# Patient Record
Sex: Female | Born: 1935 | Race: White | Hispanic: No | Smoking: Former smoker
Health system: Southern US, Community
[De-identification: ages and names within clinical notes are randomized; demographics above are authoritative.]

## PROBLEM LIST (undated history)

## (undated) DIAGNOSIS — F32A Depression, unspecified: Secondary | ICD-10-CM

## (undated) DIAGNOSIS — F329 Major depressive disorder, single episode, unspecified: Secondary | ICD-10-CM

## (undated) DIAGNOSIS — I471 Supraventricular tachycardia, unspecified: Secondary | ICD-10-CM

## (undated) DIAGNOSIS — I4719 Other supraventricular tachycardia: Secondary | ICD-10-CM

## (undated) DIAGNOSIS — F1021 Alcohol dependence, in remission: Secondary | ICD-10-CM

## (undated) DIAGNOSIS — I440 Atrioventricular block, first degree: Secondary | ICD-10-CM

## (undated) DIAGNOSIS — I219 Acute myocardial infarction, unspecified: Secondary | ICD-10-CM

## (undated) DIAGNOSIS — Q676 Pectus excavatum: Secondary | ICD-10-CM

## (undated) DIAGNOSIS — H353230 Exudative age-related macular degeneration, bilateral, stage unspecified: Secondary | ICD-10-CM

## (undated) DIAGNOSIS — Z9289 Personal history of other medical treatment: Secondary | ICD-10-CM

## (undated) DIAGNOSIS — M5136 Other intervertebral disc degeneration, lumbar region: Secondary | ICD-10-CM

## (undated) DIAGNOSIS — J449 Chronic obstructive pulmonary disease, unspecified: Secondary | ICD-10-CM

## (undated) DIAGNOSIS — E785 Hyperlipidemia, unspecified: Secondary | ICD-10-CM

## (undated) DIAGNOSIS — J309 Allergic rhinitis, unspecified: Secondary | ICD-10-CM

## (undated) DIAGNOSIS — D509 Iron deficiency anemia, unspecified: Secondary | ICD-10-CM

## (undated) DIAGNOSIS — I10 Essential (primary) hypertension: Secondary | ICD-10-CM

## (undated) DIAGNOSIS — R350 Frequency of micturition: Secondary | ICD-10-CM

## (undated) DIAGNOSIS — K449 Diaphragmatic hernia without obstruction or gangrene: Secondary | ICD-10-CM

## (undated) DIAGNOSIS — J189 Pneumonia, unspecified organism: Secondary | ICD-10-CM

## (undated) DIAGNOSIS — M1990 Unspecified osteoarthritis, unspecified site: Secondary | ICD-10-CM

## (undated) DIAGNOSIS — T7840XA Allergy, unspecified, initial encounter: Secondary | ICD-10-CM

## (undated) DIAGNOSIS — K219 Gastro-esophageal reflux disease without esophagitis: Secondary | ICD-10-CM

## (undated) DIAGNOSIS — Z86010 Personal history of colonic polyps: Secondary | ICD-10-CM

## (undated) DIAGNOSIS — H3530 Unspecified macular degeneration: Secondary | ICD-10-CM

## (undated) HISTORY — DX: Supraventricular tachycardia: I47.1

## (undated) HISTORY — DX: Personal history of colonic polyps: Z86.010

## (undated) HISTORY — DX: Chronic obstructive pulmonary disease, unspecified: J44.9

## (undated) HISTORY — DX: Other supraventricular tachycardia: I47.19

## (undated) HISTORY — DX: Gastro-esophageal reflux disease without esophagitis: K21.9

## (undated) HISTORY — PX: TONSILLECTOMY AND ADENOIDECTOMY: SUR1326

## (undated) HISTORY — PX: KNEE ARTHROSCOPY: SHX127

## (undated) HISTORY — DX: Pectus excavatum: Q67.6

## (undated) HISTORY — PX: CATARACT EXTRACTION W/ INTRAOCULAR LENS  IMPLANT, BILATERAL: SHX1307

## (undated) HISTORY — DX: Other intervertebral disc degeneration, lumbar region: M51.36

## (undated) HISTORY — DX: Acute myocardial infarction, unspecified: I21.9

## (undated) HISTORY — DX: Diaphragmatic hernia without obstruction or gangrene: K44.9

## (undated) HISTORY — PX: CARDIAC CATHETERIZATION: SHX172

## (undated) HISTORY — DX: Essential (primary) hypertension: I10

## (undated) HISTORY — DX: Iron deficiency anemia, unspecified: D50.9

## (undated) HISTORY — DX: Unspecified osteoarthritis, unspecified site: M19.90

## (undated) HISTORY — DX: Hyperlipidemia, unspecified: E78.5

## (undated) HISTORY — DX: Supraventricular tachycardia, unspecified: I47.10

## (undated) HISTORY — DX: Allergy, unspecified, initial encounter: T78.40XA

## (undated) HISTORY — DX: Allergic rhinitis, unspecified: J30.9

## (undated) HISTORY — DX: Atrioventricular block, first degree: I44.0

## (undated) HISTORY — DX: Alcohol dependence, in remission: F10.21

## (undated) HISTORY — DX: Unspecified macular degeneration: H35.30

---
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
## 1986-06-10 HISTORY — PX: ABDOMINAL HYSTERECTOMY: SHX81

## 1997-12-08 ENCOUNTER — Other Ambulatory Visit: Admission: RE | Admit: 1997-12-08 | Discharge: 1997-12-08 | Payer: Self-pay | Admitting: *Deleted

## 1997-12-14 ENCOUNTER — Other Ambulatory Visit: Admission: RE | Admit: 1997-12-14 | Discharge: 1997-12-14 | Payer: Self-pay | Admitting: Cardiology

## 1998-12-14 ENCOUNTER — Other Ambulatory Visit: Admission: RE | Admit: 1998-12-14 | Discharge: 1998-12-14 | Payer: Self-pay | Admitting: *Deleted

## 2000-01-07 ENCOUNTER — Other Ambulatory Visit: Admission: RE | Admit: 2000-01-07 | Discharge: 2000-01-07 | Payer: Self-pay | Admitting: *Deleted

## 2000-12-29 ENCOUNTER — Other Ambulatory Visit: Admission: RE | Admit: 2000-12-29 | Discharge: 2000-12-29 | Payer: Self-pay | Admitting: *Deleted

## 2003-01-17 HISTORY — PX: US ECHOCARDIOGRAPHY: HXRAD669

## 2005-03-06 ENCOUNTER — Ambulatory Visit: Payer: Self-pay | Admitting: Internal Medicine

## 2005-04-25 ENCOUNTER — Ambulatory Visit: Payer: Self-pay | Admitting: Internal Medicine

## 2005-05-09 ENCOUNTER — Ambulatory Visit: Payer: Self-pay | Admitting: Internal Medicine

## 2005-05-09 ENCOUNTER — Encounter (INDEPENDENT_AMBULATORY_CARE_PROVIDER_SITE_OTHER): Payer: Self-pay | Admitting: Specialist

## 2007-07-13 ENCOUNTER — Inpatient Hospital Stay (HOSPITAL_COMMUNITY): Admission: EM | Admit: 2007-07-13 | Discharge: 2007-07-15 | Payer: Self-pay | Admitting: Emergency Medicine

## 2009-01-26 ENCOUNTER — Encounter: Admission: RE | Admit: 2009-01-26 | Discharge: 2009-01-26 | Payer: Self-pay | Admitting: Cardiology

## 2009-03-08 HISTORY — PX: CARDIOVASCULAR STRESS TEST: SHX262

## 2010-03-12 ENCOUNTER — Ambulatory Visit: Payer: Self-pay | Admitting: Cardiology

## 2010-09-18 ENCOUNTER — Telehealth: Payer: Self-pay | Admitting: Cardiology

## 2010-09-18 MED ORDER — BUTABARBITAL SODIUM 30 MG PO TABS: 30.0000 mg | ORAL_TABLET | Freq: Three times a day (TID) | ORAL | Status: DC | PRN

## 2010-09-18 NOTE — Telephone Encounter (Signed)
Wants the number of pills in her prescription increased to 30 days. She said that the pharm.has only been giving her 14 pills. The med is butisol

## 2010-10-23 NOTE — H&P (Signed)
Alexandra Reyes, Alexandra Reyes              ACCOUNT NO.:  [id number]   MEDICAL RECORD NO.:  [id number]          PATIENT TYPE:  INP   LOCATION:  1413                         FACILITY:  [facility]   PHYSICIAN:  Cassell Clement, M.D. DATE OF BIRTH:  [birth year]   DATE OF ADMISSION:  07/13/2007  DATE OF DISCHARGE:                              HISTORY & PHYSICAL   CHIEF COMPLAINT:  Weakness, nausea and vomiting and diarrhea.   HISTORY:  This is a 77 year old Caucasian female who has been in  previously good health.  For the past week, she has had what she felt  was an upper respiratory infection with cough.  Then about 4 days ago,  she began experiencing diarrhea and over the weekend also had pernicious  nausea and vomiting.  She has had no fever.  She is not having any  sputum production.  She does have a past history of paroxysmal atrial  tachycardia.   HOME MEDS:  Are as follows:  1. Atenolol 25 mg b.i.d.  2. Lanoxin 0.125 mg daily.  3. Lasix 20 mg daily.  4. Fosamax 70 mg a week.  5. Prilosec 20 mg daily.  6. Simvastatin 20 mg at bedtime.   SOCIAL HISTORY:  Reveals that she is a widow.  She does not use alcohol  or tobacco.   FAMILY HISTORY:  Reveals that her father died at 52 of kidney failure.  Mother died of heart failure at 56.   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   PREVIOUS SURGERY:  Includes hysterectomy in 1988, and a lump of her  right breast in 1948, which was benign.   REVIEW OF SYSTEMS:  Otherwise unremarkable.  She does have a past  history of paroxysmal atrial tachycardia, which is quite infrequent.  Remainder of review of systems is negative in detail.  She has not  noticed any hematochezia or melena in association with the nausea,  vomiting and diarrhea.   PHYSICAL EXAMINATION:  VITAL SIGNS:  Her blood pressure is 154/76, pulse  of 62 regular, respirations are 22, O2 sat is 98%, temperature 96.5.  GENERAL APPEARANCE:  Reveals a weak-appearing woman in no acute  distress.  HEENT:  The pupils were equal and reactive.  Sclerae are clear.  The  jugular venous pressure is normal.  Carotids normal.  Thyroid normal.  CHEST:  Clear to percussion and auscultation.  HEART:  Reveals no murmur, gallop, rub or click.  ABDOMEN:  Soft.  Bowel sounds are present.  There is no  hepatosplenomegaly.  EXTREMITIES:  Showed no edema or phlebitis.  NEUROLOGIC:  Exam reveals that she is slightly slow in her thinking,  when compared usual but otherwise unchanged.   LAB WORK:  White count 11,900, hemoglobin 13.8, platelet count 475,000.  Serum sodium is 113, potassium 3.3, chloride 80, CO2 19, blood sugar  180, BUN 4, creatinine 0.65, calcium 8.4.  Digoxin level is less than  0.2.   Chest x-ray shows normal heart size include lung fields.  KUB is  unremarkable.   IMPRESSION:  1. Severe hyponatremia, probably secondary to pernicious nausea and      vomiting of uncertain etiology, probably secondary to viral      illness, rule out other causes of hyponatremia, such as      inappropriate secretion of a diuretic hormone.   DISPOSITION:  We are admitting her.  We are giving her IV saline.  We  are also going to replete her potassium, will get daily electrolytes.  We will have her on telemetry.  We will continue Lanoxin and will give  empiric Protonix.  Will hold her simvastatin for now.           ______________________________  Cassell Clement, M.D.     TB/MEDQ  D:  07/13/2007  T:  07/13/2007  Job:  [id number]

## 2010-10-23 NOTE — Discharge Summary (Signed)
Alexandra Reyes, Alexandra Reyes              ACCOUNT NO.:  [id number]   MEDICAL RECORD NO.:  [id number]          PATIENT TYPE:  INP   LOCATION:  1413                         FACILITY:  [facility]   PHYSICIAN:  Cassell Clement, M.D. DATE OF BIRTH:  05/02/1926   DATE OF ADMISSION:  07/13/2007  DATE OF DISCHARGE:  07/15/2007                               DISCHARGE SUMMARY   FINAL DIAGNOSES:  1. Severe hyponatremia.  2. Viral gastroenteritis with nausea and vomiting, resolved.  3. History of essential hypertension.  4. Hypercholesterolemia.  5. History of paroxysmal supraventricular tachycardia.   OPERATIONS PERFORMED:  None.   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  She  presented to the emergency room on the morning of July 13, 2007, and  was found to have a serum sodium of 113, potassium 3.3, chloride 80, CO2  19, blood sugar 180, BUN 4, creatinine 0.65, calcium 8.4.  Her digoxin  level was less than 0.2.  She had a history of having had a cough for  about a week and then began having diarrhea and nausea and vomiting over  the weekend, leaving her extremely weak and near-syncopal.   Physical exam on admission showed a blood pressure of 154/76, pulse of  62, regular, respirations 22, O2 saturation is 98%, temperature 96.5.  This is a weak-appearing woman in no acute distress.  HEENT:  Negative.  No scleral icterus.  LUNGS:  Clear.  HEART:  No murmur, gallop, rub or click.  ABDOMEN:  Soft bowel sounds.  No hepatosplenomegaly.  EXTREMITIES:  No edema or phlebitis.  NEUROLOGIC:  Her thinking is somewhat slow but otherwise no focal  findings.   Lab work was as noted above.  Her white count initially was 11,900,  hemoglobin 13.8.   HOSPITAL COURSE:  She was admitted to telemetry.  She was treated with  normal saline IV.  Her  potassium was also repleted.  She had no further  nausea and vomiting after admission.  By the day after admission her  serum sodium had increased from 113 to 130.  Potassium remained low and  she required further repletion.  Her activity was increased and she was  strong enough to walk in the hall and by July 15, 2007, sodium was up  to 131, potassium still low at 3.3, BUN was 4.  Blood pressure was  higher having been off Lasix and we are going to leave her off her  diuretics now because of her severe sodium depletion and we are going to  use Norvasc 5 mg daily to help with of blood pressure control.   The patient is being discharged on the following:  She will be on low-  sodium, heart-healthy diet.   She will be on:  1. Atenolol 25 mg twice a day.  2. Norvasc 5 mg daily for blood pressure.  3. K-Dur 20 mEq one twice a day.  4. Simvastatin 20 mg daily.  5. Aspirin 81 mg daily.  6. Lanoxin 0.125 mg daily.  7. Mucinex 600 mg twice a day p.r.n. for chest congestion.  8. Prilosec 20 mg daily.  9. Fosamax 70 mg weekly.  10.Iron 325 mg daily.  11.She is to hold off on daily Lasix but use it just p.r.n. for edema      or dyspnea.   She will be rechecked in 2 weeks and we will call her with an  appointment.  She will be seen for office visit and BMET.   CONDITION ON DISCHARGE:  Improved.           ______________________________  Cassell Clement, M.D.     TB/MEDQ  D:  07/15/2007  T:  07/15/2007  Job:  [id number]

## 2010-11-06 ENCOUNTER — Other Ambulatory Visit: Payer: Self-pay | Admitting: Cardiology

## 2010-11-07 NOTE — Telephone Encounter (Signed)
Med refill

## 2010-12-11 ENCOUNTER — Other Ambulatory Visit: Payer: Self-pay | Admitting: *Deleted

## 2010-12-11 DIAGNOSIS — F419 Anxiety disorder, unspecified: Secondary | ICD-10-CM

## 2010-12-11 NOTE — Telephone Encounter (Signed)
Refilled meds per fax request. Faxed signed rx back 

## 2010-12-16 MED ORDER — BUTABARBITAL SODIUM 30 MG PO TABS: 30.0000 mg | ORAL_TABLET | Freq: Every day | ORAL | Status: DC | PRN

## 2011-01-16 ENCOUNTER — Telehealth: Payer: Self-pay | Admitting: Cardiology

## 2011-01-16 NOTE — Telephone Encounter (Signed)
Wants to speak with RN before she schedules her 6 m f/u Appt. She thinks that she should have a CPE at the same time and wanted to ask you about that

## 2011-01-16 NOTE — Telephone Encounter (Signed)
Agree with plan 

## 2011-01-16 NOTE — Telephone Encounter (Signed)
Called patient and she has had increased tachycardia and a little more shortness of breath, has been drinking more tea.  Wanted to schedule her physical earlier than October.  Explained insurance would not cover before October, but that  Dr. Patty Sermons could see her before then for office visit.  She felt it was ok to wait until end of the month to be seen and scheduled appointment.  Advised to call back if worse.  Patient stated several times she was ok to wait and that she would call back if she needed to be seen before then.

## 2011-01-24 ENCOUNTER — Encounter: Payer: Self-pay | Admitting: Cardiology

## 2011-02-07 ENCOUNTER — Ambulatory Visit (INDEPENDENT_AMBULATORY_CARE_PROVIDER_SITE_OTHER): Payer: Medicare Other | Admitting: Cardiology

## 2011-02-07 ENCOUNTER — Ambulatory Visit
Admission: RE | Admit: 2011-02-07 | Discharge: 2011-02-07 | Disposition: A | Discharge status: Home / Self Care | Payer: Medicare Other | Source: Ambulatory Visit | Attending: Cardiology | Admitting: Cardiology

## 2011-02-07 ENCOUNTER — Encounter: Payer: Self-pay | Admitting: Cardiology

## 2011-02-07 ENCOUNTER — Other Ambulatory Visit: Payer: Self-pay | Admitting: *Deleted

## 2011-02-07 VITALS — BP 140/70 | HR 55 | Ht 63.5 in | Wt 150.6 lb

## 2011-02-07 DIAGNOSIS — E7800 Pure hypercholesterolemia, unspecified: Secondary | ICD-10-CM

## 2011-02-07 DIAGNOSIS — R0600 Dyspnea, unspecified: Secondary | ICD-10-CM

## 2011-02-07 DIAGNOSIS — R079 Chest pain, unspecified: Secondary | ICD-10-CM | POA: Insufficient documentation

## 2011-02-07 DIAGNOSIS — R0989 Other specified symptoms and signs involving the circulatory and respiratory systems: Secondary | ICD-10-CM

## 2011-02-07 DIAGNOSIS — I471 Supraventricular tachycardia, unspecified: Secondary | ICD-10-CM

## 2011-02-07 DIAGNOSIS — M1990 Unspecified osteoarthritis, unspecified site: Secondary | ICD-10-CM

## 2011-02-07 DIAGNOSIS — L299 Pruritus, unspecified: Secondary | ICD-10-CM

## 2011-02-07 LAB — BASIC METABOLIC PANEL
BUN: 11 mg/dL (ref 6–23)
CO2: 27 mEq/L (ref 19–32)
Chloride: 98 mEq/L (ref 96–112)
Creatinine, Ser: 0.5 mg/dL (ref 0.4–1.2)
Potassium: 5.5 mEq/L — ABNORMAL HIGH (ref 3.5–5.1)

## 2011-02-07 MED ORDER — SIMVASTATIN 20 MG PO TABS: 20.0000 mg | ORAL_TABLET | Freq: Every day | ORAL | Status: DC

## 2011-02-07 MED ORDER — HYDROCORTISONE VALERATE 0.2 % EX CREA: TOPICAL_CREAM | Freq: Two times a day (BID) | CUTANEOUS | Status: AC

## 2011-02-07 MED ORDER — FUROSEMIDE 20 MG PO TABS: 20.0000 mg | ORAL_TABLET | ORAL | Status: DC | PRN

## 2011-02-07 NOTE — Progress Notes (Signed)
Alexandra Reyes Date of Birth:  02/24/1926 [facility] [address]   [address] [place], [place]  [address] [phone]           Fax   [phone]  History of Present Illness: This pleasant 77 year old woman is seen for a work in office visit.  She has been concerned about herself.  She had an episode of prolonged supraventricular tachycardia several weeks ago.  She estimated her heart rate at about 180 and it lasted for several hours during the night.  It was associated with some aching of her left arm and some substernal chest tightness.  She does not have any history of known ischemic heart disease.  She had similar symptoms in 2010 and had a normal treadmill Cardiolite stress test in September 2010.  Her ejection fraction was 84%.  She's had a history of rapid weight gain and weight loss.  She has been using furosemide 20 mg on a p.r.n. Basis for fluid retention.  She has felt short of breath with exertion.  Her last echocardiogram was in 2004.  Current Outpatient Prescriptions  Medication Sig Dispense Refill  . amLODipine (NORVASC) 2.5 MG tablet Take 2.5 mg by mouth daily.        . Ascorbic Acid (VITAMIN C PO) Take by mouth.        Marland Kitchen aspirin 81 MG tablet Take 81 mg by mouth daily.        Marland Kitchen atenolol (TENORMIN) 25 MG tablet TAKE ONE TABLET BY MOUTH TWICE DAILY  100 tablet  PRN  . B Complex Vitamins (VITAMIN B COMPLEX PO) Take by mouth daily.        . butabarbital (BUTISOL) 30 MG tablet Take 30 mg by mouth 3 (three) times daily as needed.        . calcium carbonate (CALCIUM 600) 600 MG TABS Take 600 mg by mouth daily.        . digoxin (LANOXIN) 0.125 MG tablet Take 125 mcg by mouth daily.        . ferrous sulfate 325 (65 FE) MG tablet Take 325 mg by mouth daily with breakfast.        . ibuprofen (ADVIL,MOTRIN) 200 MG tablet Take 200 mg by mouth every 6 (six) hours as needed.        . simvastatin (ZOCOR) 20 MG tablet Take 1 tablet (20 mg total) by mouth at  bedtime.  90 tablet  3  . furosemide (LASIX) 20 MG tablet Take 1 tablet (20 mg total) by mouth as needed.  90 tablet  3  . hydrocortisone (WESTCORT) 0.2 % cream Apply topically 2 (two) times daily.  45 g  1  . Multiple Vitamins-Minerals (ICAPS PO) Take by mouth daily.          Allergies  Allergen Reactions  . Lisinopril     There is no problem list on file for this patient.   History  Smoking status  . Former Smoker  . Quit date: 01/24/2003  Smokeless tobacco  . Not on file    History  Alcohol Use No    Family History  Problem Relation Age of Onset  . Heart failure Mother   . Arthritis Mother   . Kidney failure Father   . Heart disease Father   . Hypertension Father     Review of Systems: Constitutional: no fever chills diaphoresis or fatigue or change in weight.  Head and neck: no hearing loss, no epistaxis, no  photophobia or visual disturbance. Respiratory: No cough, shortness of breath or wheezing. Cardiovascular: No chest pain peripheral edema, palpitations. Gastrointestinal: No abdominal distention, no abdominal pain, no change in bowel habits hematochezia or melena. Genitourinary: No dysuria, no frequency, no urgency, no nocturia. Musculoskeletal:No arthralgias, no back pain, no gait disturbance or myalgias. Neurological: No dizziness, no headaches, no numbness, no seizures, no syncope, no weakness, no tremors. Hematologic: No lymphadenopathy, no easy bruising. Psychiatric: No confusion, no hallucinations, no sleep disturbance.    Physical Exam: Filed Vitals:   02/07/11 1027  BP: 140/70  Pulse: 55  The general appearance reveals a well-developed well-nourished woman in no distress.Pupils equal and reactive.   Extraocular Movements are full.  There is no scleral icterus.  The mouth and pharynx are normal.  The neck is supple.  The carotids reveal no bruits.  The jugular venous pressure is normal.  The thyroid is not enlarged.  There is no  lymphadenopathy.  The chest is clear to percussion and auscultation. There are no rales or rhonchi. Expansion of the chest is symmetrical.  The precordium is quiet.  The first heart sound is normal.  The second heart sound is physiologically split.  There is no murmur gallop rub or click.  There is no abnormal lift or heave.  The abdomen is soft and nontender. Bowel sounds are normal. The liver and spleen are not enlarged. There Are no abdominal masses. There are no bruits.  The pedal pulses are good.  There is no phlebitis or edema.  There is no cyanosis or clubbing.  Strength is normal and symmetrical in all extremities.  There is no lateralizing weakness.  There are no sensory deficits.  The skin is warm and dry.  There is no rash.     Assessment / Plan: We are going to have her start taking her furosemide 20 mg every day instead of just p.r.n. To see if we can limit her wide shifts and fluid retention and decrease her dyspnea.  Her chest x-ray today is unremarkable.  We're sending her to the lab for blood work including a B. Natruretic peptide and a basal metabolic panel to look at potassium and a digoxin level to see if we could possibly increase her maintenance digoxin dose and help try to decrease the frequency of her episodes of tachycardia.  If her potassium is borderline low we will want to add potassium tablets to her regimen particularly if we go up on her digoxin dose.   She Will be rechecked in 3 months for followup office visit and lab work

## 2011-02-07 NOTE — Assessment & Plan Note (Signed)
The patient has had some chest tightness in association with her tachycardia and sometimes when she's not having tachycardia.  She does not have known ischemic heart disease and she had a normal treadmill Cardiolite stress test less than 2 years ago.

## 2011-02-07 NOTE — Assessment & Plan Note (Signed)
The patient has been more aware of shortness of breath and retention of fluid requiring Lasix.  She does not have any history of Ingested heart failure.  We're checking a basal natruretic peptide today as well as a chest x-ray.  The chest x-ray report has already returned and shows normal heart size and no CHF.

## 2011-02-08 ENCOUNTER — Telehealth: Payer: Self-pay | Admitting: *Deleted

## 2011-02-08 LAB — DIGOXIN LEVEL: Digoxin Level: 0.8 ng/mL (ref 0.8–2.0)

## 2011-02-08 NOTE — Telephone Encounter (Signed)
Message copied by Eugenia Pancoast on Fri Feb 08, 2011  5:23 PM ------      Message from: Cassell Clement      Created: Thu Feb 07, 2011  5:43 PM       The chest x-ray was normal

## 2011-02-08 NOTE — Telephone Encounter (Signed)
Advised of CXR resiults

## 2011-02-13 ENCOUNTER — Other Ambulatory Visit: Payer: Self-pay | Admitting: *Deleted

## 2011-02-13 DIAGNOSIS — I119 Hypertensive heart disease without heart failure: Secondary | ICD-10-CM

## 2011-02-13 DIAGNOSIS — R0600 Dyspnea, unspecified: Secondary | ICD-10-CM

## 2011-02-13 MED ORDER — FUROSEMIDE 20 MG PO TABS: 20.0000 mg | ORAL_TABLET | Freq: Every day | ORAL | Status: DC

## 2011-02-19 ENCOUNTER — Telehealth: Payer: Self-pay | Admitting: Cardiology

## 2011-02-19 ENCOUNTER — Telehealth: Payer: Self-pay | Admitting: *Deleted

## 2011-02-19 DIAGNOSIS — Z79899 Other long term (current) drug therapy: Secondary | ICD-10-CM

## 2011-02-19 NOTE — Telephone Encounter (Signed)
Advised of increase, will recheck at next ov

## 2011-02-19 NOTE — Progress Notes (Signed)
Advised of increase

## 2011-02-19 NOTE — Telephone Encounter (Signed)
Advised of labs 

## 2011-02-19 NOTE — Progress Notes (Signed)
Left message

## 2011-02-19 NOTE — Telephone Encounter (Signed)
Returning call back to nurse.  

## 2011-02-19 NOTE — Telephone Encounter (Signed)
Message copied by Burnell Blanks on Tue Feb 19, 2011  2:40 PM ------      Message from: Cassell Clement      Created: Sun Feb 17, 2011  8:10 AM       Please report to the patient.  The digoxin level is borderline low and I want the patient to increase her digoxin to 2 tablets on Monday, Wednesday, and Friday, and one tablet on the other days.  Hopefully, this . will decrease her episodes of supraventricular tachycardia.

## 2011-02-25 ENCOUNTER — Other Ambulatory Visit (INDEPENDENT_AMBULATORY_CARE_PROVIDER_SITE_OTHER): Payer: Medicare Other | Admitting: *Deleted

## 2011-02-25 DIAGNOSIS — E7800 Pure hypercholesterolemia, unspecified: Secondary | ICD-10-CM

## 2011-02-25 DIAGNOSIS — I119 Hypertensive heart disease without heart failure: Secondary | ICD-10-CM

## 2011-02-25 LAB — LIPID PANEL
HDL: 57.2 mg/dL (ref >39.00)
LDL Cholesterol: 106 mg/dL — ABNORMAL HIGH (ref 0–99)
Total CHOL/HDL Ratio: 3
Triglycerides: 135 mg/dL (ref 0.0–149.0)

## 2011-02-25 LAB — BASIC METABOLIC PANEL
CO2: 25 mEq/L (ref 19–32)
Calcium: 9 mg/dL (ref 8.4–10.5)
Chloride: 97 mEq/L (ref 96–112)
Glucose, Bld: 100 mg/dL — ABNORMAL HIGH (ref 70–99)
Sodium: 130 mEq/L — ABNORMAL LOW (ref 135–145)

## 2011-02-25 LAB — HEPATIC FUNCTION PANEL
Albumin: 4 g/dL (ref 3.5–5.2)
Total Bilirubin: 0.5 mg/dL (ref 0.3–1.2)

## 2011-02-27 ENCOUNTER — Telehealth: Payer: Self-pay | Admitting: *Deleted

## 2011-02-27 NOTE — Telephone Encounter (Signed)
Message copied by Burnell Blanks on Wed Feb 27, 2011  3:57 PM ------      Message from: Cassell Clement      Created: Tue Feb 26, 2011  3:00 PM       Please report.  The electrolytes are stable except a sodium is slightly low.  The potassium is fine.  The liver function studies are normal.  The LDL was borderline elevated at 106 And the triglycerides are normal.  Stay on present medication.

## 2011-02-27 NOTE — Telephone Encounter (Signed)
Message copied by Burnell Blanks on Wed Feb 27, 2011  3:59 PM ------      Message from: Cassell Clement      Created: Tue Feb 26, 2011  3:00 PM       Please report.  The electrolytes are stable except a sodium is slightly low.  The potassium is fine.  The liver function studies are normal.  The LDL was borderline elevated at 106 And the triglycerides are normal.  Stay on present medication.

## 2011-02-27 NOTE — Telephone Encounter (Signed)
Advised of labs 

## 2011-02-27 NOTE — Progress Notes (Signed)
Advised of labs 

## 2011-03-01 LAB — DIFFERENTIAL
Basophils Relative: 0
Eosinophils Absolute: 0
Eosinophils Relative: 0
Lymphs Abs: 1.6
Monocytes Absolute: 0.6
Monocytes Relative: 5
Neutrophils Relative %: 82 — ABNORMAL HIGH

## 2011-03-01 LAB — BASIC METABOLIC PANEL
BUN: 3 — ABNORMAL LOW
BUN: 4 — ABNORMAL LOW
CO2: 19
Calcium: 8.1 — ABNORMAL LOW
Chloride: 101
Chloride: 80 — ABNORMAL LOW
Creatinine, Ser: 0.5
Creatinine, Ser: 0.6
GFR calc Af Amer: >60
GFR calc non Af Amer: >60
GFR calc non Af Amer: >60
Glucose, Bld: 98
Potassium: 3.3 — ABNORMAL LOW
Potassium: 3.3 — ABNORMAL LOW
Sodium: 113 — CL

## 2011-03-01 LAB — SODIUM, URINE, RANDOM: Sodium, Ur: 45

## 2011-03-01 LAB — COMPREHENSIVE METABOLIC PANEL
ALT: 21
Albumin: 3.7
Alkaline Phosphatase: 70
GFR calc Af Amer: >60
Potassium: 3.6
Sodium: 114 — CL
Total Protein: 6.7

## 2011-03-01 LAB — URINALYSIS, ROUTINE W REFLEX MICROSCOPIC
Ketones, ur: >80 — AB
Nitrite: NEGATIVE
Protein, ur: 100 — AB
pH: 7.5

## 2011-03-01 LAB — CBC
HCT: 32.8 — ABNORMAL LOW
HCT: 39.5
Hemoglobin: 13.8
MCHC: 35
MCV: 84.2
MCV: 84.9
Platelets: 418 — ABNORMAL HIGH
Platelets: 437 — ABNORMAL HIGH
RBC: 4.69
RDW: 13.2
RDW: 13.4
WBC: 11.9 — ABNORMAL HIGH
WBC: 7.9
WBC: 8.2

## 2011-03-01 LAB — NA AND K (SODIUM & POTASSIUM), RAND UR: Potassium Urine: 34

## 2011-03-01 LAB — B-NATRIURETIC PEPTIDE (CONVERTED LAB): Pro B Natriuretic peptide (BNP): 88.4

## 2011-03-01 LAB — TSH: TSH: 2.32

## 2011-03-01 LAB — AMYLASE: Amylase: 27

## 2011-03-01 LAB — PROTIME-INR
INR: 1
Prothrombin Time: 13.1

## 2011-05-15 ENCOUNTER — Encounter: Payer: Self-pay | Admitting: Cardiology

## 2011-05-15 ENCOUNTER — Ambulatory Visit (INDEPENDENT_AMBULATORY_CARE_PROVIDER_SITE_OTHER): Payer: Medicare Other | Admitting: Cardiology

## 2011-05-15 ENCOUNTER — Other Ambulatory Visit: Payer: Medicare Other | Admitting: *Deleted

## 2011-05-15 VITALS — BP 140/78 | HR 60 | Ht 64.0 in | Wt 150.0 lb

## 2011-05-15 DIAGNOSIS — Z79899 Other long term (current) drug therapy: Secondary | ICD-10-CM

## 2011-05-15 DIAGNOSIS — R079 Chest pain, unspecified: Secondary | ICD-10-CM

## 2011-05-15 DIAGNOSIS — R0600 Dyspnea, unspecified: Secondary | ICD-10-CM

## 2011-05-15 DIAGNOSIS — I471 Supraventricular tachycardia: Secondary | ICD-10-CM

## 2011-05-15 DIAGNOSIS — R0609 Other forms of dyspnea: Secondary | ICD-10-CM

## 2011-05-15 DIAGNOSIS — E7800 Pure hypercholesterolemia, unspecified: Secondary | ICD-10-CM

## 2011-05-15 LAB — LIPID PANEL
Cholesterol: 212 mg/dL — ABNORMAL HIGH (ref 0–200)
HDL: 57.5 mg/dL (ref >39.00)
Triglycerides: 139 mg/dL (ref 0.0–149.0)
VLDL: 27.8 mg/dL (ref 0.0–40.0)

## 2011-05-15 LAB — BASIC METABOLIC PANEL
Chloride: 100 mEq/L (ref 96–112)
GFR: 84.9 mL/min (ref >60.00)
Potassium: 4.2 mEq/L (ref 3.5–5.1)
Sodium: 135 mEq/L (ref 135–145)

## 2011-05-15 LAB — HEPATIC FUNCTION PANEL
ALT: 20 U/L (ref 0–35)
Albumin: 3.8 g/dL (ref 3.5–5.2)
Total Protein: 6.4 g/dL (ref 6.0–8.3)

## 2011-05-15 NOTE — Assessment & Plan Note (Signed)
Patient has not had any chest pain or angina.

## 2011-05-15 NOTE — Patient Instructions (Signed)
Your physician recommends that you continue on your current medications as directed. Please refer to the Current Medication list given to you today.  Your physician wants you to follow-up in: 6 months. You will receive a reminder letter in the mail two months in advance. If you don't receive a letter, please call our office to schedule the follow-up appointment.  

## 2011-05-15 NOTE — Assessment & Plan Note (Signed)
The patient has a past history of exertional dyspnea secondary to diastolic dysfunction.  This has improved on daily Lasix.

## 2011-05-15 NOTE — Progress Notes (Signed)
Alexandra Reyes Date of Birth:  January 17, 1926 [facility] [address]   [address] [place], [place]  [address] [phone]           Fax   [phone]  History of Present Illness: This pleasant 77 year old woman is seen for a scheduled followup office visit.  She has a past history of paroxysmal supraventricular tachycardia.  I have any history of ischemic heart disease.  She had a normal treadmill Cardiolite stress test in September 2010.  She has not had cardiac catheterization.  Her ejection fraction at the time of her stress test was 84%.  Her last echocardiogram was in 2004 at the time of her echocardiogram in 2004 she had diastolic dysfunction and she had trace mitral regurgitation without definite findings of mitral valve prolapse.  Since last visit she's been doing well.  She has brief episodes of SVT approximately twice a week.  These usually occur at night awakening her from sleep.  If she sits up and burps or coughs hard she can breakthrough tachycardia.  The tachycardia will sometimes be brought on by stress or anxiety and so she takes a Butisol before she goes to church or before she would be in a crowd.  Current Outpatient Prescriptions  Medication Sig Dispense Refill  . amLODipine (NORVASC) 2.5 MG tablet Take 2.5 mg by mouth daily.        . Ascorbic Acid (VITAMIN C PO) Take by mouth.        Marland Kitchen aspirin 81 MG tablet Take 81 mg by mouth daily.        Marland Kitchen atenolol (TENORMIN) 25 MG tablet TAKE ONE TABLET BY MOUTH TWICE DAILY  100 tablet  PRN  . B Complex Vitamins (VITAMIN B COMPLEX PO) Take by mouth daily.        . butabarbital (BUTISOL) 30 MG tablet Take 30 mg by mouth 3 (three) times daily as needed.        . calcium carbonate (CALCIUM 600) 600 MG TABS Take 600 mg by mouth daily.        . digoxin (LANOXIN) 0.125 MG tablet Take 125 mcg by mouth daily. Except 2 on Mon, Wed, & Fri      . ferrous sulfate 325 (65 FE) MG tablet Take 325 mg by mouth daily with  breakfast.        . furosemide (LASIX) 20 MG tablet Take 1 tablet (20 mg total) by mouth daily.  90 tablet  0  . hydrocortisone (WESTCORT) 0.2 % cream Apply topically 2 (two) times daily.  45 g  1  . ibuprofen (ADVIL,MOTRIN) 200 MG tablet Take 200 mg by mouth every 6 (six) hours as needed.        . simvastatin (ZOCOR) 20 MG tablet Take 1 tablet (20 mg total) by mouth at bedtime.  90 tablet  3    Allergies  Allergen Reactions  . Lisinopril     Patient Active Problem List  Diagnoses  . Paroxysmal SVT (supraventricular tachycardia)  . Dyspnea  . Chest pain    History  Smoking status  . Former Smoker  . Quit date: 01/24/2003  Smokeless tobacco  . Not on file    History  Alcohol Use No    Family History  Problem Relation Age of Onset  . Heart failure Mother   . Arthritis Mother   . Kidney failure Father   . Heart disease Father   . Hypertension Father     Review  of Systems: Constitutional: no fever chills diaphoresis or fatigue or change in weight.  Head and neck: no hearing loss, no epistaxis, no photophobia or visual disturbance. Respiratory: No cough, shortness of breath or wheezing. Cardiovascular: No chest pain peripheral edema, palpitations. Gastrointestinal: No abdominal distention, no abdominal pain, no change in bowel habits hematochezia or melena. Genitourinary: No dysuria, no frequency, no urgency, no nocturia. Musculoskeletal:No arthralgias, no back pain, no gait disturbance or myalgias. Neurological: No dizziness, no headaches, no numbness, no seizures, no syncope, no weakness, no tremors. Hematologic: No lymphadenopathy, no easy bruising. Psychiatric: No confusion, no hallucinations, no sleep disturbance.    Physical Exam: Filed Vitals:   05/15/11 0929  BP: 140/78  Pulse: 60   general appearance reveals a well-developed well-nourished woman in no distress.Pupils equal and reactive.   Extraocular Movements are full.  There is no scleral icterus.  The  mouth and pharynx are normal.  The neck is supple.  The carotids reveal no bruits.  The jugular venous pressure is normal.  The thyroid is not enlarged.  There is no lymphadenopathy.  The head and neck exam reveals pupils equal and reactive.  Extraocular movements are full.  There is no scleral icterus.  The mouth and pharynx are normal.  The neck is supple.  The carotids reveal no bruits.  The jugular venous pressure is normal.  The  thyroid is not enlarged.  There is no lymphadenopathy.  The chest is clear to percussion and auscultation.  There are no rales or rhonchi.  Expansion of the chest is symmetrical.  The precordium is quiet.  The first heart sound is normal.  The second heart sound is physiologically split.  There is no murmur gallop rub or click.  There is no abnormal lift or heave.  The abdomen is soft and nontender.  The bowel sounds are normal.  The liver and spleen are not enlarged.  There are no abdominal masses.  There are no abdominal bruits.  Extremities reveal good pedal pulses.  There is no phlebitis or edema.  There is no cyanosis or clubbing.  Strength is normal and symmetrical in all extremities.  There is no lateralizing weakness.  There are no sensory deficits.  The skin is warm and dry.  There is no rash.     Assessment / Plan: Continue same medication and be rechecked in 6 months for followup office visit EKG fasting lipid panel hepatic function panel and basal metabolic panel.  Will also be establishing primary care relationship with Dr. Oliver Barre.

## 2011-05-15 NOTE — Assessment & Plan Note (Signed)
The patient is experiencing brief episodes about twice a week.  They're not associated with any chest pain or discomfort.

## 2011-05-16 ENCOUNTER — Telehealth: Payer: Self-pay | Admitting: *Deleted

## 2011-05-16 ENCOUNTER — Telehealth: Payer: Self-pay | Admitting: Cardiology

## 2011-05-16 LAB — DIGOXIN LEVEL: Digoxin Level: 2.1 ng/mL — ABNORMAL HIGH (ref 0.8–2.0)

## 2011-05-16 NOTE — Telephone Encounter (Signed)
New msg Pt was calling about test results she received letter in the mail

## 2011-05-16 NOTE — Telephone Encounter (Signed)
Advised of lab results 

## 2011-05-16 NOTE — Telephone Encounter (Signed)
Advised of labs 

## 2011-05-16 NOTE — Telephone Encounter (Signed)
Message copied by Burnell Blanks on Thu May 16, 2011  2:16 PM ------      Message from: Cassell Clement      Created: Thu May 16, 2011  1:26 PM       Please report.  The digoxin level is too high so decrease digoxin to one every day except 2 on Mondays only

## 2011-05-16 NOTE — Telephone Encounter (Signed)
Advised and mailed to patient 

## 2011-05-16 NOTE — Telephone Encounter (Signed)
Message copied by Burnell Blanks on Thu May 16, 2011  9:28 AM ------      Message from: Cassell Clement      Created: Wed May 15, 2011  8:44 PM       Please report.  The labs are stable.  Continue same meds.  Continue careful diet. Cholesterol still slightly high.

## 2011-05-18 ENCOUNTER — Encounter: Payer: Self-pay | Admitting: Internal Medicine

## 2011-05-18 DIAGNOSIS — M1990 Unspecified osteoarthritis, unspecified site: Secondary | ICD-10-CM | POA: Insufficient documentation

## 2011-05-18 DIAGNOSIS — K219 Gastro-esophageal reflux disease without esophagitis: Secondary | ICD-10-CM | POA: Insufficient documentation

## 2011-05-18 DIAGNOSIS — J449 Chronic obstructive pulmonary disease, unspecified: Secondary | ICD-10-CM | POA: Insufficient documentation

## 2011-05-18 DIAGNOSIS — M5136 Other intervertebral disc degeneration, lumbar region: Secondary | ICD-10-CM

## 2011-05-18 DIAGNOSIS — Z0000 Encounter for general adult medical examination without abnormal findings: Secondary | ICD-10-CM | POA: Insufficient documentation

## 2011-05-18 DIAGNOSIS — M51369 Other intervertebral disc degeneration, lumbar region without mention of lumbar back pain or lower extremity pain: Secondary | ICD-10-CM

## 2011-05-18 DIAGNOSIS — H3530 Unspecified macular degeneration: Secondary | ICD-10-CM | POA: Insufficient documentation

## 2011-05-18 DIAGNOSIS — E785 Hyperlipidemia, unspecified: Secondary | ICD-10-CM | POA: Insufficient documentation

## 2011-05-18 DIAGNOSIS — M810 Age-related osteoporosis without current pathological fracture: Secondary | ICD-10-CM | POA: Insufficient documentation

## 2011-05-18 DIAGNOSIS — D509 Iron deficiency anemia, unspecified: Secondary | ICD-10-CM

## 2011-05-18 DIAGNOSIS — K8020 Calculus of gallbladder without cholecystitis without obstruction: Secondary | ICD-10-CM | POA: Insufficient documentation

## 2011-05-18 DIAGNOSIS — Q676 Pectus excavatum: Secondary | ICD-10-CM | POA: Insufficient documentation

## 2011-05-18 DIAGNOSIS — I10 Essential (primary) hypertension: Secondary | ICD-10-CM | POA: Insufficient documentation

## 2011-05-18 HISTORY — DX: Pectus excavatum: Q67.6

## 2011-05-18 HISTORY — DX: Essential (primary) hypertension: I10

## 2011-05-18 HISTORY — DX: Hyperlipidemia, unspecified: E78.5

## 2011-05-18 HISTORY — DX: Other intervertebral disc degeneration, lumbar region: M51.36

## 2011-05-18 HISTORY — DX: Other intervertebral disc degeneration, lumbar region without mention of lumbar back pain or lower extremity pain: M51.369

## 2011-05-18 HISTORY — DX: Iron deficiency anemia, unspecified: D50.9

## 2011-05-22 ENCOUNTER — Encounter: Payer: Self-pay | Admitting: Internal Medicine

## 2011-05-22 ENCOUNTER — Other Ambulatory Visit (INDEPENDENT_AMBULATORY_CARE_PROVIDER_SITE_OTHER): Payer: Medicare Other

## 2011-05-22 ENCOUNTER — Ambulatory Visit (INDEPENDENT_AMBULATORY_CARE_PROVIDER_SITE_OTHER): Payer: Medicare Other | Admitting: Internal Medicine

## 2011-05-22 VITALS — BP 152/84 | HR 67 | Temp 97.4°F | Ht 64.0 in | Wt 152.5 lb

## 2011-05-22 DIAGNOSIS — D509 Iron deficiency anemia, unspecified: Secondary | ICD-10-CM

## 2011-05-22 DIAGNOSIS — R5381 Other malaise: Secondary | ICD-10-CM

## 2011-05-22 DIAGNOSIS — F329 Major depressive disorder, single episode, unspecified: Secondary | ICD-10-CM

## 2011-05-22 DIAGNOSIS — I10 Essential (primary) hypertension: Secondary | ICD-10-CM

## 2011-05-22 DIAGNOSIS — J309 Allergic rhinitis, unspecified: Secondary | ICD-10-CM | POA: Insufficient documentation

## 2011-05-22 DIAGNOSIS — R5383 Other fatigue: Secondary | ICD-10-CM

## 2011-05-22 DIAGNOSIS — F1021 Alcohol dependence, in remission: Secondary | ICD-10-CM | POA: Insufficient documentation

## 2011-05-22 HISTORY — DX: Allergic rhinitis, unspecified: J30.9

## 2011-05-22 HISTORY — DX: Alcohol dependence, in remission: F10.21

## 2011-05-22 LAB — CBC WITH DIFFERENTIAL/PLATELET
Basophils Absolute: 0.1 10*3/uL (ref 0.0–0.1)
HCT: 40.5 % (ref 36.0–46.0)
Hemoglobin: 13.9 g/dL (ref 12.0–15.0)
Lymphs Abs: 2 10*3/uL (ref 0.7–4.0)
MCHC: 34.3 g/dL (ref 30.0–36.0)
Monocytes Relative: 10.3 % (ref 3.0–12.0)
Neutro Abs: 5.5 10*3/uL (ref 1.4–7.7)
RDW: 14 % (ref 11.5–14.6)

## 2011-05-22 NOTE — Progress Notes (Signed)
Subjective:    Patient ID: Alexandra Reyes, female    DOB: 11/19/1925, 77 y.o.   MRN: [id number]  HPI  Here to establish as new pt;  Overall doing ok;  Pt denies CP, worsening SOB, DOE, wheezing, orthopnea, PND, worsening LE edema, palpitations, dizziness or syncope.  Pt denies neurological change such as new Headache, facial or extremity weakness.  Pt denies polydipsia, polyuria, or low sugar symptoms. Pt states overall good compliance with treatment and medications, good tolerability, and trying to follow lower cholesterol diet.  Pt denies worsening depressive symptoms, suicidal ideation or panic. No fever, wt loss, night sweats, loss of appetite, or other constitutional symptoms.  Pt states good ability with ADL's, low fall risk, home safety reviewed and adequate, no significant changes in hearing or vision, and occasionally active with exercise. BP usually 140 or less at home.  Denies worsening depressive symptoms, suicidal ideation, or panic. Does have sense of ongoing fatigue, but denies signficant hypersomnolence.  Does have several wks ongoing nasal allergy symptoms with clear congestion, itch and sneeze, without fever, pain, ST, cough or wheezing.  No overt bleeding or bruising. Past Medical History  Diagnosis Date  . PAT (paroxysmal atrial tachycardia)   . DOE (dyspnea on exertion)   . Chest pressure   . Macular degeneration   . OA (osteoarthritis)   . Hiatal hernia   . GERD (gastroesophageal reflux disease)   . Cholelithiasis   . Dyspnea   . COPD (chronic obstructive pulmonary disease)   . HTN (hypertension) 05/18/2011  . Hyperlipidemia 05/18/2011  . Osteoporosis 05/18/2011  . Anemia, iron deficiency 05/18/2011  . DDD (degenerative disc disease), lumbar 05/18/2011  . Pectus excavatum 05/18/2011  . Allergy   . Allergic rhinitis, cause unspecified 05/22/2011  . Alcohol dependence in remission 05/22/2011   Past Surgical History  Procedure Date  . Tonsillectomy and adenoidectomy   .  Breast lumpectomy   . US echocardiography 01/17/2003    EF 55-60%  . Cardiovascular stress test 03/08/2009    EF 84%  . Abdominal hysterectomy 1988  . Breast biopsy 1948    reports that she quit smoking about 8 years ago. She does not have any smokeless tobacco history on file. She reports that she does not drink alcohol or use illicit drugs. family history includes Arthritis in her mother; Heart disease in her father; Heart failure in her mother; Hypertension in her father; and Kidney failure in her father. Allergies  Allergen Reactions  . Lisinopril    Current Outpatient Prescriptions on File Prior to Visit  Medication Sig Dispense Refill  . amLODipine (NORVASC) 2.5 MG tablet Take 2.5 mg by mouth daily.        . Ascorbic Acid (VITAMIN C PO) Take by mouth.        Marland Kitchen aspirin 81 MG tablet Take 81 mg by mouth daily.        Marland Kitchen atenolol (TENORMIN) 25 MG tablet TAKE ONE TABLET BY MOUTH TWICE DAILY  100 tablet  PRN  . B Complex Vitamins (VITAMIN B COMPLEX PO) Take by mouth daily.        . butabarbital (BUTISOL) 30 MG tablet Take 30 mg by mouth 3 (three) times daily as needed.        . calcium carbonate (CALCIUM 600) 600 MG TABS Take 600 mg by mouth daily.        . digoxin (LANOXIN) 0.125 MG tablet Take 125 mcg by mouth daily. Except 2 on Mon, Wed, & Fri      .  ferrous sulfate 325 (65 FE) MG tablet Take 325 mg by mouth daily with breakfast.        . furosemide (LASIX) 20 MG tablet Take 1 tablet (20 mg total) by mouth daily.  90 tablet  0  . hydrocortisone (WESTCORT) 0.2 % cream Apply topically 2 (two) times daily.  45 g  1  . ibuprofen (ADVIL,MOTRIN) 200 MG tablet Take 200 mg by mouth every 6 (six) hours as needed.        . simvastatin (ZOCOR) 20 MG tablet Take 1 tablet (20 mg total) by mouth at bedtime.  90 tablet  3   Review of Systems Review of Systems  Constitutional: Negative for diaphoresis and unexpected weight change.  HENT: Negative for drooling and tinnitus.   Eyes: Negative for  photophobia and visual disturbance.  Respiratory: Negative for choking and stridor.   Gastrointestinal: Negative for vomiting and blood in stool.  Genitourinary: Negative for hematuria and decreased urine volume.  Musculoskeletal: Negative for gait problem.  Skin: Negative for color change and wound.  Neurological: Negative for tremors and numbness.  Psychiatric/Behavioral: Negative for decreased concentration. The patient is not hyperactive.       Objective:   Physical Exam BP 152/84  Pulse 67  Temp(Src) 97.4 F (36.3 C) (Oral)  Ht 5\' 4"  (1.626 m)  Wt 152 lb 8 oz (69.174 kg)  BMI 26.18 kg/m2  SpO2 94% Physical Exam  VS noted Constitutional: Pt appears well-developed and well-nourished.  HENT: Head: Normocephalic.  Right Ear: External ear normal.  Left Ear: External ear normal.  Eyes: Conjunctivae and EOM are normal. Pupils are equal, round, and reactive to light.  Neck: Normal range of motion. Neck supple.  Cardiovascular: Normal rate and regular rhythm.   Pulmonary/Chest: Effort normal and breath sounds normal.  Abd:  Soft, NT, non-distended, + BS Neurological: Pt is alert. No cranial nerve deficit.  Skin: Skin is warm. No erythema.  Psychiatric: Pt behavior is normal. Thought content normal.     Assessment & Plan:

## 2011-05-22 NOTE — Patient Instructions (Signed)
Continue all other medications as before Please go to LAB in the Basement for the blood and/or urine tests to be done today Please call the phone number [phone] (the [employer]Tree System) for results of testing in 2-3 days;  When calling, simply dial the number, and when prompted enter the MRN number above (the Medical Record Number) and the # key, then the message should start. Please return in 1 year for your yearly visit, or sooner if needed, with Lab testing done 3-5 days before  

## 2011-05-23 LAB — TSH: TSH: 1.88 u[IU]/mL (ref 0.35–5.50)

## 2011-05-26 ENCOUNTER — Encounter: Payer: Self-pay | Admitting: Internal Medicine

## 2011-05-26 NOTE — Assessment & Plan Note (Signed)
No overt bleeding or bruising,  For f/u labs today,  to f/u any worsening symptoms or concerns

## 2011-05-26 NOTE — Assessment & Plan Note (Signed)
stable overall by hx and exam, most recent data reviewed with pt, and pt to continue medical treatment as before  Lab Results  Component Value Date   WBC 8.7 05/22/2011   HGB 13.9 05/22/2011   HCT 40.5 05/22/2011   PLT 350.0 05/22/2011   GLUCOSE 101* 05/15/2011   CHOL 212* 05/15/2011   TRIG 139.0 05/15/2011   HDL 57.50 05/15/2011   LDLDIRECT 129.2 05/15/2011   LDLCALC 106* 02/25/2011   ALT 20 05/15/2011   AST 22 05/15/2011   NA 135 05/15/2011   K 4.2 05/15/2011   CL 100 05/15/2011   CREATININE 0.7 05/15/2011   BUN 12 05/15/2011   CO2 28 05/15/2011   TSH 1.88 05/22/2011   INR 1.0 07/13/2007   HGBA1C  Value: 5.8 (NOTE)   The ADA recommends the following therapeutic goals for glycemic   control related to Hgb A1C measurement:   Goal of Therapy:   < 7.0% Hgb A1C   Action Suggested:  > 8.0% Hgb A1C   Ref:  Diabetes Care, 22, Suppl. 1, 1999 07/13/2007

## 2011-05-26 NOTE — Assessment & Plan Note (Signed)
Ok for [employer] or allegra prn,  to f/u any worsening symptoms or concerns

## 2011-05-26 NOTE — Assessment & Plan Note (Signed)
Etiology unclear, Exam otherwise benign, to check labs as documented, follow with expectant management  

## 2011-05-26 NOTE — Assessment & Plan Note (Signed)
>>  ASSESSMENT AND PLAN FOR HTN (HYPERTENSION) WRITTEN ON 05/26/2011 11:43 AM BY [name], [name], MD  Pt states BP at home is normally < 140/90 and today would be unsusual, to Continue all other medications as before, f/u BP at home and next visit

## 2011-05-26 NOTE — Assessment & Plan Note (Signed)
Pt states BP at home is normally < 140/90 and today would be unsusual, to Continue all other medications as before, f/u BP at home and next visit

## 2011-06-19 DIAGNOSIS — F4323 Adjustment disorder with mixed anxiety and depressed mood: Secondary | ICD-10-CM | POA: Diagnosis not present

## 2011-06-21 DIAGNOSIS — H35059 Retinal neovascularization, unspecified, unspecified eye: Secondary | ICD-10-CM | POA: Diagnosis not present

## 2011-06-21 DIAGNOSIS — H353290 Exudative age-related macular degeneration, unspecified eye, stage unspecified: Secondary | ICD-10-CM | POA: Diagnosis not present

## 2011-07-10 DIAGNOSIS — F4323 Adjustment disorder with mixed anxiety and depressed mood: Secondary | ICD-10-CM | POA: Diagnosis not present

## 2011-07-31 DIAGNOSIS — F4323 Adjustment disorder with mixed anxiety and depressed mood: Secondary | ICD-10-CM | POA: Diagnosis not present

## 2011-08-05 DIAGNOSIS — Z1231 Encounter for screening mammogram for malignant neoplasm of breast: Secondary | ICD-10-CM | POA: Diagnosis not present

## 2011-08-26 ENCOUNTER — Other Ambulatory Visit: Payer: Self-pay | Admitting: *Deleted

## 2011-08-26 DIAGNOSIS — I471 Supraventricular tachycardia: Secondary | ICD-10-CM

## 2011-08-26 NOTE — Telephone Encounter (Signed)
Refills on atenolol and butisol

## 2011-08-27 MED ORDER — BUTABARBITAL SODIUM 30 MG PO TABS: 30.0000 mg | ORAL_TABLET | Freq: Three times a day (TID) | ORAL | Status: DC | PRN

## 2011-08-27 MED ORDER — ATENOLOL 25 MG PO TABS: 25.0000 mg | ORAL_TABLET | Freq: Two times a day (BID) | ORAL | Status: DC

## 2011-08-28 DIAGNOSIS — F4323 Adjustment disorder with mixed anxiety and depressed mood: Secondary | ICD-10-CM | POA: Diagnosis not present

## 2011-09-18 DIAGNOSIS — F4323 Adjustment disorder with mixed anxiety and depressed mood: Secondary | ICD-10-CM | POA: Diagnosis not present

## 2011-09-20 DIAGNOSIS — H35059 Retinal neovascularization, unspecified, unspecified eye: Secondary | ICD-10-CM | POA: Diagnosis not present

## 2011-09-20 DIAGNOSIS — H353290 Exudative age-related macular degeneration, unspecified eye, stage unspecified: Secondary | ICD-10-CM | POA: Diagnosis not present

## 2011-09-20 DIAGNOSIS — H43819 Vitreous degeneration, unspecified eye: Secondary | ICD-10-CM | POA: Diagnosis not present

## 2011-09-20 DIAGNOSIS — H43399 Other vitreous opacities, unspecified eye: Secondary | ICD-10-CM | POA: Diagnosis not present

## 2011-09-23 ENCOUNTER — Encounter: Payer: Self-pay | Admitting: Cardiology

## 2011-09-23 DIAGNOSIS — M949 Disorder of cartilage, unspecified: Secondary | ICD-10-CM | POA: Diagnosis not present

## 2011-09-23 DIAGNOSIS — M899 Disorder of bone, unspecified: Secondary | ICD-10-CM | POA: Diagnosis not present

## 2011-10-02 ENCOUNTER — Encounter: Payer: Self-pay | Admitting: Internal Medicine

## 2011-10-08 ENCOUNTER — Other Ambulatory Visit: Payer: Self-pay | Admitting: Cardiology

## 2011-10-08 MED ORDER — DIGOXIN 125 MCG PO TABS: 125.0000 ug | ORAL_TABLET | Freq: Every day | ORAL | Status: DC

## 2011-10-14 ENCOUNTER — Other Ambulatory Visit: Payer: Self-pay | Admitting: *Deleted

## 2011-10-14 MED ORDER — AMLODIPINE BESYLATE 2.5 MG PO TABS: 2.5000 mg | ORAL_TABLET | Freq: Every day | ORAL | Status: DC

## 2011-10-14 NOTE — Telephone Encounter (Signed)
Refilled amlodipine 

## 2011-10-16 DIAGNOSIS — F4323 Adjustment disorder with mixed anxiety and depressed mood: Secondary | ICD-10-CM | POA: Diagnosis not present

## 2011-11-06 DIAGNOSIS — F4329 Adjustment disorder with other symptoms: Secondary | ICD-10-CM | POA: Diagnosis not present

## 2011-11-11 ENCOUNTER — Ambulatory Visit (INDEPENDENT_AMBULATORY_CARE_PROVIDER_SITE_OTHER): Payer: Medicare Other | Admitting: Cardiology

## 2011-11-11 ENCOUNTER — Encounter: Payer: Self-pay | Admitting: Cardiology

## 2011-11-11 VITALS — BP 112/64 | HR 54 | Ht 64.0 in | Wt 150.0 lb

## 2011-11-11 DIAGNOSIS — I471 Supraventricular tachycardia: Secondary | ICD-10-CM | POA: Diagnosis not present

## 2011-11-11 DIAGNOSIS — R0989 Other specified symptoms and signs involving the circulatory and respiratory systems: Secondary | ICD-10-CM | POA: Diagnosis not present

## 2011-11-11 DIAGNOSIS — E785 Hyperlipidemia, unspecified: Secondary | ICD-10-CM | POA: Diagnosis not present

## 2011-11-11 DIAGNOSIS — R0600 Dyspnea, unspecified: Secondary | ICD-10-CM

## 2011-11-11 DIAGNOSIS — R0609 Other forms of dyspnea: Secondary | ICD-10-CM | POA: Diagnosis not present

## 2011-11-11 DIAGNOSIS — R5383 Other fatigue: Secondary | ICD-10-CM

## 2011-11-11 DIAGNOSIS — I119 Hypertensive heart disease without heart failure: Secondary | ICD-10-CM | POA: Diagnosis not present

## 2011-11-11 MED ORDER — FUROSEMIDE 40 MG PO TABS: 40.0000 mg | ORAL_TABLET | Freq: Every day | ORAL | Status: DC

## 2011-11-11 NOTE — Progress Notes (Signed)
Alexandra Reyes Date of Birth:  [birth year] [facility] [address] [place], [place]  [address] [phone]         Fax   [phone]  History of Present Illness: This pleasant 78 year old widowed woman is seen for a scheduled 6 month followup office visit.  He has a past history of diastolic dysfunction with dyspnea comment and a history of paroxysmal supraventricular tachycardia.  She does not have any history of ischemic heart disease.  She had a normal treadmill Cardiolite stress test in 2010.  She has not had cardiac catheterization.  Echocardiogram in 2004 showed diastolic dysfunction and trace mitral regurgitation.  Current Outpatient Prescriptions  Medication Sig Dispense Refill  . amLODipine (NORVASC) 2.5 MG tablet Take 1 tablet (2.5 mg total) by mouth daily.  90 tablet  3  . Ascorbic Acid (VITAMIN C PO) Take by mouth.        Marland Kitchen aspirin 81 MG tablet Take 81 mg by mouth daily.        Marland Kitchen atenolol (TENORMIN) 25 MG tablet Take 1 tablet (25 mg total) by mouth 2 (two) times daily.  100 tablet  PRN  . B Complex Vitamins (VITAMIN B COMPLEX PO) Take by mouth daily.        . butabarbital (BUTISOL) 30 MG tablet Take 30 mg by mouth daily as needed.      . calcium carbonate (CALCIUM 600) 600 MG TABS Take 600 mg by mouth daily.        . digoxin (LANOXIN) 0.125 MG tablet Take 125 mcg by mouth daily. Except 2 on Mon.      . ferrous sulfate 325 (65 FE) MG tablet Take 325 mg by mouth daily with breakfast.        . furosemide (LASIX) 40 MG tablet Take 1 tablet (40 mg total) by mouth daily.  90 tablet  1  . hydrocortisone (WESTCORT) 0.2 % cream Apply topically 2 (two) times daily.  45 g  1  . ibuprofen (ADVIL,MOTRIN) 200 MG tablet Take 200 mg by mouth every 6 (six) hours as needed.        . simvastatin (ZOCOR) 20 MG tablet Take 1 tablet (20 mg total) by mouth at bedtime.  90 tablet  3  . vitamin E 200 UNIT capsule Take 200 Units by mouth daily.      Marland Kitchen DISCONTD: digoxin  (LANOXIN) 0.125 MG tablet Take 1 tablet (125 mcg total) by mouth daily. Except 2 on Mon, Wed, & Fri  42 tablet  5  . DISCONTD: furosemide (LASIX) 20 MG tablet Take 1 tablet (20 mg total) by mouth daily.  90 tablet  0  . DISCONTD: butabarbital (BUTISOL SODIUM) 30 MG tablet Take 1 tablet (30 mg total) by mouth 3 (three) times daily as needed.  30 tablet  1  . DISCONTD: butabarbital (BUTISOL SODIUM) 30 MG tablet Take 1 tablet (30 mg total) by mouth daily as needed.  30 tablet  5  . DISCONTD: butabarbital (BUTISOL) 30 MG tablet Take 1 tablet (30 mg total) by mouth 3 (three) times daily as needed.  100 tablet  3    Allergies  Allergen Reactions  . Lisinopril     Patient Active Problem List  Diagnoses  . Paroxysmal SVT (supraventricular tachycardia)  . Dyspnea  . Chest pain  . Preventative health care  . HTN (hypertension)  . Hyperlipidemia  . Osteoporosis  . Anemia, iron deficiency  . GERD (gastroesophageal reflux disease)  . DDD (degenerative  disc disease), lumbar  . Pectus excavatum  . Macular degeneration  . OA (osteoarthritis)  . Cholelithiasis  . COPD (chronic obstructive pulmonary disease)  . Allergic rhinitis, cause unspecified  . Alcohol dependence in remission  . Fatigue  . Depression    History  Smoking status  . Former Smoker  . Quit date: 01/24/2003  Smokeless tobacco  . Not on file    History  Alcohol Use No    Family History  Problem Relation Age of Onset  . Heart failure Mother   . Arthritis Mother   . Kidney failure Father   . Heart disease Father   . Hypertension Father     Review of Systems: Constitutional: no fever chills diaphoresis or fatigue or change in weight.  Head and neck: no hearing loss, no epistaxis, no photophobia or visual disturbance. Respiratory: No cough, shortness of breath or wheezing. Cardiovascular: No chest pain peripheral edema, palpitations. Gastrointestinal: No abdominal distention, no abdominal pain, no change in  bowel habits hematochezia or melena. Genitourinary: No dysuria, no frequency, no urgency, no nocturia. Musculoskeletal:No arthralgias, no back pain, no gait disturbance or myalgias. Neurological: No dizziness, no headaches, no numbness, no seizures, no syncope, no weakness, no tremors. Hematologic: No lymphadenopathy, no easy bruising. Psychiatric: No confusion, no hallucinations, no sleep disturbance.    Physical Exam: Filed Vitals:   11/11/11 1058  BP: 112/64  Pulse: 54   the general appearance reveals a well-developed well-nourished woman in no distress.The head and neck exam reveals pupils equal and reactive.  Extraocular movements are full.  There is no scleral icterus.  The mouth and pharynx are normal.  The neck is supple.  The carotids reveal no bruits.  The jugular venous pressure is normal.  The  thyroid is not enlarged.  There is no lymphadenopathy.  The chest is clear to percussion and auscultation.  There are no rales or rhonchi.  Expansion of the chest is symmetrical.  The precordium is quiet.  The first heart sound is normal.  The second heart sound is physiologically split.  There is no murmur gallop rub or click.  There is no abnormal lift or heave.  The abdomen is soft and nontender.  The bowel sounds are normal.  The liver and spleen are not enlarged.  There are no abdominal masses.  There are no abdominal bruits.  Extremities reveal good pedal pulses.  There is no phlebitis or edema.  There is no cyanosis or clubbing.  Strength is normal and symmetrical in all extremities.  There is no lateralizing weakness.  There are no sensory deficits.  The skin is warm and dry.  There is no rash.  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  These changes are not significantly changed from prior tracing in 2011   Assessment / Plan: Continue same  medication except we are increasing the furosemide to 40 mg daily.  We will see if this helps her dyspnea and decreases her frequency of nocturnal SVT.  Recheck in 2 months for a basal metabolic panel in 6 months for fasting lipid panel CBC hepatic function panel and nasal metabolic panel.

## 2011-11-11 NOTE — Assessment & Plan Note (Signed)
The patient continues to have episodes of self-limited runs of supraventricular tachycardia.  In January she had an episode after she drank some sweet tea which contained caffeine.  In early May she had 3 episodes in 5 days.  Since then she's had no further episodes.  Her severe episodes occur at night and are associated with dyspnea.  The patient's last chest x-ray was 02/07/11 and showed normal heart size and clear lungs at that time.

## 2011-11-11 NOTE — Assessment & Plan Note (Signed)
Patient has a history of hyperlipidemia and is on simvastatin 20 mg daily.  She is not having any myalgias.  We will plan to recheck lipids in about 6 months

## 2011-11-11 NOTE — Assessment & Plan Note (Signed)
The patient has exertional dyspnea.  She also has dyspnea at night and has two-pillow orthopnea.  He has been taking furosemide 20 mg daily and her renal function has been normal.  We will increase her furosemide to 40 mg daily.  We will check a BMET in 2 months.

## 2011-11-11 NOTE — Assessment & Plan Note (Signed)
The patient does complain of lack of energy and easy fatigue.  Thyroid function studies have been normal in the past.  She is not having any change in weight or evidence of serious systemic illness.  We will plan to recheck a CBC at her next visit

## 2011-11-11 NOTE — Patient Instructions (Addendum)
Your physician wants you to follow-up in: 6 months  You will receive a reminder letter in the mail two months in advance. If you don't receive a letter, please call our office to schedule the follow-up appointment.   Your physician has recommended you make the following change in your medication:   Increase lasix from 20 mg to 40 mg daily   Your physician recommends that you return for a FASTING lipid profile: 6 months lp, bmet, cbc, hfp  PT CALLED AFTER LEAVING, MSG LEFT ON VM THAT SHE NEEDS 2 MONTH BMET FOR INCREASE IN LASIX, NUMBER PROVIDED FOR RETURN QUESTIONS. APP DATE GIVEN FOR NON FASTING LAB.

## 2011-11-19 NOTE — Progress Notes (Signed)
Addended by: Reine Just on: 11/19/2011 05:08 PM   Modules accepted: Orders

## 2011-11-27 DIAGNOSIS — F4329 Adjustment disorder with other symptoms: Secondary | ICD-10-CM | POA: Diagnosis not present

## 2011-12-20 DIAGNOSIS — H43819 Vitreous degeneration, unspecified eye: Secondary | ICD-10-CM | POA: Diagnosis not present

## 2011-12-20 DIAGNOSIS — H35059 Retinal neovascularization, unspecified, unspecified eye: Secondary | ICD-10-CM | POA: Diagnosis not present

## 2011-12-20 DIAGNOSIS — H353290 Exudative age-related macular degeneration, unspecified eye, stage unspecified: Secondary | ICD-10-CM | POA: Diagnosis not present

## 2011-12-20 DIAGNOSIS — H43399 Other vitreous opacities, unspecified eye: Secondary | ICD-10-CM | POA: Diagnosis not present

## 2011-12-25 DIAGNOSIS — F4329 Adjustment disorder with other symptoms: Secondary | ICD-10-CM | POA: Diagnosis not present

## 2012-01-14 ENCOUNTER — Other Ambulatory Visit (INDEPENDENT_AMBULATORY_CARE_PROVIDER_SITE_OTHER): Payer: Medicare Other

## 2012-01-14 DIAGNOSIS — R0989 Other specified symptoms and signs involving the circulatory and respiratory systems: Secondary | ICD-10-CM

## 2012-01-14 DIAGNOSIS — I471 Supraventricular tachycardia, unspecified: Secondary | ICD-10-CM

## 2012-01-14 DIAGNOSIS — Z79899 Other long term (current) drug therapy: Secondary | ICD-10-CM | POA: Diagnosis not present

## 2012-01-14 DIAGNOSIS — R079 Chest pain, unspecified: Secondary | ICD-10-CM | POA: Diagnosis not present

## 2012-01-14 DIAGNOSIS — R0609 Other forms of dyspnea: Secondary | ICD-10-CM

## 2012-01-14 DIAGNOSIS — E7800 Pure hypercholesterolemia, unspecified: Secondary | ICD-10-CM | POA: Diagnosis not present

## 2012-01-14 DIAGNOSIS — R0600 Dyspnea, unspecified: Secondary | ICD-10-CM

## 2012-01-14 LAB — HEPATIC FUNCTION PANEL
ALT: 19 U/L (ref 0–35)
Alkaline Phosphatase: 49 U/L (ref 39–117)
Bilirubin, Direct: 0 mg/dL (ref 0.0–0.3)
Total Bilirubin: 0.6 mg/dL (ref 0.3–1.2)
Total Protein: 6.6 g/dL (ref 6.0–8.3)

## 2012-01-14 LAB — BASIC METABOLIC PANEL
BUN: 13 mg/dL (ref 6–23)
Creatinine, Ser: 0.7 mg/dL (ref 0.4–1.2)
GFR: 87.64 mL/min (ref >60.00)
Glucose, Bld: 93 mg/dL (ref 70–99)

## 2012-01-14 LAB — LIPID PANEL
Total CHOL/HDL Ratio: 3
VLDL: 22.6 mg/dL (ref 0.0–40.0)

## 2012-01-14 NOTE — Progress Notes (Signed)
Quick Note:  Please report to patient. The recent labs are stable. Continue same medication and careful diet. ______ 

## 2012-01-15 ENCOUNTER — Telehealth: Payer: Self-pay | Admitting: *Deleted

## 2012-01-15 NOTE — Telephone Encounter (Signed)
Message copied by Burnell Blanks on Wed Jan 15, 2012  8:30 AM ------      Message from: Cassell Clement      Created: Tue Jan 14, 2012  9:29 PM       Please report to patient.  The recent labs are stable. Continue same medication and careful diet.

## 2012-01-15 NOTE — Telephone Encounter (Signed)
Advised patient of lab results   Patient is taking glucosamine for her arthritis.  Does have join pain in hands and feet.  Information only

## 2012-01-15 NOTE — Telephone Encounter (Signed)
Okay to take the glucosamine for arthritis

## 2012-01-22 DIAGNOSIS — F4329 Adjustment disorder with other symptoms: Secondary | ICD-10-CM | POA: Diagnosis not present

## 2012-01-29 ENCOUNTER — Telehealth: Payer: Self-pay | Admitting: Cardiology

## 2012-01-29 NOTE — Telephone Encounter (Signed)
Walk In pt Form " Pt dropped off RX form" sent to [facility] 01/29/12/KM

## 2012-02-03 ENCOUNTER — Other Ambulatory Visit: Payer: Self-pay | Admitting: *Deleted

## 2012-02-03 DIAGNOSIS — E7800 Pure hypercholesterolemia, unspecified: Secondary | ICD-10-CM

## 2012-02-03 MED ORDER — SIMVASTATIN 20 MG PO TABS: 20.0000 mg | ORAL_TABLET | Freq: Every day | ORAL | Status: DC

## 2012-02-03 NOTE — Telephone Encounter (Signed)
Refilled as requested  

## 2012-02-12 DIAGNOSIS — F4329 Adjustment disorder with other symptoms: Secondary | ICD-10-CM | POA: Diagnosis not present

## 2012-03-11 DIAGNOSIS — F4329 Adjustment disorder with other symptoms: Secondary | ICD-10-CM | POA: Diagnosis not present

## 2012-03-16 DIAGNOSIS — Z23 Encounter for immunization: Secondary | ICD-10-CM | POA: Diagnosis not present

## 2012-03-23 ENCOUNTER — Other Ambulatory Visit: Payer: Self-pay | Admitting: *Deleted

## 2012-03-23 DIAGNOSIS — H35379 Puckering of macula, unspecified eye: Secondary | ICD-10-CM | POA: Diagnosis not present

## 2012-03-23 DIAGNOSIS — G4700 Insomnia, unspecified: Secondary | ICD-10-CM

## 2012-03-23 DIAGNOSIS — H353290 Exudative age-related macular degeneration, unspecified eye, stage unspecified: Secondary | ICD-10-CM | POA: Diagnosis not present

## 2012-03-23 DIAGNOSIS — H43819 Vitreous degeneration, unspecified eye: Secondary | ICD-10-CM | POA: Diagnosis not present

## 2012-03-23 DIAGNOSIS — H35059 Retinal neovascularization, unspecified, unspecified eye: Secondary | ICD-10-CM | POA: Diagnosis not present

## 2012-03-23 MED ORDER — BUTABARBITAL SODIUM 30 MG PO TABS: 30.0000 mg | ORAL_TABLET | Freq: Every day | ORAL | Status: DC | PRN

## 2012-04-01 DIAGNOSIS — F4329 Adjustment disorder with other symptoms: Secondary | ICD-10-CM | POA: Diagnosis not present

## 2012-04-22 DIAGNOSIS — F4329 Adjustment disorder with other symptoms: Secondary | ICD-10-CM | POA: Diagnosis not present

## 2012-04-29 ENCOUNTER — Encounter: Payer: Self-pay | Admitting: Internal Medicine

## 2012-04-29 ENCOUNTER — Other Ambulatory Visit (INDEPENDENT_AMBULATORY_CARE_PROVIDER_SITE_OTHER): Payer: Medicare Other

## 2012-04-29 ENCOUNTER — Ambulatory Visit (INDEPENDENT_AMBULATORY_CARE_PROVIDER_SITE_OTHER): Payer: Medicare Other | Admitting: Internal Medicine

## 2012-04-29 VITALS — BP 132/80 | HR 54 | Temp 97.4°F | Ht 64.0 in | Wt 151.5 lb

## 2012-04-29 DIAGNOSIS — D509 Iron deficiency anemia, unspecified: Secondary | ICD-10-CM

## 2012-04-29 DIAGNOSIS — M949 Disorder of cartilage, unspecified: Secondary | ICD-10-CM | POA: Diagnosis not present

## 2012-04-29 DIAGNOSIS — M8580 Other specified disorders of bone density and structure, unspecified site: Secondary | ICD-10-CM | POA: Insufficient documentation

## 2012-04-29 DIAGNOSIS — R892 Abnormal level of other drugs, medicaments and biological substances in specimens from other organs, systems and tissues: Secondary | ICD-10-CM | POA: Diagnosis not present

## 2012-04-29 DIAGNOSIS — T460X1A Poisoning by cardiac-stimulant glycosides and drugs of similar action, accidental (unintentional), initial encounter: Secondary | ICD-10-CM

## 2012-04-29 DIAGNOSIS — M899 Disorder of bone, unspecified: Secondary | ICD-10-CM

## 2012-04-29 DIAGNOSIS — R7889 Finding of other specified substances, not normally found in blood: Secondary | ICD-10-CM

## 2012-04-29 DIAGNOSIS — I10 Essential (primary) hypertension: Secondary | ICD-10-CM

## 2012-04-29 DIAGNOSIS — E785 Hyperlipidemia, unspecified: Secondary | ICD-10-CM

## 2012-04-29 LAB — CBC WITH DIFFERENTIAL/PLATELET
Basophils Absolute: 0 10*3/uL (ref 0.0–0.1)
Eosinophils Absolute: 0.2 10*3/uL (ref 0.0–0.7)
Hemoglobin: 13.4 g/dL (ref 12.0–15.0)
Lymphocytes Relative: 19.4 % (ref 12.0–46.0)
MCHC: 33.6 g/dL (ref 30.0–36.0)
Monocytes Relative: 11.3 % (ref 3.0–12.0)
Neutro Abs: 4.5 10*3/uL (ref 1.4–7.7)
Neutrophils Relative %: 65.5 % (ref 43.0–77.0)
Platelets: 333 10*3/uL (ref 150.0–400.0)
RDW: 13.8 % (ref 11.5–14.6)

## 2012-04-29 LAB — HEPATIC FUNCTION PANEL
ALT: 21 U/L (ref 0–35)
AST: 20 U/L (ref 0–37)
Albumin: 3.9 g/dL (ref 3.5–5.2)
Alkaline Phosphatase: 56 U/L (ref 39–117)
Bilirubin, Direct: 0.1 mg/dL (ref 0.0–0.3)
Total Protein: 7 g/dL (ref 6.0–8.3)

## 2012-04-29 LAB — URINALYSIS, ROUTINE W REFLEX MICROSCOPIC
Urine Glucose: NEGATIVE
Urobilinogen, UA: 0.2 (ref 0.0–1.0)

## 2012-04-29 LAB — BASIC METABOLIC PANEL
CO2: 26 mEq/L (ref 19–32)
Chloride: 102 mEq/L (ref 96–112)
Glucose, Bld: 100 mg/dL — ABNORMAL HIGH (ref 70–99)
Potassium: 4.6 mEq/L (ref 3.5–5.1)
Sodium: 135 mEq/L (ref 135–145)

## 2012-04-29 LAB — TSH: TSH: 2.45 u[IU]/mL (ref 0.35–5.50)

## 2012-04-29 LAB — IBC PANEL: Saturation Ratios: 35 % (ref 20.0–50.0)

## 2012-04-29 NOTE — Assessment & Plan Note (Signed)
To check f/u level -

## 2012-04-29 NOTE — Assessment & Plan Note (Signed)
April 2013, lowest t-score -2.1, has finished > 5 yrs fosamax, no new tx for now, f/u dxa at 2-3 yrs

## 2012-04-29 NOTE — Assessment & Plan Note (Signed)
>>  ASSESSMENT AND PLAN FOR HTN (HYPERTENSION) WRITTEN ON 04/29/2012  9:58 PM BY [name], [name], MD  stable overall by hx and exam, most recent data reviewed with pt, and pt to continue medical treatment as before BP Readings from Last 3 Encounters:  04/29/12 132/80  11/11/11 112/64  05/22/11 152/84

## 2012-04-29 NOTE — Progress Notes (Signed)
Subjective:    Patient ID: Alexandra Reyes, female    DOB: 1925/08/26, 78 y.o.   MRN: [id number]  HPI  Here to f/u; overall doing ok,  Pt denies chest pain, increased sob or doe, wheezing, orthopnea, PND, increased LE swelling, palpitations, dizziness or syncope.  Pt denies new neurological symptoms such as new headache, or facial or extremity weakness or numbness   Pt denies polydipsia, polyuria, or low sugar symptoms such as weakness or confusion improved with po intake.  Pt states overall good compliance with meds, trying to follow lower cholesterol diet, wt overall stable but little exercise however.  Last dig level slightly elev with dig adjusted.  No overt bleeding or bruising.  Denies worsening depressive symptoms, suicidal ideation, or panic, though has ongoing anxiety, not increased recently.   Overall good compliance with treatment, and good medicine tolerability. Past Medical History  Diagnosis Date  . PAT (paroxysmal atrial tachycardia)   . DOE (dyspnea on exertion)   . Chest pressure   . Macular degeneration   . OA (osteoarthritis)   . Hiatal hernia   . GERD (gastroesophageal reflux disease)   . Cholelithiasis   . Dyspnea   . COPD (chronic obstructive pulmonary disease)   . HTN (hypertension) 05/18/2011  . Hyperlipidemia 05/18/2011  . Osteoporosis 05/18/2011  . Anemia, iron deficiency 05/18/2011  . DDD (degenerative disc disease), lumbar 05/18/2011  . Pectus excavatum 05/18/2011  . Allergy   . Allergic rhinitis, cause unspecified 05/22/2011  . Alcohol dependence in remission 05/22/2011   Past Surgical History  Procedure Date  . Tonsillectomy and adenoidectomy   . Breast lumpectomy   . US echocardiography 01/17/2003    EF 55-60%  . Cardiovascular stress test 03/08/2009    EF 84%  . Abdominal hysterectomy 1988  . Breast biopsy 1948    reports that she quit smoking about 9 years ago. She does not have any smokeless tobacco history on file. She reports that she does not drink  alcohol or use illicit drugs. family history includes Arthritis in her mother; Heart disease in her father; Heart failure in her mother; Hypertension in her father; and Kidney failure in her father. Allergies  Allergen Reactions  . Lisinopril    Current Outpatient Prescriptions on File Prior to Visit  Medication Sig Dispense Refill  . amLODipine (NORVASC) 2.5 MG tablet Take 1 tablet (2.5 mg total) by mouth daily.  90 tablet  3  . Ascorbic Acid (VITAMIN C PO) Take by mouth.        Marland Kitchen aspirin 81 MG tablet Take 81 mg by mouth daily.        Marland Kitchen atenolol (TENORMIN) 25 MG tablet Take 1 tablet (25 mg total) by mouth 2 (two) times daily.  100 tablet  PRN  . B Complex Vitamins (VITAMIN B COMPLEX PO) Take by mouth daily.        . butabarbital (BUTISOL) 30 MG tablet Take 1 tablet (30 mg total) by mouth daily as needed.  30 tablet  2  . calcium carbonate (CALCIUM 600) 600 MG TABS Take 600 mg by mouth daily.        . digoxin (LANOXIN) 0.125 MG tablet Take 125 mcg by mouth daily. Except 2 on Mon.      . ferrous sulfate 325 (65 FE) MG tablet Take 325 mg by mouth daily with breakfast.        . furosemide (LASIX) 40 MG tablet Take 1 tablet (40 mg total) by mouth daily.  90  tablet  1  . ibuprofen (ADVIL,MOTRIN) 200 MG tablet Take 200 mg by mouth every 6 (six) hours as needed.        . simvastatin (ZOCOR) 20 MG tablet Take 1 tablet (20 mg total) by mouth at bedtime.  90 tablet  3  . vitamin E 200 UNIT capsule Take 200 Units by mouth daily.       Review of Systems  Constitutional: Negative for diaphoresis and unexpected weight change.  HENT: Negative for tinnitus.   Eyes: Negative for photophobia and visual disturbance.  Respiratory: Negative for choking and stridor.   Gastrointestinal: Negative for vomiting and blood in stool.  Genitourinary: Negative for hematuria and decreased urine volume.  Musculoskeletal: Negative for gait problem.  Skin: Negative for color change and wound.  Neurological: Negative for  tremors and numbness.  Psychiatric/Behavioral: Negative for decreased concentration. The patient is not hyperactive.       Objective:   Physical Exam BP 132/80  Pulse 54  Temp 97.4 F (36.3 C) (Oral)  Ht 5\' 4"  (1.626 m)  Wt 151 lb 8 oz (68.72 kg)  BMI 26.00 kg/m2  SpO2 98% Physical Exam  VS noted Constitutional: Pt appears well-developed and well-nourished.  HENT: Head: Normocephalic.  Right Ear: External ear normal.  Left Ear: External ear normal.  Eyes: Conjunctivae and EOM are normal. Pupils are equal, round, and reactive to light.  Neck: Normal range of motion. Neck supple.  Cardiovascular: Normal rate and regular rhythm.   Pulmonary/Chest: Effort normal and breath sounds normal.  Abd:  Soft, NT, non-distended, + BS Neurological: Pt is alert. Not confused  Skin: Skin is warm. No erythema.  Psychiatric: Pt behavior is normal. Thought content normal.     Assessment & Plan:

## 2012-04-29 NOTE — Assessment & Plan Note (Signed)
To check f/u lab - , asympt, to f/u any worsening symptoms or concerns

## 2012-04-29 NOTE — Assessment & Plan Note (Signed)
stable overall by hx and exam, most recent data reviewed with pt, and pt to continue medical treatment as before BP Readings from Last 3 Encounters:  04/29/12 132/80  11/11/11 112/64  05/22/11 152/84

## 2012-04-29 NOTE — Patient Instructions (Addendum)
Continue all other medications as before Please have the pharmacy call with any other refills you may need. Please go to LAB in the Basement for the blood and/or urine tests to be done today You will be contacted by phone if any changes need to be made immediately.  Otherwise, you will receive a letter about your results with an explanation, but please check with MyChart first. Thank you for enrolling in MyChart. Please follow the instructions below to securely access your online medical record. MyChart allows you to send messages to your doctor, view your test results, renew your prescriptions, schedule appointments, and more. To Log into MyChart, please go to [email or web address], and your Username is:  [email or web address] Please keep your appointments with your specialists as you have planned Your next bone density would be due about April 2016 or 2017 Please return in 6 months, or sooner if needed

## 2012-04-29 NOTE — Assessment & Plan Note (Signed)
stable overall by hx and exam, most recent data reviewed with pt, and pt to continue medical treatment as before Lab Results  Component Value Date   LDLCALC 101* 01/14/2012

## 2012-04-30 LAB — VITAMIN D 25 HYDROXY (VIT D DEFICIENCY, FRACTURES): Vit D, 25-Hydroxy: 48 ng/mL (ref 30–89)

## 2012-04-30 LAB — DIGOXIN LEVEL: Digoxin Level: 1.1 ng/mL (ref 0.8–2.0)

## 2012-05-20 DIAGNOSIS — F4329 Adjustment disorder with other symptoms: Secondary | ICD-10-CM | POA: Diagnosis not present

## 2012-05-21 ENCOUNTER — Telehealth: Payer: Self-pay | Admitting: Cardiology

## 2012-05-21 MED ORDER — DIGOXIN 125 MCG PO TABS: 125.0000 ug | ORAL_TABLET | Freq: Every day | ORAL | Status: DC

## 2012-05-21 NOTE — Telephone Encounter (Signed)
New problem:  Digoxin  .125 mg.    [facility] at [place].

## 2012-05-26 ENCOUNTER — Other Ambulatory Visit: Payer: Self-pay | Admitting: Cardiology

## 2012-05-26 MED ORDER — DIGOXIN 125 MCG PO TABS: 125.0000 ug | ORAL_TABLET | Freq: Every day | ORAL | Status: DC

## 2012-05-28 ENCOUNTER — Encounter: Payer: Self-pay | Admitting: Cardiology

## 2012-05-28 ENCOUNTER — Ambulatory Visit (INDEPENDENT_AMBULATORY_CARE_PROVIDER_SITE_OTHER): Payer: Medicare Other | Admitting: Cardiology

## 2012-05-28 VITALS — BP 124/62 | HR 64 | Ht 64.0 in | Wt 153.0 lb

## 2012-05-28 DIAGNOSIS — E7800 Pure hypercholesterolemia, unspecified: Secondary | ICD-10-CM

## 2012-05-28 DIAGNOSIS — I119 Hypertensive heart disease without heart failure: Secondary | ICD-10-CM | POA: Diagnosis not present

## 2012-05-28 DIAGNOSIS — I471 Supraventricular tachycardia, unspecified: Secondary | ICD-10-CM

## 2012-05-28 DIAGNOSIS — E785 Hyperlipidemia, unspecified: Secondary | ICD-10-CM

## 2012-05-28 DIAGNOSIS — M1990 Unspecified osteoarthritis, unspecified site: Secondary | ICD-10-CM

## 2012-05-28 DIAGNOSIS — I4719 Other supraventricular tachycardia: Secondary | ICD-10-CM

## 2012-05-28 NOTE — Assessment & Plan Note (Signed)
The patient has generalized osteoarthritis and morning stiffness.  She is on ibuprofen and she is on a daily baby aspirin

## 2012-05-28 NOTE — Assessment & Plan Note (Signed)
The patient has a history of hyperlipidemia and is on simvastatin 20 mg daily.  She's tolerating it well without side effects

## 2012-05-28 NOTE — Assessment & Plan Note (Signed)
The patient has a history of PAT or paroxysmal supraventricular tachycardia.  She has had these episodes intermittently for many years.  The episodes occur about once or twice a month.  Recently her episodes appear to have been triggered by exposure to breathing cold air when she takes the garbage cans out on Monday morning.  The episodes will resolve if she takes an extra digoxin, atenolol, and butisol.  The patient notes slight tightness in the chest during the episodes which resolves when her heart rate reverts to normal.  As noted she did have a normal nuclear stress test in 2010

## 2012-05-28 NOTE — Progress Notes (Signed)
Alexandra Reyes Date of Birth:  [birth year] [facility] [address] [place], [place]  [address] [phone]         Fax   [phone]  History of Present Illness: This pleasant 78 year old widowed woman is seen for a scheduled 6 month followup office visit. He has a past history of diastolic dysfunction with dyspnea comment and a history of paroxysmal supraventricular tachycardia. She does not have any history of ischemic heart disease. She had a normal treadmill Cardiolite stress test in 2010. She has not had cardiac catheterization. Echocardiogram in 2004 showed diastolic dysfunction and trace mitral regurgitation.   Current Outpatient Prescriptions  Medication Sig Dispense Refill  . amLODipine (NORVASC) 2.5 MG tablet Take 1 tablet (2.5 mg total) by mouth daily.  90 tablet  3  . Ascorbic Acid (VITAMIN C PO) Take by mouth.        Marland Kitchen aspirin 81 MG tablet Take 81 mg by mouth daily.        Marland Kitchen atenolol (TENORMIN) 25 MG tablet Take 1 tablet (25 mg total) by mouth 2 (two) times daily.  100 tablet  PRN  . B Complex Vitamins (VITAMIN B COMPLEX PO) Take by mouth daily.        . butabarbital (BUTISOL) 30 MG tablet Take 1 tablet (30 mg total) by mouth daily as needed.  30 tablet  2  . calcium carbonate (CALCIUM 600) 600 MG TABS Take 600 mg by mouth daily.        . digoxin (LANOXIN) 0.125 MG tablet Take 1 tablet (125 mcg total) by mouth daily. Except 2 on Mon.  100 tablet  2  . ferrous sulfate 325 (65 FE) MG tablet Take 325 mg by mouth at bedtime.       . furosemide (LASIX) 40 MG tablet Take 1 tablet (40 mg total) by mouth daily.  90 tablet  1  . Glucosamine HCl-MSM (GLUCOSAMINE-MSM PO) Take 3 tablets by mouth daily.      Marland Kitchen ibuprofen (ADVIL,MOTRIN) 200 MG tablet Take 200 mg by mouth every 6 (six) hours as needed.        . simvastatin (ZOCOR) 20 MG tablet Take 1 tablet (20 mg total) by mouth at bedtime.  90 tablet  3  . vitamin E 200 UNIT capsule Take 200 Units by mouth daily.         Allergies  Allergen Reactions  . Lisinopril     Patient Active Problem List  Diagnosis  . Paroxysmal SVT (supraventricular tachycardia)  . Preventative health care  . HTN (hypertension)  . Hyperlipidemia  . Anemia, iron deficiency  . GERD (gastroesophageal reflux disease)  . DDD (degenerative disc disease), lumbar  . Pectus excavatum  . Macular degeneration  . OA (osteoarthritis)  . Cholelithiasis  . COPD (chronic obstructive pulmonary disease)  . Allergic rhinitis, cause unspecified  . Alcohol dependence in remission  . Depression  . Osteopenia  . Elevated digoxin level    History  Smoking status  . Former Smoker  . Quit date: 01/24/2003  Smokeless tobacco  . Not on file    History  Alcohol Use No    Family History  Problem Relation Age of Onset  . Heart failure Mother   . Arthritis Mother   . Kidney failure Father   . Heart disease Father   . Hypertension Father     Review of Systems: Constitutional: no fever chills diaphoresis or fatigue or change in weight.  Head and neck: no  hearing loss, no epistaxis, no photophobia or visual disturbance. Respiratory: No cough, shortness of breath or wheezing. Cardiovascular: No chest pain peripheral edema, palpitations. Gastrointestinal: No abdominal distention, no abdominal pain, no change in bowel habits hematochezia or melena. Genitourinary: No dysuria, no frequency, no urgency, no nocturia. Musculoskeletal:No arthralgias, no back pain, no gait disturbance or myalgias. Neurological: No dizziness, no headaches, no numbness, no seizures, no syncope, no weakness, no tremors. Hematologic: No lymphadenopathy, no easy bruising. Psychiatric: No confusion, no hallucinations, no sleep disturbance.    Physical Exam: Filed Vitals:   05/28/12 1504  BP: 124/62  Pulse: 64   the general appearance reveals a well-developed well-nourished woman in no distress.The head and neck exam reveals pupils equal and reactive.   Extraocular movements are full.  There is no scleral icterus.  The mouth and pharynx are normal.  The neck is supple.  The carotids reveal no bruits.  The jugular venous pressure is normal.  The  thyroid is not enlarged.  There is no lymphadenopathy.  The chest is clear to percussion and auscultation.  There are no rales or rhonchi.  Expansion of the chest is symmetrical.  The precordium is quiet.  The first heart sound is normal.  The second heart sound is physiologically split.  There is no murmur gallop rub or click.  There is no abnormal lift or heave.  The abdomen is soft and nontender.  The bowel sounds are normal.  The liver and spleen are not enlarged.  There are no abdominal masses.  There are no abdominal bruits.  Extremities reveal good pedal pulses.  There is no phlebitis or edema.  There is no cyanosis or clubbing.  Strength is normal and symmetrical in all extremities.  There is no lateralizing weakness.  There are no sensory deficits.  The skin is warm and dry.  There is no rash.     Assessment / Plan: The patient is to continue same medication.  Her most recent serum digoxin level was 1.1 in November 2013.  We will use strategies to avoid having to breathe the cold air first thing in the morning taking out the garbage cans.  She'll be rechecked in 6 months for followup office visit and EKG.

## 2012-05-28 NOTE — Patient Instructions (Addendum)
Your physician recommends that you continue on your current medications as directed. Please refer to the Current Medication list given to you today.  Your physician wants you to follow-up in: 6 month. You will receive a reminder letter in the mail two months in advance. If you don't receive a letter, please call our office to schedule the follow-up appointment.  

## 2012-06-10 ENCOUNTER — Encounter: Payer: Self-pay | Admitting: Cardiology

## 2012-06-11 ENCOUNTER — Other Ambulatory Visit: Payer: Self-pay | Admitting: *Deleted

## 2012-06-11 DIAGNOSIS — I119 Hypertensive heart disease without heart failure: Secondary | ICD-10-CM

## 2012-06-11 DIAGNOSIS — R0600 Dyspnea, unspecified: Secondary | ICD-10-CM

## 2012-06-11 MED ORDER — FUROSEMIDE 40 MG PO TABS: 40.0000 mg | ORAL_TABLET | Freq: Every day | ORAL | Status: DC

## 2012-06-11 NOTE — Telephone Encounter (Signed)
Fax Received. Refill Completed. Alexandra Reyes (R.M.A)   

## 2012-06-15 ENCOUNTER — Telehealth: Payer: Self-pay | Admitting: *Deleted

## 2012-06-15 NOTE — Telephone Encounter (Signed)
Message sent by my chart on patients Lasix not being refilled.  Called pharmacy and verified refill ok that was done on 06/12/11

## 2012-06-17 DIAGNOSIS — F4329 Adjustment disorder with other symptoms: Secondary | ICD-10-CM | POA: Diagnosis not present

## 2012-06-19 DIAGNOSIS — H35059 Retinal neovascularization, unspecified, unspecified eye: Secondary | ICD-10-CM | POA: Diagnosis not present

## 2012-06-19 DIAGNOSIS — H35379 Puckering of macula, unspecified eye: Secondary | ICD-10-CM | POA: Diagnosis not present

## 2012-06-19 DIAGNOSIS — H353290 Exudative age-related macular degeneration, unspecified eye, stage unspecified: Secondary | ICD-10-CM | POA: Diagnosis not present

## 2012-07-15 DIAGNOSIS — F4329 Adjustment disorder with other symptoms: Secondary | ICD-10-CM | POA: Diagnosis not present

## 2012-07-27 DIAGNOSIS — H35379 Puckering of macula, unspecified eye: Secondary | ICD-10-CM | POA: Diagnosis not present

## 2012-07-27 DIAGNOSIS — H35059 Retinal neovascularization, unspecified, unspecified eye: Secondary | ICD-10-CM | POA: Diagnosis not present

## 2012-07-27 DIAGNOSIS — H353290 Exudative age-related macular degeneration, unspecified eye, stage unspecified: Secondary | ICD-10-CM | POA: Diagnosis not present

## 2012-07-27 DIAGNOSIS — H43819 Vitreous degeneration, unspecified eye: Secondary | ICD-10-CM | POA: Diagnosis not present

## 2012-07-31 DIAGNOSIS — H353290 Exudative age-related macular degeneration, unspecified eye, stage unspecified: Secondary | ICD-10-CM | POA: Diagnosis not present

## 2012-09-04 DIAGNOSIS — H35059 Retinal neovascularization, unspecified, unspecified eye: Secondary | ICD-10-CM | POA: Diagnosis not present

## 2012-09-04 DIAGNOSIS — H353290 Exudative age-related macular degeneration, unspecified eye, stage unspecified: Secondary | ICD-10-CM | POA: Diagnosis not present

## 2012-09-16 DIAGNOSIS — F4329 Adjustment disorder with other symptoms: Secondary | ICD-10-CM | POA: Diagnosis not present

## 2012-09-28 ENCOUNTER — Encounter: Payer: Self-pay | Admitting: Cardiology

## 2012-09-28 ENCOUNTER — Other Ambulatory Visit: Payer: Self-pay | Admitting: Cardiology

## 2012-09-28 DIAGNOSIS — I471 Supraventricular tachycardia: Secondary | ICD-10-CM

## 2012-09-28 MED ORDER — ATENOLOL 25 MG PO TABS: 25.0000 mg | ORAL_TABLET | Freq: Two times a day (BID) | ORAL | Status: DC

## 2012-09-29 ENCOUNTER — Other Ambulatory Visit: Payer: Self-pay

## 2012-09-29 ENCOUNTER — Telehealth: Payer: Self-pay

## 2012-09-29 DIAGNOSIS — I471 Supraventricular tachycardia: Secondary | ICD-10-CM

## 2012-09-29 MED ORDER — ATENOLOL 25 MG PO TABS: 25.0000 mg | ORAL_TABLET | Freq: Two times a day (BID) | ORAL | Status: DC

## 2012-09-29 NOTE — Telephone Encounter (Signed)
pt called to rqst refill for atenolol. refill was done yesterday in phone in status.called pt pharmacy it was not received. escribed 100 R-3 verified receipt with pt pharmacy will fill today. I notified the pt

## 2012-10-02 DIAGNOSIS — H35059 Retinal neovascularization, unspecified, unspecified eye: Secondary | ICD-10-CM | POA: Diagnosis not present

## 2012-10-02 DIAGNOSIS — H353290 Exudative age-related macular degeneration, unspecified eye, stage unspecified: Secondary | ICD-10-CM | POA: Diagnosis not present

## 2012-10-06 ENCOUNTER — Encounter: Payer: Self-pay | Admitting: Cardiology

## 2012-10-12 ENCOUNTER — Encounter: Payer: Self-pay | Admitting: Cardiology

## 2012-10-12 ENCOUNTER — Other Ambulatory Visit: Payer: Self-pay

## 2012-10-12 MED ORDER — AMLODIPINE BESYLATE 2.5 MG PO TABS: 2.5000 mg | ORAL_TABLET | Freq: Every day | ORAL | Status: DC

## 2012-10-14 DIAGNOSIS — F4329 Adjustment disorder with other symptoms: Secondary | ICD-10-CM | POA: Diagnosis not present

## 2012-10-28 ENCOUNTER — Encounter: Payer: Self-pay | Admitting: Internal Medicine

## 2012-10-28 ENCOUNTER — Ambulatory Visit (INDEPENDENT_AMBULATORY_CARE_PROVIDER_SITE_OTHER): Payer: Medicare Other | Admitting: Internal Medicine

## 2012-10-28 VITALS — BP 122/62 | HR 56 | Temp 97.0°F | Ht 64.0 in | Wt 151.1 lb

## 2012-10-28 DIAGNOSIS — I471 Supraventricular tachycardia: Secondary | ICD-10-CM | POA: Diagnosis not present

## 2012-10-28 DIAGNOSIS — E785 Hyperlipidemia, unspecified: Secondary | ICD-10-CM | POA: Diagnosis not present

## 2012-10-28 DIAGNOSIS — J449 Chronic obstructive pulmonary disease, unspecified: Secondary | ICD-10-CM

## 2012-10-28 DIAGNOSIS — Z860100 Personal history of colon polyps, unspecified: Secondary | ICD-10-CM

## 2012-10-28 DIAGNOSIS — F329 Major depressive disorder, single episode, unspecified: Secondary | ICD-10-CM | POA: Diagnosis not present

## 2012-10-28 DIAGNOSIS — Z23 Encounter for immunization: Secondary | ICD-10-CM | POA: Diagnosis not present

## 2012-10-28 DIAGNOSIS — I10 Essential (primary) hypertension: Secondary | ICD-10-CM

## 2012-10-28 HISTORY — DX: Personal history of colon polyps, unspecified: Z86.0100

## 2012-10-28 HISTORY — DX: Personal history of colonic polyps: Z86.010

## 2012-10-28 NOTE — Assessment & Plan Note (Signed)
stable overall by history and exam, recent data reviewed with pt, and pt to continue medical treatment as before,  to f/u any worsening symptoms or concerns Lab Results  Component Value Date   WBC 6.9 04/29/2012   HGB 13.4 04/29/2012   HCT 39.9 04/29/2012   PLT 333.0 04/29/2012   GLUCOSE 100* 04/29/2012   CHOL 183 01/14/2012   TRIG 113.0 01/14/2012   HDL 59.30 01/14/2012   LDLDIRECT 129.2 05/15/2011   LDLCALC 101* 01/14/2012   ALT 21 04/29/2012   AST 20 04/29/2012   NA 135 04/29/2012   K 4.6 04/29/2012   CL 102 04/29/2012   CREATININE 0.7 04/29/2012   BUN 12 04/29/2012   CO2 26 04/29/2012   TSH 2.45 04/29/2012   INR 1.0 07/13/2007   HGBA1C  Value: 5.8 (NOTE)   The ADA recommends the following therapeutic goals for glycemic   control related to Hgb A1C measurement:   Goal of Therapy:   < 7.0% Hgb A1C   Action Suggested:  > 8.0% Hgb A1C   Ref:  Diabetes Care, 22, Suppl. 1, 1999 07/13/2007

## 2012-10-28 NOTE — Progress Notes (Signed)
Subjective:    Patient ID: Alexandra Reyes, female    DOB: 03-Aug-1925, 79 y.o.   MRN: [id number]  HPI  Here to f/u; overall doing ok,  Pt denies chest pain, increased sob or doe, wheezing, orthopnea, PND, increased LE swelling, palpitations, dizziness or syncope, except for one episode palps lasting about 5 hrs about 2 wks ago.  Pt denies polydipsia, polyuria, or low sugar symptoms such as weakness or confusion improved with po intake.  Pt denies new neurological symptoms such as new headache, or facial or extremity weakness or numbness.   Pt states overall good compliance with meds, has been trying to follow lower cholesterol diet, with wt overall stable,  but little exercise however.  Now on 40 mg lasix per Dr Patty Sermons.  Denies worsening depressive symptoms, suicidal ideation, or panic Past Medical History  Diagnosis Date  . PAT (paroxysmal atrial tachycardia)   . DOE (dyspnea on exertion)   . Chest pressure   . Macular degeneration   . OA (osteoarthritis)   . Hiatal hernia   . GERD (gastroesophageal reflux disease)   . Cholelithiasis   . Dyspnea   . COPD (chronic obstructive pulmonary disease)   . HTN (hypertension) 05/18/2011  . Hyperlipidemia 05/18/2011  . Osteoporosis 05/18/2011  . Anemia, iron deficiency 05/18/2011  . DDD (degenerative disc disease), lumbar 05/18/2011  . Pectus excavatum 05/18/2011  . Allergy   . Allergic rhinitis, cause unspecified 05/22/2011  . Alcohol dependence in remission 05/22/2011  . Personal history of colonic polyps 10/28/2012   Past Surgical History  Procedure Laterality Date  . Tonsillectomy and adenoidectomy    . Breast lumpectomy    . US echocardiography  01/17/2003    EF 55-60%  . Cardiovascular stress test  03/08/2009    EF 84%  . Abdominal hysterectomy  1988  . Breast biopsy  1948    reports that she quit smoking about 9 years ago. She does not have any smokeless tobacco history on file. She reports that she does not drink alcohol or use illicit  drugs. family history includes Arthritis in her mother; Heart disease in her father; Heart failure in her mother; Hypertension in her father; and Kidney failure in her father. Allergies  Allergen Reactions  . Lisinopril    Current Outpatient Prescriptions on File Prior to Visit  Medication Sig Dispense Refill  . amLODipine (NORVASC) 2.5 MG tablet Take 1 tablet (2.5 mg total) by mouth daily.  90 tablet  3  . Ascorbic Acid (VITAMIN C PO) Take by mouth.        Marland Kitchen aspirin 81 MG tablet Take 81 mg by mouth daily.        Marland Kitchen atenolol (TENORMIN) 25 MG tablet Take 1 tablet (25 mg total) by mouth 2 (two) times daily.  100 tablet  PRN  . B Complex Vitamins (VITAMIN B COMPLEX PO) Take by mouth daily.        . butabarbital (BUTISOL) 30 MG tablet Take 1 tablet (30 mg total) by mouth daily as needed.  30 tablet  2  . calcium carbonate (CALCIUM 600) 600 MG TABS Take 600 mg by mouth daily.        . digoxin (LANOXIN) 0.125 MG tablet Take 1 tablet (125 mcg total) by mouth daily. Except 2 on Mon.  100 tablet  2  . ferrous sulfate 325 (65 FE) MG tablet Take 325 mg by mouth at bedtime.       . furosemide (LASIX) 40 MG tablet Take  1 tablet (40 mg total) by mouth daily.  90 tablet  3  . Glucosamine HCl-MSM (GLUCOSAMINE-MSM PO) Take 3 tablets by mouth daily.      Marland Kitchen ibuprofen (ADVIL,MOTRIN) 200 MG tablet Take 200 mg by mouth every 6 (six) hours as needed.        . simvastatin (ZOCOR) 20 MG tablet Take 1 tablet (20 mg total) by mouth at bedtime.  90 tablet  3  . vitamin E 200 UNIT capsule Take 200 Units by mouth daily.       No current facility-administered medications on file prior to visit.   Review of Systems  Constitutional: Negative for unexpected weight change, or unusual diaphoresis  HENT: Negative for tinnitus.   Eyes: Negative for photophobia and visual disturbance.  Respiratory: Negative for choking and stridor.   Gastrointestinal: Negative for vomiting and blood in stool.  Genitourinary: Negative for  hematuria and decreased urine volume.  Musculoskeletal: Negative for acute joint swelling Skin: Negative for color change and wound.  Neurological: Negative for tremors and numbness other than noted  Psychiatric/Behavioral: Negative for decreased concentration or  hyperactivity.       Objective:   Physical Exam BP 122/62  Pulse 56  Temp(Src) 97 F (36.1 C) (Oral)  Ht 5\' 4"  (1.626 m)  Wt 151 lb 2 oz (68.55 kg)  BMI 25.93 kg/m2  SpO2 97% VS noted,  Constitutional: Pt appears well-developed and well-nourished.  HENT: Head: NCAT.  Right Ear: External ear normal.  Left Ear: External ear normal.  Eyes: Conjunctivae and EOM are normal. Pupils are equal, round, and reactive to light.  Neck: Normal range of motion. Neck supple.  Cardiovascular: Normal rate and regular rhythm.   Pulmonary/Chest: Effort normal and breath sounds normal.  Abd:  Soft, NT, non-distended, + BS Neurological: Pt is alert. Not confused  Skin: Skin is warm. No erythema. No LE edema Psychiatric: Pt behavior is normal. Thought content normal. not depressed affect    Assessment & Plan:

## 2012-10-28 NOTE — Assessment & Plan Note (Signed)
stable overall by history and exam, and pt to continue medical treatment as before,  to f/u any worsening symptoms or concerns 

## 2012-10-28 NOTE — Patient Instructions (Addendum)
You had the tetanus shot today (Td) Please continue all other medications as before, and refills have been done if requested. Please keep your appointments with your specialists as you have planned  - Dr Patty Sermons No further testing needed today Please have the pharmacy call with any other refills you may need. Please return in 6 months, or sooner if needed

## 2012-10-28 NOTE — Assessment & Plan Note (Signed)
stable overall by history and exam, recent data reviewed with pt, and pt to continue medical treatment as before,  to f/u any worsening symptoms or concerns BP Readings from Last 3 Encounters:  10/28/12 122/62  05/28/12 124/62  04/29/12 132/80

## 2012-10-28 NOTE — Assessment & Plan Note (Signed)
stable overall by history and exam, recent data reviewed with pt, and pt to continue medical treatment as before,  to f/u any worsening symptoms or concerns / Lab Results  Component Value Date   LDLCALC 101* 01/14/2012

## 2012-10-28 NOTE — Assessment & Plan Note (Signed)
>>  ASSESSMENT AND PLAN FOR HTN (HYPERTENSION) WRITTEN ON 10/28/2012 12:02 PM BY [name], [name], MD  stable overall by history and exam, recent data reviewed with pt, and pt to continue medical treatment as before,  to f/u any worsening symptoms or concerns BP Readings from Last 3 Encounters:  10/28/12 122/62  05/28/12 124/62  04/29/12 132/80

## 2012-10-28 NOTE — Assessment & Plan Note (Signed)
stable overall by history and exam, recent data reviewed with pt, and pt to continue medical treatment as before,  to f/u any worsening symptoms or concerns SpO2 Readings from Last 3 Encounters:  10/28/12 97%  04/29/12 98%  05/22/11 94%

## 2012-11-04 DIAGNOSIS — F4329 Adjustment disorder with other symptoms: Secondary | ICD-10-CM | POA: Diagnosis not present

## 2012-11-09 DIAGNOSIS — Z961 Presence of intraocular lens: Secondary | ICD-10-CM | POA: Diagnosis not present

## 2012-11-09 DIAGNOSIS — H353190 Nonexudative age-related macular degeneration, unspecified eye, stage unspecified: Secondary | ICD-10-CM | POA: Diagnosis not present

## 2012-11-13 DIAGNOSIS — H35059 Retinal neovascularization, unspecified, unspecified eye: Secondary | ICD-10-CM | POA: Diagnosis not present

## 2012-11-13 DIAGNOSIS — H43819 Vitreous degeneration, unspecified eye: Secondary | ICD-10-CM | POA: Diagnosis not present

## 2012-11-13 DIAGNOSIS — H43399 Other vitreous opacities, unspecified eye: Secondary | ICD-10-CM | POA: Diagnosis not present

## 2012-11-13 DIAGNOSIS — H353290 Exudative age-related macular degeneration, unspecified eye, stage unspecified: Secondary | ICD-10-CM | POA: Diagnosis not present

## 2012-11-25 ENCOUNTER — Encounter: Payer: Self-pay | Admitting: Cardiology

## 2012-11-25 ENCOUNTER — Ambulatory Visit (INDEPENDENT_AMBULATORY_CARE_PROVIDER_SITE_OTHER): Payer: Medicare Other | Admitting: Cardiology

## 2012-11-25 VITALS — BP 140/68 | HR 60 | Ht 64.0 in | Wt 150.0 lb

## 2012-11-25 DIAGNOSIS — E785 Hyperlipidemia, unspecified: Secondary | ICD-10-CM | POA: Diagnosis not present

## 2012-11-25 DIAGNOSIS — I10 Essential (primary) hypertension: Secondary | ICD-10-CM | POA: Diagnosis not present

## 2012-11-25 DIAGNOSIS — I471 Supraventricular tachycardia: Secondary | ICD-10-CM | POA: Diagnosis not present

## 2012-11-25 DIAGNOSIS — F4329 Adjustment disorder with other symptoms: Secondary | ICD-10-CM | POA: Diagnosis not present

## 2012-11-25 DIAGNOSIS — F329 Major depressive disorder, single episode, unspecified: Secondary | ICD-10-CM

## 2012-11-25 MED ORDER — DIGOXIN 250 MCG PO TABS: 0.1250 mg | ORAL_TABLET | Freq: Every day | ORAL | Status: DC

## 2012-11-25 MED ORDER — ALPRAZOLAM 0.25 MG PO TABS: 0.2500 mg | ORAL_TABLET | Freq: Every evening | ORAL | Status: DC | PRN

## 2012-11-25 NOTE — Patient Instructions (Addendum)
Your physician wants you to follow-up in: 6 months You will receive a reminder letter in the mail two months in advance. If you don't receive a letter, please call our office to schedule the follow-up appointment.   Your physician recommends that you return for a FASTING lipid profile: 6 months   Your physician has recommended you make the following change in your medication:   Xanax 0.25 mg once  daily as needed prn stress Generic digoxin 0.25 mg take 1/2 tablet daily

## 2012-11-25 NOTE — Assessment & Plan Note (Signed)
The patient has a history of depression and she has occasional episodes of anxiety particularly if she has a episode of tachycardia.  Previously she had been using Butisol on a when necessary basis but it has become very expensive even as a generic.  Accordingly we will try switching her to alprazolam 0.25 mg 1 daily as needed for stress.  She does not anticipate having to take them very often.  She wants to avoid any temperature range August because of her past history of being a recovered alcoholic.

## 2012-11-25 NOTE — Assessment & Plan Note (Signed)
>>  ASSESSMENT AND PLAN FOR HTN (HYPERTENSION) WRITTEN ON 11/25/2012  5:14 PM BY Cassell ClementBRACKBILL, THOMAS, MD  Blood pressure has been stable at home when checked.  She has not been experiencing any dizziness or syncope.  No symptoms of CHF.

## 2012-11-25 NOTE — Assessment & Plan Note (Signed)
The patient has a history of occasional paroxysmal supraventricular tachycardia.  Her last episode occurred on 09/22/12.  She has been under a lot of stress that day with 2 friends dying at about that time of the month.  Her episode lasted about 2 hours.  She took extra medication and did not go to the hospital.

## 2012-11-25 NOTE — Progress Notes (Signed)
Alexandra Reyes Date of Birth:  07/27/1925 [facility] [address] [place], [place]  [address] [phone]         Fax   [phone]  History of Present Illness: This pleasant 79 year old widowed woman is seen for a scheduled 6 month followup office visit. He has a past history of diastolic dysfunction with dyspnea  and a history of paroxysmal supraventricular tachycardia. She does not have any history of ischemic heart disease. She had a normal treadmill Cardiolite stress test in 2010. She has not had cardiac catheterization. Echocardiogram in 2004 showed diastolic dysfunction and trace mitral regurgitation.  She has a history of significant macular degeneration and is followed by Dr. Allyne Gee.  Current Outpatient Prescriptions  Medication Sig Dispense Refill  . amLODipine (NORVASC) 2.5 MG tablet Take 1 tablet (2.5 mg total) by mouth daily.  90 tablet  3  . Ascorbic Acid (VITAMIN C PO) Take by mouth.        Marland Kitchen aspirin 81 MG tablet Take 81 mg by mouth daily.        Marland Kitchen atenolol (TENORMIN) 25 MG tablet Take 1 tablet (25 mg total) by mouth 2 (two) times daily.  100 tablet  PRN  . B Complex Vitamins (VITAMIN B COMPLEX PO) Take by mouth daily.        . butabarbital (BUTISOL) 30 MG tablet Take 1 tablet (30 mg total) by mouth daily as needed.  30 tablet  2  . calcium carbonate (CALCIUM 600) 600 MG TABS Take 600 mg by mouth daily.        . digoxin (LANOXIN) 0.25 MG tablet Take 0.5 tablets (0.125 mg total) by mouth daily.  45 tablet  6  . ferrous sulfate 325 (65 FE) MG tablet Take 325 mg by mouth daily.       . furosemide (LASIX) 40 MG tablet Take 1 tablet (40 mg total) by mouth daily.  90 tablet  3  . Glucosamine HCl-MSM (GLUCOSAMINE-MSM PO) Take 3 tablets by mouth daily.      Marland Kitchen ibuprofen (ADVIL,MOTRIN) 200 MG tablet Take 200 mg by mouth every 6 (six) hours as needed.        . simvastatin (ZOCOR) 20 MG tablet Take 1 tablet (20 mg total) by mouth at bedtime.  90 tablet  3  .  vitamin E 200 UNIT capsule Take 200 Units by mouth daily.      Marland Kitchen ALPRAZolam (XANAX) 0.25 MG tablet Take 1 tablet (0.25 mg total) by mouth at bedtime as needed for sleep.  30 tablet  1   No current facility-administered medications for this visit.    Allergies  Allergen Reactions  . Lisinopril     Patient Active Problem List   Diagnosis Date Noted  . Personal history of colonic polyps 10/28/2012  . Osteopenia 04/29/2012  . Elevated digoxin level 04/29/2012  . Allergic rhinitis, cause unspecified 05/22/2011  . Alcohol dependence in remission 05/22/2011  . Depression 05/22/2011  . Preventative health care 05/18/2011  . HTN (hypertension) 05/18/2011  . Hyperlipidemia 05/18/2011  . Anemia, iron deficiency 05/18/2011  . GERD (gastroesophageal reflux disease) 05/18/2011  . DDD (degenerative disc disease), lumbar 05/18/2011  . Pectus excavatum 05/18/2011  . Macular degeneration   . OA (osteoarthritis)   . Cholelithiasis   . COPD (chronic obstructive pulmonary disease)   . Paroxysmal SVT (supraventricular tachycardia) 02/07/2011    History  Smoking status  . Former Smoker  . Quit date: 01/24/2003  Smokeless tobacco  .  Not on file    History  Alcohol Use No    Family History  Problem Relation Age of Onset  . Heart failure Mother   . Arthritis Mother   . Kidney failure Father   . Heart disease Father   . Hypertension Father     Review of Systems: Constitutional: no fever chills diaphoresis or fatigue or change in weight.  Head and neck: no hearing loss, no epistaxis, no photophobia or visual disturbance. Respiratory: No cough, shortness of breath or wheezing. Cardiovascular: No chest pain peripheral edema, palpitations. Gastrointestinal: No abdominal distention, no abdominal pain, no change in bowel habits hematochezia or melena. Genitourinary: No dysuria, no frequency, no urgency, no nocturia. Musculoskeletal:No arthralgias, no back pain, no gait disturbance or  myalgias. Neurological: No dizziness, no headaches, no numbness, no seizures, no syncope, no weakness, no tremors. Hematologic: No lymphadenopathy, no easy bruising. Psychiatric: No confusion, no hallucinations, no sleep disturbance.    Physical Exam: Filed Vitals:   11/25/12 1412  BP: 140/68  Pulse: 60   the general appearance reveals a well-developed well-nourished woman in no distress.The head and neck exam reveals pupils equal and reactive.  Extraocular movements are full.  There is no scleral icterus.  The mouth and pharynx are normal.  The neck is supple.  The carotids reveal no bruits.  The jugular venous pressure is normal.  The  thyroid is not enlarged.  There is no lymphadenopathy.  The chest is clear to percussion and auscultation.  There are no rales or rhonchi.  Expansion of the chest is symmetrical.  The precordium is quiet.  The first heart sound is normal.  The second heart sound is physiologically split.  There is no murmur gallop rub or click.  There is no abnormal lift or heave.  The abdomen is soft and nontender.  The bowel sounds are normal.  The liver and spleen are not enlarged.  There are no abdominal masses.  There are no abdominal bruits.  Extremities reveal good pedal pulses.  There is no phlebitis or edema.  There is no cyanosis or clubbing.  Strength is normal and symmetrical in all extremities.  There is no lateralizing weakness.  There are no sensory deficits.  The skin is warm and dry.  There is no rash.  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  Assessment / Plan:  Continue on same medication except try switching to alprazolam 0.25 mg one daily as needed. Also she is asking that we switch her to digoxin 0.25 mg and she will take a half daily instead of taking a whole smaller 0.125 tablet.  This is because of his  significant price differential. Recheck in 6 months for followup office visit and fasting lab work including lipid panel hepatic function panel and basal metabolic panel.

## 2012-11-25 NOTE — Assessment & Plan Note (Signed)
Blood pressure has been stable at home when checked.  She has not been experiencing any dizziness or syncope.  No symptoms of CHF.

## 2012-12-16 DIAGNOSIS — F4329 Adjustment disorder with other symptoms: Secondary | ICD-10-CM | POA: Diagnosis not present

## 2012-12-24 DIAGNOSIS — H35059 Retinal neovascularization, unspecified, unspecified eye: Secondary | ICD-10-CM | POA: Diagnosis not present

## 2012-12-24 DIAGNOSIS — H43819 Vitreous degeneration, unspecified eye: Secondary | ICD-10-CM | POA: Diagnosis not present

## 2012-12-24 DIAGNOSIS — H353290 Exudative age-related macular degeneration, unspecified eye, stage unspecified: Secondary | ICD-10-CM | POA: Diagnosis not present

## 2012-12-24 DIAGNOSIS — H43399 Other vitreous opacities, unspecified eye: Secondary | ICD-10-CM | POA: Diagnosis not present

## 2012-12-29 DIAGNOSIS — H353290 Exudative age-related macular degeneration, unspecified eye, stage unspecified: Secondary | ICD-10-CM | POA: Diagnosis not present

## 2013-01-08 DIAGNOSIS — H353290 Exudative age-related macular degeneration, unspecified eye, stage unspecified: Secondary | ICD-10-CM | POA: Diagnosis not present

## 2013-01-08 DIAGNOSIS — H43819 Vitreous degeneration, unspecified eye: Secondary | ICD-10-CM | POA: Diagnosis not present

## 2013-01-08 DIAGNOSIS — H35059 Retinal neovascularization, unspecified, unspecified eye: Secondary | ICD-10-CM | POA: Diagnosis not present

## 2013-01-20 DIAGNOSIS — F4329 Adjustment disorder with other symptoms: Secondary | ICD-10-CM | POA: Diagnosis not present

## 2013-02-05 DIAGNOSIS — H43399 Other vitreous opacities, unspecified eye: Secondary | ICD-10-CM | POA: Diagnosis not present

## 2013-02-05 DIAGNOSIS — H35059 Retinal neovascularization, unspecified, unspecified eye: Secondary | ICD-10-CM | POA: Diagnosis not present

## 2013-02-05 DIAGNOSIS — H353290 Exudative age-related macular degeneration, unspecified eye, stage unspecified: Secondary | ICD-10-CM | POA: Diagnosis not present

## 2013-02-05 DIAGNOSIS — H43819 Vitreous degeneration, unspecified eye: Secondary | ICD-10-CM | POA: Diagnosis not present

## 2013-02-10 DIAGNOSIS — F4329 Adjustment disorder with other symptoms: Secondary | ICD-10-CM | POA: Diagnosis not present

## 2013-02-17 ENCOUNTER — Other Ambulatory Visit: Payer: Self-pay | Admitting: *Deleted

## 2013-02-17 ENCOUNTER — Encounter: Payer: Self-pay | Admitting: Cardiology

## 2013-02-17 DIAGNOSIS — E7800 Pure hypercholesterolemia, unspecified: Secondary | ICD-10-CM

## 2013-02-17 MED ORDER — SIMVASTATIN 20 MG PO TABS: 20.0000 mg | ORAL_TABLET | Freq: Every day | ORAL | Status: DC

## 2013-03-05 DIAGNOSIS — H43819 Vitreous degeneration, unspecified eye: Secondary | ICD-10-CM | POA: Diagnosis not present

## 2013-03-05 DIAGNOSIS — H353290 Exudative age-related macular degeneration, unspecified eye, stage unspecified: Secondary | ICD-10-CM | POA: Diagnosis not present

## 2013-03-05 DIAGNOSIS — H35059 Retinal neovascularization, unspecified, unspecified eye: Secondary | ICD-10-CM | POA: Diagnosis not present

## 2013-03-05 DIAGNOSIS — H43399 Other vitreous opacities, unspecified eye: Secondary | ICD-10-CM | POA: Diagnosis not present

## 2013-03-17 DIAGNOSIS — Z23 Encounter for immunization: Secondary | ICD-10-CM | POA: Diagnosis not present

## 2013-03-17 DIAGNOSIS — F4329 Adjustment disorder with other symptoms: Secondary | ICD-10-CM | POA: Diagnosis not present

## 2013-04-09 DIAGNOSIS — H353290 Exudative age-related macular degeneration, unspecified eye, stage unspecified: Secondary | ICD-10-CM | POA: Diagnosis not present

## 2013-04-09 DIAGNOSIS — H35059 Retinal neovascularization, unspecified, unspecified eye: Secondary | ICD-10-CM | POA: Diagnosis not present

## 2013-04-14 DIAGNOSIS — F4329 Adjustment disorder with other symptoms: Secondary | ICD-10-CM | POA: Diagnosis not present

## 2013-04-15 ENCOUNTER — Other Ambulatory Visit: Payer: Self-pay

## 2013-04-28 ENCOUNTER — Ambulatory Visit (INDEPENDENT_AMBULATORY_CARE_PROVIDER_SITE_OTHER)
Admission: RE | Admit: 2013-04-28 | Discharge: 2013-04-28 | Disposition: A | Discharge status: Home / Self Care | Payer: Medicare Other | Source: Ambulatory Visit | Attending: Internal Medicine | Admitting: Internal Medicine

## 2013-04-28 ENCOUNTER — Encounter: Payer: Self-pay | Admitting: Internal Medicine

## 2013-04-28 ENCOUNTER — Ambulatory Visit (INDEPENDENT_AMBULATORY_CARE_PROVIDER_SITE_OTHER): Payer: Medicare Other | Admitting: Internal Medicine

## 2013-04-28 VITALS — BP 122/72 | HR 51 | Temp 97.5°F | Ht 64.0 in | Wt 151.4 lb

## 2013-04-28 DIAGNOSIS — R0989 Other specified symptoms and signs involving the circulatory and respiratory systems: Secondary | ICD-10-CM

## 2013-04-28 DIAGNOSIS — J449 Chronic obstructive pulmonary disease, unspecified: Secondary | ICD-10-CM

## 2013-04-28 DIAGNOSIS — R0600 Dyspnea, unspecified: Secondary | ICD-10-CM | POA: Insufficient documentation

## 2013-04-28 DIAGNOSIS — R0602 Shortness of breath: Secondary | ICD-10-CM | POA: Diagnosis not present

## 2013-04-28 DIAGNOSIS — R0609 Other forms of dyspnea: Secondary | ICD-10-CM | POA: Insufficient documentation

## 2013-04-28 DIAGNOSIS — I10 Essential (primary) hypertension: Secondary | ICD-10-CM

## 2013-04-28 DIAGNOSIS — J4489 Other specified chronic obstructive pulmonary disease: Secondary | ICD-10-CM

## 2013-04-28 DIAGNOSIS — R05 Cough: Secondary | ICD-10-CM | POA: Diagnosis not present

## 2013-04-28 MED ORDER — ALBUTEROL SULFATE HFA 108 (90 BASE) MCG/ACT IN AERS: 2.0000 | INHALATION_SPRAY | Freq: Four times a day (QID) | RESPIRATORY_TRACT | Status: DC | PRN

## 2013-04-28 MED ORDER — TIOTROPIUM BROMIDE MONOHYDRATE 18 MCG IN CAPS: 18.0000 ug | ORAL_CAPSULE | Freq: Every day | RESPIRATORY_TRACT | Status: DC

## 2013-04-28 NOTE — Progress Notes (Signed)
Pre-visit discussion using our clinic review tool. No additional management support is needed unless otherwise documented below in the visit note.  

## 2013-04-28 NOTE — Progress Notes (Signed)
Subjective:    Patient ID: Alexandra Reyes, female    DOB: [birth year], 79 y.o.   MRN: [id number]  HPI  Here to f/u, c/o ongoing sob, very mild in past but now worse and frightened her x 3 mo, worse with walking and even talking, former smoker, no wheezing Pt denies chest pain, orthopnea, PND, increased LE swelling, palpitations, dizziness or syncope, wondering if might be more heart or lungs.  Walks room to room, but walking more than 50 ft suchas in from the parking lot, or into the grocery, but walking more slowly with the cart in grocery is not so bad.   Pt denies fever, wt loss, night sweats, loss of appetite, or other constitutional symptoms.  Denies worsening depressive symptoms, suicidal ideation, or panic; has ongoing anxiety, not increased recently, and only with DOE, no sob at rest. Last seen per cardiology may 2014.  Last labs nov 2013 with normal hgb. No overt bleeding or bruising.   Past Medical History  Diagnosis Date  . PAT (paroxysmal atrial tachycardia)   . DOE (dyspnea on exertion)   . Chest pressure   . Macular degeneration   . OA (osteoarthritis)   . Hiatal hernia   . GERD (gastroesophageal reflux disease)   . Cholelithiasis   . Dyspnea   . COPD (chronic obstructive pulmonary disease)   . HTN (hypertension) 05/18/2011  . Hyperlipidemia 05/18/2011  . Osteoporosis 05/18/2011  . Anemia, iron deficiency 05/18/2011  . DDD (degenerative disc disease), lumbar 05/18/2011  . Pectus excavatum 05/18/2011  . Allergy   . Allergic rhinitis, cause unspecified 05/22/2011  . Alcohol dependence in remission 05/22/2011  . Personal history of colonic polyps 10/28/2012   Past Surgical History  Procedure Laterality Date  . Tonsillectomy and adenoidectomy    . Breast lumpectomy    . US echocardiography  01/17/2003    EF 55-60%  . Cardiovascular stress test  03/08/2009    EF 84%  . Abdominal hysterectomy  1988  . Breast biopsy  1948    reports that she quit smoking about 10 years ago. She  does not have any smokeless tobacco history on file. She reports that she does not drink alcohol or use illicit drugs. family history includes Arthritis in her mother; Heart disease in her father; Heart failure in her mother; Hypertension in her father; Kidney failure in her father. Allergies  Allergen Reactions  . Lisinopril   . Pneumovax 23 [Pneumococcal Vac Polyvalent]     Flu like symtpoms for 1 day   Review of Systems  Constitutional: Negative for unexpected weight change, or unusual diaphoresis  HENT: Negative for tinnitus.   Eyes: Negative for photophobia and visual disturbance.  Respiratory: Negative for choking and stridor.   Gastrointestinal: Negative for vomiting and blood in stool.  Genitourinary: Negative for hematuria and decreased urine volume.  Musculoskeletal: Negative for acute joint swelling Skin: Negative for color change and wound.  Neurological: Negative for tremors and numbness other than noted  Psychiatric/Behavioral: Negative for decreased concentration or  hyperactivity.       Objective:   Physical Exam BP 122/72  Pulse 51  Temp(Src) 97.5 F (36.4 C) (Oral)  Ht 5\' 4"  (1.626 m)  Wt 151 lb 6 oz (68.663 kg)  BMI 25.97 kg/m2  SpO2 98% VS noted,  Constitutional: Pt appears well-developed and well-nourished.  HENT: Head: NCAT.  Right Ear: External ear normal.  Left Ear: External ear normal.  Eyes: Conjunctivae and EOM are normal. Pupils are equal, round,  and reactive to light.  Neck: Normal range of motion. Neck supple.  Cardiovascular: Normal rate and regular rhythm.   Pulmonary/Chest: Effort normal and breath sounds decreased but no rales or wheezing.  Abd:  Soft, NT, non-distended, + BS Neurological: Pt is alert. Not confused  Skin: Skin is warm. No erythema.  Psychiatric: Pt behavior is normal. Thought content normal.     Assessment & Plan:

## 2013-04-28 NOTE — Assessment & Plan Note (Signed)
stable overall by history and exam, recent data reviewed with pt, and pt to continue medical treatment as before,  to f/u any worsening symptoms or concerns BP Readings from Last 3 Encounters:  04/28/13 122/72  11/25/12 140/68  10/28/12 122/62

## 2013-04-28 NOTE — Assessment & Plan Note (Addendum)
Suspect more symptomatic now with significnat doe, former smoker 40 pk yrs, for PFT;s, CXR, trial spiriva and alb MDI prn

## 2013-04-28 NOTE — Assessment & Plan Note (Addendum)
Also for lab eval today such as to r/o sympt anemia, but I suspect most likely copd related, also has f/u with Card in dec 2014  Note:  Total time for pt hx, exam, review of record with pt in the room, determination of diagnoses and plan for further eval and tx is > 40 min, with over 50% spent in coordination and counseling of patient

## 2013-04-28 NOTE — Patient Instructions (Addendum)
Please take all new medication as prescribed  - the spiriva is once per day, and the albuterol inhaler is 2 puffs every 6 hrs AS needed (both [facility] to [facility], so let us know if you want to have this sent to the [facility] pharmacy) Please continue all other medications as before Please go to the XRAY Department in the Basement (go straight as you get off the elevator) for the x-ray testing Please go to the LAB in the Basement (turn left off the elevator) for the tests to be done today You will be contacted by phone if any changes need to be made immediately.  Otherwise, you will receive a letter about your results with an explanation, but please check with MyChart first.  Please keep your appointments with your specialists as you have planned - cardiology  You will be contacted regarding the referral for: Pulmonary Function testing  If the CXR and Lung testing is ok, the medications dont work, and cardiology thinks your symptoms are not likely cardiac, we should consider a CTscan of the chest, or referral to Pulmonary

## 2013-04-28 NOTE — Assessment & Plan Note (Signed)
>>  ASSESSMENT AND PLAN FOR HTN (HYPERTENSION) WRITTEN ON 04/28/2013 10:42 PM BY [name], [name], MD  stable overall by history and exam, recent data reviewed with pt, and pt to continue medical treatment as before,  to f/u any worsening symptoms or concerns BP Readings from Last 3 Encounters:  04/28/13 122/72  11/25/12 140/68  10/28/12 122/62

## 2013-04-29 ENCOUNTER — Encounter: Payer: Self-pay | Admitting: Internal Medicine

## 2013-04-29 ENCOUNTER — Telehealth: Payer: Self-pay

## 2013-04-29 NOTE — Telephone Encounter (Signed)
Patient informed of MD instructions and agreed .

## 2013-04-29 NOTE — Telephone Encounter (Signed)
No, she should proceed with PFT's and cont all medications for now as is, until further information is known

## 2013-04-29 NOTE — Telephone Encounter (Signed)
The patient has been on my chart and did see results of her chest xray and that it was ok.  She understood by PCP that if ok she did not need to schedule the PFT? Advise if not needed she will for now do inhalers?

## 2013-05-03 ENCOUNTER — Ambulatory Visit (INDEPENDENT_AMBULATORY_CARE_PROVIDER_SITE_OTHER): Payer: Medicare Other | Admitting: Internal Medicine

## 2013-05-03 ENCOUNTER — Other Ambulatory Visit: Payer: Self-pay | Admitting: Cardiology

## 2013-05-03 DIAGNOSIS — R0609 Other forms of dyspnea: Secondary | ICD-10-CM | POA: Diagnosis not present

## 2013-05-03 DIAGNOSIS — J449 Chronic obstructive pulmonary disease, unspecified: Secondary | ICD-10-CM

## 2013-05-03 LAB — PULMONARY FUNCTION TEST

## 2013-05-03 NOTE — Progress Notes (Signed)
PFT done today. 

## 2013-05-12 DIAGNOSIS — F4329 Adjustment disorder with other symptoms: Secondary | ICD-10-CM | POA: Diagnosis not present

## 2013-05-14 ENCOUNTER — Other Ambulatory Visit (INDEPENDENT_AMBULATORY_CARE_PROVIDER_SITE_OTHER): Payer: Medicare Other

## 2013-05-14 ENCOUNTER — Ambulatory Visit (INDEPENDENT_AMBULATORY_CARE_PROVIDER_SITE_OTHER): Payer: Medicare Other | Admitting: Cardiology

## 2013-05-14 ENCOUNTER — Encounter: Payer: Self-pay | Admitting: Cardiology

## 2013-05-14 ENCOUNTER — Encounter: Payer: Self-pay | Admitting: Internal Medicine

## 2013-05-14 VITALS — BP 110/72 | HR 53 | Ht 64.0 in | Wt 149.8 lb

## 2013-05-14 DIAGNOSIS — I471 Supraventricular tachycardia: Secondary | ICD-10-CM | POA: Diagnosis not present

## 2013-05-14 DIAGNOSIS — R0609 Other forms of dyspnea: Secondary | ICD-10-CM

## 2013-05-14 DIAGNOSIS — J449 Chronic obstructive pulmonary disease, unspecified: Secondary | ICD-10-CM | POA: Diagnosis not present

## 2013-05-14 DIAGNOSIS — I10 Essential (primary) hypertension: Secondary | ICD-10-CM | POA: Diagnosis not present

## 2013-05-14 DIAGNOSIS — E785 Hyperlipidemia, unspecified: Secondary | ICD-10-CM

## 2013-05-14 DIAGNOSIS — R0600 Dyspnea, unspecified: Secondary | ICD-10-CM

## 2013-05-14 DIAGNOSIS — H35059 Retinal neovascularization, unspecified, unspecified eye: Secondary | ICD-10-CM | POA: Diagnosis not present

## 2013-05-14 DIAGNOSIS — H353290 Exudative age-related macular degeneration, unspecified eye, stage unspecified: Secondary | ICD-10-CM | POA: Diagnosis not present

## 2013-05-14 LAB — PULMONARY FUNCTION TEST
DL/VA % pred: 67 %
DL/VA: 3.15 ml/min/mmHg/L
DLCO unc % pred: 55 %
DLCO unc: 12.65 ml/min/mmHg
FEF 25-75 Post: 0.96 L/sec
FEF 25-75 Pre: 0.83 L/sec
FEF2575-%Change-Post: 16 %
FEF2575-%Pred-Post: 89 %
FEF2575-%Pred-Pre: 76 %
FEV1-%Pred-Pre: 97 %
FEV1-Pre: 1.63 L
FEV1FVC-%Pred-Pre: 95 %
FEV6-%Change-Post: 0 %
FEV6-%Pred-Post: 108 %
FVC-%Change-Post: 0 %
FVC-%Pred-Post: 103 %
FVC-%Pred-Pre: 103 %
FVC-Post: 2.33 L
Post FEV1/FVC ratio: 70 %
Pre FEV1/FVC ratio: 69 %
RV: 1.86 L
TLC % pred: 92 %
TLC: 4.51 L

## 2013-05-14 LAB — HEPATIC FUNCTION PANEL
ALT: 22 U/L (ref 0–35)
Albumin: 3.8 g/dL (ref 3.5–5.2)
Bilirubin, Direct: 0.1 mg/dL (ref 0.0–0.3)
Total Bilirubin: 0.7 mg/dL (ref 0.3–1.2)

## 2013-05-14 LAB — BASIC METABOLIC PANEL
Calcium: 9.1 mg/dL (ref 8.4–10.5)
Chloride: 100 mEq/L (ref 96–112)
Creatinine, Ser: 0.7 mg/dL (ref 0.4–1.2)
GFR: 83.12 mL/min (ref >60.00)
Glucose, Bld: 92 mg/dL (ref 70–99)
Potassium: 4 mEq/L (ref 3.5–5.1)

## 2013-05-14 LAB — CBC WITH DIFFERENTIAL/PLATELET
Basophils Relative: 0.7 % (ref 0.0–3.0)
Eosinophils Absolute: 0.2 10*3/uL (ref 0.0–0.7)
HCT: 39.5 % (ref 36.0–46.0)
Hemoglobin: 13.1 g/dL (ref 12.0–15.0)
Lymphs Abs: 1.5 10*3/uL (ref 0.7–4.0)
MCHC: 33.2 g/dL (ref 30.0–36.0)
MCV: 87.7 fl (ref 78.0–100.0)
Monocytes Absolute: 0.7 10*3/uL (ref 0.1–1.0)
Neutro Abs: 4.1 10*3/uL (ref 1.4–7.7)
Neutrophils Relative %: 61.8 % (ref 43.0–77.0)
RBC: 4.5 Mil/uL (ref 3.87–5.11)

## 2013-05-14 LAB — LIPID PANEL
Total CHOL/HDL Ratio: 4
Triglycerides: 132 mg/dL (ref 0.0–149.0)

## 2013-05-14 NOTE — Assessment & Plan Note (Signed)
The patient has been experiencing dyspnea on exertion.  This may be from her COPD.  She is a former smoker.  We will update her echocardiogram.

## 2013-05-14 NOTE — Assessment & Plan Note (Signed)
The patient has a history of dyslipidemia.  She is on simvastatin.  She is not having any myalgias or other side effects from the simvastatin.  We are checking fasting lab work today.

## 2013-05-14 NOTE — Progress Notes (Signed)
Alexandra Reyes Date of Birth:  03/30/1926 [address] [facility] [address] [place], [place]  [address] [phone]         Fax   [phone]  History of Present Illness: This pleasant 79 year old widowed woman is seen for a scheduled 6 month followup office visit.  She has a past history of diastolic dysfunction with dyspnea  and a history of paroxysmal supraventricular tachycardia. She does not have any history of ischemic heart disease. She had a normal treadmill Cardiolite stress test in 2010. She has not had cardiac catheterization. Echocardiogram in 2004 showed diastolic dysfunction and trace mitral regurgitation.  She has a history of significant macular degeneration and is followed by Dr. Allyne Gee.  She has been having more shortness of breath recently.  She had a chest x-ray on 04/28/13 which showed hyperinflation.  She has had pulmonary function studies through Dr. Raphael Gibney office.  Her last echocardiogram was in 2004.  Current Outpatient Prescriptions  Medication Sig Dispense Refill  . albuterol (PROVENTIL HFA;VENTOLIN HFA) 108 (90 BASE) MCG/ACT inhaler Inhale 2 puffs into the lungs every 6 (six) hours as needed for wheezing or shortness of breath.  1 Inhaler  11  . ALPRAZolam (XANAX) 0.25 MG tablet TAKE ONE TABLET BY MOUTH DAILY AS NEEDED   30 tablet  1  . amLODipine (NORVASC) 2.5 MG tablet Take 1 tablet (2.5 mg total) by mouth daily.  90 tablet  3  . Ascorbic Acid (VITAMIN C PO) Take by mouth.        Marland Kitchen aspirin 81 MG tablet Take 81 mg by mouth daily.        Marland Kitchen atenolol (TENORMIN) 25 MG tablet Take 1 tablet (25 mg total) by mouth 2 (two) times daily.  100 tablet  PRN  . B Complex Vitamins (VITAMIN B COMPLEX PO) Take by mouth daily.        . calcium carbonate (CALCIUM 600) 600 MG TABS Take 600 mg by mouth daily.        . digoxin (LANOXIN) 0.25 MG tablet Take 0.5 tablets (0.125 mg total) by mouth daily.  45 tablet  6  . ferrous sulfate 325 (65 FE) MG tablet Take 325 mg by mouth daily.         . furosemide (LASIX) 40 MG tablet Take 1 tablet (40 mg total) by mouth daily.  90 tablet  3  . Glucosamine HCl-MSM (GLUCOSAMINE-MSM PO) Take 3 tablets by mouth daily.      Marland Kitchen ibuprofen (ADVIL,MOTRIN) 200 MG tablet Take 200 mg by mouth every 6 (six) hours as needed.        . simvastatin (ZOCOR) 20 MG tablet Take 1 tablet (20 mg total) by mouth at bedtime.  90 tablet  3  . tiotropium (SPIRIVA HANDIHALER) 18 MCG inhalation capsule Place 1 capsule (18 mcg total) into inhaler and inhale daily.  30 capsule  12  . vitamin E 200 UNIT capsule Take 200 Units by mouth daily.       No current facility-administered medications for this visit.    Allergies  Allergen Reactions  . Lisinopril   . Pneumovax 23 [Pneumococcal Vac Polyvalent]     Flu like symtpoms for 1 day    Patient Active Problem List   Diagnosis Date Noted  . DOE (dyspnea on exertion) 04/28/2013  . Personal history of colonic polyps 10/28/2012  . Osteopenia 04/29/2012  . Elevated digoxin level 04/29/2012  . Allergic rhinitis, cause unspecified 05/22/2011  . Alcohol dependence in remission 05/22/2011  .  Depression 05/22/2011  . Preventative health care 05/18/2011  . HTN (hypertension) 05/18/2011  . Hyperlipidemia 05/18/2011  . Anemia, iron deficiency 05/18/2011  . GERD (gastroesophageal reflux disease) 05/18/2011  . DDD (degenerative disc disease), lumbar 05/18/2011  . Pectus excavatum 05/18/2011  . Macular degeneration   . OA (osteoarthritis)   . Cholelithiasis   . COPD (chronic obstructive pulmonary disease)   . Paroxysmal SVT (supraventricular tachycardia) 02/07/2011    History  Smoking status  . Former Smoker  . Quit date: 01/24/2003  Smokeless tobacco  . Not on file    History  Alcohol Use No    Family History  Problem Relation Age of Onset  . Heart failure Mother   . Arthritis Mother   . Kidney failure Father   . Heart disease Father   . Hypertension Father     Review of Systems: Constitutional: no  fever chills diaphoresis or fatigue or change in weight.  Head and neck: no hearing loss, no epistaxis, no photophobia or visual disturbance. Respiratory: No cough, shortness of breath or wheezing. Cardiovascular: No chest pain peripheral edema, palpitations. Gastrointestinal: No abdominal distention, no abdominal pain, no change in bowel habits hematochezia or melena. Genitourinary: No dysuria, no frequency, no urgency, no nocturia. Musculoskeletal:No arthralgias, no back pain, no gait disturbance or myalgias. Neurological: No dizziness, no headaches, no numbness, no seizures, no syncope, no weakness, no tremors. Hematologic: No lymphadenopathy, no easy bruising. Psychiatric: No confusion, no hallucinations, no sleep disturbance.    Physical Exam: Filed Vitals:   05/14/13 0906  BP: 110/72  Pulse:    the general appearance reveals a well-developed well-nourished woman in no distress.The head and neck exam reveals pupils equal and reactive.  Extraocular movements are full.  There is no scleral icterus.  The mouth and pharynx are normal.  The neck is supple.  The carotids reveal no bruits.  The jugular venous pressure is normal.  The  thyroid is not enlarged.  There is no lymphadenopathy.  The chest is clear to percussion and auscultation.  There are no rales or rhonchi.  Expansion of the chest is symmetrical.  The precordium is quiet.  The first heart sound is normal.  The second heart sound is physiologically split.  There is no murmur gallop rub or click.  There is no abnormal lift or heave.  The abdomen is soft and nontender.  The bowel sounds are normal.  The liver and spleen are not enlarged.  There are no abdominal masses.  There are no abdominal bruits.  Extremities reveal good pedal pulses.  There is no phlebitis or edema.  There is no cyanosis or clubbing.  Strength is normal and symmetrical in all extremities.  There is no lateralizing weakness.  There are no sensory deficits.  The skin is  warm and dry.  There is no rash.    Assessment / Plan: The patient will continue same medication.  We will update her two-dimensional echocardiogram.  Will be rechecked in 6 months for followup office visit lipid panel hepatic function panel and basal metabolic panel.

## 2013-05-14 NOTE — Patient Instructions (Signed)
Your physician has requested that you have an echocardiogram. Echocardiography is a painless test that uses sound waves to create images of your heart. It provides your doctor with information about the size and shape of your heart and how well your heart's chambers and valves are working. This procedure takes approximately one hour. There are no restrictions for this procedure.  Your physician recommends that you continue on your current medications as directed. Please refer to the Current Medication list given to you today.  Your physician wants you to follow-up in: 6 months with fasting labs (lp/bmet/hfp)  You will receive a reminder letter in the mail two months in advance. If you don't receive a letter, please call our office to schedule the follow-up appointment.

## 2013-05-14 NOTE — Assessment & Plan Note (Signed)
Since we last saw the patient she had one episode of SVT lasting one hour in July.  She also had another episode at the end of August.

## 2013-05-16 ENCOUNTER — Encounter: Payer: Self-pay | Admitting: Internal Medicine

## 2013-05-16 NOTE — Progress Notes (Signed)
Quick Note:  Please report to patient. The recent labs are stable. Continue same medication and careful diet. ______ 

## 2013-05-17 ENCOUNTER — Telehealth: Payer: Self-pay | Admitting: *Deleted

## 2013-05-17 NOTE — Telephone Encounter (Signed)
Pt had requested to receive blood work results in her "pt questionnaire".   Pt aware of blood work results.

## 2013-06-10 ENCOUNTER — Encounter: Payer: Self-pay | Admitting: Cardiology

## 2013-06-10 DIAGNOSIS — R0600 Dyspnea, unspecified: Secondary | ICD-10-CM

## 2013-06-10 DIAGNOSIS — I119 Hypertensive heart disease without heart failure: Secondary | ICD-10-CM

## 2013-06-11 MED ORDER — FUROSEMIDE 40 MG PO TABS
40.0000 mg | ORAL_TABLET | Freq: Every day | ORAL | Status: DC
Start: 2013-06-11 — End: 2014-05-23

## 2013-06-16 ENCOUNTER — Ambulatory Visit (HOSPITAL_COMMUNITY): Payer: Medicare Other | Attending: Cardiology | Admitting: Radiology

## 2013-06-16 ENCOUNTER — Encounter: Payer: Self-pay | Admitting: Cardiovascular Disease

## 2013-06-16 DIAGNOSIS — I471 Supraventricular tachycardia: Secondary | ICD-10-CM

## 2013-06-16 DIAGNOSIS — Q676 Pectus excavatum: Secondary | ICD-10-CM | POA: Insufficient documentation

## 2013-06-16 DIAGNOSIS — I10 Essential (primary) hypertension: Secondary | ICD-10-CM | POA: Insufficient documentation

## 2013-06-16 DIAGNOSIS — Z87891 Personal history of nicotine dependence: Secondary | ICD-10-CM | POA: Insufficient documentation

## 2013-06-16 DIAGNOSIS — I498 Other specified cardiac arrhythmias: Secondary | ICD-10-CM | POA: Insufficient documentation

## 2013-06-16 DIAGNOSIS — F4329 Adjustment disorder with other symptoms: Secondary | ICD-10-CM | POA: Diagnosis not present

## 2013-06-16 DIAGNOSIS — IMO0002 Reserved for concepts with insufficient information to code with codable children: Secondary | ICD-10-CM | POA: Insufficient documentation

## 2013-06-16 DIAGNOSIS — J4489 Other specified chronic obstructive pulmonary disease: Secondary | ICD-10-CM | POA: Insufficient documentation

## 2013-06-16 DIAGNOSIS — R0602 Shortness of breath: Secondary | ICD-10-CM | POA: Insufficient documentation

## 2013-06-16 DIAGNOSIS — E785 Hyperlipidemia, unspecified: Secondary | ICD-10-CM | POA: Insufficient documentation

## 2013-06-16 DIAGNOSIS — R0600 Dyspnea, unspecified: Secondary | ICD-10-CM

## 2013-06-16 DIAGNOSIS — J449 Chronic obstructive pulmonary disease, unspecified: Secondary | ICD-10-CM | POA: Diagnosis not present

## 2013-06-16 DIAGNOSIS — I079 Rheumatic tricuspid valve disease, unspecified: Secondary | ICD-10-CM | POA: Insufficient documentation

## 2013-06-16 NOTE — Progress Notes (Signed)
Echocardiogram performed.  

## 2013-06-18 ENCOUNTER — Telehealth: Payer: Self-pay | Admitting: *Deleted

## 2013-06-18 NOTE — Telephone Encounter (Signed)
Advised patient

## 2013-06-18 NOTE — Telephone Encounter (Signed)
Message copied by Burnell BlanksPRATT, Thomasenia Dowse B on Fri Jun 18, 2013  6:20 PM ------      Message from: Cassell ClementBRACKBILL, THOMAS      Created: Wed Jun 16, 2013  8:23 PM       Echo shows normal systolic function. Valve function is normal. There is mild diastolic dysfunction. ------

## 2013-06-30 ENCOUNTER — Encounter: Payer: Self-pay | Admitting: Internal Medicine

## 2013-06-30 ENCOUNTER — Ambulatory Visit (INDEPENDENT_AMBULATORY_CARE_PROVIDER_SITE_OTHER): Payer: Medicare Other | Admitting: Internal Medicine

## 2013-06-30 VITALS — BP 128/70 | HR 61 | Temp 97.0°F | Ht 64.0 in | Wt 153.1 lb

## 2013-06-30 DIAGNOSIS — R0609 Other forms of dyspnea: Secondary | ICD-10-CM

## 2013-06-30 DIAGNOSIS — R0989 Other specified symptoms and signs involving the circulatory and respiratory systems: Secondary | ICD-10-CM

## 2013-06-30 DIAGNOSIS — F32A Depression, unspecified: Secondary | ICD-10-CM

## 2013-06-30 DIAGNOSIS — F3289 Other specified depressive episodes: Secondary | ICD-10-CM

## 2013-06-30 DIAGNOSIS — I10 Essential (primary) hypertension: Secondary | ICD-10-CM | POA: Diagnosis not present

## 2013-06-30 DIAGNOSIS — Z1231 Encounter for screening mammogram for malignant neoplasm of breast: Secondary | ICD-10-CM

## 2013-06-30 DIAGNOSIS — Z23 Encounter for immunization: Secondary | ICD-10-CM

## 2013-06-30 DIAGNOSIS — F329 Major depressive disorder, single episode, unspecified: Secondary | ICD-10-CM

## 2013-06-30 MED ORDER — TIOTROPIUM BROMIDE MONOHYDRATE 18 MCG IN CAPS: 18.0000 ug | ORAL_CAPSULE | Freq: Every day | RESPIRATORY_TRACT | Status: DC | PRN

## 2013-06-30 NOTE — Progress Notes (Signed)
Subjective:    Patient ID: Alexandra Reyes, female    DOB: 10-24-1925, 80 y.o.   MRN: [id number]  HPI  Here to f/u; overall doing ok,  Pt denies chest pain,  wheezing, orthopnea, PND, increased LE swelling, palpitations, dizziness or syncope, but has intermittent mild DOE, no signficant sob at rest.   Pt denies polydipsia, polyuria,  Pt denies new neurological symptoms such as new headache, or facial or extremity weakness or numbness.   Pt states overall good compliance with meds, has been trying to follow lower cholesterol, diabetic diet, with wt overall stable,  but little exercise however. Denies worsening depressive symptoms, suicidal ideation, or panic.  Overall good compliance with treatment, and good medicine tolerability.   Pt denies fever, wt loss, night sweats, loss of appetite, or other constitutional symptoms Past Medical History  Diagnosis Date  . PAT (paroxysmal atrial tachycardia)   . DOE (dyspnea on exertion)   . Chest pressure   . Macular degeneration   . OA (osteoarthritis)   . Hiatal hernia   . GERD (gastroesophageal reflux disease)   . Cholelithiasis   . Dyspnea   . COPD (chronic obstructive pulmonary disease)   . HTN (hypertension) 05/18/2011  . Hyperlipidemia 05/18/2011  . Osteoporosis 05/18/2011  . Anemia, iron deficiency 05/18/2011  . DDD (degenerative disc disease), lumbar 05/18/2011  . Pectus excavatum 05/18/2011  . Allergy   . Allergic rhinitis, cause unspecified 05/22/2011  . Alcohol dependence in remission 05/22/2011  . Personal history of colonic polyps 10/28/2012   Past Surgical History  Procedure Laterality Date  . Tonsillectomy and adenoidectomy    . Breast lumpectomy    . US echocardiography  01/17/2003    EF 55-60%  . Cardiovascular stress test  03/08/2009    EF 84%  . Abdominal hysterectomy  1988  . Breast biopsy  1948    reports that she quit smoking about 10 years ago. She does not have any smokeless tobacco history on file. She reports that she  does not drink alcohol or use illicit drugs. family history includes Arthritis in her mother; Heart disease in her father; Heart failure in her mother; Hypertension in her father; Kidney failure in her father. Allergies  Allergen Reactions  . Lisinopril   . Pneumovax 23 [Pneumococcal Vac Polyvalent]     Flu like symtpoms for 1 day   Current Outpatient Prescriptions on File Prior to Visit  Medication Sig Dispense Refill  . albuterol (PROVENTIL HFA;VENTOLIN HFA) 108 (90 BASE) MCG/ACT inhaler Inhale 2 puffs into the lungs every 6 (six) hours as needed for wheezing or shortness of breath.  1 Inhaler  11  . ALPRAZolam (XANAX) 0.25 MG tablet TAKE ONE TABLET BY MOUTH DAILY AS NEEDED   30 tablet  1  . amLODipine (NORVASC) 2.5 MG tablet Take 1 tablet (2.5 mg total) by mouth daily.  90 tablet  3  . Ascorbic Acid (VITAMIN C PO) Take by mouth.        Marland Kitchen aspirin 81 MG tablet Take 81 mg by mouth daily.        Marland Kitchen atenolol (TENORMIN) 25 MG tablet Take 1 tablet (25 mg total) by mouth 2 (two) times daily.  100 tablet  PRN  . B Complex Vitamins (VITAMIN B COMPLEX PO) Take by mouth daily.        . calcium carbonate (CALCIUM 600) 600 MG TABS Take 600 mg by mouth daily.        . digoxin (LANOXIN) 0.25 MG  tablet Take 0.5 tablets (0.125 mg total) by mouth daily.  45 tablet  6  . ferrous sulfate 325 (65 FE) MG tablet Take 325 mg by mouth daily.       . furosemide (LASIX) 40 MG tablet Take 1 tablet (40 mg total) by mouth daily.  90 tablet  3  . Glucosamine HCl-MSM (GLUCOSAMINE-MSM PO) Take 3 tablets by mouth daily.      Marland Kitchen. ibuprofen (ADVIL,MOTRIN) 200 MG tablet Take 200 mg by mouth every 6 (six) hours as needed.        . simvastatin (ZOCOR) 20 MG tablet Take 1 tablet (20 mg total) by mouth at bedtime.  90 tablet  3  . vitamin E 200 UNIT capsule Take 200 Units by mouth daily.       No current facility-administered medications on file prior to visit.    Review of Systems  Constitutional: Negative for unexpected  weight change, or unusual diaphoresis  HENT: Negative for tinnitus.   Eyes: Negative for photophobia and visual disturbance.  Respiratory: Negative for choking and stridor.   Gastrointestinal: Negative for vomiting and blood in stool.  Genitourinary: Negative for hematuria and decreased urine volume.  Musculoskeletal: Negative for acute joint swelling Skin: Negative for color change and wound.  Neurological: Negative for tremors and numbness other than noted  Psychiatric/Behavioral: Negative for decreased concentration or  hyperactivity.       Objective:   Physical Exam BP 128/70  Pulse 61  Temp(Src) 97 F (36.1 C) (Oral)  Ht 5\' 4"  (1.626 m)  Wt 153 lb 2 oz (69.457 kg)  BMI 26.27 kg/m2  SpO2 95% VS noted, not ill appearing Constitutional: Pt appears well-developed and well-nourished.  HENT: Head: NCAT.  Right Ear: External ear normal.  Left Ear: External ear normal.  Eyes: Conjunctivae and EOM are normal. Pupils are equal, round, and reactive to light.  Neck: Normal range of motion. Neck supple.  Cardiovascular: Normal rate and regular rhythm.   Pulmonary/Chest: Effort normal and breath sounds decreased, no rales or wheezing Neurological: Pt is alert. Not confused  Skin: Skin is warm. No erythema.  Psychiatric: Pt behavior is normal. Thought content normal. 1+ nervous, not depressed affect    Assessment & Plan:

## 2013-06-30 NOTE — Patient Instructions (Addendum)
You had the new Prevnar pneumonia shot today Please continue all other medications as before, including the spiriva as needed if you feel it helps Please have the pharmacy call with any other refills you may need. Please continue your efforts at being more active, low cholesterol diet, and weight control. You are otherwise up to date with prevention measures today.  You will be contacted regarding the referral for: mammogram for march or after  Please return in 6 months, or sooner if needed

## 2013-06-30 NOTE — Progress Notes (Signed)
Pre-visit discussion using our clinic review tool. No additional management support is needed unless otherwise documented below in the visit note.  

## 2013-07-02 DIAGNOSIS — H35059 Retinal neovascularization, unspecified, unspecified eye: Secondary | ICD-10-CM | POA: Diagnosis not present

## 2013-07-02 DIAGNOSIS — H43819 Vitreous degeneration, unspecified eye: Secondary | ICD-10-CM | POA: Diagnosis not present

## 2013-07-02 DIAGNOSIS — H353290 Exudative age-related macular degeneration, unspecified eye, stage unspecified: Secondary | ICD-10-CM | POA: Diagnosis not present

## 2013-07-06 NOTE — Assessment & Plan Note (Signed)
stable overall by history and exam, recent data reviewed with pt, and pt to continue medical treatment as before,  to f/u any worsening symptoms or concerns Lab Results  Component Value Date   WBC 6.6 05/14/2013   HGB 13.1 05/14/2013   HCT 39.5 05/14/2013   PLT 328.0 05/14/2013   GLUCOSE 92 05/14/2013   CHOL 180 05/14/2013   TRIG 132.0 05/14/2013   HDL 50.00 05/14/2013   LDLDIRECT 129.2 05/15/2011   LDLCALC 104* 05/14/2013   ALT 22 05/14/2013   AST 19 05/14/2013   NA 133* 05/14/2013   K 4.0 05/14/2013   CL 100 05/14/2013   CREATININE 0.7 05/14/2013   BUN 12 05/14/2013   CO2 26 05/14/2013   TSH 2.45 04/29/2012   INR 1.0 07/13/2007   HGBA1C  Value: 5.8 (NOTE)   The ADA recommends the following therapeutic goals for glycemic   control related to Hgb A1C measurement:   Goal of Therapy:   < 7.0% Hgb A1C   Action Suggested:  > 8.0% Hgb A1C   Ref:  Diabetes Care, 22, Suppl. 1, 1999 07/13/2007

## 2013-07-06 NOTE — Assessment & Plan Note (Signed)
Mild, for trial spiriva, gave sample today, delicnes pft's for now,  to f/u any worsening symptoms or concerns

## 2013-07-06 NOTE — Assessment & Plan Note (Signed)
>>  ASSESSMENT AND PLAN FOR HTN (HYPERTENSION) WRITTEN ON 07/06/2013 10:52 PM BY [name], [name], MD  stable overall by history and exam, recent data reviewed with pt, and pt to continue medical treatment as before,  to f/u any worsening symptoms or concerns Lab Results  Component Value Date   WBC 6.6 05/14/2013   HGB 13.1 05/14/2013   HCT 39.5 05/14/2013   PLT 328.0 05/14/2013   GLUCOSE 92 05/14/2013   CHOL 180 05/14/2013   TRIG 132.0 05/14/2013   HDL 50.00 05/14/2013   LDLDIRECT 129.2 05/15/2011   LDLCALC 104* 05/14/2013   ALT 22 05/14/2013   AST 19 05/14/2013   NA 133* 05/14/2013   K 4.0 05/14/2013   CL 100 05/14/2013   CREATININE 0.7 05/14/2013   BUN 12 05/14/2013   CO2 26 05/14/2013   TSH 2.45 04/29/2012   INR 1.0 07/13/2007   HGBA1C  Value: 5.8 (NOTE)   The ADA recommends the following therapeutic goals for glycemic   control related to Hgb A1C measurement:   Goal of Therapy:   < 7.0% Hgb A1C   Action Suggested:  > 8.0% Hgb A1C   Ref:  Diabetes Care, 22, Suppl. 1, 1999 07/13/2007

## 2013-07-06 NOTE — Assessment & Plan Note (Signed)
stable overall by history and exam, and pt to continue medical treatment as before,  to f/u any worsening symptoms or concerns 

## 2013-07-07 DIAGNOSIS — F4329 Adjustment disorder with other symptoms: Secondary | ICD-10-CM | POA: Diagnosis not present

## 2013-08-11 DIAGNOSIS — F4329 Adjustment disorder with other symptoms: Secondary | ICD-10-CM | POA: Diagnosis not present

## 2013-08-25 DIAGNOSIS — H353290 Exudative age-related macular degeneration, unspecified eye, stage unspecified: Secondary | ICD-10-CM | POA: Diagnosis not present

## 2013-08-25 DIAGNOSIS — H35059 Retinal neovascularization, unspecified, unspecified eye: Secondary | ICD-10-CM | POA: Diagnosis not present

## 2013-09-01 DIAGNOSIS — F4329 Adjustment disorder with other symptoms: Secondary | ICD-10-CM | POA: Diagnosis not present

## 2013-09-27 ENCOUNTER — Other Ambulatory Visit: Payer: Self-pay | Admitting: Cardiology

## 2013-09-27 DIAGNOSIS — Z1231 Encounter for screening mammogram for malignant neoplasm of breast: Secondary | ICD-10-CM | POA: Diagnosis not present

## 2013-09-27 DIAGNOSIS — M949 Disorder of cartilage, unspecified: Secondary | ICD-10-CM | POA: Diagnosis not present

## 2013-09-27 DIAGNOSIS — M899 Disorder of bone, unspecified: Secondary | ICD-10-CM | POA: Diagnosis not present

## 2013-10-06 DIAGNOSIS — M545 Low back pain, unspecified: Secondary | ICD-10-CM | POA: Diagnosis not present

## 2013-10-06 DIAGNOSIS — F4329 Adjustment disorder with other symptoms: Secondary | ICD-10-CM | POA: Diagnosis not present

## 2013-10-06 DIAGNOSIS — M5137 Other intervertebral disc degeneration, lumbosacral region: Secondary | ICD-10-CM | POA: Diagnosis not present

## 2013-10-13 ENCOUNTER — Encounter: Payer: Self-pay | Admitting: Cardiology

## 2013-10-13 ENCOUNTER — Encounter: Payer: Self-pay | Admitting: Internal Medicine

## 2013-10-14 ENCOUNTER — Telehealth: Payer: Self-pay | Admitting: *Deleted

## 2013-10-14 NOTE — Telephone Encounter (Signed)
Message copied by Burnell BlanksPRATT, Marybeth Dandy B on Thu Oct 14, 2013  4:39 PM ------      Message from: Cassell ClementBRACKBILL, THOMAS      Created: Thu Oct 14, 2013  8:16 AM       Please report.  The bone density test is stable. ------

## 2013-10-14 NOTE — Telephone Encounter (Signed)
Advised patient

## 2013-10-22 DIAGNOSIS — H35059 Retinal neovascularization, unspecified, unspecified eye: Secondary | ICD-10-CM | POA: Diagnosis not present

## 2013-10-22 DIAGNOSIS — H353290 Exudative age-related macular degeneration, unspecified eye, stage unspecified: Secondary | ICD-10-CM | POA: Diagnosis not present

## 2013-10-27 DIAGNOSIS — M5137 Other intervertebral disc degeneration, lumbosacral region: Secondary | ICD-10-CM | POA: Diagnosis not present

## 2013-10-27 DIAGNOSIS — F4329 Adjustment disorder with other symptoms: Secondary | ICD-10-CM | POA: Diagnosis not present

## 2013-10-27 DIAGNOSIS — M545 Low back pain, unspecified: Secondary | ICD-10-CM | POA: Diagnosis not present

## 2013-11-15 ENCOUNTER — Other Ambulatory Visit: Payer: Self-pay | Admitting: Cardiology

## 2013-11-24 DIAGNOSIS — Z5189 Encounter for other specified aftercare: Secondary | ICD-10-CM | POA: Diagnosis not present

## 2013-11-24 DIAGNOSIS — M5137 Other intervertebral disc degeneration, lumbosacral region: Secondary | ICD-10-CM | POA: Diagnosis not present

## 2013-12-01 DIAGNOSIS — F4329 Adjustment disorder with other symptoms: Secondary | ICD-10-CM | POA: Diagnosis not present

## 2013-12-02 DIAGNOSIS — Z961 Presence of intraocular lens: Secondary | ICD-10-CM | POA: Diagnosis not present

## 2013-12-02 DIAGNOSIS — H353190 Nonexudative age-related macular degeneration, unspecified eye, stage unspecified: Secondary | ICD-10-CM | POA: Diagnosis not present

## 2013-12-03 ENCOUNTER — Encounter: Payer: Self-pay | Admitting: Cardiology

## 2013-12-03 DIAGNOSIS — I471 Supraventricular tachycardia: Secondary | ICD-10-CM

## 2013-12-03 MED ORDER — DIGOXIN 125 MCG PO TABS: 0.1250 mg | ORAL_TABLET | Freq: Every day | ORAL | Status: DC

## 2013-12-06 ENCOUNTER — Other Ambulatory Visit: Payer: Self-pay

## 2013-12-06 DIAGNOSIS — I471 Supraventricular tachycardia: Secondary | ICD-10-CM

## 2013-12-06 MED ORDER — DIGOXIN 125 MCG PO TABS: 0.1250 mg | ORAL_TABLET | Freq: Every day | ORAL | Status: DC

## 2013-12-08 ENCOUNTER — Encounter: Payer: Self-pay | Admitting: Cardiology

## 2013-12-08 ENCOUNTER — Other Ambulatory Visit: Payer: Self-pay

## 2013-12-08 ENCOUNTER — Ambulatory Visit (INDEPENDENT_AMBULATORY_CARE_PROVIDER_SITE_OTHER): Payer: Medicare Other | Admitting: Cardiology

## 2013-12-08 VITALS — BP 142/61 | HR 51 | Ht 64.0 in | Wt 151.0 lb

## 2013-12-08 DIAGNOSIS — I119 Hypertensive heart disease without heart failure: Secondary | ICD-10-CM

## 2013-12-08 DIAGNOSIS — I471 Supraventricular tachycardia, unspecified: Secondary | ICD-10-CM

## 2013-12-08 DIAGNOSIS — I10 Essential (primary) hypertension: Secondary | ICD-10-CM | POA: Diagnosis not present

## 2013-12-08 DIAGNOSIS — R0609 Other forms of dyspnea: Secondary | ICD-10-CM

## 2013-12-08 DIAGNOSIS — Q676 Pectus excavatum: Secondary | ICD-10-CM

## 2013-12-08 DIAGNOSIS — R0989 Other specified symptoms and signs involving the circulatory and respiratory systems: Secondary | ICD-10-CM

## 2013-12-08 DIAGNOSIS — R0600 Dyspnea, unspecified: Secondary | ICD-10-CM

## 2013-12-08 MED ORDER — DIGOXIN 125 MCG PO TABS: 0.1250 mg | ORAL_TABLET | Freq: Every day | ORAL | Status: DC

## 2013-12-08 NOTE — Assessment & Plan Note (Signed)
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  She has not had prior myocardial infarctions.  The EKG pattern is reflective of her pectus excavatum

## 2013-12-08 NOTE — Assessment & Plan Note (Signed)
She has not been having any symptoms of congestive heart failure.  No dizziness or syncope.  No palpitations.

## 2013-12-08 NOTE — Assessment & Plan Note (Signed)
>>  ASSESSMENT AND PLAN FOR HTN (HYPERTENSION) WRITTEN ON 12/08/2013  2:21 PM BY Cassell ClementBRACKBILL, THOMAS, MD  She has not been having any symptoms of congestive heart failure.  No dizziness or syncope.  No palpitations.

## 2013-12-08 NOTE — Assessment & Plan Note (Signed)
She has not had any recent episodes of SVT.  She remains on digoxin 0.125 mg daily.

## 2013-12-08 NOTE — Progress Notes (Signed)
Alexandra SpikeFlorence C Reyes Date of Birth:  June 17, 1925 [facility] [address] [place], [place]  [address] [phone]        Fax   [phone]   History of Present Illness: This pleasant 80 year old widowed woman is seen for a scheduled 6 month followup office visit. She has a past history of diastolic dysfunction with dyspnea and a history of paroxysmal supraventricular tachycardia. She does not have any history of ischemic heart disease. She had a normal treadmill Cardiolite stress test in 2010. She has not had cardiac catheterization. Echocardiogram in 2004 showed diastolic dysfunction and trace mitral regurgitation. She has a history of significant macular degeneration and is followed by [name]. She has been having more shortness of breath recently. She had a chest x-ray on 04/28/13 which showed hyperinflation. She has had pulmonary function studies through Dr. Raphael GibneyJohn's office.  Initially she was felt to have possible COPD.  Subsequently she was told that she did not have COPD after she had her pulmonary function studies.  She uses albuterol infrequently.  She is taking Spiriva every 48 hours instead of every day. Her last echocardiogram on 06/16/13 showed normal left ventricular systolic function with an ejection fraction of 60-65% and there was mild diastolic dysfunction and there were no valve abnormalities.  Current Outpatient Prescriptions  Medication Sig Dispense Refill  . albuterol (PROVENTIL HFA;VENTOLIN HFA) 108 (90 BASE) MCG/ACT inhaler Inhale 2 puffs into the lungs every 6 (six) hours as needed for wheezing or shortness of breath.  1 Inhaler  11  . ALPRAZolam (XANAX) 0.25 MG tablet TAKE ONE TABLET BY MOUTH DAILY AS NEEDED   30 tablet  1  . amLODipine (NORVASC) 2.5 MG tablet Take one tablet by mouth one time daily  90 tablet  0  . Ascorbic Acid (VITAMIN C PO) Take by mouth.        Marland Kitchen. aspirin 81 MG tablet Take 81 mg by mouth daily.        Marland Kitchen. atenolol (TENORMIN) 25 MG  tablet Take one tablet by mouth twice daily  100 tablet  PRN  . B Complex Vitamins (VITAMIN B COMPLEX PO) Take by mouth daily.        . calcium carbonate (CALCIUM 600) 600 MG TABS Take 600 mg by mouth daily.        . digoxin (LANOXIN) 0.125 MG tablet Take 1 tablet (0.125 mg total) by mouth daily.  90 tablet  3  . ferrous sulfate 325 (65 FE) MG tablet Take 325 mg by mouth daily.       . furosemide (LASIX) 40 MG tablet Take 1 tablet (40 mg total) by mouth daily.  90 tablet  3  . Glucosamine HCl-MSM (GLUCOSAMINE-MSM PO) Take 3 tablets by mouth daily.      Marland Kitchen. ibuprofen (ADVIL,MOTRIN) 200 MG tablet Take 200 mg by mouth every 6 (six) hours as needed.        . simvastatin (ZOCOR) 20 MG tablet Take 1 tablet (20 mg total) by mouth at bedtime.  90 tablet  3  . tiotropium (SPIRIVA HANDIHALER) 18 MCG inhalation capsule Place 1 capsule (18 mcg total) into inhaler and inhale daily as needed.  30 capsule  12  . vitamin E 200 UNIT capsule Take 200 Units by mouth daily.       No current facility-administered medications for this visit.    Allergies  Allergen Reactions  . Lisinopril   . Pneumovax 23 [Pneumococcal Vac Polyvalent]  Flu like symtpoms for 1 day    Patient Active Problem List   Diagnosis Date Noted  . Dyslipidemia 05/14/2013  . Dyspnea on exertion 05/14/2013  . DOE (dyspnea on exertion) 04/28/2013  . Personal history of colonic polyps 10/28/2012  . Osteopenia 04/29/2012  . Elevated digoxin level 04/29/2012  . Allergic rhinitis, cause unspecified 05/22/2011  . Alcohol dependence in remission 05/22/2011  . Depression 05/22/2011  . Preventative health care 05/18/2011  . HTN (hypertension) 05/18/2011  . Hyperlipidemia 05/18/2011  . Anemia, iron deficiency 05/18/2011  . GERD (gastroesophageal reflux disease) 05/18/2011  . DDD (degenerative disc disease), lumbar 05/18/2011  . Pectus excavatum 05/18/2011  . Macular degeneration   . OA (osteoarthritis)   . Cholelithiasis   . COPD  (chronic obstructive pulmonary disease)   . Paroxysmal SVT (supraventricular tachycardia) 02/07/2011    History  Smoking status  . Former Smoker  . Quit date: 01/24/2003  Smokeless tobacco  . Not on file    History  Alcohol Use No    Family History  Problem Relation Age of Onset  . Heart failure Mother   . Arthritis Mother   . Kidney failure Father   . Heart disease Father   . Hypertension Father     Review of Systems: Constitutional: no fever chills diaphoresis or fatigue or change in weight.  Head and neck: no hearing loss, no epistaxis, no photophobia or visual disturbance. Respiratory: No cough, shortness of breath or wheezing. Cardiovascular: No chest pain peripheral edema, palpitations. Gastrointestinal: No abdominal distention, no abdominal pain, no change in bowel habits hematochezia or melena. Genitourinary: No dysuria, no frequency, no urgency, no nocturia. Musculoskeletal:No arthralgias, no back pain, no gait disturbance or myalgias. Neurological: No dizziness, no headaches, no numbness, no seizures, no syncope, no weakness, no tremors. Hematologic: No lymphadenopathy, no easy bruising. Psychiatric: No confusion, no hallucinations, no sleep disturbance.    Physical Exam: Filed Vitals:   12/08/13 1034  BP: 142/61  Pulse: 51   General appearance reveals a well-developed well-nourished woman in no distress.The head and neck exam reveals pupils equal and reactive.  Extraocular movements are full.  There is no scleral icterus.  The mouth and pharynx are normal.  The neck is supple.  The carotids reveal no bruits.  The jugular venous pressure is normal.  The  thyroid is not enlarged.  There is no lymphadenopathy.  The chest is clear to percussion and auscultation.  There are no rales or rhonchi.  Expansion of the chest is symmetrical.  The precordium is quiet.  The first heart sound is normal.  The second heart sound is physiologically split.  There is soft grade 1/6  systolic ejection murmur at the base. There is no abnormal lift or heave.  The abdomen is soft and nontender.  The bowel sounds are normal.  The liver and spleen are not enlarged.  There are no abdominal masses.  There are no abdominal bruits.  Extremities reveal good pedal pulses.  There is no phlebitis or edema.  There is no cyanosis or clubbing.  Strength is normal and symmetrical in all extremities.  There is no lateralizing weakness.  There are no sensory deficits.  The skin is warm and dry.  There is no rash.  EKG shows normal sinus rhythm and inferior wall Q waves and anteroseptal Q waves unchanged from prior tracings.  These reflect her pectus excavatum.  Assessment / Plan: 1. essential hypertension 2. history of paroxysmal supraventricular tachycardia, infrequent 3. pectus excavatum 4.  Hyperlipidemia 5. dyspnea on exertion.  Echocardiogram shows normal systolic function and grade 1 diastolic dysfunction.  Plan: Continue on same medication.  Recheck in 6 months for office visit lipid panel hepatic function panel and basal metabolic panel

## 2013-12-08 NOTE — Patient Instructions (Signed)
Your physician recommends that you continue on your current medications as directed. Please refer to the Current Medication list given to you today.  Your physician wants you to follow-up in: 6 months with fasting labs (lp/bmet/hfp)  You will receive a reminder letter in the mail two months in advance. If you don't receive a letter, please call our office to schedule the follow-up appointment.  

## 2013-12-08 NOTE — Assessment & Plan Note (Signed)
The patient has a history of osteoarthritis she has been seeing [facility].  She will be having an MRI of her back soon.

## 2013-12-13 ENCOUNTER — Encounter: Payer: Self-pay | Admitting: Cardiology

## 2013-12-13 DIAGNOSIS — M545 Low back pain, unspecified: Secondary | ICD-10-CM | POA: Diagnosis not present

## 2013-12-13 DIAGNOSIS — Z5189 Encounter for other specified aftercare: Secondary | ICD-10-CM | POA: Diagnosis not present

## 2013-12-15 ENCOUNTER — Other Ambulatory Visit: Payer: Self-pay

## 2013-12-15 MED ORDER — DIGOXIN 125 MCG PO TABS: 0.1250 mg | ORAL_TABLET | Freq: Every day | ORAL | Status: DC

## 2013-12-20 ENCOUNTER — Other Ambulatory Visit: Payer: Self-pay | Admitting: Cardiology

## 2013-12-20 DIAGNOSIS — F419 Anxiety disorder, unspecified: Secondary | ICD-10-CM

## 2013-12-22 DIAGNOSIS — H35059 Retinal neovascularization, unspecified, unspecified eye: Secondary | ICD-10-CM | POA: Diagnosis not present

## 2013-12-22 DIAGNOSIS — H353290 Exudative age-related macular degeneration, unspecified eye, stage unspecified: Secondary | ICD-10-CM | POA: Diagnosis not present

## 2013-12-24 DIAGNOSIS — M5137 Other intervertebral disc degeneration, lumbosacral region: Secondary | ICD-10-CM | POA: Diagnosis not present

## 2013-12-24 DIAGNOSIS — Z5189 Encounter for other specified aftercare: Secondary | ICD-10-CM | POA: Diagnosis not present

## 2013-12-27 ENCOUNTER — Other Ambulatory Visit: Payer: Self-pay | Admitting: Cardiology

## 2013-12-29 ENCOUNTER — Encounter: Payer: Self-pay | Admitting: Internal Medicine

## 2013-12-29 ENCOUNTER — Ambulatory Visit (INDEPENDENT_AMBULATORY_CARE_PROVIDER_SITE_OTHER): Payer: Medicare Other | Admitting: Internal Medicine

## 2013-12-29 VITALS — BP 138/70 | HR 63 | Temp 97.4°F | Ht 64.0 in | Wt 151.0 lb

## 2013-12-29 DIAGNOSIS — F329 Major depressive disorder, single episode, unspecified: Secondary | ICD-10-CM

## 2013-12-29 DIAGNOSIS — F3289 Other specified depressive episodes: Secondary | ICD-10-CM

## 2013-12-29 DIAGNOSIS — F32A Depression, unspecified: Secondary | ICD-10-CM

## 2013-12-29 DIAGNOSIS — I10 Essential (primary) hypertension: Secondary | ICD-10-CM

## 2013-12-29 DIAGNOSIS — E785 Hyperlipidemia, unspecified: Secondary | ICD-10-CM | POA: Diagnosis not present

## 2013-12-29 MED ORDER — TRIAMCINOLONE ACETONIDE 0.1 % EX CREA: 1.0000 "application " | TOPICAL_CREAM | Freq: Two times a day (BID) | CUTANEOUS | Status: DC

## 2013-12-29 NOTE — Assessment & Plan Note (Signed)
stable overall by history and exam, recent data reviewed with pt, and pt to continue medical treatment as before,  to f/u any worsening symptoms or concerns Lab Results  Component Value Date   LDLCALC 104* 05/14/2013

## 2013-12-29 NOTE — Patient Instructions (Addendum)
Please take all new medication as prescribed - the cream  Please continue all other medications as before, and refills have been done if requested.  Please have the pharmacy call with any other refills you may need.  Please continue your efforts at being more active, low cholesterol diet, and weight control.  You are otherwise up to date with prevention measures today.  Please keep your appointments with your specialists as you may have planned  Please return in 6 months, or sooner if needed

## 2013-12-29 NOTE — Progress Notes (Signed)
Pre visit review using our clinic review tool, if applicable. No additional management support is needed unless otherwise documented below in the visit note. 

## 2013-12-29 NOTE — Assessment & Plan Note (Signed)
stable overall by history and exam, recent data reviewed with pt, and pt to continue medical treatment as before,  to f/u any worsening symptoms or concerns BP Readings from Last 3 Encounters:  12/29/13 138/70  12/08/13 142/61  06/30/13 128/70

## 2013-12-29 NOTE — Progress Notes (Signed)
Subjective:    Patient ID: Alexandra Reyes, female    DOB: Dec 19, 1925, 80 y.o.   MRN: [id number]  HPI  Here to f/u; overall doing ok,  Pt denies chest pain, increased sob or doe, wheezing, orthopnea, PND, increased LE swelling, palpitations, dizziness or syncope.  Pt denies polydipsia, polyuria, or low sugar symptoms such as weakness or confusion improved with po intake.  Pt denies new neurological symptoms such as new headache, or facial or extremity weakness or numbness.   Pt states overall good compliance with meds, has been trying to follow lower cholesterol diet, with wt overall stable,  but little exercise however. Pt continues to have recurring LBP without change in severity, bowel or bladder change, fever, wt loss,  worsening LE pain/numbness/weakness, gait change or falls. Seeing Dr Penni Bombard Gaylord Shih, but pain still hurts more to sit, also has spinal stenosis and lumbar deg dz.  Recent MRI with left hamstring prox rupture, has PT to do, possible injections, no surgury planned, gets tramadol prn per ortho. Has some occas right thigh radicular pain. Volunteers for hospice, Denies worsening depressive symptoms, suicidal ideation, or panic.  Has dry skin and mult bug bites, asks for triam cr prn.  Plans to start glucosamine soon. Past Medical History  Diagnosis Date  . PAT (paroxysmal atrial tachycardia)   . DOE (dyspnea on exertion)   . Chest pressure   . Macular degeneration   . OA (osteoarthritis)   . Hiatal hernia   . GERD (gastroesophageal reflux disease)   . Cholelithiasis   . Dyspnea   . COPD (chronic obstructive pulmonary disease)   . HTN (hypertension) 05/18/2011  . Hyperlipidemia 05/18/2011  . Osteoporosis 05/18/2011  . Anemia, iron deficiency 05/18/2011  . DDD (degenerative disc disease), lumbar 05/18/2011  . Pectus excavatum 05/18/2011  . Allergy   . Allergic rhinitis, cause unspecified 05/22/2011  . Alcohol dependence in remission 05/22/2011  . Personal history of colonic polyps  10/28/2012   Past Surgical History  Procedure Laterality Date  . Tonsillectomy and adenoidectomy    . Breast lumpectomy    . US echocardiography  01/17/2003    EF 55-60%  . Cardiovascular stress test  03/08/2009    EF 84%  . Abdominal hysterectomy  1988  . Breast biopsy  1948    reports that she quit smoking about 10 years ago. She does not have any smokeless tobacco history on file. She reports that she does not drink alcohol or use illicit drugs. family history includes Arthritis in her mother; Heart disease in her father; Heart failure in her mother; Hypertension in her father; Kidney failure in her father. Allergies  Allergen Reactions  . Lisinopril   . Pneumovax 23 [Pneumococcal Vac Polyvalent]     Flu like symtpoms for 1 day   Current Outpatient Prescriptions on File Prior to Visit  Medication Sig Dispense Refill  . ALPRAZolam (XANAX) 0.25 MG tablet TAKE ONE TABLET BY MOUTH DAILY AS NEEDED   30 tablet  5  . amLODipine (NORVASC) 2.5 MG tablet TAKE ONE TABLET BY MOUTH ONE TIME DAILY   90 tablet  1  . Ascorbic Acid (VITAMIN C PO) Take by mouth.        Marland Kitchen aspirin 81 MG tablet Take 81 mg by mouth daily.        Marland Kitchen atenolol (TENORMIN) 25 MG tablet Take one tablet by mouth twice daily  100 tablet  PRN  . B Complex Vitamins (VITAMIN B COMPLEX PO) Take by mouth  daily.        . calcium carbonate (CALCIUM 600) 600 MG TABS Take 600 mg by mouth daily.        . digoxin (LANOXIN) 0.125 MG tablet Take 1 tablet (0.125 mg total) by mouth daily.  14 tablet  0  . ferrous sulfate 325 (65 FE) MG tablet Take 325 mg by mouth daily.       . furosemide (LASIX) 40 MG tablet Take 1 tablet (40 mg total) by mouth daily.  90 tablet  3  . Glucosamine HCl-MSM (GLUCOSAMINE-MSM PO) Take 3 tablets by mouth daily.      Marland Kitchen. ibuprofen (ADVIL,MOTRIN) 200 MG tablet Take 200 mg by mouth every 6 (six) hours as needed.        . simvastatin (ZOCOR) 20 MG tablet Take 1 tablet (20 mg total) by mouth at bedtime.  90 tablet  3  .  tiotropium (SPIRIVA HANDIHALER) 18 MCG inhalation capsule Place 1 capsule (18 mcg total) into inhaler and inhale daily as needed.  30 capsule  12  . vitamin E 200 UNIT capsule Take 200 Units by mouth daily.       No current facility-administered medications on file prior to visit.    Review of Systems  Constitutional: Negative for unusual diaphoresis or other sweats  HENT: Negative for ringing in ear Eyes: Negative for double vision or worsening visual disturbance.  Respiratory: Negative for choking and stridor.   Gastrointestinal: Negative for vomiting or other signifcant bowel change Genitourinary: Negative for hematuria or decreased urine volume.  Musculoskeletal: Negative for other MSK pain or swelling Skin: Negative for color change and worsening wound.  Neurological: Negative for tremors and numbness other than noted  Psychiatric/Behavioral: Negative for decreased concentration or agitation other than above       Objective:   Physical Exam BP 138/70  Pulse 63  Temp(Src) 97.4 F (36.3 C) (Oral)  Ht 5\' 4"  (1.626 m)  Wt 151 lb (68.493 kg)  BMI 25.91 kg/m2  SpO2 94% VS noted,  Constitutional: Pt appears well-developed, well-nourished.  HENT: Head: NCAT.  Right Ear: External ear normal.  Left Ear: External ear normal.  Eyes: . Pupils are equal, round, and reactive to light. Conjunctivae and EOM are normal Neck: Normal range of motion. Neck supple.  Cardiovascular: Normal rate and regular rhythm.   Pulmonary/Chest: Effort normal and breath sounds normal.  Abd:  Soft, NT, ND, + BS Neurological: Pt is alert. Not confused , motor grossly intact Skin: Skin is warm. No rash Psychiatric: Pt behavior is normal. No agitation. not depressed affect    Assessment & Plan:

## 2013-12-29 NOTE — Assessment & Plan Note (Signed)
stable overall by history and exam, recent data reviewed with pt, and pt to continue medical treatment as before,  to f/u any worsening symptoms or concerns Lab Results  Component Value Date   WBC 6.6 05/14/2013   HGB 13.1 05/14/2013   HCT 39.5 05/14/2013   PLT 328.0 05/14/2013   GLUCOSE 92 05/14/2013   CHOL 180 05/14/2013   TRIG 132.0 05/14/2013   HDL 50.00 05/14/2013   LDLDIRECT 129.2 05/15/2011   LDLCALC 104* 05/14/2013   ALT 22 05/14/2013   AST 19 05/14/2013   NA 133* 05/14/2013   K 4.0 05/14/2013   CL 100 05/14/2013   CREATININE 0.7 05/14/2013   BUN 12 05/14/2013   CO2 26 05/14/2013   TSH 2.45 04/29/2012   INR 1.0 07/13/2007   HGBA1C  Value: 5.8 (NOTE)   The ADA recommends the following therapeutic goals for glycemic   control related to Hgb A1C measurement:   Goal of Therapy:   < 7.0% Hgb A1C   Action Suggested:  > 8.0% Hgb A1C   Ref:  Diabetes Care, 22, Suppl. 1, 1999 07/13/2007   Declines need for counseling

## 2013-12-29 NOTE — Assessment & Plan Note (Signed)
>>  ASSESSMENT AND PLAN FOR HTN (HYPERTENSION) WRITTEN ON 12/29/2013 11:42 AM BY [name], [name], MD  stable overall by history and exam, recent data reviewed with pt, and pt to continue medical treatment as before,  to f/u any worsening symptoms or concerns BP Readings from Last 3 Encounters:  12/29/13 138/70  12/08/13 142/61  06/30/13 128/70

## 2013-12-30 ENCOUNTER — Telehealth: Payer: Self-pay | Admitting: Internal Medicine

## 2013-12-30 NOTE — Telephone Encounter (Signed)
Relevant patient education assigned to patient using Emmi. ° °

## 2014-01-05 DIAGNOSIS — M5137 Other intervertebral disc degeneration, lumbosacral region: Secondary | ICD-10-CM | POA: Diagnosis not present

## 2014-01-10 DIAGNOSIS — M5137 Other intervertebral disc degeneration, lumbosacral region: Secondary | ICD-10-CM | POA: Diagnosis not present

## 2014-01-12 DIAGNOSIS — F4329 Adjustment disorder with other symptoms: Secondary | ICD-10-CM | POA: Diagnosis not present

## 2014-01-13 DIAGNOSIS — M5137 Other intervertebral disc degeneration, lumbosacral region: Secondary | ICD-10-CM | POA: Diagnosis not present

## 2014-01-17 DIAGNOSIS — M5137 Other intervertebral disc degeneration, lumbosacral region: Secondary | ICD-10-CM | POA: Diagnosis not present

## 2014-01-20 DIAGNOSIS — M5137 Other intervertebral disc degeneration, lumbosacral region: Secondary | ICD-10-CM | POA: Diagnosis not present

## 2014-01-21 DIAGNOSIS — M48061 Spinal stenosis, lumbar region without neurogenic claudication: Secondary | ICD-10-CM | POA: Diagnosis not present

## 2014-01-21 DIAGNOSIS — Z5189 Encounter for other specified aftercare: Secondary | ICD-10-CM | POA: Diagnosis not present

## 2014-01-21 DIAGNOSIS — M5137 Other intervertebral disc degeneration, lumbosacral region: Secondary | ICD-10-CM | POA: Diagnosis not present

## 2014-01-24 DIAGNOSIS — M5137 Other intervertebral disc degeneration, lumbosacral region: Secondary | ICD-10-CM | POA: Diagnosis not present

## 2014-01-27 DIAGNOSIS — M5137 Other intervertebral disc degeneration, lumbosacral region: Secondary | ICD-10-CM | POA: Diagnosis not present

## 2014-01-31 DIAGNOSIS — M5137 Other intervertebral disc degeneration, lumbosacral region: Secondary | ICD-10-CM | POA: Diagnosis not present

## 2014-02-02 ENCOUNTER — Encounter: Payer: Self-pay | Admitting: Cardiology

## 2014-02-02 ENCOUNTER — Other Ambulatory Visit: Payer: Self-pay

## 2014-02-02 DIAGNOSIS — E7800 Pure hypercholesterolemia, unspecified: Secondary | ICD-10-CM

## 2014-02-02 DIAGNOSIS — F4329 Adjustment disorder with other symptoms: Secondary | ICD-10-CM | POA: Diagnosis not present

## 2014-02-02 MED ORDER — SIMVASTATIN 20 MG PO TABS: 20.0000 mg | ORAL_TABLET | Freq: Every day | ORAL | Status: DC

## 2014-02-03 DIAGNOSIS — M5137 Other intervertebral disc degeneration, lumbosacral region: Secondary | ICD-10-CM | POA: Diagnosis not present

## 2014-03-04 DIAGNOSIS — H353290 Exudative age-related macular degeneration, unspecified eye, stage unspecified: Secondary | ICD-10-CM | POA: Diagnosis not present

## 2014-03-04 DIAGNOSIS — H35059 Retinal neovascularization, unspecified, unspecified eye: Secondary | ICD-10-CM | POA: Diagnosis not present

## 2014-03-09 DIAGNOSIS — F4329 Adjustment disorder with other symptoms: Secondary | ICD-10-CM | POA: Diagnosis not present

## 2014-03-21 DIAGNOSIS — Z23 Encounter for immunization: Secondary | ICD-10-CM | POA: Diagnosis not present

## 2014-04-13 DIAGNOSIS — F4329 Adjustment disorder with other symptoms: Secondary | ICD-10-CM | POA: Diagnosis not present

## 2014-05-18 ENCOUNTER — Other Ambulatory Visit (INDEPENDENT_AMBULATORY_CARE_PROVIDER_SITE_OTHER): Payer: Medicare Other | Admitting: *Deleted

## 2014-05-18 ENCOUNTER — Ambulatory Visit (INDEPENDENT_AMBULATORY_CARE_PROVIDER_SITE_OTHER): Payer: Medicare Other | Admitting: Cardiology

## 2014-05-18 VITALS — BP 158/72 | HR 56 | Ht 63.0 in | Wt 149.0 lb

## 2014-05-18 DIAGNOSIS — R9431 Abnormal electrocardiogram [ECG] [EKG]: Secondary | ICD-10-CM | POA: Diagnosis not present

## 2014-05-18 DIAGNOSIS — E785 Hyperlipidemia, unspecified: Secondary | ICD-10-CM

## 2014-05-18 DIAGNOSIS — I471 Supraventricular tachycardia: Secondary | ICD-10-CM | POA: Diagnosis not present

## 2014-05-18 DIAGNOSIS — R55 Syncope and collapse: Secondary | ICD-10-CM | POA: Diagnosis not present

## 2014-05-18 DIAGNOSIS — I119 Hypertensive heart disease without heart failure: Secondary | ICD-10-CM

## 2014-05-18 LAB — BASIC METABOLIC PANEL
BUN: 16 mg/dL (ref 6–23)
CO2: 27 mEq/L (ref 19–32)
CREATININE: 0.8 mg/dL (ref 0.4–1.2)
Calcium: 8.9 mg/dL (ref 8.4–10.5)
Chloride: 99 mEq/L (ref 96–112)
GFR: 76.65 mL/min (ref >60.00)
GLUCOSE: 105 mg/dL — AB (ref 70–99)
Potassium: 4.2 mEq/L (ref 3.5–5.1)
Sodium: 132 mEq/L — ABNORMAL LOW (ref 135–145)

## 2014-05-18 LAB — LIPID PANEL
Cholesterol: 177 mg/dL (ref 0–200)
HDL: 54.6 mg/dL (ref >39.00)
LDL CALC: 96 mg/dL (ref 0–99)
NonHDL: 122.4
TRIGLYCERIDES: 133 mg/dL (ref 0.0–149.0)
Total CHOL/HDL Ratio: 3
VLDL: 26.6 mg/dL (ref 0.0–40.0)

## 2014-05-18 LAB — HEPATIC FUNCTION PANEL
ALK PHOS: 53 U/L (ref 39–117)
ALT: 19 U/L (ref 0–35)
AST: 20 U/L (ref 0–37)
Albumin: 4 g/dL (ref 3.5–5.2)
BILIRUBIN DIRECT: 0 mg/dL (ref 0.0–0.3)
TOTAL PROTEIN: 6.7 g/dL (ref 6.0–8.3)
Total Bilirubin: 0.8 mg/dL (ref 0.2–1.2)

## 2014-05-18 NOTE — Assessment & Plan Note (Signed)
2 of her episodes of tachycardia for associated with subsequent syncope.  We will consider event recorder if episodes persist.  Continue current medication.

## 2014-05-18 NOTE — Progress Notes (Signed)
Quick Note:  Please report to patient. The recent labs are stable. Continue same medication and careful diet. ______ 

## 2014-05-18 NOTE — Patient Instructions (Signed)
Will obtain labs today and call you with the results (lp/bmet/hfp)  Your physician recommends that you continue on your current medications as directed. Please refer to the Current Medication list given to you today.  Your physician has requested that you have a lexiscan myoview. For further information please visit [email or web address]. Please follow instruction sheet, as given.

## 2014-05-18 NOTE — Assessment & Plan Note (Signed)
Patient has a history of hyperlipidemia.  She is not having any problems from her simvastatin terms of myalgias etc.

## 2014-05-18 NOTE — Assessment & Plan Note (Signed)
She is having the episodes of tachycardia more frequently.  2 of them were associated with exertion.  We will evaluate her again for myocardial ischemia with a Lexiscan Myoview

## 2014-05-18 NOTE — Progress Notes (Signed)
Alexandra Reyes Date of Birth:  [birth year] [facility] [address] [place], [place]  [address] [phone]        Fax   [phone]   History of Present Illness: This pleasant 80 year old widowed woman is seen for a scheduled 6 month followup office visit. She has a past history of diastolic dysfunction with dyspnea and a history of paroxysmal supraventricular tachycardia. She does not have any history of ischemic heart disease. She had a normal treadmill Cardiolite stress test in 2010. She has not had cardiac catheterization. Echocardiogram in 2004 showed diastolic dysfunction and trace mitral regurgitation. She has a history of significant macular degeneration and is followed by Dr. Allyne Reyes. She has been having more shortness of breath recently. She had a chest x-ray on 04/28/13 which showed hyperinflation. She has had pulmonary function studies through Dr. Raphael Reyes's office.  Initially she was felt to have possible COPD.  Subsequently she was told that she did not have COPD after she had her pulmonary function studies.  She uses albuterol infrequently.  She is taking Spiriva every 48 hours instead of every day. Her last echocardiogram on 06/16/13 showed normal left ventricular systolic function with an ejection fraction of 60-65% and there was mild diastolic dysfunction and there were no valve abnormalities.  Since we last saw her 6 months ago, she had episodes of SVT on August 30, November 6, and several weeks ago.  Current Outpatient Prescriptions  Medication Sig Dispense Refill  . ALPRAZolam (XANAX) 0.25 MG tablet TAKE ONE TABLET BY MOUTH DAILY AS NEEDED  30 tablet 5  . amLODipine (NORVASC) 2.5 MG tablet TAKE ONE TABLET BY MOUTH ONE TIME DAILY  90 tablet 1  . Ascorbic Acid (VITAMIN C PO) Take by mouth.      Marland Kitchen. aspirin 81 MG tablet Take 81 mg by mouth daily.      Marland Kitchen. atenolol (TENORMIN) 25 MG tablet Take one tablet by mouth twice daily 100 tablet PRN  . B Complex Vitamins  (VITAMIN B COMPLEX PO) Take by mouth daily.      . calcium carbonate (CALCIUM 600) 600 MG TABS Take 600 mg by mouth daily.      . digoxin (LANOXIN) 0.125 MG tablet Take 1 tablet (0.125 mg total) by mouth daily. 14 tablet 0  . ferrous sulfate 325 (65 FE) MG tablet Take 325 mg by mouth daily.     . furosemide (LASIX) 40 MG tablet Take 1 tablet (40 mg total) by mouth daily. 90 tablet 3  . Glucosamine HCl-MSM (GLUCOSAMINE-MSM PO) Take 3 tablets by mouth daily.    Marland Kitchen. ibuprofen (ADVIL,MOTRIN) 200 MG tablet Take 200 mg by mouth every 6 (six) hours as needed.      . simvastatin (ZOCOR) 20 MG tablet Take 1 tablet (20 mg total) by mouth at bedtime. 90 tablet 3  . tiotropium (SPIRIVA HANDIHALER) 18 MCG inhalation capsule Place 1 capsule (18 mcg total) into inhaler and inhale daily as needed. 30 capsule 12  . triamcinolone cream (KENALOG) 0.1 % Apply 1 application topically 2 (two) times daily. 30 g 0  . vitamin E 200 UNIT capsule Take 200 Units by mouth daily.     No current facility-administered medications for this visit.    Allergies  Allergen Reactions  . Lisinopril   . Pneumovax 23 [Pneumococcal Vac Polyvalent]     Flu like symtpoms for 1 day    Patient Active Problem List   Diagnosis Date Noted  .  Abnormal EKG 05/18/2014  . Syncope 05/18/2014  . Dyslipidemia 05/14/2013  . Dyspnea on exertion 05/14/2013  . DOE (dyspnea on exertion) 04/28/2013  . Personal history of colonic polyps 10/28/2012  . Osteopenia 04/29/2012  . Elevated digoxin level 04/29/2012  . Allergic rhinitis, cause unspecified 05/22/2011  . Alcohol dependence in remission 05/22/2011  . Depression 05/22/2011  . Preventative health care 05/18/2011  . HTN (hypertension) 05/18/2011  . Hyperlipidemia 05/18/2011  . Anemia, iron deficiency 05/18/2011  . GERD (gastroesophageal reflux disease) 05/18/2011  . DDD (degenerative disc disease), lumbar 05/18/2011  . Pectus excavatum 05/18/2011  . Macular degeneration   . OA  (osteoarthritis)   . Cholelithiasis   . COPD (chronic obstructive pulmonary disease)   . Paroxysmal SVT (supraventricular tachycardia) 02/07/2011    History  Smoking status  . Former Smoker  . Quit date: 01/24/2003  Smokeless tobacco  . Not on file    History  Alcohol Use No    Family History  Problem Relation Age of Onset  . Heart failure Mother   . Arthritis Mother   . Kidney failure Father   . Heart disease Father   . Hypertension Father     Review of Systems: Constitutional: no fever chills diaphoresis or fatigue or change in weight.  Head and neck: no hearing loss, no epistaxis, no photophobia or visual disturbance. Respiratory: No cough, shortness of breath or wheezing. Cardiovascular: No chest pain peripheral edema, palpitations. Gastrointestinal: No abdominal distention, no abdominal pain, no change in bowel habits hematochezia or melena. Genitourinary: No dysuria, no frequency, no urgency, no nocturia. Musculoskeletal:No arthralgias, no back pain, no gait disturbance or myalgias. Neurological: No dizziness, no headaches, no numbness, no seizures, no syncope, no weakness, no tremors. Hematologic: No lymphadenopathy, no easy bruising. Psychiatric: No confusion, no hallucinations, no sleep disturbance.    Physical Exam: Filed Vitals:   05/18/14 0912  BP: 158/72  Pulse: 56   General appearance reveals a well-developed well-nourished woman in no distress.The head and neck exam reveals pupils equal and reactive.  Extraocular movements are full.  There is no scleral icterus.  The mouth and pharynx are normal.  The neck is supple.  The carotids reveal no bruits.  The jugular venous pressure is normal.  The  thyroid is not enlarged.  There is no lymphadenopathy.  The chest is clear to percussion and auscultation.  There are no rales or rhonchi.  Expansion of the chest is symmetrical.  The precordium is quiet.  The first heart sound is normal.  The second heart sound is  physiologically split.  There is soft grade 1/6 systolic ejection murmur at the base. There is no abnormal lift or heave.  The abdomen is soft and nontender.  The bowel sounds are normal.  The liver and spleen are not enlarged.  There are no abdominal masses.  There are no abdominal bruits.  Extremities reveal good pedal pulses.  There is no phlebitis or edema.  There is no cyanosis or clubbing.  Strength is normal and symmetrical in all extremities.  There is no lateralizing weakness.  There are no sensory deficits.  The skin is warm and dry.  There is no rash.  EKG shows normal sinus rhythm and inferior wall Q waves and anteroseptal Q waves unchanged from prior tracings.  These reflect her pectus excavatum.  EKG is unchanged since 12/08/13  Assessment / Plan: 1. essential hypertension 2. history of paroxysmal supraventricular tachycardia, recently more frequent and more prolonged. 3. pectus excavatum 4.  Hyperlipidemia 5. dyspnea on exertion.  Echocardiogram shows normal systolic function and grade 1 diastolic dysfunction. 6.  Aortic valve sclerosis  Plan: Continue on same medication.  Recheck in 6 months for office visit lipid panel, hepatic function panel, and basal metabolic panel. We will have her return for a Lexiscan Myoview to evaluate her recent episodes of chest and neck tightness associated with episodes of rapid heart action.  Consider event monitor if episodes of tachycardia persists.  She may be having post tachycardia sinus pauses to account for further episodes of syncope.

## 2014-05-23 ENCOUNTER — Other Ambulatory Visit: Payer: Self-pay | Admitting: Cardiology

## 2014-05-25 DIAGNOSIS — F4329 Adjustment disorder with other symptoms: Secondary | ICD-10-CM | POA: Diagnosis not present

## 2014-05-27 DIAGNOSIS — H43813 Vitreous degeneration, bilateral: Secondary | ICD-10-CM | POA: Diagnosis not present

## 2014-05-27 DIAGNOSIS — H3532 Exudative age-related macular degeneration: Secondary | ICD-10-CM | POA: Diagnosis not present

## 2014-06-01 ENCOUNTER — Ambulatory Visit (HOSPITAL_COMMUNITY): Payer: Medicare Other | Attending: Cardiovascular Disease | Admitting: Radiology

## 2014-06-01 DIAGNOSIS — R0609 Other forms of dyspnea: Secondary | ICD-10-CM | POA: Diagnosis not present

## 2014-06-01 DIAGNOSIS — R0602 Shortness of breath: Secondary | ICD-10-CM | POA: Insufficient documentation

## 2014-06-01 DIAGNOSIS — I10 Essential (primary) hypertension: Secondary | ICD-10-CM | POA: Insufficient documentation

## 2014-06-01 DIAGNOSIS — I471 Supraventricular tachycardia: Secondary | ICD-10-CM | POA: Diagnosis not present

## 2014-06-01 DIAGNOSIS — R55 Syncope and collapse: Secondary | ICD-10-CM | POA: Diagnosis not present

## 2014-06-01 DIAGNOSIS — R9431 Abnormal electrocardiogram [ECG] [EKG]: Secondary | ICD-10-CM | POA: Diagnosis not present

## 2014-06-01 MED ORDER — REGADENOSON 0.4 MG/5ML IV SOLN
0.4000 mg | Freq: Once | INTRAVENOUS | Status: AC
  Administered 2014-06-01: 0.4 mg via INTRAVENOUS

## 2014-06-01 MED ORDER — TECHNETIUM TC 99M SESTAMIBI GENERIC - CARDIOLITE
11.0000 | Freq: Once | INTRAVENOUS | Status: AC | PRN
  Administered 2014-06-01: 11 via INTRAVENOUS

## 2014-06-01 MED ORDER — TECHNETIUM TC 99M SESTAMIBI GENERIC - CARDIOLITE
33.0000 | Freq: Once | INTRAVENOUS | Status: AC | PRN
  Administered 2014-06-01: 33 via INTRAVENOUS

## 2014-06-01 NOTE — Progress Notes (Signed)
[facility] SITE 3 NUCLEAR MED [address] [place], [place] [address] [phone]    Cardiology Nuclear Med Study  [name] is a 80 y.o. female     MRN : [id number]     DOB: [birth year]  Procedure Date: 06/01/2014  Nuclear Med Background Indication for Stress Test:  Evaluation for Ischemia and Abnormal EKG History:  CAD; 2010 MPI-NL, EF 84% Cardiac Risk Factors: Hypertension  Symptoms:  DOE and SOB   Nuclear Pre-Procedure Caffeine/Decaff Intake:  None NPO After: 9:00pm   Lungs:  clear O2 Sat: 97% on room air. IV 0.9% NS with Angio Cath:  22g  IV Site: R Forearm  IV Started by:  Cathlyn Parsonsynthia Hasspacher, RN  Chest Size (in):  38 Cup Size: B  Height: 5\' 3"  (1.6 m)  Weight:  149 lb (67.586 kg)  BMI:  Body mass index is 26.4 kg/(m^2). Tech Comments:  Held atenolol 12 hrs    Nuclear Med Study 1 or 2 day study: 1 day  Stress Test Type:  Lexiscan  Reading MD: Olga MillersBrian Crenshaw, MD  Order Authorizing Provider:  Wylene Simmer. Brackbill, MD  Resting Radionuclide: Technetium 1220m Sestamibi  Resting Radionuclide Dose: 11.0 mCi   Stress Radionuclide:  Technetium 3020m Sestamibi  Stress Radionuclide Dose: 33.0 mCi           Stress Protocol Rest HR: 54 Stress HR: 80  Rest BP: 149/58 Stress BP: 148/65  Exercise Time (min): n/a METS: n/a   Predicted Max HR: 134 bpm % Max HR: 59.7 bpm Rate Pressure Product: [id number]   Dose of Adenosine (mg):  n/a Dose of Lexiscan: 0.4 mg  Dose of Atropine (mg): n/a Dose of Dobutamine: n/a mcg/kg/min (at max HR)  Stress Test Technologist: Milana NaSabrina Williams, EMT-P  Nuclear Technologist:  Kerby NoraElzbieta Kubak, CNMT     Rest Procedure:  Myocardial perfusion imaging was performed at rest 45 minutes following the intravenous administration of Technetium 3520m Sestamibi. Rest ECG: Sinus bradycardia, PVC, first degree AV block, anterior and inferior MI.  Stress Procedure:  The patient received IV Lexiscan 0.4 mg over 15-seconds.  Technetium 5620m Sestamibi injected at  30-seconds. This patient was sob, lt. Headed, and dizzy with the Lexiscan injection. Quantitative spect images were obtained after a 45 minute delay. Stress ECG: No significant ST segment change suggestive of ischemia.  QPS Raw Data Images:  Acquisition technically good; normal left ventricular size. Stress Images:  Normal homogeneous uptake in all areas of the myocardium. Rest Images:  Normal homogeneous uptake in all areas of the myocardium. Subtraction (SDS):  No evidence of ischemia. Transient Ischemic Dilatation (Normal <1.22):  0.92 Lung/Heart Ratio (Normal <0.45):  0.37  Quantitative Gated Spect Images QGS EDV:  71 ml QGS ESV:  16 ml  Impression Exercise Capacity:  Lexiscan with no exercise. BP Response:  Normal blood pressure response. Clinical Symptoms:  There is dyspnea. ECG Impression:  No significant ST segment change suggestive of ischemia. Comparison with Prior Nuclear Study: Compared to 03/08/09, no significant change.   Overall Impression:  Normal stress nuclear study.  LV Ejection Fraction: 78%.  LV Wall Motion:  NL LV Function; NL Wall Motion  Olga MillersBrian Crenshaw

## 2014-06-06 ENCOUNTER — Telehealth: Payer: Self-pay | Admitting: *Deleted

## 2014-06-06 MED ORDER — FUROSEMIDE 40 MG PO TABS
40.0000 mg | ORAL_TABLET | Freq: Two times a day (BID) | ORAL | Status: DC
Start: 2014-06-06 — End: 2014-06-13

## 2014-06-06 NOTE — Telephone Encounter (Signed)
-----   Message from Cassell Clementhomas Brackbill, MD sent at 06/05/2014  3:28 PM EST ----- Please report.  Nuclear stress test was normal.  No ischemia. Normal EF.

## 2014-06-06 NOTE — Telephone Encounter (Signed)
Advised patient, verbalized understanding  

## 2014-06-06 NOTE — Telephone Encounter (Signed)
Try increasing furosemide to 40 mg twice a day.  Some of her dyspnea may be secondary to her mild diastolic dysfunction seen on echo.  If her breathing does not improve with higher dose diuretic, we will consider referral to pulmonary.

## 2014-06-06 NOTE — Telephone Encounter (Signed)
Advised patient of results Patient continues to have shortness of breath with little or no exertion and is concerned  Will forward to  Dr. [name] for review

## 2014-06-13 ENCOUNTER — Telehealth: Payer: Self-pay | Admitting: Cardiology

## 2014-06-13 ENCOUNTER — Other Ambulatory Visit: Payer: Self-pay

## 2014-06-13 ENCOUNTER — Other Ambulatory Visit: Payer: Self-pay | Admitting: Internal Medicine

## 2014-06-13 MED ORDER — TIOTROPIUM BROMIDE MONOHYDRATE 18 MCG IN CAPS: ORAL_CAPSULE | RESPIRATORY_TRACT | Status: DC

## 2014-06-13 MED ORDER — FUROSEMIDE 40 MG PO TABS: ORAL_TABLET | ORAL | Status: DC

## 2014-06-13 NOTE — Telephone Encounter (Signed)
New message      Pt c/o medication issue: 1. Name of Medication: lasix 2. How are you currently taking this medication (dosage and times per day)?  bid.   Took it for 4 days straight 3. Are you having a reaction (difficulty breathing--STAT)?  Not now 4. What is your medication issue? After 4 days of  bid-----pt became lightheaded, week, sob and did not feel good.  bp was 110/64.  Yesterday pt took  once daily and felt fine.  Can she take the 2 pill every other day at 4pm?

## 2014-06-13 NOTE — Telephone Encounter (Signed)
Called patient back about her Lasix. Patient started taking 40 mg twice daily on Monday for four days and stop taking it due to feeling lightheaded. Patient kept taking her morning dose, and then started taking the second dose every other day. Patient states " I feel much better when I take it every other day."  Consulted Nada Boozer NP (Flex), she advised to stop second dose today and tomorrow, then start every other day. Advised patient of this plan and to call if symptoms come back. Patient was agreeable to this plan and verbalized understanding.

## 2014-06-14 DIAGNOSIS — H3532 Exudative age-related macular degeneration: Secondary | ICD-10-CM | POA: Diagnosis not present

## 2014-06-22 DIAGNOSIS — F4329 Adjustment disorder with other symptoms: Secondary | ICD-10-CM | POA: Diagnosis not present

## 2014-06-27 ENCOUNTER — Other Ambulatory Visit: Payer: Self-pay | Admitting: Cardiology

## 2014-07-13 ENCOUNTER — Ambulatory Visit (INDEPENDENT_AMBULATORY_CARE_PROVIDER_SITE_OTHER): Payer: Medicare Other | Admitting: Internal Medicine

## 2014-07-13 ENCOUNTER — Encounter: Payer: Self-pay | Admitting: Internal Medicine

## 2014-07-13 VITALS — BP 120/76 | HR 63 | Temp 98.4°F | Ht 63.0 in | Wt 154.0 lb

## 2014-07-13 DIAGNOSIS — F329 Major depressive disorder, single episode, unspecified: Secondary | ICD-10-CM

## 2014-07-13 DIAGNOSIS — E785 Hyperlipidemia, unspecified: Secondary | ICD-10-CM | POA: Diagnosis not present

## 2014-07-13 DIAGNOSIS — J449 Chronic obstructive pulmonary disease, unspecified: Secondary | ICD-10-CM | POA: Diagnosis not present

## 2014-07-13 DIAGNOSIS — I10 Essential (primary) hypertension: Secondary | ICD-10-CM | POA: Diagnosis not present

## 2014-07-13 DIAGNOSIS — F32A Depression, unspecified: Secondary | ICD-10-CM

## 2014-07-13 NOTE — Assessment & Plan Note (Signed)
stable overall by history and exam, recent data reviewed with pt, and pt to continue medical treatment as before,  to f/u any worsening symptoms or concerns Lab Results  Component Value Date   LDLCALC 96 05/18/2014

## 2014-07-13 NOTE — Assessment & Plan Note (Signed)
stable overall by history and exam, recent data reviewed with pt, and pt to continue medical treatment as before,  to f/u any worsening symptoms or concerns / BP Readings from Last 3 Encounters:  07/13/14 120/76  06/01/14 149/58  05/18/14 158/72

## 2014-07-13 NOTE — Progress Notes (Signed)
Subjective:    Patient ID: Alexandra Reyes, female    DOB: September 06, 1925, 81 y.o.   MRN: [id number]  HPI  Here to f/u; overall doing ok,  Pt denies chest pain, increased sob or doe, wheezing, orthopnea, PND, increased LE swelling, palpitations, dizziness or syncope.  Pt denies polydipsia, polyuria, or low sugar symptoms such as weakness or confusion improved with po intake.  Pt denies new neurological symptoms such as new headache, or facial or extremity weakness or numbness.   Pt states overall good compliance with meds, has been trying to follow lower cholesterol  diet, with wt overall stable.  Now taking lasix 40 qam, with 20 at 4 pm qod. Also To see ortho tomorrow for left sided recurrent pain she thinks related to her previously torn prox left hamstring.  Beter previously with PT, did not keep up the excercises, until recent re-start but not helping so far. Pt continues to have recurring LBP /ischial tuberosity pain without change in severity, bowel or bladder change, fever, wt loss,  worsening LE pain/numbness/weakness, gait change or falls. Denies worsening depressive symptoms, suicidal ideation, or panic Past Medical History  Diagnosis Date  . PAT (paroxysmal atrial tachycardia)   . DOE (dyspnea on exertion)   . Chest pressure   . Macular degeneration   . OA (osteoarthritis)   . Hiatal hernia   . GERD (gastroesophageal reflux disease)   . Cholelithiasis   . Dyspnea   . COPD (chronic obstructive pulmonary disease)   . HTN (hypertension) 05/18/2011  . Hyperlipidemia 05/18/2011  . Osteoporosis 05/18/2011  . Anemia, iron deficiency 05/18/2011  . DDD (degenerative disc disease), lumbar 05/18/2011  . Pectus excavatum 05/18/2011  . Allergy   . Allergic rhinitis, cause unspecified 05/22/2011  . Alcohol dependence in remission 05/22/2011  . Personal history of colonic polyps 10/28/2012   Past Surgical History  Procedure Laterality Date  . Tonsillectomy and adenoidectomy    . Breast lumpectomy     . US echocardiography  01/17/2003    EF 55-60%  . Cardiovascular stress test  03/08/2009    EF 84%  . Abdominal hysterectomy  1988  . Breast biopsy  1948    reports that she quit smoking about 11 years ago. She does not have any smokeless tobacco history on file. She reports that she does not drink alcohol or use illicit drugs. family history includes Arthritis in her mother; Heart disease in her father; Heart failure in her mother; Hypertension in her father; Kidney failure in her father. Allergies  Allergen Reactions  . Lisinopril   . Pneumovax 23 [Pneumococcal Vac Polyvalent]     Flu like symtpoms for 1 day   Current Outpatient Prescriptions on File Prior to Visit  Medication Sig Dispense Refill  . ALPRAZolam (XANAX) 0.25 MG tablet TAKE ONE TABLET BY MOUTH DAILY AS NEEDED  30 tablet 5  . amLODipine (NORVASC) 2.5 MG tablet TAKE ONE TABLET BY MOUTH ONE TIME DAILY  90 tablet 1  . Ascorbic Acid (VITAMIN C PO) Take by mouth.      Marland Kitchen aspirin 81 MG tablet Take 81 mg by mouth daily.      Marland Kitchen atenolol (TENORMIN) 25 MG tablet Take one tablet by mouth twice daily 100 tablet PRN  . B Complex Vitamins (VITAMIN B COMPLEX PO) Take by mouth daily.      . calcium carbonate (CALCIUM 600) 600 MG TABS Take 600 mg by mouth daily.      . digoxin (LANOXIN) 0.125 MG  tablet Take 1 tablet (0.125 mg total) by mouth daily. (Patient taking differently: Take 0.125 mg by mouth 2 (two) times daily. ) 14 tablet 0  . ferrous sulfate 325 (65 FE) MG tablet Take 325 mg by mouth daily.     . furosemide (LASIX) 40 MG tablet Take 40 mg daily, then every other day take twice daily 180 tablet 3  . Glucosamine HCl-MSM (GLUCOSAMINE-MSM PO) Take 3 tablets by mouth daily.    Marland Kitchen. ibuprofen (ADVIL,MOTRIN) 200 MG tablet Take 200 mg by mouth every 6 (six) hours as needed.      . simvastatin (ZOCOR) 20 MG tablet Take 1 tablet (20 mg total) by mouth at bedtime. 90 tablet 3  . tiotropium (SPIRIVA HANDIHALER) 18 MCG inhalation capsule Place  1 capsule (18 mcg total) into inhaler and inhale daily as needed. 30 capsule 12  . triamcinolone cream (KENALOG) 0.1 % Apply 1 application topically 2 (two) times daily. 30 g 0  . vitamin E 200 UNIT capsule Take 200 Units by mouth daily.     No current facility-administered medications on file prior to visit.    Review of Systems  Constitutional: Negative for unusual diaphoresis or other sweats  HENT: Negative for ringing in ear Eyes: Negative for double vision or worsening visual disturbance.  Respiratory: Negative for choking and stridor.   Gastrointestinal: Negative for vomiting or other signifcant bowel change Genitourinary: Negative for hematuria or decreased urine volume.  Musculoskeletal: Negative for other MSK pain or swelling Skin: Negative for color change and worsening wound.  Neurological: Negative for tremors and numbness other than noted  Psychiatric/Behavioral: Negative for decreased concentration or agitation other than above       Objective:   Physical Exam BP 120/76 mmHg  Pulse 63  Temp(Src) 98.4 F (36.9 C) (Oral)  Ht 5\' 3"  (1.6 m)  Wt 154 lb (69.854 kg)  BMI 27.29 kg/m2  SpO2 95% VS noted,  Constitutional: Pt appears well-developed, well-nourished.  HENT: Head: NCAT.  Right Ear: External ear normal.  Left Ear: External ear normal.  Eyes: . Pupils are equal, round, and reactive to light. Conjunctivae and EOM are normal Neck: Normal range of motion. Neck supple.  Cardiovascular: Normal rate and regular rhythm.   Pulmonary/Chest: Effort normal and breath sounds without rales or wheezing.  Neurological: Pt is alert. Not confused , motor grossly intact Skin: Skin is warm. No rash, has trace bilat ankle edema Psychiatric: Pt behavior is normal. No agitation.  not depressed affect    Assessment & Plan:

## 2014-07-13 NOTE — Patient Instructions (Signed)
Please continue all other medications as before, and refills have been done if requested.  Please have the pharmacy call with any other refills you may need.  Please continue your efforts at being more active, low cholesterol diet, and weight control.  Please keep your appointments with your specialists as you may have planned  Please return in 6 months, or sooner if needed 

## 2014-07-13 NOTE — Assessment & Plan Note (Signed)
stable overall by history and exam, recent data reviewed with pt, and pt to continue medical treatment as before,  to f/u any worsening symptoms or concerns SpO2 Readings from Last 3 Encounters:  07/13/14 95%  12/29/13 94%  06/30/13 95%

## 2014-07-13 NOTE — Assessment & Plan Note (Signed)
stable overall by history and exam, recent data reviewed with pt, and pt to continue medical treatment as before,  to f/u any worsening symptoms or concerns Lab Results  Component Value Date   WBC 6.6 05/14/2013   HGB 13.1 05/14/2013   HCT 39.5 05/14/2013   PLT 328.0 05/14/2013   GLUCOSE 105* 05/18/2014   CHOL 177 05/18/2014   TRIG 133.0 05/18/2014   HDL 54.60 05/18/2014   LDLDIRECT 129.2 05/15/2011   LDLCALC 96 05/18/2014   ALT 19 05/18/2014   AST 20 05/18/2014   NA 132* 05/18/2014   K 4.2 05/18/2014   CL 99 05/18/2014   CREATININE 0.8 05/18/2014   BUN 16 05/18/2014   CO2 27 05/18/2014   TSH 2.45 04/29/2012   INR 1.0 07/13/2007   HGBA1C  07/13/2007    5.8 (NOTE)   The ADA recommends the following therapeutic goals for glycemic   control related to Hgb A1C measurement:   Goal of Therapy:   < 7.0% Hgb A1C   Action Suggested:  > 8.0% Hgb A1C   Ref:  Diabetes Care, 22, Suppl. 1, 1999

## 2014-07-13 NOTE — Assessment & Plan Note (Signed)
>>  ASSESSMENT AND PLAN FOR HTN (HYPERTENSION) WRITTEN ON 07/13/2014  1:12 PM BY [name], [name], MD  stable overall by history and exam, recent data reviewed with pt, and pt to continue medical treatment as before,  to f/u any worsening symptoms or concerns / BP Readings from Last 3 Encounters:  07/13/14 120/76  06/01/14 149/58  05/18/14 158/72

## 2014-07-13 NOTE — Progress Notes (Signed)
Pre visit review using our clinic review tool, if applicable. No additional management support is needed unless otherwise documented below in the visit note. 

## 2014-07-14 DIAGNOSIS — S76312D Strain of muscle, fascia and tendon of the posterior muscle group at thigh level, left thigh, subsequent encounter: Secondary | ICD-10-CM | POA: Diagnosis not present

## 2014-07-27 DIAGNOSIS — F4329 Adjustment disorder with other symptoms: Secondary | ICD-10-CM | POA: Diagnosis not present

## 2014-07-28 DIAGNOSIS — S76312D Strain of muscle, fascia and tendon of the posterior muscle group at thigh level, left thigh, subsequent encounter: Secondary | ICD-10-CM | POA: Diagnosis not present

## 2014-08-19 ENCOUNTER — Telehealth: Payer: Self-pay

## 2014-08-22 NOTE — Telephone Encounter (Signed)
call placed for status of flu; Patient states she did have flu vaccine at [facility] fall of 2015.  States she has not rec'd Digoxin .125 via mail order. Current supply through the weekend. will fup monday she doesn't receive in the mail.

## 2014-08-31 DIAGNOSIS — F4329 Adjustment disorder with other symptoms: Secondary | ICD-10-CM | POA: Diagnosis not present

## 2014-09-07 ENCOUNTER — Other Ambulatory Visit: Payer: Self-pay | Admitting: Cardiology

## 2014-09-07 DIAGNOSIS — F419 Anxiety disorder, unspecified: Secondary | ICD-10-CM

## 2014-09-16 DIAGNOSIS — H3532 Exudative age-related macular degeneration: Secondary | ICD-10-CM | POA: Diagnosis not present

## 2014-09-16 DIAGNOSIS — H43813 Vitreous degeneration, bilateral: Secondary | ICD-10-CM | POA: Diagnosis not present

## 2014-09-28 ENCOUNTER — Other Ambulatory Visit: Payer: Self-pay | Admitting: Cardiology

## 2014-09-29 MED ORDER — DIGOXIN 125 MCG PO TABS: 0.1250 mg | ORAL_TABLET | Freq: Every day | ORAL | Status: DC

## 2014-09-29 NOTE — Addendum Note (Signed)
Addended by: Regis BillPRATT, Kamani Magnussen B on: 09/29/2014 07:02 PM   Modules accepted: Orders

## 2014-10-05 DIAGNOSIS — F4329 Adjustment disorder with other symptoms: Secondary | ICD-10-CM | POA: Diagnosis not present

## 2014-10-12 DIAGNOSIS — H3532 Exudative age-related macular degeneration: Secondary | ICD-10-CM | POA: Diagnosis not present

## 2014-10-12 DIAGNOSIS — H43813 Vitreous degeneration, bilateral: Secondary | ICD-10-CM | POA: Diagnosis not present

## 2014-10-14 DIAGNOSIS — H3532 Exudative age-related macular degeneration: Secondary | ICD-10-CM | POA: Diagnosis not present

## 2014-10-18 ENCOUNTER — Other Ambulatory Visit: Payer: Self-pay | Admitting: *Deleted

## 2014-10-18 MED ORDER — DIGOXIN 125 MCG PO TABS: 0.1250 mg | ORAL_TABLET | Freq: Every day | ORAL | Status: DC

## 2014-10-19 ENCOUNTER — Other Ambulatory Visit: Payer: Self-pay | Admitting: *Deleted

## 2014-10-19 DIAGNOSIS — M4806 Spinal stenosis, lumbar region: Secondary | ICD-10-CM | POA: Diagnosis not present

## 2014-10-19 MED ORDER — DIGOXIN 125 MCG PO TABS: 0.1250 mg | ORAL_TABLET | Freq: Every day | ORAL | Status: DC

## 2014-10-19 NOTE — Telephone Encounter (Signed)
Rx phoned into pharmacy.

## 2014-11-03 DIAGNOSIS — M5136 Other intervertebral disc degeneration, lumbar region: Secondary | ICD-10-CM | POA: Diagnosis not present

## 2014-11-03 DIAGNOSIS — M4806 Spinal stenosis, lumbar region: Secondary | ICD-10-CM | POA: Diagnosis not present

## 2014-11-09 DIAGNOSIS — F4329 Adjustment disorder with other symptoms: Secondary | ICD-10-CM | POA: Diagnosis not present

## 2014-11-16 DIAGNOSIS — Z1231 Encounter for screening mammogram for malignant neoplasm of breast: Secondary | ICD-10-CM | POA: Diagnosis not present

## 2014-11-16 LAB — HM MAMMOGRAPHY: HM MAMMO: NEGATIVE

## 2014-12-05 ENCOUNTER — Other Ambulatory Visit (INDEPENDENT_AMBULATORY_CARE_PROVIDER_SITE_OTHER): Payer: Medicare Other | Admitting: *Deleted

## 2014-12-05 DIAGNOSIS — E785 Hyperlipidemia, unspecified: Secondary | ICD-10-CM | POA: Diagnosis not present

## 2014-12-05 LAB — BASIC METABOLIC PANEL
BUN: 11 mg/dL (ref 6–23)
CALCIUM: 9.3 mg/dL (ref 8.4–10.5)
CHLORIDE: 103 meq/L (ref 96–112)
CO2: 29 meq/L (ref 19–32)
Creatinine, Ser: 0.8 mg/dL (ref 0.40–1.20)
GFR: 72.15 mL/min (ref >60.00)
Glucose, Bld: 99 mg/dL (ref 70–99)
Potassium: 4.3 mEq/L (ref 3.5–5.1)
SODIUM: 136 meq/L (ref 135–145)

## 2014-12-05 LAB — LIPID PANEL
CHOLESTEROL: 158 mg/dL (ref 0–200)
HDL: 47.1 mg/dL (ref >39.00)
LDL Cholesterol: 86 mg/dL (ref 0–99)
NonHDL: 110.9
TRIGLYCERIDES: 123 mg/dL (ref 0.0–149.0)
Total CHOL/HDL Ratio: 3
VLDL: 24.6 mg/dL (ref 0.0–40.0)

## 2014-12-05 LAB — HEPATIC FUNCTION PANEL
ALT: 17 U/L (ref 0–35)
AST: 18 U/L (ref 0–37)
Albumin: 3.9 g/dL (ref 3.5–5.2)
Alkaline Phosphatase: 51 U/L (ref 39–117)
Bilirubin, Direct: 0 mg/dL (ref 0.0–0.3)
Total Bilirubin: 0.6 mg/dL (ref 0.2–1.2)
Total Protein: 6.5 g/dL (ref 6.0–8.3)

## 2014-12-05 NOTE — Progress Notes (Signed)
Quick Note:  Please make copy of labs for patient visit. ______ 

## 2014-12-07 ENCOUNTER — Ambulatory Visit: Payer: Medicare Other | Admitting: Cardiology

## 2014-12-07 ENCOUNTER — Ambulatory Visit (INDEPENDENT_AMBULATORY_CARE_PROVIDER_SITE_OTHER): Payer: Medicare Other | Admitting: Cardiology

## 2014-12-07 ENCOUNTER — Encounter: Payer: Self-pay | Admitting: Cardiology

## 2014-12-07 VITALS — BP 120/56 | HR 61 | Ht 63.5 in | Wt 154.2 lb

## 2014-12-07 DIAGNOSIS — I471 Supraventricular tachycardia: Secondary | ICD-10-CM

## 2014-12-07 DIAGNOSIS — E785 Hyperlipidemia, unspecified: Secondary | ICD-10-CM

## 2014-12-07 DIAGNOSIS — I119 Hypertensive heart disease without heart failure: Secondary | ICD-10-CM

## 2014-12-07 NOTE — Progress Notes (Signed)
Cardiology Office Note   Date:  12/07/2014   ID:  Alexandra Reyes, DOB 1926/04/06, MRN [id number]  PCP:  Oliver BarreJames [name], MD  Cardiologist: Cassell Clementhomas Dade Rodin MD  No chief complaint on file.     History of Present Illness: Alexandra Reyes is a 81 y.o. female who presents for a six-month visit.  This pleasant 81 year old widowed woman is seen for a scheduled 6 month followup office visit. She has a past history of diastolic dysfunction with dyspnea and a history of paroxysmal supraventricular tachycardia. She does not have any history of ischemic heart disease. She had a normal treadmill Cardiolite stress test in 2010.  She had another Myoview stress test on 06/02/14 which showed no evidence of ischemia and showed normal ejection fraction. She has not had cardiac catheterization. Echocardiogram in 2004 showed diastolic dysfunction and trace mitral regurgitation. She has a history of significant macular degeneration and is followed by [name]. She has been having more shortness of breath recently. She had a chest x-ray on 04/28/13 which showed hyperinflation. She has had pulmonary function studies through Dr. Raphael GibneyJohn's office. Initially she was felt to have possible COPD. Subsequently she was told that she did not have COPD after she had her pulmonary function studies. She uses albuterol infrequently. She is taking Spiriva every 48 hours instead of every day. Her last echocardiogram on 06/16/13 showed normal left ventricular systolic function with an ejection fraction of 60-65% and there was mild diastolic dysfunction and there were no valve abnormalities. She has occasional episodes of SVT.  Last week she had what an episode which lasted about 4 hours.  She took Xanax and then digoxin and atenolol and tried Valsalva maneuver and it finally broke.  Past Medical History  Diagnosis Date  . PAT (paroxysmal atrial tachycardia)   . DOE (dyspnea on exertion)   . Chest pressure   . Macular  degeneration   . OA (osteoarthritis)   . Hiatal hernia   . GERD (gastroesophageal reflux disease)   . Cholelithiasis   . Dyspnea   . COPD (chronic obstructive pulmonary disease)   . HTN (hypertension) 05/18/2011  . Hyperlipidemia 05/18/2011  . Osteoporosis 05/18/2011  . Anemia, iron deficiency 05/18/2011  . DDD (degenerative disc disease), lumbar 05/18/2011  . Pectus excavatum 05/18/2011  . Allergy   . Allergic rhinitis, cause unspecified 05/22/2011  . Alcohol dependence in remission 05/22/2011  . Personal history of colonic polyps 10/28/2012    Past Surgical History  Procedure Laterality Date  . Tonsillectomy and adenoidectomy    . Breast lumpectomy    . [place] echocardiography  01/17/2003    EF 55-60%  . Cardiovascular stress test  03/08/2009    EF 84%  . Abdominal hysterectomy  1988  . Breast biopsy  1948     Current Outpatient Prescriptions  Medication Sig Dispense Refill  . ALPRAZolam (XANAX) 0.25 MG tablet Take 0.25 mg by mouth daily as needed for anxiety (tachycardia).    Marland Kitchen. amLODipine (NORVASC) 2.5 MG tablet TAKE ONE TABLET BY MOUTH ONE TIME DAILY  90 tablet 1  . Ascorbic Acid (VITAMIN C PO) Take 1 tablet by mouth daily.     Marland Kitchen. aspirin 81 MG tablet Take 81 mg by mouth daily.      Marland Kitchen. atenolol (TENORMIN) 25 MG tablet Take one tablet by mouth twice daily 100 tablet PRN  . B Complex Vitamins (VITAMIN B COMPLEX PO) Take 1 tablet by mouth daily.     . calcium carbonate (  CALCIUM 600) 600 MG TABS Take 600 mg by mouth daily.      . digoxin (LANOXIN) 0.125 MG tablet Take 1 tablet (0.125 mg total) by mouth daily. 90 tablet 1  . ferrous sulfate 325 (65 FE) MG tablet Take 325 mg by mouth daily.     . furosemide (LASIX) 40 MG tablet Take 40 mg by mouth. Take 40 mg by mouth in the morning and 20mg  by mouth in the evening    . Glucosamine HCl-MSM (GLUCOSAMINE-MSM PO) Take 3 tablets by mouth daily.    Marland Kitchen ibuprofen (ADVIL,MOTRIN) 200 MG tablet Take 200 mg by mouth every 6 (six) hours as needed  (pain).     . simvastatin (ZOCOR) 20 MG tablet Take 1 tablet (20 mg total) by mouth at bedtime. 90 tablet 3  . tiotropium (SPIRIVA) 18 MCG inhalation capsule Place 18 mcg into inhaler and inhale every other day as needed (shortness of breath).    . triamcinolone cream (KENALOG) 0.1 % Apply 1 application topically 2 (two) times daily. 30 g 0  . vitamin E 200 UNIT capsule Take 200 Units by mouth daily.     No current facility-administered medications for this visit.    Allergies:   Lisinopril and Pneumovax 23    Social History:  The patient  reports that she quit smoking about 11 years ago. She does not have any smokeless tobacco history on file. She reports that she does not drink alcohol or use illicit drugs.   Family History:  The patient's family history includes Arthritis in her mother; Heart disease in her father; Heart failure in her mother; Hypertension in her father; Kidney failure in her father.    ROS:  Please see the history of present illness.   Otherwise, review of systems are positive for none.   All other systems are reviewed and negative.    PHYSICAL EXAM: VS:  BP 120/56 mmHg  Pulse 61  Ht 5' 3.5" (1.613 m)  Wt 154 lb 3.2 oz (69.945 kg)  BMI 26.88 kg/m2 , BMI Body mass index is 26.88 kg/(m^2). GEN: Well nourished, well developed, in no acute distress HEENT: normal Neck: no JVD, carotid bruits, or masses Cardiac: RRR; there is a grade 1/6 systolic ejection murmur at the base.  There is mild ankle edema. Respiratory:  clear to auscultation bilaterally, normal work of breathing GI: soft, nontender, nondistended, + BS MS: no deformity or atrophy Skin: warm and dry, no rash Neuro:  Strength and sensation are intact Psych: euthymic mood, full affect   EKG:  EKG is not ordered today.    Recent Labs: 12/05/2014: ALT 17; BUN 11; Creatinine, Ser 0.80; Potassium 4.3; Sodium 136    Lipid Panel    Component Value Date/Time   CHOL 158 12/05/2014 0925   TRIG 123.0  12/05/2014 0925   HDL 47.10 12/05/2014 0925   CHOLHDL 3 12/05/2014 0925   VLDL 24.6 12/05/2014 0925   LDLCALC 86 12/05/2014 0925   LDLDIRECT 129.2 05/15/2011 0925      Wt Readings from Last 3 Encounters:  12/07/14 154 lb 3.2 oz (69.945 kg)  07/13/14 154 lb (69.854 kg)  06/01/14 149 lb (67.586 kg)        ASSESSMENT AND PLAN:  1. essential hypertension 2. history of paroxysmal supraventricular tachycardia, recently more frequent and more prolonged. 3. pectus excavatum 4. Hyperlipidemia 5. dyspnea on exertion. Echocardiogram shows normal systolic function and grade 1 diastolic dysfunction. 6. Aortic valve sclerosis   Current medicines are reviewed  at length with the patient today.  The patient does not have concerns regarding medicines.  The following changes have been made:  no change  Labs/ tests ordered today include:  No orders of the defined types were placed in this encounter.     Continue current medication.  Recheck in 6 months for office visit EKG lipid panel hepatic function panel and basal metabolic panel  Signed, Cassell Clement MD 12/07/2014 6:52 PM    [facility] [address] [place], [place], [place]  [address] Phone: [phone]; Fax: [phone]

## 2014-12-07 NOTE — Patient Instructions (Signed)
Medication Instructions:  Your physician recommends that you continue on your current medications as directed. Please refer to the Current Medication list given to you today.  Labwork: none  Testing/Procedures: none  Follow-Up: Your physician wants you to follow-up in: 6 months with fasting labs (lp/bmet/hfp) and ekg  You will receive a reminder letter in the mail two months in advance. If you don't receive a letter, please call our office to schedule the follow-up appointment.   

## 2014-12-13 ENCOUNTER — Other Ambulatory Visit: Payer: Self-pay | Admitting: Cardiology

## 2014-12-14 DIAGNOSIS — F4329 Adjustment disorder with other symptoms: Secondary | ICD-10-CM | POA: Diagnosis not present

## 2014-12-20 ENCOUNTER — Other Ambulatory Visit: Payer: Self-pay

## 2014-12-20 MED ORDER — AMLODIPINE BESYLATE 2.5 MG PO TABS: 2.5000 mg | ORAL_TABLET | Freq: Every day | ORAL | Status: DC

## 2014-12-23 DIAGNOSIS — H43813 Vitreous degeneration, bilateral: Secondary | ICD-10-CM | POA: Diagnosis not present

## 2014-12-23 DIAGNOSIS — H3532 Exudative age-related macular degeneration: Secondary | ICD-10-CM | POA: Diagnosis not present

## 2014-12-26 DIAGNOSIS — Z961 Presence of intraocular lens: Secondary | ICD-10-CM | POA: Diagnosis not present

## 2014-12-26 DIAGNOSIS — H3531 Nonexudative age-related macular degeneration: Secondary | ICD-10-CM | POA: Diagnosis not present

## 2015-01-09 ENCOUNTER — Telehealth: Payer: Self-pay | Admitting: Cardiology

## 2015-01-09 ENCOUNTER — Other Ambulatory Visit: Payer: Self-pay | Admitting: Cardiology

## 2015-01-09 DIAGNOSIS — I471 Supraventricular tachycardia: Secondary | ICD-10-CM

## 2015-01-09 NOTE — Telephone Encounter (Signed)
New message      Pt has had tachycardia 3 times in the past week-----lasting 1-3 hrs per episode. Please advise.  Pt want [place] to know that she takes 2 digoxin tablets on mondays and 1 tablet all other days

## 2015-01-09 NOTE — Telephone Encounter (Signed)
Agree with EP referral. Also we should probably have her come in for an event monitor to try to document her tachyarrhythmia.

## 2015-01-09 NOTE — Telephone Encounter (Signed)
Pt states she has had 3 episodes of tachycardia in the last week, the longest episode lasting a little more than 2 hours last night.  Pt states heart rate during these episodes in the 150-180 range.   Pt states  these are similar to past episodes of tachycardia, she is concerned because they have become more frequent in the last week.  Pt states the tachycardia episodes are usually resolved with xanax, hot water, extra atenolol, extra digoxin.

## 2015-01-09 NOTE — Telephone Encounter (Signed)
I reviewed with Norma Fredrickson, NP--she recommended referral to EP. I discussed referral to EP with patient, she prefers to wait to for Dr Patty Sermons to review before scheduling EP appt.  Pt is aware Dr Patty Sermons is out of the office this week, she is Ok with waiting for him to review.  Pt advised I will forward to Dr Patty Sermons for review.

## 2015-01-10 NOTE — Telephone Encounter (Signed)
Spoke with pt and informed her that Dr. Patty Sermons agreed with the EP referral and wanted pt to have an event monitor. Informed pt that I would place order and have someone from check out call her to schedule these appts. Pt verbalized understanding and was in agreement with this plan.

## 2015-01-16 ENCOUNTER — Ambulatory Visit (INDEPENDENT_AMBULATORY_CARE_PROVIDER_SITE_OTHER): Payer: Medicare Other

## 2015-01-16 DIAGNOSIS — I471 Supraventricular tachycardia: Secondary | ICD-10-CM

## 2015-01-18 DIAGNOSIS — F4329 Adjustment disorder with other symptoms: Secondary | ICD-10-CM | POA: Diagnosis not present

## 2015-01-30 DIAGNOSIS — H3531 Nonexudative age-related macular degeneration: Secondary | ICD-10-CM | POA: Diagnosis not present

## 2015-01-30 DIAGNOSIS — Z961 Presence of intraocular lens: Secondary | ICD-10-CM | POA: Diagnosis not present

## 2015-02-01 ENCOUNTER — Encounter: Payer: Self-pay | Admitting: Internal Medicine

## 2015-02-01 ENCOUNTER — Ambulatory Visit (INDEPENDENT_AMBULATORY_CARE_PROVIDER_SITE_OTHER): Payer: Medicare Other | Admitting: Internal Medicine

## 2015-02-01 VITALS — BP 116/66 | HR 60 | Temp 97.5°F | Ht 64.0 in | Wt 153.0 lb

## 2015-02-01 DIAGNOSIS — M79604 Pain in right leg: Secondary | ICD-10-CM

## 2015-02-01 DIAGNOSIS — M79605 Pain in left leg: Secondary | ICD-10-CM

## 2015-02-01 DIAGNOSIS — I10 Essential (primary) hypertension: Secondary | ICD-10-CM

## 2015-02-01 DIAGNOSIS — E785 Hyperlipidemia, unspecified: Secondary | ICD-10-CM

## 2015-02-01 DIAGNOSIS — R609 Edema, unspecified: Secondary | ICD-10-CM

## 2015-02-01 DIAGNOSIS — I5189 Other ill-defined heart diseases: Secondary | ICD-10-CM | POA: Insufficient documentation

## 2015-02-01 DIAGNOSIS — M79671 Pain in right foot: Secondary | ICD-10-CM | POA: Insufficient documentation

## 2015-02-01 NOTE — Progress Notes (Signed)
Subjective:    Patient ID: Alexandra Reyes, female    DOB: 1925/09/04, 81 y.o.   MRN: [id number]  HPI  Here to f/u; overall doing ok,  Pt denies chest pain, increasing sob or doe, wheezing, orthopnea, PND, increased LE swelling, palpitations, dizziness or syncope.  Pt denies new neurological symptoms such as new headache, or facial or extremity weakness or numbness.  Pt denies polydipsia, polyuria, or low sugar episode.   Pt denies new neurological symptoms such as new headache, or facial or extremity weakness or numbness.   Pt states overall good compliance with meds, mostly trying to follow appropriate diet, with wt overall stable,  but little exercise however. Did have recent SVT flare, now for cardiac monitor, seeing EP.  Has notice increased edema to ankle in past few wks, takes lasix regularly, does not want trial increase at this time.  Also with 2-3 mo intermittent bilat LE pain , worse to walk, better to sit and keep legs elevated, no hx of PVD. Past Medical History  Diagnosis Date  . PAT (paroxysmal atrial tachycardia)   . DOE (dyspnea on exertion)   . Chest pressure   . Macular degeneration   . OA (osteoarthritis)   . Hiatal hernia   . GERD (gastroesophageal reflux disease)   . Cholelithiasis   . Dyspnea   . COPD (chronic obstructive pulmonary disease)   . HTN (hypertension) 05/18/2011  . Hyperlipidemia 05/18/2011  . Osteoporosis 05/18/2011  . Anemia, iron deficiency 05/18/2011  . DDD (degenerative disc disease), lumbar 05/18/2011  . Pectus excavatum 05/18/2011  . Allergy   . Allergic rhinitis, cause unspecified 05/22/2011  . Alcohol dependence in remission 05/22/2011  . Personal history of colonic polyps 10/28/2012   Past Surgical History  Procedure Laterality Date  . Tonsillectomy and adenoidectomy    . Breast lumpectomy    . US echocardiography  01/17/2003    EF 55-60%  . Cardiovascular stress test  03/08/2009    EF 84%  . Abdominal hysterectomy  1988  . Breast biopsy   1948    reports that she quit smoking about 12 years ago. She does not have any smokeless tobacco history on file. She reports that she does not drink alcohol or use illicit drugs. family history includes Arthritis in her mother; Heart disease in her father; Heart failure in her mother; Hypertension in her father; Kidney failure in her father. Allergies  Allergen Reactions  . Lisinopril     Pt states she's never taken Lisinopril  . Pneumovax 23 [Pneumococcal Vac Polyvalent] Other (See Comments)    Flu like symtpoms for 1 day   Current Outpatient Prescriptions on File Prior to Visit  Medication Sig Dispense Refill  . ALPRAZolam (XANAX) 0.25 MG tablet Take 0.25 mg by mouth daily as needed for anxiety (tachycardia).    Marland Kitchen amLODipine (NORVASC) 2.5 MG tablet Take 1 tablet (2.5 mg total) by mouth daily. 90 tablet 1  . Ascorbic Acid (VITAMIN C PO) Take 1 tablet by mouth daily.     Marland Kitchen aspirin 81 MG tablet Take 81 mg by mouth daily.      Marland Kitchen atenolol (TENORMIN) 25 MG tablet TAKE ONE TABLET BY MOUTH TWICE DAILY 100 tablet PRN  . B Complex Vitamins (VITAMIN B COMPLEX PO) Take 1 tablet by mouth daily.     . calcium carbonate (CALCIUM 600) 600 MG TABS Take 600 mg by mouth daily.      . digoxin (LANOXIN) 0.125 MG tablet Take 1 tablet (  0.125 mg total) by mouth daily. 90 tablet 1  . ferrous sulfate 325 (65 FE) MG tablet Take 325 mg by mouth daily.     . furosemide (LASIX) 40 MG tablet Take 40 mg by mouth. Take 40 mg by mouth in the morning and 20mg  by mouth in the evening    . Glucosamine HCl-MSM (GLUCOSAMINE-MSM PO) Take 3 tablets by mouth daily.    Marland Kitchen ibuprofen (ADVIL,MOTRIN) 200 MG tablet Take 200 mg by mouth every 6 (six) hours as needed (pain).     . simvastatin (ZOCOR) 20 MG tablet TAKE 1 TABLET BY MOUTH DAILY AT BEDTIME (GENERIC FOR ZOCOR) 90 tablet 0  . tiotropium (SPIRIVA) 18 MCG inhalation capsule Place 18 mcg into inhaler and inhale every other day as needed (shortness of breath).    . triamcinolone  cream (KENALOG) 0.1 % Apply 1 application topically 2 (two) times daily. 30 g 0  . vitamin E 200 UNIT capsule Take 200 Units by mouth daily.     No current facility-administered medications on file prior to visit.    Review of Systems  Constitutional: Negative for unusual diaphoresis or night sweats HENT: Negative for ringing in ear or discharge Eyes: Negative for double vision or worsening visual disturbance.  Respiratory: Negative for choking and stridor.   Gastrointestinal: Negative for vomiting or other signifcant bowel change Genitourinary: Negative for hematuria or change in urine volume.  Musculoskeletal: Negative for other MSK pain or swelling Skin: Negative for color change and worsening wound.  Neurological: Negative for tremors and numbness other than noted  Psychiatric/Behavioral: Negative for decreased concentration or agitation other than above       Objective:   Physical Exam BP 116/66 mmHg  Pulse 60  Temp(Src) 97.5 F (36.4 C) (Oral)  Ht 5\' 4"  (1.626 m)  Wt 153 lb (69.4 kg)  BMI 26.25 kg/m2  SpO2 97% VS noted, not ill appearing Constitutional: Pt appears in no significant distress HENT: Head: NCAT.  Right Ear: External ear normal.  Left Ear: External ear normal.  Eyes: . Pupils are equal, round, and reactive to light. Conjunctivae and EOM are normal Neck: Normal range of motion. Neck supple.  Cardiovascular: Normal rate and regular rhythm.   Pulmonary/Chest: Effort normal and breath sounds without rales or wheezing.  Abd:  Soft, NT, ND, + BS Neurological: Pt is alert. Not confused , motor grossly intact Skin: Skin is warm. No rash, no LE edema, trace -1+ dorsalis pedis bilat, trace ankle edema bilat Psychiatric: Pt behavior is normal. No agitation.     Assessment & Plan:

## 2015-02-01 NOTE — Progress Notes (Signed)
Pre visit review using our clinic review tool, if applicable. No additional management support is needed unless otherwise documented below in the visit note. 

## 2015-02-01 NOTE — Assessment & Plan Note (Addendum)
Trace bilat ankle edema, ? Mild diast dysfxn vs dependent edema, declines increase lasix,  to f/u any worsening symptoms or concerns

## 2015-02-01 NOTE — Patient Instructions (Addendum)
Please continue all other medications as before, and refills have been done if requested.  Please have the pharmacy call with any other refills you may need.  Please continue your efforts at being more active, low cholesterol diet, and weight control.  Please keep your appointments with your specialists as you may have planned  You will be contacted regarding the referral for: Leg circulation testing  Please return in 6 months, or sooner if needed

## 2015-02-03 NOTE — Assessment & Plan Note (Signed)
>>  ASSESSMENT AND PLAN FOR HTN (HYPERTENSION) WRITTEN ON 02/03/2015 12:56 PM BY [name], [name], MD  stable overall by history and exam, recent data reviewed with pt, and pt to continue medical treatment as before,  to f/u any worsening symptoms or concerns BP Readings from Last 3 Encounters:  02/01/15 116/66  12/07/14 120/56  07/13/14 120/76

## 2015-02-03 NOTE — Assessment & Plan Note (Signed)
Cannot r/o PVD - for LE ABI and arterial dopplers

## 2015-02-03 NOTE — Assessment & Plan Note (Signed)
stable overall by history and exam, recent data reviewed with pt, and pt to continue medical treatment as before,  to f/u any worsening symptoms or concerns Lab Results  Component Value Date   LDLCALC 86 12/05/2014   For f/u lipids

## 2015-02-03 NOTE — Assessment & Plan Note (Signed)
stable overall by history and exam, recent data reviewed with pt, and pt to continue medical treatment as before,  to f/u any worsening symptoms or concerns BP Readings from Last 3 Encounters:  02/01/15 116/66  12/07/14 120/56  07/13/14 120/76

## 2015-02-07 ENCOUNTER — Other Ambulatory Visit: Payer: Self-pay | Admitting: Internal Medicine

## 2015-02-07 DIAGNOSIS — M79604 Pain in right leg: Secondary | ICD-10-CM

## 2015-02-07 DIAGNOSIS — M79605 Pain in left leg: Principal | ICD-10-CM

## 2015-02-07 DIAGNOSIS — R0989 Other specified symptoms and signs involving the circulatory and respiratory systems: Secondary | ICD-10-CM

## 2015-02-10 ENCOUNTER — Inpatient Hospital Stay (HOSPITAL_COMMUNITY): Admission: RE | Admit: 2015-02-10 | Payer: Medicare Other | Source: Ambulatory Visit

## 2015-02-15 ENCOUNTER — Other Ambulatory Visit (HOSPITAL_COMMUNITY): Payer: Self-pay | Admitting: Internal Medicine

## 2015-02-15 ENCOUNTER — Other Ambulatory Visit: Payer: Self-pay | Admitting: Cardiology

## 2015-02-15 ENCOUNTER — Other Ambulatory Visit: Payer: Self-pay | Admitting: Internal Medicine

## 2015-02-15 ENCOUNTER — Ambulatory Visit (HOSPITAL_COMMUNITY)
Admission: RE | Admit: 2015-02-15 | Discharge: 2015-02-15 | Disposition: A | Discharge status: Home / Self Care | Payer: Medicare Other | Source: Ambulatory Visit | Attending: Internal Medicine | Admitting: Internal Medicine

## 2015-02-15 DIAGNOSIS — I70203 Unspecified atherosclerosis of native arteries of extremities, bilateral legs: Secondary | ICD-10-CM | POA: Diagnosis not present

## 2015-02-15 DIAGNOSIS — E785 Hyperlipidemia, unspecified: Secondary | ICD-10-CM | POA: Diagnosis not present

## 2015-02-15 DIAGNOSIS — I10 Essential (primary) hypertension: Secondary | ICD-10-CM | POA: Insufficient documentation

## 2015-02-15 DIAGNOSIS — M79605 Pain in left leg: Principal | ICD-10-CM

## 2015-02-15 DIAGNOSIS — R0989 Other specified symptoms and signs involving the circulatory and respiratory systems: Secondary | ICD-10-CM | POA: Diagnosis not present

## 2015-02-15 DIAGNOSIS — F4329 Adjustment disorder with other symptoms: Secondary | ICD-10-CM | POA: Diagnosis not present

## 2015-02-15 DIAGNOSIS — M79604 Pain in right leg: Secondary | ICD-10-CM | POA: Diagnosis not present

## 2015-02-21 ENCOUNTER — Other Ambulatory Visit: Payer: Self-pay | Admitting: Internal Medicine

## 2015-02-21 DIAGNOSIS — I739 Peripheral vascular disease, unspecified: Secondary | ICD-10-CM

## 2015-02-21 DIAGNOSIS — M792 Neuralgia and neuritis, unspecified: Secondary | ICD-10-CM | POA: Diagnosis not present

## 2015-02-21 DIAGNOSIS — M542 Cervicalgia: Secondary | ICD-10-CM | POA: Diagnosis not present

## 2015-02-22 ENCOUNTER — Other Ambulatory Visit: Payer: Self-pay | Admitting: *Deleted

## 2015-02-22 DIAGNOSIS — E785 Hyperlipidemia, unspecified: Secondary | ICD-10-CM

## 2015-02-24 DIAGNOSIS — H43813 Vitreous degeneration, bilateral: Secondary | ICD-10-CM | POA: Diagnosis not present

## 2015-02-24 DIAGNOSIS — H3532 Exudative age-related macular degeneration: Secondary | ICD-10-CM | POA: Diagnosis not present

## 2015-03-01 ENCOUNTER — Other Ambulatory Visit: Payer: Self-pay

## 2015-03-01 ENCOUNTER — Encounter: Payer: Self-pay | Admitting: Internal Medicine

## 2015-03-01 ENCOUNTER — Ambulatory Visit (INDEPENDENT_AMBULATORY_CARE_PROVIDER_SITE_OTHER): Payer: Medicare Other | Admitting: Internal Medicine

## 2015-03-01 VITALS — BP 132/58 | HR 60 | Ht 64.0 in | Wt 152.0 lb

## 2015-03-01 DIAGNOSIS — I10 Essential (primary) hypertension: Secondary | ICD-10-CM | POA: Diagnosis not present

## 2015-03-01 DIAGNOSIS — I471 Supraventricular tachycardia: Secondary | ICD-10-CM | POA: Diagnosis not present

## 2015-03-01 MED ORDER — DILTIAZEM HCL 30 MG PO TABS: ORAL_TABLET | ORAL | Status: DC

## 2015-03-01 MED ORDER — ATENOLOL 25 MG PO TABS: 25.0000 mg | ORAL_TABLET | Freq: Every day | ORAL | Status: DC

## 2015-03-01 NOTE — Patient Instructions (Addendum)
Medication Instructions:  Your physician has recommended you make the following change in your medication:  1) Stop Digoxin 2) Decrease Atenolol to  daily 3) Take Cardizem 30 mg 1-2 tablets by mouth every 6 hours as needed for fast heart rate   Labwork: None ordered   Testing/Procedures: None ordered  Follow-Up: Your physician recommends that you schedule a follow-up appointment in: 2 weeks with Dr Johney Frame    Any Other Special Instructions Will Be Listed Below (If Applicable).

## 2015-03-03 ENCOUNTER — Encounter: Payer: Self-pay | Admitting: Internal Medicine

## 2015-03-03 NOTE — Progress Notes (Signed)
Electrophysiology Office Note   Date:  03/03/2015   ID:  Alexandra Reyes, Alexandra Reyes August 17, 1925, MRN [id number]  PCP:  Oliver Barre, MD  Cardiologist:  Dr Patty Sermons Primary Electrophysiologist: Hillis Range, MD    Chief Complaint  Patient presents with  . PSVT     History of Present Illness: Alexandra Reyes is a 81 y.o. female who presents today for electrophysiology evaluation.   She is a very interesting individual.  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  She is now referred by Dr Patty Sermons for SVT. She reports having episodic tachycardia "for years".  She has been treated previously with digoxin and subsequently atenolol for "PAT".  She has been able to terminate her tachycardia at times with vagal maneuvers and also finds that be resting and taking additional digoxin, episodes will typically resolve within several hours.  Episodes have been a little more frequent recently.  They typical occur in the evenings without known trigger.  She reports associated palpitations of abrupt onset/ offset.  She feels "washed out" afterwards.  She has worn an event monitor recently which reveals long RP tachycardia.   Past Medical History  Diagnosis Date  . PAT (paroxysmal atrial tachycardia)   . Macular degeneration   . OA (osteoarthritis)   . Hiatal hernia   . GERD (gastroesophageal reflux disease)   . Cholelithiasis   . COPD (chronic obstructive pulmonary disease)   . HTN (hypertension) 05/18/2011  . Hyperlipidemia 05/18/2011  . Osteoporosis 05/18/2011  . Anemia, iron deficiency 05/18/2011  . DDD (degenerative disc disease), lumbar 05/18/2011  . Pectus excavatum 05/18/2011  . Allergy   . Allergic rhinitis, cause unspecified 05/22/2011  . Alcohol dependence in remission 05/22/2011  . Personal history of colonic polyps 10/28/2012  . First degree AV block   . SVT (supraventricular tachycardia)     Past Surgical History  Procedure Laterality Date  . Tonsillectomy and adenoidectomy    . Breast lumpectomy    . US echocardiography  01/17/2003    EF 55-60%  . Cardiovascular stress test  03/08/2009    EF 84%  . Abdominal hysterectomy  1988  . Breast biopsy  1948     Current Outpatient Prescriptions  Medication Sig Dispense Refill  . ALPRAZolam (XANAX) 0.25 MG tablet Take 0.25 mg by mouth daily as needed for anxiety (tachycardia).    Marland Kitchen amLODipine (NORVASC) 2.5 MG tablet Take 1 tablet (2.5 mg total) by mouth daily. 90 tablet 1  . Ascorbic Acid (VITAMIN C PO) Take 1 tablet by mouth daily.     Marland Kitchen aspirin 81 MG tablet Take 81 mg by mouth daily.      Marland Kitchen atenolol (TENORMIN) 25 MG tablet Take 1 tablet (25 mg total) by mouth daily. 100 tablet PRN  . B Complex Vitamins (VITAMIN B COMPLEX PO) Take 1 tablet by mouth daily.     . calcium carbonate (CALCIUM 600) 600 MG TABS Take 600 mg by mouth daily.      Marland Kitchen diltiazem (CARDIZEM) 30 MG tablet Take 1-2 tablets by mouth every 6 hours as needed for fast heart rates 30 tablet 1  . ferrous sulfate 325 (65 FE) MG tablet Take 325 mg by mouth daily.     . furosemide (LASIX) 40 MG tablet Take by mouth as directed. Take 40 mg by mouth in the morning and  by mouth in the evening    .  Glucosamine HCl-MSM (GLUCOSAMINE-MSM PO) Take 2 tablets by mouth daily.     Marland Kitchen ibuprofen (ADVIL,MOTRIN) 200 MG tablet Take 200 mg by mouth every 6 (six) hours as needed (pain).     . simvastatin (ZOCOR) 20 MG tablet TAKE 1 TABLET BY MOUTH DAILY AT BEDTIME (GENERIC FOR ZOCOR) 90 tablet 0  . tiotropium (SPIRIVA) 18 MCG inhalation capsule Place 18 mcg into inhaler and inhale every other day.     . triamcinolone cream (KENALOG) 0.1 % Apply 1 application topically 2 (two) times daily. (Patient taking differently: Apply 1 application topically 2 (two) times daily as needed (for skin). ) 30 g 0  . vitamin E 200 UNIT capsule Take 200 Units by mouth daily.     No current  facility-administered medications for this visit.    Allergies:   Lisinopril and Pneumovax 23   Social History:  The patient  reports that she quit smoking about 12 years ago. She does not have any smokeless tobacco history on file. She reports that she does not drink alcohol or use illicit drugs.   Family History:  The patient's family history includes Arthritis in her mother; Heart disease in her father; Heart failure in her mother; Hypertension in her father; Kidney failure in her father.    ROS:  Please see the history of present illness.   All other systems are reviewed and negative.    PHYSICAL EXAM: VS:  BP 132/58 mmHg  Pulse 60  Ht  (1.626 m)  Wt 152 lb (68.947 kg)  BMI 26.08 kg/m2 , BMI Body mass index is 26.08 kg/(m^2). GEN: elderly, in no acute distress HEENT: normal Neck: no JVD, carotid bruits, or masses Cardiac: RRR; no murmurs, rubs, or gallops,no edema  Respiratory:  clear to auscultation bilaterally, normal work of breathing GI: soft, nontender, nondistended, + BS MS: no deformity or atrophy Skin: warm and dry  Neuro:  Strength and sensation are intact Psych: euthymic mood, full affect  EKG:  EKG reveals sinus rhythm 60 bpm with first degree AV block, PR 272 msec, anteroseptal and inferior infarct pattern   Recent Labs: 12/05/2014: ALT 17; BUN 11; Creatinine, Ser 0.80; Potassium 4.3; Sodium 136    Lipid Panel     Component Value Date/Time   CHOL 158 12/05/2014 0925   TRIG 123.0 12/05/2014 0925   HDL 47.10 12/05/2014 0925   CHOLHDL 3 12/05/2014 0925   VLDL 24.6 12/05/2014 0925   LDLCALC 86 12/05/2014 0925   LDLDIRECT 129.2 05/15/2011 0925     Wt Readings from Last 3 Encounters:  03/01/15 152 lb (68.947 kg)  02/01/15 153 lb (69.4 kg)  12/07/14 154 lb 3.2 oz (69.945 kg)      Other studies Reviewed: Additional studies/ records that were reviewed today include: DR Brackbills notes, echo/myoview 2015,  75 pages of event monitor is also  reviewed Review of the above records today demonstrates: long RP tachycardia at 150 bpm   ASSESSMENT AND PLAN:  1.  SVT Long RP,  Likely atach with first degree AV block though I cannot exclude a reentrant arrhythmia.  She has advanced age and conduction system disease by ekg.  I therefore suspect that her risks of AV block with ablation are quite high.  I would advise that we therefore try medical changes first. I will stop digoxin and reduce atenolol to  daily Add prn cardizem Return in 2 weeks with reassessment of AV conduction and decision at that time as to whether we try diltiazem  as a long term option, add an AAD (amio), or consider ablation accepting higher associated risks.   2. HTN Stable No change required today   Return to see me in 2 weeks   Signed, Hillis Range, MD  03/03/2015 9:35 AM     [facility] [address] [place] [address] [phone] (office) [phone] (fax)

## 2015-03-07 NOTE — Addendum Note (Signed)
Addended by: Reesa Chew on: 03/07/2015 03:18 PM   Modules accepted: Orders

## 2015-03-13 ENCOUNTER — Other Ambulatory Visit: Payer: Self-pay | Admitting: Cardiology

## 2015-03-14 NOTE — Telephone Encounter (Signed)
Dr Johney Frame stopped her digoxin on 03/03/15.

## 2015-03-14 NOTE — Telephone Encounter (Signed)
Pharmacy is requesting Lanoxin 125 mg.   I cannot find this medication in her chart. Please advise. Thank you.

## 2015-03-15 ENCOUNTER — Ambulatory Visit (INDEPENDENT_AMBULATORY_CARE_PROVIDER_SITE_OTHER): Payer: Medicare Other | Admitting: Internal Medicine

## 2015-03-15 ENCOUNTER — Encounter: Payer: Self-pay | Admitting: Internal Medicine

## 2015-03-15 VITALS — BP 120/72 | HR 61 | Ht 64.0 in | Wt 155.4 lb

## 2015-03-15 DIAGNOSIS — I471 Supraventricular tachycardia: Secondary | ICD-10-CM | POA: Diagnosis not present

## 2015-03-15 DIAGNOSIS — I10 Essential (primary) hypertension: Secondary | ICD-10-CM | POA: Diagnosis not present

## 2015-03-15 MED ORDER — DILTIAZEM HCL ER COATED BEADS 120 MG PO CP24: 120.0000 mg | ORAL_CAPSULE | Freq: Every day | ORAL | Status: DC

## 2015-03-15 NOTE — Patient Instructions (Signed)
Medication Instructions:  1) Start Diltiazem 120 mg by mouth once daily  Labwork: N/A   Testing/Procedures: N/A  Follow-Up: Your physician wants you to follow-up in: 3 weeks with Dr. Johney Frame   Any Other Special Instructions Will Be Listed Below (If Applicable).

## 2015-03-17 NOTE — Progress Notes (Signed)
Electrophysiology Office Note   Date:  03/17/2015   ID:  Alexandra Reyes 1925-07-17, [name]  PCP:  Oliver Barre, MD  Cardiologist:  Dr Patty Sermons Primary Electrophysiologist: Hillis Range, MD    Chief Complaint  Patient presents with  . PSVT     History of Present Illness: Alexandra Reyes is a 81 y.o. female who presents today for electrophysiology evaluation.   She is a very interesting individual.  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  She is now referred by Dr Patty Sermons for SVT. She reports having episodic tachycardia "for years".  She has been treated previously with digoxin and subsequently atenolol for "PAT".  She has been able to terminate her tachycardia at times with vagal maneuvers and also finds that be resting and taking additional digoxin, episodes will typically resolve within several hours.  They typical occur in the evenings without known trigger.  She reports associated palpitations of abrupt onset/ offset.  She feels "washed out" afterwards.  She has worn an event monitor recently which reveals long RP tachycardia.  I have stopped digoxin and reduce atenolol.  She has done ok with occasional episodes.   Past Medical History  Diagnosis Date  . PAT (paroxysmal atrial tachycardia) (HCC)   . Macular degeneration   . OA (osteoarthritis)   . Hiatal hernia   . GERD (gastroesophageal reflux disease)   . Cholelithiasis   . COPD (chronic obstructive pulmonary disease) (HCC)   . HTN (hypertension) 05/18/2011  . Hyperlipidemia 05/18/2011  . Osteoporosis 05/18/2011  . Anemia, iron deficiency 05/18/2011  . DDD (degenerative disc disease), lumbar 05/18/2011  . Pectus excavatum 05/18/2011  . Allergy   . Allergic rhinitis, cause unspecified 05/22/2011  . Alcohol dependence in remission (HCC) 05/22/2011  . Personal history of colonic polyps 10/28/2012  . First degree AV  block   . SVT (supraventricular tachycardia) [facility])    Past Surgical History  Procedure Laterality Date  . Tonsillectomy and adenoidectomy    . Breast lumpectomy    . US echocardiography  01/17/2003    EF 55-60%  . Cardiovascular stress test  03/08/2009    EF 84%  . Abdominal hysterectomy  1988  . Breast biopsy  1948     Current Outpatient Prescriptions  Medication Sig Dispense Refill  . ALPRAZolam (XANAX) 0.25 MG tablet Take 0.25 mg by mouth daily as needed for anxiety (tachycardia).    Marland Kitchen amLODipine (NORVASC) 2.5 MG tablet Take 1 tablet (2.5 mg total) by mouth daily. 90 tablet 1  . Ascorbic Acid (VITAMIN C PO) Take 1 tablet by mouth daily.     Marland Kitchen aspirin 81 MG tablet Take 81 mg by mouth daily.      Marland Kitchen atenolol (TENORMIN) 25 MG tablet Take 1 tablet (25 mg total) by mouth daily. 100 tablet PRN  . B Complex Vitamins (VITAMIN B COMPLEX PO) Take 1 tablet by mouth daily.     . calcium carbonate (CALCIUM 600) 600 MG TABS Take 600 mg by mouth daily.      Marland Kitchen diltiazem (CARDIZEM) 30 MG tablet Take 1-2 tablets by mouth every 6 hours as needed for fast heart rates 30 tablet 1  . ferrous sulfate 325 (65 FE) MG tablet Take 325 mg by mouth daily.     . furosemide (LASIX) 40 MG tablet Take by mouth as directed. Take 40 mg by mouth in  the morning and  by mouth in the evening    . Glucosamine HCl-MSM (GLUCOSAMINE-MSM PO) Take 2 tablets by mouth daily.     Marland Kitchen ibuprofen (ADVIL,MOTRIN) 200 MG tablet Take 200 mg by mouth every 6 (six) hours as needed (pain).     . simvastatin (ZOCOR) 20 MG tablet TAKE 1 TABLET BY MOUTH DAILY AT BEDTIME (GENERIC FOR ZOCOR) 90 tablet 0  . tiotropium (SPIRIVA) 18 MCG inhalation capsule Place 18 mcg into inhaler and inhale every other day.     . triamcinolone cream (KENALOG) 0.1 % Apply 1 application topically 2 (two) times daily. (Patient taking differently: Apply 1 application topically 2 (two) times daily as needed (for skin). ) 30 g 0  . vitamin E 200 UNIT capsule Take 200  Units by mouth daily.    Marland Kitchen diltiazem (CARDIZEM CD) 120 MG 24 hr capsule Take 1 capsule (120 mg total) by mouth daily. 90 capsule 3   No current facility-administered medications for this visit.    Allergies:   Lisinopril and Pneumovax 23   Social History:  The patient  reports that she quit smoking about 12 years ago. She does not have any smokeless tobacco history on file. She reports that she does not drink alcohol or use illicit drugs.   Family History:  The patient's family history includes Arthritis in her mother; Heart disease in her father; Heart failure in her mother; Hypertension in her father; Kidney failure in her father.    ROS:  Please see the history of present illness.   All other systems are reviewed and negative.    PHYSICAL EXAM: VS:  BP 120/72 mmHg  Pulse 61  Ht  (1.626 m)  Wt 155 lb 6.4 oz (70.489 kg)  BMI 26.66 kg/m2 , BMI Body mass index is 26.66 kg/(m^2). GEN: elderly, in no acute distress HEENT: normal Neck: no JVD, carotid bruits, or masses Cardiac: RRR; no murmurs, rubs, or gallops,no edema  Respiratory:  clear to auscultation bilaterally, normal work of breathing GI: soft, nontender, nondistended, + BS MS: no deformity or atrophy Skin: warm and dry  Neuro:  Strength and sensation are intact Psych: euthymic mood, full affect  EKG:  EKG reveals sinus rhythm with first degree AV block,  anteroseptal and inferior infarct pattern   Recent Labs: 12/05/2014: ALT 17; BUN 11; Creatinine, Ser 0.80; Potassium 4.3; Sodium 136    Lipid Panel     Component Value Date/Time   CHOL 158 12/05/2014 0925   TRIG 123.0 12/05/2014 0925   HDL 47.10 12/05/2014 0925   CHOLHDL 3 12/05/2014 0925   VLDL 24.6 12/05/2014 0925   LDLCALC 86 12/05/2014 0925   LDLDIRECT 129.2 05/15/2011 0925     Wt Readings from Last 3 Encounters:  03/15/15 155 lb 6.4 oz (70.489 kg)  03/01/15 152 lb (68.947 kg)  02/01/15 153 lb (69.4 kg)     ASSESSMENT AND PLAN:  1.  SVT Long  RP,  Likely atach with first degree AV block though I cannot exclude a reentrant arrhythmia.  She has advanced age and conduction system disease by ekg.  I therefore suspect that her risks of AV block with ablation are quite high.  I would advise that we therefore try medical changes first.  Add daily cardizem CD  daily and continue prn cardizem  Return in 3 weeks with reassessment of AV conduction and decision at that time as to whether we try increasing diltiazem as a long term option, add an AAD (  multaq vs amio), or consider ablation accepting higher associated risks.  2. HTN Stable No change required today   Return to see me in 3 weeks   Signed, Hillis Range, MD  03/17/2015 10:40 AM     [facility] [address] [place] [address] [phone] (office) [phone] (fax)

## 2015-03-22 DIAGNOSIS — F4329 Adjustment disorder with other symptoms: Secondary | ICD-10-CM | POA: Diagnosis not present

## 2015-03-29 ENCOUNTER — Telehealth: Payer: Self-pay | Admitting: Internal Medicine

## 2015-03-29 ENCOUNTER — Encounter: Payer: Self-pay | Admitting: *Deleted

## 2015-03-29 MED ORDER — DILTIAZEM HCL ER COATED BEADS 120 MG PO CP24: 240.0000 mg | ORAL_CAPSULE | Freq: Every day | ORAL | Status: DC

## 2015-03-29 NOTE — Telephone Encounter (Signed)
Follow up     Calling to see if the nurse had an answer for her.  She is going to run some errands and hope to talk to you at the end of the day

## 2015-03-29 NOTE — Telephone Encounter (Addendum)
Patient tells me that she is having chest tightness/SOB associated w/ SVT episodes. Episodes have been occurring daily for past 3 days. BPs avg 130-140s/60-70s. Diltiazem 120 mg QD was started earlier this month at OV w/ Dr. [name]. Patient aware we will review this with him for recommendations. She requested we call her back before 3:30 as she leaves for bible study then.

## 2015-03-29 NOTE — Telephone Encounter (Signed)
New Message  Pt c/o medication issue: 1. Name of Medication: diltiazem (CARDIZEM) 30 MG tablet 2. How are you currently taking this medication (dosage and times per day)? 1 time per day  3. Are you having a reaction (difficulty breathing--STAT)?  SOB and chest tightness.  4. What is your medication issue? Pt states that she is still having tachycardia   Pt c/o Shortness Of Breath: STAT if SOB developed within the last 24 hours or pt is noticeably SOB on the phone  1. Are you currently SOB (can you hear that pt is SOB on the phone)? No  2. How long have you been experiencing SOB? Everyday she has some sort of SOB  3. Are you SOB when sitting or when up moving around? Moving around 4.  Are you currently experiencing any other symptoms? Chest tightness  Pt c/o of Chest Pain: 1. Are you having CP right now? No Converted over to normal rhythm, 2. Are you experiencing any other symptoms (ex. SOB, nausea, vomiting, sweating)? No  3. How long have you been experiencing CP? Today for about 25 min. When she is in tachycardia  4. Is your CP continuous or coming and going?  Coming and going  5. Have you taken Nitroglycerin? No nitros needed   Pt states that she has been experienceing this for 3 days

## 2015-03-29 NOTE — Telephone Encounter (Signed)
Reviewed with Dr. [name]. Order to increase Diltiazem to 240 mg daily. Patient is going to double up on her 120 mg tablets and see how she responds to 240 mg dosing.  She will call office when she is ready for new rx to be sent and filled. Advised to monitor and call us if symptoms persist or do not improve. Patient verbalized understanding and agreeable to plan.

## 2015-03-31 ENCOUNTER — Encounter: Payer: Self-pay | Admitting: Surgery

## 2015-04-03 ENCOUNTER — Ambulatory Visit (INDEPENDENT_AMBULATORY_CARE_PROVIDER_SITE_OTHER): Payer: Medicare Other | Admitting: Surgery

## 2015-04-03 ENCOUNTER — Encounter: Payer: Self-pay | Admitting: Surgery

## 2015-04-03 VITALS — BP 129/59 | HR 64 | Ht 64.0 in | Wt 157.0 lb

## 2015-04-03 DIAGNOSIS — I70213 Atherosclerosis of native arteries of extremities with intermittent claudication, bilateral legs: Secondary | ICD-10-CM | POA: Diagnosis not present

## 2015-04-03 NOTE — Progress Notes (Signed)
Patient name: Alexandra Reyes MRN: [id number] DOB: [birth year] Sex: female   Referred by: Dr. Jonny Ruiz  Reason for referral:  Chief Complaint  Patient presents with  . New Evaluation    eval PVD - c/o swelling and aching after walking     HISTORY OF PRESENT ILLNESS: This is a very pleasant 81 year old female who is referred today for evaluation of peripheral vascular disease.  The patient states that when she walks a very short distance that she gets aching pain in her legs.  Her walking is also limited by her shortness of breath.  She states that she has a chronic history of tachycardia.  She has recently undergone some medication changes.  She currently has significant lower extremity edema.  She does not have any wounds or rest pain.  She denies any cramping of her calfs with activity.  The patient suffers from hypercholesterolemia.  This is managed with a statin.  She is on multiple medications for hypertension.  She is a former smoker.  Past Medical History  Diagnosis Date  . PAT (paroxysmal atrial tachycardia) (HCC)   . Macular degeneration   . OA (osteoarthritis)   . Hiatal hernia   . GERD (gastroesophageal reflux disease)   . Cholelithiasis   . COPD (chronic obstructive pulmonary disease) (HCC)   . HTN (hypertension) 05/18/2011  . Hyperlipidemia 05/18/2011  . Osteoporosis 05/18/2011  . Anemia, iron deficiency 05/18/2011  . DDD (degenerative disc disease), lumbar 05/18/2011  . Pectus excavatum 05/18/2011  . Allergy   . Allergic rhinitis, cause unspecified 05/22/2011  . Alcohol dependence in remission (HCC) 05/22/2011  . Personal history of colonic polyps 10/28/2012  . First degree AV block   . SVT (supraventricular tachycardia) (HCC)   . Myocardial infarction [facility])     Past Surgical History  Procedure Laterality Date  . Tonsillectomy and adenoidectomy    . Breast lumpectomy    . US echocardiography  01/17/2003    EF 55-60%  . Cardiovascular stress test  03/08/2009    EF  84%  . Abdominal hysterectomy  1988  . Breast biopsy  1948    Social History   Social History  . Marital Status: Widowed    Spouse Name: N/A  . Number of Children: N/A  . Years of Education: 11   Occupational History  . Retired Designer, jewellery    Social History Main Topics  . Smoking status: Former Smoker    Quit date: 01/24/2003  . Smokeless tobacco: Not on file  . Alcohol Use: No  . Drug Use: No  . Sexual Activity: Not on file   Other Topics Concern  . Not on file   Social History Narrative    Family History  Problem Relation Age of Onset  . Heart failure Mother   . Arthritis Mother   . Kidney failure Father   . Heart disease Father   . Hypertension Father     Allergies as of 04/03/2015 - Review Complete 04/03/2015  Allergen Reaction Noted  . Lisinopril  01/24/2011  . Pneumovax 23 [pneumococcal vac polyvalent] Other (See Comments) 04/28/2013    Current Outpatient Prescriptions on File Prior to Visit  Medication Sig Dispense Refill  . ALPRAZolam (XANAX) 0.25 MG tablet Take 0.25 mg by mouth daily as needed for anxiety (tachycardia).    Marland Kitchen amLODipine (NORVASC) 2.5 MG tablet Take 1 tablet (2.5 mg total) by mouth daily. 90 tablet 1  . Ascorbic Acid (VITAMIN C PO) Take 1 tablet by  mouth daily.     Marland Kitchen aspirin 81 MG tablet Take 81 mg by mouth daily.      Marland Kitchen atenolol (TENORMIN) 25 MG tablet Take 1 tablet (25 mg total) by mouth daily. 100 tablet PRN  . B Complex Vitamins (VITAMIN B COMPLEX PO) Take 1 tablet by mouth daily.     . calcium carbonate (CALCIUM 600) 600 MG TABS Take 600 mg by mouth daily.      Marland Kitchen diltiazem (CARDIZEM CD) 120 MG 24 hr capsule Take 2 capsules (240 mg total) by mouth daily. 180 capsule 2  . diltiazem (CARDIZEM) 30 MG tablet Take 1-2 tablets by mouth every 6 hours as needed for fast heart rates 30 tablet 1  . ferrous sulfate 325 (65 FE) MG tablet Take 325 mg by mouth daily.     . furosemide (LASIX) 40 MG tablet Take by mouth as directed. Take 40 mg  by mouth in the morning and  by mouth in the evening    . Glucosamine HCl-MSM (GLUCOSAMINE-MSM PO) Take 2 tablets by mouth daily.     Marland Kitchen ibuprofen (ADVIL,MOTRIN) 200 MG tablet Take 200 mg by mouth every 6 (six) hours as needed (pain).     . simvastatin (ZOCOR) 20 MG tablet TAKE 1 TABLET BY MOUTH DAILY AT BEDTIME (GENERIC FOR ZOCOR) 90 tablet 0  . tiotropium (SPIRIVA) 18 MCG inhalation capsule Place 18 mcg into inhaler and inhale every other day.     . triamcinolone cream (KENALOG) 0.1 % Apply 1 application topically 2 (two) times daily. (Patient taking differently: Apply 1 application topically 2 (two) times daily as needed (for skin). ) 30 g 0  . vitamin E 200 UNIT capsule Take 200 Units by mouth daily.     No current facility-administered medications on file prior to visit.     REVIEW OF SYSTEMS: Cardiovascular: No chest pain.  Positive for chest pressure, palpitations, shortness of breath with exertion and walking.  Positive for leg swelling. Pulmonary: No productive cough, asthma or wheezing. Neurologic: No weakness, paresthesias, aphasia, or amaurosis. No dizziness. Hematologic: No bleeding problems or clotting disorders. Musculoskeletal: No joint pain or joint swelling. Gastrointestinal: No blood in stool or hematemesis Genitourinary: No dysuria or hematuria. Psychiatric:: No history of major depression. Integumentary: No rashes or ulcers. Constitutional: No fever or chills.  PHYSICAL EXAMINATION:  Filed Vitals:   04/03/15 1103  BP: 129/59  Pulse: 64  Height:  (1.626 m)  Weight: 157 lb (71.215 kg)  SpO2: 98%   Body mass index is 26.94 kg/(m^2). General: The patient appears their stated age.   HEENT:  No gross abnormalities Pulmonary: Respirations are non-labored Musculoskeletal: There are no major deformities.   Neurologic: No focal weakness or paresthesias are detected, Skin: There are no ulcer or rashes noted. Psychiatric: The patient has normal  affect. Cardiovascular: There is a regular rate and rhythm without significant murmur appreciated.  2+ pitting edema bilaterally.  I can't feel her pulses because of the edema.  No carotid bruits.  Diagnostic Studies: I have reviewed her outside ultrasound.  Her ABI on the right is 0.89.  On the left measures 0.98.  She does have 50-74% stenosis and bilateral superficial femoral arteries.  The velocity profile is 289 on the left and 262 on the right   Assessment:  Atherosclerosis with claudication Plan: After reviewing the patient's ultrasound and listening to her symptoms, I would not recommend intervention at this time.  By ultrasound the stenosis would not meet criteria for  intervention.  She does have significant swelling in both lower extremities likely secondary to her cardiac issues.  I think she would benefit from compression stockings.  I'm giving her a fitting for 20-30 mm thigh-high compression.  This should help with her swelling which should also help with some of her symptoms.  I have her coming back in one year for surveillance follow-up.  If she experiences worsening symptoms she will call me in follow up sooner.     Jorge NyV. Wells Brabham IV, M.D. [facility] Office: [phone] Pager:  [phone]

## 2015-04-04 NOTE — Addendum Note (Signed)
Addended by: Adria DillELDRIDGE-LEWIS, Marda Breidenbach L on: 04/04/2015 03:23 PM   Modules accepted: Orders

## 2015-04-06 ENCOUNTER — Encounter (INDEPENDENT_AMBULATORY_CARE_PROVIDER_SITE_OTHER): Payer: Medicare Other

## 2015-04-06 DIAGNOSIS — I70213 Atherosclerosis of native arteries of extremities with intermittent claudication, bilateral legs: Secondary | ICD-10-CM

## 2015-04-12 ENCOUNTER — Telehealth: Payer: Self-pay | Admitting: Internal Medicine

## 2015-04-12 MED ORDER — DRONEDARONE HCL 400 MG PO TABS: 400.0000 mg | ORAL_TABLET | Freq: Two times a day (BID) | ORAL | Status: DC

## 2015-04-12 MED ORDER — DILTIAZEM HCL ER COATED BEADS 120 MG PO CP24: 240.0000 mg | ORAL_CAPSULE | Freq: Every day | ORAL | Status: DC

## 2015-04-12 NOTE — Telephone Encounter (Signed)
I have discussed with Dr [name] and we will decrease her Cardizem back 120mg  daily, start Multaq 400mg  twice daily, with the ability to take the Cardizem 30 mg as needed for breakthrough fast heart rates.  Patient is aware and will keep her follow up appointment as scheduled

## 2015-04-12 NOTE — Telephone Encounter (Signed)
New message      Pt states on Sunday, she had a tachycardia episode for 3.5 hrs. Yesterday, she was weak and so sob that she could not talk a lot.  This am, at 7am pt was in tachycardia again.  It was off and on for about 45 minutes.  She has an appt this Monday.  What should she do until her appt on Monday?

## 2015-04-15 ENCOUNTER — Telehealth: Payer: Self-pay | Admitting: Physician Assistant

## 2015-04-15 NOTE — Telephone Encounter (Signed)
Patient called Saturday answering service. Sees Dr. [name] for SVT - per phone notes he recently cut her Cardizem back to 120mg  daily and started Multaq 400mg  BID. She has not had any breakthrough tachycardia since beginning the Multaq.  Today she noticed her HR and BP were running lower. Took vitals - BP 106/57, HR 45-52. She felt mildly "light in the head" at the time, but otherwise felt fine. No CP, SOB, palpitations, near-syncope or syncope. She rechecked her vitals shortly after without intervention - BP now 140 systolic, HR 60bpm.  She is afraid the Multaq has caused these vitals. She wants to decrease this to once daily. We had a long discussion about all of her medications. I told her I think the Multaq could cause a slower HR, but it does not typically cause a lower BP. I think it's been beneficial that she has maintained NSR while on Multaq. I cannot be sure that she would continue to do so on a subtherapeutic dose of Multaq. My preference instead would be for her to cut her atenolol instead to 1/2 tablet daily but she insists she would prefer instead to try cutting the Multaq to once a day. She graciously says she does not mean to tell me what she's already planning to do, but she has decided that is what she wants to try. She has PRN short-acting diltiazem on hand to use if the SVT returns and knows not to use it if BP is too low. I asked her to keep a log of her HR and BP to bring in to her appointment on Monday with Dr. [name].  I also notice she's on amlodipine and diltiazem together but she told me Dr. [name] specifically wanted to continue amlodipine for her BP when dilt was added. Will defer to him when he sees in f/u.   Dayna Dunn PA-C

## 2015-04-17 ENCOUNTER — Encounter: Payer: Self-pay | Admitting: Internal Medicine

## 2015-04-17 ENCOUNTER — Ambulatory Visit (INDEPENDENT_AMBULATORY_CARE_PROVIDER_SITE_OTHER): Payer: Medicare Other | Admitting: Internal Medicine

## 2015-04-17 VITALS — BP 130/62 | HR 67 | Ht 64.0 in | Wt 154.0 lb

## 2015-04-17 DIAGNOSIS — I70213 Atherosclerosis of native arteries of extremities with intermittent claudication, bilateral legs: Secondary | ICD-10-CM | POA: Diagnosis not present

## 2015-04-17 DIAGNOSIS — I471 Supraventricular tachycardia: Secondary | ICD-10-CM

## 2015-04-17 DIAGNOSIS — I10 Essential (primary) hypertension: Secondary | ICD-10-CM

## 2015-04-17 LAB — BASIC METABOLIC PANEL
BUN: 14 mg/dL (ref 7–25)
CALCIUM: 9.4 mg/dL (ref 8.6–10.4)
CO2: 24 mmol/L (ref 20–31)
Chloride: 95 mmol/L — ABNORMAL LOW (ref 98–110)
Creat: 0.85 mg/dL (ref 0.60–0.88)
GLUCOSE: 91 mg/dL (ref 65–99)
Potassium: 4.2 mmol/L (ref 3.5–5.3)
Sodium: 132 mmol/L — ABNORMAL LOW (ref 135–146)

## 2015-04-17 MED ORDER — FUROSEMIDE 40 MG PO TABS
40.0000 mg | ORAL_TABLET | Freq: Every day | ORAL | Status: DC
Start: 2015-04-17 — End: 2015-06-29

## 2015-04-17 NOTE — Patient Instructions (Addendum)
Medication Instructions:  Your physician has recommended you make the following change in your medication:  1) Stop Atenolol 2) Stop Cardizem 3) Decrease Furosemide to 40 mg daily   Labwork: Your physician recommends that you return for lab work today:BMP     Testing/Procedures: None ordered   Follow-Up: Your physician recommends that you schedule a follow-up appointment in: 2 weeks with Dr [name]   Any Other Special Instructions Will Be Listed Below (If Applicable).     If you need a refill on your cardiac medications before your next appointment, please call your pharmacy.

## 2015-04-17 NOTE — Progress Notes (Signed)
Electrophysiology Office Note   Date:  04/17/2015   ID:  Alexandra CarawayFlorence C Reyes, DOB 1926-03-18, MRN [id number]  PCP:  Oliver BarreJames [name], MD  Cardiologist:  Dr [name] Primary Electrophysiologist: Alexandra RangeJames Cephas Revard, MD    Chief Complaint  Patient presents with  . SVT     History of Present Illness: Alexandra Reyes is a 81 y.o. female who presents today for electrophysiology evaluation.  She is doing better with multaq.  No afib.  She is wearing support hose which is helping with her edema.  Denies CP, SOB, dizzyness, presyncope or syncope.  Has had some HRs in the 50s which made her 'nervous".   Past Medical History  Diagnosis Date  . PAT (paroxysmal atrial tachycardia) (HCC)   . Macular degeneration   . OA (osteoarthritis)   . Hiatal hernia   . GERD (gastroesophageal reflux disease)   . Cholelithiasis   . COPD (chronic obstructive pulmonary disease) (HCC)   . HTN (hypertension) 05/18/2011  . Hyperlipidemia 05/18/2011  . Osteoporosis 05/18/2011  . Anemia, iron deficiency 05/18/2011  . DDD (degenerative disc disease), lumbar 05/18/2011  . Pectus excavatum 05/18/2011  . Allergy   . Allergic rhinitis, cause unspecified 05/22/2011  . Alcohol dependence in remission (HCC) 05/22/2011  . Personal history of colonic polyps 10/28/2012  . First degree AV block   . SVT (supraventricular tachycardia) (HCC)   . Myocardial infarction [facility](HCC)    Past Surgical History  Procedure Laterality Date  . Tonsillectomy and adenoidectomy    . Breast lumpectomy    . [place] echocardiography  01/17/2003    EF 55-60%  . Cardiovascular stress test  03/08/2009    EF 84%  . Abdominal hysterectomy  1988  . Breast biopsy  1948     Current Outpatient Prescriptions  Medication Sig Dispense Refill  . ALPRAZolam (XANAX) 0.25 MG tablet Take 0.25 mg by mouth daily as needed for anxiety (tachycardia).    . Ascorbic Acid (VITAMIN C PO) Take 1 tablet by mouth daily.    Marland Kitchen. aspirin 81 MG tablet Take 81 mg by mouth daily.      . B  Complex Vitamins (VITAMIN B COMPLEX PO) Take 1 tablet by mouth daily.     . calcium carbonate (OS-CAL) 600 MG tablet Take 600 mg by mouth daily.    Marland Kitchen. diltiazem (CARDIZEM CD) 120 MG 24 hr capsule Take 120 mg by mouth daily.    Marland Kitchen. diltiazem (CARDIZEM) 30 MG tablet Take 1-2 tablets by mouth every 6 hours as needed for fast heart rates 30 tablet 1  . dronedarone (MULTAQ) 400 MG tablet Take 1 tablet (400 mg total) by mouth 2 (two) times daily with a meal. 180 tablet 3  . ferrous sulfate 325 (65 FE) MG tablet Take 325 mg by mouth daily.     . furosemide (LASIX) 40 MG tablet Take by mouth as directed. Take 40 mg by mouth in the morning and 20mg  by mouth in the evening    . Glucosamine HCl-MSM (GLUCOSAMINE-MSM PO) Take 2 tablets by mouth daily.     Marland Kitchen. ibuprofen (ADVIL,MOTRIN) 200 MG tablet Take 200 mg by mouth every 6 (six) hours as needed (pain).     . simvastatin (ZOCOR) 20 MG tablet TAKE 1 TABLET BY MOUTH DAILY AT BEDTIME (GENERIC FOR ZOCOR) 90 tablet 0  . tiotropium (SPIRIVA) 18 MCG inhalation capsule Place 18 mcg into inhaler and inhale every other day.     . triamcinolone cream (KENALOG) 0.1 % Apply 1 application  topically 2 (two) times daily. (Patient taking differently: Apply 1 application topically 2 (two) times daily as needed (for skin). ) 30 g 0  . vitamin E 200 UNIT capsule Take 200 Units by mouth daily.    Marland Kitchen amLODipine (NORVASC) 2.5 MG tablet Take 1 tablet (2.5 mg total) by mouth daily. (Patient not taking: Reported on 04/17/2015) 90 tablet 1  . atenolol (TENORMIN) 25 MG tablet Take 1 tablet (25 mg total) by mouth daily. (Patient not taking: Reported on 04/17/2015) 100 tablet PRN   No current facility-administered medications for this visit.    Allergies:   Lisinopril and Pneumovax 23   Social History:  The patient  reports that she quit smoking about 12 years ago. She does not have any smokeless tobacco history on file. She reports that she does not drink alcohol or use illicit drugs.    Family History:  The patient's family history includes Arthritis in her mother; Heart disease in her father; Heart failure in her mother; Hypertension in her father; Kidney failure in her father.    ROS:  Please see the history of present illness.   All other systems are reviewed and negative.    PHYSICAL EXAM: VS:  BP 130/62 mmHg  Pulse 67  Ht  (1.626 m)  Wt 154 lb (69.854 kg)  BMI 26.42 kg/m2 , BMI Body mass index is 26.42 kg/(m^2). GEN: elderly, in no acute distress HEENT: normal Neck: no JVD, carotid bruits, or masses Cardiac: RRR; no murmurs, rubs, or gallops,+1 edema , support hose in place Respiratory:  clear to auscultation bilaterally, normal work of breathing GI: soft, nontender, nondistended, + BS MS: no deformity or atrophy Skin: warm and dry  Neuro:  Strength and sensation are intact Psych: euthymic mood, full affect  EKG:  EKG reveals sinus rhythm with first degree AV block,  anteroseptal and inferior infarct pattern   Recent Labs: 12/05/2014: ALT 17; BUN 11; Creatinine, Ser 0.80; Potassium 4.3; Sodium 136    Lipid Panel     Component Value Date/Time   CHOL 158 12/05/2014 0925   TRIG 123.0 12/05/2014 0925   HDL 47.10 12/05/2014 0925   CHOLHDL 3 12/05/2014 0925   VLDL 24.6 12/05/2014 0925   LDLCALC 86 12/05/2014 0925   LDLDIRECT 129.2 05/15/2011 0925     Wt Readings from Last 3 Encounters:  04/17/15 154 lb (69.854 kg)  04/03/15 157 lb (71.215 kg)  03/15/15 155 lb 6.4 oz (70.489 kg)     ASSESSMENT AND PLAN:  1.  SVT Long RP,  Likely atach with first degree AV block though I cannot exclude a reentrant arrhythmia. Doing much better with multaq Continue multaq and take cardizem prn Stop cardizem CD and atenolol Amiodarone would be our next option if she fails multaq.  We may also consider ablation though given her advanced age, I would like to avoid if possible.   2. HTN Stable No change required today  3. Venous insufficiency Support  hose Reduce lasix to  daily (appears dry on exam) bmet today   Return to see me in 2 weeks   Signed, Alexandra Range, MD  04/17/2015 12:30 PM     [facility] [address] [place] [address] [phone] (office) [phone] (fax)

## 2015-04-19 DIAGNOSIS — F4329 Adjustment disorder with other symptoms: Secondary | ICD-10-CM | POA: Diagnosis not present

## 2015-04-21 ENCOUNTER — Telehealth: Payer: Self-pay | Admitting: Internal Medicine

## 2015-04-21 NOTE — Telephone Encounter (Signed)
Called Ms. Crill in follow up from questions from this morning.  States at 1:30 went back into tachycardia with rate of 160. Since she took the Cardizem at 9 AM and again at 10:45 she can't take another one for 6 hrs.  At 2:15 pulse now 76 with BP 137/72. States she feels better now.  Her question is that she was on Cardizem 120 mg until Dr. [name] stopped that on Monday 11/7 and wants to know if can just take 60 mg of Cardizem in the AM with her other medications. Advised that Dr. [name] is in hospital doing procedures and may not get back with us this afternoon.  Will forward this additional information to him. Advised if still has tachycardia in AM and the Cardizem has not resolved after the 2 doses 30 min apart can call the DOD on call.  She verbalizes understanding that may be Monday before she receives answer.

## 2015-04-21 NOTE — Telephone Encounter (Signed)
Spoke with pt. She reports she saw Dr. [name] on Monday and medications were changed.  Monday afternoon she had 30 minutes of tachycardia. Tues--3:30 PM had 45 minutes of tachycardia. Wednesday--AM at [employer] study had 60 minutes of tachycardia.  Went home and had another episode.  Total for the day was 2 and a half hours.                         Took total of 60 mg cardizem on Wed Thursday-11:30 AM-30 minutes of tachycardia.   Friday--At 8 AM heart rate was 80-90.  Took Cardizem 30 mg at 9 AM.  Ate breakfast at 10 AM and took Multaq at that time.  At 10:45 AM she had 20 minutes of tachycardia and took 30 mg Cardizem.  Pt concerned about frequent episodes of tachycardia and is asking if medications should be changed. Will forward to Dr. [name] for recommendations.

## 2015-04-21 NOTE — Telephone Encounter (Signed)
New message      Talk to the nurse----pt states she is in-and-out of tachycardia

## 2015-04-24 NOTE — Telephone Encounter (Addendum)
Discussed with Dr [name] and he says she can try the Cardizem 60mg .  She has tried and is taking the Multaq bid and then added the Cardizem 120mg  but it last for about an hour both Sat and Sun and then her heart was racing. Dr [name] wants to give the medication a chance to get back into her system before changing again.  I will call her on Wed and see how things are going.  Dr [name] said she could try Amiodarone 200mg  bid but would need to stop the Multaq and the daily Cardizem if decides to do theis route.

## 2015-04-24 NOTE — Telephone Encounter (Signed)
Follow up     [facility]

## 2015-04-25 ENCOUNTER — Other Ambulatory Visit: Payer: Self-pay | Admitting: Cardiology

## 2015-04-25 DIAGNOSIS — F419 Anxiety disorder, unspecified: Secondary | ICD-10-CM

## 2015-04-27 ENCOUNTER — Other Ambulatory Visit: Payer: Self-pay | Admitting: Internal Medicine

## 2015-04-27 NOTE — Telephone Encounter (Signed)
New message     *STAT* If patient is at the pharmacy, call can be transferred to refill team.   1. Which medications need to be refilled? (please list name of each medication and dose if known) xanax 0.25 mg   2. Which pharmacy/location (including street and city if local pharmacy) is medication to be sent to? [facility] [address] [facility] [address]  [phone]   3. Do they need a 30 day or 90 day supply? 30 days

## 2015-04-27 NOTE — Telephone Encounter (Signed)
Spoke with patient and she is doing better little by little.  She says yesterday afternoon she had an episode that lasted 10-15 min.  She is wondering if she is having afib some.  She is going to discuss her concerns on Mon with Dr [name].  Was appreciative of my call.

## 2015-05-01 ENCOUNTER — Other Ambulatory Visit: Payer: Self-pay

## 2015-05-01 ENCOUNTER — Other Ambulatory Visit: Payer: Self-pay | Admitting: Cardiology

## 2015-05-01 ENCOUNTER — Encounter: Payer: Self-pay | Admitting: Internal Medicine

## 2015-05-01 ENCOUNTER — Ambulatory Visit (INDEPENDENT_AMBULATORY_CARE_PROVIDER_SITE_OTHER): Payer: Medicare Other | Admitting: Internal Medicine

## 2015-05-01 VITALS — BP 134/70 | HR 80 | Ht 64.0 in | Wt 156.2 lb

## 2015-05-01 DIAGNOSIS — I70213 Atherosclerosis of native arteries of extremities with intermittent claudication, bilateral legs: Secondary | ICD-10-CM

## 2015-05-01 DIAGNOSIS — I471 Supraventricular tachycardia: Secondary | ICD-10-CM | POA: Diagnosis not present

## 2015-05-01 DIAGNOSIS — I10 Essential (primary) hypertension: Secondary | ICD-10-CM

## 2015-05-01 MED ORDER — AMIODARONE HCL 200 MG PO TABS
ORAL_TABLET | ORAL | Status: DC
Start: 2015-05-01 — End: 2015-05-26

## 2015-05-01 NOTE — Progress Notes (Signed)
Electrophysiology Office Note   Date:  05/01/2015   ID:  Alexandra CarawayFlorence C Reyes, DOB 16-May-1926, MRN [id number]  PCP:  Oliver BarreJames [name], MD  Cardiologist:  Dr [name] Primary Electrophysiologist: Hillis RangeJames Jirah Rider, MD    Chief Complaint  Patient presents with  . PSVT     History of Present Illness: Alexandra Reyes is a 81 y.o. female who presents today for electrophysiology evaluation.  She has had several additional episodes of atrial tachycardia.  Denies CP, SOB, dizzyness, presyncope or syncope.  Has had some HRs in the 50s which made her 'nervous".   Past Medical History  Diagnosis Date  . PAT (paroxysmal atrial tachycardia) (HCC)   . Macular degeneration   . OA (osteoarthritis)   . Hiatal hernia   . GERD (gastroesophageal reflux disease)   . Cholelithiasis   . COPD (chronic obstructive pulmonary disease) (HCC)   . HTN (hypertension) 05/18/2011  . Hyperlipidemia 05/18/2011  . Osteoporosis 05/18/2011  . Anemia, iron deficiency 05/18/2011  . DDD (degenerative disc disease), lumbar 05/18/2011  . Pectus excavatum 05/18/2011  . Allergy   . Allergic rhinitis, cause unspecified 05/22/2011  . Alcohol dependence in remission (HCC) 05/22/2011  . Personal history of colonic polyps 10/28/2012  . First degree AV block   . SVT (supraventricular tachycardia) (HCC)   . Myocardial infarction [facility](HCC)    Past Surgical History  Procedure Laterality Date  . Tonsillectomy and adenoidectomy    . Breast lumpectomy    . [place] echocardiography  01/17/2003    EF 55-60%  . Cardiovascular stress test  03/08/2009    EF 84%  . Abdominal hysterectomy  1988  . Breast biopsy  1948     Current Outpatient Prescriptions  Medication Sig Dispense Refill  . ALPRAZolam (XANAX) 0.25 MG tablet Take 0.25 mg by mouth at bedtime as needed for anxiety.    Marland Kitchen. amLODipine (NORVASC) 2.5 MG tablet Take 1 tablet (2.5 mg total) by mouth daily. 90 tablet 1  . Ascorbic Acid (VITAMIN C PO) Take 1 tablet by mouth daily.    Marland Kitchen. aspirin 81  MG tablet Take 81 mg by mouth daily.      . B Complex Vitamins (VITAMIN B COMPLEX PO) Take 1 tablet by mouth daily.     . calcium carbonate (OS-CAL) 600 MG tablet Take 600 mg by mouth daily.    Marland Kitchen. diltiazem (CARDIZEM CD) 120 MG 24 hr capsule Take 1 capsule by mouth daily.    Marland Kitchen. diltiazem (CARDIZEM) 30 MG tablet Take 1-2 tablets by mouth every 6 hours as needed for fast heart rates 30 tablet 1  . ferrous sulfate 325 (65 FE) MG tablet Take 325 mg by mouth daily.     . furosemide (LASIX) 40 MG tablet Take 1 tablet (40 mg total) by mouth daily. 90 tablet 3  . Glucosamine HCl-MSM (GLUCOSAMINE-MSM PO) Take 2 tablets by mouth daily.     Marland Kitchen. ibuprofen (ADVIL,MOTRIN) 200 MG tablet Take 200 mg by mouth every 6 (six) hours as needed (pain).     . simvastatin (ZOCOR) 20 MG tablet TAKE 1 TABLET BY MOUTH DAILY AT BEDTIME (GENERIC FOR ZOCOR) 90 tablet 0  . tiotropium (SPIRIVA) 18 MCG inhalation capsule Place 18 mcg into inhaler and inhale every other day.     . triamcinolone cream (KENALOG) 0.1 % APPLY TOPICALLY TO AFFECTED AREA(S) TWICE DAILY. 30 g 1  . vitamin E 200 UNIT capsule Take 200 Units by mouth daily.    Marland Kitchen. amiodarone (PACERONE) 200 MG  tablet Take one tablet by mouth twice daily for 7 days then decrease to one tablet by mouth daily 90 tablet 3   No current facility-administered medications for this visit.    Allergies:   Lisinopril and Pneumovax 23   Social History:  The patient  reports that she quit smoking about 12 years ago. She does not have any smokeless tobacco history on file. She reports that she does not drink alcohol or use illicit drugs.   Family History:  The patient's family history includes Arthritis in her mother; Heart disease in her father; Heart failure in her mother; Hypertension in her father; Kidney failure in her father.    ROS:  Please see the history of present illness.   All other systems are reviewed and negative.    PHYSICAL EXAM: VS:  BP 134/70 mmHg  Pulse 80  Ht 5'  4" (1.626 m)  Wt 156 lb 3.2 oz (70.852 kg)  BMI 26.80 kg/m2 , BMI Body mass index is 26.8 kg/(m^2). GEN: elderly, in no acute distress HEENT: normal Neck: no JVD, carotid bruits, or masses Cardiac: RRR; no murmurs, rubs, or gallops,+1 edema , support hose in place Respiratory:  clear to auscultation bilaterally, normal work of breathing GI: soft, nontender, nondistended, + BS MS: no deformity or atrophy Skin: warm and dry  Neuro:  Strength and sensation are intact Psych: euthymic mood, full affect  EKG:  EKG reveals sinus rhythm with first degree AV block and PACs,  anteroseptal and inferior infarct pattern   Recent Labs: 12/05/2014: ALT 17 04/17/2015: BUN 14; Creat 0.85; Potassium 4.2; Sodium 132*    Lipid Panel     Component Value Date/Time   CHOL 158 12/05/2014 0925   TRIG 123.0 12/05/2014 0925   HDL 47.10 12/05/2014 0925   CHOLHDL 3 12/05/2014 0925   VLDL 24.6 12/05/2014 0925   LDLCALC 86 12/05/2014 0925   LDLDIRECT 129.2 05/15/2011 0925     Wt Readings from Last 3 Encounters:  05/01/15 156 lb 3.2 oz (70.852 kg)  04/17/15 154 lb (69.854 kg)  04/03/15 157 lb (71.215 kg)     ASSESSMENT AND PLAN:  1.  SVT Long RP,  Likely atach with first degree AV block though I cannot exclude a reentrant arrhythmia.  Continues to have episodes on multaq.  Will therefore stop multaq and start amiodarone  BID x7 days then  daily thereafter.  Will try to stop cardizem once arrhythmia is controlled given advanced conduction system disease.  Given her advanced age, I would like to avoid ablation if possible.   2. HTN Stable No change required today  3. Venous insufficiency Support hose   Return to see me in 2 weeks Follow-up with Dr Patty Sermons as scheduled  Signed, Hillis Range, MD  05/01/2015 2:31 PM     [facility] [address] [place] [address] [phone] (office) [phone] (fax)

## 2015-05-01 NOTE — Patient Instructions (Signed)
Medication Instructions:  Your physician has recommended you make the following change in your medication:  1) Stop Multaq 20 Start Amiodarone 200mg --- take one tablet by mouth twice daily for 7 days then decrease to 200mg  daily    Labwork: None ordered   Testing/Procedures: None ordered   Follow-Up: Your physician recommends that you schedule a follow-up appointment in: 2 weeks with Dr [name]   Any Other Special Instructions Will Be Listed Below (If Applicable).     If you need a refill on your cardiac medications before your next appointment, please call your pharmacy.

## 2015-05-10 DIAGNOSIS — H35371 Puckering of macula, right eye: Secondary | ICD-10-CM | POA: Diagnosis not present

## 2015-05-10 DIAGNOSIS — H353223 Exudative age-related macular degeneration, left eye, with inactive scar: Secondary | ICD-10-CM | POA: Diagnosis not present

## 2015-05-10 DIAGNOSIS — H43813 Vitreous degeneration, bilateral: Secondary | ICD-10-CM | POA: Diagnosis not present

## 2015-05-10 DIAGNOSIS — H353211 Exudative age-related macular degeneration, right eye, with active choroidal neovascularization: Secondary | ICD-10-CM | POA: Diagnosis not present

## 2015-05-15 ENCOUNTER — Encounter: Payer: Self-pay | Admitting: Cardiovascular Disease

## 2015-05-15 ENCOUNTER — Ambulatory Visit (INDEPENDENT_AMBULATORY_CARE_PROVIDER_SITE_OTHER): Payer: Medicare Other | Admitting: Internal Medicine

## 2015-05-15 ENCOUNTER — Encounter: Payer: Self-pay | Admitting: Internal Medicine

## 2015-05-15 VITALS — BP 138/80 | HR 73 | Ht 64.0 in | Wt 154.6 lb

## 2015-05-15 DIAGNOSIS — I70213 Atherosclerosis of native arteries of extremities with intermittent claudication, bilateral legs: Secondary | ICD-10-CM

## 2015-05-15 DIAGNOSIS — Z79899 Other long term (current) drug therapy: Secondary | ICD-10-CM | POA: Diagnosis not present

## 2015-05-15 DIAGNOSIS — I471 Supraventricular tachycardia: Secondary | ICD-10-CM

## 2015-05-15 NOTE — Patient Instructions (Signed)
Medication Instructions:  Increase Amiodarone to 200mg  twice daily for the next 3 days, then back down to 200 mg daily   Labwork: None ordered   Testing/Procedures: None ordered   Follow-Up: Your physician recommends that you schedule a follow-up appointment in:  4 weeks with Dr [name]                    Any Other Special Instructions Will Be Listed Below (If Applicable).     If you need a refill on your cardiac medications before your next appointment, please call your pharmacy.

## 2015-05-15 NOTE — Progress Notes (Signed)
Electrophysiology Office Note   Date:  05/15/2015   ID:  Alexandra Reyes 02/06/1926, MRN [id number]  PCP:  Oliver Barre, MD  Cardiologist:  Dr Patty Sermons Primary Electrophysiologist: Hillis Range, MD    Chief Complaint  Patient presents with  . SVT  . PSVT     History of Present Illness: Alexandra Reyes is a 81 y.o. female who presents today for electrophysiology evaluation.   Her atach is much better.  She has had only 3 episodes since I saw her last.  She is tolerating this without difficulty.  Denies CP, SOB, dizzyness, presyncope or syncope.     Past Medical History  Diagnosis Date  . PAT (paroxysmal atrial tachycardia) (HCC)   . Macular degeneration   . OA (osteoarthritis)   . Hiatal hernia   . GERD (gastroesophageal reflux disease)   . Cholelithiasis   . COPD (chronic obstructive pulmonary disease) (HCC)   . HTN (hypertension) 05/18/2011  . Hyperlipidemia 05/18/2011  . Osteoporosis 05/18/2011  . Anemia, iron deficiency 05/18/2011  . DDD (degenerative disc disease), lumbar 05/18/2011  . Pectus excavatum 05/18/2011  . Allergy   . Allergic rhinitis, cause unspecified 05/22/2011  . Alcohol dependence in remission (HCC) 05/22/2011  . Personal history of colonic polyps 10/28/2012  . First degree AV block   . SVT (supraventricular tachycardia) (HCC)   . Myocardial infarction [facility])    Past Surgical History  Procedure Laterality Date  . Tonsillectomy and adenoidectomy    . Breast lumpectomy    . US echocardiography  01/17/2003    EF 55-60%  . Cardiovascular stress test  03/08/2009    EF 84%  . Abdominal hysterectomy  1988  . Breast biopsy  1948     Current Outpatient Prescriptions  Medication Sig Dispense Refill  . ALPRAZolam (XANAX) 0.25 MG tablet Take 0.25 mg by mouth at bedtime as needed for anxiety.    Marland Kitchen amiodarone (PACERONE) 200 MG tablet Take one tablet by mouth twice daily for 7 days then decrease to one tablet by mouth daily 90 tablet 3  . amLODipine  (NORVASC) 2.5 MG tablet Take 1 tablet (2.5 mg total) by mouth daily. 90 tablet 1  . Ascorbic Acid (VITAMIN C PO) Take 1 tablet by mouth daily.    Marland Kitchen aspirin 81 MG tablet Take 81 mg by mouth daily.      . B Complex Vitamins (VITAMIN B COMPLEX PO) Take 1 tablet by mouth daily.     . calcium carbonate (OS-CAL) 600 MG tablet Take 600 mg by mouth daily.    Marland Kitchen diltiazem (CARDIZEM CD) 120 MG 24 hr capsule Take 1 capsule by mouth daily.    Marland Kitchen diltiazem (CARDIZEM) 30 MG tablet Take 1-2 tablets by mouth every 6 hours as needed for fast heart rates 30 tablet 1  . ferrous sulfate 325 (65 FE) MG tablet Take 325 mg by mouth daily.     . furosemide (LASIX) 40 MG tablet Take 1 tablet (40 mg total) by mouth daily. 90 tablet 3  . Glucosamine HCl-MSM (GLUCOSAMINE-MSM PO) Take 2 tablets by mouth daily.     Marland Kitchen ibuprofen (ADVIL,MOTRIN) 200 MG tablet Take 200 mg by mouth every 6 (six) hours as needed (pain).     . simvastatin (ZOCOR) 20 MG tablet TAKE 1 TABLET BY MOUTH DAILY AT BEDTIME (GENERIC FOR ZOCOR) 90 tablet 1  . tiotropium (SPIRIVA) 18 MCG inhalation capsule Place 18 mcg into inhaler and inhale every other day.     Marland Kitchen  triamcinolone cream (KENALOG) 0.1 % APPLY TOPICALLY TO AFFECTED AREA(S) TWICE DAILY. 30 g 1  . vitamin E 200 UNIT capsule Take 200 Units by mouth daily.     No current facility-administered medications for this visit.    Allergies:   Lisinopril and Pneumovax 23   Social History:  The patient  reports that she quit smoking about 12 years ago. She does not have any smokeless tobacco history on file. She reports that she does not drink alcohol or use illicit drugs.   Family History:  The patient's family history includes Arthritis in her mother; Heart disease in her father; Heart failure in her mother; Hypertension in her father; Kidney failure in her father.    ROS:  Please see the history of present illness.   All other systems are reviewed and negative.    PHYSICAL EXAM: VS:  BP 138/80 mmHg   Pulse 73  Ht 5\' 4"  (1.626 m)  Wt 154 lb 9.6 oz (70.126 kg)  BMI 26.52 kg/m2 , BMI Body mass index is 26.52 kg/(m^2). GEN: elderly, in no acute distress HEENT: normal Neck: no JVD, carotid bruits, or masses Cardiac: RRR; no murmurs, rubs, or gallops,+1 edema , support hose in place Respiratory:  clear to auscultation bilaterally, normal work of breathing GI: soft, nontender, nondistended, + BS MS: no deformity or atrophy Skin: warm and dry  Neuro:  Strength and sensation are intact Psych: euthymic mood, full affect  EKG:  EKG reveals sinus rhythm with first degree AV block,  anteroseptal and inferior infarct pattern   Recent Labs: 12/05/2014: ALT 17 04/17/2015: BUN 14; Creat 0.85; Potassium 4.2; Sodium 132*    Lipid Panel     Component Value Date/Time   CHOL 158 12/05/2014 0925   TRIG 123.0 12/05/2014 0925   HDL 47.10 12/05/2014 0925   CHOLHDL 3 12/05/2014 0925   VLDL 24.6 12/05/2014 0925   LDLCALC 86 12/05/2014 0925   LDLDIRECT 129.2 05/15/2011 0925     Wt Readings from Last 3 Encounters:  05/15/15 154 lb 9.6 oz (70.126 kg)  05/01/15 156 lb 3.2 oz (70.852 kg)  04/17/15 154 lb (69.854 kg)     ASSESSMENT AND PLAN:  1.  SVT Long RP,  Likely atach with first degree AV block though I cannot exclude a reentrant arrhythmia.  Much improved with amiodarone.  Will increase amiodarone to 200mg  BID x 3 days then return to 200mg  daily.  I am optimistic that we will be able to avoid ablation which carries risk given her age.  Will try to stop cardizem once arrhythmia is controlled given advanced conduction system disease  2. HTN Stable No change required today  3. Venous insufficiency Support hose   Return to see me in 4 weeks Follow-up with Dr [name] as scheduled  Signed, Hillis RangeJames Arien Benincasa, MD  05/15/2015 8:44 PM     [facility] [address] [place] [address] [phone] (office) [phone] (fax)

## 2015-05-17 ENCOUNTER — Other Ambulatory Visit: Payer: Self-pay | Admitting: *Deleted

## 2015-05-17 ENCOUNTER — Telehealth: Payer: Self-pay | Admitting: Cardiology

## 2015-05-17 DIAGNOSIS — F4329 Adjustment disorder with other symptoms: Secondary | ICD-10-CM | POA: Diagnosis not present

## 2015-05-17 MED ORDER — SIMVASTATIN 20 MG PO TABS: 20.0000 mg | ORAL_TABLET | Freq: Every day | ORAL | Status: DC

## 2015-05-17 NOTE — Telephone Encounter (Signed)
New message     Calling with a possible drug interaction between diltiazem and simavastatin (new drug)

## 2015-05-17 NOTE — Telephone Encounter (Signed)
New message       *STAT* If patient is at the pharmacy, call can be transferred to refill team.   1. Which medications need to be refilled? (please list name of each medication and dose if known) simvastatin 20mg  2. Which pharmacy/location (including street and city if local pharmacy) is medication to be sent to? [facility]  3. Do they need a 30 day or 90 day supply? 90 day

## 2015-05-17 NOTE — Telephone Encounter (Signed)
Will forward to  Dr. Brackbill for review 

## 2015-05-18 MED ORDER — SIMVASTATIN 10 MG PO TABS: 10.0000 mg | ORAL_TABLET | Freq: Every day | ORAL | Status: DC

## 2015-05-18 NOTE — Telephone Encounter (Signed)
Reduce simvastatin to 10 mg daily

## 2015-05-18 NOTE — Telephone Encounter (Signed)
rx sent to pharmacy

## 2015-05-19 ENCOUNTER — Encounter: Payer: Self-pay | Admitting: Cardiology

## 2015-05-19 DIAGNOSIS — Z23 Encounter for immunization: Secondary | ICD-10-CM | POA: Diagnosis not present

## 2015-05-19 NOTE — Telephone Encounter (Signed)
Patient aware of change

## 2015-05-19 NOTE — Telephone Encounter (Signed)
This encounter was created in error - please disregard.

## 2015-05-19 NOTE — Telephone Encounter (Signed)
Left message to call back  

## 2015-05-19 NOTE — Telephone Encounter (Signed)
New message ° ° ° ° ° °Returning a call to Alexandra Reyes °

## 2015-05-24 ENCOUNTER — Other Ambulatory Visit (INDEPENDENT_AMBULATORY_CARE_PROVIDER_SITE_OTHER): Payer: Medicare Other | Admitting: *Deleted

## 2015-05-24 DIAGNOSIS — E785 Hyperlipidemia, unspecified: Secondary | ICD-10-CM | POA: Diagnosis not present

## 2015-05-24 DIAGNOSIS — Z79899 Other long term (current) drug therapy: Secondary | ICD-10-CM | POA: Diagnosis not present

## 2015-05-24 LAB — LIPID PANEL
CHOL/HDL RATIO: 2 ratio (ref <=5.0)
Cholesterol: 167 mg/dL (ref 125–200)
HDL: 84 mg/dL (ref >=46)
LDL Cholesterol: 69 mg/dL (ref <130)
Triglycerides: 71 mg/dL (ref <150)
VLDL: 14 mg/dL (ref <30)

## 2015-05-24 LAB — BASIC METABOLIC PANEL
BUN: 15 mg/dL (ref 7–25)
CALCIUM: 8.9 mg/dL (ref 8.6–10.4)
CHLORIDE: 102 mmol/L (ref 98–110)
CO2: 24 mmol/L (ref 20–31)
Creat: 0.88 mg/dL (ref 0.60–0.88)
Glucose, Bld: 81 mg/dL (ref 65–99)
POTASSIUM: 3.9 mmol/L (ref 3.5–5.3)
SODIUM: 135 mmol/L (ref 135–146)

## 2015-05-24 LAB — HEPATIC FUNCTION PANEL
ALBUMIN: 3.7 g/dL (ref 3.6–5.1)
ALT: 17 U/L (ref 6–29)
AST: 19 U/L (ref 10–35)
Alkaline Phosphatase: 62 U/L (ref 33–130)
BILIRUBIN TOTAL: 0.5 mg/dL (ref 0.2–1.2)
Bilirubin, Direct: 0.1 mg/dL (ref <=0.2)
Indirect Bilirubin: 0.4 mg/dL (ref 0.2–1.2)
Total Protein: 6.1 g/dL (ref 6.1–8.1)

## 2015-05-24 LAB — T4, FREE: Free T4: 1.26 ng/dL (ref 0.80–1.80)

## 2015-05-24 LAB — TSH: TSH: 3.969 u[IU]/mL (ref 0.350–4.500)

## 2015-05-24 NOTE — Addendum Note (Signed)
Addended by: Tonita PhoenixBOWDEN, Calypso Hagarty K on: 05/24/2015 08:30 AM   Modules accepted: Orders

## 2015-05-24 NOTE — Addendum Note (Signed)
Addended by: BOWDEN, ROBIN K on: 05/24/2015 08:30 AM   Modules accepted: Orders  

## 2015-05-24 NOTE — Addendum Note (Signed)
Addended by: Tonita PhoenixBOWDEN, ROBIN K on: 05/24/2015 08:31 AM   Modules accepted: Orders

## 2015-05-25 ENCOUNTER — Ambulatory Visit: Payer: Medicare Other | Admitting: Cardiology

## 2015-05-25 NOTE — Progress Notes (Signed)
Quick Note:  Please make copy of labs for patient visit. ______ 

## 2015-05-26 ENCOUNTER — Encounter: Payer: Self-pay | Admitting: Cardiology

## 2015-05-26 ENCOUNTER — Ambulatory Visit (INDEPENDENT_AMBULATORY_CARE_PROVIDER_SITE_OTHER): Payer: Medicare Other | Admitting: Cardiology

## 2015-05-26 VITALS — BP 130/74 | HR 64 | Ht 63.0 in | Wt 155.0 lb

## 2015-05-26 DIAGNOSIS — I471 Supraventricular tachycardia: Secondary | ICD-10-CM

## 2015-05-26 DIAGNOSIS — I70213 Atherosclerosis of native arteries of extremities with intermittent claudication, bilateral legs: Secondary | ICD-10-CM

## 2015-05-26 NOTE — Patient Instructions (Signed)
Medication Instructions:  Your physician recommends that you continue on your current medications as directed. Please refer to the Current Medication list given to you today.  Labwork: NONE  Testing/Procedures: NONE  Follow-Up: Your physician wants you to follow-up in: 6 MONTH OV WITH DR SKAINS You will receive a reminder letter in the mail two months in advance. If you don't receive a letter, please call our office to schedule the follow-up appointment.  If you need a refill on your cardiac medications before your next appointment, please call your pharmacy.  

## 2015-05-26 NOTE — Progress Notes (Signed)
Cardiology Office Note   Date:  05/26/2015   ID:  [name], [name] [birth year], MRN [id number]  PCP:  Oliver Barre, MD  Cardiologist: Alexandra Clement MD  No chief complaint on file.     History of Present Illness: Alexandra Reyes is a 81 y.o. female who presents for a six-month follow-up office visit.  This pleasant 81 year old widowed woman is seen for a scheduled 6 month followup office visit.  She has been a widow for 21 years.  She has a retired Engineer, civil (consulting).  She has a past history of diastolic dysfunction with dyspnea and a history of paroxysmal supraventricular tachycardia. She does not have any history of ischemic heart disease. She had a normal treadmill Cardiolite stress test in 2010. She had another Myoview stress test on 06/02/14 which showed no evidence of ischemia and showed normal ejection fraction. She has not had cardiac catheterization. Echocardiogram in 2004 showed diastolic dysfunction and trace mitral regurgitation. She has a history of significant macular degeneration and is followed by Dr. Allyne Gee. She has been having more shortness of breath recently. She had a chest x-ray on 04/28/13 which showed hyperinflation. She has had pulmonary function studies through Dr. Raphael Gibney office. Initially she was felt to have possible COPD. Subsequently she was told that she did not have COPD after she had her pulmonary function studies. She uses albuterol infrequently. She is taking Spiriva every 48 hours instead of every day. Her last echocardiogram on 06/16/13 showed normal left ventricular systolic function with an ejection fraction of 60-65% and there was mild diastolic dysfunction and there were no valve abnormalities. He has a long history of paroxysmal supraventricular tachycardia. Since we last saw her 6 months ago she has had increased episodes of paroxysmal tachycardia.  She saw Dr. Johney Frame who diagnosed probable atrial tachycardia and placed her on amiodarone.  She has had a  good response to amiodarone.  Dr. Johney Frame felt that at her advanced age she would do better if we could avoid an ablation procedure if at all possible.  Dr. Johney Frame will be checking her again in several weeks.  We did lab work last week which shows normal thyroid function and normal liver function.   Past Medical History  Diagnosis Date  . PAT (paroxysmal atrial tachycardia) (HCC)   . Macular degeneration   . OA (osteoarthritis)   . Hiatal hernia   . GERD (gastroesophageal reflux disease)   . Cholelithiasis   . COPD (chronic obstructive pulmonary disease) (HCC)   . HTN (hypertension) 05/18/2011  . Hyperlipidemia 05/18/2011  . Osteoporosis 05/18/2011  . Anemia, iron deficiency 05/18/2011  . DDD (degenerative disc disease), lumbar 05/18/2011  . Pectus excavatum 05/18/2011  . Allergy   . Allergic rhinitis, cause unspecified 05/22/2011  . Alcohol dependence in remission (HCC) 05/22/2011  . Personal history of colonic polyps 10/28/2012  . First degree AV block   . SVT (supraventricular tachycardia) (HCC)   . Myocardial infarction [facility])     Past Surgical History  Procedure Laterality Date  . Tonsillectomy and adenoidectomy    . Breast lumpectomy    . US echocardiography  01/17/2003    EF 55-60%  . Cardiovascular stress test  03/08/2009    EF 84%  . Abdominal hysterectomy  1988  . Breast biopsy  1948     Current Outpatient Prescriptions  Medication Sig Dispense Refill  . ALPRAZolam (XANAX) 0.25 MG tablet Take 0.25 mg by mouth at bedtime as needed for anxiety.    Marland Kitchen  amiodarone (PACERONE) 200 MG tablet Take 200 mg by mouth daily.    Marland Kitchen amLODipine (NORVASC) 2.5 MG tablet Take 1 tablet (2.5 mg total) by mouth daily. 90 tablet 1  . Ascorbic Acid (VITAMIN C PO) Take 1 tablet by mouth daily.    Marland Kitchen aspirin 81 MG tablet Take 81 mg by mouth daily.      . B Complex Vitamins (VITAMIN B COMPLEX PO) Take 1 tablet by mouth daily.     . calcium carbonate (OS-CAL) 600 MG tablet Take 600 mg by mouth daily.     Marland Kitchen diltiazem (CARDIZEM CD) 120 MG 24 hr capsule Take 1 capsule by mouth daily.    Marland Kitchen diltiazem (CARDIZEM) 30 MG tablet Take 1-2 tablets by mouth every 6 hours as needed for fast heart rates 30 tablet 1  . ferrous sulfate 325 (65 FE) MG tablet Take 325 mg by mouth daily.     . furosemide (LASIX) 40 MG tablet Take 1 tablet (40 mg total) by mouth daily. 90 tablet 3  . Glucosamine HCl-MSM (GLUCOSAMINE-MSM PO) Take 2 tablets by mouth daily.     Marland Kitchen ibuprofen (ADVIL,MOTRIN) 200 MG tablet Take 200 mg by mouth every 6 (six) hours as needed (pain).     . simvastatin (ZOCOR) 10 MG tablet Take 1 tablet (10 mg total) by mouth daily at 6 PM. 90 tablet 3  . tiotropium (SPIRIVA) 18 MCG inhalation capsule Place 18 mcg into inhaler and inhale every other day.     . triamcinolone cream (KENALOG) 0.1 % APPLY TOPICALLY TO AFFECTED AREA(S) TWICE DAILY. 30 g 1  . vitamin E 200 UNIT capsule Take 200 Units by mouth daily.     No current facility-administered medications for this visit.    Allergies:   Lisinopril and Pneumovax 23    Social History:  The patient  reports that she quit smoking about 12 years ago. She does not have any smokeless tobacco history on file. She reports that she does not drink alcohol or use illicit drugs.   Family History:  The patient's family history includes Arthritis in her mother; Heart disease in her father; Heart failure in her mother; Hypertension in her father; Kidney failure in her father.    ROS:  Please see the history of present illness.   Otherwise, review of systems are positive for none.   All other systems are reviewed and negative.    PHYSICAL EXAM: VS:  BP 130/74 mmHg  Pulse 64  Ht  (1.6 m)  Wt 155 lb (70.308 kg)  BMI 27.46 kg/m2 , BMI Body mass index is 27.46 kg/(m^2). GEN: Well nourished, well developed, in no acute distress HEENT: normal Neck: no JVD, carotid bruits, or masses Cardiac: RRR; no murmurs, rubs, or gallops, there is mild chronic lower  extremity edema and she wears support hose Respiratory:  clear to auscultation bilaterally, normal work of breathing GI: soft, nontender, nondistended, + BS MS: no deformity or atrophy Skin: warm and dry, no rash Neuro:  Strength and sensation are intact Psych: euthymic mood, full affect   EKG:  EKG is ordered today. The ekg ordered today demonstrates normal sinus rhythm at 67 bpm.  First-degree AV block with PR interval 232 ms.   Recent Labs: 05/24/2015: ALT 17; BUN 15; Creat 0.88; Potassium 3.9; Sodium 135; TSH 3.969    Lipid Panel    Component Value Date/Time   CHOL 167 05/24/2015 0831   TRIG 71 05/24/2015 0831   HDL 84 05/24/2015 0831  CHOLHDL 2.0 05/24/2015 0831   VLDL 14 05/24/2015 0831   LDLCALC 69 05/24/2015 0831   LDLDIRECT 129.2 05/15/2011 0925      Wt Readings from Last 3 Encounters:  05/26/15 155 lb (70.308 kg)  05/15/15 154 lb 9.6 oz (70.126 kg)  05/01/15 156 lb 3.2 oz (70.852 kg)         ASSESSMENT AND PLAN:  1. SVT Long RP, Likely atach with first degree AV block though I cannot exclude a reentrant arrhythmia. Much improved with amiodarone.  Will try to stop cardizem once arrhythmia is controlled given advanced conduction system disease  2. HTN Stable No change required today  3. Venous insufficiency Support hose   Current medicines are reviewed at length with the patient today.  The patient does not have concerns regarding medicines.  The following changes have been made:  no change  Labs/ tests ordered today include:   Orders Placed This Encounter  Procedures  . EKG 12-Lead    Disposition: At her request we gave her a handicapped parking pass today.  She has difficulty with exertional dyspnea.  She will continue her same medication.  She will continue to be followed by Dr. Johney Reyes and she will also return after my retirement to see Dr. Anne FuSkains in 6 months for office visit  Signed, Alexandra Clementhomas Lenola Lockner MD 05/26/2015 12:55 PM    [facility] [address], [place]  [address] Phone: [phone]; Fax: [phone]

## 2015-05-30 ENCOUNTER — Telehealth: Payer: Self-pay | Admitting: Internal Medicine

## 2015-05-30 NOTE — Telephone Encounter (Signed)
New message     Pt is in tachycardia.  She was in tachycardia over the weekend sat and sun.  Monday no tachycardia. Please advise

## 2015-05-30 NOTE — Telephone Encounter (Signed)
She had this over the weekend and yesterday and today okay until this afternoon.  HR 150.  It stopped in 35-40 min.  She took a Cardizem 30 mg and it subsided.  She is currently taking Amiodarone to 200 mg daily.  I will discuss with Dr [name] and call patient back

## 2015-06-02 NOTE — Telephone Encounter (Signed)
Spoke with patient and she has not had any episodes since Tues but that one was very small.  She has been taking her Xanax and this has helped.  She at times thinks its her that makes her HR elevate.  I let her know I would discuss with Dr [name] next week and be in touch.  She was very appreciative of my call

## 2015-06-05 ENCOUNTER — Other Ambulatory Visit: Payer: Self-pay | Admitting: Internal Medicine

## 2015-06-05 ENCOUNTER — Other Ambulatory Visit: Payer: Self-pay | Admitting: Cardiology

## 2015-06-08 NOTE — Telephone Encounter (Signed)
Discussed with Dr [name] and he says she can increase her Amiodarone for 7 days to 200mg  twice a day.  I spoke with patient and she is aware of plan and I will speak with her next week

## 2015-06-14 DIAGNOSIS — F4329 Adjustment disorder with other symptoms: Secondary | ICD-10-CM | POA: Diagnosis not present

## 2015-06-19 ENCOUNTER — Ambulatory Visit: Payer: Medicare Other | Admitting: Internal Medicine

## 2015-06-28 ENCOUNTER — Other Ambulatory Visit: Payer: Self-pay

## 2015-06-28 NOTE — Telephone Encounter (Signed)
Medication Detail      Disp Refills Start End     furosemide (LASIX) 40 MG tablet 90 tablet 3 04/17/2015     Sig - Route: Take 1 tablet (40 mg total) by mouth daily. - Oral    Class: No Print     Pharmacy    [facility] [phone] [facility] - [place], [place] - [address]  No change of pharmacy requested by patient.

## 2015-06-29 ENCOUNTER — Other Ambulatory Visit: Payer: Self-pay | Admitting: *Deleted

## 2015-06-29 MED ORDER — FUROSEMIDE 40 MG PO TABS: 40.0000 mg | ORAL_TABLET | Freq: Every day | ORAL | Status: DC

## 2015-07-04 ENCOUNTER — Telehealth: Payer: Self-pay | Admitting: Internal Medicine

## 2015-07-04 NOTE — Telephone Encounter (Signed)
New message     Pt c/o of Chest Pain: STAT if CP now or developed within 24 hours  1. Are you having CP right now? Chest pressure with exertion    2. Are you experiencing any other symptoms (ex. SOB, nausea, vomiting, sweating)? With sob   3. How long have you been experiencing CP? For 2 weeks   4. Is your CP continuous or coming and going? Comes and going   5. Have you taken Nitroglycerin? No.  ?

## 2015-07-04 NOTE — Progress Notes (Signed)
Cardiology Office Note Date:  07/04/2015  Patient ID:  Alexandra, Reyes May 31, 1926, MRN [id number] PCP:  Oliver Barre, MD  Cardiologist:  Dr. Patty Sermons Electrophysiologist: Dr. Johney Frame  **refresh   Chief Complaint: CP/SOB, recent call with c/o tachycardia, was advised 07/08/14 to increase her amiodarone to  BID for 7 days  History of Present Illness: Alexandra Reyes is a 82 y.o. female with history of PAT, COPD is mentioned but last note with cards reports this r/o at her last PFT's, HTN, pectus excavatum, HLD, macular degeneration, and OA.  She comes in today with c/o **  She was last seen by Dr. Patty Sermons 05/26/15 who noted her c/o SOB, she had a normal treadmill Cardiolite stress test in 2010. She had another Myoview stress test on 06/02/14 which showed no evidence of ischemia and showed normal ejection fraction. She has not had cardiac catheterization. Echocardiogram in 2004 showed diastolic dysfunction and trace mitral regurgitation. Her last echocardiogram on 06/16/13 showed normal left ventricular systolic function with an ejection fraction of 60-65% and there was mild diastolic dysfunction and there were no valve abnormalities.  Her last EP visit with Dr. Johney Frame was 05/15/15, noted "SVT, Long RP, Likely atach with first degree AV block though I cannot exclude a reentrant arrhythmia.  Much improved with amiodarone. Will increase amiodarone to  BID x 3 days then return to  daily. I am optimistic that we will be able to avoid ablation which carries risk given her age. Will try to stop cardizem once arrhythmia is controlled given advanced conduction system disease"   She describes her symptoms as **  ** does she want an ischemic evaluation?? Given age ** try nitrate   Past Medical History  Diagnosis Date  . PAT (paroxysmal atrial tachycardia) (HCC)   . Macular degeneration   . OA (osteoarthritis)   . Hiatal hernia   . GERD (gastroesophageal reflux disease)   .  Cholelithiasis   . COPD (chronic obstructive pulmonary disease) (HCC)   . HTN (hypertension) 05/18/2011  . Hyperlipidemia 05/18/2011  . Osteoporosis 05/18/2011  . Anemia, iron deficiency 05/18/2011  . DDD (degenerative disc disease), lumbar 05/18/2011  . Pectus excavatum 05/18/2011  . Allergy   . Allergic rhinitis, cause unspecified 05/22/2011  . Alcohol dependence in remission (HCC) 05/22/2011  . Personal history of colonic polyps 10/28/2012  . First degree AV block   . SVT (supraventricular tachycardia) (HCC)   . Myocardial infarction [facility])     Past Surgical History  Procedure Laterality Date  . Tonsillectomy and adenoidectomy    . Breast lumpectomy    . US echocardiography  01/17/2003    EF 55-60%  . Cardiovascular stress test  03/08/2009    EF 84%  . Abdominal hysterectomy  1988  . Breast biopsy  1948    Current Outpatient Prescriptions  Medication Sig Dispense Refill  . ALPRAZolam (XANAX) 0.25 MG tablet Take 0.25 mg by mouth at bedtime as needed for anxiety.    Marland Kitchen amiodarone (PACERONE) 200 MG tablet Take 200 mg by mouth daily.    Marland Kitchen amLODipine (NORVASC) 2.5 MG tablet TAKE 1 TABLET BY MOUTH EVERY DAY 90 tablet 3  . Ascorbic Acid (VITAMIN C PO) Take 1 tablet by mouth daily.    Marland Kitchen aspirin 81 MG tablet Take 81 mg by mouth daily.      . B Complex Vitamins (VITAMIN B COMPLEX PO) Take 1 tablet by mouth daily.     . calcium carbonate (OS-CAL) 600 MG tablet  Take 600 mg by mouth daily.    Marland Kitchen diltiazem (CARDIZEM CD) 120 MG 24 hr capsule Take 1 capsule by mouth daily.    Marland Kitchen diltiazem (CARDIZEM) 30 MG tablet Take 1-2 tablets by mouth every 6 hours as needed for fast heart rates 30 tablet 1  . ferrous sulfate 325 (65 FE) MG tablet Take 325 mg by mouth daily.     . furosemide (LASIX) 40 MG tablet Take 1 tablet (40 mg total) by mouth daily. 90 tablet 3  . Glucosamine HCl-MSM (GLUCOSAMINE-MSM PO) Take 2 tablets by mouth daily.     Marland Kitchen ibuprofen (ADVIL,MOTRIN) 200 MG tablet Take 200 mg by mouth every 6  (six) hours as needed (pain).     . simvastatin (ZOCOR) 10 MG tablet Take 1 tablet (10 mg total) by mouth daily at 6 PM. 90 tablet 3  . SPIRIVA HANDIHALER 18 MCG inhalation capsule INHALE ONE CAPSULE BY MOUTH ONE TIME DAILY 30 capsule 5  . triamcinolone cream (KENALOG) 0.1 % APPLY TOPICALLY TO AFFECTED AREA(S) TWICE DAILY. 30 g 1  . vitamin E 200 UNIT capsule Take 200 Units by mouth daily.     No current facility-administered medications for this visit.    Allergies:   Lisinopril and Pneumovax 23   Social History:  The patient  reports that she quit smoking about 12 years ago. She does not have any smokeless tobacco history on file. She reports that she does not drink alcohol or use illicit drugs.   Family History:  The patient's family history includes Arthritis in her mother; Heart disease in her father; Heart failure in her mother; Hypertension in her father; Kidney failure in her father.**  ROS:  Please see the history of present illness. Otherwise, review of systems is positive for **.   All other systems are reviewed and otherwise negative.   PHYSICAL EXAM: ** VS:  There were no vitals taken for this visit. BMI: There is no weight on file to calculate BMI. Well nourished, well developed, in no acute distress HEENT: normocephalic, atraumatic Neck: no JVD, carotid bruits or masses Cardiac:  normal S1, S2; RRR; no significant murmurs, no rubs, or gallops Lungs:  clear to auscultation bilaterally, no wheezing, rhonchi or rales Abd: soft, nontender,  + BS MS: no deformity or atrophy Ext: no edema Skin: warm and dry, no rash Neuro:  No gross deficits appreciated Psych: euthymic mood, full affect  ** PPM/ICD site is stable, no tethering or discomfort   EKG:  Done today shows **  ** ?no changes from previous  Recent Labs: 05/24/2015: ALT 17; BUN 15; Creat 0.88; Potassium 3.9; Sodium 135; TSH 3.969  05/24/2015: Cholesterol 167; HDL 84; LDL Cholesterol 69; Total CHOL/HDL Ratio 2.0;  Triglycerides 71; VLDL 14   CrCl cannot be calculated (Unknown ideal weight.).   Wt Readings from Last 3 Encounters:  05/26/15 155 lb (70.308 kg)  05/15/15 154 lb 9.6 oz (70.126 kg)  05/01/15 156 lb 3.2 oz (70.852 kg)     Other studies reviewed: Additional studies/records reviewed today include: summarized above  ASSESSMENT AND PLAN:  ** AAD, labs/EKG, testing needed?  1. CP/SOB     ** anginal, given advanced age ??? Ischemic eval     ** try nitrate tx     no known hx of ischemic heart disease  2. SVT     On amiodarone     TSH and thyroid profile, LFTs OK Dec 2016  3. HTN  Disposition:  ** Will try  a trial of nitrate, send her for  CXR, F/u with **  Current medicines are reviewed at length with the patient today.  The patient did not have any concerns regarding medicines.**  SignedSherrilee Gilles, PA-C 07/04/2015 6:50 PM     [facility] [address] [place] [address] [phone] (office)  [phone] (fax)    This encounter was created in error - please disregard.

## 2015-07-04 NOTE — Telephone Encounter (Signed)
Pt states she has been having chest pain and shortness of breath for a couple of weeks, she discussed similar symptoms with Dr Patty Sermons at her office visit with him 05/26/15.  Pt states she had been given a Spiriva Inhaler in the past  for the shortness of breath but read that it could cause chest pain and has discontinued using it.  Pt states chest pain and shortness of breath is with exertion and is relieved with stopping activity.  Pt advised if she is having chest pain at this time she should report to ED for further evaluation. Pt states she is feeling better, does not want to go to ED and is requesting appt tomorrow for EKG.  Pt has been scheduled to see Janean Sark tomorrow, pt advised not to do any physical exertion, take it easy, call 911 if symptoms return or get worse.  Pt verbalized understanding, agreed.

## 2015-07-05 ENCOUNTER — Encounter: Payer: Medicare Other | Admitting: Physician Assistant

## 2015-07-05 ENCOUNTER — Ambulatory Visit (INDEPENDENT_AMBULATORY_CARE_PROVIDER_SITE_OTHER): Payer: Medicare Other | Admitting: Cardiology

## 2015-07-05 ENCOUNTER — Encounter: Payer: Self-pay | Admitting: Cardiology

## 2015-07-05 VITALS — BP 120/66 | HR 80 | Ht 63.0 in | Wt 155.8 lb

## 2015-07-05 DIAGNOSIS — R0789 Other chest pain: Secondary | ICD-10-CM

## 2015-07-05 DIAGNOSIS — R0600 Dyspnea, unspecified: Secondary | ICD-10-CM | POA: Diagnosis not present

## 2015-07-05 MED ORDER — NITROGLYCERIN 0.4 MG SL SUBL: 0.4000 mg | SUBLINGUAL_TABLET | SUBLINGUAL | Status: DC | PRN

## 2015-07-05 NOTE — Patient Instructions (Signed)
Medication Instructions:  NTG  AS  DIRECTED  Labwork: TODAY CBC   Testing/Procedures: Your physician has requested that you have an echocardiogram. Echocardiography is a painless test that uses sound waves to create images of your heart. It provides your doctor with information about the size and shape of your heart and how well your heart's chambers and valves are working. This procedure takes approximately one hour. There are no restrictions for this procedure. Your physician has requested that you have a lexiscan myoview. For further information please visit [email or web address] Please follow instruction sheet, as given.  Follow-Up: AS  SCHEDULED  Any Other Special Instructions Will Be Listed Below (If Applicable). Nitroglycerin sublingual tablets What is this medicine? NITROGLYCERIN (nye troe GLI ser in) is a type of vasodilator. It relaxes blood vessels, increasing the blood and oxygen supply to your heart. This medicine is used to relieve chest pain caused by angina. It is also used to prevent chest pain before activities like climbing stairs, going outdoors in cold weather, or sexual activity. This medicine may be used for other purposes; ask your health care provider or pharmacist if you have questions. What should I tell my health care provider before I take this medicine? They need to know if you have any of these conditions: -anemia -head injury, recent stroke, or bleeding in the brain -liver disease -previous heart attack -an unusual or allergic reaction to nitroglycerin, other medicines, foods, dyes, or preservatives -pregnant or trying to get pregnant -breast-feeding How should I use this medicine? Take this medicine by mouth as needed. At the first sign of an angina attack (chest pain or tightness) place one tablet under your tongue. You can also take this medicine 5 to 10 minutes before an event likely to produce chest pain. Follow the directions on the prescription  label. Let the tablet dissolve under the tongue. Do not swallow whole. Replace the dose if you accidentally swallow it. It will help if your mouth is not dry. Saliva around the tablet will help it to dissolve more quickly. Do not eat or drink, smoke or chew tobacco while a tablet is dissolving. If you are not better within 5 minutes after taking ONE dose of nitroglycerin, call 9-1-1 immediately to seek emergency medical care. Do not take more than 3 nitroglycerin tablets over 15 minutes. If you take this medicine often to relieve symptoms of angina, your doctor or health care professional may provide you with different instructions to manage your symptoms. If symptoms do not go away after following these instructions, it is important to call 9-1-1 immediately. Do not take more than 3 nitroglycerin tablets over 15 minutes. Talk to your pediatrician regarding the use of this medicine in children. Special care may be needed. Overdosage: If you think you have taken too much of this medicine contact a poison control center or emergency room at once. NOTE: This medicine is only for you. Do not share this medicine with others. What if I miss a dose? This does not apply. This medicine is only used as needed. What may interact with this medicine? Do not take this medicine with any of the following medications: -certain migraine medicines like ergotamine and dihydroergotamine (DHE) -medicines used to treat erectile dysfunction like sildenafil, tadalafil, and vardenafil -riociguat This medicine may also interact with the following medications: -alteplase -aspirin -heparin -medicines for high blood pressure -medicines for mental depression -other medicines used to treat angina -phenothiazines like chlorpromazine, mesoridazine, prochlorperazine, thioridazine This list may not describe all possible  interactions. Give your health care provider a list of all the medicines, herbs, non-prescription drugs, or  dietary supplements you use. Also tell them if you smoke, drink alcohol, or use illegal drugs. Some items may interact with your medicine. What should I watch for while using this medicine? Tell your doctor or health care professional if you feel your medicine is no longer working. Keep this medicine with you at all times. Sit or lie down when you take your medicine to prevent falling if you feel dizzy or faint after using it. Try to remain calm. This will help you to feel better faster. If you feel dizzy, take several deep breaths and lie down with your feet propped up, or bend forward with your head resting between your knees. You may get drowsy or dizzy. Do not drive, use machinery, or do anything that needs mental alertness until you know how this drug affects you. Do not stand or sit up quickly, especially if you are an older patient. This reduces the risk of dizzy or fainting spells. Alcohol can make you more drowsy and dizzy. Avoid alcoholic drinks. Do not treat yourself for coughs, colds, or pain while you are taking this medicine without asking your doctor or health care professional for advice. Some ingredients may increase your blood pressure. What side effects may I notice from receiving this medicine? Side effects that you should report to your doctor or health care professional as soon as possible: -blurred vision -dry mouth -skin rash -sweating -the feeling of extreme pressure in the head -unusually weak or tired Side effects that usually do not require medical attention (report to your doctor or health care professional if they continue or are bothersome): -flushing of the face or neck -headache -irregular heartbeat, palpitations -nausea, vomiting This list may not describe all possible side effects. Call your doctor for medical advice about side effects. You may report side effects to FDA at 1-800-FDA-1088. Where should I keep my medicine? Keep out of the reach of  children. Store at room temperature between 20 and 25 degrees C (68 and 77 degrees F). Store in Retail buyer. Protect from light and moisture. Keep tightly closed. Throw away any unused medicine after the expiration date. NOTE: This sheet is a summary. It may not cover all possible information. If you have questions about this medicine, talk to your doctor, pharmacist, or health care provider.    2016, Elsevier/Gold Standard. (2013-03-25 17:57:36)      If you need a refill on your cardiac medications before your next appointment, please call your pharmacy.

## 2015-07-05 NOTE — Progress Notes (Signed)
07/05/2015 Alexandra Reyes   1925-08-29  [id number]  Primary Physician Oliver Barre, MD Primary Cardiologist: Dr. Patty Sermons Electrophysiologist: Dr. Johney Frame  Reason for Visit/CC: Exertional CP and Dyspnea.   HPI:  The patient is a 82 y/o female, followed by Dr. Patty Sermons and Dr. Johney Frame.  She is a retired [employer] fundraiser. She presents to clinic today for evaluation of dyspnea. She has a past history of diastolic dysfunction with dyspnea and a history of paroxysmal supraventricular tachycardia. She does not have any history of ischemic heart disease. She had a normal treadmill Cardiolite stress test in 2010. She had another Myoview stress test on 06/02/14 which showed no evidence of ischemia and showed normal ejection fraction. She has not had cardiac catheterization. Echocardiogram in 2004 showed diastolic dysfunction and trace mitral regurgitation. Given increased dyspnea, she had a chest x-ray on 04/28/13 which showed hyperinflation. She has had pulmonary function studies through Dr. Raphael Gibney office. Initially she was felt to have possible COPD.Subsequently she was told that she did not have COPD after she had her pulmonary function studies.Her last echocardiogram on 06/16/13 showed normal left ventricular systolic function with an ejection fraction of 60-65% and there was mild diastolic dysfunction and there were no valve abnormalities.  She  has a long history of paroxysmal supraventricular tachycardia. She saw Dr. Johney Frame who diagnosed probable atrial tachycardia and placed her on amiodarone. She has had a good response to amiodarone. Dr. Johney Frame felt that at her advanced age she would do better if we could avoid an ablation procedure if at all possible.She was last seen by Dr. Patty Sermons 05/26/15 and was felt to be stable from a cardiac standpoint.   She now presents to [facility] with a complaint of exertional CP and dyspnea. She gives out walking short distances. She notes chest tightness with exertion  along with her dyspnea. No associated wheezing. She was using Spiriva but had no improvement and actually made her feel worse. She denies any symptoms at rest.  No significant palpitations. She feels amiodarone and Cardizem has helped her SVT. She denies any issues with LEE but wears compression stockings daily. She reports full medication compliance. She note past h/o anemia but denies any recent melena/ hematochezia.   EKG shows sinus rhythm with 1st degree AV block. HR is 80 bpm.    Current Outpatient Prescriptions  Medication Sig Dispense Refill  . acetaminophen (TYLENOL) 325 MG tablet Take 650 mg by mouth every 6 (six) hours as needed (joint pain).    Marland Kitchen ALPRAZolam (XANAX) 0.25 MG tablet Take 0.25 mg by mouth at bedtime as needed for anxiety.    Marland Kitchen amiodarone (PACERONE) 200 MG tablet Take 200 mg by mouth daily.    Marland Kitchen amLODipine (NORVASC) 2.5 MG tablet TAKE 1 TABLET BY MOUTH EVERY DAY 90 tablet 3  . Ascorbic Acid (VITAMIN C PO) Take 1 tablet by mouth daily.    Marland Kitchen aspirin 81 MG tablet Take 81 mg by mouth daily.      . B Complex Vitamins (VITAMIN B COMPLEX PO) Take 1 tablet by mouth daily.     . calcium carbonate (OS-CAL) 600 MG tablet Take 600 mg by mouth daily.    Marland Kitchen diltiazem (CARDIZEM CD) 120 MG 24 hr capsule Take 1 capsule by mouth daily.    Marland Kitchen diltiazem (CARDIZEM) 30 MG tablet Take 1-2 tablets by mouth every 6 hours as needed for fast heart rates 30 tablet 1  . ferrous sulfate 325 (65 FE) MG tablet Take 325 mg by  mouth daily.     . furosemide (LASIX) 40 MG tablet Take 1 tablet (40 mg total) by mouth daily. 90 tablet 3  . Glucosamine HCl-MSM (GLUCOSAMINE-MSM PO) Take 2 tablets by mouth daily.     . simvastatin (ZOCOR) 10 MG tablet Take 1 tablet (10 mg total) by mouth daily at 6 PM. 90 tablet 3  . triamcinolone cream (KENALOG) 0.1 % APPLY TOPICALLY TO AFFECTED AREA(S) TWICE DAILY. 30 g 1  . vitamin E 200 UNIT capsule Take 200 Units by mouth daily.     No current facility-administered  medications for this visit.    Allergies  Allergen Reactions  . Lisinopril     Pt states she's never taken Lisinopril  . Pneumovax 23 [Pneumococcal Vac Polyvalent] Other (See Comments)    Flu like symtpoms for 1 day    Social History   Social History  . Marital Status: Widowed    Spouse Name: N/A  . Number of Children: N/A  . Years of Education: 11   Occupational History  . Retired Designer, jewellery    Social History Main Topics  . Smoking status: Former Smoker    Quit date: 01/24/2003  . Smokeless tobacco: Not on file  . Alcohol Use: No  . Drug Use: No  . Sexual Activity: Not on file   Other Topics Concern  . Not on file   Social History Narrative     Review of Systems: General: negative for chills, fever, night sweats or weight changes.  Cardiovascular: negative for chest pain, dyspnea on exertion, edema, orthopnea, palpitations, paroxysmal nocturnal dyspnea or shortness of breath Dermatological: negative for rash Respiratory: negative for cough or wheezing Urologic: negative for hematuria Abdominal: negative for nausea, vomiting, diarrhea, bright red blood per rectum, melena, or hematemesis Neurologic: negative for visual changes, syncope, or dizziness All other systems reviewed and are otherwise negative except as noted above.    Blood pressure 120/66, pulse 80, height  (1.6 m), weight 155 lb 12.8 oz (70.67 kg).  General appearance: alert, cooperative and no distress Neck: no carotid bruit and no JVD Lungs: clear to auscultation bilaterally Heart: regular rate and rhythm, 2/6 SM best heard along LUSB Extremities: no LEE, has on compression stockings Pulses: 2+ and symmetric Skin: warm and dry Neurologic: Grossly normal   EKG Sinus rhythm, 80 bpm   ASSESSMENT AND PLAN:   1. Exertional CP + Dyspnea: lung exam is normal, so low suspicion for amiodarone toxicity. She has a murmur on physical exam. She does not recall being told of this in the past  and not outlined in previous office note. We will check a 2D echo to assess valve function as well as r/o systolic dysfunction and wall motion abnormalties. We will also check a BNP to r/o potential acute CHF exacerbation, although this is less likely given no signs of volume overload. Given her prior h/o anemia, we will check a CBC to ensure H/H is stable.    PLAN  Plan to keep f/u with Dr. Johney Frame in EP clinic next week. We will notify patient of lab/ study results and will instruct to come back to clinic for f/u if any additional work-up or management is needed.   Robbie Lis PA-C 07/05/2015 2:43 PM

## 2015-07-07 ENCOUNTER — Ambulatory Visit (HOSPITAL_COMMUNITY)
Admission: RE | Admit: 2015-07-07 | Discharge: 2015-07-07 | Disposition: A | Discharge status: Home / Self Care | Payer: Medicare Other | Source: Ambulatory Visit | Attending: Cardiology | Admitting: Cardiology

## 2015-07-07 DIAGNOSIS — R0600 Dyspnea, unspecified: Secondary | ICD-10-CM

## 2015-07-07 DIAGNOSIS — I252 Old myocardial infarction: Secondary | ICD-10-CM | POA: Insufficient documentation

## 2015-07-07 DIAGNOSIS — I10 Essential (primary) hypertension: Secondary | ICD-10-CM | POA: Diagnosis not present

## 2015-07-07 DIAGNOSIS — I071 Rheumatic tricuspid insufficiency: Secondary | ICD-10-CM | POA: Diagnosis not present

## 2015-07-07 DIAGNOSIS — J449 Chronic obstructive pulmonary disease, unspecified: Secondary | ICD-10-CM | POA: Diagnosis not present

## 2015-07-07 DIAGNOSIS — R0789 Other chest pain: Secondary | ICD-10-CM | POA: Insufficient documentation

## 2015-07-07 DIAGNOSIS — Z87891 Personal history of nicotine dependence: Secondary | ICD-10-CM | POA: Insufficient documentation

## 2015-07-07 DIAGNOSIS — E785 Hyperlipidemia, unspecified: Secondary | ICD-10-CM | POA: Insufficient documentation

## 2015-07-07 MED ORDER — PERFLUTREN LIPID MICROSPHERE
1.0000 mL | INTRAVENOUS | Status: AC | PRN
Start: 2015-07-07 — End: 2015-07-07
  Administered 2015-07-07: 2 mL via INTRAVENOUS
  Filled 2015-07-07: qty 10

## 2015-07-07 NOTE — Progress Notes (Signed)
  Echocardiogram 2D Echocardiogram with Definity has been performed.  Alexandra Reyes 07/07/2015, 2:07 PM

## 2015-07-10 ENCOUNTER — Telehealth (HOSPITAL_COMMUNITY): Payer: Self-pay | Admitting: *Deleted

## 2015-07-10 ENCOUNTER — Encounter: Payer: Self-pay | Admitting: Internal Medicine

## 2015-07-10 ENCOUNTER — Ambulatory Visit (INDEPENDENT_AMBULATORY_CARE_PROVIDER_SITE_OTHER): Payer: Medicare Other | Admitting: Internal Medicine

## 2015-07-10 VITALS — BP 138/70 | HR 81 | Ht 63.0 in | Wt 155.0 lb

## 2015-07-10 DIAGNOSIS — R0602 Shortness of breath: Secondary | ICD-10-CM

## 2015-07-10 DIAGNOSIS — I10 Essential (primary) hypertension: Secondary | ICD-10-CM

## 2015-07-10 DIAGNOSIS — I471 Supraventricular tachycardia, unspecified: Secondary | ICD-10-CM

## 2015-07-10 DIAGNOSIS — R0609 Other forms of dyspnea: Secondary | ICD-10-CM | POA: Diagnosis not present

## 2015-07-10 NOTE — Patient Instructions (Addendum)
Medication Instructions:  Your physician recommends that you continue on your current medications as directed. Please refer to the Current Medication list given to you today.   Labwork: None ordered   Testing/Procedures:  Move up stress test per Dr Johney Frame  Your physician has recommended that you have a pulmonary function test. Pulmonary Function Tests are a group of tests that measure how well air moves in and out of your lungs.      Follow-Up:  Your physician recommends that you schedule a follow-up appointment in: 4 weeks with Dr Johney Frame     Any Other Special Instructions Will Be Listed Below (If Applicable).     If you need a refill on your cardiac medications before your next appointment, please call your pharmacy.

## 2015-07-10 NOTE — Telephone Encounter (Signed)
Patient given detailed instructions per Myocardial Perfusion Study Information Sheet for the test on 07/10/15 at 0945. Patient notified to arrive 15 minutes early and that it is imperative to arrive on time for appointment to keep from having the test rescheduled.  If you need to cancel or reschedule your appointment, please call the office within 24 hours of your appointment. Failure to do so may result in a cancellation of your appointment, and a $50 no show fee. Patient verbalized understanding.Adithya Difrancesco, Adelene Idler

## 2015-07-10 NOTE — Progress Notes (Signed)
Electrophysiology Office Note   Date:  07/10/2015   ID:  Merrisa, Skorupski Aug 31, 1925, MRN [id number]  PCP:  Oliver Barre, MD  Cardiologist:  Dr Anne Fu, (previously Patty Sermons) Primary Electrophysiologist: Hillis Range, MD    Chief Complaint  Patient presents with  . PSVT     History of Present Illness: Alexandra Reyes is a 82 y.o. female who presents today for electrophysiology evaluation.  She seems to be having more troubles with SOB with exertion.  She also reports exertional chest tightness.  She was seen by [place] and had an echo which I have reviewed today as well as a myoview ordered (pending).   She denies symptoms at rest.  Feels occasionally that her heart rates are 130s but overall seems that her atch is better.    Past Medical History  Diagnosis Date  . PAT (paroxysmal atrial tachycardia) (HCC)   . Macular degeneration   . OA (osteoarthritis)   . Hiatal hernia   . GERD (gastroesophageal reflux disease)   . Cholelithiasis   . COPD (chronic obstructive pulmonary disease) (HCC)   . HTN (hypertension) 05/18/2011  . Hyperlipidemia 05/18/2011  . Osteoporosis 05/18/2011  . Anemia, iron deficiency 05/18/2011  . DDD (degenerative disc disease), lumbar 05/18/2011  . Pectus excavatum 05/18/2011  . Allergy   . Allergic rhinitis, cause unspecified 05/22/2011  . Alcohol dependence in remission (HCC) 05/22/2011  . Personal history of colonic polyps 10/28/2012  . First degree AV block   . SVT (supraventricular tachycardia) (HCC)   . Myocardial infarction [facility])    Past Surgical History  Procedure Laterality Date  . Tonsillectomy and adenoidectomy    . Breast lumpectomy    . US echocardiography  01/17/2003    EF 55-60%  . Cardiovascular stress test  03/08/2009    EF 84%  . Abdominal hysterectomy  1988  . Breast biopsy  1948     Current Outpatient Prescriptions  Medication Sig Dispense Refill  . acetaminophen (TYLENOL) 325 MG tablet Take 650 mg by mouth every  6 (six) hours as needed (joint pain).    Marland Kitchen ALPRAZolam (XANAX) 0.25 MG tablet Take 0.25 mg by mouth at bedtime as needed for anxiety.    Marland Kitchen amiodarone (PACERONE) 200 MG tablet Take 200 mg by mouth daily.    Marland Kitchen amLODipine (NORVASC) 2.5 MG tablet TAKE 1 TABLET BY MOUTH EVERY DAY 90 tablet 3  . Ascorbic Acid (VITAMIN C PO) Take 1 tablet by mouth daily.    Marland Kitchen aspirin 81 MG tablet Take 81 mg by mouth daily.      . B Complex Vitamins (VITAMIN B COMPLEX PO) Take 1 tablet by mouth daily.     . calcium carbonate (OS-CAL) 600 MG tablet Take 600 mg by mouth daily.    Marland Kitchen diltiazem (CARDIZEM CD) 120 MG 24 hr capsule Take 1 capsule by mouth daily.    Marland Kitchen diltiazem (CARDIZEM) 30 MG tablet Take 1-2 tablets by mouth every 6 hours as needed for fast heart rates 30 tablet 1  . ferrous sulfate 325 (65 FE) MG tablet Take 325 mg by mouth daily.     . furosemide (LASIX) 40 MG tablet Take 1 tablet (40 mg total) by mouth daily. 90 tablet 3  . Glucosamine HCl-MSM (GLUCOSAMINE-MSM PO) Take 2 tablets by mouth daily.     . nitroGLYCERIN (NITROSTAT) 0.4 MG SL tablet Place 1 tablet (0.4 mg total) under the tongue every 5 (five) minutes as needed for chest pain. 25 tablet  3  . simvastatin (ZOCOR) 10 MG tablet Take 1 tablet (10 mg total) by mouth daily at 6 PM. 90 tablet 3  . triamcinolone cream (KENALOG) 0.1 % Apply 1 application topically 2 (two) times daily. Apply to affected area    . vitamin E 200 UNIT capsule Take 200 Units by mouth daily.     No current facility-administered medications for this visit.    Allergies:   Lisinopril and Pneumovax 23   Social History:  The patient  reports that she quit smoking about 12 years ago. She does not have any smokeless tobacco history on file. She reports that she does not drink alcohol or use illicit drugs.   Family History:  The patient's family history includes Arthritis in her mother; Heart disease in her father; Heart failure in her mother; Hypertension in her father; Kidney  failure in her father.    ROS:  Please see the history of present illness.   All other systems are reviewed and negative.    PHYSICAL EXAM: VS:  BP 138/70 mmHg  Pulse 81  Ht  (1.6 m)  Wt 155 lb (70.308 kg)  BMI 27.46 kg/m2 , BMI Body mass index is 27.46 kg/(m^2). GEN: elderly, in no acute distress HEENT: normal Neck: no JVD, carotid bruits, or masses Cardiac: RRR; no murmurs, rubs, or gallops,+1 edema , support hose in place Respiratory:  clear to auscultation bilaterally, normal work of breathing GI: soft, nontender, nondistended, + BS MS: no deformity or atrophy Skin: warm and dry  Neuro:  Strength and sensation are intact Psych: euthymic mood, full affect  EKG:  EKG 07/05/15 reviewed reveals sinus rhythm with first degree AV block,  anteroseptal and inferior infarct pattern   Recent Labs: 05/24/2015: ALT 17; BUN 15; Creat 0.88; Potassium 3.9; Sodium 135; TSH 3.969    Lipid Panel     Component Value Date/Time   CHOL 167 05/24/2015 0831   TRIG 71 05/24/2015 0831   HDL 84 05/24/2015 0831   CHOLHDL 2.0 05/24/2015 0831   VLDL 14 05/24/2015 0831   LDLCALC 69 05/24/2015 0831   LDLDIRECT 129.2 05/15/2011 0925     Wt Readings from Last 3 Encounters:  07/10/15 155 lb (70.308 kg)  07/05/15 155 lb 12.8 oz (70.67 kg)  05/26/15 155 lb (70.308 kg)     ASSESSMENT AND PLAN:  1.  SVT Long RP,  Likely atach with first degree AV block though I cannot exclude a reentrant arrhythmia.  Continue amiodarone  daily for now and wean as able pfts ordered today  2. HTN Stable No change required today  3. Venous insufficiency Support hose  4. SOB/ chest discomfort myoview pending PFTs also ordered today   Return to see me in 4 weeks Follow-up with Dr Anne Fu as scheduled  Signed, Hillis Range, MD  07/10/2015 9:00 AM     [facility] [address] [place] [address] [phone] (office) [phone] (fax)

## 2015-07-12 ENCOUNTER — Ambulatory Visit (INDEPENDENT_AMBULATORY_CARE_PROVIDER_SITE_OTHER): Payer: Medicare Other | Admitting: Internal Medicine

## 2015-07-12 ENCOUNTER — Ambulatory Visit (HOSPITAL_COMMUNITY): Payer: Medicare Other | Attending: Cardiovascular Disease

## 2015-07-12 DIAGNOSIS — R9439 Abnormal result of other cardiovascular function study: Secondary | ICD-10-CM | POA: Diagnosis not present

## 2015-07-12 DIAGNOSIS — R0789 Other chest pain: Secondary | ICD-10-CM | POA: Diagnosis not present

## 2015-07-12 DIAGNOSIS — R0602 Shortness of breath: Secondary | ICD-10-CM

## 2015-07-12 DIAGNOSIS — I10 Essential (primary) hypertension: Secondary | ICD-10-CM | POA: Diagnosis not present

## 2015-07-12 DIAGNOSIS — I4891 Unspecified atrial fibrillation: Secondary | ICD-10-CM | POA: Diagnosis not present

## 2015-07-12 LAB — MYOCARDIAL PERFUSION IMAGING
CHL CUP NUCLEAR SRS: 7
CHL CUP RESTING HR STRESS: 71 {beats}/min
CSEPPHR: 84 {beats}/min
LV dias vol: 77 mL
LVSYSVOL: 15 mL
NUC STRESS TID: 1.05
RATE: 0.38
SDS: 6
SSS: 10

## 2015-07-12 LAB — PULMONARY FUNCTION TEST
DL/VA % pred: 70 %
DL/VA: 3.31 ml/min/mmHg/L
DLCO UNC % PRED: 52 %
DLCO unc: 12.09 ml/min/mmHg
FEF 25-75 PRE: 0.78 L/s
FEF 25-75 Post: 1.43 L/sec
FEF2575-%Change-Post: 83 %
FEF2575-%PRED-POST: 148 %
FEF2575-%PRED-PRE: 81 %
FEV1-%Change-Post: 12 %
FEV1-%Pred-Post: 111 %
FEV1-%Pred-Pre: 99 %
FEV1-POST: 1.76 L
FEV1-PRE: 1.57 L
FEV1FVC-%Change-Post: 7 %
FEV1FVC-%PRED-PRE: 95 %
FEV6-%CHANGE-POST: 5 %
FEV6-%PRED-POST: 117 %
FEV6-%Pred-Pre: 111 %
FEV6-PRE: 2.25 L
FEV6-Post: 2.37 L
FEV6FVC-%CHANGE-POST: 1 %
FEV6FVC-%PRED-PRE: 105 %
FEV6FVC-%Pred-Post: 106 %
FVC-%CHANGE-POST: 4 %
FVC-%PRED-POST: 110 %
FVC-%Pred-Pre: 106 %
FVC-Post: 2.39 L
FVC-Pre: 2.29 L
POST FEV1/FVC RATIO: 74 %
PRE FEV1/FVC RATIO: 68 %
Post FEV6/FVC ratio: 99 %
Pre FEV6/FVC Ratio: 98 %

## 2015-07-12 MED ORDER — REGADENOSON 0.4 MG/5ML IV SOLN
0.4000 mg | Freq: Once | INTRAVENOUS | Status: AC
  Administered 2015-07-12: 0.4 mg via INTRAVENOUS

## 2015-07-12 MED ORDER — TECHNETIUM TC 99M SESTAMIBI GENERIC - CARDIOLITE
10.6000 | Freq: Once | INTRAVENOUS | Status: AC | PRN
  Administered 2015-07-12: 11 via INTRAVENOUS

## 2015-07-12 MED ORDER — TECHNETIUM TC 99M SESTAMIBI GENERIC - CARDIOLITE
32.6000 | Freq: Once | INTRAVENOUS | Status: AC | PRN
  Administered 2015-07-12: 33 via INTRAVENOUS

## 2015-07-12 NOTE — Progress Notes (Signed)
PFT done today. 

## 2015-07-18 ENCOUNTER — Telehealth: Payer: Self-pay | Admitting: Internal Medicine

## 2015-07-18 NOTE — Telephone Encounter (Signed)
Pt has had chest pain and SOB for several weeks, still having both -wants to know what Dr. Johney Frame wants her to do, wants results of test she had done last week, and should she go back on her Spiriva inhaler?? pls call  [phone]

## 2015-07-18 NOTE — Telephone Encounter (Signed)
Pt states she continues to have shortness of breath that is unchanged from time of office visit with Dr Johney Frame 07/10/15. Pt has had myoview and echo that were okay. Pt states she has been off Spiriva for about a month because she felt it was contributing to tachycardia. Pt states good news is that she has not had tachycardia in 1 and 1/2 weeks.

## 2015-07-18 NOTE — Telephone Encounter (Signed)
Pt states she did think Spiriva helped her shortness of breath when she was using it. Pt advised I will forward to Dr Johney Frame for review and recommendations for treatment of shortness of breath.

## 2015-07-19 ENCOUNTER — Encounter (HOSPITAL_COMMUNITY): Payer: Medicare Other

## 2015-07-19 DIAGNOSIS — F4329 Adjustment disorder with other symptoms: Secondary | ICD-10-CM | POA: Diagnosis not present

## 2015-07-19 NOTE — Telephone Encounter (Signed)
Resume spiriva and then reassess.

## 2015-07-19 NOTE — Telephone Encounter (Signed)
Follow up      Calling to let us know that she will not be available the rest of the afternoon.  If we call with instructions from Dr Weyman Pedro call tomorrow.

## 2015-07-20 NOTE — Telephone Encounter (Signed)
Spoke with patient and let her know Dr Johney Frame suggested to restart the Spiriva and then we will reassess

## 2015-08-02 ENCOUNTER — Other Ambulatory Visit (INDEPENDENT_AMBULATORY_CARE_PROVIDER_SITE_OTHER): Payer: Medicare Other

## 2015-08-02 ENCOUNTER — Ambulatory Visit (INDEPENDENT_AMBULATORY_CARE_PROVIDER_SITE_OTHER): Payer: Medicare Other | Admitting: Internal Medicine

## 2015-08-02 ENCOUNTER — Encounter: Payer: Self-pay | Admitting: Internal Medicine

## 2015-08-02 VITALS — BP 118/60 | HR 63 | Temp 98.6°F | Resp 20 | Wt 156.0 lb

## 2015-08-02 DIAGNOSIS — R0600 Dyspnea, unspecified: Secondary | ICD-10-CM

## 2015-08-02 DIAGNOSIS — E785 Hyperlipidemia, unspecified: Secondary | ICD-10-CM

## 2015-08-02 DIAGNOSIS — I10 Essential (primary) hypertension: Secondary | ICD-10-CM

## 2015-08-02 LAB — CBC WITH DIFFERENTIAL/PLATELET
BASOS ABS: 0 10*3/uL (ref 0.0–0.1)
BASOS PCT: 0.2 % (ref 0.0–3.0)
Eosinophils Absolute: 0.1 10*3/uL (ref 0.0–0.7)
Eosinophils Relative: 1.5 % (ref 0.0–5.0)
HEMATOCRIT: 42.7 % (ref 36.0–46.0)
HEMOGLOBIN: 14.4 g/dL (ref 12.0–15.0)
LYMPHS PCT: 11.9 % — AB (ref 12.0–46.0)
Lymphs Abs: 1.1 10*3/uL (ref 0.7–4.0)
MCHC: 33.7 g/dL (ref 30.0–36.0)
MCV: 83.7 fl (ref 78.0–100.0)
MONOS PCT: 9.8 % (ref 3.0–12.0)
Monocytes Absolute: 0.9 10*3/uL (ref 0.1–1.0)
NEUTROS ABS: 7 10*3/uL (ref 1.4–7.7)
Neutrophils Relative %: 76.6 % (ref 43.0–77.0)
PLATELETS: 390 10*3/uL (ref 150.0–400.0)
RBC: 5.1 Mil/uL (ref 3.87–5.11)
RDW: 16.1 % — ABNORMAL HIGH (ref 11.5–15.5)
WBC: 9.2 10*3/uL (ref 4.0–10.5)

## 2015-08-02 LAB — HEPATIC FUNCTION PANEL
ALT: 16 U/L (ref 0–35)
AST: 18 U/L (ref 0–37)
Albumin: 4.2 g/dL (ref 3.5–5.2)
Alkaline Phosphatase: 62 U/L (ref 39–117)
BILIRUBIN DIRECT: 0.1 mg/dL (ref 0.0–0.3)
BILIRUBIN TOTAL: 0.5 mg/dL (ref 0.2–1.2)
TOTAL PROTEIN: 7 g/dL (ref 6.0–8.3)

## 2015-08-02 LAB — BASIC METABOLIC PANEL
BUN: 17 mg/dL (ref 6–23)
CALCIUM: 9.5 mg/dL (ref 8.4–10.5)
CO2: 28 mEq/L (ref 19–32)
CREATININE: 1.09 mg/dL (ref 0.40–1.20)
Chloride: 98 mEq/L (ref 96–112)
GFR: 50.42 mL/min — ABNORMAL LOW (ref >60.00)
Glucose, Bld: 94 mg/dL (ref 70–99)
Potassium: 4.3 mEq/L (ref 3.5–5.1)
Sodium: 133 mEq/L — ABNORMAL LOW (ref 135–145)

## 2015-08-02 NOTE — Assessment & Plan Note (Addendum)
Has minimal copd by pft's and little response to the spiriva and she is convinced it made her tachycardias worse though also quite anxious;  Ok to hold off on the spiriva for now, will check CTA chest r/o PE and pulm fibrosis, sats ok for now, also for pulm referral; also for repeat labs in light of CT as has been > 1 mo  Note:  Total time for pt hx, exam, review of record with pt in the room, determination of diagnoses and plan for further eval and tx is > 40 min, with over 50% spent in coordination and counseling of patient

## 2015-08-02 NOTE — Patient Instructions (Addendum)
Please continue all other medications as before, and refills have been done if requested.  Please have the pharmacy call with any other refills you may need.  Please continue your efforts at being more active, low cholesterol diet, and weight control.  You are otherwise up to date with prevention measures today.  Please keep your appointments with your specialists as you may have planned  You will be contacted regarding the referral for: CT angio of Chest (to see PCC's now_  Please go to the LAB in the Basement (turn left off the elevator) for the tests to be done today  You will be contacted by phone if any changes need to be made immediately.  Otherwise, you will receive a letter about your results with an explanation, but please check with MyChart first.  Please remember to sign up for MyChart if you have not done so, as this will be important to you in the future with finding out test results, communicating by private email, and scheduling acute appointments online when needed.  Please return in 4 weeks, or sooner if needed

## 2015-08-02 NOTE — Progress Notes (Signed)
Pre visit review using our clinic review tool, if applicable. No additional management support is needed unless otherwise documented below in the visit note. 

## 2015-08-02 NOTE — Progress Notes (Signed)
Subjective:    Patient ID: Alexandra Reyes, female    DOB: [birth year], 82 y.o.   MRN: [id number]  HPI  Here to f/u -  C/o 3 mo ongoing Chest pain and dyspnea unclear etiology. "Ive just gotten old just in the past year."  Has had significant cardiac eval and tx, with Dr Patty Sermons and Dr Johney Frame, now on amiodarone.  Had recent echo (see below), and stress test low risk.  PFT's with minimal obstruction, minimal improvement with b dilators, mod diffusion defecit.   Last cxr nov 2014.  Tylenol no help, has tramadol at home for persistent chronic chest pain.  Had cardizem for intermittent tachycardias.  Overall more anxiuos recently, only takes half of the xanax provided as she is trying to do wtihout if possible.  Pt denies new neurological symptoms such as new headache, or facial or extremity weakness or numbness . Pt denies polydipsia, polyuria, or low sugar symptoms such as weakness or confusion improved with po intake.  Pt states overall good compliance with meds, trying to follow lower cholesterol, diabetic diet, wt overall stable but little exercise however.    Recent labs oK Lab Results  Component Value Date   WBC 6.6 05/14/2013   HGB 13.1 05/14/2013   HCT 39.5 05/14/2013   PLT 328.0 05/14/2013   GLUCOSE 81 05/24/2015   CHOL 167 05/24/2015   TRIG 71 05/24/2015   HDL 84 05/24/2015   LDLDIRECT 129.2 05/15/2011   LDLCALC 69 05/24/2015   ALT 17 05/24/2015   AST 19 05/24/2015   NA 135 05/24/2015   K 3.9 05/24/2015   CL 102 05/24/2015   CREATININE 0.88 05/24/2015   BUN 15 05/24/2015   CO2 24 05/24/2015   TSH 3.969 05/24/2015   INR 1.0 07/13/2007   HGBA1C  07/13/2007    5.8 (NOTE)   The ADA recommends the following therapeutic goals for glycemic   control related to Hgb A1C measurement:   Goal of Therapy:   < 7.0% Hgb A1C   Action Suggested:  > 8.0% Hgb A1C   Ref:  Diabetes Care, 22, Suppl. 1, 1999    Past Medical History  Diagnosis Date  . PAT (paroxysmal atrial tachycardia) (HCC)     . Macular degeneration   . OA (osteoarthritis)   . Hiatal hernia   . GERD (gastroesophageal reflux disease)   . Cholelithiasis   . COPD (chronic obstructive pulmonary disease) (HCC)   . HTN (hypertension) 05/18/2011  . Hyperlipidemia 05/18/2011  . Osteoporosis 05/18/2011  . Anemia, iron deficiency 05/18/2011  . DDD (degenerative disc disease), lumbar 05/18/2011  . Pectus excavatum 05/18/2011  . Allergy   . Allergic rhinitis, cause unspecified 05/22/2011  . Alcohol dependence in remission (HCC) 05/22/2011  . Personal history of colonic polyps 10/28/2012  . First degree AV block   . SVT (supraventricular tachycardia) (HCC)   . Myocardial infarction [facility])    Past Surgical History  Procedure Laterality Date  . Tonsillectomy and adenoidectomy    . Breast lumpectomy    . US echocardiography  01/17/2003    EF 55-60%  . Cardiovascular stress test  03/08/2009    EF 84%  . Abdominal hysterectomy  1988  . Breast biopsy  1948    reports that she quit smoking about 12 years ago. She does not have any smokeless tobacco history on file. She reports that she does not drink alcohol or use illicit drugs. family history includes Arthritis in her mother; Heart disease in her father;  Heart failure in her mother; Hypertension in her father; Kidney failure in her father. Allergies  Allergen Reactions  . Lisinopril     Unknown Pt states she's never taken Lisinopril  . Pneumovax 23 [Pneumococcal Vac Polyvalent] Other (See Comments)    Flu like symtpoms for 1 day   Current Outpatient Prescriptions on File Prior to Visit  Medication Sig Dispense Refill  . acetaminophen (TYLENOL) 325 MG tablet Take 650 mg by mouth every 6 (six) hours as needed (joint pain).    Marland Kitchen ALPRAZolam (XANAX) 0.25 MG tablet Take 0.25 mg by mouth at bedtime as needed for anxiety.    Marland Kitchen amiodarone (PACERONE) 200 MG tablet Take 200 mg by mouth daily.    Marland Kitchen amLODipine (NORVASC) 2.5 MG tablet TAKE 1 TABLET BY MOUTH EVERY DAY 90 tablet 3  .  Ascorbic Acid (VITAMIN C PO) Take 1 tablet by mouth daily.    Marland Kitchen aspirin 81 MG tablet Take 81 mg by mouth daily.      . B Complex Vitamins (VITAMIN B COMPLEX PO) Take 1 tablet by mouth daily.     . calcium carbonate (OS-CAL) 600 MG tablet Take 600 mg by mouth daily.    Marland Kitchen diltiazem (CARDIZEM CD) 120 MG 24 hr capsule Take 1 capsule by mouth daily.    Marland Kitchen diltiazem (CARDIZEM) 30 MG tablet Take 1-2 tablets by mouth every 6 hours as needed for fast heart rates 30 tablet 1  . ferrous sulfate 325 (65 FE) MG tablet Take 325 mg by mouth daily.     . furosemide (LASIX) 40 MG tablet Take 1 tablet (40 mg total) by mouth daily. 90 tablet 3  . Glucosamine HCl-MSM (GLUCOSAMINE-MSM PO) Take 2 tablets by mouth daily.     . nitroGLYCERIN (NITROSTAT) 0.4 MG SL tablet Place 1 tablet (0.4 mg total) under the tongue every 5 (five) minutes as needed for chest pain. 25 tablet 3  . simvastatin (ZOCOR) 10 MG tablet Take 1 tablet (10 mg total) by mouth daily at 6 PM. 90 tablet 3  . triamcinolone cream (KENALOG) 0.1 % Apply 1 application topically 2 (two) times daily. Apply to affected area    . vitamin E 200 UNIT capsule Take 200 Units by mouth daily.     No current facility-administered medications on file prior to visit.    Review of Systems  Constitutional: Negative for unusual diaphoresis or night sweats HENT: Negative for ringing in ear or discharge Eyes: Negative for double vision or worsening visual disturbance.  Respiratory: Negative for choking and stridor.   Gastrointestinal: Negative for vomiting or other signifcant bowel change Genitourinary: Negative for hematuria or change in urine volume.  Musculoskeletal: Negative for other MSK pain or swelling Skin: Negative for color change and worsening wound.  Neurological: Negative for tremors and numbness other than noted  Psychiatric/Behavioral: Negative for decreased concentration or agitation other than above       Objective:   Physical Exam BP 118/60 mmHg   Pulse 63  Temp(Src) 98.6 F (37 C) (Oral)  Resp 20  Wt 156 lb (70.761 kg)  SpO2 93% BP 118/60 mmHg  Pulse 63  Temp(Src) 98.6 F (37 C) (Oral)  Resp 20  Wt 156 lb (70.761 kg)  SpO2 93% VS noted,  Constitutional: Pt appears in no significant distress HENT: Head: NCAT.  Right Ear: External ear normal.  Left Ear: External ear normal.  Eyes: . Pupils are equal, round, and reactive to light. Conjunctivae and EOM are normal Neck: Normal  range of motion. Neck supple.  Cardiovascular: Normal rate and regular rhythm.   Pulmonary/Chest: Effort normal and breath sounds mild decreased without rales or wheezing.  Abd:  Soft, NT, ND, + BS Neurological: Pt is alert. Not confused , motor grossly intact Skin: Skin is warm. No rash, no LE edema Psychiatric: Pt behavior is normal. No agitation.   Most recent echo:              [facility]*         *[facility]*            [address]            [place], [place] [address]              [phone]  ------------------------------------------------------------------- Transthoracic Echocardiography  Patient:  Hazelle, Woollard MR #:    [id number] Study Date: 07/07/2015 Gender:   F Age:    78 Height:   160 cm Weight:   70.3 kg BSA:    1.79 m^2 Pt. Status: Room:  ATTENDING  Robbie Lis Levonne Hubert, Brittainy M REFERRING  Robbie Lis M PERFORMING  [facility], Outpatient SONOGRAPHER Thurman Coyer  cc:  ------------------------------------------------------------------- LV EF: 55% -  60%  ------------------------------------------------------------------- Indications:   Chest pain 786.51.  ------------------------------------------------------------------- History:  PMH:  Chronic obstructive pulmonary disease. PMH: Myocardial infarction. Risk factors: Former tobacco use. Hypertension.  Dyslipidemia.  ------------------------------------------------------------------- Study Conclusions  - Left ventricle: The cavity size was normal. Wall thickness was increased in a pattern of mild LVH. Systolic function was normal. The estimated ejection fraction was in the range of 55% to 60%. Doppler parameters are consistent with abnormal left ventricular relaxation (grade 1 diastolic dysfunction).    Assessment & Plan:

## 2015-08-02 NOTE — Assessment & Plan Note (Signed)
stable overall by history and exam, recent data reviewed with pt, and pt to continue medical treatment as before,  to f/u any worsening symptoms or concerns Lab Results  Component Value Date   LDLCALC 69 05/24/2015

## 2015-08-02 NOTE — Assessment & Plan Note (Signed)
stable overall by history and exam, recent data reviewed with pt, and pt to continue medical treatment as before,  to f/u any worsening symptoms or concerns BP Readings from Last 3 Encounters:  08/02/15 118/60  07/10/15 138/70  07/05/15 120/66

## 2015-08-02 NOTE — Assessment & Plan Note (Signed)
>>  ASSESSMENT AND PLAN FOR HTN (HYPERTENSION) WRITTEN ON 08/02/2015 11:55 AM BY Corwin Levins, MD  stable overall by history and exam, recent data reviewed with pt, and pt to continue medical treatment as before,  to f/u any worsening symptoms or concerns BP Readings from Last 3 Encounters:  08/02/15 118/60  07/10/15 138/70  07/05/15 120/66

## 2015-08-03 ENCOUNTER — Ambulatory Visit (INDEPENDENT_AMBULATORY_CARE_PROVIDER_SITE_OTHER)
Admission: RE | Admit: 2015-08-03 | Discharge: 2015-08-03 | Disposition: A | Discharge status: Home / Self Care | Payer: Medicare Other | Source: Ambulatory Visit | Attending: Internal Medicine | Admitting: Internal Medicine

## 2015-08-03 DIAGNOSIS — R0600 Dyspnea, unspecified: Secondary | ICD-10-CM

## 2015-08-03 MED ORDER — IOHEXOL 350 MG/ML SOLN: 80.0000 mL | Freq: Once | INTRAVENOUS | Status: DC | PRN

## 2015-08-04 DIAGNOSIS — H353223 Exudative age-related macular degeneration, left eye, with inactive scar: Secondary | ICD-10-CM | POA: Diagnosis not present

## 2015-08-04 DIAGNOSIS — H353211 Exudative age-related macular degeneration, right eye, with active choroidal neovascularization: Secondary | ICD-10-CM | POA: Diagnosis not present

## 2015-08-04 DIAGNOSIS — H43813 Vitreous degeneration, bilateral: Secondary | ICD-10-CM | POA: Diagnosis not present

## 2015-08-04 DIAGNOSIS — H43392 Other vitreous opacities, left eye: Secondary | ICD-10-CM | POA: Diagnosis not present

## 2015-08-08 ENCOUNTER — Telehealth: Payer: Self-pay | Admitting: Pulmonary Disease

## 2015-08-08 ENCOUNTER — Ambulatory Visit (INDEPENDENT_AMBULATORY_CARE_PROVIDER_SITE_OTHER): Payer: Medicare Other | Admitting: Pulmonary Disease

## 2015-08-08 ENCOUNTER — Encounter: Payer: Self-pay | Admitting: Pulmonary Disease

## 2015-08-08 VITALS — BP 120/64 | HR 69 | Ht 63.0 in | Wt 157.8 lb

## 2015-08-08 DIAGNOSIS — R0600 Dyspnea, unspecified: Secondary | ICD-10-CM | POA: Diagnosis not present

## 2015-08-08 DIAGNOSIS — R05 Cough: Secondary | ICD-10-CM

## 2015-08-08 DIAGNOSIS — R0789 Other chest pain: Secondary | ICD-10-CM

## 2015-08-08 DIAGNOSIS — R059 Cough, unspecified: Secondary | ICD-10-CM

## 2015-08-08 MED ORDER — RANITIDINE HCL 75 MG PO TABS: 75.0000 mg | ORAL_TABLET | Freq: Every day | ORAL | Status: DC

## 2015-08-08 NOTE — Patient Instructions (Signed)
   You can stop using Spiriva.  Try using moist heat to help with your chest wall pain. You can also use aspirin, ibuprofen, or other NSAIDs but do so cautiously and only after checking with Dr. Jonny Ruiz as these can affect your kidneys and have other potentially negative side effects.  We will call you with the result of your swallowing test.  Avoid eating within 2 hours of bedtime in case reflux is causing your cough.   I will see you back in 1-2 months but call me if you have any new problems or feel like things are getting worse.  TESTS ORDERED: 1. Barium Swallow

## 2015-08-08 NOTE — Telephone Encounter (Signed)
PFT 07/12/15: FVC 2.29 L (106%) FEV1 1.57 L (99%) FEV1/FVC 0.68 FEF 25-75 0.78 L (81%) no bronchodilator response TLC 3.59 L (73%) RV 52% ERV 342% DLCO uncorrected 52% 05/14/13: FVC 2.34 L (103%) FEV1 1.63 L (97%) FEV1/FVC 0.69 FEF 25-75 0.83 L (76%) no bronchodilator response TLC 4.51 L (92%) ERV 284% DLCO uncorrected 55%  IMAGING CTA CHEST 08/03/15 (personally reviewed by me): Mild lower lobe predominant atelectasis. No other nodule or opacity appreciated. No ulnar emboli. No pleural effusion or thickening. No pericardial effusion. No pathologic mediastinal adenopathy.  CARDIAC TTE (07/07/15): Narrow  LVOT with normal cavity size. Mild LVH. EF 55-60%. Grade 1 diastolic dysfunction. LA & RA normal in size. RV normal in size and function. No aortic regurgitation. No mitral stenosis or regurgitation. No pulmonic regurgitation. Mild tricuspid regurgitation. No pericardial effusion.  LABS 08/02/15 CBC: 9.2/14.4/42.7/390 BMP: 133/4.3/98/28/17/1.09/94/9.5 LFT: 4.2/7.0/0.5/62/18/16

## 2015-08-08 NOTE — Progress Notes (Signed)
Subjective:    Patient ID: Alexandra Reyes, female    DOB: 01-20-1926, 82 y.o.   MRN: [id number]  HPI Patient previously treated with Spiriva for mild COPD. She reports that over the last year she has had more problems with her breathing. She reports she has had SVT for years and long periods of tachycardia through the night. She has been medically managed and ultimately was started on Amiodarone. She reports that her dyspnea seems to have developed over the last couple of years where she is now having trouble walking up hills and mowing her lawn. She doesn't believe that Spiriva helped her dyspnea. She reports a pain under her left breast that radiates into her mid-sternum. This pain is worse with activity. The pain is described it as sharp at times. She reports the pain is relieved with rest. She feels that her bra does make the make the pain worse. She reports her cough seemed to start at the same time as her Amiodarone and seems to be worsening. She reports her cough is mostly in the morning and is productive of a "stringy" mucus. She also describes it as "white & frothy". She denies any wheezing. She reports pneumonia as a child. She has had allergies all her life. Denies any worsening of symptoms with exposure to smoke or perfumes. Denies any nausea, emesis, reflux, or morning brash water taste. No fever, chills, or sweats.   Review of Systems No adenopathy in her neck, groin, or axilla. No dysuria or hematuria. A pertinent 14 point review of systems is negative except as per the history of presenting illness.  Allergies  Allergen Reactions  . Lisinopril     Unknown Pt states she's never taken Lisinopril  . Pneumovax 23 [Pneumococcal Vac Polyvalent] Other (See Comments)    Flu like symtpoms for 1 day    Current Outpatient Prescriptions on File Prior to Visit  Medication Sig Dispense Refill  . acetaminophen (TYLENOL) 325 MG tablet Take 650 mg by mouth every 6 (six) hours as needed (joint  pain).    Marland Kitchen ALPRAZolam (XANAX) 0.25 MG tablet Take 0.25 mg by mouth at bedtime as needed for anxiety.    Marland Kitchen amiodarone (PACERONE) 200 MG tablet Take 200 mg by mouth daily.    Marland Kitchen amLODipine (NORVASC) 2.5 MG tablet TAKE 1 TABLET BY MOUTH EVERY DAY 90 tablet 3  . Ascorbic Acid (VITAMIN C PO) Take 1 tablet by mouth daily. Reported on 08/08/2015    . aspirin 81 MG tablet Take 81 mg by mouth daily.      . B Complex Vitamins (VITAMIN B COMPLEX PO) Take 1 tablet by mouth daily.     . calcium carbonate (OS-CAL) 600 MG tablet Take 600 mg by mouth daily.    Marland Kitchen diltiazem (CARDIZEM CD) 120 MG 24 hr capsule Take 1 capsule by mouth daily.    Marland Kitchen diltiazem (CARDIZEM) 30 MG tablet Take 1-2 tablets by mouth every 6 hours as needed for fast heart rates 30 tablet 1  . ferrous sulfate 325 (65 FE) MG tablet Take 325 mg by mouth daily.     . furosemide (LASIX) 40 MG tablet Take 1 tablet (40 mg total) by mouth daily. 90 tablet 3  . Glucosamine HCl-MSM (GLUCOSAMINE-MSM PO) Take 2 tablets by mouth daily.     . nitroGLYCERIN (NITROSTAT) 0.4 MG SL tablet Place 1 tablet (0.4 mg total) under the tongue every 5 (five) minutes as needed for chest pain. 25 tablet 3  .  simvastatin (ZOCOR) 10 MG tablet Take 1 tablet (10 mg total) by mouth daily at 6 PM. 90 tablet 3  . triamcinolone cream (KENALOG) 0.1 % Apply 1 application topically 2 (two) times daily. Apply to affected area    . vitamin E 200 UNIT capsule Take 200 Units by mouth daily.     No current facility-administered medications on file prior to visit.    Past Medical History  Diagnosis Date  . PAT (paroxysmal atrial tachycardia) (HCC)   . Macular degeneration   . OA (osteoarthritis)   . Hiatal hernia   . GERD (gastroesophageal reflux disease)   . Cholelithiasis   . COPD (chronic obstructive pulmonary disease) (HCC)   . HTN (hypertension) 05/18/2011  . Hyperlipidemia 05/18/2011  . Osteoporosis 05/18/2011  . Anemia, iron deficiency 05/18/2011  . DDD (degenerative disc  disease), lumbar 05/18/2011  . Pectus excavatum 05/18/2011  . Allergy   . Allergic rhinitis, cause unspecified 05/22/2011  . Alcohol dependence in remission (HCC) 05/22/2011  . Personal history of colonic polyps 10/28/2012  . First degree AV block   . SVT (supraventricular tachycardia) (HCC)   . Myocardial infarction [facility])     Past Surgical History  Procedure Laterality Date  . Tonsillectomy and adenoidectomy    . Breast lumpectomy    . US echocardiography  01/17/2003    EF 55-60%  . Cardiovascular stress test  03/08/2009    EF 84%  . Abdominal hysterectomy  1988  . Breast biopsy  1948    Family History  Problem Relation Age of Onset  . Heart failure Mother   . Arthritis Mother   . Kidney failure Father   . Heart disease Father   . Hypertension Father     Social History   Social History  . Marital Status: Widowed    Spouse Name: N/A  . Number of Children: Y  . Years of Education: 11   Occupational History  . Retired Designer, jewellery    Social History Main Topics  . Smoking status: Former Smoker -- 1.50 packs/day for 36 years    Types: Cigarettes    Start date: 03/01/1959    Quit date: 06/10/1994  . Smokeless tobacco: None     Comment: smoked 1- 1.5 ppd.   . Alcohol Use: No  . Drug Use: No  . Sexual Activity: Not Asked   Other Topics Concern  . None   Social History Narrative   Originally from [place], [place]. She moved to [place] in 1950 during the polio epidemic. 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 Remote bird exposure. No mold or hot tub exposure.       Objective:   Physical Exam BP 120/64 mmHg  Pulse 69  Ht 5\' 3"  (1.6 m)  Wt 157 lb 12.8 oz (71.578 kg)  BMI 27.96 kg/m2  SpO2 96% General:  Awake. Alert. No acute distress. Elderly female.  Integument:  Warm & dry. No rash on exposed skin. No bruising. Lymphatics:  No appreciated cervical or supraclavicular lymphadenoapthy. HEENT:  Moist mucus membranes. No oral ulcers. No scleral injection or  icterus. Moderate bilateral nasal turbinate swelling. Cardiovascular:  Regular rate. Trace edema. No appreciable JVD.  Pulmonary:  Good aeration & clear to auscultation bilaterally. Symmetric chest wall expansion. No accessory muscle use on room air. Abdomen: Soft. Normal bowel sounds. Protuberant. Grossly nontender. Musculoskeletal:  Normal bulk and tone. Hand grip strength 5/5 bilaterally. No joint deformity or effusion appreciated. No chest wall deformity. Pain with palpation  of the anterior sternum as well as left lower rib cage which is reproducible. Neurological:  CN 2-12 grossly in tact. No meningismus. Moving all 4 extremities equally. Symmetric brachioradialis deep tendon reflexes. Psychiatric:  Mood and affect congruent. Speech normal rhythm, rate & tone.   PFT 07/12/15: FVC 2.29 L (106%) FEV1 1.57 L (99%) FEV1/FVC 0.68 FEF 25-75 0.78 L (81%) no bronchodilator response TLC 3.59 L (73%) RV 52% ERV 342% DLCO uncorrected 52% 05/14/13: FVC 2.34 L (103%) FEV1 1.63 L (97%) FEV1/FVC 0.69 FEF 25-75 0.83 L (76%) no bronchodilator response TLC 4.51 L (92%) ERV 284% DLCO uncorrected 55%  IMAGING CTA CHEST 08/03/15 (personally reviewed by me): Mild lower lobe predominant atelectasis. No other nodule or opacity appreciated. No ulnar emboli. No pleural effusion or thickening. No pericardial effusion. No pathologic mediastinal adenopathy.  CARDIAC TTE (07/07/15): Narrow LVOT with normal cavity size. Mild LVH. EF 55-60%. Grade 1 diastolic dysfunction. LA & RA normal in size. RV normal in size and function. No aortic regurgitation. No mitral stenosis or regurgitation. No pulmonic regurgitation. Mild tricuspid regurgitation. No pericardial effusion.  LABS 08/02/15 CBC: 9.2/14.4/42.7/390 BMP: 133/4.3/98/28/17/1.09/94/9.5 LFT: 4.2/7.0/0.5/62/18/16    Assessment & Plan:  82 year old female with seemingly progressive dyspnea as well as morning cough. I reviewed the patient's prior pulmonary function  testing which does show a natural and physiologic rate of decline. There is mild airways obstruction on spirometry but this could simply be physiologic for her AIDS. She does have mild restriction as well on her limited lung volumes from this month but I suspect this is somewhat confounded by her chest wall pain and physical limitation with deep inspiration. The chest wall pain is likely musculoskeletal in origin and due to repetitive motions that she has herself previously reported. I do question whether or not she may be having some element of silent laryngo-esophageal reflux contributing to her morning cough and perhaps some of her symptoms of dyspnea. I instructed the patient to contact my office for any new breathing problems or clinical worsening before her next appointment.  1. Dyspnea: Likely multifactorial. Discontinuing Spiriva. Holding off on further testing at this time. 2. Cough: Empiric treatment for silent laryngo-esophageal reflux with Zantac 75 mg by mouth daily at bedtime. Checking barium swallow. 3. Atypical chest pain: Musculoskeletal in origin. Recommended moist heat as well as cautious use of NSAIDs if okay with primary care physician. 4. Follow-up: Patient to return to clinic in 1-2 months or sooner if needed.  Donna Christen Jamison Neighbor, M.D. [facility] Pager:  [phone] After 3pm or if no response, call [phone] 5:06 PM 08/08/2015

## 2015-08-10 ENCOUNTER — Telehealth: Payer: Self-pay | Admitting: Pulmonary Disease

## 2015-08-10 ENCOUNTER — Telehealth (HOSPITAL_COMMUNITY): Payer: Self-pay | Admitting: Pulmonary Disease

## 2015-08-10 NOTE — Telephone Encounter (Signed)
Error.Stanley A Dalton ° °

## 2015-08-11 ENCOUNTER — Ambulatory Visit (HOSPITAL_COMMUNITY)
Admission: RE | Admit: 2015-08-11 | Discharge: 2015-08-11 | Disposition: A | Discharge status: Home / Self Care | Payer: Medicare Other | Source: Ambulatory Visit | Attending: Pulmonary Disease | Admitting: Pulmonary Disease

## 2015-08-11 ENCOUNTER — Ambulatory Visit (HOSPITAL_COMMUNITY): Admission: RE | Admit: 2015-08-11 | Payer: Medicare Other | Source: Ambulatory Visit

## 2015-08-11 DIAGNOSIS — K224 Dyskinesia of esophagus: Secondary | ICD-10-CM | POA: Diagnosis not present

## 2015-08-11 DIAGNOSIS — K219 Gastro-esophageal reflux disease without esophagitis: Secondary | ICD-10-CM | POA: Insufficient documentation

## 2015-08-11 DIAGNOSIS — R05 Cough: Secondary | ICD-10-CM | POA: Diagnosis not present

## 2015-08-11 DIAGNOSIS — R059 Cough, unspecified: Secondary | ICD-10-CM

## 2015-08-16 DIAGNOSIS — F4329 Adjustment disorder with other symptoms: Secondary | ICD-10-CM | POA: Diagnosis not present

## 2015-08-28 ENCOUNTER — Ambulatory Visit (INDEPENDENT_AMBULATORY_CARE_PROVIDER_SITE_OTHER): Payer: Medicare Other | Admitting: Internal Medicine

## 2015-08-28 ENCOUNTER — Encounter: Payer: Self-pay | Admitting: Internal Medicine

## 2015-08-28 VITALS — BP 132/68 | HR 76 | Ht 63.0 in | Wt 156.0 lb

## 2015-08-28 DIAGNOSIS — I10 Essential (primary) hypertension: Secondary | ICD-10-CM

## 2015-08-28 DIAGNOSIS — I471 Supraventricular tachycardia: Secondary | ICD-10-CM

## 2015-08-28 NOTE — Progress Notes (Signed)
Electrophysiology Office Note   Date:  08/28/2015   ID:  Alexandra Reyes, DOB 1925-10-23, MRN [id number]  PCP:  Oliver BarreJames [name], MD  Cardiologist:  Dr Anne FuSkains, (previously [name]) Primary Electrophysiologist: Alexandra RangeJames Ladashia Demarinis, MD    Chief Complaint  Patient presents with  . Follow-up     History of Present Illness: Alexandra Reyes is a 82 y.o. female who presents today for electrophysiology evaluation.  Atach is much improved.  She continues to have occasional exertional SOB for which she is now following with pulmonary.  Chest wall pain is also stable.   Past Medical History  Diagnosis Date  . PAT (paroxysmal atrial tachycardia) (HCC)   . Macular degeneration   . OA (osteoarthritis)   . Hiatal hernia   . GERD (gastroesophageal reflux disease)   . Cholelithiasis   . COPD (chronic obstructive pulmonary disease) (HCC)   . HTN (hypertension) 05/18/2011  . Hyperlipidemia 05/18/2011  . Osteoporosis 05/18/2011  . Anemia, iron deficiency 05/18/2011  . DDD (degenerative disc disease), lumbar 05/18/2011  . Pectus excavatum 05/18/2011  . Allergy   . Allergic rhinitis, cause unspecified 05/22/2011  . Alcohol dependence in remission (HCC) 05/22/2011  . Personal history of colonic polyps 10/28/2012  . First degree AV block   . SVT (supraventricular tachycardia) (HCC)   . Myocardial infarction [facility](HCC)    Past Surgical History  Procedure Laterality Date  . Tonsillectomy and adenoidectomy    . Breast lumpectomy    . [place] echocardiography  01/17/2003    EF 55-60%  . Cardiovascular stress test  03/08/2009    EF 84%  . Abdominal hysterectomy  1988  . Breast biopsy  1948     Current Outpatient Prescriptions  Medication Sig Dispense Refill  . acetaminophen (TYLENOL) 325 MG tablet Take 650 mg by mouth every 6 (six) hours as needed (joint pain).    Marland Kitchen. ALPRAZolam (XANAX) 0.25 MG tablet Take 0.25 mg by mouth at bedtime as needed for anxiety.    Marland Kitchen. amiodarone (PACERONE) 200 MG tablet Take 200 mg by  mouth daily.    Marland Kitchen. amLODipine (NORVASC) 2.5 MG tablet TAKE 1 TABLET BY MOUTH EVERY DAY 90 tablet 3  . Ascorbic Acid (VITAMIN C PO) Take 1 tablet by mouth daily. Reported on 08/08/2015    . aspirin 81 MG tablet Take 81 mg by mouth daily.      . B Complex Vitamins (VITAMIN B COMPLEX PO) Take 1 tablet by mouth daily.     . calcium carbonate (OS-CAL) 600 MG tablet Take 600 mg by mouth daily.    Marland Kitchen. diltiazem (CARDIZEM CD) 120 MG 24 hr capsule Take 1 capsule by mouth daily.    Marland Kitchen. diltiazem (CARDIZEM) 30 MG tablet Take 1-2 tablets by mouth every 6 hours as needed for fast heart rates 30 tablet 1  . ferrous sulfate 325 (65 FE) MG tablet Take 325 mg by mouth daily.     . furosemide (LASIX) 40 MG tablet Take 1 tablet (40 mg total) by mouth daily. 90 tablet 3  . Glucosamine HCl-MSM (GLUCOSAMINE-MSM PO) Take 2 tablets by mouth daily.     . nitroGLYCERIN (NITROSTAT) 0.4 MG SL tablet Place 1 tablet (0.4 mg total) under the tongue every 5 (five) minutes as needed for chest pain. 25 tablet 3  . ranitidine (ZANTAC 75) 75 MG tablet Take 1 tablet (75 mg total) by mouth at bedtime. 30 tablet 3  . simvastatin (ZOCOR) 10 MG tablet Take 1 tablet (10 mg total) by  mouth daily at 6 PM. 90 tablet 3  . triamcinolone cream (KENALOG) 0.1 % Apply 1 application topically 2 (two) times daily. Apply to affected area    . vitamin E 200 UNIT capsule Take 200 Units by mouth daily.     No current facility-administered medications for this visit.    Allergies:   Lisinopril and Pneumovax 23   Social History:  The patient  reports that she quit smoking about 21 years ago. Her smoking use included Cigarettes. She started smoking about 56 years ago. She has a 54 pack-year smoking history. She does not have any smokeless tobacco history on file. She reports that she does not drink alcohol or use illicit drugs.   Family History:  The patient's family history includes Arthritis in her mother; Heart disease in her father; Heart failure in her  mother; Hypertension in her father; Kidney failure in her father.    ROS:  Please see the history of present illness.   All other systems are reviewed and negative.    PHYSICAL EXAM: VS:  BP 132/68 mmHg  Pulse 76  Ht  (1.6 m)  Wt 156 lb (70.761 kg)  BMI 27.64 kg/m2 , BMI Body mass index is 27.64 kg/(m^2). GEN: elderly, in no acute distress HEENT: normal Neck: no JVD, carotid bruits, or masses Cardiac: RRR; no murmurs, rubs, or gallops,+1 edema , support hose in place Respiratory:  clear to auscultation bilaterally, normal work of breathing GI: soft, nontender, nondistended, + BS MS: no deformity or atrophy Skin: warm and dry  Neuro:  Strength and sensation are intact Psych: euthymic mood, full affect  EKG:  Ordered today reveals sinus rhythm 76 bpm, PR 324 msec, anteroseptal infarct, inferior infarct   Recent Labs: 05/24/2015: TSH 3.969 08/02/2015: ALT 16; BUN 17; Creatinine, Ser 1.09; Hemoglobin 14.4; Platelets 390.0; Potassium 4.3; Sodium 133*    Lipid Panel     Component Value Date/Time   CHOL 167 05/24/2015 0831   TRIG 71 05/24/2015 0831   HDL 84 05/24/2015 0831   CHOLHDL 2.0 05/24/2015 0831   VLDL 14 05/24/2015 0831   LDLCALC 69 05/24/2015 0831   LDLDIRECT 129.2 05/15/2011 0925     Wt Readings from Last 3 Encounters:  08/28/15 156 lb (70.761 kg)  08/08/15 157 lb 12.8 oz (71.578 kg)  08/02/15 156 lb (70.761 kg)     ASSESSMENT AND PLAN:  1.  SVT Long RP,  Likely atach with first degree AV block though I cannot exclude a reentrant arrhythmia.  Much improved with amiodarone  2. HTN Stable No change required today  3. Venous insufficiency Support hose  4. SOB/ chest discomfort myoview and PFTs again reviewed with the patient today Continues to follow with pulmonary   Return to see me in 6 weeks Follow-up with Dr Anne Fu as scheduled  Signed, Alexandra Range, MD  08/28/2015 10:41 PM     [facility] [address] [place] [address] [phone] (office) [phone] (fax)

## 2015-08-28 NOTE — Patient Instructions (Signed)
Medication Instructions:  Your physician recommends that you continue on your current medications as directed. Please refer to the Current Medication list given to you today.   Labwork: None ordered   Testing/Procedures: None ordered   Follow-Up: Your physician recommends that you schedule a follow-up appointment in: 6 weeks with Dr Allred   Any Other Special Instructions Will Be Listed Below (If Applicable).     If you need a refill on your cardiac medications before your next appointment, please call your pharmacy.   

## 2015-08-30 ENCOUNTER — Telehealth: Payer: Self-pay | Admitting: Internal Medicine

## 2015-08-30 ENCOUNTER — Encounter: Payer: Self-pay | Admitting: Internal Medicine

## 2015-08-30 ENCOUNTER — Ambulatory Visit (INDEPENDENT_AMBULATORY_CARE_PROVIDER_SITE_OTHER): Payer: Medicare Other | Admitting: Internal Medicine

## 2015-08-30 VITALS — BP 116/60 | HR 105 | Temp 98.2°F | Resp 20 | Wt 159.0 lb

## 2015-08-30 DIAGNOSIS — I10 Essential (primary) hypertension: Secondary | ICD-10-CM | POA: Diagnosis not present

## 2015-08-30 DIAGNOSIS — G588 Other specified mononeuropathies: Secondary | ICD-10-CM | POA: Insufficient documentation

## 2015-08-30 DIAGNOSIS — R079 Chest pain, unspecified: Secondary | ICD-10-CM

## 2015-08-30 DIAGNOSIS — R0609 Other forms of dyspnea: Secondary | ICD-10-CM | POA: Diagnosis not present

## 2015-08-30 NOTE — Assessment & Plan Note (Signed)
>>  ASSESSMENT AND PLAN FOR HTN (HYPERTENSION) WRITTEN ON 08/30/2015 10:54 AM BY Corwin Levins, MD  stable overall by history and exam, recent data reviewed with pt, and pt to continue medical treatment as before,  to f/u any worsening symptoms or concerns BP Readings from Last 3 Encounters:  08/30/15 116/60  08/28/15 132/68  08/08/15 120/64

## 2015-08-30 NOTE — Telephone Encounter (Signed)
Error the call was [facility] the [facility]uler [facility]

## 2015-08-30 NOTE — Assessment & Plan Note (Signed)
No clinical evidence of volume or infectious or metabolic etiologies, has better cough on zantac, to cont fu with pulm as planned

## 2015-08-30 NOTE — Progress Notes (Signed)
Pre visit review using our clinic review tool, if applicable. No additional management support is needed unless otherwise documented below in the visit note. 

## 2015-08-30 NOTE — Patient Instructions (Signed)
Please continue all other medications as before, and refills have been done if requested.  Please have the pharmacy call with any other refills you may need.  Please continue your efforts at being more active, low cholesterol diet, and weight control.  You are otherwise up to date with prevention measures today.  Please keep your appointments with your specialists as you may have planned  Please return in 6 months, or sooner if needed 

## 2015-08-30 NOTE — Progress Notes (Signed)
Subjective:    Patient ID: Alexandra Reyes, female    DOB: 1925/06/24, 82 y.o.   MRN: [id number]  HPI  Here to f/u, has seen pulm with dx of multifactorial dyspnea, has been able to stop the spiriva, still with dyspnea and atypical cp's, did have mod esoph dysmotility and reflux, overall some better nearly resolved with the zantac.  Recent stress test neg.  Has seen Dr Allred/card as well, on amio and to cont this. Pt denies chest pain, increased sob or doe, wheezing, orthopnea, PND, increased LE swelling, palpitations, dizziness or syncope except for the above.  "finally got some sympathy from somebody" after d/w dr allred. Plans to f/u with pulm next month as well.  Moist heat has helped the chest pains, but still recurs, and still has the exertional dyspnea has to sit down with going to the parking lot.  "why wont someone just come out and tell me Im getting old? And "what is this about chest wall thickening I was told by Dr [name]?" Past Medical History  Diagnosis Date  . PAT (paroxysmal atrial tachycardia) (HCC)   . Macular degeneration   . OA (osteoarthritis)   . Hiatal hernia   . GERD (gastroesophageal reflux disease)   . Cholelithiasis   . COPD (chronic obstructive pulmonary disease) (HCC)   . HTN (hypertension) 05/18/2011  . Hyperlipidemia 05/18/2011  . Osteoporosis 05/18/2011  . Anemia, iron deficiency 05/18/2011  . DDD (degenerative disc disease), lumbar 05/18/2011  . Pectus excavatum 05/18/2011  . Allergy   . Allergic rhinitis, cause unspecified 05/22/2011  . Alcohol dependence in remission (HCC) 05/22/2011  . Personal history of colonic polyps 10/28/2012  . First degree AV block   . SVT (supraventricular tachycardia) (HCC)   . Myocardial infarction [facility](HCC)    Past Surgical History  Procedure Laterality Date  . Tonsillectomy and adenoidectomy    . Breast lumpectomy    . [place] echocardiography  01/17/2003    EF 55-60%  . Cardiovascular stress test  03/08/2009    EF 84%  . Abdominal  hysterectomy  1988  . Breast biopsy  1948    reports that she quit smoking about 21 years ago. Her smoking use included Cigarettes. She started smoking about 56 years ago. She has a 54 pack-year smoking history. She does not have any smokeless tobacco history on file. She reports that she does not drink alcohol or use illicit drugs. family history includes Arthritis in her mother; Heart disease in her father; Heart failure in her mother; Hypertension in her father; Kidney failure in her father. Allergies  Allergen Reactions  . Lisinopril     Unknown Pt states she's never taken Lisinopril  . Pneumovax 23 [Pneumococcal Vac Polyvalent] Other (See Comments)    Flu like symtpoms for 1 day   Current Outpatient Prescriptions on File Prior to Visit  Medication Sig Dispense Refill  . acetaminophen (TYLENOL) 325 MG tablet Take 650 mg by mouth every 6 (six) hours as needed (joint pain).    Marland Kitchen. ALPRAZolam (XANAX) 0.25 MG tablet Take 0.25 mg by mouth at bedtime as needed for anxiety.    Marland Kitchen. amiodarone (PACERONE) 200 MG tablet Take 200 mg by mouth daily.    Marland Kitchen. amLODipine (NORVASC) 2.5 MG tablet TAKE 1 TABLET BY MOUTH EVERY DAY 90 tablet 3  . Ascorbic Acid (VITAMIN C PO) Take 1 tablet by mouth daily. Reported on 08/08/2015    . aspirin 81 MG tablet Take 81 mg by mouth daily.      .Marland Kitchen  B Complex Vitamins (VITAMIN B COMPLEX PO) Take 1 tablet by mouth daily.     . calcium carbonate (OS-CAL) 600 MG tablet Take 600 mg by mouth daily.    Marland Kitchen diltiazem (CARDIZEM CD) 120 MG 24 hr capsule Take 1 capsule by mouth daily.    Marland Kitchen diltiazem (CARDIZEM) 30 MG tablet Take 1-2 tablets by mouth every 6 hours as needed for fast heart rates 30 tablet 1  . ferrous sulfate 325 (65 FE) MG tablet Take 325 mg by mouth daily.     . furosemide (LASIX) 40 MG tablet Take 1 tablet (40 mg total) by mouth daily. 90 tablet 3  . Glucosamine HCl-MSM (GLUCOSAMINE-MSM PO) Take 2 tablets by mouth daily.     . nitroGLYCERIN (NITROSTAT) 0.4 MG SL tablet  Place 1 tablet (0.4 mg total) under the tongue every 5 (five) minutes as needed for chest pain. 25 tablet 3  . ranitidine (ZANTAC 75) 75 MG tablet Take 1 tablet (75 mg total) by mouth at bedtime. 30 tablet 3  . simvastatin (ZOCOR) 10 MG tablet Take 1 tablet (10 mg total) by mouth daily at 6 PM. 90 tablet 3  . triamcinolone cream (KENALOG) 0.1 % Apply 1 application topically 2 (two) times daily. Apply to affected area    . vitamin E 200 UNIT capsule Take 200 Units by mouth daily.     No current facility-administered medications on file prior to visit.     Review of Systems  Constitutional: Negative for unusual diaphoresis or night sweats HENT: Negative for ringing in ear or discharge Eyes: Negative for double vision or worsening visual disturbance.  Respiratory: Negative for choking and stridor.   Gastrointestinal: Negative for vomiting or other signifcant bowel change Genitourinary: Negative for hematuria or change in urine volume.  Musculoskeletal: Negative for other MSK pain or swelling Skin: Negative for color change and worsening wound.  Neurological: Negative for tremors and numbness other than noted  Psychiatric/Behavioral: Negative for decreased concentration or agitation other than above       Objective:   Physical Exam BP 116/60 mmHg  Pulse 105  Temp(Src) 98.2 F (36.8 C) (Oral)  Resp 20  Wt 159 lb (72.122 kg)  SpO2 90% - VS taken just after walking in  VS noted,  Constitutional: Pt appears in no significant distress HENT: Head: NCAT.  Right Ear: External ear normal.  Left Ear: External ear normal.  Eyes: . Pupils are equal, round, and reactive to light. Conjunctivae and EOM are normal Neck: Normal range of motion. Neck supple.  Cardiovascular: Normal rate and regular rhythm.   Pulmonary/Chest: Effort normal and breath sounds without rales or wheezing.  Abd:  Soft, NT, ND, + BS Neurological: Pt is alert. Not confused , motor grossly intact Skin: Skin is warm. No  rash, no LE edema Psychiatric: Pt behavior is normal. No agitation.     Assessment & Plan:

## 2015-08-30 NOTE — Assessment & Plan Note (Signed)
Atypical, ? Msk, ok to follow, recent stress test and CTA chest neg for acute,  to f/u any worsening symptoms or concerns

## 2015-08-30 NOTE — Assessment & Plan Note (Signed)
stable overall by history and exam, recent data reviewed with pt, and pt to continue medical treatment as before,  to f/u any worsening symptoms or concerns BP Readings from Last 3 Encounters:  08/30/15 116/60  08/28/15 132/68  08/08/15 120/64

## 2015-09-01 NOTE — Addendum Note (Signed)
Addended by: Reesa ChewJONES, Cheney Ewart G on: 09/01/2015 09:03 AM   Modules accepted: Orders

## 2015-09-13 DIAGNOSIS — F4329 Adjustment disorder with other symptoms: Secondary | ICD-10-CM | POA: Diagnosis not present

## 2015-09-20 DIAGNOSIS — F4329 Adjustment disorder with other symptoms: Secondary | ICD-10-CM | POA: Diagnosis not present

## 2015-09-26 ENCOUNTER — Ambulatory Visit: Payer: Medicare Other | Admitting: Emergency Medicine

## 2015-10-04 ENCOUNTER — Ambulatory Visit (INDEPENDENT_AMBULATORY_CARE_PROVIDER_SITE_OTHER): Payer: Medicare Other | Admitting: Pulmonary Disease

## 2015-10-04 ENCOUNTER — Encounter: Payer: Self-pay | Admitting: Pulmonary Disease

## 2015-10-04 VITALS — BP 132/60 | HR 58 | Ht 63.0 in | Wt 155.0 lb

## 2015-10-04 DIAGNOSIS — R059 Cough, unspecified: Secondary | ICD-10-CM

## 2015-10-04 DIAGNOSIS — K219 Gastro-esophageal reflux disease without esophagitis: Secondary | ICD-10-CM | POA: Diagnosis not present

## 2015-10-04 DIAGNOSIS — R05 Cough: Secondary | ICD-10-CM | POA: Diagnosis not present

## 2015-10-04 DIAGNOSIS — R0600 Dyspnea, unspecified: Secondary | ICD-10-CM | POA: Diagnosis not present

## 2015-10-04 DIAGNOSIS — R0789 Other chest pain: Secondary | ICD-10-CM

## 2015-10-04 NOTE — Progress Notes (Signed)
Subjective:    Patient ID: Alexandra Reyes, female    DOB: 22-Aug-1925, 82 y.o.   MRN: [id number]  C.C.:  Follow-up for Dyspnea, Cough, GERD, & Chest Wall Pain.  HPI Dyspnea: Likely multifactorial. Patient previously treated with Spiriva which was stopped at last appointment. This has been present for 2 years and is no worse off Spiriva. She reports dyspnea with her shower.   Cough: Suspect secondary to underlying reflux. Started on empiric Zantac at last appointment. She reports that her cough has improved on her current dose of Zantac but not totally resolved.   GERD: Found on barium swallow in the lower third of the esophagus. Also found to have moderate esophageal dysmotility. Started on Zantac at last appointment.  Chest Wall Pain:  She reports she feels her pain is better when laying recumbent. She has been using heat to help her pain. She has been using Tramadol for symptomatic pain. She denies any pleurisy. She does feel the pain worsens as the day progresses. Denies any trauma.   Review of Systems She denies any palpitations. No fever, chills, or sweats. No rashes or bruising. Denies any new joint pain or swelling.   Allergies  Allergen Reactions  . Lisinopril     Unknown Pt states she's never taken Lisinopril  . Pneumovax 23 [Pneumococcal Vac Polyvalent] Other (See Comments)    Flu like symtpoms for 1 day    Current Outpatient Prescriptions on File Prior to Visit  Medication Sig Dispense Refill  . acetaminophen (TYLENOL) 325 MG tablet Take 650 mg by mouth every 6 (six) hours as needed (joint pain).    Marland Kitchen ALPRAZolam (XANAX) 0.25 MG tablet Take 0.25 mg by mouth at bedtime as needed for anxiety.    Marland Kitchen amiodarone (PACERONE) 200 MG tablet Take 200 mg by mouth daily.    Marland Kitchen amLODipine (NORVASC) 2.5 MG tablet TAKE 1 TABLET BY MOUTH EVERY DAY 90 tablet 3  . Ascorbic Acid (VITAMIN C PO) Take 1 tablet by mouth daily. Reported on 08/08/2015    . aspirin 81 MG tablet Take 81 mg by mouth  daily.      . B Complex Vitamins (VITAMIN B COMPLEX PO) Take 1 tablet by mouth daily.     . calcium carbonate (OS-CAL) 600 MG tablet Take 600 mg by mouth daily.    Marland Kitchen diltiazem (CARDIZEM CD) 120 MG 24 hr capsule Take 1 capsule by mouth daily.    Marland Kitchen diltiazem (CARDIZEM) 30 MG tablet Take 1-2 tablets by mouth every 6 hours as needed for fast heart rates 30 tablet 1  . ferrous sulfate 325 (65 FE) MG tablet Take 325 mg by mouth daily.     . furosemide (LASIX) 40 MG tablet Take 1 tablet (40 mg total) by mouth daily. 90 tablet 3  . Glucosamine HCl-MSM (GLUCOSAMINE-MSM PO) Take 2 tablets by mouth daily.     . nitroGLYCERIN (NITROSTAT) 0.4 MG SL tablet Place 1 tablet (0.4 mg total) under the tongue every 5 (five) minutes as needed for chest pain. 25 tablet 3  . ranitidine (ZANTAC 75) 75 MG tablet Take 1 tablet (75 mg total) by mouth at bedtime. 30 tablet 3  . simvastatin (ZOCOR) 10 MG tablet Take 1 tablet (10 mg total) by mouth daily at 6 PM. 90 tablet 3  . triamcinolone cream (KENALOG) 0.1 % Apply 1 application topically 2 (two) times daily. Apply to affected area    . vitamin E 200 UNIT capsule Take 200 Units by mouth  daily.     No current facility-administered medications on file prior to visit.    Past Medical History  Diagnosis Date  . PAT (paroxysmal atrial tachycardia) (HCC)   . Macular degeneration   . OA (osteoarthritis)   . Hiatal hernia   . GERD (gastroesophageal reflux disease)   . Cholelithiasis   . COPD (chronic obstructive pulmonary disease) (HCC)   . HTN (hypertension) 05/18/2011  . Hyperlipidemia 05/18/2011  . Osteoporosis 05/18/2011  . Anemia, iron deficiency 05/18/2011  . DDD (degenerative disc disease), lumbar 05/18/2011  . Pectus excavatum 05/18/2011  . Allergy   . Allergic rhinitis, cause unspecified 05/22/2011  . Alcohol dependence in remission (HCC) 05/22/2011  . Personal history of colonic polyps 10/28/2012  . First degree AV block   . SVT (supraventricular tachycardia)  (HCC)   . Myocardial infarction [facility])     Past Surgical History  Procedure Laterality Date  . Tonsillectomy and adenoidectomy    . Breast lumpectomy    . US echocardiography  01/17/2003    EF 55-60%  . Cardiovascular stress test  03/08/2009    EF 84%  . Abdominal hysterectomy  1988  . Breast biopsy  1948    Family History  Problem Relation Age of Onset  . Heart failure Mother   . Arthritis Mother   . Kidney failure Father   . Heart disease Father   . Hypertension Father     Social History   Social History  . Marital Status: Widowed    Spouse Name: N/A  . Number of Children: Y  . Years of Education: 11   Occupational History  . Retired Designer, jewellery    Social History Main Topics  . Smoking status: Former Smoker -- 1.50 packs/day for 36 years    Types: Cigarettes    Start date: 03/01/1959    Quit date: 06/10/1994  . Smokeless tobacco: Never Used     Comment: smoked 1- 1.5 ppd.   . Alcohol Use: No  . Drug Use: No  . Sexual Activity: Not Asked   Other Topics Concern  . None   Social History Narrative   Originally from [place], [place]. She moved to [place] in 1950 during the polio epidemic. 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 Remote bird exposure. No mold or hot tub exposure.       Objective:   Physical Exam BP 132/60 mmHg  Pulse 58  Ht  (1.6 m)  Wt 155 lb (70.308 kg)  BMI 27.46 kg/m2  SpO2 97% General:  Awake. Alert. Elderly female.  Integument:  Warm & dry. No rash on exposed skin.  Lymphatics:  No appreciated cervical or supraclavicular lymphadenoapthy. HEENT:  Moist mucus membranes. No oral ulcers. Moderate bilateral nasal turbinate swellingPersists. Cardiovascular:  Regular rate. No edema. No appreciable JVD.  Pulmonary:  Clear bilaterally to auscultation. Speaking in clear sentences. Normal work of breathing on room air. Musculoskeletal: No chest wall deformity. No pain with palpation of thoracic or lumbar spine. No pain with palpation  of lateral or posterior rib cage. Mild discomfort with palpation of lower sternum.  PFT 07/12/15: FVC 2.29 L (106%) FEV1 1.57 L (99%) FEV1/FVC 0.68 FEF 25-75 0.78 L (81%) no bronchodilator response TLC 3.59 L (73%) RV 52% ERV 342% DLCO uncorrected 52% 05/14/13: FVC 2.34 L (103%) FEV1 1.63 L (97%) FEV1/FVC 0.69 FEF 25-75 0.83 L (76%) no bronchodilator response TLC 4.51 L (92%) ERV 284% DLCO uncorrected 55%  IMAGING BARIUM SWALLOW (08/11/15): No evidence  for stricture or mass lesion. Moderate esophageal dysmotility without hiatal hernia. Small amount spontaneous reflux into the lower third of the esophagus.  CTA CHEST 08/03/15 (previously reviewed by me): Mild lower lobe predominant atelectasis. No other nodule or opacity appreciated. No ulnar emboli. No pleural effusion or thickening. No pericardial effusion. No pathologic mediastinal adenopathy.  CARDIAC TTE (07/07/15): Narrow LVOT with normal cavity size. Mild LVH. EF 55-60%. Grade 1 diastolic dysfunction. LA & RA normal in size. RV normal in size and function. No aortic regurgitation. No mitral stenosis or regurgitation. No pulmonic regurgitation. Mild tricuspid regurgitation. No pericardial effusion.  LABS 08/02/15 CBC: 9.2/14.4/42.7/390 BMP: 133/4.3/98/28/17/1.09/94/9.5 LFT: 4.2/7.0/0.5/62/18/16    Assessment & Plan:  82 year old female with ongoing chest wall pain that is of unclear etiology. I believe this is contributing to some of her dyspnea. Cough is improving with reflux treatment and I reviewed her CTA of the chest with her today as well as her barium swallow results. I am increasing her Zantac to help improve her cough suppression and reflux. I believe that further chest imaging is warranted given ongoing pain. I instructed the patient contact my office for any new breathing problems before her next appointment or questions.  1. Chest pain: Seems to be musculoskeletal in etiology. Checking CT chest without contrast.  2. Cough: Likely  secondary to GERD.  3. GERD: Increasing Zantac to 150 mg by mouth daily at bedtime.  4. Dyspnea: Multifactorial. Checking CT scan of chest without contrast. 5. Follow-up: Patient to return to clinic in 4-6 weeks or sooner if needed.  Donna ChristenJennings E. [name], M.D. [facility] Pager:  [phone] After 3pm or if no response, call [phone] 10:29 AM 10/04/2015

## 2015-10-04 NOTE — Patient Instructions (Signed)
   Increase Zantac to 150mg  at night before bed.  We will do a CT scan of your chest to better evaluate where this pain may be coming from.  I will see you back in 4-6 weeks but will contact you with the results of your CT scan before then. Call me if you have any new breathing problems or questions before your next appointment.  TESTS ORDERED: 1. CT chest without contrast

## 2015-10-09 ENCOUNTER — Ambulatory Visit (INDEPENDENT_AMBULATORY_CARE_PROVIDER_SITE_OTHER): Payer: Medicare Other | Admitting: Internal Medicine

## 2015-10-09 ENCOUNTER — Inpatient Hospital Stay: Admission: RE | Admit: 2015-10-09 | Payer: Medicare Other | Source: Ambulatory Visit

## 2015-10-09 ENCOUNTER — Ambulatory Visit (INDEPENDENT_AMBULATORY_CARE_PROVIDER_SITE_OTHER)
Admission: RE | Admit: 2015-10-09 | Discharge: 2015-10-09 | Disposition: A | Discharge status: Home / Self Care | Payer: Medicare Other | Source: Ambulatory Visit | Attending: Pulmonary Disease | Admitting: Pulmonary Disease

## 2015-10-09 ENCOUNTER — Encounter: Payer: Self-pay | Admitting: Internal Medicine

## 2015-10-09 VITALS — BP 134/66 | HR 67 | Ht 63.0 in | Wt 153.8 lb

## 2015-10-09 DIAGNOSIS — R0600 Dyspnea, unspecified: Secondary | ICD-10-CM | POA: Diagnosis not present

## 2015-10-09 DIAGNOSIS — R079 Chest pain, unspecified: Secondary | ICD-10-CM | POA: Diagnosis not present

## 2015-10-09 DIAGNOSIS — R0609 Other forms of dyspnea: Secondary | ICD-10-CM | POA: Diagnosis not present

## 2015-10-09 DIAGNOSIS — R0789 Other chest pain: Secondary | ICD-10-CM

## 2015-10-09 DIAGNOSIS — I471 Supraventricular tachycardia: Secondary | ICD-10-CM

## 2015-10-09 NOTE — Progress Notes (Signed)
Electrophysiology Office Note   Date:  10/09/2015   ID:  Alexandra Reyes, DOB May 10, 1926, MRN [id number]  PCP:  Alexandra BarreJames [name], MD  Cardiologist:  Alexandra Reyes, (previously [name]) Primary Electrophysiologist: Alexandra RangeJames Penda Venturi, MD    Chief Complaint  Patient presents with  . Shortness of Breath     History of Present Illness: [name] is a 82 y.o. female who presents today for electrophysiology evaluation.  No further atach since I saw her last.  She continues to have occasional exertional SOB for which she is now following with pulmonary.  Chest wall pain is also stable.   Past Medical History  Diagnosis Date  . PAT (paroxysmal atrial tachycardia) (HCC)   . Macular degeneration   . OA (osteoarthritis)   . Hiatal hernia   . GERD (gastroesophageal reflux disease)   . Cholelithiasis   . COPD (chronic obstructive pulmonary disease) (HCC)   . HTN (hypertension) 05/18/2011  . Hyperlipidemia 05/18/2011  . Osteoporosis 05/18/2011  . Anemia, iron deficiency 05/18/2011  . DDD (degenerative disc disease), lumbar 05/18/2011  . Pectus excavatum 05/18/2011  . Allergy   . Allergic rhinitis, cause unspecified 05/22/2011  . Alcohol dependence in remission (HCC) 05/22/2011  . Personal history of colonic polyps 10/28/2012  . First degree AV block   . SVT (supraventricular tachycardia) (HCC)   . Myocardial infarction [facility](HCC)    Past Surgical History  Procedure Laterality Date  . Tonsillectomy and adenoidectomy    . Breast lumpectomy    . [place] echocardiography  01/17/2003    EF 55-60%  . Cardiovascular stress test  03/08/2009    EF 84%  . Abdominal hysterectomy  1988  . Breast biopsy  1948     Current Outpatient Prescriptions  Medication Sig Dispense Refill  . acetaminophen (TYLENOL) 325 MG tablet Take 650 mg by mouth every 6 (six) hours as needed (joint pain).    Marland Kitchen. ALPRAZolam (XANAX) 0.25 MG tablet Take 0.25 mg by mouth at bedtime as needed for anxiety.    Marland Kitchen. amiodarone (PACERONE) 200 MG  tablet Take 200 mg by mouth daily.    Marland Kitchen. amLODipine (NORVASC) 2.5 MG tablet TAKE 1 TABLET BY MOUTH EVERY DAY 90 tablet 3  . Ascorbic Acid (VITAMIN C PO) Take 1 tablet by mouth daily. Reported on 08/08/2015    . aspirin 81 MG tablet Take 81 mg by mouth daily.      . B Complex Vitamins (VITAMIN B COMPLEX PO) Take 1 tablet by mouth daily.     . calcium carbonate (OS-CAL) 600 MG tablet Take 600 mg by mouth daily.    Marland Kitchen. diltiazem (CARDIZEM CD) 120 MG 24 hr capsule Take 1 capsule by mouth daily.    Marland Kitchen. diltiazem (CARDIZEM) 30 MG tablet Take 1-2 tablets by mouth every 6 hours as needed for fast heart rates 30 tablet 1  . ferrous sulfate 325 (65 FE) MG tablet Take 325 mg by mouth daily.     . furosemide (LASIX) 40 MG tablet Take 1 tablet (40 mg total) by mouth daily. 90 tablet 3  . Glucosamine HCl-MSM (GLUCOSAMINE-MSM PO) Take 2 tablets by mouth daily.     . nitroGLYCERIN (NITROSTAT) 0.4 MG SL tablet Place 1 tablet (0.4 mg total) under the tongue every 5 (five) minutes as needed for chest pain. 25 tablet 3  . ranitidine (ZANTAC) 150 MG capsule Take 150 mg by mouth at bedtime.    . simvastatin (ZOCOR) 10 MG tablet Take 1 tablet (10 mg total)  by mouth daily at 6 PM. 90 tablet 3  . triamcinolone cream (KENALOG) 0.1 % Apply 1 application topically 2 (two) times daily. Apply to affected area    . vitamin E 200 UNIT capsule Take 200 Units by mouth daily.     No current facility-administered medications for this visit.    Allergies:   Lisinopril and Pneumovax 23   Social History:  The patient  reports that she quit smoking about 21 years ago. Her smoking use included Cigarettes. She started smoking about 56 years ago. She has a 54 pack-year smoking history. She has never used smokeless tobacco. She reports that she does not drink alcohol or use illicit drugs.   Family History:  The patient's family history includes Arthritis in her mother; Heart disease in her father; Heart failure in her mother; Hypertension in  her father; Kidney failure in her father.    ROS:  Please see the history of present illness.   All other systems are reviewed and negative.    PHYSICAL EXAM: VS:  BP 134/66 mmHg  Pulse 67  Ht  (1.6 m)  Wt 153 lb 12.8 oz (69.763 kg)  BMI 27.25 kg/m2 , BMI Body mass index is 27.25 kg/(m^2). GEN: elderly, in no acute distress HEENT: normal Neck: no JVD, carotid bruits, or masses Cardiac: RRR; no murmurs, rubs, or gallops,+1 edema , support hose in place Respiratory:  clear to auscultation bilaterally, normal work of breathing GI: soft, nontender, nondistended, + BS MS: no deformity or atrophy Skin: warm and dry  Neuro:  Strength and sensation are intact Psych: euthymic mood, full affect  EKG:  Ordered today reveals sinus rhythm 67 bpm, PR 266 msec, anterolateral infarct, inferior infarct   Recent Labs: 05/24/2015: TSH 3.969 08/02/2015: ALT 16; BUN 17; Creatinine, Ser 1.09; Hemoglobin 14.4; Platelets 390.0; Potassium 4.3; Sodium 133*    Lipid Panel     Component Value Date/Time   CHOL 167 05/24/2015 0831   TRIG 71 05/24/2015 0831   HDL 84 05/24/2015 0831   CHOLHDL 2.0 05/24/2015 0831   VLDL 14 05/24/2015 0831   LDLCALC 69 05/24/2015 0831   LDLDIRECT 129.2 05/15/2011 0925     Wt Readings from Last 3 Encounters:  10/09/15 153 lb 12.8 oz (69.763 kg)  10/04/15 155 lb (70.308 kg)  08/30/15 159 lb (72.122 kg)     ASSESSMENT AND PLAN:  1.  SVT Long RP,  Likely atach with first degree AV block.  Presently well controlled with amiodarone  2. HTN Stable No change required today  3. Venous insufficiency Support hose  4. SOB/ chest discomfort myoview and PFTs again reviewed with the patient today.  These were low risk Continues to follow with pulmonary and repeat CT has been ordered We talked about the possibility of a falsely negative myoview.  I have offered cath.  At this point, she would like to avoid this.  Given her advanced age, I would favor a  conservative approach also.  She wishes to discuss her symptoms further with Alexandra Anne Fu when she sees him in June.  She will contact my office if problems arise in the interim.  Return to see me in 4 months Follow-up with Alexandra Anne Fu as scheduled  Signed, Alexandra Range, MD  10/09/2015 11:47 AM     [facility] [address] [place] [address] [phone] (office) [phone] (fax)

## 2015-10-09 NOTE — Patient Instructions (Signed)
Medication Instructions:  Your physician recommends that you continue on your current medications as directed. Please refer to the Current Medication list given to you today.   Labwork: None ordered   Testing/Procedures: None ordered   Follow-Up: Your physician recommends that you schedule a follow-up appointment in: 4 months with Dr. Allred   Any Other Special Instructions Will Be Listed Below (If Applicable).     If you need a refill on your cardiac medications before your next appointment, please call your pharmacy.   

## 2015-10-10 ENCOUNTER — Telehealth: Payer: Self-pay | Admitting: Pulmonary Disease

## 2015-10-10 NOTE — Telephone Encounter (Signed)
Spoke with pt and gave CT results. Nothing further needed.  

## 2015-10-11 NOTE — Telephone Encounter (Signed)
Opened in error-prm  ° °

## 2015-10-18 DIAGNOSIS — F4329 Adjustment disorder with other symptoms: Secondary | ICD-10-CM | POA: Diagnosis not present

## 2015-11-15 ENCOUNTER — Ambulatory Visit (INDEPENDENT_AMBULATORY_CARE_PROVIDER_SITE_OTHER): Payer: Medicare Other | Admitting: Pulmonary Disease

## 2015-11-15 ENCOUNTER — Encounter: Payer: Self-pay | Admitting: Pulmonary Disease

## 2015-11-15 ENCOUNTER — Encounter: Payer: Self-pay | Admitting: Internal Medicine

## 2015-11-15 VITALS — BP 144/88 | HR 64 | Ht 64.0 in | Wt 151.0 lb

## 2015-11-15 DIAGNOSIS — R0609 Other forms of dyspnea: Secondary | ICD-10-CM | POA: Diagnosis not present

## 2015-11-15 DIAGNOSIS — L608 Other nail disorders: Secondary | ICD-10-CM | POA: Diagnosis not present

## 2015-11-15 DIAGNOSIS — R05 Cough: Secondary | ICD-10-CM | POA: Diagnosis not present

## 2015-11-15 DIAGNOSIS — R059 Cough, unspecified: Secondary | ICD-10-CM

## 2015-11-15 DIAGNOSIS — R1013 Epigastric pain: Secondary | ICD-10-CM | POA: Diagnosis not present

## 2015-11-15 MED ORDER — BUDESONIDE-FORMOTEROL FUMARATE 80-4.5 MCG/ACT IN AERO: 2.0000 | INHALATION_SPRAY | Freq: Two times a day (BID) | RESPIRATORY_TRACT | Status: DC

## 2015-11-15 NOTE — Progress Notes (Signed)
Subjective:    Patient ID: Alexandra Reyes, female    DOB: [birth year], 82 y.o.   MRN: [id number]  C.C.:  Follow-up for Dyspnea w/ Cough, GERD, & Chest Wall Pain.  HPI Dyspnea w/ Cough:  She reports her cough continues. She is still coughing up some small plugs.   GERD: On Zantac. She reports she has had more constipation. Denies any reflux but has had some dyspepsia occasionally.   Chest Wall Pain:  Previously treated with Ultram. No structural cause on CT of the chest. She reports her chest pain has resolved.  Review of Systems  No fever, chills, or sweats. No nausea or emesis.   Allergies  Allergen Reactions  . Lisinopril     Unknown Pt states she's never taken Lisinopril  . Pneumovax 23 [Pneumococcal Vac Polyvalent] Other (See Comments)    Flu like symtpoms for 1 day    Current Outpatient Prescriptions on File Prior to Visit  Medication Sig Dispense Refill  . acetaminophen (TYLENOL) 325 MG tablet Take 650 mg by mouth every 6 (six) hours as needed (joint pain).    Marland Kitchen ALPRAZolam (XANAX) 0.25 MG tablet Take 0.25 mg by mouth at bedtime as needed for anxiety.    Marland Kitchen amiodarone (PACERONE) 200 MG tablet Take 200 mg by mouth daily.    Marland Kitchen amLODipine (NORVASC) 2.5 MG tablet TAKE 1 TABLET BY MOUTH EVERY DAY 90 tablet 3  . Ascorbic Acid (VITAMIN C PO) Take 1 tablet by mouth daily. Reported on 08/08/2015    . aspirin 81 MG tablet Take 81 mg by mouth daily.      . B Complex Vitamins (VITAMIN B COMPLEX PO) Take 1 tablet by mouth daily.     . calcium carbonate (OS-CAL) 600 MG tablet Take 600 mg by mouth daily.    Marland Kitchen diltiazem (CARDIZEM CD) 120 MG 24 hr capsule Take 1 capsule by mouth daily.    Marland Kitchen diltiazem (CARDIZEM) 30 MG tablet Take 1-2 tablets by mouth every 6 hours as needed for fast heart rates 30 tablet 1  . ferrous sulfate 325 (65 FE) MG tablet Take 325 mg by mouth daily.     . furosemide (LASIX) 40 MG tablet Take 1 tablet (40 mg total) by mouth daily. 90 tablet 3  . Glucosamine HCl-MSM  (GLUCOSAMINE-MSM PO) Take 2 tablets by mouth daily.     . nitroGLYCERIN (NITROSTAT) 0.4 MG SL tablet Place 1 tablet (0.4 mg total) under the tongue every 5 (five) minutes as needed for chest pain. 25 tablet 3  . ranitidine (ZANTAC) 150 MG capsule Take 150 mg by mouth at bedtime.    . simvastatin (ZOCOR) 10 MG tablet Take 1 tablet (10 mg total) by mouth daily at 6 PM. 90 tablet 3  . triamcinolone cream (KENALOG) 0.1 % Apply 1 application topically 2 (two) times daily. Apply to affected area    . vitamin E 200 UNIT capsule Take 200 Units by mouth daily.     No current facility-administered medications on file prior to visit.    Past Medical History  Diagnosis Date  . PAT (paroxysmal atrial tachycardia) (HCC)   . Macular degeneration   . OA (osteoarthritis)   . Hiatal hernia   . GERD (gastroesophageal reflux disease)   . Cholelithiasis   . COPD (chronic obstructive pulmonary disease) (HCC)   . HTN (hypertension) 05/18/2011  . Hyperlipidemia 05/18/2011  . Osteoporosis 05/18/2011  . Anemia, iron deficiency 05/18/2011  . DDD (degenerative disc disease), lumbar 05/18/2011  .  Pectus excavatum 05/18/2011  . Allergy   . Allergic rhinitis, cause unspecified 05/22/2011  . Alcohol dependence in remission (HCC) 05/22/2011  . Personal history of colonic polyps 10/28/2012  . First degree AV block   . SVT (supraventricular tachycardia) (HCC)   . Myocardial infarction [facility])     Past Surgical History  Procedure Laterality Date  . Tonsillectomy and adenoidectomy    . Breast lumpectomy    . US echocardiography  01/17/2003    EF 55-60%  . Cardiovascular stress test  03/08/2009    EF 84%  . Abdominal hysterectomy  1988  . Breast biopsy  1948    Family History  Problem Relation Age of Onset  . Heart failure Mother   . Arthritis Mother   . Kidney failure Father   . Heart disease Father   . Hypertension Father     Social History   Social History  . Marital Status: Widowed    Spouse Name: N/A    . Number of Children: Y  . Years of Education: 11   Occupational History  . Retired Designer, jewellery    Social History Main Topics  . Smoking status: Former Smoker -- 1.50 packs/day for 36 years    Types: Cigarettes    Start date: 03/01/1959    Quit date: 06/10/1994  . Smokeless tobacco: Never Used     Comment: smoked 1- 1.5 ppd.   . Alcohol Use: No  . Drug Use: No  . Sexual Activity: Not Asked   Other Topics Concern  . None   Social History Narrative   Originally from [place], [place]. She moved to [place] in 1950 during the polio epidemic. 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 Remote bird exposure. No mold or hot tub exposure.       Objective:   Physical Exam BP 144/88 mmHg  Pulse 64  Ht 5\' 4"  (1.626 m)  Wt 151 lb (68.493 kg)  BMI 25.91 kg/m2  SpO2 97% General:  Awake. Alert. Elderly female. No distress. Integument:  Warm & dry. No rash on exposed skin.  Lymphatics:  No appreciated cervical or supraclavicular lymphadenoapthy. HEENT:  Moist mucus membranes. No oral ulcers. Mild nasal turbinate swelling. Cardiovascular:  Regular rate. No edema.Normal S1 & S2.  Pulmonary:  Clear bilaterally on auscultation. Normal effort of breathing on room air. Speaking in complete sentences.  PFT 07/12/15: FVC 2.29 L (106%) FEV1 1.57 L (99%) FEV1/FVC 0.68 FEF 25-75 0.78 L (81%) no bronchodilator response TLC 3.59 L (73%) RV 52% ERV 342% DLCO uncorrected 52% 05/14/13: FVC 2.34 L (103%) FEV1 1.63 L (97%) FEV1/FVC 0.69 FEF 25-75 0.83 L (76%) no bronchodilator response TLC 4.51 L (92%) ERV 284% DLCO uncorrected 55%  IMAGING CT CHEST W/O 10/09/15 (previously reviewed by me):No pneumothorax or effusion. Minimal posterior subsegmental atelectasis. No appreciable bronchiectasis, mass, or nodule. No pathologic mediastinal adenopathy. No pericardial effusion. Mild coronary artery calcification.  BARIUM SWALLOW (08/11/15): No evidence for stricture or mass lesion. Moderate esophageal  dysmotility without hiatal hernia. Small amount spontaneous reflux into the lower third of the esophagus.  CTA CHEST 08/03/15 (previously reviewed by me): Mild lower lobe predominant atelectasis. No other nodule or opacity appreciated. No ulnar emboli. No pleural effusion or thickening. No pericardial effusion. No pathologic mediastinal adenopathy.  CARDIAC TTE (07/07/15): Narrow LVOT with normal cavity size. Mild LVH. EF 55-60%. Grade 1 diastolic dysfunction. LA & RA normal in size. RV normal in size and function. No aortic regurgitation. No mitral stenosis  or regurgitation. No pulmonic regurgitation. Mild tricuspid regurgitation. No pericardial effusion.  LABS 08/02/15 CBC: 9.2/14.4/42.7/390 BMP: 133/4.3/98/28/17/1.09/94/9.5 LFT: 4.2/7.0/0.5/62/18/16    Assessment & Plan:  82 year old female with ongoing cough and dyspnea. It is possible that her mild airway obstruction is contributing to her symptoms. Therefore, I feel it's reasonable to try her on an an alternative inhaled medication. I do question whether or not her dyspepsia could be cardiac or potentially GI in etiology. With minimal symptomatic relief on Zantac I feel it's reasonable to obtain a consultation from GI. Patient does have appointment next week with Dr. Anne FuSkains for possible left heart catheterization. I instructed the patient contact my office if she had any new breathing problems or questions before next appointment.  1. Dyspnea w/ Cough:  Trial of Symbicort 80/4.5. Patient counseled on appropriate oral hygiene. She will contact our office for prescription if this is effective.  2. GERD/dyspepsia: Trying patient on Zantac given constipation. Referring to GI for further evaluation and possible upper endoscopy. 3. Follow-up: Patient to return to clinic in  2-3 monthsor sooner if needed.  Donna ChristenJennings E. [name], M.D. [facility] Pager:  [phone] After 3pm or if no response, call [phone] 12:27 PM  11/15/2015

## 2015-11-15 NOTE — Addendum Note (Signed)
Addended by: Gweneth DimitriJONES, Woodie Degraffenreid D on: 11/15/2015 12:58 PM   Modules accepted: Orders

## 2015-11-15 NOTE — Patient Instructions (Signed)
   Try stopping your Zantac to see if this helps with your stomach discomfort.  I am referring you to a GI specialist for possible upper endoscopy  Use the Symbicort inhaler daily. Take 2 puffs twice daily. Remember to rinse, gargle, spit, brush your tongue, & brush your teeth after to keep from getting thrush.  Call me if you cough & breathing get better on the Symbicort as I will then give you a prescription.

## 2015-11-15 NOTE — Addendum Note (Signed)
Addended by: Gweneth DimitriJONES, Sangeeta Youse D on: 11/15/2015 01:01 PM   Modules accepted: Orders

## 2015-11-15 NOTE — Progress Notes (Signed)
Patient education given on Symbicort and the patient expresses understanding and acceptance of instructions. [name] 11/15/2015 1:07 PM

## 2015-11-20 ENCOUNTER — Telehealth: Payer: Self-pay | Admitting: Pulmonary Disease

## 2015-11-20 NOTE — Telephone Encounter (Signed)
Spoke with the pt  She was given Symbicort 80 at last ov 11/15/15:   Try stopping your Zantac to see if this helps with your stomach discomfort.  I am referring you to a GI specialist for possible upper endoscopy  Use the Symbicort inhaler daily. Take 2 puffs twice daily. Remember to rinse, gargle, spit, brush your tongue, & brush your teeth after to keep from getting thrush.  Call me if you cough & breathing get better on the Symbicort as I will then give you a prescription  She reports that she had increased cough, SOB and felt jittery while taking symbicort so she has not taken any over the past 2 days  She is feeling much better now since she stopped, but wonders if there is another alternative  JN, please advise thanks!

## 2015-11-20 NOTE — Telephone Encounter (Signed)
Have her try one inhalation twice daily to see if this produces the same reaction. If it does then have her stop and call to let us know. Thanks.

## 2015-11-21 NOTE — Telephone Encounter (Signed)
Spoke with the pt and notified of recs per JN  She verbalized understanding and denied any questions  Will call as needed

## 2015-11-22 ENCOUNTER — Encounter: Payer: Self-pay | Admitting: Cardiology

## 2015-11-22 ENCOUNTER — Ambulatory Visit (INDEPENDENT_AMBULATORY_CARE_PROVIDER_SITE_OTHER): Payer: Medicare Other | Admitting: Cardiology

## 2015-11-22 VITALS — BP 132/66 | HR 61 | Ht 64.0 in | Wt 151.8 lb

## 2015-11-22 DIAGNOSIS — I471 Supraventricular tachycardia: Secondary | ICD-10-CM | POA: Diagnosis not present

## 2015-11-22 DIAGNOSIS — R0789 Other chest pain: Secondary | ICD-10-CM

## 2015-11-22 DIAGNOSIS — R0609 Other forms of dyspnea: Secondary | ICD-10-CM | POA: Diagnosis not present

## 2015-11-22 DIAGNOSIS — Z79899 Other long term (current) drug therapy: Secondary | ICD-10-CM | POA: Diagnosis not present

## 2015-11-22 NOTE — Progress Notes (Signed)
Cardiology Office Note    Date:  11/22/2015   ID:  Alexandra Reyes, DOB [birth year], MRN [id number]  PCP:  Alexandra BarreJames [name], MD  Cardiologist:   Alexandra SchultzMark Kari Montero, MD , EP: Dr. Johney Reyes    History of Present Illness:  [name] is a 82 y.o. female former patient of Alexandra Reyes's here for follow-up of paroxysmal supraventricular tachycardia. She is retired Engineer, civil (consulting)nurse. She has been a widow for over 20 years.Husband Lollie SailsHarry died in [id number].  Nuclear stress test in 2010 and 06/02/14 was normal. No prior cardiac catheterization.  Echocardiogram from 06/16/13 showed diastolic dysfunction, EF 65%.  Dr. Johney Reyes has been seeing her for paroxysmal supraventricular tachycardia which has been long-standing. He diagnosed her with atrial tachycardia. Placed her on amiodarone. He felt we would do better overall avoiding ablation given advanced age. Monitoring thyroid and liver.  Chest wall pain has been stable. Exertional dyspnea has been followed by pulmonary.She also has GERD-like symptoms after eating. She has had this for years. Her shortness of breath has been fairly stable. Trying Symbicort. Decreased dose because of shakiness with increased dose.  [place], she worked at [employer], school of nursing.  Past Medical History  Diagnosis Date  . PAT (paroxysmal atrial tachycardia) (HCC)   . Macular degeneration   . OA (osteoarthritis)   . Hiatal hernia   . GERD (gastroesophageal reflux disease)   . Cholelithiasis   . COPD (chronic obstructive pulmonary disease) (HCC)   . HTN (hypertension) 05/18/2011  . Hyperlipidemia 05/18/2011  . Osteoporosis 05/18/2011  . Anemia, iron deficiency 05/18/2011  . DDD (degenerative disc disease), lumbar 05/18/2011  . Pectus excavatum 05/18/2011  . Allergy   . Allergic rhinitis, cause unspecified 05/22/2011  . Alcohol dependence in remission (HCC) 05/22/2011  . Personal history of colonic polyps 10/28/2012  . First degree AV block   . SVT  (supraventricular tachycardia) (HCC)   . Myocardial infarction [facility](HCC)     Past Surgical History  Procedure Laterality Date  . Tonsillectomy and adenoidectomy    . Breast lumpectomy    . [place] echocardiography  01/17/2003    EF 55-60%  . Cardiovascular stress test  03/08/2009    EF 84%  . Abdominal hysterectomy  1988  . Breast biopsy  1948    Current Medications: Outpatient Prescriptions Prior to Visit  Medication Sig Dispense Refill  . acetaminophen (TYLENOL) 325 MG tablet Take 650 mg by mouth every 6 (six) hours as needed (joint pain).    Marland Kitchen. ALPRAZolam (XANAX) 0.25 MG tablet Take 0.25 mg by mouth at bedtime as needed for anxiety.    Marland Kitchen. amiodarone (PACERONE) 200 MG tablet Take 200 mg by mouth daily.    Marland Kitchen. amLODipine (NORVASC) 2.5 MG tablet TAKE 1 TABLET BY MOUTH EVERY DAY 90 tablet 3  . Ascorbic Acid (VITAMIN C PO) Take 1 tablet by mouth daily. Reported on 08/08/2015    . aspirin 81 MG tablet Take 81 mg by mouth daily.      . B Complex Vitamins (VITAMIN B COMPLEX PO) Take 1 tablet by mouth daily.     . calcium carbonate (OS-CAL) 600 MG tablet Take 600 mg by mouth daily.    Marland Kitchen. diltiazem (CARDIZEM CD) 120 MG 24 hr capsule Take 1 capsule by mouth daily.    Marland Kitchen. diltiazem (CARDIZEM) 30 MG tablet Take 1-2 tablets by mouth every 6 hours as needed for fast heart rates 30 tablet 1  . ferrous sulfate 325 (65 FE)  MG tablet Take 325 mg by mouth daily.     . furosemide (LASIX) 40 MG tablet Take 1 tablet (40 mg total) by mouth daily. 90 tablet 3  . Glucosamine HCl-MSM (GLUCOSAMINE-MSM PO) Take 2 tablets by mouth daily.     . nitroGLYCERIN (NITROSTAT) 0.4 MG SL tablet Place 1 tablet (0.4 mg total) under the tongue every 5 (five) minutes as needed for chest pain. 25 tablet 3  . ranitidine (ZANTAC) 150 MG capsule Take 150 mg by mouth at bedtime.    . simvastatin (ZOCOR) 10 MG tablet Take 1 tablet (10 mg total) by mouth daily at 6 PM. 90 tablet 3  . triamcinolone cream (KENALOG) 0.1 % Apply 1 application  topically 2 (two) times daily. Apply to affected area    . vitamin E 200 UNIT capsule Take 200 Units by mouth daily.    . budesonide-formoterol (SYMBICORT) 80-4.5 MCG/ACT inhaler Inhale 2 puffs into the lungs 2 (two) times daily. (Patient not taking: Reported on 11/22/2015) 1 Inhaler 0   No facility-administered medications prior to visit.     Allergies:   Lisinopril; Pneumovax 23; and Symbicort   Social History   Social History  . Marital Status: Widowed    Spouse Name: N/A  . Number of Children: Y  . Years of Education: 11   Occupational History  . Retired Designer, jewellery    Social History Main Topics  . Smoking status: Former Smoker -- 1.50 packs/day for 36 years    Types: Cigarettes    Start date: 03/01/1959    Quit date: 06/10/1994  . Smokeless tobacco: Never Used     Comment: smoked 1- 1.5 ppd.   . Alcohol Use: No  . Drug Use: No  . Sexual Activity: Not Asked   Other Topics Concern  . None   Social History Narrative   Originally from [place], [place]. She moved to [place] in 1950 during the polio epidemic. 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 Remote bird exposure. No mold or hot tub exposure.      Family History:  The patient's family history includes Arthritis in her mother; Heart disease in her father; Heart failure in her mother; Hypertension in her father; Kidney failure in her father.   ROS:   Please see the history of present illness.    ROS All other systems reviewed and are negative.   PHYSICAL EXAM:   VS:  BP 132/66 mmHg  Pulse 61  Ht 5\' 4"  (1.626 m)  Wt 151 lb 12.8 oz (68.856 kg)  BMI 26.04 kg/m2   GEN: Well nourished, well developed, in no acute distress HEENT: normal Neck: no JVD, carotid bruits, or masses Cardiac: RRR; no murmurs, rubs, or gallops,no edema  Respiratory:  clear to auscultation bilaterally, normal work of breathing GI: soft, nontender, nondistended, + BS MS: no deformity or atrophy Skin: warm and dry, no rash Neuro:   Alert and Oriented x 3, Strength and sensation are intact Psych: euthymic mood, full affect  Wt Readings from Last 3 Encounters:  11/22/15 151 lb 12.8 oz (68.856 kg)  11/15/15 151 lb (68.493 kg)  10/09/15 153 lb 12.8 oz (69.763 kg)      Studies/Labs Reviewed:   EKG: none today Recent Labs: 05/24/2015: TSH 3.969 08/02/2015: ALT 16; BUN 17; Creatinine, Ser 1.09; Hemoglobin 14.4; Platelets 390.0; Potassium 4.3; Sodium 133*   Lipid Panel    Component Value Date/Time   CHOL 167 05/24/2015 0831   TRIG 71 05/24/2015 0831  HDL 84 05/24/2015 0831   CHOLHDL 2.0 05/24/2015 0831   VLDL 14 05/24/2015 0831   LDLCALC 69 05/24/2015 0831   LDLDIRECT 129.2 05/15/2011 0925    Additional studies/ records that were reviewed today include:  Prior office visits, echocardiogram, lab work reviewed  NUC stress: 07/12/15:  Nuclear stress EF: 80%.  Defect 1: There is a small defect of moderate severity present in the basal inferoseptal location.  Findings consistent with prior myocardial infarction.  This is a low risk study.   ASSESSMENT:    1. Paroxysmal SVT (supraventricular tachycardia) (HCC)   2. Long-term use of high-risk medication   3. Other chest pain   4. DOE (dyspnea on exertion)      PLAN:  In order of problems listed above:  Paroxysmal supraventricular tachycardia  - Paroxysmal atrial tachycardia responding well to amiodarone, Dr. Johney Frame  - Long RP tachycardia. He states that he cannot exclude a reentrant arrhythmia.  - First degree AV block noted. No syncope.  Diastolic dysfunction  - Stable, normal ejection fraction  Mild coronary artery calcification  - Seen on CT scan 10/09/15. Continue with prevention efforts.  Dyspnea/chest discomfort  - Recent nuclear stress test reassuring, EF reassuring  - Pulmonary following  - Cardiac catheterization was contemplated previously however she was reluctant to pursue. I agree with conservative management at this point. She  definitely has chest discomfort but many of her instances occur after eating lunch with her friends and sound GERD-like. Encouraged upper endoscopy as Dr. Jamison Neighbor suggested. She does understand however that coronary disease can take place especially with age being the biggest risk factor. Recent reassuring nuclear stress test.  Chronic venous insufficiency/edema  - Support hose, agree  Essential hypertension  - Medications reviewed, stable    Medication Adjustments/Labs and Tests Ordered: Current medicines are reviewed at length with the patient today.  Concerns regarding medicines are outlined above.  Medication changes, Labs and Tests ordered today are listed in the Patient Instructions below. Patient Instructions  Medication Instructions:  Your physician recommends that you continue on your current medications as directed. Please refer to the Current Medication list given to you today.   Labwork: None ordered  Testing/Procedures: None ordered  Follow-Up: Your physician wants you to follow-up in: 4 months with AlexandraDianara Smullen You will receive a reminder letter in the mail two months in advance. If you don't receive a letter, please call our office to schedule the follow-up appointment.   Any Other Special Instructions Will Be Listed Below (If Applicable).     If you need a refill on your cardiac medications before your next appointment, please call your pharmacy.       Signed, Alexandra Schultz, MD  11/22/2015 10:19 AM    [facility] [address] [place], [place], [place]  [address] Phone: [phone]; Fax: [phone]

## 2015-11-22 NOTE — Patient Instructions (Signed)
Medication Instructions:  Your physician recommends that you continue on your current medications as directed. Please refer to the Current Medication list given to you today.   Labwork: None ordered   Testing/Procedures: None ordered   Follow-Up: Your physician wants you to follow-up in: 4 months with Dr. Skains You will receive a reminder letter in the mail two months in advance. If you don't receive a letter, please call our office to schedule the follow-up appointment.   Any Other Special Instructions Will Be Listed Below (If Applicable).     If you need a refill on your cardiac medications before your next appointment, please call your pharmacy.   

## 2015-11-23 DIAGNOSIS — L603 Nail dystrophy: Secondary | ICD-10-CM | POA: Diagnosis not present

## 2015-11-29 ENCOUNTER — Telehealth: Payer: Self-pay | Admitting: Pulmonary Disease

## 2015-11-29 DIAGNOSIS — F4329 Adjustment disorder with other symptoms: Secondary | ICD-10-CM | POA: Diagnosis not present

## 2015-11-29 MED ORDER — BUDESONIDE-FORMOTEROL FUMARATE 80-4.5 MCG/ACT IN AERO: 1.0000 | INHALATION_SPRAY | Freq: Two times a day (BID) | RESPIRATORY_TRACT | Status: DC

## 2015-11-29 NOTE — Telephone Encounter (Signed)
Just continue the one inhalation twice daily for now. Thanks.

## 2015-11-29 NOTE — Telephone Encounter (Signed)
Pt aware of rec's per JN. Rx sent to [facility] pharmacy - Symbicort 80mcg, Inhale 1 puff BID Nothing further needed.

## 2015-11-29 NOTE — Telephone Encounter (Signed)
Requesting a rx for Symbicort - given sample at last OV Patient Instructions 11/15/15     Try stopping your Zantac to see if this helps with your stomach discomfort.  I am referring you to a GI specialist for possible upper endoscopy  Use the Symbicort inhaler daily. Take 2 puffs twice daily. Remember to rinse, gargle, spit, brush your tongue, & brush your teeth after to keep from getting thrush.  Call me if you cough & breathing get better on the Symbicort as I will then give you a prescription.   Roslynn AmbleJennings E Nestor, MD at 11/20/2015 7:25 PM     Status: Signed       Expand All Collapse All   Have her try one inhalation twice daily to see if this produces the same reaction. If it does then have her stop and call to let us know. Thanks.       Pt states that she is tolerating the 1 puff BID dose well and is wanting to know if Dr [name] would like her to try and get back on the 2 puff BID dose. Pt had adverse reaction to the 2 puff BID dose and was instructed to back down. Please advise Dr [name]. Thanks.

## 2015-12-01 DIAGNOSIS — H43392 Other vitreous opacities, left eye: Secondary | ICD-10-CM | POA: Diagnosis not present

## 2015-12-01 DIAGNOSIS — H353211 Exudative age-related macular degeneration, right eye, with active choroidal neovascularization: Secondary | ICD-10-CM | POA: Diagnosis not present

## 2015-12-01 DIAGNOSIS — H353222 Exudative age-related macular degeneration, left eye, with inactive choroidal neovascularization: Secondary | ICD-10-CM | POA: Diagnosis not present

## 2015-12-01 DIAGNOSIS — H43813 Vitreous degeneration, bilateral: Secondary | ICD-10-CM | POA: Diagnosis not present

## 2015-12-06 DIAGNOSIS — M8589 Other specified disorders of bone density and structure, multiple sites: Secondary | ICD-10-CM | POA: Diagnosis not present

## 2015-12-06 DIAGNOSIS — Z1231 Encounter for screening mammogram for malignant neoplasm of breast: Secondary | ICD-10-CM | POA: Diagnosis not present

## 2015-12-20 ENCOUNTER — Encounter: Payer: Self-pay | Admitting: Internal Medicine

## 2015-12-20 DIAGNOSIS — H353211 Exudative age-related macular degeneration, right eye, with active choroidal neovascularization: Secondary | ICD-10-CM | POA: Diagnosis not present

## 2015-12-27 DIAGNOSIS — F4329 Adjustment disorder with other symptoms: Secondary | ICD-10-CM | POA: Diagnosis not present

## 2016-01-16 ENCOUNTER — Telehealth: Payer: Self-pay

## 2016-01-16 MED ORDER — ALPRAZOLAM 0.25 MG PO TABS: 0.2500 mg | ORAL_TABLET | Freq: Every evening | ORAL | 5 refills | Status: DC | PRN

## 2016-01-16 NOTE — Telephone Encounter (Signed)
Done hardcopy to Corinne  

## 2016-01-16 NOTE — Telephone Encounter (Signed)
Medication refill sent to pharmacy  

## 2016-01-16 NOTE — Telephone Encounter (Signed)
Please advise patient is requesting refill on alprazolam  

## 2016-01-17 ENCOUNTER — Other Ambulatory Visit: Payer: Self-pay

## 2016-01-17 ENCOUNTER — Ambulatory Visit (INDEPENDENT_AMBULATORY_CARE_PROVIDER_SITE_OTHER): Payer: Medicare Other | Admitting: Pulmonary Disease

## 2016-01-17 ENCOUNTER — Encounter: Payer: Self-pay | Admitting: Pulmonary Disease

## 2016-01-17 VITALS — BP 122/60 | HR 52 | Ht 64.0 in | Wt 151.0 lb

## 2016-01-17 DIAGNOSIS — R059 Cough, unspecified: Secondary | ICD-10-CM

## 2016-01-17 DIAGNOSIS — K219 Gastro-esophageal reflux disease without esophagitis: Secondary | ICD-10-CM | POA: Diagnosis not present

## 2016-01-17 DIAGNOSIS — R05 Cough: Secondary | ICD-10-CM | POA: Diagnosis not present

## 2016-01-17 NOTE — Progress Notes (Signed)
Subjective:    Patient ID: Alexandra Reyes, female    DOB: 07-27-1925, 82 y.o.   MRN: [id number]  C.C.:  Follow-up for Dyspnea w/ Cough & GERD.  HPI Dyspnea w/ Cough:  Patient given trial of Symbicort 80/4.5 at last appointment.Patient called in to say that she had increased cough, dyspnea, and last feeling jittery while taking Symbicort. She was restarted on the medication at 1 inhalation twice daily. She reports improvement in her cough as well as her mucus production. She did feel like her cough was worse when she stopped her Zantac totally. She reports her dypsnea has improved such that she can change the linens on her bed.   GERD: Restarted Zantac 75mg  two weeks ago. Referred to GI for possible EGD at last appointment. Has an appointment on 8/16. She has been taking Colace to help with her constipation.   Review of Systems  She reports she has had some increasing energy. She report she still has some chest discomfort. Denie any frank chest pain. No fever, chills, or sweats.   Allergies  Allergen Reactions  . Lisinopril     Unknown Pt states she's never taken Lisinopril  . Pneumovax 23 [Pneumococcal Vac Polyvalent] Other (See Comments)    Flu like symtpoms for 1 day  . Symbicort [Budesonide-Formoterol Fumarate]     Jittery, SOB, cough    Current Outpatient Prescriptions on File Prior to Visit  Medication Sig Dispense Refill  . acetaminophen (TYLENOL) 325 MG tablet Take 650 mg by mouth every 6 (six) hours as needed (joint pain).    Marland Kitchen ALPRAZolam (XANAX) 0.25 MG tablet Take 1 tablet (0.25 mg total) by mouth at bedtime as needed for anxiety. 30 tablet 5  . amiodarone (PACERONE) 200 MG tablet Take 200 mg by mouth daily.    Marland Kitchen amLODipine (NORVASC) 2.5 MG tablet TAKE 1 TABLET BY MOUTH EVERY DAY 90 tablet 3  . Ascorbic Acid (VITAMIN C PO) Take 1 tablet by mouth daily. Reported on 08/08/2015    . aspirin 81 MG tablet Take 81 mg by mouth daily.      . B Complex Vitamins (VITAMIN B COMPLEX  PO) Take 1 tablet by mouth daily.     . budesonide-formoterol (SYMBICORT) 80-4.5 MCG/ACT inhaler Inhale 1 puff into the lungs 2 (two) times daily. 1 Inhaler 3  . calcium carbonate (OS-CAL) 600 MG tablet Take 600 mg by mouth daily.    Marland Kitchen diltiazem (CARDIZEM CD) 120 MG 24 hr capsule Take 1 capsule by mouth daily.    Marland Kitchen diltiazem (CARDIZEM) 30 MG tablet Take 1-2 tablets by mouth every 6 hours as needed for fast heart rates 30 tablet 1  . ferrous sulfate 325 (65 FE) MG tablet Take 325 mg by mouth daily.     . furosemide (LASIX) 40 MG tablet Take 1 tablet (40 mg total) by mouth daily. 90 tablet 3  . Glucosamine HCl-MSM (GLUCOSAMINE-MSM PO) Take 2 tablets by mouth daily.     . nitroGLYCERIN (NITROSTAT) 0.4 MG SL tablet Place 1 tablet (0.4 mg total) under the tongue every 5 (five) minutes as needed for chest pain. 25 tablet 3  . simvastatin (ZOCOR) 10 MG tablet Take 1 tablet (10 mg total) by mouth daily at 6 PM. 90 tablet 3  . triamcinolone cream (KENALOG) 0.1 % Apply 1 application topically 2 (two) times daily. Apply to affected area    . vitamin E 200 UNIT capsule Take 200 Units by mouth daily.     No  current facility-administered medications on file prior to visit.     Past Medical History:  Diagnosis Date  . Alcohol dependence in remission (HCC) 05/22/2011  . Allergic rhinitis, cause unspecified 05/22/2011  . Allergy   . Anemia, iron deficiency 05/18/2011  . Cholelithiasis   . COPD (chronic obstructive pulmonary disease) (HCC)   . DDD (degenerative disc disease), lumbar 05/18/2011  . First degree AV block   . GERD (gastroesophageal reflux disease)   . Hiatal hernia   . HTN (hypertension) 05/18/2011  . Hyperlipidemia 05/18/2011  . Macular degeneration   . Myocardial infarction (HCC)   . OA (osteoarthritis)   . Osteoporosis 05/18/2011  . PAT (paroxysmal atrial tachycardia) (HCC)   . Pectus excavatum 05/18/2011  . Personal history of colonic polyps 10/28/2012  . SVT (supraventricular  tachycardia) (HCC)     Past Surgical History:  Procedure Laterality Date  . ABDOMINAL HYSTERECTOMY  1988  . BREAST BIOPSY  1948  . BREAST LUMPECTOMY    . CARDIOVASCULAR STRESS TEST  03/08/2009   EF 84%  . TONSILLECTOMY AND ADENOIDECTOMY    . US ECHOCARDIOGRAPHY  01/17/2003   EF 55-60%    Family History  Problem Relation Age of Onset  . Heart failure Mother   . Arthritis Mother   . Kidney failure Father   . Heart disease Father   . Hypertension Father     Social History   Social History  . Marital status: Widowed    Spouse name: N/A  . Number of children: Y  . Years of education: 2111   Occupational History  . Retired Designer, jewelleryegistered Nurse    Social History Main Topics  . Smoking status: Former Smoker    Packs/day: 1.50    Years: 36.00    Types: Cigarettes    Start date: 03/01/1959    Quit date: 06/10/1994  . Smokeless tobacco: Never Used     Comment: smoked 1- 1.5 ppd.   . Alcohol use No  . Drug use: No  . Sexual activity: Not Asked   Other Topics Concern  . None   Social History Narrative   [place] She moved to [place] in 1950 during the polio epidemic. Previously worked as a Engineer, civil (consulting)nurse. No pets currently. Remote bird exposure. No mold or hot tub exposure.       Objective:   Physical Exam BP 122/60 (BP Location: Left Arm, Cuff Size: Normal)   Pulse (!) 52   Ht 5\' 4"  (1.626 m)   Wt 151 lb (68.5 kg)   SpO2 98%   BMI 25.92 kg/m  General:  Awake. Alert. No distress. Integument:  Warm & dry. No rash on exposed skin.  Lymphatics:  No appreciated cervical or supraclavicular lymphadenoapthy. HEENT:  Moist mucus membranes. No oral ulcers. Minimal nasal turbinate swelling. Cardiovascular:  Regular rate & rhythm. No edema.Normal S1 & S2.  Pulmonary:  Good aeration bilaterally. Normal work of breathing on room air. Clear on auscultation.  PFT 07/12/15: FVC 2.29 L (106%) FEV1 1.57 L (99%) FEV1/FVC 0.68 FEF 25-75 0.78 L (81%) no bronchodilator response TLC  3.59 L (73%) RV 52% ERV 342% DLCO uncorrected 52% 05/14/13: FVC 2.34 L (103%) FEV1 1.63 L (97%) FEV1/FVC 0.69 FEF 25-75 0.83 L (76%) no bronchodilator response TLC 4.51 L (92%) ERV 284% DLCO uncorrected 55%  IMAGING CT CHEST W/O 10/09/15 (previously reviewed by me):No pneumothorax or effusion. Minimal posterior subsegmental atelectasis. No appreciable bronchiectasis, mass, or nodule. No pathologic mediastinal adenopathy. No pericardial effusion.  Mild coronary artery calcification.  BARIUM SWALLOW (08/11/15): No evidence for stricture or mass lesion. Moderate esophageal dysmotility without hiatal hernia. Small amount spontaneous reflux into the lower third of the esophagus.  CTA CHEST 08/03/15 (previously reviewed by me): Mild lower lobe predominant atelectasis. No other nodule or opacity appreciated. No ulnar emboli. No pleural effusion or thickening. No pericardial effusion. No pathologic mediastinal adenopathy.  CARDIAC TTE (07/07/15): Narrow LVOT with normal cavity size. Mild LVH. EF 55-60%. Grade 1 diastolic dysfunction. LA & RA normal in size. RV normal in size and function. No aortic regurgitation. No mitral stenosis or regurgitation. No pulmonic regurgitation. Mild tricuspid regurgitation. No pericardial effusion.  LABS 08/02/15 CBC: 9.2/14.4/42.7/390 BMP: 133/4.3/98/28/17/1.09/94/9.5 LFT: 4.2/7.0/0.5/62/18/16    Assessment & Plan:  82 y.o. female with ongoing cough and dyspnea. With patient's symptomatic improvement on Symbicort as well as Zantac I remain highly suspicious about reflux as a cause for the multitude of her symptoms. I instructed the patient to continue on her medications as currently prescribed and we may be able to discontinue her inhaler medication at follow-up appointment depending upon symptom control. I instructed the patient contact my office if she had any new breathing problems or questions before next appointment.  1. Dyspnea w/ Cough:  Continuing Symbicort as  prescribed. May be able to discontinue at next appointment. 2. GERD/Dyspepsia: Continuing Zantac  qhs. Has appointment with GI upcoming. 3. Follow-up: Patient to return to clinic in  2 months or sooner if needed.  Donna Christen Jamison Neighbor, M.D. [facility] Pager:  [phone] After 3pm or if no response, call [phone] 11:05 AM 01/17/16

## 2016-01-17 NOTE — Patient Instructions (Signed)
   Continue using your Symbicort 1 puff twice daily.  Continue using your Zantac  daily for now.  Call me if you have a worse cough or questions before your next appointment in 2 months.

## 2016-01-24 ENCOUNTER — Ambulatory Visit (INDEPENDENT_AMBULATORY_CARE_PROVIDER_SITE_OTHER): Payer: Medicare Other | Admitting: Internal Medicine

## 2016-01-24 ENCOUNTER — Encounter: Payer: Self-pay | Admitting: Internal Medicine

## 2016-01-24 VITALS — BP 114/54 | HR 60 | Ht 61.42 in | Wt 150.1 lb

## 2016-01-24 DIAGNOSIS — K219 Gastro-esophageal reflux disease without esophagitis: Secondary | ICD-10-CM | POA: Diagnosis not present

## 2016-01-24 DIAGNOSIS — R053 Chronic cough: Secondary | ICD-10-CM

## 2016-01-24 DIAGNOSIS — R079 Chest pain, unspecified: Secondary | ICD-10-CM | POA: Diagnosis not present

## 2016-01-24 DIAGNOSIS — R05 Cough: Secondary | ICD-10-CM | POA: Diagnosis not present

## 2016-01-24 MED ORDER — OMEPRAZOLE 40 MG PO CPDR: 40.0000 mg | DELAYED_RELEASE_CAPSULE | Freq: Every day | ORAL | 6 refills | Status: DC

## 2016-01-24 NOTE — Progress Notes (Signed)
HISTORY OF PRESENT ILLNESS:  Alexandra Reyes is a 82 y.o. female, retired Engineer, civil (consulting)nurse, with past medical history as listed below. She is referred today by her pulmonologist Alexandra Reyes regarding chronic dyspnea, cough, chest pain. I saw the patient in 2006 for colonoscopy to evaluate hematochezia. She was found to have diminutive adenomas. Also diverticulosis and hemorrhoids as the source of bleeding. Has not been seen since. Patient tells me that her history dates back to last year when she developed problems with atrial tachycardia which she was seen by letter physiology and placed on amiodarone. With episodes of tachycardia she was experiencing chest pain. She describes to me several types of chest pain. First, with increased activity she will develop shortness of breath followed by chest pain which is improved with resting. Next, she notices discomfort bilaterally under the rib cage which is a soreness. Finally, she does rarely have lower chest discomfort with large meals. No dysphagia. Her other complaint is that of cough which began earlier this year. She brings up phlegm chronically. Recent pulmonary evaluation. Placed on an inhaler which she states has helped. Also placed on Zantac wondering about GERD. This caused constipation. She took stool softeners which helped. Patient does have mild GERD symptoms by history. Previously treated with PPI by Alexandra Reyes. Subsequently managed with occasional antacids. No problems with dysphagia. No active reflux symptoms currently. Perhaps swallow 08/11/2015 was essentially normal. She is also had CT of the chest as well as CT angiography. TEE earlier this year. Cardiac catheterization had been contemplated.  REVIEW OF SYSTEMS:  All non-GI ROS negative except for sinus and allergy, anxiety, arthritis, back pain, cough, shortness of breath, ankle swelling  Past Medical History:  Diagnosis Date  . Alcohol dependence in remission (HCC) 05/22/2011  . Allergic rhinitis,  cause unspecified 05/22/2011  . Allergy   . Anemia, iron deficiency 05/18/2011  . Cholelithiasis   . COPD (chronic obstructive pulmonary disease) (HCC)   . DDD (degenerative disc disease), lumbar 05/18/2011  . First degree AV block   . GERD (gastroesophageal reflux disease)   . Hiatal hernia   . HTN (hypertension) 05/18/2011  . Hyperlipidemia 05/18/2011  . Macular degeneration   . Myocardial infarction (HCC)   . OA (osteoarthritis)   . Osteoporosis 05/18/2011  . PAT (paroxysmal atrial tachycardia) (HCC)   . Pectus excavatum 05/18/2011  . Personal history of colonic polyps 10/28/2012  . SVT (supraventricular tachycardia) (HCC)     Past Surgical History:  Procedure Laterality Date  . ABDOMINAL HYSTERECTOMY  1988  . BREAST BIOPSY  1948  . BREAST LUMPECTOMY    . CARDIOVASCULAR STRESS TEST  03/08/2009   EF 84%  . TONSILLECTOMY AND ADENOIDECTOMY    . US ECHOCARDIOGRAPHY  01/17/2003   EF 55-60%    Social History Alexandra Reyes  reports that she quit smoking about 21 years ago. Her smoking use included Cigarettes. She started smoking about 56 years ago. She has a 54.00 pack-year smoking history. She has never used smokeless tobacco. She reports that she does not drink alcohol or use drugs.  family history includes Arthritis in her mother; Heart disease in her father; Heart failure in her mother; Hypertension in her father; Kidney failure in her father.  Allergies  Allergen Reactions  . Lisinopril     Unknown Pt states she's never taken Lisinopril  . Pneumovax 23 [Pneumococcal Vac Polyvalent] Other (See Comments)    Flu like symtpoms for 1 day  . Symbicort [Budesonide-Formoterol Fumarate]  Jittery, SOB, cough       PHYSICAL EXAMINATION: Vital signs: BP (!) 114/54   Pulse 60   Ht 5' 1.42" (1.56 m) Comment: w/o shoes  Wt 150 lb 2 oz (68.1 kg)   BMI 27.98 kg/m   Constitutional: Delightful and generally well-appearing elderly female, no acute distress Psychiatric: alert and  oriented x3, cooperative Eyes: extraocular movements intact, anicteric, conjunctiva pink Mouth: oral pharynx moist, no lesions Neck: supple no lymphadenopathy Cardiovascular: heart regular rate and rhythm, no murmur Lungs: clear to auscultation bilaterally Abdomen: soft, nontender, nondistended, no obvious ascites, no peritoneal signs, normal bowel sounds, no organomegaly Rectal: Omitted Extremities: no clubbing cyanosis or lower extremity edema bilaterally. Support stockings in place Skin: no lesions on visible extremities Neuro: No focal deficits. Cranial nerves intact  ASSESSMENT:  #1. Chest pain issues as described. Not clear to me that this is necessarily related to GERD by history. However, We should treated as such to see if symptoms improve. #2. Chronic cough with phlegm. Again, not clear to me that this is GERD but is possible. We will treat her as such to see if symptoms improve. #3. Mild GERD by history remotely. Generally responsive to acids  PLAN:  #1. Strict adherence to reflux precautions. Reviewed #2. Stop Zantac #3. Initiate omeprazole 40 mg daily. I have asked her to take this daily for 2 months. If symptoms do not improve, then unlikely to be GERD and she should stop it. However, if symptoms improve she could continue on or stop it and see if symptoms return in which case she could resume therapy. No indication for EGD. She will follow up here as needed

## 2016-01-24 NOTE — Patient Instructions (Addendum)
If you are age 80 or older, your body mass index should be between 23-30. Your Body mass index is 27.98 kg/m. If this is out of the aforementioned range listed, please consider follow up with your Primary Care Provider.  If you are age 80 or younger, your body mass index should be between 19-25. Your Body mass index is 27.98 kg/m. If this is out of the aformentioned range listed, please consider follow up with your Primary Care Provider.   We have sent the following medications to your pharmacy for you to pick up at your convenience: Omeprazole 40 mg 1 a day  Follow up as needed.  Thank you for choosing [facility]  Dr Yancey FlemingsJohn Perry

## 2016-01-31 DIAGNOSIS — F4329 Adjustment disorder with other symptoms: Secondary | ICD-10-CM | POA: Diagnosis not present

## 2016-02-01 DIAGNOSIS — H353222 Exudative age-related macular degeneration, left eye, with inactive choroidal neovascularization: Secondary | ICD-10-CM | POA: Diagnosis not present

## 2016-02-01 DIAGNOSIS — H353211 Exudative age-related macular degeneration, right eye, with active choroidal neovascularization: Secondary | ICD-10-CM | POA: Diagnosis not present

## 2016-02-09 ENCOUNTER — Telehealth: Payer: Self-pay | Admitting: Internal Medicine

## 2016-02-09 NOTE — Telephone Encounter (Signed)
Returned call to patient.  I let her know she will need to come in for her OV on 02/14/16 and Dr [name] will order what needs to be drawn at that time

## 2016-02-09 NOTE — Telephone Encounter (Signed)
Called pt to confirm appt. States She feels like she needs blood work before [facility] appt 02-14-16, having episodes of nausea , fingernails are white, and she has lost about 5 lbs. Pls advise

## 2016-02-14 ENCOUNTER — Encounter: Payer: Self-pay | Admitting: Internal Medicine

## 2016-02-14 ENCOUNTER — Ambulatory Visit (INDEPENDENT_AMBULATORY_CARE_PROVIDER_SITE_OTHER): Payer: Medicare Other | Admitting: Internal Medicine

## 2016-02-14 VITALS — BP 136/75 | HR 63 | Ht 63.0 in | Wt 149.2 lb

## 2016-02-14 DIAGNOSIS — I471 Supraventricular tachycardia: Secondary | ICD-10-CM | POA: Diagnosis not present

## 2016-02-14 DIAGNOSIS — Z79899 Other long term (current) drug therapy: Secondary | ICD-10-CM | POA: Diagnosis not present

## 2016-02-14 LAB — T4, FREE: Free T4: 1.7 ng/dL (ref 0.8–1.8)

## 2016-02-14 LAB — HEPATIC FUNCTION PANEL
ALBUMIN: 3.9 g/dL (ref 3.6–5.1)
ALT: 22 U/L (ref 6–29)
AST: 21 U/L (ref 10–35)
Alkaline Phosphatase: 62 U/L (ref 33–130)
Bilirubin, Direct: 0.1 mg/dL (ref <=0.2)
Indirect Bilirubin: 0.4 mg/dL (ref 0.2–1.2)
TOTAL PROTEIN: 6.5 g/dL (ref 6.1–8.1)
Total Bilirubin: 0.5 mg/dL (ref 0.2–1.2)

## 2016-02-14 LAB — CBC WITH DIFFERENTIAL/PLATELET
BASOS ABS: 0 {cells}/uL (ref 0–200)
Basophils Relative: 0 %
EOS ABS: 85 {cells}/uL (ref 15–500)
Eosinophils Relative: 1 %
HCT: 41.4 % (ref 35.0–45.0)
HEMOGLOBIN: 14 g/dL (ref 11.7–15.5)
LYMPHS ABS: 850 {cells}/uL (ref 850–3900)
Lymphocytes Relative: 10 %
MCH: 29.4 pg (ref 27.0–33.0)
MCHC: 33.8 g/dL (ref 32.0–36.0)
MCV: 86.8 fL (ref 80.0–100.0)
MONO ABS: 935 {cells}/uL (ref 200–950)
MPV: 8.8 fL (ref 7.5–12.5)
Monocytes Relative: 11 %
NEUTROS ABS: 6630 {cells}/uL (ref 1500–7800)
Neutrophils Relative %: 78 %
Platelets: 350 10*3/uL (ref 140–400)
RBC: 4.77 MIL/uL (ref 3.80–5.10)
RDW: 14.3 % (ref 11.0–15.0)
WBC: 8.5 10*3/uL (ref 3.8–10.8)

## 2016-02-14 LAB — BASIC METABOLIC PANEL
BUN: 16 mg/dL (ref 7–25)
CHLORIDE: 98 mmol/L (ref 98–110)
CO2: 25 mmol/L (ref 20–31)
Calcium: 9.2 mg/dL (ref 8.6–10.4)
Creat: 1.11 mg/dL — ABNORMAL HIGH (ref 0.60–0.88)
Glucose, Bld: 86 mg/dL (ref 65–99)
Potassium: 4.4 mmol/L (ref 3.5–5.3)
SODIUM: 135 mmol/L (ref 135–146)

## 2016-02-14 LAB — TSH: TSH: 1.42 mIU/L

## 2016-02-14 MED ORDER — AMIODARONE HCL 200 MG PO TABS: 100.0000 mg | ORAL_TABLET | Freq: Every day | ORAL | Status: DC

## 2016-02-14 NOTE — Patient Instructions (Signed)
Medication Instructions:  Your physician recommends that you continue on your current medications as directed. Please refer to the Current Medication list given to you today.   Labwork: Your physician recommends that you return for lab work today:BMP/CBC/Liver/TSH/T4    Testing/Procedures: None ordered   Follow-Up: Your physician wants you to follow-up in: 6 months with Dr [name] Bonita QuinYou will receive a reminder letter in the mail two months in advance. If you don't receive a letter, please call our office to schedule the follow-up appointment.   Any Other Special Instructions Will Be Listed Below (If Applicable).     If you need a refill on your cardiac medications before your next appointment, please call your pharmacy.

## 2016-02-14 NOTE — Progress Notes (Signed)
Electrophysiology Office Note   Date:  02/14/2016   ID:  Alexandra Reyes, DOB [birth year], MRN [id number]  PCP:  Alexandra BarreJames [name], MD  Cardiologist:  Dr Anne FuSkains, (previously [name]) Primary Electrophysiologist: Hillis RangeJames Atticus Wedin, MD    Chief Complaint  Patient presents with  . Follow-up    PSVT     History of Present Illness: Alexandra Reyes is a 82 y.o. female who presents today for electrophysiology evaluation.  No further atach since I saw her last.  She continues to have occasional exertional SOB for which she is now following with pulmonary.  Chest wall pain is also stable.   Past Medical History:  Diagnosis Date  . Alcohol dependence in remission (HCC) 05/22/2011  . Allergic rhinitis, cause unspecified 05/22/2011  . Allergy   . Anemia, iron deficiency 05/18/2011  . Cholelithiasis   . COPD (chronic obstructive pulmonary disease) (HCC)   . DDD (degenerative disc disease), lumbar 05/18/2011  . First degree AV block   . GERD (gastroesophageal reflux disease)   . Hiatal hernia   . HTN (hypertension) 05/18/2011  . Hyperlipidemia 05/18/2011  . Macular degeneration   . Myocardial infarction (HCC)   . OA (osteoarthritis)   . Osteoporosis 05/18/2011  . PAT (paroxysmal atrial tachycardia) (HCC)   . Pectus excavatum 05/18/2011  . Personal history of colonic polyps 10/28/2012  . SVT (supraventricular tachycardia) (HCC)    Past Surgical History:  Procedure Laterality Date  . ABDOMINAL HYSTERECTOMY  1988  . BREAST BIOPSY  1948  . BREAST LUMPECTOMY    . CARDIOVASCULAR STRESS TEST  03/08/2009   EF 84%  . TONSILLECTOMY AND ADENOIDECTOMY    . US ECHOCARDIOGRAPHY  01/17/2003   EF 55-60%     Current Outpatient Prescriptions  Medication Sig Dispense Refill  . acetaminophen (TYLENOL) 325 MG tablet Take 650 mg by mouth every 6 (six) hours as needed (joint pain).    Marland Kitchen. ALPRAZolam (XANAX) 0.25 MG tablet Take 1 tablet (0.25 mg total) by mouth at bedtime as needed for anxiety. 30 tablet 5  .  amiodarone (PACERONE) 200 MG tablet Take 0.5 tablets (100 mg total) by mouth daily.    Marland Kitchen. amLODipine (NORVASC) 2.5 MG tablet TAKE 1 TABLET BY MOUTH EVERY DAY 90 tablet 3  . Ascorbic Acid (VITAMIN C PO) Take 1 tablet by mouth daily. Reported on 08/08/2015    . aspirin 81 MG tablet Take 81 mg by mouth daily.      . B Complex Vitamins (VITAMIN B COMPLEX PO) Take 1 tablet by mouth daily.     . budesonide-formoterol (SYMBICORT) 80-4.5 MCG/ACT inhaler Inhale 1 puff into the lungs 2 (two) times daily. 1 Inhaler 3  . calcium carbonate (OS-CAL) 600 MG tablet Take 600 mg by mouth daily.    Marland Kitchen. diltiazem (CARDIZEM CD) 120 MG 24 hr capsule Take 1 capsule by mouth daily.    Marland Kitchen. diltiazem (CARDIZEM) 30 MG tablet Take 1-2 tablets by mouth every 6 hours as needed for fast heart rates 30 tablet 1  . docusate sodium (COLACE) 100 MG capsule Take 100 mg by mouth as needed for mild constipation.    . ferrous sulfate 325 (65 FE) MG tablet Take 325 mg by mouth daily.     . furosemide (LASIX) 40 MG tablet Take 1 tablet (40 mg total) by mouth daily. 90 tablet 3  . Glucosamine HCl-MSM (GLUCOSAMINE-MSM PO) Take 2 tablets by mouth daily.     . nitroGLYCERIN (NITROSTAT) 0.4 MG SL tablet Place 1 tablet (  0.4 mg total) under the tongue every 5 (five) minutes as needed for chest pain. 25 tablet 3  . omeprazole (PRILOSEC) 40 MG capsule Take 1 capsule (40 mg total) by mouth daily. 30 capsule 6  . ranitidine (ZANTAC) 75 MG tablet Take 75 mg by mouth at bedtime.    . simvastatin (ZOCOR) 10 MG tablet Take 1 tablet (10 mg total) by mouth daily at 6 PM. 90 tablet 3  . triamcinolone cream (KENALOG) 0.1 % Apply 1 application topically 2 (two) times daily. Apply to affected area    . vitamin E 200 UNIT capsule Take 200 Units by mouth daily.     No current facility-administered medications for this visit.     Allergies:   Lisinopril; Pneumovax 23 [pneumococcal vac polyvalent]; and Symbicort [budesonide-formoterol fumarate]   Social History:   The patient  reports that she quit smoking about 21 years ago. Her smoking use included Cigarettes. She started smoking about 56 years ago. She has a 54.00 pack-year smoking history. She has never used smokeless tobacco. She reports that she does not drink alcohol or use drugs.   Family History:  The patient's family history includes Arthritis in her mother; Heart disease in her father; Heart failure in her mother; Hypertension in her father; Kidney failure in her father.    ROS:  Please see the history of present illness.   All other systems are reviewed and negative.    PHYSICAL EXAM: VS:  BP 136/75   Pulse 63   Ht 5\' 3"  (1.6 m)   Wt 149 lb 3.2 oz (67.7 kg)   SpO2 96%   BMI 26.43 kg/m  , BMI Body mass index is 26.43 kg/m. GEN: elderly, in no acute distress  HEENT: normal  Neck: no JVD, carotid bruits, or masses Cardiac: RRR; no murmurs, rubs, or gallops,+1 edema , support hose in place Respiratory:  clear to auscultation bilaterally, normal work of breathing GI: soft, nontender, nondistended, + BS MS: no deformity or atrophy  Skin: warm and dry, leukonychia noted Neuro:  Strength and sensation are intact Psych: euthymic mood, full affect  EKG:  Ordered today reveals sinus rhythm 60 bpm, PR 244 msec, anterolateral infarct, LVH inferior infarct   Recent Labs: 05/24/2015: TSH 3.969 08/02/2015: Hemoglobin 14.4; Platelets 390.0 02/14/2016: ALT 22; BUN 16; Creat 1.11; Potassium 4.4; Sodium 135    Lipid Panel     Component Value Date/Time   CHOL 167 05/24/2015 0831   TRIG 71 05/24/2015 0831   HDL 84 05/24/2015 0831   CHOLHDL 2.0 05/24/2015 0831   VLDL 14 05/24/2015 0831   LDLCALC 69 05/24/2015 0831   LDLDIRECT 129.2 05/15/2011 0925     Wt Readings from Last 3 Encounters:  02/14/16 149 lb 3.2 oz (67.7 kg)  01/24/16 150 lb 2 oz (68.1 kg)  01/17/16 151 lb (68.5 kg)     ASSESSMENT AND PLAN:  1.  SVT Long RP,  Likely atach with first degree AV block.  Presently well  controlled with amiodarone.  Will reduce amiodarone to 100mg  daily today.  Her SOB proceeded initiation of amiodarone. She does not feel that it was worsened by this medicine. Check LFTs, TFTs today  2. HTN Stable No change required today bmet ordered  3. Venous insufficiency Support hose  4. Leukonychia I spoke with her dermatologist today.  She is unaware of any syndromes of leukonychia that would explain SOB in this patient.  She did advise LFTs and TFTs which I have ordered today.  Return to see me in 6 months Follow-up with Dr Anne Fu as scheduled  Signed, Hillis Range, MD  02/14/2016 5:48 PM     [facility] [address] [place] [address] [phone] (office) [phone] (fax)

## 2016-02-19 ENCOUNTER — Telehealth: Payer: Self-pay | Admitting: Internal Medicine

## 2016-02-19 NOTE — Telephone Encounter (Signed)
Returned call to patient and went over her labs in detail

## 2016-02-19 NOTE — Telephone Encounter (Signed)
Follow Up:       Please call,further questions about her test results.

## 2016-02-20 ENCOUNTER — Other Ambulatory Visit: Payer: Self-pay | Admitting: Internal Medicine

## 2016-02-28 ENCOUNTER — Ambulatory Visit: Payer: Medicare Other | Admitting: Pulmonary Disease

## 2016-03-06 DIAGNOSIS — F4329 Adjustment disorder with other symptoms: Secondary | ICD-10-CM | POA: Diagnosis not present

## 2016-03-13 ENCOUNTER — Ambulatory Visit (INDEPENDENT_AMBULATORY_CARE_PROVIDER_SITE_OTHER): Payer: Medicare Other | Admitting: Internal Medicine

## 2016-03-13 VITALS — BP 142/82 | HR 62 | Temp 97.8°F | Resp 20 | Wt 147.4 lb

## 2016-03-13 DIAGNOSIS — J449 Chronic obstructive pulmonary disease, unspecified: Secondary | ICD-10-CM

## 2016-03-13 DIAGNOSIS — K5900 Constipation, unspecified: Secondary | ICD-10-CM | POA: Diagnosis not present

## 2016-03-13 DIAGNOSIS — J479 Bronchiectasis, uncomplicated: Secondary | ICD-10-CM | POA: Insufficient documentation

## 2016-03-13 DIAGNOSIS — F329 Major depressive disorder, single episode, unspecified: Secondary | ICD-10-CM | POA: Diagnosis not present

## 2016-03-13 DIAGNOSIS — I10 Essential (primary) hypertension: Secondary | ICD-10-CM | POA: Diagnosis not present

## 2016-03-13 DIAGNOSIS — E785 Hyperlipidemia, unspecified: Secondary | ICD-10-CM

## 2016-03-13 DIAGNOSIS — F32A Depression, unspecified: Secondary | ICD-10-CM

## 2016-03-13 NOTE — Patient Instructions (Addendum)
Ok to take the colace 100 mg twice per day, and the OTC Miralax daily (17 gm by mouth in water)  Please continue all other medications as before, and refills have been done if requested.  Please have the pharmacy call with any other refills you may need.  Please continue your efforts at being more active, low cholesterol diet, and weight control.  Please keep your appointments with your specialists as you may have planned  Please return in 6 months, or sooner if needed

## 2016-03-13 NOTE — Progress Notes (Signed)
Subjective:    Patient ID: Alexandra Reyes, female    DOB: 12-Nov-1925, 82 y.o.   MRN: [id number]  HPI  Here to f/u; overall doing ok,  Pt denies new chest pain, increasing sob or doe, wheezing, orthopnea, PND, increased LE swelling, palpitations, dizziness or syncope.  Pt denies new neurological symptoms such as new headache, or facial or extremity weakness or numbness.  Pt denies polydipsia, polyuria, or low sugar episode.   Pt denies new neurological symptoms such as new headache, or facial or extremity weakness or numbness.   Pt states overall good compliance with meds, mostly trying to follow appropriate diet, with wt overall stable,  but little exercise however.  Has seen pulm twice, cardiology/Dr Anne FuSkains, and GI Dr Marina GoodellPerry  for f/u persistent reflux and cough, but pain likely MSK without reflux per GI. Salonspa OTC helps with pain, as well as sitting still.  Amio decresaed per Dr [name] as well, no known pulm side effects.  Was suggested for cardiac cath per pt per Dr [name] at one point, but pt deferred. Still has prod cough with "plugs" thick mucous and stringy, and pt has f/u appt with pulm in 2 wks.   Pt denies fever, wt loss, night sweats, loss of appetite, or other constitutional symptoms.  CT chest per radiology c/w bronchiectasis bilat LL, which pt was unaware.  Has lost several lbs recently.   Pt denies fever, night sweats, loss of appetite (but eating less due to nausea at night), or other constitutional symptoms  Also with recent worsening consiptation for the past year, depsite colace and prunes Wt Readings from Last 3 Encounters:  03/13/16 147 lb 6 oz (66.8 kg)  02/14/16 149 lb 3.2 oz (67.7 kg)  01/24/16 150 lb 2 oz (68.1 kg)   Past Medical History:  Diagnosis Date  . Alcohol dependence in remission (HCC) 05/22/2011  . Allergic rhinitis, cause unspecified 05/22/2011  . Allergy   . Anemia, iron deficiency 05/18/2011  . Cholelithiasis   . COPD (chronic obstructive pulmonary disease)  (HCC)   . DDD (degenerative disc disease), lumbar 05/18/2011  . First degree AV block   . GERD (gastroesophageal reflux disease)   . Hiatal hernia   . HTN (hypertension) 05/18/2011  . Hyperlipidemia 05/18/2011  . Macular degeneration   . Myocardial infarction   . OA (osteoarthritis)   . Osteoporosis 05/18/2011  . PAT (paroxysmal atrial tachycardia) (HCC)   . Pectus excavatum 05/18/2011  . Personal history of colonic polyps 10/28/2012  . SVT (supraventricular tachycardia) (HCC)    Past Surgical History:  Procedure Laterality Date  . ABDOMINAL HYSTERECTOMY  1988  . BREAST BIOPSY  1948  . BREAST LUMPECTOMY    . CARDIOVASCULAR STRESS TEST  03/08/2009   EF 84%  . TONSILLECTOMY AND ADENOIDECTOMY    . US ECHOCARDIOGRAPHY  01/17/2003   EF 55-60%    reports that she quit smoking about 21 years ago. Her smoking use included Cigarettes. She started smoking about 57 years ago. She has a 54.00 pack-year smoking history. She has never used smokeless tobacco. She reports that she does not drink alcohol or use drugs. family history includes Arthritis in her mother; Heart disease in her father; Heart failure in her mother; Hypertension in her father; Kidney failure in her father. Allergies  Allergen Reactions  . Lisinopril     Unknown Pt states she's never taken Lisinopril  . Pneumovax 23 [Pneumococcal Vac Polyvalent] Other (See Comments)    Flu like symtpoms for  1 day  . Symbicort [Budesonide-Formoterol Fumarate]     Jittery, SOB, cough   Current Outpatient Prescriptions on File Prior to Visit  Medication Sig Dispense Refill  . acetaminophen (TYLENOL) 325 MG tablet Take 650 mg by mouth every 6 (six) hours as needed (joint pain).    Marland Kitchen ALPRAZolam (XANAX) 0.25 MG tablet Take 1 tablet (0.25 mg total) by mouth at bedtime as needed for anxiety. 30 tablet 5  . amiodarone (PACERONE) 200 MG tablet Take 0.5 tablets (100 mg total) by mouth daily.    Marland Kitchen amLODipine (NORVASC) 2.5 MG tablet TAKE 1 TABLET BY MOUTH  EVERY DAY 90 tablet 3  . Ascorbic Acid (VITAMIN C PO) Take 1 tablet by mouth daily. Reported on 08/08/2015    . aspirin 81 MG tablet Take 81 mg by mouth daily.      . B Complex Vitamins (VITAMIN B COMPLEX PO) Take 1 tablet by mouth daily.     . budesonide-formoterol (SYMBICORT) 80-4.5 MCG/ACT inhaler Inhale 1 puff into the lungs 2 (two) times daily. 1 Inhaler 3  . calcium carbonate (OS-CAL) 600 MG tablet Take 600 mg by mouth daily.    Marland Kitchen CARTIA XT 120 MG 24 hr capsule TAKE 1 CAPSULE BY MOUTH DAILY 90 capsule 1  . diltiazem (CARDIZEM) 30 MG tablet Take 1-2 tablets by mouth every 6 hours as needed for fast heart rates 30 tablet 1  . docusate sodium (COLACE) 100 MG capsule Take 100 mg by mouth as needed for mild constipation.    . ferrous sulfate 325 (65 FE) MG tablet Take 325 mg by mouth daily.     . furosemide (LASIX) 40 MG tablet Take 1 tablet (40 mg total) by mouth daily. 90 tablet 3  . Glucosamine HCl-MSM (GLUCOSAMINE-MSM PO) Take 2 tablets by mouth daily.     . nitroGLYCERIN (NITROSTAT) 0.4 MG SL tablet Place 1 tablet (0.4 mg total) under the tongue every 5 (five) minutes as needed for chest pain. 25 tablet 3  . omeprazole (PRILOSEC) 40 MG capsule Take 1 capsule (40 mg total) by mouth daily. 30 capsule 6  . ranitidine (ZANTAC) 75 MG tablet Take 75 mg by mouth at bedtime.    . simvastatin (ZOCOR) 10 MG tablet Take 1 tablet (10 mg total) by mouth daily at 6 PM. 90 tablet 3  . triamcinolone cream (KENALOG) 0.1 % Apply 1 application topically 2 (two) times daily. Apply to affected area    . vitamin E 200 UNIT capsule Take 200 Units by mouth daily.     No current facility-administered medications on file prior to visit.    Review of Systems  Constitutional: Negative for unusual diaphoresis or night sweats HENT: Negative for ear swelling or discharge Eyes: Negative for worsening visual haziness  Respiratory: Negative for choking and stridor.   Gastrointestinal: Negative for distension or  worsening eructation Genitourinary: Negative for retention or change in urine volume.  Musculoskeletal: Negative for other MSK pain or swelling Skin: Negative for color change and worsening wound Neurological: Negative for tremors and numbness other than noted  Psychiatric/Behavioral: Negative for decreased concentration or agitation other than above       Objective:   Physical Exam BP (!) 142/82   Pulse 62   Temp 97.8 F (36.6 C) (Oral)   Resp 20   Wt 147 lb 6 oz (66.8 kg)   SpO2 96%   BMI 26.11 kg/m  VS noted,  Constitutional: Pt appears in no apparent distress HENT: Head: NCAT.  Right Ear: External ear normal.  Left Ear: External ear normal.  Eyes: . Pupils are equal, round, and reactive to light. Conjunctivae and EOM are normal Neck: Normal range of motion. Neck supple.  Cardiovascular: Normal rate and regular rhythm.   Pulmonary/Chest: Effort normal and breath sounds without rales or wheezing.  Abd:  Soft, NT, ND, + BS Neurological: Pt is alert. Not confused , motor grossly intact Skin: Skin is warm. No rash, no LE edema Psychiatric: Pt behavior is normal. No agitation. not depressed affect    Assessment & Plan:

## 2016-03-13 NOTE — Assessment & Plan Note (Signed)
Noted on CT may 2017, bilat LL -  Exam benign but ? Whether related to chronic cough

## 2016-03-13 NOTE — Progress Notes (Signed)
Pre visit review using our clinic review tool, if applicable. No additional management support is needed unless otherwise documented below in the visit note. 

## 2016-03-16 NOTE — Assessment & Plan Note (Signed)
stable overall by history and exam, recent data reviewed with pt, and pt to continue medical treatment as before,  to f/u any worsening symptoms or concerns @LASTSAO2(3)@  

## 2016-03-16 NOTE — Assessment & Plan Note (Addendum)
Mild to mod, for colace/miralax asd,  to f/u any worsening symptoms or concerns  Note:  Total time for pt hx, exam, review of record with pt in the room, determination of diagnoses and plan for further eval and tx is > 40 min, with over 50% spent in coordination and counseling of patient

## 2016-03-16 NOTE — Assessment & Plan Note (Signed)
stable overall by history and exam, recent data reviewed with pt, and pt to continue medical treatment as before,  to f/u any worsening symptoms or concerns Lab Results  Component Value Date   LDLCALC 69 05/24/2015

## 2016-03-16 NOTE — Assessment & Plan Note (Signed)
stable overall by history and exam, recent data reviewed with pt, and pt to continue medical treatment as before,  to f/u any worsening symptoms or concerns Lab Results  Component Value Date   WBC 8.5 02/14/2016   HGB 14.0 02/14/2016   HCT 41.4 02/14/2016   PLT 350 02/14/2016   GLUCOSE 86 02/14/2016   CHOL 167 05/24/2015   TRIG 71 05/24/2015   HDL 84 05/24/2015   LDLDIRECT 129.2 05/15/2011   LDLCALC 69 05/24/2015   ALT 22 02/14/2016   AST 21 02/14/2016   NA 135 02/14/2016   K 4.4 02/14/2016   CL 98 02/14/2016   CREATININE 1.11 (H) 02/14/2016   BUN 16 02/14/2016   CO2 25 02/14/2016   TSH 1.42 02/14/2016   INR 1.0 07/13/2007   HGBA1C  07/13/2007    5.8 (NOTE)   The ADA recommends the following therapeutic goals for glycemic   control related to Hgb A1C measurement:   Goal of Therapy:   < 7.0% Hgb A1C   Action Suggested:  > 8.0% Hgb A1C   Ref:  Diabetes Care, 22, Suppl. 1, 1999

## 2016-03-16 NOTE — Assessment & Plan Note (Signed)
stable overall by history and exam, recent data reviewed with pt, declienes further med chagnes, and pt to continue medical treatment as before,  to f/u any worsening symptoms or concerns BP Readings from Last 3 Encounters:  03/13/16 (!) 142/82  02/14/16 136/75  01/24/16 (!) 114/54

## 2016-03-16 NOTE — Assessment & Plan Note (Signed)
>>  ASSESSMENT AND PLAN FOR HTN (HYPERTENSION) WRITTEN ON 03/16/2016  8:37 PM BY Corwin Levins, MD  stable overall by history and exam, recent data reviewed with pt, declienes further med chagnes, and pt to continue medical treatment as before,  to f/u any worsening symptoms or concerns BP Readings from Last 3 Encounters:  03/13/16 (!) 142/82  02/14/16 136/75  01/24/16 (!) 114/54

## 2016-03-22 DIAGNOSIS — H43813 Vitreous degeneration, bilateral: Secondary | ICD-10-CM | POA: Diagnosis not present

## 2016-03-22 DIAGNOSIS — H353222 Exudative age-related macular degeneration, left eye, with inactive choroidal neovascularization: Secondary | ICD-10-CM | POA: Diagnosis not present

## 2016-03-22 DIAGNOSIS — H353211 Exudative age-related macular degeneration, right eye, with active choroidal neovascularization: Secondary | ICD-10-CM | POA: Diagnosis not present

## 2016-03-22 DIAGNOSIS — H43392 Other vitreous opacities, left eye: Secondary | ICD-10-CM | POA: Diagnosis not present

## 2016-03-27 ENCOUNTER — Ambulatory Visit (INDEPENDENT_AMBULATORY_CARE_PROVIDER_SITE_OTHER): Payer: Medicare Other | Admitting: Pulmonary Disease

## 2016-03-27 ENCOUNTER — Encounter: Payer: Self-pay | Admitting: Pulmonary Disease

## 2016-03-27 VITALS — BP 144/72 | HR 66 | Ht 64.0 in | Wt 146.0 lb

## 2016-03-27 DIAGNOSIS — R0602 Shortness of breath: Secondary | ICD-10-CM

## 2016-03-27 DIAGNOSIS — R059 Cough, unspecified: Secondary | ICD-10-CM

## 2016-03-27 DIAGNOSIS — R05 Cough: Secondary | ICD-10-CM

## 2016-03-27 DIAGNOSIS — R0789 Other chest pain: Secondary | ICD-10-CM | POA: Diagnosis not present

## 2016-03-27 NOTE — Progress Notes (Signed)
Subjective:    Patient ID: Alexandra Reyes, female    DOB: 06-13-1925, 82 y.o.   MRN: [id number]  C.C.:  Follow-up for Dyspnea w/ Cough & GERD.  HPI Dyspnea w/ Cough:  On Symbicort 80/4.5 at last appointment. She is continuing to produce a clear mucus that is unchanged.  GERD: Referred to GI and previously on Zantac. She was switched over to Prilosec daily for 2 months. Dr. Marina GoodellPerry did not feel reflux was the likely etiology.   Review of Systems No fever, chills, or sweats. She reports she has continued to have pain in her right lower chest wall with radiation to low sternum.   Allergies  Allergen Reactions  . Lisinopril     Unknown Pt states she's never taken Lisinopril  . Pneumovax 23 [Pneumococcal Vac Polyvalent] Other (See Comments)    Flu like symtpoms for 1 day  . Symbicort [Budesonide-Formoterol Fumarate]     Jittery, SOB, cough    Current Outpatient Prescriptions on File Prior to Visit  Medication Sig Dispense Refill  . acetaminophen (TYLENOL) 325 MG tablet Take 650 mg by mouth every 6 (six) hours as needed (joint pain).    Marland Kitchen. ALPRAZolam (XANAX) 0.25 MG tablet Take 1 tablet (0.25 mg total) by mouth at bedtime as needed for anxiety. 30 tablet 5  . amiodarone (PACERONE) 200 MG tablet Take 0.5 tablets (100 mg total) by mouth daily.    Marland Kitchen. amLODipine (NORVASC) 2.5 MG tablet TAKE 1 TABLET BY MOUTH EVERY DAY 90 tablet 3  . Ascorbic Acid (VITAMIN C PO) Take 1 tablet by mouth daily. Reported on 08/08/2015    . aspirin 81 MG tablet Take 81 mg by mouth daily.      . B Complex Vitamins (VITAMIN B COMPLEX PO) Take 1 tablet by mouth daily.     . budesonide-formoterol (SYMBICORT) 80-4.5 MCG/ACT inhaler Inhale 1 puff into the lungs 2 (two) times daily. 1 Inhaler 3  . calcium carbonate (OS-CAL) 600 MG tablet Take 600 mg by mouth daily.    Marland Kitchen. CARTIA XT 120 MG 24 hr capsule TAKE 1 CAPSULE BY MOUTH DAILY 90 capsule 1  . diltiazem (CARDIZEM) 30 MG tablet Take 1-2 tablets by mouth every 6 hours as  needed for fast heart rates 30 tablet 1  . docusate sodium (COLACE) 100 MG capsule Take 100 mg by mouth as needed for mild constipation.    . ferrous sulfate 325 (65 FE) MG tablet Take 325 mg by mouth daily.     . furosemide (LASIX) 40 MG tablet Take 1 tablet (40 mg total) by mouth daily. 90 tablet 3  . Glucosamine HCl-MSM (GLUCOSAMINE-MSM PO) Take 2 tablets by mouth daily.     . nitroGLYCERIN (NITROSTAT) 0.4 MG SL tablet Place 1 tablet (0.4 mg total) under the tongue every 5 (five) minutes as needed for chest pain. 25 tablet 3  . omeprazole (PRILOSEC) 40 MG capsule Take 1 capsule (40 mg total) by mouth daily. 30 capsule 6  . simvastatin (ZOCOR) 10 MG tablet Take 1 tablet (10 mg total) by mouth daily at 6 PM. 90 tablet 3  . triamcinolone cream (KENALOG) 0.1 % Apply 1 application topically 2 (two) times daily. Apply to affected area    . vitamin E 200 UNIT capsule Take 200 Units by mouth daily.     No current facility-administered medications on file prior to visit.     Past Medical History:  Diagnosis Date  . Alcohol dependence in remission (HCC) 05/22/2011  .  Allergic rhinitis, cause unspecified 05/22/2011  . Allergy   . Anemia, iron deficiency 05/18/2011  . Cholelithiasis   . COPD (chronic obstructive pulmonary disease) (HCC)   . DDD (degenerative disc disease), lumbar 05/18/2011  . First degree AV block   . GERD (gastroesophageal reflux disease)   . Hiatal hernia   . HTN (hypertension) 05/18/2011  . Hyperlipidemia 05/18/2011  . Macular degeneration   . Myocardial infarction   . OA (osteoarthritis)   . Osteoporosis 05/18/2011  . PAT (paroxysmal atrial tachycardia) (HCC)   . Pectus excavatum 05/18/2011  . Personal history of colonic polyps 10/28/2012  . SVT (supraventricular tachycardia) (HCC)     Past Surgical History:  Procedure Laterality Date  . ABDOMINAL HYSTERECTOMY  1988  . BREAST BIOPSY  1948  . BREAST LUMPECTOMY    . CARDIOVASCULAR STRESS TEST  03/08/2009   EF 84%  .  TONSILLECTOMY AND ADENOIDECTOMY    . US ECHOCARDIOGRAPHY  01/17/2003   EF 55-60%    Family History  Problem Relation Age of Onset  . Heart failure Mother   . Arthritis Mother   . Kidney failure Father   . Heart disease Father   . Hypertension Father     Social History   Social History  . Marital status: Widowed    Spouse name: N/A  . Number of children: Y  . Years of education: 48   Occupational History  . Retired Designer, jewellery    Social History Main Topics  . Smoking status: Former Smoker    Packs/day: 1.50    Years: 36.00    Types: Cigarettes    Start date: 03/01/1959    Quit date: 06/10/1994  . Smokeless tobacco: Never Used     Comment: smoked 1- 1.5 ppd.   . Alcohol use No  . Drug use: No  . Sexual activity: Not Asked   Other Topics Concern  . None   Social History Narrative   Originally from [place], [place]. She moved to [place] in 1950 during the polio epidemic. 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 Remote bird exposure. No mold or hot tub exposure.       Objective:   Physical Exam BP (!) 144/72 (BP Location: Right Arm, Cuff Size: Normal)   Pulse 66   Ht 5\' 4"  (1.626 m)   Wt 146 lb (66.2 kg)   SpO2 99%   BMI 25.06 kg/m  General:  Awake. Alert. Well-dressed. Comfortable. Integument:  Warm & dry. No rash on exposed skin.  Lymphatics:  No appreciated cervical or supraclavicular lymphadenoapthy. HEENT:  Moist mucus membranes. Mild bilateral nasal turbinate swelling with pale mucosa. No oral ulcers. Mallampati class III. Cardiovascular:  Regular rate & rhythm. No edema.Normal S1 & S2.  Pulmonary:  No accessory muscle use on room air. Clear on auscultation. Speaking in complete sentences. Mild intermittent cough.  PFT 07/12/15: FVC 2.29 L (106%) FEV1 1.57 L (99%) FEV1/FVC 0.68 FEF 25-75 0.78 L (81%) no bronchodilator response TLC 3.59 L (73%) RV 52% ERV 342% DLCO uncorrected 52% 05/14/13: FVC 2.34 L (103%) FEV1 1.63 L (97%) FEV1/FVC 0.69 FEF 25-75  0.83 L (76%) no bronchodilator response TLC 4.51 L (92%) ERV 284% DLCO uncorrected 55%  IMAGING CT CHEST W/O 10/09/15 (previously reviewed by me):  No pneumothorax or effusion. Minimal posterior subsegmental atelectasis. No appreciable bronchiectasis, mass, or nodule. No pathologic mediastinal adenopathy. No pericardial effusion. Mild coronary artery calcification.  BARIUM SWALLOW (08/11/15): No evidence for stricture or mass lesion. Moderate  esophageal dysmotility without hiatal hernia. Small amount spontaneous reflux into the lower third of the esophagus.  CTA CHEST 08/03/15 (previously reviewed by me): Mild lower lobe predominant atelectasis. No other nodule or opacity appreciated. No ulnar emboli. No pleural effusion or thickening. No pericardial effusion. No pathologic mediastinal adenopathy.  CARDIAC TTE (07/07/15): Narrow LVOT with normal cavity size. Mild LVH. EF 55-60%. Grade 1 diastolic dysfunction. LA & RA normal in size. RV normal in size and function. No aortic regurgitation. No mitral stenosis or regurgitation. No pulmonic regurgitation. Mild tricuspid regurgitation. No pericardial effusion.  LABS 08/02/15 CBC: 9.2/14.4/42.7/390 BMP: 133/4.3/98/28/17/1.09/94/9.5 LFT: 4.2/7.0/0.5/62/18/16    Assessment & Plan:  82 y.o. female with ongoing cough and dyspnea. I feel patient's atypical chest pain is likely musculoskeletal in etiology. The etiology for her cough is unclear at this time and could be related to an atypical infection. Her last CT scan was approximately 5 months ago and I believe repeating it prior to consideration of a bronchoscopy with lavage is reasonable to help direct further cultures and possibly biopsies. I instructed the patient contact my office if she had any new breathing problems before next appointment. We also briefly discussed the risks of the procedure including bleeding, infection, pneumothorax, medication allergy, vocal cord injury, and potentially  death.  1. Dyspnea w/ Cough:  Unclear etiology. Checking CT chest without contrast. Patient may require bronchoscopy with lavage pending upon this result. 2. Atypical Chest Pain:  Unlikely musculoskeletal. Checking CT chest without contrast. 3. GERD/Dyspepsia: Unlikely. Discontinuing all medications. 4. Follow-up: Patient to return to clinic in  8 weeks or sooner if needed.  Donna Christen Jamison Neighbor, M.D. [facility] Pager:  [phone] After 3pm or if no response, call [phone] 11:00 AM 03/27/16

## 2016-03-27 NOTE — Patient Instructions (Signed)
   We will contact you with your CT result.   Call me if you have any new breathing problems or questions.  I will see you back in 8 weeks or possibly sooner depending on procedures and CT result.  TESTS ORDERED: 1. CT Chest w/o

## 2016-03-27 NOTE — Addendum Note (Signed)
Addended by: Velvet BatheAULFIELD, Boleslaw Borghi L on: 03/27/2016 11:26 AM   Modules accepted: Orders

## 2016-03-31 NOTE — Progress Notes (Signed)
Cardiology Office Note    Date:  04/01/2016   ID:  [name], [name] [birth year], MRN [id number]  PCP:  Oliver Barre, MD  Cardiologist:   Donato Schultz, MD , EP: Dr. Johney Frame    History of Present Illness:  Alexandra Reyes is a 82 y.o. female former patient of Dr. Yevonne Pax here for follow-up of paroxysmal supraventricular tachycardia. She is retired Engineer, civil (consulting). She has been a widow for over 20 years.Husband Lollie Sails died in 62. Dictation #1 [id number]  [id number]  Nuclear stress test in 2010 and 06/02/14 was normal. No prior cardiac catheterization.  Echocardiogram from 06/16/13 showed diastolic dysfunction, EF 65%.  Dr. Johney Frame has been seeing her for paroxysmal supraventricular tachycardia which has been long-standing. He diagnosed her with atrial tachycardia. Placed her on amiodarone. He felt we would do better overall avoiding ablation given advanced age. Monitoring thyroid and liver.  Chest wall pain has been stable. Exertional dyspnea has been followed by pulmonary.She also has GERD-like symptoms after eating. She has had this for years. Her shortness of breath has been fairly stable. Trying Symbicort. Decreased dose because of shakiness with increased dose.  01/24/16 - Dr. Marina Goodell - sensitive right low rib pain, costochondritis. Been present for months. He did not think she had GERD. Tried Prilosec.   Dr. Jamison Neighbor - Mucous thick, cough.   She grew up in [place], she worked at [employer], school of nursing.  Past Medical History:  Diagnosis Date  . Alcohol dependence in remission (HCC) 05/22/2011  . Allergic rhinitis, cause unspecified 05/22/2011  . Allergy   . Anemia, iron deficiency 05/18/2011  . Cholelithiasis   . COPD (chronic obstructive pulmonary disease) (HCC)   . DDD (degenerative disc disease), lumbar 05/18/2011  . First degree AV block   . GERD (gastroesophageal reflux disease)   . Hiatal hernia   . HTN (hypertension) 05/18/2011  . Hyperlipidemia  05/18/2011  . Macular degeneration   . Myocardial infarction   . OA (osteoarthritis)   . Osteoporosis 05/18/2011  . PAT (paroxysmal atrial tachycardia) (HCC)   . Pectus excavatum 05/18/2011  . Personal history of colonic polyps 10/28/2012  . SVT (supraventricular tachycardia) (HCC)     Past Surgical History:  Procedure Laterality Date  . ABDOMINAL HYSTERECTOMY  1988  . BREAST BIOPSY  1948  . BREAST LUMPECTOMY    . CARDIOVASCULAR STRESS TEST  03/08/2009   EF 84%  . TONSILLECTOMY AND ADENOIDECTOMY    . US ECHOCARDIOGRAPHY  01/17/2003   EF 55-60%    Current Medications: Outpatient Medications Prior to Visit  Medication Sig Dispense Refill  . acetaminophen (TYLENOL) 325 MG tablet Take 650 mg by mouth every 6 (six) hours as needed (joint pain).    Marland Kitchen ALPRAZolam (XANAX) 0.25 MG tablet Take 1 tablet (0.25 mg total) by mouth at bedtime as needed for anxiety. 30 tablet 5  . amiodarone (PACERONE) 200 MG tablet Take 0.5 tablets (100 mg total) by mouth daily.    Marland Kitchen amLODipine (NORVASC) 2.5 MG tablet TAKE 1 TABLET BY MOUTH EVERY DAY 90 tablet 3  . Ascorbic Acid (VITAMIN C PO) Take 1 tablet by mouth daily. Reported on 08/08/2015    . aspirin 81 MG tablet Take 81 mg by mouth daily.      . B Complex Vitamins (VITAMIN B COMPLEX PO) Take 1 tablet by mouth daily.     . budesonide-formoterol (SYMBICORT) 80-4.5 MCG/ACT inhaler Inhale 1 puff into the lungs 2 (two) times daily. 1 Inhaler 3  .  calcium carbonate (OS-CAL) 600 MG tablet Take 600 mg by mouth daily.    Marland Kitchen. CARTIA XT 120 MG 24 hr capsule TAKE 1 CAPSULE BY MOUTH DAILY 90 capsule 1  . diltiazem (CARDIZEM) 30 MG tablet Take 1-2 tablets by mouth every 6 hours as needed for fast heart rates 30 tablet 1  . docusate sodium (COLACE) 100 MG capsule Take 100 mg by mouth as needed for mild constipation.    . ferrous sulfate 325 (65 FE) MG tablet Take 325 mg by mouth daily.     . furosemide (LASIX) 40 MG tablet Take 1 tablet (40 mg total) by mouth daily. 90 tablet  3  . Glucosamine HCl-MSM (GLUCOSAMINE-MSM PO) Take 2 tablets by mouth daily.     . nitroGLYCERIN (NITROSTAT) 0.4 MG SL tablet Place 1 tablet (0.4 mg total) under the tongue every 5 (five) minutes as needed for chest pain. 25 tablet 3  . omeprazole (PRILOSEC) 40 MG capsule Take 1 capsule (40 mg total) by mouth daily. 30 capsule 6  . simvastatin (ZOCOR) 10 MG tablet Take 1 tablet (10 mg total) by mouth daily at 6 PM. 90 tablet 3  . triamcinolone cream (KENALOG) 0.1 % Apply 1 application topically 2 (two) times daily. Apply to affected area    . vitamin E 200 UNIT capsule Take 200 Units by mouth daily.     No facility-administered medications prior to visit.      Allergies:   Lisinopril; Pneumovax 23 [pneumococcal vac polyvalent]; and Symbicort [budesonide-formoterol fumarate]   Social History   Social History  . Marital status: Widowed    Spouse name: N/A  . Number of children: Y  . Years of education: 4411   Occupational History  . Retired Designer, jewelleryegistered Nurse    Social History Main Topics  . Smoking status: Former Smoker    Packs/day: 1.50    Years: 36.00    Types: Cigarettes    Start date: 03/01/1959    Quit date: 06/10/1994  . Smokeless tobacco: Never Used     Comment: smoked 1- 1.5 ppd.   . Alcohol use No  . Drug use: No  . Sexual activity: Not Asked   Other Topics Concern  . None   Social History Narrative   [place] She moved to [place] in 1950 during the polio epidemic. Previously worked as a Engineer, civil (consulting)nurse. No pets currently. Remote bird exposure. No mold or hot tub exposure.      Family History:  The patient's family history includes Arthritis in her mother; Heart disease in her father; Heart failure in her mother; Hypertension in her father; Kidney failure in her father.   ROS:   Please see the history of present illness.    ROS All other systems reviewed and are negative.   PHYSICAL EXAM:   VS:  BP 118/64   Pulse 94   Ht 5\' 3"  (1.6 m)   Wt 146 lb 12.8  oz (66.6 kg)   LMP  (LMP Unknown)   BMI 26.00 kg/m    GEN: Well nourished, well developed, in no acute distress  HEENT: normal  Neck: no JVD, carotid bruits, or masses Cardiac: RRR; no murmurs, rubs, or gallops,no edema  Respiratory:  clear to auscultation bilaterally, normal work of breathing GI: soft, nontender, nondistended, + BS MS: no deformity or atrophy  Skin: warm and dry, no rash Neuro:  Alert and Oriented x 3, Strength and sensation are intact Psych: euthymic mood, full affect  Wt  Readings from Last 3 Encounters:  04/01/16 146 lb 12.8 oz (66.6 kg)  03/27/16 146 lb (66.2 kg)  03/13/16 147 lb 6 oz (66.8 kg)      Studies/Labs Reviewed:   EKG: none today Recent Labs: 02/14/2016: ALT 22; BUN 16; Creat 1.11; Hemoglobin 14.0; Platelets 350; Potassium 4.4; Sodium 135; TSH 1.42   Lipid Panel    Component Value Date/Time   CHOL 167 05/24/2015 0831   TRIG 71 05/24/2015 0831   HDL 84 05/24/2015 0831   CHOLHDL 2.0 05/24/2015 0831   VLDL 14 05/24/2015 0831   LDLCALC 69 05/24/2015 0831   LDLDIRECT 129.2 05/15/2011 0925    Additional studies/ records that were reviewed today include:  Prior office visits, echocardiogram, lab work reviewed  NUC stress: 07/12/15:  Nuclear stress EF: 80%.  Defect 1: There is a small defect of moderate severity present in the basal inferoseptal location.  Findings consistent with prior myocardial infarction.  This is a low risk study.   ASSESSMENT:    1. SVT (supraventricular tachycardia) (HCC)   2. Other chest pain   3. SOB (shortness of breath)      PLAN:  In order of problems listed above:  Paroxysmal supraventricular tachycardia  - Paroxysmal atrial tachycardia responding well to amiodarone, Dr. Johney Frame. Now taking 100 mg. SOB was prior to amiodarone. She was worried about potential side effects of amiodarone.  - Long RP tachycardia. He states that he cannot exclude a reentrant arrhythmia.  - First degree AV block noted. No  syncope.  Diastolic dysfunction  - Stable, normal ejection fraction  Mild coronary artery calcification  - Seen on CT scan 10/09/15. Continue with prevention efforts.  Dyspnea/chest discomfort  - Recent nuclear stress test reassuring, EF reassuring. Right-sided below rib discomfort, sometimes worse with palpation. Likely musculoskeletal.  - Pulmonary following  - Cardiac catheterization was contemplated previously however she was reluctant to pursue. I agree with conservative management at this point. Recent reassuring nuclear stress test. She saw gastroenterology, Dr. Marina Goodell, reassurance.  Chronic venous insufficiency/edema  - Support hose, agree  Essential hypertension  - Medications reviewed, stable    Medication Adjustments/Labs and Tests Ordered: Current medicines are reviewed at length with the patient today.  Concerns regarding medicines are outlined above.  Medication changes, Labs and Tests ordered today are listed in the Patient Instructions below. Patient Instructions  Medication Instructions:  The current medical regimen is effective;  continue present plan and medications.  Follow-Up: Follow up in 6 months with Dr. Anne Fu.  You will receive a letter in the mail 2 months before you are due.  Please call us when you receive this letter to schedule your follow up appointment.   If you need a refill on your cardiac medications before your next appointment, please call your pharmacy.  Thank you for choosing [facility]!!        Signed, Donato Schultz, MD  04/01/2016 12:06 PM    [facility] [address] [place], [place], [place]  [address] Phone: [phone]; Fax: [phone]

## 2016-04-01 ENCOUNTER — Encounter: Payer: Self-pay | Admitting: Cardiology

## 2016-04-01 ENCOUNTER — Ambulatory Visit (INDEPENDENT_AMBULATORY_CARE_PROVIDER_SITE_OTHER): Payer: Medicare Other | Admitting: Cardiology

## 2016-04-01 VITALS — BP 118/64 | HR 94 | Ht 63.0 in | Wt 146.8 lb

## 2016-04-01 DIAGNOSIS — R0789 Other chest pain: Secondary | ICD-10-CM

## 2016-04-01 DIAGNOSIS — I471 Supraventricular tachycardia: Secondary | ICD-10-CM | POA: Diagnosis not present

## 2016-04-01 DIAGNOSIS — R0602 Shortness of breath: Secondary | ICD-10-CM | POA: Diagnosis not present

## 2016-04-01 NOTE — Patient Instructions (Signed)

## 2016-04-03 ENCOUNTER — Ambulatory Visit (INDEPENDENT_AMBULATORY_CARE_PROVIDER_SITE_OTHER)
Admission: RE | Admit: 2016-04-03 | Discharge: 2016-04-03 | Disposition: A | Discharge status: Home / Self Care | Payer: Medicare Other | Source: Ambulatory Visit | Attending: Pulmonary Disease | Admitting: Pulmonary Disease

## 2016-04-03 DIAGNOSIS — R05 Cough: Secondary | ICD-10-CM | POA: Diagnosis not present

## 2016-04-03 DIAGNOSIS — J9811 Atelectasis: Secondary | ICD-10-CM | POA: Diagnosis not present

## 2016-04-03 DIAGNOSIS — R0789 Other chest pain: Secondary | ICD-10-CM

## 2016-04-03 DIAGNOSIS — R059 Cough, unspecified: Secondary | ICD-10-CM

## 2016-04-04 ENCOUNTER — Encounter: Payer: Self-pay | Admitting: Surgery

## 2016-04-08 ENCOUNTER — Encounter: Payer: Self-pay | Admitting: Surgery

## 2016-04-08 ENCOUNTER — Ambulatory Visit (INDEPENDENT_AMBULATORY_CARE_PROVIDER_SITE_OTHER): Payer: Medicare Other | Admitting: Surgery

## 2016-04-08 ENCOUNTER — Ambulatory Visit (HOSPITAL_COMMUNITY)
Admission: RE | Admit: 2016-04-08 | Discharge: 2016-04-08 | Disposition: A | Discharge status: Home / Self Care | Payer: Medicare Other | Source: Ambulatory Visit | Attending: Surgery | Admitting: Surgery

## 2016-04-08 VITALS — BP 139/81 | HR 66 | Temp 97.0°F | Resp 24 | Ht 63.0 in | Wt 146.5 lb

## 2016-04-08 DIAGNOSIS — E785 Hyperlipidemia, unspecified: Secondary | ICD-10-CM | POA: Diagnosis not present

## 2016-04-08 DIAGNOSIS — I70213 Atherosclerosis of native arteries of extremities with intermittent claudication, bilateral legs: Secondary | ICD-10-CM | POA: Insufficient documentation

## 2016-04-08 DIAGNOSIS — R0989 Other specified symptoms and signs involving the circulatory and respiratory systems: Secondary | ICD-10-CM | POA: Diagnosis present

## 2016-04-08 DIAGNOSIS — I10 Essential (primary) hypertension: Secondary | ICD-10-CM | POA: Insufficient documentation

## 2016-04-08 NOTE — Progress Notes (Signed)
Vitals:   04/08/16 1235 04/08/16 1240  BP: (!) 155/82 139/81  Pulse: 66   Resp: (!) 24   Temp: 97 F (36.1 C)   TempSrc: Oral   SpO2: 100%   Weight: 146 lb 8 oz (66.5 kg)   Height: 5\' 3"  (1.6 m)

## 2016-04-08 NOTE — Progress Notes (Signed)
[facility] [place]  Patient name: Alexandra Reyes MRN: [id number] DOB: [birth year] Sex: female  REASON FOR VISIT: follow up  HPI: Alexandra Reyes is a 82 y.o. female who returns today for follow-up.  I saw her 1 year ago for evaluation of peripheral vascular disease.  At that time, she was having an aching pain in her legs with walking just a short distance.  Her walking was also limited by her shortness of breath.  She has a chronic history of tachycardia which has recently improved with the addition of amiodarone.  She denies leg ulcers or rest pain.  She continues to take a statin.  She is a former smoker.  She continues to take multiple medications for her hypertension.  The patient states that she has been wearing her compression stocking, and as a result, her Lasix dose has been decreased.  Her swelling she states is better.  Past Medical History:  Diagnosis Date  . Alcohol dependence in remission (HCC) 05/22/2011  . Allergic rhinitis, cause unspecified 05/22/2011  . Allergy   . Anemia, iron deficiency 05/18/2011  . Cholelithiasis   . COPD (chronic obstructive pulmonary disease) (HCC)   . DDD (degenerative disc disease), lumbar 05/18/2011  . First degree AV block   . GERD (gastroesophageal reflux disease)   . Hiatal hernia   . HTN (hypertension) 05/18/2011  . Hyperlipidemia 05/18/2011  . Macular degeneration   . Myocardial infarction   . OA (osteoarthritis)   . Osteoporosis 05/18/2011  . PAT (paroxysmal atrial tachycardia) (HCC)   . Pectus excavatum 05/18/2011  . Personal history of colonic polyps 10/28/2012  . SVT (supraventricular tachycardia) (HCC)     Family History  Problem Relation Age of Onset  . Heart failure Mother   . Arthritis Mother   . Kidney failure Father   . Heart disease Father   . Hypertension Father     SOCIAL HISTORY: Social History  Substance Use Topics  . Smoking status: Former Smoker   Packs/day: 1.50    Years: 36.00    Types: Cigarettes    Start date: 03/01/1959    Quit date: 06/10/1994  . Smokeless tobacco: Never Used     Comment: smoked 1- 1.5 ppd.   . Alcohol use No    Allergies  Allergen Reactions  . Lisinopril     Unknown Pt states she's never taken Lisinopril  . Pneumovax 23 [Pneumococcal Vac Polyvalent] Other (See Comments)    Flu like symtpoms for 1 day  . Symbicort [Budesonide-Formoterol Fumarate]     Jittery, SOB, cough    Current Outpatient Prescriptions  Medication Sig Dispense Refill  . acetaminophen (TYLENOL) 325 MG tablet Take 650 mg by mouth every 6 (six) hours as needed (joint pain).    Marland Kitchen ALPRAZolam (XANAX) 0.25 MG tablet Take 1 tablet (0.25 mg total) by mouth at bedtime as needed for anxiety. 30 tablet 5  . amiodarone (PACERONE) 200 MG tablet Take 0.5 tablets (100 mg total) by mouth daily.    Marland Kitchen amLODipine (NORVASC) 2.5 MG tablet TAKE 1 TABLET BY MOUTH EVERY DAY 90 tablet 3  . Ascorbic Acid (VITAMIN C PO) Take 1 tablet by mouth daily. Reported on 08/08/2015    . aspirin 81 MG tablet Take 81 mg by mouth daily.      . B Complex Vitamins (VITAMIN B COMPLEX PO) Take 1 tablet by mouth daily.     . budesonide-formoterol (SYMBICORT) 80-4.5 MCG/ACT inhaler Inhale 1 puff into the lungs  2 (two) times daily. 1 Inhaler 3  . calcium carbonate (OS-CAL) 600 MG tablet Take 600 mg by mouth daily.    Marland Kitchen. CARTIA XT 120 MG 24 hr capsule TAKE 1 CAPSULE BY MOUTH DAILY 90 capsule 1  . diltiazem (CARDIZEM) 30 MG tablet Take 1-2 tablets by mouth every 6 hours as needed for fast heart rates 30 tablet 1  . docusate sodium (COLACE) 100 MG capsule Take 100 mg by mouth as needed for mild constipation.    . ferrous sulfate 325 (65 FE) MG tablet Take 325 mg by mouth daily.     . furosemide (LASIX) 40 MG tablet Take 1 tablet (40 mg total) by mouth daily. 90 tablet 3  . Glucosamine HCl-MSM (GLUCOSAMINE-MSM PO) Take 2 tablets by mouth daily.     . nitroGLYCERIN (NITROSTAT) 0.4 MG SL  tablet Place 1 tablet (0.4 mg total) under the tongue every 5 (five) minutes as needed for chest pain. 25 tablet 3  . omeprazole (PRILOSEC) 40 MG capsule Take 1 capsule (40 mg total) by mouth daily. 30 capsule 6  . simvastatin (ZOCOR) 10 MG tablet Take 1 tablet (10 mg total) by mouth daily at 6 PM. 90 tablet 3  . triamcinolone cream (KENALOG) 0.1 % Apply 1 application topically 2 (two) times daily. Apply to affected area    . vitamin E 200 UNIT capsule Take 200 Units by mouth daily.     No current facility-administered medications for this visit.     REVIEW OF SYSTEMS:  [X]  denotes positive finding, [ ]  denotes negative finding Cardiac  Comments:  Chest pain or chest pressure: x   Shortness of breath upon exertion: x   Short of breath when lying flat:    Irregular heart rhythm:        Vascular    Pain in calf, thigh, or hip brought on by ambulation:    Pain in feet at night that wakes you up from your sleep:     Blood clot in your veins:    Leg swelling:         Pulmonary    Oxygen at home:    Productive cough:  x   Wheezing:         Neurologic    Sudden weakness in arms or legs:     Sudden numbness in arms or legs:     Sudden onset of difficulty speaking or slurred speech:    Temporary loss of vision in one eye:     Problems with dizziness:         Gastrointestinal    Blood in stool:     Vomited blood:         Genitourinary    Burning when urinating:     Blood in urine:        Psychiatric    Major depression:         Hematologic    Bleeding problems:    Problems with blood clotting too easily:        Skin    Rashes or ulcers:        Constitutional    Fever or chills:      PHYSICAL EXAM: Vitals:   04/08/16 1235 04/08/16 1240  BP: (!) 155/82 139/81  Pulse: 66   Resp: (!) 24   Temp: 97 F (36.1 C)   TempSrc: Oral   SpO2: 100%   Weight: 146 lb 8 oz (66.5 kg)   Height: 5\' 3"  (1.6 m)  GENERAL: The patient is a well-nourished female, in no acute  distress. The vital signs are documented above. CARDIAC: There is a regular rate and rhythm.  VASCULAR: Bilateral 1+ pitting edema.  I cannot palpate pedal pulses PULMONARY: There is good air exchange bilaterally without wheezing or rales. MUSCULOSKELETAL: There are no major deformities or cyanosis. NEUROLOGIC: No focal weakness or paresthesias are detected. SKIN: There are no ulcers or rashes noted. PSYCHIATRIC: The patient has a normal affect.  DATA:  Ultrasound was ordered and reviewed today.  ABIs 1.05 on the right and 1.0 for the left.  Both have triphasic waveforms  MEDICAL ISSUES: I discussed with the patient that I feel her blood flow is normal.  I do not think vascular insufficiency is responsible for any of her symptoms in her legs.  I have encouraged her to continue wearing her compression stockings.  She will contact me on an as-needed basis in the future if she has further concerns about her legs.    Durene CalWells Brayleigh Rybacki, MD [facility] Tel [phone] Pager [phone]

## 2016-04-16 ENCOUNTER — Telehealth: Payer: Self-pay | Admitting: Pulmonary Disease

## 2016-04-16 DIAGNOSIS — J449 Chronic obstructive pulmonary disease, unspecified: Secondary | ICD-10-CM

## 2016-04-16 DIAGNOSIS — R0609 Other forms of dyspnea: Secondary | ICD-10-CM

## 2016-04-16 DIAGNOSIS — J479 Bronchiectasis, uncomplicated: Secondary | ICD-10-CM

## 2016-04-16 NOTE — Telephone Encounter (Signed)
CT Chest Wo Contrast (Accession [id number]) (Order [id number])  Imaging  Date: 04/03/2016 Department: [facility] Released By: Ardelle Anton Authorizing: Javier Glazier, MD  Exam Information   Status Exam Begun  Exam Ended   Final [99] 04/03/2016 1:02 PM 04/03/2016 1:17 PM  PACS Images   Show images for CT Chest Wo Contrast  Study Result   CLINICAL DATA:  Persistent cough, right side chest pain for a proximately 5 months  EXAM: CT CHEST WITHOUT CONTRAST  TECHNIQUE: Multidetector CT imaging of the chest was performed following the standard protocol without IV contrast.  COMPARISON:  10/09/2015  FINDINGS: Cardiovascular: Cardiac size is stable. Atherosclerotic calcifications of thoracic aorta and coronary arteries. No pericardial effusion.  Mediastinum/Nodes: No mediastinal hematoma or adenopathy. A precarinal lymph node Measures 8.5 mm short-axis not pathologic. There is small hiatal hernia. Mild thickening of distal esophageal wall. Gastroesophageal reflux cannot be excluded.  Lungs/Pleura: Images of the lung parenchyma shows no infiltrate or pulmonary edema. No focal consolidation. Minimal scarring and atelectasis bilateral lung bases is stable. Stable scarring left base anterolaterally. Stable scarring right base anteriorly. No bronchiectasis. No bronchitic changes. No emphysema.  Upper Abdomen: Visualized upper abdomen shows no adrenal gland mass. There is a cyst in midpole of the right kidney measures 1.9 cm. Atherosclerotic calcifications of abdominal aorta. The visualized spleen is unremarkable.  Musculoskeletal: No destructive bony lesions are noted. Sagittal images of the spine shows osteopenia and mild degenerative changes thoracic spine. Sagittal view of the sternum is unremarkable.  IMPRESSION: 1. No infiltrate or pulmonary edema. Stable atelectasis and scarring bilateral lung bases. 2. No mediastinal  hematoma or adenopathy. 3. Atherosclerotic calcifications thoracic aorta and coronary arteries. 4. Degenerative changes thoracic spine.   Electronically Signed   By: Lahoma Crocker M.D.   On: 04/03/2016 13:47   Result Notes   Notes Recorded by Len Blalock, CMA on 04/16/2016 at 12:31 PM EST lmtcb X1 for pt. Will order labs after speaking to pt. ------  Notes Recorded by Javier Glazier, MD on 04/12/2016 at 2:06 PM EDT Please let the patient know I reviewed her chest CT. There is more of a suggestion of scar tissue/fibrosis in the very bottoms of her lungs compared with her prior imaging. This appears quite mild but I do wonder if it might be where her cough is coming from. Please order an ANA with reflex, ESR, CRP, Rheumatoid Factor, Anti-CCP, SCL-70, and Hypersensitivity Pneumonitis Panel. I'm going to hold off on bronchoscopy pending these results. Thanks.      Vitals   Height Weight BMI (Calculated)  _0  (1.6 m) 146 lb 8 oz (66.5 kg) 26  External Result Report   External Result Report  Imaging   Imaging Information  Signed by   Signed Date/Time  Phone Pager  POP, LIVIU 04/03/2016 1:47 PM [phone]  Result Notes   Notes Recorded by Len Blalock, CMA on 04/16/2016 at 12:31 PM EST lmtcb X1 for pt. Will order labs after speaking to pt. ------  Notes Recorded by Javier Glazier, MD on 04/12/2016 at 2:06 PM EDT Please let the patient know I reviewed her chest CT. There is more of a suggestion of scar tissue/fibrosis in the very bottoms of her lungs compared with her prior imaging. This appears quite mild but I do wonder if it might be where her cough is coming from. Please order an ANA with reflex, ESR, CRP, Rheumatoid Factor, Anti-CCP, SCL-70, and Hypersensitivity  Pneumonitis Panel. I'm going to hold off on bronchoscopy pending these results. Thanks.      Signed   Electronically signed by Lahoma Crocker, MD on 04/03/16 at 1347 EDT  Original  Order   Ordered On Ordered By   03/27/2016 11:26 AM Len Blalock, CMA        Called and spoke with pt and she is aware of results and  labs in the computer and she will come in for these. Orders have been placed and nothing further is needed.

## 2016-04-17 ENCOUNTER — Other Ambulatory Visit (INDEPENDENT_AMBULATORY_CARE_PROVIDER_SITE_OTHER): Payer: Medicare Other

## 2016-04-17 DIAGNOSIS — J449 Chronic obstructive pulmonary disease, unspecified: Secondary | ICD-10-CM

## 2016-04-17 DIAGNOSIS — R0609 Other forms of dyspnea: Secondary | ICD-10-CM | POA: Diagnosis not present

## 2016-04-17 DIAGNOSIS — J479 Bronchiectasis, uncomplicated: Secondary | ICD-10-CM

## 2016-04-17 DIAGNOSIS — F4329 Adjustment disorder with other symptoms: Secondary | ICD-10-CM | POA: Diagnosis not present

## 2016-04-17 LAB — C-REACTIVE PROTEIN: CRP: 0.2 mg/dL — AB (ref 0.5–20.0)

## 2016-04-17 LAB — SEDIMENTATION RATE: Sed Rate: 19 mm/hr (ref 0–30)

## 2016-04-18 LAB — CYCLIC CITRUL PEPTIDE ANTIBODY, IGG: Cyclic Citrullin Peptide Ab: <16 Units

## 2016-04-18 LAB — ANTI-SCLERODERMA ANTIBODY: Scleroderma (Scl-70) (ENA) Antibody, IgG: <1

## 2016-04-18 LAB — RHEUMATOID FACTOR

## 2016-04-18 LAB — ANA W/REFLEX: ANA: NEGATIVE

## 2016-04-23 ENCOUNTER — Telehealth: Payer: Self-pay | Admitting: Pulmonary Disease

## 2016-04-23 LAB — HYPERSENSITIVITY PNUEMONITIS PROFILE

## 2016-04-23 MED ORDER — PREDNISONE 20 MG PO TABS: 40.0000 mg | ORAL_TABLET | Freq: Every day | ORAL | 0 refills | Status: DC

## 2016-04-23 NOTE — Telephone Encounter (Signed)
Called and spoke to pt. Informed her of the recs per JN. Rx sent to preferred pharmacy. Pt verbalized understanding and denied any further questions or concerns at this time.   

## 2016-04-23 NOTE — Telephone Encounter (Signed)
Please let the patient know that her labs tests thus far are negative and normal but some are still pending. I would like to try her on a short course of Prednisone 40mg  daily x4 days to see if this helps. I would hold off on antibiotics for right now unless she starts to have any fever, chills, sweats, or begins to produce a mucus that is discolored. Thanks.

## 2016-04-23 NOTE — Telephone Encounter (Signed)
Called and spoke to pt. Pt c/o prod cough with white mucus, increase in SOB with little activity, nausea, and chest tightness when SOB x 2 weeks. Pt denies f/c/s. Pt states she has been taken Robitussin without relief. Pt is requesting recs and results of lab work that was collected on 04/17/16.   Dr. [name] please advise. Thanks.

## 2016-04-28 IMAGING — RF DG ESOPHAGUS
10 series · 12 of 14 positions shown · non-contrast
Comparison: CT PE 08/03/2015

CLINICAL DATA: Patient with cough, mostly in the morning.
Heartburn.

EXAM:
ESOPHOGRAM / BARIUM SWALLOW / BARIUM TABLET STUDY
TECHNIQUE: Combined double contrast and single contrast examination performed
using effervescent crystals, thick barium liquid, and thin barium
liquid. The patient was observed with fluoroscopy swallowing a 13 mm
barium sulphate tablet.
FLUOROSCOPY TIME:  Radiation Exposure Index (as provided by the
fluoroscopic device): 14.7 mGy
If the device does not provide the exposure index:
Fluoroscopy Time:  2 minutes 36 seconds

[Series 1: cp_standard · 0.34mm/px · 3 of 28 frames shown (1 of 10)]
[frame 1/28]
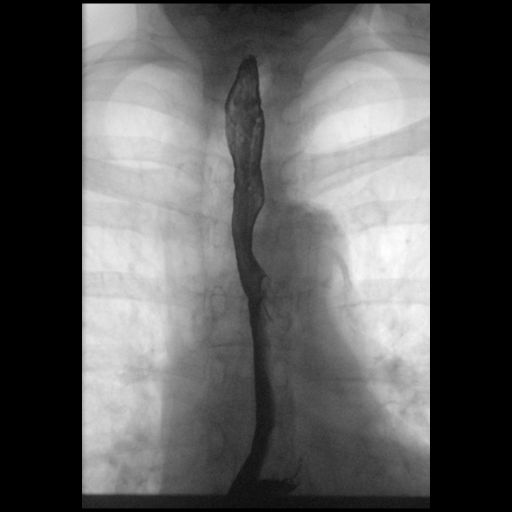
[frame 5/28]
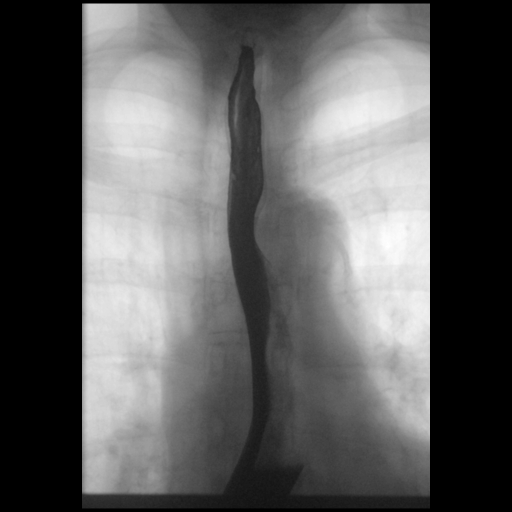
[frame 15/28]
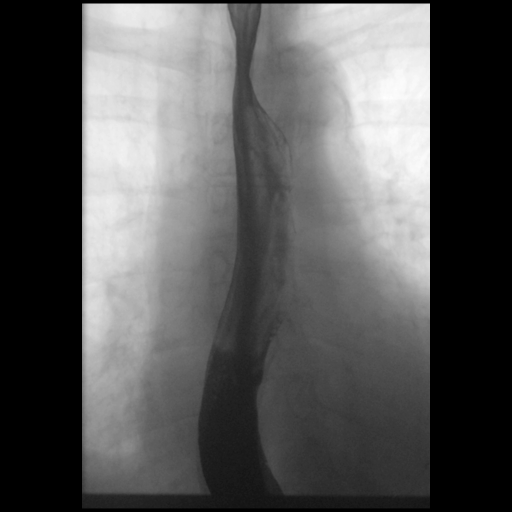

[Series 2: cp_standard · 0.17mm/px · 1 of 1 slices shown (2 of 10)]
[im 1/1]
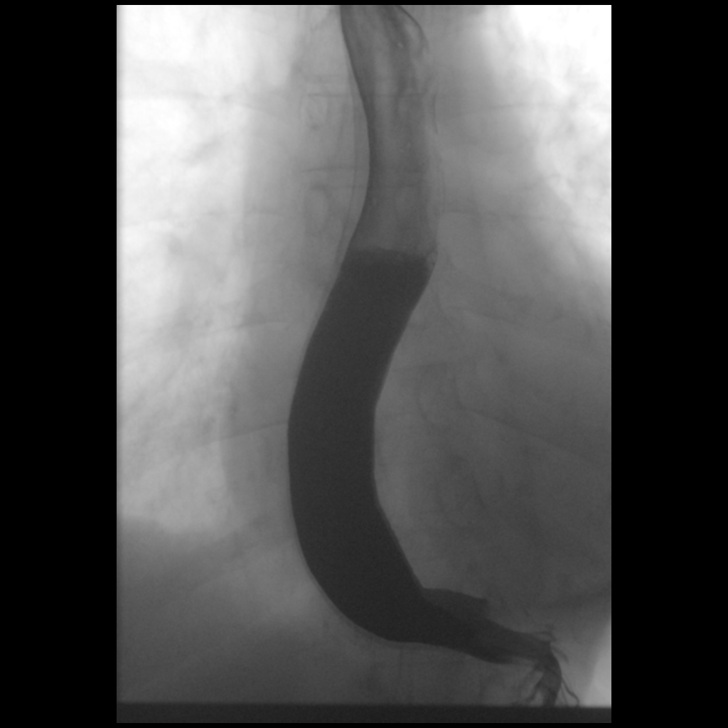

[Series 3: cp_standard · 0.17mm/px · 1 of 1 slices shown (3 of 10)]
[im 1/1]
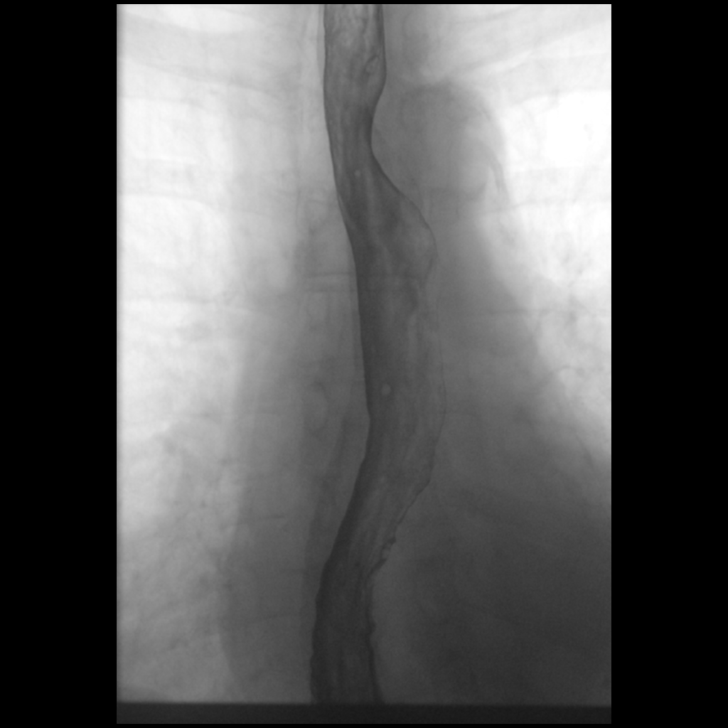

[Series 4: cp_standard · 0.17mm/px · 1 of 1 slices shown (4 of 10)]
[im 1/1]
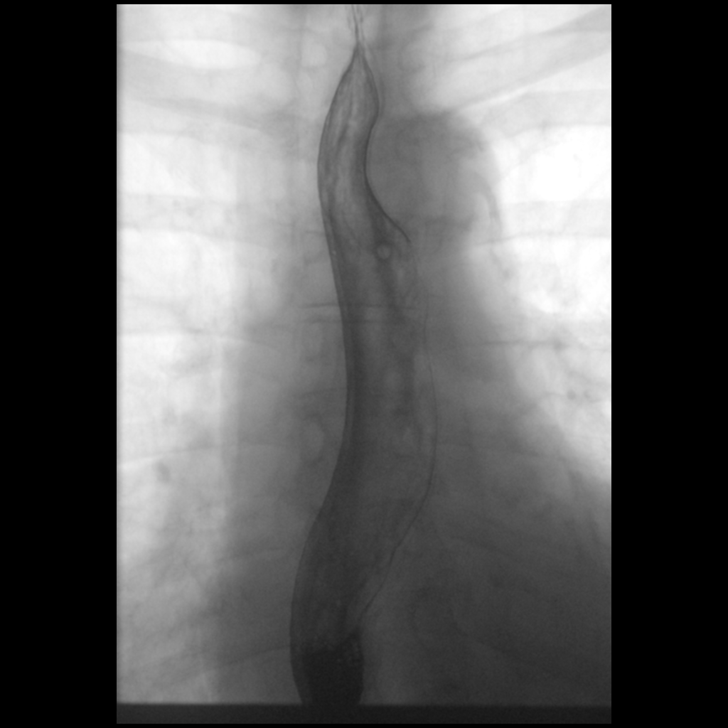

[Series 5: cp_standard · 0.17mm/px · 1 of 1 slices shown (5 of 10)]
[im 1/1]
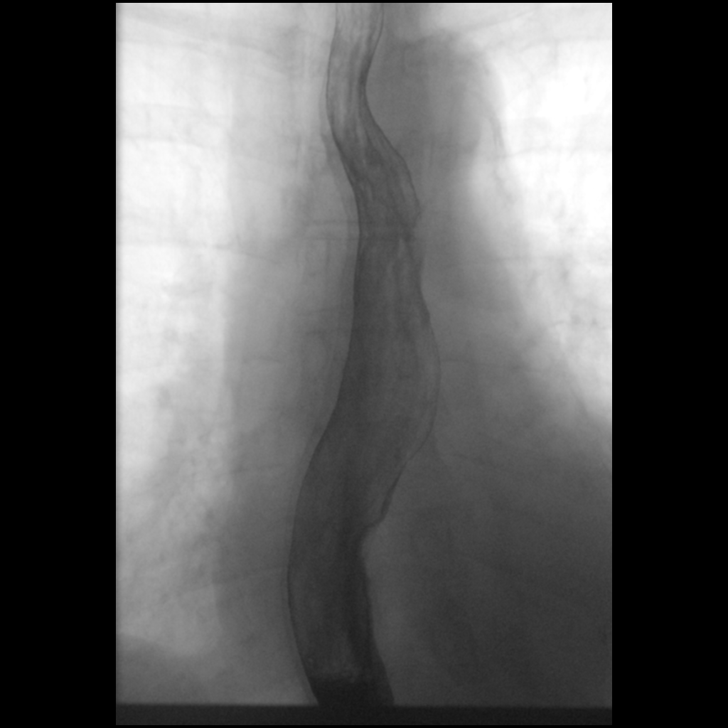

[Series 6: cp_standard · 0.17mm/px · 1 of 1 slices shown (6 of 10)]
[im 1/1]
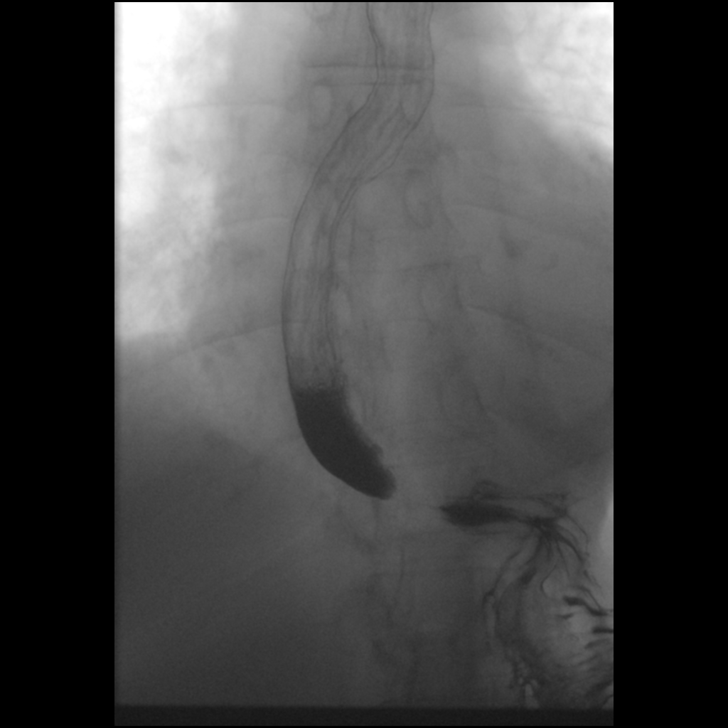

[Series 7: cp_standard · 0.18mm/px · 1 of 1 slices shown (7 of 10)]
[im 1/1]
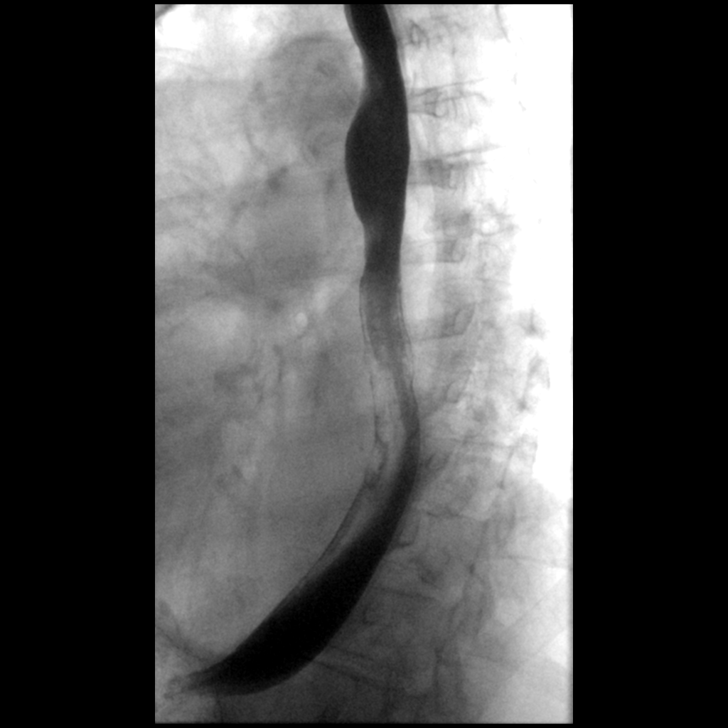

[Series 8: cp_standard · 0.18mm/px · 1 of 2 slices shown (8 of 10)]
[im 2/2]
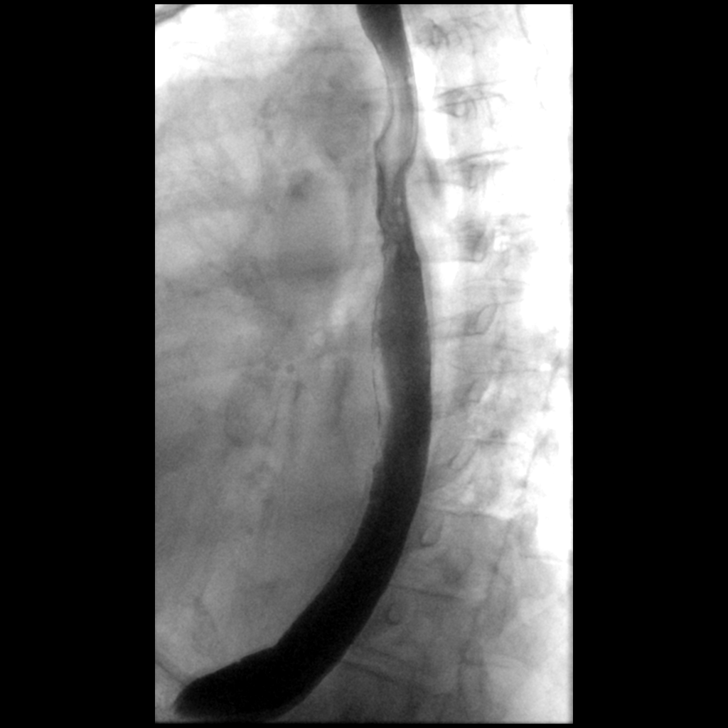

[Series 10: cp_standard · 0.18mm/px · 1 of 1 slices shown (9 of 10)]
[im 1/1]
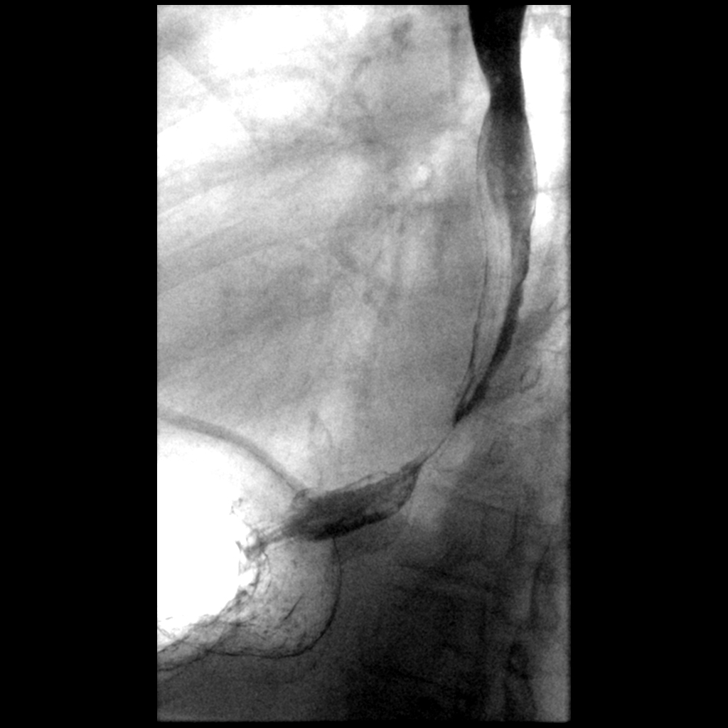

[Series 11: cp_standard · 0.19mm/px · 1 of 1 slices shown (10 of 10)]
[im 1/1]
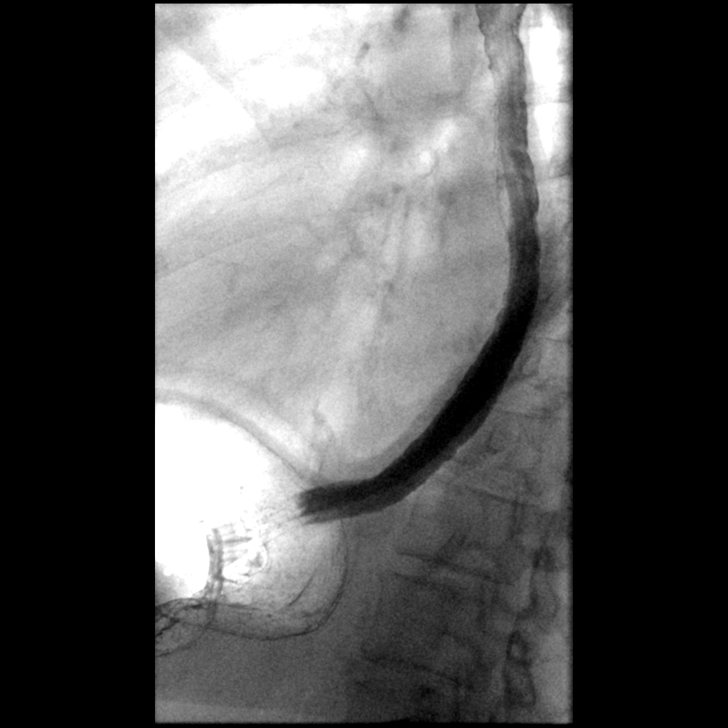

[12 of 14 positions shown; findings below may reference images not displayed]

FINDINGS: Contrast flows freely throughout the esophagus without evidence for
stricture or mass lesion. Moderate esophageal dysmotility. No hiatal
hernia. There is a small amount of spontaneous gastroesophageal
reflux to the level of the lower third of the esophagus. The barium
tablet passes into the stomach.
IMPRESSION: Moderate esophageal dysmotility.

Spontaneous gastroesophageal reflux to the lower third of the
esophagus.

## 2016-05-08 ENCOUNTER — Other Ambulatory Visit: Payer: Self-pay

## 2016-05-08 DIAGNOSIS — Z23 Encounter for immunization: Secondary | ICD-10-CM | POA: Diagnosis not present

## 2016-05-08 MED ORDER — SIMVASTATIN 10 MG PO TABS: 10.0000 mg | ORAL_TABLET | Freq: Every day | ORAL | 3 refills | Status: DC

## 2016-05-15 DIAGNOSIS — F4329 Adjustment disorder with other symptoms: Secondary | ICD-10-CM | POA: Diagnosis not present

## 2016-05-22 ENCOUNTER — Encounter: Payer: Self-pay | Admitting: Pulmonary Disease

## 2016-05-22 ENCOUNTER — Ambulatory Visit (INDEPENDENT_AMBULATORY_CARE_PROVIDER_SITE_OTHER): Payer: Medicare Other | Admitting: Pulmonary Disease

## 2016-05-22 VITALS — BP 124/80 | HR 77 | Ht 64.0 in | Wt 143.2 lb

## 2016-05-22 DIAGNOSIS — J849 Interstitial pulmonary disease, unspecified: Secondary | ICD-10-CM | POA: Diagnosis not present

## 2016-05-22 DIAGNOSIS — I70213 Atherosclerosis of native arteries of extremities with intermittent claudication, bilateral legs: Secondary | ICD-10-CM | POA: Diagnosis not present

## 2016-05-22 DIAGNOSIS — R091 Pleurisy: Secondary | ICD-10-CM

## 2016-05-22 MED ORDER — PREDNISONE 10 MG PO TABS: ORAL_TABLET | ORAL | 0 refills | Status: DC

## 2016-05-22 NOTE — Progress Notes (Signed)
Subjective:    Patient ID: Alexandra Reyes, female    DOB: [birth year], 82 y.o.   MRN: [id number]  C.C.:  Follow-up for Dyspnea w/ Cough & GERD.  HPI Dyspnea w/ Cough:  Previously stopped Symbicort without significant improvement. Possibly secondary to early ILD. Her cough is persisting. Cough and chest discomfort seemed to improve previously with short course of prednisone therapy.  Atypical Chest Pain:  Likely pleurisy. Pain is continuing. Pleurisy seem to respond to previous prednisone.  GERD: Seen previously by GI. Previously on Prilosec, switched from Zantac. Medications stopped at last appointment.  Review of Systems Denies any subjective fever or chills. No sinus congestion or drainage. No nausea or vomiting.  Allergies  Allergen Reactions  . Lisinopril     Unknown Pt states she's never taken Lisinopril  . Pneumovax 23 [Pneumococcal Vac Polyvalent] Other (See Comments)    Flu like symtpoms for 1 day  . Symbicort [Budesonide-Formoterol Fumarate]     Jittery, SOB, cough    Current Outpatient Prescriptions on File Prior to Visit  Medication Sig Dispense Refill  . acetaminophen (TYLENOL) 325 MG tablet Take 650 mg by mouth every 6 (six) hours as needed (joint pain).    Marland Kitchen ALPRAZolam (XANAX) 0.25 MG tablet Take 1 tablet (0.25 mg total) by mouth at bedtime as needed for anxiety. 30 tablet 5  . amiodarone (PACERONE) 200 MG tablet Take 0.5 tablets (100 mg total) by mouth daily.    Marland Kitchen amLODipine (NORVASC) 2.5 MG tablet TAKE 1 TABLET BY MOUTH EVERY DAY 90 tablet 3  . Ascorbic Acid (VITAMIN C PO) Take 1 tablet by mouth daily. Reported on 08/08/2015    . aspirin 81 MG tablet Take 81 mg by mouth daily.      . B Complex Vitamins (VITAMIN B COMPLEX PO) Take 1 tablet by mouth daily.     . budesonide-formoterol (SYMBICORT) 80-4.5 MCG/ACT inhaler Inhale 1 puff into the lungs 2 (two) times daily. 1 Inhaler 3  . calcium carbonate (OS-CAL) 600 MG tablet Take 600 mg by mouth daily.    Marland Kitchen CARTIA XT  120 MG 24 hr capsule TAKE 1 CAPSULE BY MOUTH DAILY 90 capsule 1  . diltiazem (CARDIZEM) 30 MG tablet Take 1-2 tablets by mouth every 6 hours as needed for fast heart rates 30 tablet 1  . docusate sodium (COLACE) 100 MG capsule Take 100 mg by mouth as needed for mild constipation.    . ferrous sulfate 325 (65 FE) MG tablet Take 325 mg by mouth daily.     . furosemide (LASIX) 40 MG tablet Take 1 tablet (40 mg total) by mouth daily. 90 tablet 3  . Glucosamine HCl-MSM (GLUCOSAMINE-MSM PO) Take 2 tablets by mouth daily.     . nitroGLYCERIN (NITROSTAT) 0.4 MG SL tablet Place 1 tablet (0.4 mg total) under the tongue every 5 (five) minutes as needed for chest pain. 25 tablet 3  . omeprazole (PRILOSEC) 40 MG capsule Take 1 capsule (40 mg total) by mouth daily. 30 capsule 6  . simvastatin (ZOCOR) 10 MG tablet Take 1 tablet (10 mg total) by mouth daily at 6 PM. 90 tablet 3  . triamcinolone cream (KENALOG) 0.1 % Apply 1 application topically 2 (two) times daily. Apply to affected area    . vitamin E 200 UNIT capsule Take 200 Units by mouth daily.     No current facility-administered medications on file prior to visit.     Past Medical History:  Diagnosis Date  . Alcohol  dependence in remission ([place]) 05/22/2011  . Allergic rhinitis, cause unspecified 05/22/2011  . Allergy   . Anemia, iron deficiency 05/18/2011  . Cholelithiasis   . COPD (chronic obstructive pulmonary disease) ([place])   . DDD (degenerative disc disease), lumbar 05/18/2011  . First degree AV block   . GERD (gastroesophageal reflux disease)   . Hiatal hernia   . HTN (hypertension) 05/18/2011  . Hyperlipidemia 05/18/2011  . Macular degeneration   . Myocardial infarction   . OA (osteoarthritis)   . Osteoporosis 05/18/2011  . PAT (paroxysmal atrial tachycardia) ([place])   . Pectus excavatum 05/18/2011  . Personal history of colonic polyps 10/28/2012  . SVT (supraventricular tachycardia) (HCC)     Past Surgical History:  Procedure Laterality  Date  . ABDOMINAL HYSTERECTOMY  1988  . BREAST BIOPSY  1948  . BREAST LUMPECTOMY    . CARDIOVASCULAR STRESS TEST  03/08/2009   EF 84%  . TONSILLECTOMY AND ADENOIDECTOMY    . US ECHOCARDIOGRAPHY  01/17/2003   EF 55-60%    Family History  Problem Relation Age of Onset  . Heart failure Mother   . Arthritis Mother   . Kidney failure Father   . Heart disease Father   . Hypertension Father     Social History   Social History  . Marital status: Widowed    Spouse name: N/A  . Number of children: Y  . Years of education: 26   Occupational History  . Retired Equities trader    Social History Main Topics  . Smoking status: Former Smoker    Packs/day: 1.50    Years: 36.00    Types: Cigarettes    Start date: 03/01/1959    Quit date: 06/10/1994  . Smokeless tobacco: Never Used     Comment: smoked 1- 1.5 ppd.   . Alcohol use No  . Drug use: No  . Sexual activity: Not on file   Other Topics Concern  . Not on file   Social History Narrative   Originally from [place], [place]. She moved to [place] in 1950 during the polio epidemic. Previously worked as a Marine scientist. No pets currently. Remote bird exposure. No mold or hot tub exposure.       Objective:   Physical Exam BP 124/80 (BP Location: Left Arm, Cuff Size: Normal)   Pulse 77   LMP  (LMP Unknown)   SpO2 98%  General:  Awake. Comfortable. Pleasant demeanor. Integument:  Warm & dry. No rash on exposed skin.  Lymphatics:  No appreciated cervical or supraclavicular lymphadenoapthy. HEENT:  No nasal turbinate swelling. Moist membranes. No oral ulcers. Cardiovascular:  Regular rate & rhythm. No edema.Normal S1 & S2.  Pulmonary: Clear with auscultation. Normal work of breathing on room air. Speaking in complete sentences. Musculoskeletal: No joint effusion appreciated. No chest wall deformity or mass appreciated.  PFT 07/12/15: FVC 2.29 L (106%) FEV1 1.57 L (99%) FEV1/FVC 0.68 FEF 25-75 0.78 L (81%) no bronchodilator response TLC 3.59 L  (73%) RV 52% ERV 342% DLCO uncorrected 52% 05/14/13: FVC 2.34 L (103%) FEV1 1.63 L (97%) FEV1/FVC 0.69 FEF 25-75 0.83 L (76%) no bronchodilator response TLC 4.51 L (92%) ERV 284% DLCO uncorrected 55%  IMAGING CT CHEST W/O 04/03/16 (personally reviewed by me):  No pleural effusion or thickening. No pathologic mediastinal adenopathy. No pericardial effusion. Basilar predominant subpleural jugular changes that are mild and more pronounced on CT imaging.  CT CHEST W/O 10/09/15 (previously reviewed by me):  No pneumothorax or effusion. Minimal  posterior subsegmental atelectasis. No appreciable bronchiectasis, mass, or nodule. No pathologic mediastinal adenopathy. No pericardial effusion. Mild coronary artery calcification.  BARIUM SWALLOW (08/11/15): No evidence for stricture or mass lesion. Moderate esophageal dysmotility without hiatal hernia. Small amount spontaneous reflux into the lower third of the esophagus.  CTA CHEST 08/03/15 (previously reviewed by me): Mild lower lobe predominant atelectasis. No other nodule or opacity appreciated. No ulnar emboli. No pleural effusion or thickening. No pericardial effusion. No pathologic mediastinal adenopathy.  CARDIAC TTE (07/07/15): Narrow LVOT with normal cavity size. Mild LVH. EF 55-60%. Grade 1 diastolic dysfunction. LA & RA normal in size. RV normal in size and function. No aortic regurgitation. No mitral stenosis or regurgitation. No pulmonic regurgitation. Mild tricuspid regurgitation. No pericardial effusion.  LABS 04/17/16 CRP:  0.2 ESR:  19 ANA:  Negative Anti-CCP:  <16 RF:  <14 SCL-70:  <1.0 Hypersensitivity Pneumonitis Panel:  Negative   08/02/15 CBC: 9.2/14.4/42.7/390 BMP: 133/4.3/98/28/17/1.09/94/9.5 LFT: 4.2/7.0/0.5/62/18/16    Assessment & Plan:  82 y.o. female with ongoing cough and Pleuritic chest pain. Patient's autoimmune workup was negative. CT imaging finds very mild basilar reticulation consistent with interstitial lung disease.  No other risk factors to suggest an etiology for her interstitial lung disease. No ground glass that would suggest ongoing inflammation. I believe she may benefit from a course of prednisone therapy for treatment of her pleurisy which could still be autoimmune in etiology. Instructed the patient to contact my office if she had any new breathing problems or questions before next appointment. Holding on current inhalers at this time.  1. ILD:  Pattern appears to be either early UIP or fibrotic NSIP. Mild. Monitoring symptoms on prednisone taper. 2. Atypical Chest Pain:  Likely secondary to pleurisy. Treating with prednisone taper over one month. 3. GERD/Dyspepsia: Monitoring symptoms off medication.  4. Health Maintenance:  S/P Influenza Vaccine November 2017, Prevnar January 2015, & Pneumovax January 1998.  5. Follow-up: Patient to return to clinic in 6 weeks or sooner if needed.  Sonia Baller Ashok Cordia, M.D. [facility] Pager:  [phone] After 3pm or if no response, call [phone] 11:20 AM 05/22/16

## 2016-05-22 NOTE — Patient Instructions (Signed)
   I'm starting you on a Prednisone taper:  4 pills daily for 7 days then take  3 pills daily for 7 days then take  2 pills daily for 7 days then take   1 pill daily until gone   Call me if your symptoms (cough or chest pain) come back as you wean down on your Prednisone.  I will see you back in 6 weeks or sooner if needed.

## 2016-05-24 DIAGNOSIS — H43813 Vitreous degeneration, bilateral: Secondary | ICD-10-CM | POA: Diagnosis not present

## 2016-05-24 DIAGNOSIS — H353211 Exudative age-related macular degeneration, right eye, with active choroidal neovascularization: Secondary | ICD-10-CM | POA: Diagnosis not present

## 2016-05-24 DIAGNOSIS — H43392 Other vitreous opacities, left eye: Secondary | ICD-10-CM | POA: Diagnosis not present

## 2016-05-24 DIAGNOSIS — H35371 Puckering of macula, right eye: Secondary | ICD-10-CM | POA: Diagnosis not present

## 2016-05-27 ENCOUNTER — Other Ambulatory Visit: Payer: Self-pay

## 2016-05-27 MED ORDER — AMIODARONE HCL 200 MG PO TABS: 100.0000 mg | ORAL_TABLET | Freq: Every day | ORAL | 3 refills | Status: DC

## 2016-05-27 MED ORDER — AMLODIPINE BESYLATE 2.5 MG PO TABS: 2.5000 mg | ORAL_TABLET | Freq: Every day | ORAL | 3 refills | Status: DC

## 2016-06-19 DIAGNOSIS — F4329 Adjustment disorder with other symptoms: Secondary | ICD-10-CM | POA: Diagnosis not present

## 2016-06-25 ENCOUNTER — Other Ambulatory Visit: Payer: Self-pay | Admitting: Internal Medicine

## 2016-06-28 ENCOUNTER — Telehealth: Payer: Self-pay | Admitting: Pulmonary Disease

## 2016-06-28 MED ORDER — BUDESONIDE-FORMOTEROL FUMARATE 80-4.5 MCG/ACT IN AERO: 1.0000 | INHALATION_SPRAY | Freq: Two times a day (BID) | RESPIRATORY_TRACT | 2 refills | Status: DC

## 2016-06-28 MED ORDER — CEPHALEXIN 500 MG PO CAPS: 500.0000 mg | ORAL_CAPSULE | Freq: Three times a day (TID) | ORAL | 0 refills | Status: DC

## 2016-06-28 NOTE — Telephone Encounter (Signed)
Spoke with the pt and notified of recs per MR  She verbalized understanding  Nothing further needed 

## 2016-06-28 NOTE — Telephone Encounter (Signed)
MR  Please advise-  I have spoken with this pt. And gave her your recc. She is concerned about taking another course of prednisone due to her tachycardia. She wanted to get your opinion of this.

## 2016-06-28 NOTE — Telephone Encounter (Signed)
Not sure what is going on . She is seeing him on  07/17/16  Till then  - Take prednisone 40 mg daily x 2 days, then 20mg  daily x 2 days, then 10mg  daily x 2 days, then 5mg  daily x 2 days and stop - restart symbicort - cephalexin 500mg  tid x 5 days  Please do tell her I do not have a good handle and if she is getting worse to go to ER  Dr. Kalman ShanMurali Zenaida Tesar, M.D., [facility] Pulmonary and Critical Care Medicine Staff Physician [facility] Pulmonary and Critical Care Pager: [phone], If no answer or between  15:00h - 7:00h: call [phone]  06/28/2016 12:51 PM      Allergies  Allergen Reactions  . Lisinopril     Unknown Pt states she's never taken Lisinopril  . Pneumovax 23 [Pneumococcal Vac Polyvalent] Other (See Comments)    Flu like symtpoms for 1 day  . Symbicort [Budesonide-Formoterol Fumarate]     Jittery, SOB, cough

## 2016-06-28 NOTE — Telephone Encounter (Signed)
Spoke with pt. States that she is having issues with her breathing. Reports DOE, chest tightness, wheezing. Denies coughing. JN had her on a prednisone taper but she has since finished that. JN advised her to stop Symbicort while she was on the prednisone. Pt is wanting something to help with her shortness of breath.  MR - please advise as JN is not available today. Thanks.

## 2016-06-28 NOTE — Telephone Encounter (Signed)
Prednisone typically does not cause tachycardia but symbicort can If she is very concerned for the weekend then she should go to ER - this is becuas a) I do not know her well; b) is Friday ; c) Dr [name] not here; d) looking through her chart I am puzzled how too help her  Dr. Kalman ShanMurali Courtlyn Aki, M.D., [facility] Pulmonary and Critical Care Medicine Staff Physician [facility] Pulmonary and Critical Care Pager: [phone], If no answer or between  15:00h - 7:00h: call [phone]  06/28/2016 1:25 PM

## 2016-06-28 NOTE — Telephone Encounter (Signed)
Forgot to note in my last message the cephalexin and a refill of her symbicort was sent in for the pt. To her pharmacy of choice.

## 2016-07-02 ENCOUNTER — Telehealth: Payer: Self-pay | Admitting: Internal Medicine

## 2016-07-02 NOTE — Telephone Encounter (Signed)
Pt follow up:   Pt is leaving home now and will call back after 2 pm she has another appointment.

## 2016-07-02 NOTE — Telephone Encounter (Signed)
Pt is calling back to follow up please give her a call back about her tachycardia episode.

## 2016-07-02 NOTE — Telephone Encounter (Signed)
PT CALL:   Per pt she is having Tachycardia  She has 4 episodes, last one Sunday 21st lasted  for 3 hr        Pt has questions about her Med. And would like a call back.

## 2016-07-03 NOTE — Telephone Encounter (Signed)
Discussed with Dr [name] and will have her added on to Dr Jenel LucksAllred's schedule.  I have left her a message regarding this

## 2016-07-03 NOTE — Telephone Encounter (Signed)
Left message for patient to call back  

## 2016-07-03 NOTE — Telephone Encounter (Signed)
New Message      Please call back she returning your call

## 2016-07-03 NOTE — Telephone Encounter (Signed)
Follow Up :  MRs. [place] is calling back to follow up on her phone call . She states she will be home until about 12 today

## 2016-07-03 NOTE — Telephone Encounter (Signed)
Patient was called back and she stated that she started taking prednisone in December for SOB. She states that after she started taking the prednisone she started having intermittent episodes of tachycardia. She states that when pulmonology initially prescribed her the prednisone they told her not to take the Symbicort, so she stopped taking the symbicort. She continued to have SOB so she consulted her pulmonologist again and they wanted there to start another round of prednisone. She states that she refused to take the prednisone since she felt like this was related to her tachycardia episodes. It has been 2 weeks since she last had prednisone. She has since started taking the Symbicort for her SOB. She has continued to have tachycardia episodes since taking the prednisone in December. She states that her last tachycardia episode was on Sunday and that it lasted for 3 hours. She states that her HR was 136. She did not note at that time what her BP was. She states that she took 3 of her diltiazem (cardizem) 30 mg tablets about and hour apart from each other and a xanax until the tachycardia resolved. She states that her diltiazem (cardizem) pills have been expired for 2 years.The patient would like to know if:  1) Should she ever take the prednisone again?  2) Should her amiodarone be increased (she takes 100 mg daily)?  3) Can she have a new prescription for diltiazem (cardizem)?  Please advise.

## 2016-07-04 NOTE — Telephone Encounter (Signed)
07/17/16 at 9:00 with Dr [name]

## 2016-07-10 DIAGNOSIS — N816 Rectocele: Secondary | ICD-10-CM | POA: Diagnosis not present

## 2016-07-17 ENCOUNTER — Ambulatory Visit (INDEPENDENT_AMBULATORY_CARE_PROVIDER_SITE_OTHER): Payer: Medicare Other | Admitting: Internal Medicine

## 2016-07-17 ENCOUNTER — Encounter: Payer: Self-pay | Admitting: Pulmonary Disease

## 2016-07-17 ENCOUNTER — Encounter: Payer: Self-pay | Admitting: Internal Medicine

## 2016-07-17 ENCOUNTER — Ambulatory Visit (INDEPENDENT_AMBULATORY_CARE_PROVIDER_SITE_OTHER): Payer: Medicare Other | Admitting: Pulmonary Disease

## 2016-07-17 VITALS — BP 126/78 | HR 80 | Ht 64.0 in | Wt 145.8 lb

## 2016-07-17 VITALS — BP 140/70 | HR 72 | Ht 63.0 in | Wt 144.8 lb

## 2016-07-17 DIAGNOSIS — K219 Gastro-esophageal reflux disease without esophagitis: Secondary | ICD-10-CM | POA: Diagnosis not present

## 2016-07-17 DIAGNOSIS — R0602 Shortness of breath: Secondary | ICD-10-CM | POA: Diagnosis not present

## 2016-07-17 DIAGNOSIS — R079 Chest pain, unspecified: Secondary | ICD-10-CM

## 2016-07-17 DIAGNOSIS — J849 Interstitial pulmonary disease, unspecified: Secondary | ICD-10-CM

## 2016-07-17 DIAGNOSIS — R0789 Other chest pain: Secondary | ICD-10-CM

## 2016-07-17 DIAGNOSIS — R0609 Other forms of dyspnea: Secondary | ICD-10-CM

## 2016-07-17 DIAGNOSIS — I471 Supraventricular tachycardia: Secondary | ICD-10-CM | POA: Diagnosis not present

## 2016-07-17 DIAGNOSIS — I10 Essential (primary) hypertension: Secondary | ICD-10-CM | POA: Diagnosis not present

## 2016-07-17 MED ORDER — DILTIAZEM HCL 30 MG PO TABS: ORAL_TABLET | ORAL | 1 refills | Status: DC

## 2016-07-17 NOTE — Patient Instructions (Signed)
   Call me if you have any questions or concerns before your next appointment.  I will see you back in 4 weeks or sooner if needed.

## 2016-07-17 NOTE — Patient Instructions (Addendum)
Medication Instructions:  Your physician has recommended you make the following change in your medication:  1) Stop Amiodarone   Labwork: Your physician recommends that you return for lab work today: BMP/CBC/INR   Testing/Procedures: Your physician has requested that you have a cardiac catheterization. Cardiac catheterization is used to diagnose and/or treat various heart conditions. Doctors may recommend this procedure for a number of different reasons. The most common reason is to evaluate chest pain. Chest pain can be a symptom of coronary artery disease (CAD), and cardiac catheterization can show whether plaque is narrowing or blocking your heart's arteries. This procedure is also used to evaluate the valves, as well as measure the blood flow and oxygen levels in different parts of your heart. For further information please visit [email or web address]. Please follow instruction sheet, as given.---on  _________  Call back on Friday after speaking with your daughter and we can set up a date  [facility] at_________ Do not eat or drink after midnight the night prior to the procedure Okay to take morning medications the morning of procedure---HOLD Furosemide Plan for one night stay Will need someone to drive you home at discharge    Follow-Up: Your physician recommends that you schedule a follow-up appointment in: 6 weeks with Dr [name]   Any Other Special Instructions Will Be Listed Below (If Applicable).     If you need a refill on your cardiac medications before your next appointment, please call your pharmacy.  f

## 2016-07-17 NOTE — Progress Notes (Signed)
Subjective:    Patient ID: Alexandra Reyes, female    DOB: Aug 26, 1925, 83 y.o.   MRN: [id number]  C.C.:  Follow-up for ILD, Atypical Chest Pain, & GERD.  HPI ILD: Early UIP versus fibrotic MS IP pattern on CT imaging. Likely very early given minimal signs on CT imaging. Previously on a prednisone taper. Patient's last dose of Prednisone was January 9. She had noticed increased dyspnea starting on January 18. She continues to have a cough intermittently productive of a white mucus. She was prescribed a course of Keflex.   Atypical Chest Pain:  Responded to prednisone therapy. Likely pleuritic in origin. She continues to have pleurisy that seems to be worsening somewhat.  GERD: Previously evaluated by GI. Medications previously stopped. Still without any reflux or dyspepsia. She is currently on Prilosec. She has taken Tums intermittently.   Review of Systems Patient reports she did have palpitations in late December & again in January twice. She did have sweats on Prednisone. Denies any fever or chills recently.   Allergies  Allergen Reactions  . Lisinopril     Unknown Pt states she's never taken Lisinopril  . Pneumovax 23 [Pneumococcal Vac Polyvalent] Other (See Comments)    Flu like symtpoms for 1 day  . Symbicort [Budesonide-Formoterol Fumarate]     Jittery, SOB, cough    Current Outpatient Prescriptions on File Prior to Visit  Medication Sig Dispense Refill  . acetaminophen (TYLENOL) 325 MG tablet Take 650 mg by mouth every 6 (six) hours as needed (joint pain).    Marland Kitchen ALPRAZolam (XANAX) 0.25 MG tablet Take 1 tablet (0.25 mg total) by mouth at bedtime as needed for anxiety. 30 tablet 5  . amLODipine (NORVASC) 2.5 MG tablet Take 1 tablet (2.5 mg total) by mouth daily. 90 tablet 3  . Ascorbic Acid (VITAMIN C PO) Take 1 tablet by mouth daily. Reported on 08/08/2015    . aspirin 81 MG tablet Take 81 mg by mouth daily.      . B Complex Vitamins (VITAMIN B COMPLEX PO) Take 1 tablet by  mouth daily.     . budesonide-formoterol (SYMBICORT) 80-4.5 MCG/ACT inhaler Inhale 1 puff into the lungs 2 (two) times daily. 1 Inhaler 2  . calcium carbonate (OS-CAL) 600 MG tablet Take 600 mg by mouth daily.    Marland Kitchen CARTIA XT 120 MG 24 hr capsule TAKE 1 CAPSULE BY MOUTH DAILY 90 capsule 1  . docusate sodium (COLACE) 100 MG capsule Take 100 mg by mouth as needed for mild constipation.    . ferrous sulfate 325 (65 FE) MG tablet Take 325 mg by mouth daily.     . furosemide (LASIX) 40 MG tablet TAKE 1 TABLET (40 MG TOTAL) BY MOUTH DAILY. 90 tablet 2  . Glucosamine HCl-MSM (GLUCOSAMINE-MSM PO) Take 2 tablets by mouth daily.     . nitroGLYCERIN (NITROSTAT) 0.4 MG SL tablet Place 1 tablet (0.4 mg total) under the tongue every 5 (five) minutes as needed for chest pain. 25 tablet 3  . omeprazole (PRILOSEC) 40 MG capsule Take 1 capsule (40 mg total) by mouth daily. 30 capsule 6  . simvastatin (ZOCOR) 10 MG tablet Take 1 tablet (10 mg total) by mouth daily at 6 PM. 90 tablet 3  . triamcinolone cream (KENALOG) 0.1 % Apply 1 application topically 2 (two) times daily. Apply to affected area    . vitamin E 200 UNIT capsule Take 200 Units by mouth daily.    . predniSONE (DELTASONE) 10 MG  tablet Take 4 tabs daily x 7 days, 3 tabs daily x 7 days, 2 tabs daily 7 x days, 1 tab daily x 7 days then stop. (Patient not taking: Reported on 07/17/2016) 70 tablet 0   No current facility-administered medications on file prior to visit.     Past Medical History:  Diagnosis Date  . Alcohol dependence in remission ([place]) 05/22/2011  . Allergic rhinitis, cause unspecified 05/22/2011  . Allergy   . Anemia, iron deficiency 05/18/2011  . Cholelithiasis   . COPD (chronic obstructive pulmonary disease) ([place])   . DDD (degenerative disc disease), lumbar 05/18/2011  . First degree AV block   . GERD (gastroesophageal reflux disease)   . Hiatal hernia   . HTN (hypertension) 05/18/2011  . Hyperlipidemia 05/18/2011  . Macular  degeneration   . Myocardial infarction   . OA (osteoarthritis)   . Osteoporosis 05/18/2011  . PAT (paroxysmal atrial tachycardia) ([place])   . Pectus excavatum 05/18/2011  . Personal history of colonic polyps 10/28/2012  . SVT (supraventricular tachycardia) ([facility])     Past Surgical History:  Procedure Laterality Date  . ABDOMINAL HYSTERECTOMY  1988  . BREAST BIOPSY  1948  . BREAST LUMPECTOMY    . CARDIOVASCULAR STRESS TEST  03/08/2009   EF 84%  . TONSILLECTOMY AND ADENOIDECTOMY    . US ECHOCARDIOGRAPHY  01/17/2003   EF 55-60%    Family History  Problem Relation Age of Onset  . Heart failure Mother   . Arthritis Mother   . Kidney failure Father   . Heart disease Father   . Hypertension Father     Social History   Social History  . Marital status: Widowed    Spouse name: N/A  . Number of children: Y  . Years of education: 70   Occupational History  . Retired Equities trader    Social History Main Topics  . Smoking status: Former Smoker    Packs/day: 1.50    Years: 36.00    Types: Cigarettes    Start date: 03/01/1959    Quit date: 06/10/1994  . Smokeless tobacco: Never Used     Comment: smoked 1- 1.5 ppd.   . Alcohol use No  . Drug use: No  . Sexual activity: Not Asked   Other Topics Concern  . None   Social History Narrative   Originally from [place], [place]. She moved to [place] in 1950 during the polio epidemic. Previously worked as a Marine scientist. No pets currently. Remote bird exposure. No mold or hot tub exposure.       Objective:   Physical Exam BP 140/70 (BP Location: Right Arm, Patient Position: Sitting, Cuff Size: Normal)   Pulse 72   Ht '5\' 3"'$  (1.6 m)   Wt 144 lb 12.8 oz (65.7 kg)   LMP  (LMP Unknown)   SpO2 97%   BMI 25.65 kg/m   General:  Awake. Alert. No distress.  Integument:  Warm & dry. No rash on exposed skin.  Extremities:  No cyanosis or clubbing.  HEENT:  Moist mucus membranes. No oral ulcers. Mild bilateral nasal turbinate swelling. No scleral  icterus. Cardiovascular:  Regular rate. Trace edema. No appreciable JVD.  Pulmonary:  Clear on auscultation. No accessory muscle use on room air. Good aeration bilaterally. Abdomen: Soft. Normal bowel sounds. Nondistended. . Musculoskeletal:  Normal bulk and tone. No joint deformity or effusion appreciated.  PFT 07/12/15: FVC 2.29 L (106%) FEV1 1.57 L (99%) FEV1/FVC 0.68 FEF 25-75 0.78 L (81%) no  bronchodilator response TLC 3.59 L (73%) RV 52% ERV 342% DLCO uncorrected 52% 05/14/13: FVC 2.34 L (103%) FEV1 1.63 L (97%) FEV1/FVC 0.69 FEF 25-75 0.83 L (76%) no bronchodilator response TLC 4.51 L (92%) ERV 284% DLCO uncorrected 55%  IMAGING CT CHEST W/O 04/03/16 (previously reviewed by me):  No pleural effusion or thickening. No pathologic mediastinal adenopathy. No pericardial effusion. Basilar predominant subpleural jugular changes that are mild and more pronounced on CT imaging.  CT CHEST W/O 10/09/15 (previously reviewed by me):  No pneumothorax or effusion. Minimal posterior subsegmental atelectasis. No appreciable bronchiectasis, mass, or nodule. No pathologic mediastinal adenopathy. No pericardial effusion. Mild coronary artery calcification.  BARIUM SWALLOW (08/11/15): No evidence for stricture or mass lesion. Moderate esophageal dysmotility without hiatal hernia. Small amount spontaneous reflux into the lower third of the esophagus.  CTA CHEST 08/03/15 (previously reviewed by me): Mild lower lobe predominant atelectasis. No other nodule or opacity appreciated. No ulnar emboli. No pleural effusion or thickening. No pericardial effusion. No pathologic mediastinal adenopathy.  CARDIAC TTE (07/07/15): Narrow LVOT with normal cavity size. Mild LVH. EF 55-60%. Grade 1 diastolic dysfunction. LA & RA normal in size. RV normal in size and function. No aortic regurgitation. No mitral stenosis or regurgitation. No pulmonic regurgitation. Mild tricuspid regurgitation. No pericardial  effusion.  LABS 04/17/16 CRP:  0.2 ESR:  19 ANA:  Negative Anti-CCP:  <16 RF:  <14 SCL-70:  <1.0 Hypersensitivity Pneumonitis Panel:  Negative   08/02/15 CBC: 9.2/14.4/42.7/390 BMP: 133/4.3/98/28/17/1.09/94/9.5 LFT: 4.2/7.0/0.5/62/18/16    Assessment & Plan:  83 y.o. female with pleuritic/atypical chest pain and early interstitial lung disease on CT imaging. Patient is having intermittent SVT likely secondary to steroids. She did achieve some symptomatic benefit with prednisone therapy in regard to her atypical chest pain and dyspnea. I do question whether or not she could have some mild amiodarone lung toxicity but the pattern is not indicative of such. I instructed the patient contact my office if she had any breathing problems and will follow-up with her post her planned cardiac interventions.  1. ILD:  Holding off on further prednisone therapy given palpitations and worsening SVT. 2. Atypical Chest Pain:  Likely secondary to pleurisy given symptomatic improvement on prednisone. Holding off on prednisone at this time. 3. GERD/Dyspepsia: Continuing Prilosec. No changes at this time. 4. Health Maintenance:  S/P Influenza Vaccine November 2017, Prevnar January 2015, & Pneumovax January 1998.  5. Follow-up: Patient to return to clinic in 4 weeks or sooner if needed.  Sonia Baller Ashok Cordia, M.D. [facility] Pulmonary & Critical Care Pager:  [phone] After 3pm or if no response, call [phone] 11:59 AM 07/17/16

## 2016-07-18 LAB — BASIC METABOLIC PANEL
BUN / CREAT RATIO: 12 (ref 12–28)
BUN: 14 mg/dL (ref 8–27)
CHLORIDE: 96 mmol/L (ref 96–106)
CO2: 20 mmol/L (ref 18–29)
Calcium: 9.3 mg/dL (ref 8.7–10.3)
Creatinine, Ser: 1.15 mg/dL — ABNORMAL HIGH (ref 0.57–1.00)
GFR calc Af Amer: 49 mL/min/{1.73_m2} — ABNORMAL LOW (ref >59)
GFR calc non Af Amer: 43 mL/min/{1.73_m2} — ABNORMAL LOW (ref >59)
GLUCOSE: 90 mg/dL (ref 65–99)
Potassium: 4.6 mmol/L (ref 3.5–5.2)
Sodium: 136 mmol/L (ref 134–144)

## 2016-07-18 LAB — CBC WITH DIFFERENTIAL/PLATELET
BASOS ABS: 0.1 10*3/uL (ref 0.0–0.2)
Basos: 1 %
EOS (ABSOLUTE): 0.2 10*3/uL (ref 0.0–0.4)
Eos: 2 %
HEMOGLOBIN: 13.9 g/dL (ref 11.1–15.9)
Hematocrit: 41.7 % (ref 34.0–46.6)
Immature Grans (Abs): 0.1 10*3/uL (ref 0.0–0.1)
Immature Granulocytes: 1 %
LYMPHS ABS: 1.3 10*3/uL (ref 0.7–3.1)
Lymphs: 13 %
MCH: 29.7 pg (ref 26.6–33.0)
MCHC: 33.3 g/dL (ref 31.5–35.7)
MCV: 89 fL (ref 79–97)
MONOCYTES: 9 %
MONOS ABS: 1 10*3/uL — AB (ref 0.1–0.9)
Neutrophils Absolute: 8 10*3/uL — ABNORMAL HIGH (ref 1.4–7.0)
Neutrophils: 74 %
PLATELETS: 387 10*3/uL — AB (ref 150–379)
RBC: 4.68 x10E6/uL (ref 3.77–5.28)
RDW: 14.7 % (ref 12.3–15.4)
WBC: 10.6 10*3/uL (ref 3.4–10.8)

## 2016-07-18 LAB — PROTIME-INR
INR: 1 (ref 0.8–1.2)
PROTHROMBIN TIME: 10.6 s (ref 9.1–12.0)

## 2016-07-18 NOTE — Progress Notes (Signed)
Electrophysiology Office Note   Date:  07/18/2016   ID:  Addalyn, Speedy [birth year], MRN [id number]  PCP:  Oliver Barre, MD  Cardiologist:  Dr Anne Fu, (previously Patty Sermons) Primary Electrophysiologist: Hillis Range, MD    Chief Complaint  Patient presents with  . Shortness of Breath     History of Present Illness: EBONI COVAL is a 83 y.o. female who presents today for electrophysiology evaluation.  She continues to have worsening shortness of breath.  She has also noticed recent substernal chest pain.  Due to lung disease, I previously reduced her amiodarone to 100mg  daily.  Unfortunately with this approach, she has had several episodes of atach.      Past Medical History:  Diagnosis Date  . Alcohol dependence in remission ([facility]) 05/22/2011  . Allergic rhinitis, cause unspecified 05/22/2011  . Allergy   . Anemia, iron deficiency 05/18/2011  . Cholelithiasis   . COPD (chronic obstructive pulmonary disease) ([facility])   . DDD (degenerative disc disease), lumbar 05/18/2011  . First degree AV block   . GERD (gastroesophageal reflux disease)   . Hiatal hernia   . HTN (hypertension) 05/18/2011  . Hyperlipidemia 05/18/2011  . Macular degeneration   . Myocardial infarction   . OA (osteoarthritis)   . Osteoporosis 05/18/2011  . PAT (paroxysmal atrial tachycardia) ([facility])   . Pectus excavatum 05/18/2011  . Personal history of colonic polyps 10/28/2012  . SVT (supraventricular tachycardia) ([facility])    Past Surgical History:  Procedure Laterality Date  . ABDOMINAL HYSTERECTOMY  1988  . BREAST BIOPSY  1948  . BREAST LUMPECTOMY    . CARDIOVASCULAR STRESS TEST  03/08/2009   EF 84%  . TONSILLECTOMY AND ADENOIDECTOMY    . US ECHOCARDIOGRAPHY  01/17/2003   EF 55-60%     Current Outpatient Prescriptions  Medication Sig Dispense Refill  . acetaminophen (TYLENOL) 325 MG tablet Take 650 mg by mouth every 6 (six) hours as needed (joint pain).    Marland Kitchen ALPRAZolam (XANAX) 0.25 MG tablet Take 1  tablet (0.25 mg total) by mouth at bedtime as needed for anxiety. 30 tablet 5  . amLODipine (NORVASC) 2.5 MG tablet Take 1 tablet (2.5 mg total) by mouth daily. 90 tablet 3  . Ascorbic Acid (VITAMIN C PO) Take 1 tablet by mouth daily. Reported on 08/08/2015    . aspirin 81 MG tablet Take 81 mg by mouth daily.      . B Complex Vitamins (VITAMIN B COMPLEX PO) Take 1 tablet by mouth daily.     . budesonide-formoterol (SYMBICORT) 80-4.5 MCG/ACT inhaler Inhale 1 puff into the lungs 2 (two) times daily. 1 Inhaler 2  . calcium carbonate (OS-CAL) 600 MG tablet Take 600 mg by mouth daily.    Marland Kitchen CARTIA XT 120 MG 24 hr capsule TAKE 1 CAPSULE BY MOUTH DAILY 90 capsule 1  . diltiazem (CARDIZEM) 30 MG tablet Take 1-2 tablets by mouth every 6 hours as needed for fast heart rates 30 tablet 1  . docusate sodium (COLACE) 100 MG capsule Take 100 mg by mouth as needed for mild constipation.    . ferrous sulfate 325 (65 FE) MG tablet Take 325 mg by mouth daily.     . furosemide (LASIX) 40 MG tablet TAKE 1 TABLET (40 MG TOTAL) BY MOUTH DAILY. 90 tablet 2  . Glucosamine HCl-MSM (GLUCOSAMINE-MSM PO) Take 2 tablets by mouth daily.     . nitroGLYCERIN (NITROSTAT) 0.4 MG SL tablet Place 1 tablet (0.4 mg total) under the  tongue every 5 (five) minutes as needed for chest pain. 25 tablet 3  . omeprazole (PRILOSEC) 40 MG capsule Take 1 capsule (40 mg total) by mouth daily. 30 capsule 6  . predniSONE (DELTASONE) 10 MG tablet Take 4 tabs daily x 7 days, 3 tabs daily x 7 days, 2 tabs daily 7 x days, 1 tab daily x 7 days then stop. (Patient not taking: Reported on 07/17/2016) 70 tablet 0  . simvastatin (ZOCOR) 10 MG tablet Take 1 tablet (10 mg total) by mouth daily at 6 PM. 90 tablet 3  . triamcinolone cream (KENALOG) 0.1 % Apply 1 application topically 2 (two) times daily. Apply to affected area    . vitamin E 200 UNIT capsule Take 200 Units by mouth daily.     No current facility-administered medications for this visit.      Allergies:   Lisinopril; Pneumovax 23 [pneumococcal vac polyvalent]; and Symbicort [budesonide-formoterol fumarate]   Social History:  The patient  reports that she quit smoking about 22 years ago. Her smoking use included Cigarettes. She started smoking about 57 years ago. She has a 54.00 pack-year smoking history. She has never used smokeless tobacco. She reports that she does not drink alcohol or use drugs.   Family History:  The patient's family history includes Arthritis in her mother; Heart disease in her father; Heart failure in her mother; Hypertension in her father; Kidney failure in her father.    ROS:  Please see the history of present illness.   All other systems are reviewed and negative.    PHYSICAL EXAM: VS:  BP 126/78   Pulse 80   Ht 5\' 4"  (1.626 m)   Wt 145 lb 12.8 oz (66.1 kg)   LMP  (LMP Unknown)   SpO2 94%   BMI 25.03 kg/m  , BMI Body mass index is 25.03 kg/m. GEN: elderly, in no acute distress  HEENT: normal  Neck: no JVD, carotid bruits, or masses Cardiac: RRR; no murmurs, rubs, or gallops,+1 edema , support hose in place Respiratory:  clear to auscultation bilaterally, normal work of breathing GI: soft, nontender, nondistended, + BS MS: no deformity or atrophy  Skin: warm and dry, leukonychia noted Neuro:  Strength and sensation are intact Psych: euthymic mood, full affect  EKG:  Ordered today reveals accelerated junctional rhythm 80 bpm, inferior infarct pattern, anterolateral infarct pattern   Recent Labs: 02/14/2016: ALT 22; Hemoglobin 14.0; TSH 1.42 07/17/2016: BUN 14; Creatinine, Ser 1.15; Platelets 387; Potassium 4.6; Sodium 136    Lipid Panel     Component Value Date/Time   CHOL 167 05/24/2015 0831   TRIG 71 05/24/2015 0831   HDL 84 05/24/2015 0831   CHOLHDL 2.0 05/24/2015 0831   VLDL 14 05/24/2015 0831   LDLCALC 69 05/24/2015 0831   LDLDIRECT 129.2 05/15/2011 0925     Wt Readings from Last 3 Encounters:  07/17/16 144 lb 12.8 oz  (65.7 kg)  07/17/16 145 lb 12.8 oz (66.1 kg)  05/22/16 143 lb 3.2 oz (65 kg)     ASSESSMENT AND PLAN:  1.  SOB/ chest pain I worry that given the progressive nature of her symptoms that she could have CAD as the cause.  I have reviewed with Dr Anne FuSkains this am.  We agree that further CV risk stratification is required at this time.  I have therefore advised RHC/LHC with possible PCI.  Risks of procedure discussed at length with patient who wishes to proceed.  We will schedule cath at  next available time. She also requires aggressive management of her active lung disease by pulmonary.  2. SVT Long RP,  Likely atach with first degree AV block.  GIven progressive SOB, will stop amiodarone at this time.  Our options going forward would be tikosyn or ablation.  Given her advanced age, I have tried to avoid ablation.  We will reassess post cath.  3. HTN Stable No change required today bmet  4. Venous insufficiency Support hose  Return to see me in 6 weeks Follow-up with Dr Anne FuSkains as scheduled  Very complicated situation in an elderly female.  A high level of decision making was require for this encounter including discussion with Dr Anne FuSkains.  Signed, Hillis RangeJames Dorwin Fitzhenry, MD      [facility] [address] [place] [address] [phone] (office) [phone] (fax)

## 2016-07-19 ENCOUNTER — Telehealth: Payer: Self-pay | Admitting: Internal Medicine

## 2016-07-19 NOTE — Telephone Encounter (Signed)
Follow Up   Per ptt calling back stating she is hadn't heard anything yet. Requesting call back.

## 2016-07-19 NOTE — Telephone Encounter (Signed)
07/25/16---L/R heart cath at 9am.   Be there at 7am NPO after MN Will need someone to drive you home. Okay to take medications the morning of the procedure.  Hold Furosemide

## 2016-07-19 NOTE — Telephone Encounter (Signed)
NEW MESSAGE      RETURNING YOUR CALL TO SET UP HEART CATH, PLEASE CALL , THANK YOU

## 2016-07-25 ENCOUNTER — Ambulatory Visit (HOSPITAL_COMMUNITY)
Admission: RE | Admit: 2016-07-25 | Discharge: 2016-07-25 | Disposition: A | Discharge status: Home / Self Care | Payer: Medicare Other | Source: Ambulatory Visit | Attending: Cardiovascular Disease | Admitting: Cardiovascular Disease

## 2016-07-25 ENCOUNTER — Encounter (HOSPITAL_COMMUNITY): Payer: Self-pay | Admitting: Cardiovascular Disease

## 2016-07-25 ENCOUNTER — Encounter (HOSPITAL_COMMUNITY)
Admission: RE | Disposition: A | Discharge status: Home / Self Care | Payer: Self-pay | Source: Ambulatory Visit | Attending: Cardiovascular Disease

## 2016-07-25 DIAGNOSIS — I2582 Chronic total occlusion of coronary artery: Secondary | ICD-10-CM | POA: Diagnosis not present

## 2016-07-25 DIAGNOSIS — K449 Diaphragmatic hernia without obstruction or gangrene: Secondary | ICD-10-CM | POA: Diagnosis not present

## 2016-07-25 DIAGNOSIS — I252 Old myocardial infarction: Secondary | ICD-10-CM | POA: Insufficient documentation

## 2016-07-25 DIAGNOSIS — I10 Essential (primary) hypertension: Secondary | ICD-10-CM | POA: Insufficient documentation

## 2016-07-25 DIAGNOSIS — Q676 Pectus excavatum: Secondary | ICD-10-CM | POA: Insufficient documentation

## 2016-07-25 DIAGNOSIS — H3530 Unspecified macular degeneration: Secondary | ICD-10-CM | POA: Diagnosis not present

## 2016-07-25 DIAGNOSIS — E785 Hyperlipidemia, unspecified: Secondary | ICD-10-CM | POA: Diagnosis not present

## 2016-07-25 DIAGNOSIS — Z7951 Long term (current) use of inhaled steroids: Secondary | ICD-10-CM | POA: Insufficient documentation

## 2016-07-25 DIAGNOSIS — Z7952 Long term (current) use of systemic steroids: Secondary | ICD-10-CM | POA: Insufficient documentation

## 2016-07-25 DIAGNOSIS — F1021 Alcohol dependence, in remission: Secondary | ICD-10-CM | POA: Insufficient documentation

## 2016-07-25 DIAGNOSIS — I25110 Atherosclerotic heart disease of native coronary artery with unstable angina pectoris: Secondary | ICD-10-CM | POA: Diagnosis not present

## 2016-07-25 DIAGNOSIS — I872 Venous insufficiency (chronic) (peripheral): Secondary | ICD-10-CM | POA: Insufficient documentation

## 2016-07-25 DIAGNOSIS — J449 Chronic obstructive pulmonary disease, unspecified: Secondary | ICD-10-CM | POA: Insufficient documentation

## 2016-07-25 DIAGNOSIS — M5136 Other intervertebral disc degeneration, lumbar region: Secondary | ICD-10-CM | POA: Diagnosis not present

## 2016-07-25 DIAGNOSIS — I2510 Atherosclerotic heart disease of native coronary artery without angina pectoris: Secondary | ICD-10-CM | POA: Diagnosis not present

## 2016-07-25 DIAGNOSIS — Z87891 Personal history of nicotine dependence: Secondary | ICD-10-CM | POA: Diagnosis not present

## 2016-07-25 DIAGNOSIS — K219 Gastro-esophageal reflux disease without esophagitis: Secondary | ICD-10-CM | POA: Insufficient documentation

## 2016-07-25 DIAGNOSIS — Z8249 Family history of ischemic heart disease and other diseases of the circulatory system: Secondary | ICD-10-CM | POA: Insufficient documentation

## 2016-07-25 DIAGNOSIS — I471 Supraventricular tachycardia: Secondary | ICD-10-CM | POA: Insufficient documentation

## 2016-07-25 DIAGNOSIS — D509 Iron deficiency anemia, unspecified: Secondary | ICD-10-CM | POA: Diagnosis not present

## 2016-07-25 DIAGNOSIS — Z7982 Long term (current) use of aspirin: Secondary | ICD-10-CM | POA: Insufficient documentation

## 2016-07-25 HISTORY — PX: RIGHT/LEFT HEART CATH AND CORONARY ANGIOGRAPHY: CATH118266

## 2016-07-25 LAB — POCT I-STAT 3, VENOUS BLOOD GAS (G3P V)
ACID-BASE DEFICIT: 1 mmol/L (ref 0.0–2.0)
BICARBONATE: 21.9 mmol/L (ref 20.0–28.0)
O2 SAT: 73 %
TCO2: 23 mmol/L (ref 0–100)
pCO2, Ven: 30.9 mmHg — ABNORMAL LOW (ref 44.0–60.0)
pH, Ven: 7.459 — ABNORMAL HIGH (ref 7.250–7.430)
pO2, Ven: 36 mmHg (ref 32.0–45.0)

## 2016-07-25 LAB — POCT I-STAT 3, ART BLOOD GAS (G3+)
Bicarbonate: 22.3 mmol/L (ref 20.0–28.0)
O2 SAT: 91 %
TCO2: 23 mmol/L (ref 0–100)
pCO2 arterial: 30.7 mmHg — ABNORMAL LOW (ref 32.0–48.0)
pH, Arterial: 7.47 — ABNORMAL HIGH (ref 7.350–7.450)
pO2, Arterial: 57 mmHg — ABNORMAL LOW (ref 83.0–108.0)

## 2016-07-25 SURGERY — RIGHT/LEFT HEART CATH AND CORONARY ANGIOGRAPHY
Anesthesia: LOCAL

## 2016-07-25 MED ORDER — FENTANYL CITRATE (PF) 100 MCG/2ML IJ SOLN
INTRAMUSCULAR | Status: AC
  Filled 2016-07-25: qty 2

## 2016-07-25 MED ORDER — HEPARIN SODIUM (PORCINE) 1000 UNIT/ML IJ SOLN
INTRAMUSCULAR | Status: AC
  Filled 2016-07-25: qty 1

## 2016-07-25 MED ORDER — ASPIRIN 81 MG PO CHEW
CHEWABLE_TABLET | ORAL | Status: AC
  Administered 2016-07-25: 81 mg via ORAL
  Filled 2016-07-25: qty 1

## 2016-07-25 MED ORDER — LIDOCAINE HCL (PF) 1 % IJ SOLN
INTRAMUSCULAR | Status: AC
  Filled 2016-07-25: qty 30

## 2016-07-25 MED ORDER — SODIUM CHLORIDE 0.9 % IV SOLN: 250.0000 mL | INTRAVENOUS | Status: DC | PRN

## 2016-07-25 MED ORDER — LIDOCAINE HCL (PF) 1 % IJ SOLN
INTRAMUSCULAR | Status: DC | PRN
  Administered 2016-07-25 (×2): 2 mL via INTRADERMAL

## 2016-07-25 MED ORDER — VERAPAMIL HCL 2.5 MG/ML IV SOLN
INTRAVENOUS | Status: DC | PRN
  Administered 2016-07-25: 10 mL via INTRA_ARTERIAL

## 2016-07-25 MED ORDER — HEPARIN (PORCINE) IN NACL 2-0.9 UNIT/ML-% IJ SOLN
INTRAMUSCULAR | Status: AC
  Filled 2016-07-25: qty 500

## 2016-07-25 MED ORDER — IOPAMIDOL (ISOVUE-370) INJECTION 76%
INTRAVENOUS | Status: DC | PRN
  Administered 2016-07-25: 70 mL via INTRA_ARTERIAL

## 2016-07-25 MED ORDER — HEPARIN (PORCINE) IN NACL 2-0.9 UNIT/ML-% IJ SOLN
INTRAMUSCULAR | Status: AC
  Filled 2016-07-25: qty 1000

## 2016-07-25 MED ORDER — HEPARIN (PORCINE) IN NACL 2-0.9 UNIT/ML-% IJ SOLN
INTRAMUSCULAR | Status: DC | PRN
  Administered 2016-07-25: 1500 mL

## 2016-07-25 MED ORDER — VERAPAMIL HCL 2.5 MG/ML IV SOLN
INTRAVENOUS | Status: AC
  Filled 2016-07-25: qty 2

## 2016-07-25 MED ORDER — SODIUM CHLORIDE 0.9% FLUSH: 3.0000 mL | INTRAVENOUS | Status: DC | PRN

## 2016-07-25 MED ORDER — ASPIRIN 81 MG PO CHEW
81.0000 mg | CHEWABLE_TABLET | ORAL | Status: AC
  Administered 2016-07-25: 81 mg via ORAL

## 2016-07-25 MED ORDER — SODIUM CHLORIDE 0.9 % WEIGHT BASED INFUSION
3.0000 mL/kg/h | INTRAVENOUS | Status: AC
  Administered 2016-07-25: 3 mL/kg/h via INTRAVENOUS

## 2016-07-25 MED ORDER — SODIUM CHLORIDE 0.9% FLUSH: 3.0000 mL | Freq: Two times a day (BID) | INTRAVENOUS | Status: DC

## 2016-07-25 MED ORDER — MIDAZOLAM HCL 2 MG/2ML IJ SOLN
INTRAMUSCULAR | Status: DC | PRN
  Administered 2016-07-25: 1 mg via INTRAVENOUS
  Administered 2016-07-25: 0.5 mg via INTRAVENOUS

## 2016-07-25 MED ORDER — MIDAZOLAM HCL 2 MG/2ML IJ SOLN
INTRAMUSCULAR | Status: AC
  Filled 2016-07-25: qty 2

## 2016-07-25 MED ORDER — IOPAMIDOL (ISOVUE-370) INJECTION 76%
INTRAVENOUS | Status: AC
  Filled 2016-07-25: qty 100

## 2016-07-25 MED ORDER — SODIUM CHLORIDE 0.9 % IV SOLN: INTRAVENOUS | Status: AC

## 2016-07-25 MED ORDER — FENTANYL CITRATE (PF) 100 MCG/2ML IJ SOLN
INTRAMUSCULAR | Status: DC | PRN
  Administered 2016-07-25 (×2): 25 ug via INTRAVENOUS

## 2016-07-25 MED ORDER — HEPARIN SODIUM (PORCINE) 1000 UNIT/ML IJ SOLN
INTRAMUSCULAR | Status: DC | PRN
  Administered 2016-07-25: 3500 [IU] via INTRAVENOUS

## 2016-07-25 MED ORDER — SODIUM CHLORIDE 0.9 % WEIGHT BASED INFUSION: 1.0000 mL/kg/h | INTRAVENOUS | Status: DC

## 2016-07-25 SURGICAL SUPPLY — 17 items
CATH BALLN WEDGE 5F 110CM (CATHETERS) ×2 IMPLANT
CATH EXPO 5FR AL1 (CATHETERS) ×2 IMPLANT
CATH IMPULSE 5F ANG/FL3.5 (CATHETERS) ×2 IMPLANT
CATH INFINITI 5 FR JL3.5 (CATHETERS) ×2 IMPLANT
CATH INFINITI JR4 5F (CATHETERS) ×2 IMPLANT
CATH LAUNCHER 5F EBU3.0 (CATHETERS) ×1 IMPLANT
CATHETER LAUNCHER 5F EBU3.0 (CATHETERS) ×2
DEVICE RAD COMP TR BAND LRG (VASCULAR PRODUCTS) ×2 IMPLANT
GLIDESHEATH SLEND SS 6F .021 (SHEATH) ×4 IMPLANT
GUIDEWIRE .025 260CM (WIRE) ×2 IMPLANT
GUIDEWIRE INQWIRE 1.5J.035X260 (WIRE) ×1 IMPLANT
INQWIRE 1.5J .035X260CM (WIRE) ×2
KIT HEART LEFT (KITS) ×2 IMPLANT
PACK CARDIAC CATHETERIZATION (CUSTOM PROCEDURE TRAY) ×2 IMPLANT
SHEATH FAST CATH BRACH 5F 5CM (SHEATH) ×2 IMPLANT
TRANSDUCER W/STOPCOCK (MISCELLANEOUS) ×2 IMPLANT
TUBING CIL FLEX 10 FLL-RA (TUBING) ×2 IMPLANT

## 2016-07-25 NOTE — Progress Notes (Signed)
TR band removed and occlusive dressing applied. Site unremarkable.

## 2016-07-25 NOTE — H&P (View-Only) (Signed)
 Electrophysiology Office Note   Date:  07/18/2016   ID:  Alexandra Reyes, DOB 03/26/1926, MRN [id number]  PCP:  Arijana Narayan [name], MD  Cardiologist:  Dr Skains, (previously Brackbill) Primary Electrophysiologist: Denson Niccoli, MD    Chief Complaint  Patient presents with  . Shortness of Breath     History of Present Illness: Alexandra Reyes is a 83 y.o. female who presents today for electrophysiology evaluation.  She continues to have worsening shortness of breath.  She has also noticed recent substernal chest pain.  Due to lung disease, I previously reduced her amiodarone to 100mg daily.  Unfortunately with this approach, she has had several episodes of atach.      Past Medical History:  Diagnosis Date  . Alcohol dependence in remission (HCC) 05/22/2011  . Allergic rhinitis, cause unspecified 05/22/2011  . Allergy   . Anemia, iron deficiency 05/18/2011  . Cholelithiasis   . COPD (chronic obstructive pulmonary disease) (HCC)   . DDD (degenerative disc disease), lumbar 05/18/2011  . First degree AV block   . GERD (gastroesophageal reflux disease)   . Hiatal hernia   . HTN (hypertension) 05/18/2011  . Hyperlipidemia 05/18/2011  . Macular degeneration   . Myocardial infarction   . OA (osteoarthritis)   . Osteoporosis 05/18/2011  . PAT (paroxysmal atrial tachycardia) (HCC)   . Pectus excavatum 05/18/2011  . Personal history of colonic polyps 10/28/2012  . SVT (supraventricular tachycardia) (HCC)    Past Surgical History:  Procedure Laterality Date  . ABDOMINAL HYSTERECTOMY  1988  . BREAST BIOPSY  1948  . BREAST LUMPECTOMY    . CARDIOVASCULAR STRESS TEST  03/08/2009   EF 84%  . TONSILLECTOMY AND ADENOIDECTOMY    . US ECHOCARDIOGRAPHY  01/17/2003   EF 55-60%     Current Outpatient Prescriptions  Medication Sig Dispense Refill  . acetaminophen (TYLENOL) 325 MG tablet Take 650 mg by mouth every 6 (six) hours as needed (joint pain).    . ALPRAZolam (XANAX) 0.25 MG tablet Take 1  tablet (0.25 mg total) by mouth at bedtime as needed for anxiety. 30 tablet 5  . amLODipine (NORVASC) 2.5 MG tablet Take 1 tablet (2.5 mg total) by mouth daily. 90 tablet 3  . Ascorbic Acid (VITAMIN C PO) Take 1 tablet by mouth daily. Reported on 08/08/2015    . aspirin 81 MG tablet Take 81 mg by mouth daily.      . B Complex Vitamins (VITAMIN B COMPLEX PO) Take 1 tablet by mouth daily.     . budesonide-formoterol (SYMBICORT) 80-4.5 MCG/ACT inhaler Inhale 1 puff into the lungs 2 (two) times daily. 1 Inhaler 2  . calcium carbonate (OS-CAL) 600 MG tablet Take 600 mg by mouth daily.    . CARTIA XT 120 MG 24 hr capsule TAKE 1 CAPSULE BY MOUTH DAILY 90 capsule 1  . diltiazem (CARDIZEM) 30 MG tablet Take 1-2 tablets by mouth every 6 hours as needed for fast heart rates 30 tablet 1  . docusate sodium (COLACE) 100 MG capsule Take 100 mg by mouth as needed for mild constipation.    . ferrous sulfate 325 (65 FE) MG tablet Take 325 mg by mouth daily.     . furosemide (LASIX) 40 MG tablet TAKE 1 TABLET (40 MG TOTAL) BY MOUTH DAILY. 90 tablet 2  . Glucosamine HCl-MSM (GLUCOSAMINE-MSM PO) Take 2 tablets by mouth daily.     . nitroGLYCERIN (NITROSTAT) 0.4 MG SL tablet Place 1 tablet (0.4 mg total) under the   tongue every 5 (five) minutes as needed for chest pain. 25 tablet 3  . omeprazole (PRILOSEC) 40 MG capsule Take 1 capsule (40 mg total) by mouth daily. 30 capsule 6  . predniSONE (DELTASONE) 10 MG tablet Take 4 tabs daily x 7 days, 3 tabs daily x 7 days, 2 tabs daily 7 x days, 1 tab daily x 7 days then stop. (Patient not taking: Reported on 07/17/2016) 70 tablet 0  . simvastatin (ZOCOR) 10 MG tablet Take 1 tablet (10 mg total) by mouth daily at 6 PM. 90 tablet 3  . triamcinolone cream (KENALOG) 0.1 % Apply 1 application topically 2 (two) times daily. Apply to affected area    . vitamin E 200 UNIT capsule Take 200 Units by mouth daily.     No current facility-administered medications for this visit.      Allergies:   Lisinopril; Pneumovax 23 [pneumococcal vac polyvalent]; and Symbicort [budesonide-formoterol fumarate]   Social History:  The patient  reports that she quit smoking about 22 years ago. Her smoking use included Cigarettes. She started smoking about 57 years ago. She has a 54.00 pack-year smoking history. She has never used smokeless tobacco. She reports that she does not drink alcohol or use drugs.   Family History:  The patient's family history includes Arthritis in her mother; Heart disease in her father; Heart failure in her mother; Hypertension in her father; Kidney failure in her father.    ROS:  Please see the history of present illness.   All other systems are reviewed and negative.    PHYSICAL EXAM: VS:  BP 126/78   Pulse 80   Ht 5' 4" (1.626 m)   Wt 145 lb 12.8 oz (66.1 kg)   LMP  (LMP Unknown)   SpO2 94%   BMI 25.03 kg/m  , BMI Body mass index is 25.03 kg/m. GEN: elderly, in no acute distress  HEENT: normal  Neck: no JVD, carotid bruits, or masses Cardiac: RRR; no murmurs, rubs, or gallops,+1 edema , support hose in place Respiratory:  clear to auscultation bilaterally, normal work of breathing GI: soft, nontender, nondistended, + BS MS: no deformity or atrophy  Skin: warm and dry, leukonychia noted Neuro:  Strength and sensation are intact Psych: euthymic mood, full affect  EKG:  Ordered today reveals accelerated junctional rhythm 80 bpm, inferior infarct pattern, anterolateral infarct pattern   Recent Labs: 02/14/2016: ALT 22; Hemoglobin 14.0; TSH 1.42 07/17/2016: BUN 14; Creatinine, Ser 1.15; Platelets 387; Potassium 4.6; Sodium 136    Lipid Panel     Component Value Date/Time   CHOL 167 05/24/2015 0831   TRIG 71 05/24/2015 0831   HDL 84 05/24/2015 0831   CHOLHDL 2.0 05/24/2015 0831   VLDL 14 05/24/2015 0831   LDLCALC 69 05/24/2015 0831   LDLDIRECT 129.2 05/15/2011 0925     Wt Readings from Last 3 Encounters:  07/17/16 144 lb 12.8 oz  (65.7 kg)  07/17/16 145 lb 12.8 oz (66.1 kg)  05/22/16 143 lb 3.2 oz (65 kg)     ASSESSMENT AND PLAN:  1.  SOB/ chest pain I worry that given the progressive nature of her symptoms that she could have CAD as the cause.  I have reviewed with Dr Skains this am.  We agree that further CV risk stratification is required at this time.  I have therefore advised RHC/LHC with possible PCI.  Risks of procedure discussed at length with patient who wishes to proceed.  We will schedule cath at   next available time. She also requires aggressive management of her active lung disease by pulmonary.  2. SVT Long RP,  Likely atach with first degree AV block.  GIven progressive SOB, will stop amiodarone at this time.  Our options going forward would be tikosyn or ablation.  Given her advanced age, I have tried to avoid ablation.  We will reassess post cath.  3. HTN Stable No change required today bmet  4. Venous insufficiency Support hose  Return to see me in 6 weeks Follow-up with Dr Skains as scheduled  Very complicated situation in an elderly female.  A high level of decision making was require for this encounter including discussion with Dr Skains.  Signed, Marian Meneely, MD      [facility] [address] [place] [address] [phone] (office) [phone] (fax)  

## 2016-07-25 NOTE — Progress Notes (Signed)
Site area:  Right brachial a 6  french venous sheath was removed  Site Prior to Removal:  Level 0  Pressure Applied For 15 MINUTES    Bedrest Beginning 1020am  Manual:   Yes.    Patient Status During Pull:  stable  Post Pull Groin Site:  Level 0  Post Pull Instructions Given:  Yes.    Post Pull Pulses Present:  Yes.    Dressing Applied:  Yes.    Comments:  VS remain stable

## 2016-07-25 NOTE — Interval H&P Note (Signed)
History and Physical Interval Note:  07/25/2016 8:38 AM  Alexandra Reyes  has presented today for cardiac cath with the diagnosis of unstable angina.  The various methods of treatment have been discussed with the patient and family. After consideration of risks, benefits and other options for treatment, the patient has consented to  Procedure(s): Right/Left Heart Cath and Coronary Angiography (N/A) as a surgical intervention .  The patient's history has been reviewed, patient examined, no change in status, stable for surgery.  I have reviewed the patient's chart and labs.  Questions were answered to the patient's satisfaction.    Cath Lab Visit (complete for each Cath Lab visit)  Clinical Evaluation Leading to the Procedure:   ACS: No.  Non-ACS:    Anginal Classification: CCS III  Anti-ischemic medical therapy: Maximal Therapy (2 or more classes of medications)  Non-Invasive Test Results: No non-invasive testing performed  Prior CABG: No previous CABG         Verne Carrowhristopher McAlhany

## 2016-07-25 NOTE — Discharge Instructions (Signed)

## 2016-07-31 DIAGNOSIS — N816 Rectocele: Secondary | ICD-10-CM | POA: Diagnosis not present

## 2016-08-02 DIAGNOSIS — H353211 Exudative age-related macular degeneration, right eye, with active choroidal neovascularization: Secondary | ICD-10-CM | POA: Diagnosis not present

## 2016-08-02 DIAGNOSIS — H35371 Puckering of macula, right eye: Secondary | ICD-10-CM | POA: Diagnosis not present

## 2016-08-02 DIAGNOSIS — H353222 Exudative age-related macular degeneration, left eye, with inactive choroidal neovascularization: Secondary | ICD-10-CM | POA: Diagnosis not present

## 2016-08-02 DIAGNOSIS — H43813 Vitreous degeneration, bilateral: Secondary | ICD-10-CM | POA: Diagnosis not present

## 2016-08-07 ENCOUNTER — Other Ambulatory Visit: Payer: Self-pay | Admitting: Internal Medicine

## 2016-08-07 DIAGNOSIS — F4329 Adjustment disorder with other symptoms: Secondary | ICD-10-CM | POA: Diagnosis not present

## 2016-08-07 NOTE — Telephone Encounter (Signed)
Faxed script back to [facility].../lmb 

## 2016-08-07 NOTE — Telephone Encounter (Signed)
Done hardcopy to Alexandra Reyes  

## 2016-08-14 ENCOUNTER — Ambulatory Visit (INDEPENDENT_AMBULATORY_CARE_PROVIDER_SITE_OTHER): Payer: Medicare Other | Admitting: Pulmonary Disease

## 2016-08-14 ENCOUNTER — Encounter: Payer: Self-pay | Admitting: Pulmonary Disease

## 2016-08-14 VITALS — BP 122/70 | HR 60 | Ht 63.0 in | Wt 142.2 lb

## 2016-08-14 DIAGNOSIS — R0789 Other chest pain: Secondary | ICD-10-CM

## 2016-08-14 DIAGNOSIS — J849 Interstitial pulmonary disease, unspecified: Secondary | ICD-10-CM | POA: Diagnosis not present

## 2016-08-14 DIAGNOSIS — K219 Gastro-esophageal reflux disease without esophagitis: Secondary | ICD-10-CM

## 2016-08-14 MED ORDER — TRAMADOL HCL 50 MG PO TABS: 50.0000 mg | ORAL_TABLET | Freq: Four times a day (QID) | ORAL | 1 refills | Status: DC | PRN

## 2016-08-14 NOTE — Patient Instructions (Signed)
   Call me if you feel your cough is getting worse or you have any new breathing problems.  You can try using Claritin, Allegra, or Zyrtec to help with your sinus allergies. They are all over-the-counter and I recommend taking them at night.  I will see you back in 3 months or sooner if needed. This should allow time for the Amiodarone to get out of your system.

## 2016-08-14 NOTE — Progress Notes (Signed)
Subjective:    Patient ID: Alexandra Reyes, female    DOB: 04/15/1926, 83 y.o.   MRN: [id number]  C.C.:  Follow-up for ILD, Atypical Chest Pain, & GERD.  HPI ILD: Early UIP versus fibrotic NSIP pattern on CT imaging. She reports her dyspnea has been improving prior to her episode of SVT this morning.   Atypical chest pain: Previously responded to prednisone therapy. She continues to have intermittent chest discomfort & pleurisy. Reports she took a previous dose of tramadol at home which seemed to significantly help her pain more so than Tylenol.  GERD: Previously evaluated by GI. Previously taking Tums intermittently. She is continuing to have reflux & dyspepsia intermittently.   Review of Systems She reports she has been tachycardia this morning. She has taken extra Diltiazem this morning. No other chest tightness or pain. No fever or chills. No rashes or bruising.   Allergies  Allergen Reactions  . Lisinopril     Unknown Pt states she's never taken Lisinopril  . Pneumovax 23 [Pneumococcal Vac Polyvalent] Other (See Comments)    Flu like symtpoms for 1 day first time she was given it, but second time there were no symptoms   . Symbicort [Budesonide-Formoterol Fumarate]     Jittery in high doses    Current Outpatient Prescriptions on File Prior to Visit  Medication Sig Dispense Refill  . acetaminophen (TYLENOL) 500 MG tablet Take 1,000 mg by mouth 2 (two) times daily as needed for moderate pain or headache.    . ALPRAZolam (XANAX) 0.25 MG tablet TAKE 1 TABLET BY MOUTH AT BEDTIME AS NEEDED FOR ANXIETY 30 tablet 5  . amLODipine (NORVASC) 2.5 MG tablet Take 1 tablet (2.5 mg total) by mouth daily. 90 tablet 3  . Ascorbic Acid (VITAMIN C PO) Take 1 tablet by mouth daily. Reported on 08/08/2015    . aspirin 81 MG tablet Take 81 mg by mouth at bedtime.     . B Complex Vitamins (VITAMIN B COMPLEX PO) Take 1 tablet by mouth daily.     . budesonide-formoterol (SYMBICORT) 80-4.5 MCG/ACT  inhaler Inhale 1 puff into the lungs 2 (two) times daily. 1 Inhaler 2  . calcium carbonate (OS-CAL) 600 MG tablet Take 600 mg by mouth daily.    Marland Kitchen CARTIA XT 120 MG 24 hr capsule TAKE 1 CAPSULE BY MOUTH DAILY 90 capsule 1  . diltiazem (CARDIZEM) 30 MG tablet Take 1-2 tablets by mouth every 6 hours as needed for fast heart rates (Patient taking differently: Take 30 mgs every 30 minutes for up to 3 doses as needed for tachycardia) 30 tablet 1  . docusate sodium (COLACE) 100 MG capsule Take 100 mg by mouth as needed for mild constipation.    . ferrous sulfate 325 (65 FE) MG tablet Take 325 mg by mouth daily.     . furosemide (LASIX) 40 MG tablet TAKE 1 TABLET (40 MG TOTAL) BY MOUTH DAILY. 90 tablet 2  . Glucosamine HCl-MSM (GLUCOSAMINE-MSM PO) Take 2 tablets by mouth daily.     . nitroGLYCERIN (NITROSTAT) 0.4 MG SL tablet Place 1 tablet (0.4 mg total) under the tongue every 5 (five) minutes as needed for chest pain. 25 tablet 3  . omeprazole (PRILOSEC) 40 MG capsule Take 1 capsule (40 mg total) by mouth daily. 30 capsule 6  . prednisoLONE acetate (PRED FORTE) 1 % ophthalmic suspension Place 1 drop into the right eye 4 (four) times daily. Starting the day before an eye injection, continue 4 days  after injection    . PRESCRIPTION MEDICATION Apply 1 Dose to eye every 3 (three) months. Eylea - eye injection    . simvastatin (ZOCOR) 10 MG tablet Take 1 tablet (10 mg total) by mouth daily at 6 PM. 90 tablet 3  . tobramycin (TOBREX) 0.3 % ophthalmic solution Place 1 drop into the right eye 4 (four) times daily. Use the day before, the day of, and the day after eye injections    . triamcinolone cream (KENALOG) 0.1 % Apply 1 application topically daily as needed (hives). Apply to affected area     . vitamin E 200 UNIT capsule Take 200 Units by mouth daily.    . calcium carbonate (TUMS - DOSED IN MG ELEMENTAL CALCIUM) 500 MG chewable tablet Chew 3 tablets by mouth daily as needed for indigestion or heartburn.      . predniSONE (DELTASONE) 10 MG tablet Take 4 tabs daily x 7 days, 3 tabs daily x 7 days, 2 tabs daily 7 x days, 1 tab daily x 7 days then stop. (Patient not taking: Reported on 08/14/2016) 70 tablet 0   No current facility-administered medications on file prior to visit.     Past Medical History:  Diagnosis Date  . Alcohol dependence in remission ([facility]) 05/22/2011  . Allergic rhinitis, cause unspecified 05/22/2011  . Allergy   . Anemia, iron deficiency 05/18/2011  . Cholelithiasis   . COPD (chronic obstructive pulmonary disease) ([place])   . DDD (degenerative disc disease), lumbar 05/18/2011  . First degree AV block   . GERD (gastroesophageal reflux disease)   . Hiatal hernia   . HTN (hypertension) 05/18/2011  . Hyperlipidemia 05/18/2011  . Macular degeneration   . Myocardial infarction   . OA (osteoarthritis)   . Osteoporosis 05/18/2011  . PAT (paroxysmal atrial tachycardia) ([place])   . Pectus excavatum 05/18/2011  . Personal history of colonic polyps 10/28/2012  . SVT (supraventricular tachycardia) (HCC)     Past Surgical History:  Procedure Laterality Date  . ABDOMINAL HYSTERECTOMY  1988  . BREAST BIOPSY  1948  . BREAST LUMPECTOMY    . CARDIOVASCULAR STRESS TEST  03/08/2009   EF 84%  . RIGHT/LEFT HEART CATH AND CORONARY ANGIOGRAPHY N/A 07/25/2016   Procedure: Right/Left Heart Cath and Coronary Angiography;  Surgeon: Burnell Blanks, MD;  Location: [facility];  Service: Cardiovascular;  Laterality: N/A;  . TONSILLECTOMY AND ADENOIDECTOMY    . US ECHOCARDIOGRAPHY  01/17/2003   EF 55-60%    Family History  Problem Relation Age of Onset  . Heart failure Mother   . Arthritis Mother   . Kidney failure Father   . Heart disease Father   . Hypertension Father     Social History   Social History  . Marital status: Widowed    Spouse name: N/A  . Number of children: Y  . Years of education: 42   Occupational History  . Retired Equities trader    Social History  Main Topics  . Smoking status: Former Smoker    Packs/day: 1.50    Years: 36.00    Types: Cigarettes    Start date: 03/01/1959    Quit date: 06/10/1994  . Smokeless tobacco: Never Used     Comment: smoked 1- 1.5 ppd.   . Alcohol use No  . Drug use: No  . Sexual activity: Not Asked   Other Topics Concern  . None   Social History Narrative   Originally from [place], [place]. She moved  to [place] in 1950 during the polio epidemic. Previously worked as a nurse. No pets currently. Remote bird exposure. No mold or hot tub exposure.       Objective:   Physical Exam BP 122/70 (BP Location: Right Arm, Patient Position: Sitting, Cuff Size: Normal)   Pulse 60   Ht 5' 3" (1.6 m)   Wt 142 lb 3.2 oz (64.5 kg)   LMP  (LMP Unknown)   SpO2 97%   BMI 25.19 kg/m   General:  No distress. Alert. Comfortable. Integument:  Warm & dry. No rash or bruising on exposed skin. HEENT:  No scleral icterus. No oral ulcers. Pulmonary:  Clear with auscultation. No accessory muscle use. Cardiovascular:  No murmur or rub. Regular rate & rhythm. Abdomen:  Soft. Normal bowel sounds. No tenderness.   PFT 07/12/15: FVC 2.29 L (106%) FEV1 1.57 L (99%) FEV1/FVC 0.68 FEF 25-75 0.78 L (81%) no bronchodilator response TLC 3.59 L (73%) RV 52% ERV 342% DLCO uncorrected 52% 05/14/13: FVC 2.34 L (103%) FEV1 1.63 L (97%) FEV1/FVC 0.69 FEF 25-75 0.83 L (76%) no bronchodilator response TLC 4.51 L (92%) ERV 284% DLCO uncorrected 55%  IMAGING CT CHEST W/O 04/03/16 (previously reviewed by me):  No pleural effusion or thickening. No pathologic mediastinal adenopathy. No pericardial effusion. Basilar predominant subpleural jugular changes that are mild and more pronounced on CT imaging.  CT CHEST W/O 10/09/15 (previously reviewed by me):  No pneumothorax or effusion. Minimal posterior subsegmental atelectasis. No appreciable bronchiectasis, mass, or nodule. No pathologic mediastinal adenopathy. No pericardial effusion. Mild coronary artery  calcification.  BARIUM SWALLOW (08/11/15): No evidence for stricture or mass lesion. Moderate esophageal dysmotility without hiatal hernia. Small amount spontaneous reflux into the lower third of the esophagus.  CTA CHEST 08/03/15 (previously reviewed by me): Mild lower lobe predominant atelectasis. No other nodule or opacity appreciated. No ulnar emboli. No pleural effusion or thickening. No pericardial effusion. No pathologic mediastinal adenopathy.  CARDIAC LHC (07/25/16):  Prox RCA lesion, 100 %stenosed.  Prox LAD lesion, 20 %stenosed.  LV end diastolic pressure is normal.   1. 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 The mid and distal vessel fills briskly from left to right collaterals.  2. Mild non-obstructive disease in the LAD 3. Normal filling pressures.  4. N specific ormal PA pressures  TTE (07/07/15): Narrow LVOT with normal cavity size. Mild LVH. EF 55-60%. Grade 1 diastolic dysfunction. LA & RA normal in size. RV normal in size and function. No aortic regurgitation. No mitral stenosis or regurgitation. No pulmonic regurgitation. Mild tricuspid regurgitation. No pericardial effusion.  LABS 04/17/16 CRP:  0.2 ESR:  19 ANA:  Negative Anti-CCP:  <16 RF:  <14 SCL-70:  <1.0 Hypersensitivity Pneumonitis Panel:  Negative   08/02/15 CBC: 9.2/14.4/42.7/390 BMP: 133/4.3/98/28/17/1.09/94/9.5 LFT: 4.2/7.0/0.5/62/18/16    Assessment & Plan:  83 y.o. female with ILD, atypical chest pain, & GERD. Patient's interstitial lung disease is likely secondary to her amiodarone although did not have typical pattern. Alternatively, this could be a possibility autoimmune process as well.  I'm holding off on further autoimmune testing and immunosuppression at this time. I am prescribing Ultram to help with her atypical chest pain. I instructed the patient contact my office if she had any new breathing problems before next appointment.   1. ILD: Continuing to monitor symptoms. Holding off on  repeat pulmonary function testing at this time. 2. Atypical chest pain: Holding off on prednisone therapy. Prescribed Ultram/tramadol to use as needed.  3. GERD:    Continuing Tums intermittently.  4. Health maintenance: Status post influenza vaccine November 2017, Prevnar January 2015, & Pneumovax January 1998. 5. Follow-up: Return to clinic in 3 months or sooner if needed.  Sonia Baller Ashok Cordia, M.D. [facility] Pager:  [phone] chest  After 3pm or if no response, call [phone] 12:43 PM 08/14/16   I'm holding off on further review his

## 2016-08-15 ENCOUNTER — Telehealth: Payer: Self-pay | Admitting: Internal Medicine

## 2016-08-15 NOTE — Telephone Encounter (Signed)
Spoke with patient. She stated that she is interested in scheduling awv. She would like a call back in the fall of 2018 to schedule appt.

## 2016-08-20 ENCOUNTER — Other Ambulatory Visit: Payer: Self-pay | Admitting: Internal Medicine

## 2016-08-23 DIAGNOSIS — N816 Rectocele: Secondary | ICD-10-CM | POA: Diagnosis not present

## 2016-08-27 ENCOUNTER — Other Ambulatory Visit: Payer: Self-pay | Admitting: Internal Medicine

## 2016-08-30 ENCOUNTER — Ambulatory Visit (INDEPENDENT_AMBULATORY_CARE_PROVIDER_SITE_OTHER): Payer: Medicare Other | Admitting: Internal Medicine

## 2016-08-30 ENCOUNTER — Encounter: Payer: Self-pay | Admitting: *Deleted

## 2016-08-30 ENCOUNTER — Encounter: Payer: Self-pay | Admitting: Internal Medicine

## 2016-08-30 VITALS — BP 130/80 | HR 81 | Ht 63.0 in | Wt 142.0 lb

## 2016-08-30 DIAGNOSIS — I471 Supraventricular tachycardia: Secondary | ICD-10-CM | POA: Diagnosis not present

## 2016-08-30 DIAGNOSIS — I10 Essential (primary) hypertension: Secondary | ICD-10-CM

## 2016-08-30 DIAGNOSIS — R0602 Shortness of breath: Secondary | ICD-10-CM

## 2016-08-30 NOTE — Patient Instructions (Signed)
Medication Instructions:    Your physician recommends that you continue on your current medications as directed. Please refer to the Current Medication list given to you today.  --- If you need a refill on your cardiac medications before your next appointment, please call your pharmacy. ---  Labwork:  None ordered  Testing/Procedures: Your physician has recommended that you have an ablation. Catheter ablation is a medical procedure used to treat some cardiac arrhythmias (irregular heartbeats). During catheter ablation, a long, thin, flexible tube is put into a blood vessel in your groin (upper thigh), or neck. This tube is called an ablation catheter. It is then guided to your heart through the blood vessel. Radio frequency waves destroy small areas of heart tissue where abnormal heartbeats may cause an arrhythmia to start. Please see the instruction sheet given to you today.  Follow-Up:  Your physician recommends that you schedule a follow-up appointment in: 4 weeks, after your procedure on 09/19/16, with Dr. [name].  Thank you for choosing [facility]!!    Any Other Special Instructions Will Be Listed Below (If Applicable).  Cardiac Ablation Cardiac ablation is a procedure to disable (ablate) a small amount of heart tissue in very specific places. The heart has many electrical connections. Sometimes these connections are abnormal and can cause the heart to beat very fast or irregularly. Ablating some of the problem areas can improve the heart rhythm or return it to normal. Ablation may be done for people who:  Have Wolff-Parkinson-White syndrome.  Have fast heart rhythms (tachycardia).  Have taken medicines for an abnormal heart rhythm (arrhythmia) that were not effective or caused side effects.  Have a high-risk heartbeat that may be life-threatening. During the procedure, a small incision is made in the neck or the groin, and a long, thin, flexible tube (catheter) is inserted  into the incision and moved to the heart. Small devices (electrodes) on the tip of the catheter will send out electrical currents. A type of X-ray (fluoroscopy) will be used to help guide the catheter and to provide images of the heart. Tell a health care provider about:  Any allergies you have.  All medicines you are taking, including vitamins, herbs, eye drops, creams, and over-the-counter medicines.  Any problems you or family members have had with anesthetic medicines.  Any blood disorders you have.  Any surgeries you have had.  Any medical conditions you have, such as kidney failure.  Whether you are pregnant or may be pregnant. What are the risks? Generally, this is a safe procedure. However, problems may occur, including:  Infection.  Bruising and bleeding at the catheter insertion site.  Bleeding into the chest, especially into the sac that surrounds the heart. This is a serious complication.  Stroke or blood clots.  Damage to other structures or organs.  Allergic reaction to medicines or dyes.  Need for a permanent pacemaker if the normal electrical system is damaged. A pacemaker is a small computer that sends electrical signals to the heart and helps your heart beat normally.  The procedure not being fully effective. This may not be recognized until months later. Repeat ablation procedures are sometimes required. What happens before the procedure?  Follow instructions from your health care provider about eating or drinking restrictions.  Ask your health care provider about:  Changing or stopping your regular medicines. This is especially important if you are taking diabetes medicines or blood thinners.  Taking medicines such as aspirin and ibuprofen. These medicines can thin your blood.  Do not take these medicines before your procedure if your health care provider instructs you not to.  Plan to have someone take you home from the hospital or clinic.  If you  will be going home right after the procedure, plan to have someone with you for 24 hours. What happens during the procedure?  To lower your risk of infection:  Your health care team will wash or sanitize their hands.  Your skin will be washed with soap.  Hair may be removed from the incision area.  An IV tube will be inserted into one of your veins.  You will be given a medicine to help you relax (sedative).  The skin on your neck or groin will be numbed.  An incision will be made in your neck or your groin.  A needle will be inserted through the incision and into a large vein in your neck or groin.  A catheter will be inserted into the needle and moved to your heart.  Dye may be injected through the catheter to help your surgeon see the area of the heart that needs treatment.  Electrical currents will be sent from the catheter to ablate heart tissue in desired areas. There are three types of energy that may be used to ablate heart tissue:  Heat (radiofrequency energy).  Laser energy.  Extreme cold (cryoablation).  When the necessary tissue has been ablated, the catheter will be removed.  Pressure will be held on the catheter insertion area to prevent excessive bleeding.  A bandage (dressing) will be placed over the catheter insertion area. The procedure may vary among health care providers and hospitals. What happens after the procedure?  Your blood pressure, heart rate, breathing rate, and blood oxygen level will be monitored until the medicines you were given have worn off.  Your catheter insertion area will be monitored for bleeding. You will need to lie still for a few hours to ensure that you do not bleed from the catheter insertion area.  Do not drive for 24 hours or as long as directed by your health care provider. Summary  Cardiac ablation is a procedure to disable (ablate) a small amount of heart tissue in very specific places. Ablating some of the problem  areas can improve the heart rhythm or return it to normal.  During the procedure, electrical currents will be sent from the catheter to ablate heart tissue in desired areas. This information is not intended to replace advice given to you by your health care provider. Make sure you discuss any questions you have with your health care provider. Document Released: 10/13/2008 Document Revised: 04/15/2016 Document Reviewed: 04/15/2016 Elsevier Interactive Patient Education  2017 [employer].

## 2016-08-30 NOTE — Progress Notes (Signed)
 Electrophysiology Office Note  Date:  08/30/2016   ID:  Alexandra Reyes, DOB 08/14/1925, MRN [id number]  PCP:  Alcides Nutting [name], MD  Cardiologist:  Dr Skains, (previously Brackbill) Primary Electrophysiologist: Adith Tejada, MD    Chief Complaint  Patient presents with  . Chest pain, unspecified type  . Shortness of Breath    on excertion     History of Present Illness: Alexandra Reyes is a 83 y.o. female who presents today for electrophysiology evaluation.  She is pleased that her SOB is much improved since stopping amiodarone.  Unfortunately, her SVT has significantly increased.   CP is improved.  Cath is reviewed with her at length today.  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  No intervention is planned for this.    Past Medical History:  Diagnosis Date  . Alcohol dependence in remission (HCC) 05/22/2011  . Allergic rhinitis, cause unspecified 05/22/2011  . Allergy   . Anemia, iron deficiency 05/18/2011  . Cholelithiasis   . COPD (chronic obstructive pulmonary disease) (HCC)   . DDD (degenerative disc disease), lumbar 05/18/2011  . First degree AV block   . GERD (gastroesophageal reflux disease)   . Hiatal hernia   . HTN (hypertension) 05/18/2011  . Hyperlipidemia 05/18/2011  . Macular degeneration   . Myocardial infarction   . OA (osteoarthritis)   . Osteoporosis 05/18/2011  . PAT (paroxysmal atrial tachycardia) (HCC)   . Pectus excavatum 05/18/2011  . Personal history of colonic polyps 10/28/2012  . SVT (supraventricular tachycardia) (HCC)    Past Surgical History:  Procedure Laterality Date  . ABDOMINAL HYSTERECTOMY  1988  . BREAST BIOPSY  1948  . BREAST LUMPECTOMY    . CARDIOVASCULAR STRESS TEST  03/08/2009   EF 84%  . RIGHT/LEFT HEART CATH AND CORONARY ANGIOGRAPHY N/A 07/25/2016   Procedure: Right/Left Heart Cath and Coronary Angiography;  Surgeon: Christopher D McAlhany, MD;  Location: [facility] INVASIVE CV LAB;  Service: Cardiovascular;  Laterality:  N/A;  . TONSILLECTOMY AND ADENOIDECTOMY    . US ECHOCARDIOGRAPHY  01/17/2003   EF 55-60%     Current Outpatient Prescriptions  Medication Sig Dispense Refill  . acetaminophen (TYLENOL) 500 MG tablet Take 1,000 mg by mouth 2 (two) times daily as needed for moderate pain or headache.    . ALPRAZolam (XANAX) 0.25 MG tablet TAKE 1 TABLET BY MOUTH AT BEDTIME AS NEEDED FOR ANXIETY 30 tablet 5  . amLODipine (NORVASC) 2.5 MG tablet Take 1 tablet (2.5 mg total) by mouth daily. 90 tablet 3  . Ascorbic Acid (VITAMIN C PO) Take 1 tablet by mouth daily. Reported on 08/08/2015    . aspirin 81 MG tablet Take 81 mg by mouth at bedtime.     . B Complex Vitamins (VITAMIN B COMPLEX PO) Take 1 tablet by mouth daily.     . budesonide-formoterol (SYMBICORT) 80-4.5 MCG/ACT inhaler Inhale 1 puff into the lungs 2 (two) times daily. 1 Inhaler 2  . calcium carbonate (OS-CAL) 600 MG tablet Take 600 mg by mouth daily.    . calcium carbonate (TUMS - DOSED IN MG ELEMENTAL CALCIUM) 500 MG chewable tablet Chew 3 tablets by mouth daily as needed for indigestion or heartburn.    . CARTIA XT 120 MG 24 hr capsule TAKE 1 CAPSULE(S) BY MOUTH DAILY 90 capsule 1  . docusate sodium (COLACE) 100 MG capsule Take 100 mg by mouth as needed for mild constipation (Take as directed).     . ferrous sulfate 325 (65   FE) MG tablet Take 325 mg by mouth daily.     . furosemide (LASIX) 40 MG tablet TAKE 1 TABLET (40 MG TOTAL) BY MOUTH DAILY. 90 tablet 2  . Glucosamine HCl-MSM (GLUCOSAMINE-MSM PO) Take 2 tablets by mouth daily.     . nitroGLYCERIN (NITROSTAT) 0.4 MG SL tablet Place 1 tablet (0.4 mg total) under the tongue every 5 (five) minutes as needed for chest pain. 25 tablet 3  . omeprazole (PRILOSEC) 40 MG capsule TAKE ONE CAPSULE BY MOUTH DAILY 30 capsule 3  . prednisoLONE acetate (PRED FORTE) 1 % ophthalmic suspension Place 1 drop into the right eye 4 (four) times daily. Starting the day before an eye injection, continue 4 days after  injection    . PRESCRIPTION MEDICATION Apply 1 Dose to eye every 3 (three) months. Eylea - eye injection    . simvastatin (ZOCOR) 10 MG tablet Take 1 tablet (10 mg total) by mouth daily at 6 PM. 90 tablet 3  . tobramycin (TOBREX) 0.3 % ophthalmic solution Place 1 drop into the right eye 4 (four) times daily. Use the day before, the day of, and the day after eye injections    . traMADol (ULTRAM) 50 MG tablet Take 1 tablet (50 mg total) by mouth every 6 (six) hours as needed for moderate pain. 90 tablet 1  . triamcinolone cream (KENALOG) 0.1 % Apply 1 application topically daily as needed (hives). Apply to affected area     . vitamin E 200 UNIT capsule Take 200 Units by mouth daily.     No current facility-administered medications for this visit.     Allergies:   Lisinopril; Pneumovax 23 [pneumococcal vac polyvalent]; and Symbicort [budesonide-formoterol fumarate]   Social History:  The patient  reports that she quit smoking about 22 years ago. Her smoking use included Cigarettes. She started smoking about 57 years ago. She has a 54.00 pack-year smoking history. She has never used smokeless tobacco. She reports that she does not drink alcohol or use drugs.   Family History:  The patient's family history includes Arthritis in her mother; Heart disease in her father; Heart failure in her mother; Hypertension in her father; Kidney failure in her father.    ROS:  Please see the history of present illness.   All other systems are reviewed and negative.    PHYSICAL EXAM: VS:  BP 130/80   Pulse 81   Ht 5' 3" (1.6 m)   Wt 142 lb (64.4 kg)   LMP  (LMP Unknown)   SpO2 97%   BMI 25.15 kg/m  , BMI Body mass index is 25.15 kg/m. GEN: elderly, in no acute distress  HEENT: normal  Neck: no JVD, carotid bruits, or masses Cardiac: RRR; no murmurs, rubs, or gallops,+1 edema , support hose in place Respiratory:  clear to auscultation bilaterally, normal work of breathing GI: soft, nontender,  nondistended, + BS MS: no deformity or atrophy  Skin: warm and dry, leukonychia noted Neuro:  Strength and sensation are intact Psych: euthymic mood, full affect  EKG:  Ordered today sinus rhythm 73 bpm, PR 236 msec, LVH   Recent Labs: 02/14/2016: ALT 22; Hemoglobin 14.0; TSH 1.42 07/17/2016: BUN 14; Creatinine, Ser 1.15; Platelets 387; Potassium 4.6; Sodium 136    Lipid Panel     Component Value Date/Time   CHOL 167 05/24/2015 0831   TRIG 71 05/24/2015 0831   HDL 84 05/24/2015 0831   CHOLHDL 2.0 05/24/2015 0831   VLDL 14 05/24/2015 0831     LDLCALC 69 05/24/2015 0831   LDLDIRECT 129.2 05/15/2011 0925     Wt Readings from Last 3 Encounters:  08/30/16 142 lb (64.4 kg)  08/14/16 142 lb 3.2 oz (64.5 kg)  07/25/16 144 lb (65.3 kg)     ASSESSMENT AND PLAN:  1.  SVT The patient has symptomatic SVT.  I suspect that this long RP tachycardia is atach with first degree AV block though I cannot exclude a reentrant arrhythmia. Therapeutic strategies for supraventricular tachycardia including medicine and ablation were discussed in detail with the patient today. Risk, benefits, and alternatives to EP study and radiofrequency ablation were also discussed in detail today. These risks include but are not limited to stroke, bleeding, vascular damage, tamponade, perforation, damage to the heart and other structures, AV block requiring pacemaker, worsening renal function, and death. The patient understands these risk and wishes to proceed.  We will therefore proceed with catheter ablation at the next available time.  Carto and anesthesia are requested for this procedure.  2. SOB  Much improved off of amiodarone She is followed by pulmonary  3. HTN Stable No change required today  4. Venous insufficiency Support hose  Signed, Mazey Mantell, MD      [facility] [address] [place] [address] [phone] (office) [phone] (fax)  

## 2016-09-11 ENCOUNTER — Ambulatory Visit (INDEPENDENT_AMBULATORY_CARE_PROVIDER_SITE_OTHER): Payer: Medicare Other | Admitting: Internal Medicine

## 2016-09-11 ENCOUNTER — Encounter: Payer: Self-pay | Admitting: Internal Medicine

## 2016-09-11 VITALS — BP 152/78 | HR 64 | Temp 97.8°F | Ht 63.5 in | Wt 143.0 lb

## 2016-09-11 DIAGNOSIS — I519 Heart disease, unspecified: Secondary | ICD-10-CM | POA: Diagnosis not present

## 2016-09-11 DIAGNOSIS — I10 Essential (primary) hypertension: Secondary | ICD-10-CM | POA: Diagnosis not present

## 2016-09-11 DIAGNOSIS — R040 Epistaxis: Secondary | ICD-10-CM | POA: Diagnosis not present

## 2016-09-11 DIAGNOSIS — I5189 Other ill-defined heart diseases: Secondary | ICD-10-CM

## 2016-09-11 NOTE — Progress Notes (Signed)
Subjective:    Patient ID: Alexandra Reyes, female    DOB: [birth year], 83 y.o.   MRN: [id number]  HPI  Here to f/u; overall doing ok,  Pt denies  increasing sob or doe, wheezing, orthopnea, PND, increased LE swelling, palpitations, dizziness or syncope, but does have a chronic persistent anterior CP in last months. Has tramadol for pain., and takes tylenol prn as well. .    Pt denies new neurological symptoms such as new headache, or facial or extremity weakness or numbness.  Pt denies polydipsia, polyuria, or low sugar episode.   Pt denies new neurological symptoms such as new headache, or facial or extremity weakness or numbness.   Pt states overall good compliance with meds, mostly trying to follow appropriate diet, with wt overall stable,  but little exercise however.  Now sched for next wk apr 12 for ablation.  Also s/p recent steroid tx after abnormal CT chest/ILD.  Now off the amiodarone x 2 mo.  Now getting left nosebleed once weekly on asa only, but declines ENT referral as seems minor to her now Past Medical History:  Diagnosis Date  . Alcohol dependence in remission (HCC) 05/22/2011  . Allergic rhinitis, cause unspecified 05/22/2011  . Allergy   . Anemia, iron deficiency 05/18/2011  . Cholelithiasis   . COPD (chronic obstructive pulmonary disease) (HCC)   . DDD (degenerative disc disease), lumbar 05/18/2011  . First degree AV block   . GERD (gastroesophageal reflux disease)   . Hiatal hernia   . HTN (hypertension) 05/18/2011  . Hyperlipidemia 05/18/2011  . Macular degeneration   . Myocardial infarction   . OA (osteoarthritis)   . Osteoporosis 05/18/2011  . PAT (paroxysmal atrial tachycardia) (HCC)   . Pectus excavatum 05/18/2011  . Personal history of colonic polyps 10/28/2012  . SVT (supraventricular tachycardia) (HCC)    Past Surgical History:  Procedure Laterality Date  . ABDOMINAL HYSTERECTOMY  1988  . BREAST BIOPSY  1948  . BREAST LUMPECTOMY    . CARDIOVASCULAR STRESS  TEST  03/08/2009   EF 84%  . RIGHT/LEFT HEART CATH AND CORONARY ANGIOGRAPHY N/A 07/25/2016   Procedure: Right/Left Heart Cath and Coronary Angiography;  Surgeon: Kathleene Hazel, MD;  Location: [facility] INVASIVE CV LAB;  Service: Cardiovascular;  Laterality: N/A;  . TONSILLECTOMY AND ADENOIDECTOMY    . US ECHOCARDIOGRAPHY  01/17/2003   EF 55-60%    reports that she quit smoking about 22 years ago. Her smoking use included Cigarettes. She started smoking about 57 years ago. She has a 54.00 pack-year smoking history. She has never used smokeless tobacco. She reports that she does not drink alcohol or use drugs. family history includes Arthritis in her mother; Heart disease in her father; Heart failure in her mother; Hypertension in her father; Kidney failure in her father. Allergies  Allergen Reactions  . Lisinopril     Unknown Pt states she's never taken Lisinopril  . Pneumovax 23 [Pneumococcal Vac Polyvalent] Other (See Comments)    Flu like symtpoms for 1 day first time she was given it, but second time there were no symptoms   . Symbicort [Budesonide-Formoterol Fumarate]     Jittery in high doses   Current Outpatient Prescriptions on File Prior to Visit  Medication Sig Dispense Refill  . acetaminophen (TYLENOL) 500 MG tablet Take 1,000 mg by mouth 2 (two) times daily as needed for moderate pain or headache.    . ALPRAZolam (XANAX) 0.25 MG tablet TAKE 1 TABLET BY MOUTH AT  BEDTIME AS NEEDED FOR ANXIETY 30 tablet 5  . amLODipine (NORVASC) 2.5 MG tablet Take 1 tablet (2.5 mg total) by mouth daily. 90 tablet 3  . Ascorbic Acid (VITAMIN C PO) Take 1 tablet by mouth daily. Reported on 08/08/2015    . aspirin 81 MG tablet Take 81 mg by mouth at bedtime.     . B Complex Vitamins (VITAMIN B COMPLEX PO) Take 1 tablet by mouth daily.     . budesonide-formoterol (SYMBICORT) 80-4.5 MCG/ACT inhaler Inhale 1 puff into the lungs 2 (two) times daily. 1 Inhaler 2  . calcium carbonate (OS-CAL) 600 MG tablet  Take 600 mg by mouth daily.    . calcium carbonate (TUMS - DOSED IN MG ELEMENTAL CALCIUM) 500 MG chewable tablet Chew 3 tablets by mouth daily as needed for indigestion or heartburn.    Marland Kitchen CARTIA XT 120 MG 24 hr capsule TAKE 1 CAPSULE(S) BY MOUTH DAILY 90 capsule 1  . docusate sodium (COLACE) 100 MG capsule Take 100 mg by mouth as needed for mild constipation (Take as directed).     . ferrous sulfate 325 (65 FE) MG tablet Take 325 mg by mouth daily.     . furosemide (LASIX) 40 MG tablet TAKE 1 TABLET (40 MG TOTAL) BY MOUTH DAILY. 90 tablet 2  . Glucosamine HCl-MSM (GLUCOSAMINE-MSM PO) Take 2 tablets by mouth daily.     . nitroGLYCERIN (NITROSTAT) 0.4 MG SL tablet Place 1 tablet (0.4 mg total) under the tongue every 5 (five) minutes as needed for chest pain. 25 tablet 3  . omeprazole (PRILOSEC) 40 MG capsule TAKE ONE CAPSULE BY MOUTH DAILY 30 capsule 3  . prednisoLONE acetate (PRED FORTE) 1 % ophthalmic suspension Place 1 drop into the right eye 4 (four) times daily. Starting the day before an eye injection, continue 4 days after injection    . PRESCRIPTION MEDICATION Apply 1 Dose to eye every 3 (three) months. Eylea - eye injection    . simvastatin (ZOCOR) 10 MG tablet Take 1 tablet (10 mg total) by mouth daily at 6 PM. 90 tablet 3  . tobramycin (TOBREX) 0.3 % ophthalmic solution Place 1 drop into the right eye 4 (four) times daily. Use the day before, the day of, and the day after eye injections    . traMADol (ULTRAM) 50 MG tablet Take 1 tablet (50 mg total) by mouth every 6 (six) hours as needed for moderate pain. 90 tablet 1  . triamcinolone cream (KENALOG) 0.1 % Apply 1 application topically daily as needed (hives). Apply to affected area     . vitamin E 200 UNIT capsule Take 200 Units by mouth daily.     No current facility-administered medications on file prior to visit.    Review of Systems Constitutional: Negative for other unusual diaphoresis or sweats HENT: Negative for ear discharge or  swelling Eyes: Negative for other worsening visual disturbances Respiratory: Negative for stridor or other swelling  Gastrointestinal: Negative for worsening distension or other blood Genitourinary: Negative for retention or other urinary change Musculoskeletal: Negative for other MSK pain or swelling Skin: Negative for color change or other new lesions Neurological: Negative for worsening tremors and other numbness  Psychiatric/Behavioral: Negative for worsening agitation or other fatigue All other system neg per pt    Objective:   Physical Exam BP (!) 152/78   Pulse 64   Temp 97.8 F (36.6 C) (Oral)   Ht 5' 3.5" (1.613 m)   Wt 143 lb (64.9 kg)  LMP  (LMP Unknown)   SpO2 98%   BMI 24.93 kg/m  VS noted,  Constitutional: Pt appears in NAD HENT: Head: NCAT.  Right Ear: External ear normal.  Left Ear: External ear normal.  Eyes: . Pupils are equal, round, and reactive to light. Conjunctivae and EOM are normal Nose: without d/c or deformity Neck: Neck supple. Gross normal ROM Cardiovascular: Normal rate and regular rhythm.   Pulmonary/Chest: Effort normal and breath sounds without rales or wheezing.  Abd:  Soft, NT, ND, + BS, no organomegaly Neurological: Pt is alert. At baseline orientation, motor grossly intact Skin: Skin is warm. No rashes, other new lesions, no LE edema Psychiatric: Pt behavior is normal without agitation  No other exam findings  Lab Results  Component Value Date   WBC 10.6 07/17/2016   HGB 14.0 02/14/2016   HCT 41.7 07/17/2016   PLT 387 (H) 07/17/2016   GLUCOSE 90 07/17/2016   CHOL 167 05/24/2015   TRIG 71 05/24/2015   HDL 84 05/24/2015   LDLDIRECT 129.2 05/15/2011   LDLCALC 69 05/24/2015   ALT 22 02/14/2016   AST 21 02/14/2016   NA 136 07/17/2016   K 4.6 07/17/2016   CL 96 07/17/2016   CREATININE 1.15 (H) 07/17/2016   BUN 14 07/17/2016   CO2 20 07/17/2016   TSH 1.42 02/14/2016   INR 1.0 07/17/2016   HGBA1C  07/13/2007    5.8 (NOTE)    The ADA recommends the following therapeutic goals for glycemic   control related to Hgb A1C measurement:   Goal of Therapy:   < 7.0% Hgb A1C   Action Suggested:  > 8.0% Hgb A1C   Ref:  Diabetes Care, 22, Suppl. 1, 1999      Assessment & Plan:

## 2016-09-11 NOTE — Assessment & Plan Note (Signed)
Mild once weekly, no obvious lesion or swelling or bleeding today, pt declines ENT for now, due to upcoming ablation

## 2016-09-11 NOTE — Patient Instructions (Signed)
Please continue all other medications as before, and refills have been done if requested.  Please have the pharmacy call with any other refills you may need.  Please continue your efforts at being more active, low cholesterol diet, and weight control  Please keep your appointments with your specialists as you may have planned  Please call if you change your mind about the referral to ENT  Please return in 6 months, or sooner if needed

## 2016-09-11 NOTE — Progress Notes (Signed)
Pre visit review using our clinic review tool, if applicable. No additional management support is needed unless otherwise documented below in the visit note. 

## 2016-09-12 ENCOUNTER — Encounter: Payer: Self-pay | Admitting: Cardiology

## 2016-09-13 ENCOUNTER — Telehealth: Payer: Self-pay | Admitting: Internal Medicine

## 2016-09-13 NOTE — Telephone Encounter (Signed)
Reviewed instruction letter that was provided to patient.  She is not instructed to hold aspirin.  She takes one every night at bedtime.  She will take as usual.  She is aware to hold Cartia for 48 hr prior and has concerns about having tachycardia symptoms.  Advised her to call the office if she develops symptoms, depending on when it happens she might be advised to take the short acting diltiazem.  Also advised that as part of protocol a urinary catheter is not normally placed, however she can discuss when she arrives to Short Stay.  Pt is appreciative for the information provided.

## 2016-09-13 NOTE — Telephone Encounter (Signed)
New message     Pt wants to know if she needs to stop the aspirin for her ablation and do they put a catheter in for urination?

## 2016-09-16 NOTE — Assessment & Plan Note (Signed)
>>  ASSESSMENT AND PLAN FOR HTN (HYPERTENSION) WRITTEN ON 09/16/2016  4:53 AM BY Corwin Levins, MD  Mild elevated today, asympt, o/2 stable overall by history and exam, recent data reviewed with pt, dedlies change in tx, and pt to continue medical treatment as before,  to f/u any worsening symptoms or concerns BP Readings from Last 3 Encounters:  09/11/16 (!) 152/78  08/30/16 130/80  08/14/16 122/70

## 2016-09-16 NOTE — Assessment & Plan Note (Signed)
Mild elevated today, asympt, o/2 stable overall by history and exam, recent data reviewed with pt, dedlies change in tx, and pt to continue medical treatment as before,  to f/u any worsening symptoms or concerns BP Readings from Last 3 Encounters:  09/11/16 (!) 152/78  08/30/16 130/80  08/14/16 122/70

## 2016-09-16 NOTE — Assessment & Plan Note (Signed)
stable overall by history and exam, recent data reviewed with pt, and pt to continue medical treatment as before,  to f/u any worsening symptoms or concerns  

## 2016-09-19 ENCOUNTER — Ambulatory Visit (HOSPITAL_COMMUNITY): Payer: Medicare Other | Admitting: Anesthesiology

## 2016-09-19 ENCOUNTER — Ambulatory Visit (HOSPITAL_COMMUNITY)
Admission: RE | Admit: 2016-09-19 | Discharge: 2016-09-20 | Disposition: A | Discharge status: Home / Self Care | Payer: Medicare Other | Source: Ambulatory Visit | Attending: Internal Medicine | Admitting: Internal Medicine

## 2016-09-19 ENCOUNTER — Encounter (HOSPITAL_COMMUNITY): Payer: Self-pay | Admitting: Anesthesiology

## 2016-09-19 ENCOUNTER — Encounter (HOSPITAL_COMMUNITY)
Admission: RE | Disposition: A | Discharge status: Home / Self Care | Payer: Self-pay | Source: Ambulatory Visit | Attending: Internal Medicine

## 2016-09-19 DIAGNOSIS — Z887 Allergy status to serum and vaccine status: Secondary | ICD-10-CM | POA: Insufficient documentation

## 2016-09-19 DIAGNOSIS — M1990 Unspecified osteoarthritis, unspecified site: Secondary | ICD-10-CM | POA: Diagnosis not present

## 2016-09-19 DIAGNOSIS — R0609 Other forms of dyspnea: Secondary | ICD-10-CM | POA: Insufficient documentation

## 2016-09-19 DIAGNOSIS — Z86010 Personal history of colonic polyps: Secondary | ICD-10-CM | POA: Insufficient documentation

## 2016-09-19 DIAGNOSIS — Z8249 Family history of ischemic heart disease and other diseases of the circulatory system: Secondary | ICD-10-CM | POA: Insufficient documentation

## 2016-09-19 DIAGNOSIS — I472 Ventricular tachycardia: Secondary | ICD-10-CM | POA: Diagnosis not present

## 2016-09-19 DIAGNOSIS — I440 Atrioventricular block, first degree: Secondary | ICD-10-CM | POA: Insufficient documentation

## 2016-09-19 DIAGNOSIS — K8020 Calculus of gallbladder without cholecystitis without obstruction: Secondary | ICD-10-CM | POA: Insufficient documentation

## 2016-09-19 DIAGNOSIS — J449 Chronic obstructive pulmonary disease, unspecified: Secondary | ICD-10-CM | POA: Insufficient documentation

## 2016-09-19 DIAGNOSIS — I470 Re-entry ventricular arrhythmia: Secondary | ICD-10-CM | POA: Diagnosis not present

## 2016-09-19 DIAGNOSIS — I491 Atrial premature depolarization: Secondary | ICD-10-CM | POA: Insufficient documentation

## 2016-09-19 DIAGNOSIS — J309 Allergic rhinitis, unspecified: Secondary | ICD-10-CM | POA: Insufficient documentation

## 2016-09-19 DIAGNOSIS — Z7982 Long term (current) use of aspirin: Secondary | ICD-10-CM | POA: Insufficient documentation

## 2016-09-19 DIAGNOSIS — F1021 Alcohol dependence, in remission: Secondary | ICD-10-CM | POA: Insufficient documentation

## 2016-09-19 DIAGNOSIS — I2510 Atherosclerotic heart disease of native coronary artery without angina pectoris: Secondary | ICD-10-CM | POA: Diagnosis not present

## 2016-09-19 DIAGNOSIS — I10 Essential (primary) hypertension: Secondary | ICD-10-CM | POA: Insufficient documentation

## 2016-09-19 DIAGNOSIS — M5136 Other intervertebral disc degeneration, lumbar region: Secondary | ICD-10-CM | POA: Insufficient documentation

## 2016-09-19 DIAGNOSIS — D509 Iron deficiency anemia, unspecified: Secondary | ICD-10-CM | POA: Diagnosis not present

## 2016-09-19 DIAGNOSIS — Q676 Pectus excavatum: Secondary | ICD-10-CM | POA: Diagnosis not present

## 2016-09-19 DIAGNOSIS — Z79899 Other long term (current) drug therapy: Secondary | ICD-10-CM | POA: Diagnosis not present

## 2016-09-19 DIAGNOSIS — I872 Venous insufficiency (chronic) (peripheral): Secondary | ICD-10-CM | POA: Insufficient documentation

## 2016-09-19 DIAGNOSIS — Z87891 Personal history of nicotine dependence: Secondary | ICD-10-CM | POA: Diagnosis not present

## 2016-09-19 DIAGNOSIS — I252 Old myocardial infarction: Secondary | ICD-10-CM | POA: Insufficient documentation

## 2016-09-19 DIAGNOSIS — Z841 Family history of disorders of kidney and ureter: Secondary | ICD-10-CM | POA: Insufficient documentation

## 2016-09-19 DIAGNOSIS — H3530 Unspecified macular degeneration: Secondary | ICD-10-CM | POA: Diagnosis not present

## 2016-09-19 DIAGNOSIS — K449 Diaphragmatic hernia without obstruction or gangrene: Secondary | ICD-10-CM | POA: Diagnosis not present

## 2016-09-19 DIAGNOSIS — I471 Supraventricular tachycardia: Secondary | ICD-10-CM | POA: Insufficient documentation

## 2016-09-19 DIAGNOSIS — D649 Anemia, unspecified: Secondary | ICD-10-CM | POA: Insufficient documentation

## 2016-09-19 DIAGNOSIS — M810 Age-related osteoporosis without current pathological fracture: Secondary | ICD-10-CM | POA: Insufficient documentation

## 2016-09-19 DIAGNOSIS — K219 Gastro-esophageal reflux disease without esophagitis: Secondary | ICD-10-CM | POA: Insufficient documentation

## 2016-09-19 DIAGNOSIS — E785 Hyperlipidemia, unspecified: Secondary | ICD-10-CM | POA: Diagnosis not present

## 2016-09-19 DIAGNOSIS — Z8261 Family history of arthritis: Secondary | ICD-10-CM | POA: Insufficient documentation

## 2016-09-19 DIAGNOSIS — Z888 Allergy status to other drugs, medicaments and biological substances status: Secondary | ICD-10-CM | POA: Insufficient documentation

## 2016-09-19 HISTORY — DX: Major depressive disorder, single episode, unspecified: F32.9

## 2016-09-19 HISTORY — DX: Exudative age-related macular degeneration, bilateral, stage unspecified: H35.3230

## 2016-09-19 HISTORY — DX: Depression, unspecified: F32.A

## 2016-09-19 HISTORY — DX: Personal history of other medical treatment: Z92.89

## 2016-09-19 HISTORY — PX: SVT ABLATION: EP1225

## 2016-09-19 HISTORY — DX: Frequency of micturition: R35.0

## 2016-09-19 HISTORY — DX: Pneumonia, unspecified organism: J18.9

## 2016-09-19 LAB — BASIC METABOLIC PANEL
ANION GAP: 10 (ref 5–15)
BUN: 14 mg/dL (ref 6–20)
CO2: 24 mmol/L (ref 22–32)
Calcium: 9.5 mg/dL (ref 8.9–10.3)
Chloride: 104 mmol/L (ref 101–111)
Creatinine, Ser: 0.9 mg/dL (ref 0.44–1.00)
GFR calc Af Amer: >60 mL/min (ref >60)
GFR, EST NON AFRICAN AMERICAN: 55 mL/min — AB (ref >60)
Glucose, Bld: 97 mg/dL (ref 65–99)
POTASSIUM: 4.3 mmol/L (ref 3.5–5.1)
SODIUM: 138 mmol/L (ref 135–145)

## 2016-09-19 LAB — CBC
HCT: 44.2 % (ref 36.0–46.0)
Hemoglobin: 15 g/dL (ref 12.0–15.0)
MCH: 30 pg (ref 26.0–34.0)
MCHC: 33.9 g/dL (ref 30.0–36.0)
MCV: 88.4 fL (ref 78.0–100.0)
PLATELETS: 351 10*3/uL (ref 150–400)
RBC: 5 MIL/uL (ref 3.87–5.11)
RDW: 14.2 % (ref 11.5–15.5)
WBC: 7.7 10*3/uL (ref 4.0–10.5)

## 2016-09-19 SURGERY — SVT ABLATION
Anesthesia: Monitor Anesthesia Care

## 2016-09-19 MED ORDER — HYDRALAZINE HCL 20 MG/ML IJ SOLN
5.0000 mg | Freq: Four times a day (QID) | INTRAMUSCULAR | Status: DC | PRN
  Administered 2016-09-19: 16:00:00 5 mg via INTRAVENOUS
  Filled 2016-09-19: qty 1

## 2016-09-19 MED ORDER — FERROUS SULFATE 325 (65 FE) MG PO TABS
325.0000 mg | ORAL_TABLET | Freq: Every day | ORAL | Status: DC
  Filled 2016-09-19: qty 1

## 2016-09-19 MED ORDER — SODIUM CHLORIDE 0.9% FLUSH: 3.0000 mL | INTRAVENOUS | Status: DC | PRN

## 2016-09-19 MED ORDER — PREDNISOLONE ACETATE 1 % OP SUSP
1.0000 [drp] | Freq: Four times a day (QID) | OPHTHALMIC | Status: DC
  Filled 2016-09-19: qty 1

## 2016-09-19 MED ORDER — ONDANSETRON HCL 4 MG/2ML IJ SOLN: 4.0000 mg | Freq: Four times a day (QID) | INTRAMUSCULAR | Status: DC | PRN

## 2016-09-19 MED ORDER — BUPIVACAINE HCL (PF) 0.25 % IJ SOLN
INTRAMUSCULAR | Status: DC | PRN
  Administered 2016-09-19: 30 mL

## 2016-09-19 MED ORDER — MOMETASONE FURO-FORMOTEROL FUM 100-5 MCG/ACT IN AERO
2.0000 | INHALATION_SPRAY | Freq: Two times a day (BID) | RESPIRATORY_TRACT | Status: DC
  Filled 2016-09-19: qty 8.8

## 2016-09-19 MED ORDER — ENSURE ENLIVE PO LIQD
237.0000 mL | Freq: Two times a day (BID) | ORAL | Status: DC
  Administered 2016-09-19: 237 mL via ORAL
  Filled 2016-09-19 (×5): qty 237

## 2016-09-19 MED ORDER — FENTANYL CITRATE (PF) 100 MCG/2ML IJ SOLN
INTRAMUSCULAR | Status: DC | PRN
  Administered 2016-09-19 (×4): 25 ug via INTRAVENOUS

## 2016-09-19 MED ORDER — ISOPROTERENOL HCL 0.2 MG/ML IJ SOLN
INTRAVENOUS | Status: DC | PRN
  Administered 2016-09-19: 2 ug/min via INTRAVENOUS

## 2016-09-19 MED ORDER — AMLODIPINE BESYLATE 2.5 MG PO TABS
2.5000 mg | ORAL_TABLET | Freq: Every day | ORAL | Status: DC
  Filled 2016-09-19: qty 1

## 2016-09-19 MED ORDER — SODIUM CHLORIDE 0.9% FLUSH
3.0000 mL | Freq: Two times a day (BID) | INTRAVENOUS | Status: DC
  Administered 2016-09-19: 3 mL via INTRAVENOUS

## 2016-09-19 MED ORDER — TOBRAMYCIN 0.3 % OP SOLN: 1.0000 [drp] | Freq: Four times a day (QID) | OPHTHALMIC | Status: DC

## 2016-09-19 MED ORDER — SODIUM CHLORIDE 0.9 % IV SOLN
INTRAVENOUS | Status: DC
  Administered 2016-09-19 (×2): via INTRAVENOUS

## 2016-09-19 MED ORDER — ALPRAZOLAM 0.25 MG PO TABS
0.2500 mg | ORAL_TABLET | Freq: Every evening | ORAL | Status: DC | PRN
  Administered 2016-09-19: 0.25 mg via ORAL
  Filled 2016-09-19: qty 1

## 2016-09-19 MED ORDER — SODIUM CHLORIDE 0.9 % IV SOLN: 250.0000 mL | INTRAVENOUS | Status: DC | PRN

## 2016-09-19 MED ORDER — ACETAMINOPHEN 325 MG PO TABS: 650.0000 mg | ORAL_TABLET | ORAL | Status: DC | PRN

## 2016-09-19 MED ORDER — DILTIAZEM HCL ER COATED BEADS 120 MG PO CP24
120.0000 mg | ORAL_CAPSULE | Freq: Every day | ORAL | Status: DC
  Administered 2016-09-19: 16:00:00 120 mg via ORAL
  Filled 2016-09-19 (×2): qty 1

## 2016-09-19 MED ORDER — TRAMADOL HCL 50 MG PO TABS: 50.0000 mg | ORAL_TABLET | Freq: Four times a day (QID) | ORAL | Status: DC | PRN

## 2016-09-19 MED ORDER — HYDROCODONE-ACETAMINOPHEN 5-325 MG PO TABS
1.0000 | ORAL_TABLET | ORAL | Status: DC | PRN
  Administered 2016-09-20: 1 via ORAL
  Filled 2016-09-19: qty 1

## 2016-09-19 MED ORDER — BUPIVACAINE HCL (PF) 0.25 % IJ SOLN
INTRAMUSCULAR | Status: AC
  Filled 2016-09-19: qty 30

## 2016-09-19 MED ORDER — MIDAZOLAM HCL 5 MG/5ML IJ SOLN
INTRAMUSCULAR | Status: DC | PRN
  Administered 2016-09-19: 1 mg via INTRAVENOUS
  Administered 2016-09-19 (×2): 0.5 mg via INTRAVENOUS

## 2016-09-19 MED ORDER — ISOPROTERENOL HCL 0.2 MG/ML IJ SOLN
INTRAMUSCULAR | Status: AC
  Filled 2016-09-19: qty 5

## 2016-09-19 SURGICAL SUPPLY — 12 items
BAG SNAP BAND KOVER 36X36 (MISCELLANEOUS) ×3 IMPLANT
BLANKET WARM UNDERBOD FULL ACC (MISCELLANEOUS) ×3 IMPLANT
CATH EZ STEER NAV 4MM D-F CUR (ABLATOR) ×3 IMPLANT
CATH JOSEPHSON QUAD-ALLRED 6FR (CATHETERS) ×6 IMPLANT
CATH WEBSTER BI DIR CS D-F CRV (CATHETERS) ×3 IMPLANT
PACK EP LATEX FREE (CUSTOM PROCEDURE TRAY) ×2
PACK EP LF (CUSTOM PROCEDURE TRAY) ×1 IMPLANT
PAD DEFIB LIFELINK (PAD) ×3 IMPLANT
PATCH CARTO3 (PAD) ×3 IMPLANT
SHEATH PINNACLE 6F 10CM (SHEATH) ×3 IMPLANT
SHEATH PINNACLE 7F 10CM (SHEATH) ×3 IMPLANT
SHEATH PINNACLE 8F 10CM (SHEATH) ×3 IMPLANT

## 2016-09-19 NOTE — Anesthesia Procedure Notes (Signed)
Performed by: Ferol Luz L Oxygen Delivery Method: Nasal cannula

## 2016-09-19 NOTE — Discharge Summary (Signed)
ELECTROPHYSIOLOGY PROCEDURE DISCHARGE SUMMARY    Patient ID: Alexandra Reyes,  MRN: [id number], DOB/AGE: Dec 15, 1925 83 y.o.  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  Hypertension 2.  COPD 3.  GERD 4.  Osteoporosis 5.  1st degree AV block  Allergies  Allergen Reactions  . Lisinopril     Unknown Pt states she's never taken Lisinopril  . Pneumovax 23 [Pneumococcal Vac Polyvalent] Other (See Comments)    Flu like symtpoms for 1 day first time she was given it, but second time there were no symptoms   . Symbicort [Budesonide-Formoterol Fumarate]     Jittery in high doses     Procedures This Admission: 1.  Electrophysiology study and radiofrequency catheter ablation on 09/19/16 by Dr Johney Frame.  This study demonstrated inducible AVNRT with successful slow pathway modification.  There were no inducible arrhythmias following ablation and no early apparent complications.   Brief HPI: Alexandra Reyes is a 83 y.o. female with a past medical history as outlined above.  She has had increasing tachypalpitations with documented SVT.  She had worsening shortness of breath on amiodarone.  Risks, benefits, and alternatives to ablation were reviewed with the patient who wished to proceed.   Hospital Course:  The patient was admitted and underwent EPS/RFCA with details as outlined above. She was monitored on telemetry overnight which demonstrated sinus rhythm with 1st degree AV block.  Groin incision was without complication.  They were examined and considered stable for discharge to home.  Follow up will be arranged in 4 weeks.  Wound care and restrictions were reviewed with the patient prior to discharge.   Physical Exam: Vitals:   09/19/16 2239  09/20/16 0105 09/20/16 0340 09/20/16 0705  BP: (!) 148/62 (!) 144/71 136/70 (!) 151/71  Pulse: 86 71 69 67  Resp: (!) (!) 23  Temp:   97.8 F (36.6 C) 98 F (36.7 C)  TempSrc:   Oral Oral  SpO2: 98%  98% 95%  Weight:      Height:        GEN- The patient is elderly appearing, alert and oriented x 3 today.   HEENT: normocephalic, atraumatic; sclera clear, conjunctiva pink; hearing intact; oropharynx clear; neck supple  Lungs- Clear to ausculation bilaterally, normal work of breathing.  No wheezes, rales, rhonchi Heart- Regular rate and rhythm  GI- soft, non-tender, non-distended, bowel sounds present  Extremities- no clubbing, cyanosis, or edema; DP/PT/radial pulses 2+ bilaterally, groin without hematoma/bruit MS- no significant deformity or atrophy Skin- warm and dry, no rash or lesion Psych- euthymic mood, full affect Neuro- strength and sensation are intact   Discharge Vitals: Blood pressure (!) 151/71, pulse 67, temperature 98 F (36.7 C), temperature source Oral, resp. rate (!) 23, height  (1.6 m), weight 140 lb (63.5 kg), SpO2 95 %.    Labs:   Lab Results  Component Value Date   WBC 7.7 09/19/2016   HGB 15.0 09/19/2016   HCT 44.2 09/19/2016   MCV 88.4 09/19/2016   PLT 351 09/19/2016     Recent Labs Lab 09/19/16 1008  NA 138  K 4.3  CL 104  CO2 24  BUN 14  CREATININE 0.90  CALCIUM 9.5  GLUCOSE 97    Discharge Medications:  Allergies as of 09/20/2016  Reactions   Lisinopril    Unknown Pt states she's never taken Lisinopril   Pneumovax 23 [pneumococcal Vac Polyvalent] Other (See Comments)   Flu like symtpoms for 1 day first time she was given it, but second time there were no symptoms    Symbicort [budesonide-formoterol Fumarate]    Jittery in high doses      Medication List    TAKE these medications   acetaminophen 500 MG tablet Commonly known as:  TYLENOL Take 1,000 mg by mouth 2 (two) times daily as needed for moderate pain or  headache.   ALPRAZolam 0.25 MG tablet Commonly known as:  XANAX TAKE 1 TABLET BY MOUTH AT BEDTIME AS NEEDED FOR ANXIETY   amLODipine 2.5 MG tablet Commonly known as:  NORVASC Take 1 tablet (2.5 mg total) by mouth daily.   aspirin 81 MG tablet Take 81 mg by mouth at bedtime.   budesonide-formoterol 80-4.5 MCG/ACT inhaler Commonly known as:  SYMBICORT Inhale 1 puff into the lungs 2 (two) times daily.   calcium carbonate 500 MG chewable tablet Commonly known as:  TUMS - dosed in mg elemental calcium Chew 3 tablets by mouth daily as needed for indigestion or heartburn.   calcium carbonate 600 MG tablet Commonly known as:  OS-CAL Take 600 mg by mouth daily.   CARTIA XT 120 MG 24 hr capsule Generic drug:  diltiazem TAKE 1 CAPSULE(S) BY MOUTH DAILY   diltiazem 30 MG tablet Commonly known as:  CARDIZEM Take s as needed for tachycardia, wait 30 minutes if persists may take an additional s, up to 3 doses   docusate sodium 100 MG capsule Commonly known as:  COLACE Take 100 mg by mouth as needed for mild constipation (Take as directed).   ferrous sulfate 325 (65 FE) MG tablet Take 325 mg by mouth daily.   furosemide 40 MG tablet Commonly known as:  LASIX TAKE 1 TABLET (40 MG TOTAL) BY MOUTH DAILY.   GLUCOSAMINE-MSM PO Take 2 tablets by mouth daily.   nitroGLYCERIN 0.4 MG SL tablet Commonly known as:  NITROSTAT Place 1 tablet (0.4 mg total) under the tongue every 5 (five) minutes as needed for chest pain.   omeprazole 40 MG capsule Commonly known as:  PRILOSEC TAKE ONE CAPSULE BY MOUTH DAILY   prednisoLONE acetate 1 % ophthalmic suspension Commonly known as:  PRED FORTE Place 1 drop into the right eye 4 (four) times daily. Starting the day before an eye injection, continue 4 days after injection   PRESCRIPTION MEDICATION Apply 1 Dose to eye every 3 (three) months. Eylea - eye injection   simvastatin 10 MG tablet Commonly known as:  ZOCOR Take 1 tablet (10 mg  total) by mouth daily at 6 PM.   tobramycin 0.3 % ophthalmic solution Commonly known as:  TOBREX Place 1 drop into the right eye 4 (four) times daily. Use the day before, the day of, and the day after eye injections   traMADol 50 MG tablet Commonly known as:  ULTRAM Take 1 tablet (50 mg total) by mouth every 6 (six) hours as needed for moderate pain.   triamcinolone cream 0.1 % Commonly known as:  KENALOG Apply 1 application topically daily as needed (hives). Apply to affected area   VITAMIN B COMPLEX PO Take 1 tablet by mouth daily.   VITAMIN C PO Take 1 tablet by mouth daily. Reported on 08/08/2015   vitamin E 200 UNIT capsule Take 200 Units by mouth daily.  Disposition:  Discharge Instructions    Diet - low sodium heart healthy    Complete by:  As directed    Increase activity slowly    Complete by:  As directed      Follow-up Information    Donato Schultz, MD Follow up on 09/30/2016.   Specialty:  Cardiology Why:  at 11:30AM  Contact information: [address] [place] [address] [phone]        Hillis Range, MD Follow up on 10/21/2016.   Specialty:  Cardiology Why:  at 1:45PM  Contact information: [address] [place] [address] [phone]           Duration of Discharge Encounter: Greater than 30 minutes including physician time.  Signed, Gypsy Balsam, NP 09/20/2016 8:00 AM  Hillis Range MD, [facility] 09/20/2016 10:15 PM

## 2016-09-19 NOTE — Transfer of Care (Signed)
Immediate Anesthesia Transfer of Care Note  Patient: Alexandra Reyes  Procedure(s) Performed: Procedure(s): SVT Ablation (N/A)  Patient Location: PACU and Cath Lab  Anesthesia Type:MAC  Level of Consciousness: awake, alert , oriented and patient cooperative  Airway & Oxygen Therapy: Patient Spontanous Breathing  Post-op Assessment: Report given to RN, Post -op Vital signs reviewed and stable and Patient moving all extremities  Post vital signs: Reviewed and stable  Last Vitals:  Vitals:   09/19/16 0930  BP: (!) 192/88  Pulse: 85  Temp: 36.5 C    Last Pain:  Vitals:   09/19/16 0930  TempSrc: Oral         Complications: No apparent anesthesia complications

## 2016-09-19 NOTE — H&P (View-Only) (Signed)
Electrophysiology Office Note  Date:  08/30/2016   ID:  Alexandra Reyes, Alexandra Reyes 04/04/1926, MRN [id number]  PCP:  Oliver Barre, MD  Cardiologist:  Dr Anne Fu, (previously Patty Sermons) Primary Electrophysiologist: Hillis Range, MD    Chief Complaint  Patient presents with  . Chest pain, unspecified type  . Shortness of Breath    on excertion     History of Present Illness: Alexandra Reyes is a 83 y.o. female who presents today for electrophysiology evaluation.  She is pleased that her SOB is much improved since stopping amiodarone.  Unfortunately, her SVT has significantly increased.   CP is improved.  Cath is reviewed with her at length today.  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  No intervention is planned for this.    Past Medical History:  Diagnosis Date  . Alcohol dependence in remission (HCC) 05/22/2011  . Allergic rhinitis, cause unspecified 05/22/2011  . Allergy   . Anemia, iron deficiency 05/18/2011  . Cholelithiasis   . COPD (chronic obstructive pulmonary disease) (HCC)   . DDD (degenerative disc disease), lumbar 05/18/2011  . First degree AV block   . GERD (gastroesophageal reflux disease)   . Hiatal hernia   . HTN (hypertension) 05/18/2011  . Hyperlipidemia 05/18/2011  . Macular degeneration   . Myocardial infarction   . OA (osteoarthritis)   . Osteoporosis 05/18/2011  . PAT (paroxysmal atrial tachycardia) (HCC)   . Pectus excavatum 05/18/2011  . Personal history of colonic polyps 10/28/2012  . SVT (supraventricular tachycardia) (HCC)    Past Surgical History:  Procedure Laterality Date  . ABDOMINAL HYSTERECTOMY  1988  . BREAST BIOPSY  1948  . BREAST LUMPECTOMY    . CARDIOVASCULAR STRESS TEST  03/08/2009   EF 84%  . RIGHT/LEFT HEART CATH AND CORONARY ANGIOGRAPHY N/A 07/25/2016   Procedure: Right/Left Heart Cath and Coronary Angiography;  Surgeon: Kathleene Hazel, MD;  Location: [facility] INVASIVE CV LAB;  Service: Cardiovascular;  Laterality:  N/A;  . TONSILLECTOMY AND ADENOIDECTOMY    . US ECHOCARDIOGRAPHY  01/17/2003   EF 55-60%     Current Outpatient Prescriptions  Medication Sig Dispense Refill  . acetaminophen (TYLENOL) 500 MG tablet Take 1,000 mg by mouth 2 (two) times daily as needed for moderate pain or headache.    . ALPRAZolam (XANAX) 0.25 MG tablet TAKE 1 TABLET BY MOUTH AT BEDTIME AS NEEDED FOR ANXIETY 30 tablet 5  . amLODipine (NORVASC) 2.5 MG tablet Take 1 tablet (2.5 mg total) by mouth daily. 90 tablet 3  . Ascorbic Acid (VITAMIN C PO) Take 1 tablet by mouth daily. Reported on 08/08/2015    . aspirin 81 MG tablet Take 81 mg by mouth at bedtime.     . B Complex Vitamins (VITAMIN B COMPLEX PO) Take 1 tablet by mouth daily.     . budesonide-formoterol (SYMBICORT) 80-4.5 MCG/ACT inhaler Inhale 1 puff into the lungs 2 (two) times daily. 1 Inhaler 2  . calcium carbonate (OS-CAL) 600 MG tablet Take 600 mg by mouth daily.    . calcium carbonate (TUMS - DOSED IN MG ELEMENTAL CALCIUM) 500 MG chewable tablet Chew 3 tablets by mouth daily as needed for indigestion or heartburn.    Marland Kitchen CARTIA XT 120 MG 24 hr capsule TAKE 1 CAPSULE(S) BY MOUTH DAILY 90 capsule 1  . docusate sodium (COLACE) 100 MG capsule Take 100 mg by mouth as needed for mild constipation (Take as directed).     . ferrous sulfate 325 (65  FE) MG tablet Take 325 mg by mouth daily.     . furosemide (LASIX) 40 MG tablet TAKE 1 TABLET (40 MG TOTAL) BY MOUTH DAILY. 90 tablet 2  . Glucosamine HCl-MSM (GLUCOSAMINE-MSM PO) Take 2 tablets by mouth daily.     . nitroGLYCERIN (NITROSTAT) 0.4 MG SL tablet Place 1 tablet (0.4 mg total) under the tongue every 5 (five) minutes as needed for chest pain. 25 tablet 3  . omeprazole (PRILOSEC) 40 MG capsule TAKE ONE CAPSULE BY MOUTH DAILY 30 capsule 3  . prednisoLONE acetate (PRED FORTE) 1 % ophthalmic suspension Place 1 drop into the right eye 4 (four) times daily. Starting the day before an eye injection, continue 4 days after  injection    . PRESCRIPTION MEDICATION Apply 1 Dose to eye every 3 (three) months. Eylea - eye injection    . simvastatin (ZOCOR) 10 MG tablet Take 1 tablet (10 mg total) by mouth daily at 6 PM. 90 tablet 3  . tobramycin (TOBREX) 0.3 % ophthalmic solution Place 1 drop into the right eye 4 (four) times daily. Use the day before, the day of, and the day after eye injections    . traMADol (ULTRAM) 50 MG tablet Take 1 tablet (50 mg total) by mouth every 6 (six) hours as needed for moderate pain. 90 tablet 1  . triamcinolone cream (KENALOG) 0.1 % Apply 1 application topically daily as needed (hives). Apply to affected area     . vitamin E 200 UNIT capsule Take 200 Units by mouth daily.     No current facility-administered medications for this visit.     Allergies:   Lisinopril; Pneumovax 23 [pneumococcal vac polyvalent]; and Symbicort [budesonide-formoterol fumarate]   Social History:  The patient  reports that she quit smoking about 22 years ago. Her smoking use included Cigarettes. She started smoking about 57 years ago. She has a 54.00 pack-year smoking history. She has never used smokeless tobacco. She reports that she does not drink alcohol or use drugs.   Family History:  The patient's family history includes Arthritis in her mother; Heart disease in her father; Heart failure in her mother; Hypertension in her father; Kidney failure in her father.    ROS:  Please see the history of present illness.   All other systems are reviewed and negative.    PHYSICAL EXAM: VS:  BP 130/80   Pulse 81   Ht  (1.6 m)   Wt 142 lb (64.4 kg)   LMP  (LMP Unknown)   SpO2 97%   BMI 25.15 kg/m  , BMI Body mass index is 25.15 kg/m. GEN: elderly, in no acute distress  HEENT: normal  Neck: no JVD, carotid bruits, or masses Cardiac: RRR; no murmurs, rubs, or gallops,+1 edema , support hose in place Respiratory:  clear to auscultation bilaterally, normal work of breathing GI: soft, nontender,  nondistended, + BS MS: no deformity or atrophy  Skin: warm and dry, leukonychia noted Neuro:  Strength and sensation are intact Psych: euthymic mood, full affect  EKG:  Ordered today sinus rhythm 73 bpm, PR 236 msec, LVH   Recent Labs: 02/14/2016: ALT 22; Hemoglobin 14.0; TSH 1.42 07/17/2016: BUN 14; Creatinine, Ser 1.15; Platelets 387; Potassium 4.6; Sodium 136    Lipid Panel     Component Value Date/Time   CHOL 167 05/24/2015 0831   TRIG 71 05/24/2015 0831   HDL 84 05/24/2015 0831   CHOLHDL 2.0 05/24/2015 0831   VLDL 14 05/24/2015 0831  LDLCALC 69 05/24/2015 0831   LDLDIRECT 129.2 05/15/2011 0925     Wt Readings from Last 3 Encounters:  08/30/16 142 lb (64.4 kg)  08/14/16 142 lb 3.2 oz (64.5 kg)  07/25/16 144 lb (65.3 kg)     ASSESSMENT AND PLAN:  1.  SVT The patient has symptomatic SVT.  I suspect that this long RP tachycardia is atach with first degree AV block though I cannot exclude a reentrant arrhythmia. Therapeutic strategies for supraventricular tachycardia including medicine and ablation were discussed in detail with the patient today. Risk, benefits, and alternatives to EP study and radiofrequency ablation were also discussed in detail today. These risks include but are not limited to stroke, bleeding, vascular damage, tamponade, perforation, damage to the heart and other structures, AV block requiring pacemaker, worsening renal function, and death. The patient understands these risk and wishes to proceed.  We will therefore proceed with catheter ablation at the next available time.  Carto and anesthesia are requested for this procedure.  2. SOB  Much improved off of amiodarone She is followed by pulmonary  3. HTN Stable No change required today  4. Venous insufficiency Support hose  Signed, Hillis Range, MD      [facility] [address] [place] [address] [phone] (office) [phone] (fax)

## 2016-09-19 NOTE — Anesthesia Preprocedure Evaluation (Addendum)
Anesthesia Evaluation  Patient identified by MRN, date of birth, ID band Patient awake    Reviewed: Allergy & Precautions, NPO status , Patient's Chart, lab work & pertinent test results  Airway Mallampati: II  TM Distance: >3 FB     Dental   Pulmonary shortness of breath, COPD, former smoker,    breath sounds clear to auscultation       Cardiovascular hypertension, + CAD, + Past MI and + DOE  + dysrhythmias  Rhythm:Regular Rate:Normal     Neuro/Psych    GI/Hepatic Neg liver ROS, hiatal hernia, GERD  ,  Endo/Other    Renal/GU negative Renal ROS     Musculoskeletal  (+) Arthritis ,   Abdominal   Peds  Hematology  (+) anemia ,   Anesthesia Other Findings   Reproductive/Obstetrics                             Anesthesia Physical Anesthesia Plan  ASA: III  Anesthesia Plan: MAC   Post-op Pain Management:    Induction: Intravenous  Airway Management Planned: Simple Face Mask and Mask  Additional Equipment:   Intra-op Plan:   Post-operative Plan:   Informed Consent:   Dental advisory given  Plan Discussed with: CRNA and Anesthesiologist  Anesthesia Plan Comments:         Anesthesia Quick Evaluation

## 2016-09-19 NOTE — Interval H&P Note (Signed)
History and Physical Interval Note:  09/19/2016 11:06 AM  Alexandra Reyes  has presented today for surgery, with the diagnosis of svt  The various methods of treatment have been discussed with the patient and family. After consideration of risks, benefits and other options for treatment, the patient has consented to  Procedure(s): SVT Ablation (N/A) as a surgical intervention .  The patient's history has been reviewed, patient examined, no change in status, stable for surgery.  I have reviewed the patient's chart and labs.  Questions were answered to the patient's satisfaction.     Hillis Range

## 2016-09-19 NOTE — Progress Notes (Addendum)
Site area: RFV x 3 Site Prior to Removal:  Level 0 Pressure Applied For:25 min Manual:   yes Patient Status During Pull:  stable Post Pull Site:  Level 0 Post Pull Instructions Given:  yes Post Pull Pulses Present: palpable Dressing Applied:  tegaderm Bedrest begins @ 1420 till 2020 Comments:

## 2016-09-19 NOTE — Discharge Instructions (Signed)
No driving for 3 days. No lifting over 5 lbs for 1 week. Keep procedure site clean & dry. If you notice increased pain, swelling, bleeding or pus, call/return!  You may shower, but no soaking baths/hot tubs/pools for 1 week.  ° °

## 2016-09-20 ENCOUNTER — Encounter (HOSPITAL_COMMUNITY): Payer: Self-pay | Admitting: Internal Medicine

## 2016-09-20 DIAGNOSIS — I470 Re-entry ventricular arrhythmia: Secondary | ICD-10-CM | POA: Diagnosis not present

## 2016-09-20 DIAGNOSIS — I10 Essential (primary) hypertension: Secondary | ICD-10-CM | POA: Diagnosis not present

## 2016-09-20 DIAGNOSIS — K219 Gastro-esophageal reflux disease without esophagitis: Secondary | ICD-10-CM | POA: Diagnosis not present

## 2016-09-20 DIAGNOSIS — I471 Supraventricular tachycardia: Secondary | ICD-10-CM | POA: Diagnosis not present

## 2016-09-20 MED ORDER — OFF THE BEAT BOOK
Freq: Once | Status: DC
  Filled 2016-09-20: qty 1

## 2016-09-20 NOTE — Progress Notes (Signed)
D/c instructions reviewed w/ pt and daughter.  Questions answered.  Able to voice when to call MD vs. 911.  Pt denies complaints.  To front lobby per w/c with NT Peggy.

## 2016-09-20 NOTE — Care Management Note (Signed)
Case Management Note  Patient Details  Name: Alexandra Reyes MRN: [id number] Date of Birth: 1926-04-28   Subjective/Objective:    s/p SVT ablation, NCM will follow for dc needs.                Action/Plan:   Expected Discharge Date:  09/20/16               Expected Discharge Plan:  Home/Self Care  In-House Referral:     Discharge planning Services  CM Consult  Post Acute Care Choice:    Choice offered to:     DME Arranged:    DME Agency:     HH Arranged:    HH Agency:     Status of Service:  Completed, signed off  If discussed at [employer] of Stay Meetings, dates discussed:    Additional Comments:  Leone Haven, RN 09/20/2016, 8:45 AM

## 2016-09-25 NOTE — Anesthesia Postprocedure Evaluation (Signed)
Anesthesia Post Note  Patient: Alexandra Reyes  Procedure(s) Performed: Procedure(s) (LRB): SVT Ablation (N/A)  Patient location during evaluation: PACU Anesthesia Type: MAC and General Level of consciousness: awake Pain management: pain level controlled Vital Signs Assessment: post-procedure vital signs reviewed and stable Respiratory status: spontaneous breathing Cardiovascular status: stable Anesthetic complications: no       Last Vitals:  Vitals:   09/20/16 0340 09/20/16 0705  BP: 136/70 (!) 151/71  Pulse: 69 67  Resp: 18 (!) 23  Temp: 36.6 C 36.7 C    Last Pain:  Vitals:   09/20/16 0705  TempSrc: Oral  PainSc:                  Marcina Kinnison

## 2016-09-30 ENCOUNTER — Encounter: Payer: Self-pay | Admitting: Cardiology

## 2016-09-30 ENCOUNTER — Ambulatory Visit (INDEPENDENT_AMBULATORY_CARE_PROVIDER_SITE_OTHER): Payer: Medicare Other | Admitting: Cardiology

## 2016-09-30 VITALS — BP 126/70 | HR 70 | Ht 63.0 in | Wt 139.0 lb

## 2016-09-30 DIAGNOSIS — I70213 Atherosclerosis of native arteries of extremities with intermittent claudication, bilateral legs: Secondary | ICD-10-CM | POA: Diagnosis not present

## 2016-09-30 DIAGNOSIS — Z79899 Other long term (current) drug therapy: Secondary | ICD-10-CM

## 2016-09-30 DIAGNOSIS — I10 Essential (primary) hypertension: Secondary | ICD-10-CM

## 2016-09-30 DIAGNOSIS — I471 Supraventricular tachycardia: Secondary | ICD-10-CM

## 2016-09-30 DIAGNOSIS — E7800 Pure hypercholesterolemia, unspecified: Secondary | ICD-10-CM

## 2016-09-30 NOTE — Patient Instructions (Signed)
Medication Instructions:  The current medical regimen is effective;  continue present plan and medications.  Labwork: Please have blood work today.  Follow-Up: Follow up in 6 month with Dr. Anne Fu.  You will receive a letter in the mail 2 months before you are due.  Please call us when you receive this letter to schedule your follow up appointment.  If you need a refill on your cardiac medications before your next appointment, please call your pharmacy.  Thank you for choosing [facility]!!

## 2016-09-30 NOTE — Progress Notes (Signed)
Cardiology Office Note    Date:  09/30/2016   ID:  Alexandra, Reyes 01/08/1926, MRN [id number]  PCP:  Oliver Barre, MD  Cardiologist:   Donato Schultz, MD , EP: Dr. Johney Frame    History of Present Illness:  Alexandra Reyes is a 83 y.o. female former patient of Dr. Yevonne Pax here for follow-up of paroxysmal supraventricular tachycardia-Had ablation by Dr. Johney Frame on 09/19/16, successful. She previously was on amiodarone but did not like the side effects, and was particularly bored about shortness of breath. Previously she had been diagnosed with paroxysmal atrial tachycardia as well. She is retired Engineer, civil (consulting). She has been a widow for over 20 years.Husband Lollie Sails died in 29.   Nuclear stress test in 2010 and 06/02/14 was normal. No prior cardiac catheterization.  Echocardiogram from 06/16/13 showed diastolic dysfunction, EF 65%.  Chest wall pain has been stable. Exertional dyspnea has been followed by pulmonary.She also has GERD-like symptoms after eating. She has had this for years. Her shortness of breath has been fairly stable. Trying Symbicort. Decreased dose because of shakiness with increased dose.  She grew up in [place], she worked at [employer], school of nursing.  Past Medical History:  Diagnosis Date  . Age-related macular degeneration, wet, both eyes (HCC)   . Alcohol dependence in remission (HCC) 05/22/2011  . Allergic rhinitis, cause unspecified 05/22/2011  . Allergy   . Anemia, iron deficiency 05/18/2011  . Cholelithiasis   . COPD (chronic obstructive pulmonary disease) (HCC)   . DDD (degenerative disc disease), lumbar 05/18/2011  . Depression    "@ times" (09/19/2016)  . First degree AV block   . GERD (gastroesophageal reflux disease)   . Hiatal hernia   . History of blood transfusion    "3 or 4 related to childbirth"  . HTN (hypertension) 05/18/2011   pt denies this hx on 09/19/2016  . Hyperlipidemia 05/18/2011  . Macular degeneration   . Myocardial  infarction (HCC)    "EKG shows I've had 2; date unknown" (09/19/2016)  . OA (osteoarthritis)    "a little here and there; mainly in the back" (09/19/2016)  . Osteoporosis 05/18/2011  . PAT (paroxysmal atrial tachycardia) (HCC)   . Pectus excavatum 05/18/2011  . Personal history of colonic polyps 10/28/2012  . Pneumonia    "as a child"  . SVT (supraventricular tachycardia) (HCC)   . Urinary frequency     Past Surgical History:  Procedure Laterality Date  . ABDOMINAL HYSTERECTOMY  1988  . BREAST BIOPSY Right 1948   "it was ok"  . CARDIAC CATHETERIZATION    . CARDIOVASCULAR STRESS TEST  03/08/2009   EF 84%  . CATARACT EXTRACTION W/ INTRAOCULAR LENS  IMPLANT, BILATERAL Bilateral   . KNEE ARTHROSCOPY Right   . RIGHT/LEFT HEART CATH AND CORONARY ANGIOGRAPHY N/A 07/25/2016   Procedure: Right/Left Heart Cath and Coronary Angiography;  Surgeon: Kathleene Hazel, MD;  [facility] INVASIVE CV LAB;  Service: Cardiovascular;  Laterality: N/A;  . SVT ABLATION  09/19/2016  . SVT ABLATION N/A 09/19/2016   Procedure: SVT Ablation;  Surgeon: Hillis Range, MD;  Location: [facility] INVASIVE CV LAB;  Service: Cardiovascular;  Laterality: N/A;  . TONSILLECTOMY AND ADENOIDECTOMY    . US ECHOCARDIOGRAPHY  01/17/2003   EF 55-60%    Current Medications: Outpatient Medications Prior to Visit  Medication Sig Dispense Refill  . acetaminophen (TYLENOL) 500 MG tablet Take 1,000 mg by mouth 2 (two) times daily as needed for moderate pain or headache.    Marland Kitchen  ALPRAZolam (XANAX) 0.25 MG tablet TAKE 1 TABLET BY MOUTH AT BEDTIME AS NEEDED FOR ANXIETY 30 tablet 5  . amLODipine (NORVASC) 2.5 MG tablet Take 1 tablet (2.5 mg total) by mouth daily. 90 tablet 3  . Ascorbic Acid (VITAMIN C PO) Take 1 tablet by mouth daily. Reported on 08/08/2015    . aspirin 81 MG tablet Take 81 mg by mouth at bedtime.     . B Complex Vitamins (VITAMIN B COMPLEX PO) Take 1 tablet by mouth daily.     . budesonide-formoterol (SYMBICORT) 80-4.5  MCG/ACT inhaler Inhale 1 puff into the lungs 2 (two) times daily. 1 Inhaler 2  . calcium carbonate (OS-CAL) 600 MG tablet Take 600 mg by mouth daily.    . calcium carbonate (TUMS - DOSED IN MG ELEMENTAL CALCIUM) 500 MG chewable tablet Chew 3 tablets by mouth daily as needed for indigestion or heartburn.    Marland Kitchen CARTIA XT 120 MG 24 hr capsule TAKE 1 CAPSULE(S) BY MOUTH DAILY 90 capsule 1  . diltiazem (CARDIZEM) 30 MG tablet Take s as needed for tachycardia, wait 30 minutes if persists may take an additional s, up to 3 doses    . docusate sodium (COLACE) 100 MG capsule Take 100 mg by mouth as needed for mild constipation (Take as directed).     . ferrous sulfate 325 (65 FE) MG tablet Take 325 mg by mouth daily.     . furosemide (LASIX) 40 MG tablet TAKE 1 TABLET (40 MG TOTAL) BY MOUTH DAILY. 90 tablet 2  . Glucosamine HCl-MSM (GLUCOSAMINE-MSM PO) Take 2 tablets by mouth daily.     . nitroGLYCERIN (NITROSTAT) 0.4 MG SL tablet Place 1 tablet (0.4 mg total) under the tongue every 5 (five) minutes as needed for chest pain. 25 tablet 3  . omeprazole (PRILOSEC) 40 MG capsule TAKE ONE CAPSULE BY MOUTH DAILY 30 capsule 3  . prednisoLONE acetate (PRED FORTE) 1 % ophthalmic suspension Place 1 drop into the right eye 4 (four) times daily. Starting the day before an eye injection, continue 4 days after injection    . PRESCRIPTION MEDICATION Apply 1 Dose to eye every 3 (three) months. Eylea - eye injection    . simvastatin (ZOCOR) 10 MG tablet Take 1 tablet (10 mg total) by mouth daily at 6 PM. 90 tablet 3  . tobramycin (TOBREX) 0.3 % ophthalmic solution Place 1 drop into the right eye 4 (four) times daily. Use the day before, the day of, and the day after eye injections    . traMADol (ULTRAM) 50 MG tablet Take 1 tablet (50 mg total) by mouth every 6 (six) hours as needed for moderate pain. 90 tablet 1  . triamcinolone cream (KENALOG) 0.1 % Apply 1 application topically daily as needed (hives). Apply to  affected area     . vitamin E 200 UNIT capsule Take 200 Units by mouth daily.     No facility-administered medications prior to visit.      Allergies:   Lisinopril; Pneumovax 23 [pneumococcal vac polyvalent]; and Symbicort [budesonide-formoterol fumarate]   Social History   Social History  . Marital status: Widowed    Spouse name: N/A  . Number of children: Y  . Years of education: 28   Occupational History  . Retired Designer, jewellery    Social History Main Topics  . Smoking status: Former Smoker    Packs/day: 1.50    Years: 36.00    Types: Cigarettes    Start date: 03/01/1959  Quit date: 06/10/1994  . Smokeless tobacco: Never Used     Comment: smoked 1- 1.5 ppd.   . Alcohol use 0.0 oz/week     Comment: 09/19/2016 "quit drinking in ~ 1978"  . Drug use: No  . Sexual activity: Not Asked   Other Topics Concern  . None   Social History Narrative   Originally from [place], [place]. She moved to [place] in 1950 during the polio epidemic. 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 Remote bird exposure. No mold or hot tub exposure.      Family History:  The patient's family history includes Arthritis in her mother; Heart disease in her father; Heart failure in her mother; Hypertension in her father; Kidney failure in her father.   ROS:   Please see the history of present illness.    ROS All other systems reviewed and are negative.   PHYSICAL EXAM:   VS:  BP 126/70   Pulse 70   Ht  (1.6 m)   Wt 139 lb (63 kg)   LMP  (LMP Unknown)   SpO2 98%   BMI 24.62 kg/m    GEN: Well nourished, well developed, in no acute distress  HEENT: normal  Neck: no JVD, carotid bruits, or masses Cardiac: RRR; no murmurs, rubs, or gallops,no edema  Respiratory:  clear to auscultation bilaterally, normal work of breathing GI: soft, nontender, nondistended, + BS MS: no deformity or atrophy  Skin: warm and dry, no rash Neuro:  Alert and Oriented x 3, Strength and sensation are  intact Psych: euthymic mood, full affect   Wt Readings from Last 3 Encounters:  09/30/16 139 lb (63 kg)  09/19/16 140 lb (63.5 kg)  09/11/16 143 lb (64.9 kg)      Studies/Labs Reviewed:   EKG: none today Recent Labs: 02/14/2016: ALT 22; TSH 1.42 09/19/2016: BUN 14; Creatinine, Ser 0.90; Hemoglobin 15.0; Platelets 351; Potassium 4.3; Sodium 138   Lipid Panel    Component Value Date/Time   CHOL 167 05/24/2015 0831   TRIG 71 05/24/2015 0831   HDL 84 05/24/2015 0831   CHOLHDL 2.0 05/24/2015 0831   VLDL 14 05/24/2015 0831   LDLCALC 69 05/24/2015 0831   LDLDIRECT 129.2 05/15/2011 0925    Additional studies/ records that were reviewed today include:  Prior office visits, echocardiogram, lab work reviewed  NUC stress: 07/12/15:  Nuclear stress EF: 80%.  Defect 1: There is a small defect of moderate severity present in the basal inferoseptal location.  Findings consistent with prior myocardial infarction.  This is a low risk study.   ASSESSMENT:    1. Paroxysmal SVT (supraventricular tachycardia) (HCC)   2. Essential hypertension   3. Atherosclerosis of native artery of both lower extremities with intermittent claudication (HCC)   4. Pure hypercholesterolemia   5. Encounter for long-term current use of medication      PLAN:  In order of problems listed above:  Paroxysmal supraventricular tachycardia  - Had SVT ablation on 09/19/16 by Dr. Johney Frame. Inducible AVNRT. Successful slow pathway modification. We went through several questions today. She is doing well. No further tachycardia arrhythmia.  - She was worried about shortness of breath with amiodarone. She was worried about potential side effects of amiodarone.In fact, her eye doctor noticed deposition.  - No indication for pacemaker.  Diastolic dysfunction  - Stable, normal ejection fraction  - Has not had any evidence of heart failure  Mild coronary artery calcification  - Seen on CT scan  10/09/15. Continue with  prevention efforts.  Dyspnea/chest discomfort  - Recent nuclear stress test reassuring, EF reassuring. Right-sided below rib discomfort, sometimes worse with palpation. Likely musculoskeletal.  - Pulmonary following  - Cardiac catheterization was contemplated previously however she was reluctant to pursue. I agree with conservative management at this point. Recent reassuring nuclear stress test. She saw gastroenterology, Dr. Marina Goodell, reassurance.  Chronic venous insufficiency/edema  - Support hose, agree  - Leg elevation  Essential hypertension  -Medications reviewed, stable. No changes.  Hyperlipidemia  - She is now on some statin 10 mg. If this is [place], we will increase it to 20 mg. We will check lipid profile. The reason it was decreased previously was because she was on amiodarone.    Medication Adjustments/Labs and Tests Ordered: Current medicines are reviewed at length with the patient today.  Concerns regarding medicines are outlined above.  Medication changes, Labs and Tests ordered today are listed in the Patient Instructions below. Patient Instructions  Medication Instructions:  The current medical regimen is effective;  continue present plan and medications.  Labwork: Please have blood work today.  Follow-Up: Follow up in 6 month with Dr. Anne Fu.  You will receive a letter in the mail 2 months before you are due.  Please call us when you receive this letter to schedule your follow up appointment.  If you need a refill on your cardiac medications before your next appointment, please call your pharmacy.  Thank you for choosing [facility]!!        Signed, Donato Schultz, MD  09/30/2016 11:34 AM    [facility] [address] [place], [place], [place]  [address] Phone: [phone]; Fax: [phone]

## 2016-10-01 ENCOUNTER — Encounter: Payer: Self-pay | Admitting: Internal Medicine

## 2016-10-01 LAB — LIPID PANEL
CHOL/HDL RATIO: 2.2 ratio (ref 0.0–4.4)
Cholesterol, Total: 160 mg/dL (ref 100–199)
HDL: 73 mg/dL (ref >39)
LDL CALC: 69 mg/dL (ref 0–99)
Triglycerides: 90 mg/dL (ref 0–149)
VLDL CHOLESTEROL CAL: 18 mg/dL (ref 5–40)

## 2016-10-01 LAB — ALT: ALT: 16 IU/L (ref 0–32)

## 2016-10-02 DIAGNOSIS — F4329 Adjustment disorder with other symptoms: Secondary | ICD-10-CM | POA: Diagnosis not present

## 2016-10-16 DIAGNOSIS — H353211 Exudative age-related macular degeneration, right eye, with active choroidal neovascularization: Secondary | ICD-10-CM | POA: Diagnosis not present

## 2016-10-16 DIAGNOSIS — H43813 Vitreous degeneration, bilateral: Secondary | ICD-10-CM | POA: Diagnosis not present

## 2016-10-16 DIAGNOSIS — H35371 Puckering of macula, right eye: Secondary | ICD-10-CM | POA: Diagnosis not present

## 2016-10-16 DIAGNOSIS — H353222 Exudative age-related macular degeneration, left eye, with inactive choroidal neovascularization: Secondary | ICD-10-CM | POA: Diagnosis not present

## 2016-10-21 ENCOUNTER — Ambulatory Visit (INDEPENDENT_AMBULATORY_CARE_PROVIDER_SITE_OTHER): Payer: Medicare Other | Admitting: Internal Medicine

## 2016-10-21 VITALS — BP 130/70 | HR 74 | Ht 63.5 in | Wt 140.1 lb

## 2016-10-21 DIAGNOSIS — I10 Essential (primary) hypertension: Secondary | ICD-10-CM

## 2016-10-21 DIAGNOSIS — I70213 Atherosclerosis of native arteries of extremities with intermittent claudication, bilateral legs: Secondary | ICD-10-CM

## 2016-10-21 DIAGNOSIS — I471 Supraventricular tachycardia: Secondary | ICD-10-CM

## 2016-10-21 MED ORDER — AMLODIPINE BESYLATE 5 MG PO TABS: 5.0000 mg | ORAL_TABLET | Freq: Every day | ORAL | 3 refills | Status: DC

## 2016-10-21 NOTE — Progress Notes (Signed)
PCP: Alexandra LevinsJohn, Alexandra Samuelson W, MD Primary Cardiologist:  Dr Alexandra FuSkains  [name] is a 83 y.o. female who presents today for routine electrophysiology followup.  Since her recent ablation, the patient reports doing very well.  She has had no further SVT.  Denies procedure related complications.  SOB is improved.   Today, she denies symptoms of palpitations, chest pain, lower extremity edema, dizziness, presyncope, or syncope.  The patient is otherwise without complaint today.   Past Medical History:  Diagnosis Date  . Age-related macular degeneration, wet, both eyes (HCC)   . Alcohol dependence in remission (HCC) 05/22/2011  . Allergic rhinitis, cause unspecified 05/22/2011  . Allergy   . Anemia, iron deficiency 05/18/2011  . Cholelithiasis   . COPD (chronic obstructive pulmonary disease) (HCC)   . DDD (degenerative disc disease), lumbar 05/18/2011  . Depression    "@ times" (09/19/2016)  . First degree AV block   . GERD (gastroesophageal reflux disease)   . Hiatal hernia   . History of blood transfusion    "3 or 4 related to childbirth"  . HTN (hypertension) 05/18/2011   pt denies this hx on 09/19/2016  . Hyperlipidemia 05/18/2011  . Macular degeneration   . Myocardial infarction (HCC)    "EKG shows I've had 2; date unknown" (09/19/2016)  . OA (osteoarthritis)    "a little here and there; mainly in the back" (09/19/2016)  . Osteoporosis 05/18/2011  . PAT (paroxysmal atrial tachycardia) (HCC)   . Pectus excavatum 05/18/2011  . Personal history of colonic polyps 10/28/2012  . Pneumonia    "as a child"  . SVT (supraventricular tachycardia) (HCC)   . Urinary frequency    Past Surgical History:  Procedure Laterality Date  . ABDOMINAL HYSTERECTOMY  1988  . BREAST BIOPSY Right 1948   "it was ok"  . CARDIAC CATHETERIZATION    . CARDIOVASCULAR STRESS TEST  03/08/2009   EF 84%  . CATARACT EXTRACTION Reyes/ INTRAOCULAR LENS  IMPLANT, BILATERAL Bilateral   . KNEE ARTHROSCOPY Right   . RIGHT/LEFT HEART  CATH AND CORONARY ANGIOGRAPHY N/A 07/25/2016   Procedure: Right/Left Heart Cath and Coronary Angiography;  Surgeon: Alexandra Hazelhristopher D McAlhany, MD;  [facility];  Service: Cardiovascular;  Laterality: N/A;  . SVT ABLATION  09/19/2016  . SVT ABLATION N/A 09/19/2016   Procedure: SVT Ablation;  Surgeon: Alexandra RangeJames Kabao Leite, MD;  [facility];  Service: Cardiovascular;  Laterality: N/A;  . TONSILLECTOMY AND ADENOIDECTOMY    . US ECHOCARDIOGRAPHY  01/17/2003   EF 55-60%    ROS- all systems are reviewed and negatives except as per HPI above  Current Outpatient Prescriptions  Medication Sig Dispense Refill  . acetaminophen (TYLENOL) 500 MG tablet Take 1,000 mg by mouth 2 (two) times daily as needed for moderate pain or headache.    . ALPRAZolam (XANAX) 0.25 MG tablet TAKE 1 TABLET BY MOUTH AT BEDTIME AS NEEDED FOR ANXIETY 30 tablet 5  . amLODipine (NORVASC) 2.5 MG tablet Take 1 tablet (2.5 mg total) by mouth daily. 90 tablet 3  . Ascorbic Acid (VITAMIN C PO) Take 1 tablet by mouth daily. Reported on 08/08/2015    . aspirin 81 MG tablet Take 81 mg by mouth at bedtime.     . B Complex Vitamins (VITAMIN B COMPLEX PO) Take 1 tablet by mouth daily.     . budesonide-formoterol (SYMBICORT) 80-4.5 MCG/ACT inhaler Inhale 1 puff into the lungs 2 (two) times daily. 1 Inhaler 2  . calcium  carbonate (OS-CAL) 600 MG tablet Take 600 mg by mouth daily.    . calcium carbonate (TUMS - DOSED IN MG ELEMENTAL CALCIUM) 500 MG chewable tablet Chew 3 tablets by mouth daily as needed for indigestion or heartburn.    Marland Kitchen CARTIA XT 120 MG 24 hr capsule TAKE 1 CAPSULE(S) BY MOUTH DAILY 90 capsule 1  . diltiazem (CARDIZEM) 30 MG tablet Take 30mg s as needed for tachycardia, wait 30 minutes if persists may take an additional 30mg s, up to 3 doses    . docusate sodium (COLACE) 100 MG capsule Take 100 mg by mouth as needed for mild constipation (Take as directed).     . ferrous sulfate 325 (65 FE) MG tablet Take 325  mg by mouth daily.     . furosemide (LASIX) 40 MG tablet TAKE 1 TABLET (40 MG TOTAL) BY MOUTH DAILY. 90 tablet 2  . Glucosamine HCl-MSM (GLUCOSAMINE-MSM PO) Take 2 tablets by mouth daily.     . nitroGLYCERIN (NITROSTAT) 0.4 MG SL tablet Place 1 tablet (0.4 mg total) under the tongue every 5 (five) minutes as needed for chest pain. 25 tablet 3  . omeprazole (PRILOSEC) 40 MG capsule TAKE ONE CAPSULE BY MOUTH DAILY 30 capsule 3  . prednisoLONE acetate (PRED FORTE) 1 % ophthalmic suspension Place 1 drop into the right eye 4 (four) times daily. Starting the day before an eye injection, continue 4 days after injection    . PRESCRIPTION MEDICATION Apply 1 Dose to eye every 3 (three) months. Eylea - eye injection    . simvastatin (ZOCOR) 10 MG tablet Take 1 tablet (10 mg total) by mouth daily at 6 PM. 90 tablet 3  . tobramycin (TOBREX) 0.3 % ophthalmic solution Place 1 drop into the right eye 4 (four) times daily. Use the day before, the day of, and the day after eye injections    . traMADol (ULTRAM) 50 MG tablet Take 1 tablet (50 mg total) by mouth every 6 (six) hours as needed for moderate pain. 90 tablet 1  . triamcinolone cream (KENALOG) 0.1 % Apply 1 application topically daily as needed (hives). Apply to affected area     . vitamin E 200 UNIT capsule Take 200 Units by mouth daily.     No current facility-administered medications for this visit.     Physical Exam: Vitals:   10/21/16 1349  BP: 130/70  Pulse: 74  SpO2: 97%  Weight: 140 lb 2 oz (63.6 kg)  Height: 5' 3.5" (1.613 m)    GEN- The patient is well appearing, alert and oriented x 3 today.   Head- normocephalic, atraumatic Eyes-  Sclera clear, conjunctiva pink Ears- hearing intact Oropharynx- clear Lungs- Clear to ausculation bilaterally, normal work of breathing Heart- Regular rate and rhythm, no murmurs, rubs or gallops, PMI not laterally displaced GI- soft, NT, ND, + BS Extremities- no clubbing, cyanosis, or edema  ekg today  reveals sinus rhythm, PR 198 msec, LVH, LAD, poor R wave progression  Assessment and Plan:  1. SVT Resolved s/p ablation Stop diltiazem  2. Hypertension Elevated at times (per review of her home journal) Increase norvasc to 5mg  daily  3. SOB improving  Follow-up with Dr Alexandra Reyes in 6 months I will see in 3 months  Alexandra Range MD, [facility] 10/21/2016 2:23 PM

## 2016-10-21 NOTE — Patient Instructions (Signed)
Medication Instructions:  STOP diltiazem  INCREASE amlodipine to 5 mg once daily  Labwork: NONE  Testing/Procedures: NONE  Follow-Up: Your physician recommends that you schedule a follow-up appointment in: 3 months with Dr. [name].   Any Other Special Instructions Will Be Listed Below (If Applicable).     If you need a refill on your cardiac medications before your next appointment, please call your pharmacy.

## 2016-10-23 DIAGNOSIS — N816 Rectocele: Secondary | ICD-10-CM | POA: Diagnosis not present

## 2016-10-30 DIAGNOSIS — F4329 Adjustment disorder with other symptoms: Secondary | ICD-10-CM | POA: Diagnosis not present

## 2016-11-26 NOTE — Progress Notes (Signed)
Subjective:    Patient ID: Alexandra Reyes, female    DOB: 1925/07/22, 83 y.o.   MRN: [id number]  C.C.:  Follow-up for ILD, Atypical Chest Pain, & GERD.  HPI ILD: Pattern represents early UIP versus fibrotic NSIP pattern on CT imaging. Not currently on immunosuppression. She does feel her dyspnea has improved after her ablation. No new cough or wheezing.  Atypical chest pain: Previously responded to prednisone therapy with some pleuritic component. Prescribed Ultram at last appointment. Continues to have intermittent chest discomfort relieved with her Ultram. She is continuing to have chest wall pain.   GERD: Previously evaluated by GI & utilizing Tums intermittently. No reflux or dyspepsia.   Review of Systems She reports no palpitations. No fever or chills. No abdominal pain or nausea. She has lost about 20 pounds without trying.   Allergies  Allergen Reactions  . Lisinopril     Unknown Pt states she's never taken Lisinopril  . Pneumovax 23 [Pneumococcal Vac Polyvalent] Other (See Comments)    Flu like symtpoms for 1 day first time she was given it, but second time there were no symptoms   . Symbicort [Budesonide-Formoterol Fumarate]     Jittery in high doses    Current Outpatient Prescriptions on File Prior to Visit  Medication Sig Dispense Refill  . acetaminophen (TYLENOL) 500 MG tablet Take 1,000 mg by mouth 2 (two) times daily as needed for moderate pain or headache.    . ALPRAZolam (XANAX) 0.25 MG tablet TAKE 1 TABLET BY MOUTH AT BEDTIME AS NEEDED FOR ANXIETY 30 tablet 5  . amLODipine (NORVASC) 5 MG tablet Take 1 tablet (5 mg total) by mouth daily. 90 tablet 3  . Ascorbic Acid (VITAMIN C PO) Take 1 tablet by mouth daily. Reported on 08/08/2015    . aspirin 81 MG tablet Take 81 mg by mouth at bedtime.     . B Complex Vitamins (VITAMIN B COMPLEX PO) Take 1 tablet by mouth daily.     . budesonide-formoterol (SYMBICORT) 80-4.5 MCG/ACT inhaler Inhale 1 puff into the lungs 2  (two) times daily. 1 Inhaler 2  . calcium carbonate (OS-CAL) 600 MG tablet Take 600 mg by mouth daily.    . calcium carbonate (TUMS - DOSED IN MG ELEMENTAL CALCIUM) 500 MG chewable tablet Chew 3 tablets by mouth daily as needed for indigestion or heartburn.    . docusate sodium (COLACE) 100 MG capsule Take 100 mg by mouth as needed for mild constipation (Take as directed).     . ferrous sulfate 325 (65 FE) MG tablet Take 325 mg by mouth daily.     . furosemide (LASIX) 40 MG tablet TAKE 1 TABLET (40 MG TOTAL) BY MOUTH DAILY. 90 tablet 2  . Glucosamine HCl-MSM (GLUCOSAMINE-MSM PO) Take 2 tablets by mouth daily.     . nitroGLYCERIN (NITROSTAT) 0.4 MG SL tablet Place 1 tablet (0.4 mg total) under the tongue every 5 (five) minutes as needed for chest pain. 25 tablet 3  . omeprazole (PRILOSEC) 40 MG capsule TAKE ONE CAPSULE BY MOUTH DAILY 30 capsule 3  . prednisoLONE acetate (PRED FORTE) 1 % ophthalmic suspension Place 1 drop into the right eye 4 (four) times daily. Starting the day before an eye injection, continue 4 days after injection    . PRESCRIPTION MEDICATION Apply 1 Dose to eye every 3 (three) months. Eylea - eye injection    . simvastatin (ZOCOR) 10 MG tablet Take 1 tablet (10 mg total) by mouth daily at  6 PM. 90 tablet 3  . tobramycin (TOBREX) 0.3 % ophthalmic solution Place 1 drop into the right eye 4 (four) times daily. Use the day before, the day of, and the day after eye injections    . traMADol (ULTRAM) 50 MG tablet Take 1 tablet (50 mg total) by mouth every 6 (six) hours as needed for moderate pain. 90 tablet 1  . triamcinolone cream (KENALOG) 0.1 % Apply 1 application topically daily as needed (hives). Apply to affected area     . vitamin E 200 UNIT capsule Take 200 Units by mouth daily.    Marland Kitchen CARTIA XT 120 MG 24 hr capsule TAKE 1 CAPSULE(S) BY MOUTH DAILY (Patient not taking: Reported on 11/27/2016) 90 capsule 1   No current facility-administered medications on file prior to visit.      Past Medical History:  Diagnosis Date  . Age-related macular degeneration, wet, both eyes ([place])   . Alcohol dependence in remission ([place]) 05/22/2011  . Allergic rhinitis, cause unspecified 05/22/2011  . Allergy   . Anemia, iron deficiency 05/18/2011  . Cholelithiasis   . COPD (chronic obstructive pulmonary disease) ([place])   . DDD (degenerative disc disease), lumbar 05/18/2011  . Depression    "@ times" (09/19/2016)  . First degree AV block   . GERD (gastroesophageal reflux disease)   . Hiatal hernia   . History of blood transfusion    "3 or 4 related to childbirth"  . HTN (hypertension) 05/18/2011   pt denies this hx on 09/19/2016  . Hyperlipidemia 05/18/2011  . Macular degeneration   . Myocardial infarction ([place])    "EKG shows I've had 2; date unknown" (09/19/2016)  . OA (osteoarthritis)    "a little here and there; mainly in the back" (09/19/2016)  . Osteoporosis 05/18/2011  . PAT (paroxysmal atrial tachycardia) ([place])   . Pectus excavatum 05/18/2011  . Personal history of colonic polyps 10/28/2012  . Pneumonia    "as a child"  . SVT (supraventricular tachycardia) ([place])   . Urinary frequency     Past Surgical History:  Procedure Laterality Date  . ABDOMINAL HYSTERECTOMY  1988  . BREAST BIOPSY Right 1948   "it was ok"  . CARDIAC CATHETERIZATION    . CARDIOVASCULAR STRESS TEST  03/08/2009   EF 84%  . CATARACT EXTRACTION W/ INTRAOCULAR LENS  IMPLANT, BILATERAL Bilateral   . KNEE ARTHROSCOPY Right   . RIGHT/LEFT HEART CATH AND CORONARY ANGIOGRAPHY N/A 07/25/2016   Procedure: Right/Left Heart Cath and Coronary Angiography;  Surgeon: Burnell Blanks, MD;  Location: [facility];  Service: Cardiovascular;  Laterality: N/A;  . SVT ABLATION  09/19/2016  . SVT ABLATION N/A 09/19/2016   Procedure: SVT Ablation;  Surgeon: Thompson Grayer, MD;  Location: [facility];  Service: Cardiovascular;  Laterality: N/A;  . TONSILLECTOMY AND ADENOIDECTOMY    . US ECHOCARDIOGRAPHY   01/17/2003   EF 55-60%    Family History  Problem Relation Age of Onset  . Heart failure Mother   . Arthritis Mother   . Kidney failure Father   . Heart disease Father   . Hypertension Father     Social History   Social History  . Marital status: Widowed    Spouse name: N/A  . Number of children: Y  . Years of education: 16   Occupational History  . Retired Equities trader    Social History Main Topics  . Smoking status: Former Smoker    Packs/day: 1.50  Years: 36.00    Types: Cigarettes    Start date: 03/01/1959    Quit date: 06/10/1994  . Smokeless tobacco: Never Used     Comment: smoked 1- 1.5 ppd.   . Alcohol use 0.0 oz/week     Comment: 09/19/2016 "quit drinking in ~ 1978"  . Drug use: No  . Sexual activity: Not Asked   Other Topics Concern  . None   Social History Narrative   Originally from [place], [place]. She moved to [place] in 1950 during the polio epidemic. Previously worked as a Marine scientist. No pets currently. Remote bird exposure. No mold or hot tub exposure.       Objective:   Physical Exam BP 120/68 (BP Location: Left Arm, Cuff Size: Normal)   Pulse 69   Ht _0  (1.6 m)   Wt 137 lb 12.8 oz (62.5 kg)   LMP  (LMP Unknown)   SpO2 97%   BMI 24.41 kg/m   General:  Awake. Alert. No distress.  Integument:  Warm & dry. No rash on exposed skin. Extremities:  No cyanosis or clubbing.  HEENT:  Moist mucus membranes. Moderate bilateral nasal turbinate swelling. No oral ulcers. Cardiovascular:  Regular rate and rhythm. No edema. Normal S1 & S2.  Pulmonary:  Mild basilar Velcro crackles. Normal work of breathing on room air. Abdomen: Soft. Normal bowel sounds. Protuberant Musculoskeletal:  Normal bulk and tone. No chest wall deformity. No joint deformity or effusion appreciated.  PFT 07/12/15: FVC 2.29 L (106%) FEV1 1.57 L (99%) FEV1/FVC 0.68 FEF 25-75 0.78 L (81%) no bronchodilator response TLC 3.59 L (73%) RV 52% ERV 342% DLCO uncorrected 52% 05/14/13: FVC  2.34 L (103%) FEV1 1.63 L (97%) FEV1/FVC 0.69 FEF 25-75 0.83 L (76%) no bronchodilator response TLC 4.51 L (92%) ERV 284% DLCO uncorrected 55%  IMAGING CT CHEST W/O 04/03/16 (previously reviewed by me):  No pleural effusion or thickening. No pathologic mediastinal adenopathy. No pericardial effusion. Basilar predominant subpleural jugular changes that are mild and more pronounced on CT imaging.  CT CHEST W/O 10/09/15 (previously reviewed by me):  No pneumothorax or effusion. Minimal posterior subsegmental atelectasis. No appreciable bronchiectasis, mass, or nodule. No pathologic mediastinal adenopathy. No pericardial effusion. Mild coronary artery calcification.  BARIUM SWALLOW (08/11/15): No evidence for stricture or mass lesion. Moderate esophageal dysmotility without hiatal hernia. Small amount spontaneous reflux into the lower third of the esophagus.  CTA CHEST 08/03/15 (previously reviewed by me): Mild lower lobe predominant atelectasis. No other nodule or opacity appreciated. No ulnar emboli. No pleural effusion or thickening. No pericardial effusion. No pathologic mediastinal adenopathy.  CARDIAC LHC (07/25/16):  Prox RCA lesion, 100 %stenosed.  Prox LAD lesion, 20 %stenosed.  LV end diastolic pressure is normal.   1. 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 The mid and distal vessel fills briskly from left to right collaterals.  2. Mild non-obstructive disease in the LAD 3. Normal filling pressures.  4. N specific ormal PA pressures  TTE (07/07/15): Narrow LVOT with normal cavity size. Mild LVH. EF 55-60%. Grade 1 diastolic dysfunction. LA & RA normal in size. RV normal in size and function. No aortic regurgitation. No mitral stenosis or regurgitation. No pulmonic regurgitation. Mild tricuspid regurgitation. No pericardial effusion.  LABS 04/17/16 CRP:  0.2 ESR:  19 ANA:  Negative Anti-CCP:  <16 RF:  <14 SCL-70:  <1.0 Hypersensitivity Pneumonitis Panel:  Negative    08/02/15 CBC: 9.2/14.4/42.7/390 BMP: 133/4.3/98/28/17/1.09/94/9.5 LFT: 4.2/7.0/0.5/62/18/16    Assessment & Plan:  83 y.o. female with ILD, atypical chest pain, & GERD. female with ILD, atypical chest pain, & GERD. Patient's interstitial lung disease has no symptomatic worsening. Her atypical chest pain is likely inflammatory in nature and musculoskeletal. I do believe she may benefit from massage therapy versus referral to a pain specialist. Her weight loss is certainly concerning but she has no other symptoms that would suggest an underlying malignancy. I instructed the patient to notify me if she had any new breathing problems or questions before next appointment.  1. ILD: Continuing to hold on immunosuppression. Noted testing or medications. 2. Atypical chest pain: Suspect musculoskeletal in etiology. Recommended patient use Ultram and place of ibuprofen. Patient to investigate coverage for massage therapy and deferring referral to pain specialist. 3. GERD: Minimal symptoms. No new medications. 4. Unexplained weight loss:  Patient to notify me if weight loss continues. Deferring abdominal imaging at this time. 5. Health maintenance: Status post influenza vaccine November 2017, Prevnar January 2015, & Pneumovax January 1998. 6. Follow-up: Return to clinic in 3 months or sooner if needed.  Sonia Baller Ashok Cordia, M.D. [facility] Pager:  [phone] chest  After 3pm or if no response, call [phone] 12:00 PM 11/27/16   I'm holding off on further review his

## 2016-11-27 ENCOUNTER — Encounter: Payer: Self-pay | Admitting: Pulmonary Disease

## 2016-11-27 ENCOUNTER — Ambulatory Visit (INDEPENDENT_AMBULATORY_CARE_PROVIDER_SITE_OTHER): Payer: Medicare Other | Admitting: Pulmonary Disease

## 2016-11-27 VITALS — BP 120/68 | HR 69 | Ht 63.0 in | Wt 137.8 lb

## 2016-11-27 DIAGNOSIS — J849 Interstitial pulmonary disease, unspecified: Secondary | ICD-10-CM

## 2016-11-27 DIAGNOSIS — I70213 Atherosclerosis of native arteries of extremities with intermittent claudication, bilateral legs: Secondary | ICD-10-CM

## 2016-11-27 DIAGNOSIS — R0789 Other chest pain: Secondary | ICD-10-CM | POA: Diagnosis not present

## 2016-11-27 DIAGNOSIS — K219 Gastro-esophageal reflux disease without esophagitis: Secondary | ICD-10-CM

## 2016-11-27 DIAGNOSIS — R634 Abnormal weight loss: Secondary | ICD-10-CM

## 2016-11-27 MED ORDER — TRAMADOL HCL 50 MG PO TABS: 50.0000 mg | ORAL_TABLET | Freq: Four times a day (QID) | ORAL | 0 refills | Status: DC | PRN

## 2016-11-27 NOTE — Patient Instructions (Signed)
   Let me know if you continue to lose weight or you have any new breathing problems.  Remember to use your Tramadol/Ultram in place of the Ibuprofen.  Check with Medicare to see if they'll cover a visit to a massage therapist which may help with your pain.   I will see you back in 3 months or sooner if needed.

## 2016-12-03 ENCOUNTER — Other Ambulatory Visit: Payer: Self-pay | Admitting: Internal Medicine

## 2016-12-04 DIAGNOSIS — F4329 Adjustment disorder with other symptoms: Secondary | ICD-10-CM | POA: Diagnosis not present

## 2016-12-09 DIAGNOSIS — Z1231 Encounter for screening mammogram for malignant neoplasm of breast: Secondary | ICD-10-CM | POA: Diagnosis not present

## 2016-12-09 LAB — HM MAMMOGRAPHY

## 2016-12-24 NOTE — Progress Notes (Addendum)
Subjective:   Alexandra Reyes is a 83 y.o. female who presents for an Initial Medicare Annual Wellness Visit.  Review of Systems    No ROS.  Medicare Wellness Visit. Additional risk factors are reflected in the social history.   Cardiac Risk Factors include: advanced age (>14men, >39 women);dyslipidemia;hypertension Sleep patterns: feels rested on waking, gets up 3-4 times nightly to void and sleeps 6-7 hours nightly.    Home Safety/Smoke Alarms: Feels safe in home. Smoke alarms in place.  Living environment; residence and Firearm Safety: 1-story house/ trailer, no firearms . Lives alone, no needs for DME, good support system Seat Belt Safety/Bike Helmet: Wears seat belt.   Counseling:   Eye Exam- appointment every 4 months Dr. Allyne Gee macular degeneration Dental- appointment yearly   Female:   Pap- N/A      Mammo- Last 12/06/15, BI-RADS category 1: negative      Dexa scan- Last 12/06/15,  T-score -2.1      CCS- N/A    Objective:    Today's Vitals   12/25/16 1148 12/25/16 1150  BP: 136/72   Pulse: 72   Resp: 20   SpO2: 98%   Weight: 137 lb (62.1 kg)   Height: 5\' 3"  (1.6 m)   PainSc:  3    Body mass index is 24.27 kg/m.   Current Medications (verified) Outpatient Encounter Prescriptions as of 12/25/2016  Medication Sig  . acetaminophen (TYLENOL) 500 MG tablet Take 1,000 mg by mouth 2 (two) times daily as needed for moderate pain or headache.  . ALPRAZolam (XANAX) 0.25 MG tablet TAKE 1 TABLET BY MOUTH AT BEDTIME AS NEEDED FOR ANXIETY  . amLODipine (NORVASC) 5 MG tablet Take 1 tablet (5 mg total) by mouth daily.  . Ascorbic Acid (VITAMIN C PO) Take 1 tablet by mouth daily. Reported on 08/08/2015  . aspirin 81 MG tablet Take 81 mg by mouth at bedtime.   . B Complex Vitamins (VITAMIN B COMPLEX PO) Take 1 tablet by mouth daily.   . calcium carbonate (OS-CAL) 600 MG tablet Take 600 mg by mouth daily.  . calcium carbonate (TUMS - DOSED IN MG ELEMENTAL CALCIUM) 500 MG  chewable tablet Chew 3 tablets by mouth daily as needed for indigestion or heartburn.  . docusate sodium (COLACE) 100 MG capsule Take 100 mg by mouth as needed for mild constipation (Take as directed).   . ferrous sulfate 325 (65 FE) MG tablet Take 325 mg by mouth daily.   . furosemide (LASIX) 40 MG tablet TAKE 1 TABLET (40 MG TOTAL) BY MOUTH DAILY.  Marland Kitchen Glucosamine HCl-MSM (GLUCOSAMINE-MSM PO) Take 2 tablets by mouth daily.   . nitroGLYCERIN (NITROSTAT) 0.4 MG SL tablet Place 1 tablet (0.4 mg total) under the tongue every 5 (five) minutes as needed for chest pain.  Marland Kitchen omeprazole (PRILOSEC) 40 MG capsule TAKE ONE CAPSULE BY MOUTH DAILY  . PRESCRIPTION MEDICATION Apply 1 Dose to eye every 3 (three) months. Eylea - eye injection  . simvastatin (ZOCOR) 10 MG tablet Take 1 tablet (10 mg total) by mouth daily at 6 PM.  . SYMBICORT 80-4.5 MCG/ACT inhaler INHALE ONE PUFF BY MOUTH TWICE A DAY  . tobramycin (TOBREX) 0.3 % ophthalmic solution Place 1 drop into the right eye 4 (four) times daily. Use the day before, the day of, and the day after eye injections  . traMADol (ULTRAM) 50 MG tablet Take 1 tablet (50 mg total) by mouth every 6 (six) hours as needed for moderate pain.  Marland Kitchen  triamcinolone cream (KENALOG) 0.1 % Apply 1 application topically daily as needed (hives). Apply to affected area   . vitamin E 200 UNIT capsule Take 200 Units by mouth daily.  . [DISCONTINUED] CARTIA XT 120 MG 24 hr capsule TAKE 1 CAPSULE(S) BY MOUTH DAILY (Patient not taking: Reported on 12/25/2016)  . [DISCONTINUED] prednisoLONE acetate (PRED FORTE) 1 % ophthalmic suspension Place 1 drop into the right eye 4 (four) times daily. Starting the day before an eye injection, continue 4 days after injection   No facility-administered encounter medications on file as of 12/25/2016.     Allergies (verified) Symbicort [budesonide-formoterol fumarate]   History: Past Medical History:  Diagnosis Date  . Age-related macular degeneration,  wet, both eyes ([facility])   . Alcohol dependence in remission ([facility]) 05/22/2011  . Allergic rhinitis, cause unspecified 05/22/2011  . Allergy   . Anemia, iron deficiency 05/18/2011  . Cholelithiasis   . COPD (chronic obstructive pulmonary disease) ([facility])   . DDD (degenerative disc disease), lumbar 05/18/2011  . Depression    "@ times" (09/19/2016)  . First degree AV block   . GERD (gastroesophageal reflux disease)   . Hiatal hernia   . History of blood transfusion    "3 or 4 related to childbirth"  . HTN (hypertension) 05/18/2011   pt denies this hx on 09/19/2016  . Hyperlipidemia 05/18/2011  . Macular degeneration   . Myocardial infarction ([facility])    "EKG shows I've had 2; date unknown" (09/19/2016)  . OA (osteoarthritis)    "a little here and there; mainly in the back" (09/19/2016)  . Osteoporosis 05/18/2011  . PAT (paroxysmal atrial tachycardia) ([facility])   . Pectus excavatum 05/18/2011  . Personal history of colonic polyps 10/28/2012  . Pneumonia    "as a child"  . SVT (supraventricular tachycardia) ([facility])   . Urinary frequency    Past Surgical History:  Procedure Laterality Date  . ABDOMINAL HYSTERECTOMY  1988  . BREAST BIOPSY Right 1948   "it was ok"  . CARDIAC CATHETERIZATION    . CARDIOVASCULAR STRESS TEST  03/08/2009   EF 84%  . CATARACT EXTRACTION W/ INTRAOCULAR LENS  IMPLANT, BILATERAL Bilateral   . KNEE ARTHROSCOPY Right   . RIGHT/LEFT HEART CATH AND CORONARY ANGIOGRAPHY N/A 07/25/2016   Procedure: Right/Left Heart Cath and Coronary Angiography;  Surgeon: Kathleene Hazelhristopher D McAlhany, MD;  [facility];  Service: Cardiovascular;  Laterality: N/A;  . SVT ABLATION  09/19/2016  . SVT ABLATION N/A 09/19/2016   Procedure: SVT Ablation;  Surgeon: Hillis RangeJames Allred, MD;  [facility];  Service: Cardiovascular;  Laterality: N/A;  . TONSILLECTOMY AND ADENOIDECTOMY    . US ECHOCARDIOGRAPHY  01/17/2003   EF 55-60%   Family History  Problem Relation Age of Onset  . Heart  failure Mother   . Arthritis Mother   . Kidney failure Father   . Heart disease Father   . Hypertension Father    Social History   Occupational History  . Retired Designer, jewelleryegistered Nurse    Social History Main Topics  . Smoking status: Former Smoker    Packs/day: 1.50    Years: 36.00    Types: Cigarettes    Start date: 03/01/1959    Quit date: 06/10/1994  . Smokeless tobacco: Never Used     Comment: smoked 1- 1.5 ppd.   . Alcohol use 0.0 oz/week     Comment: 09/19/2016 "quit drinking in ~ 1978"  . Drug use: No  . Sexual activity:  Not on file    Tobacco Counseling Counseling given: Not Answered   Activities of Daily Living In your present state of health, do you have any difficulty performing the following activities: 12/25/2016 09/19/2016  Hearing? N -  Vision? N -  Difficulty concentrating or making decisions? N -  Walking or climbing stairs? N -  Dressing or bathing? N -  Doing errands, shopping? N N  Preparing Food and eating ? N -  Using the Toilet? N -  In the past six months, have you accidently leaked urine? N -  Do you have problems with loss of bowel control? N -  Managing your Medications? N -  Managing your Finances? N -  Housekeeping or managing your Housekeeping? N -  Some recent data might be hidden    Immunizations and Health Maintenance Immunization History  Administered Date(s) Administered  . Influenza, High Dose Seasonal PF 05/08/2016  . Influenza, Seasonal, Injecte, Preservative Fre 03/10/2013  . Influenza-Unspecified 03/10/2014, 03/11/2015  . Pneumococcal Conjugate-13 05/10/2005, 06/30/2013  . Pneumococcal Polysaccharide-23 06/10/1996  . Tetanus 10/28/2012  . Zoster 05/10/2008   There are no preventive care reminders to display for this patient.  Patient Care Team: Corwin Levins, MD as PCP - General (Internal Medicine) Cassell Clement, MD as Consulting Physician (Cardiology) Roslynn Amble, MD as Consulting Physician (Pulmonary  Disease) Hillis Range, MD as Consulting Physician (Cardiology) Kathleene Hazel, MD as Consulting Physician (Cardiology)  Indicate any recent Medical Services you may have received from other than [facility] providers in the past year (date may be approximate).     Assessment:   This is a routine wellness examination for Time. Physical assessment deferred to PCP.   Hearing/Vision screen Hearing Screening Comments: Able to hear conversational tones w/o difficulty. No issues reported.  Passed whisper test  Vision Screening Comments: Wears glasses  Dietary issues and exercise activities discussed: Current Exercise Habits: The patient does not participate in regular exercise at present (active doing house work and some yard work), Exercise limited by: cardiac condition(s)  Diet (meal preparation, eat out, water intake, caffeinated beverages, dairy products, fruits and vegetables): in general, a "healthy" diet  , well balanced, low fat/ cholesterol, low salt Reports losing weight.  Discussed eating small frequent meals, supplementing with ensure (coupons given), encouraged patient to increase daily water intake.    Goals    . Maintian current health status          Continue to be as active and independent as possible      Depression Screen PHQ 2/9 Scores 12/25/2016 09/11/2016 08/02/2015 06/30/2013 04/28/2013  PHQ - 2 Score 1 0 0 0 1  PHQ- 9 Score 4 - - - -    Fall Risk Fall Risk  12/25/2016 09/11/2016 08/02/2015 06/30/2013 04/28/2013  Falls in the past year? No No No No Yes  Number falls in past yr: - - - - 1  Injury with Fall? - - - - No  Risk for fall due to : Impaired mobility;Impaired balance/gait - - - -    Cognitive Function: MMSE - Mini Mental State Exam 12/25/2016  Orientation to time 5  Orientation to Place 5  Registration 3  Attention/ Calculation 5  Recall 2  Language- name 2 objects 2  Language- repeat 1  Language- follow 3 step command 3  Language- read &  follow direction 1  Write a sentence 1  Copy design 1  Total score 29  Screening Tests Health Maintenance  Topic Date Due  . INFLUENZA VACCINE  01/08/2017  . TETANUS/TDAP  10/29/2022  . DEXA SCAN  Completed  . PNA vac Low Risk Adult  Completed      Plan:    Continue doing brain stimulating activities (puzzles, reading, adult coloring books, staying active) to keep memory sharp.   Continue to eat heart healthy diet (full of fruits, vegetables, whole grains, lean protein, water--limit salt, fat, and sugar intake) and increase physical activity as tolerated.  I have personally reviewed and noted the following in the patient's chart:   . Medical and social history . Use of alcohol, tobacco or illicit drugs  . Current medications and supplements . Functional ability and status . Nutritional status . Physical activity . Advanced directives . List of other physicians . Vitals . Screenings to include cognitive, depression, and falls . Referrals and appointments  In addition, I have reviewed and discussed with patient certain preventive protocols, quality metrics, and best practice recommendations. A written personalized care plan for preventive services as well as general preventive health recommendations were provided to patient.     Wanda Plump, RN   12/25/2016   Medical screening examination/treatment/procedure(s) were performed by non-physician practitioner and as supervising physician I was immediately available for consultation/collaboration. I agree with above. Oliver Barre, MD

## 2016-12-24 NOTE — Progress Notes (Signed)
Pre visit review using our clinic review tool, if applicable. No additional management support is needed unless otherwise documented below in the visit note. 

## 2016-12-25 ENCOUNTER — Ambulatory Visit (INDEPENDENT_AMBULATORY_CARE_PROVIDER_SITE_OTHER): Payer: Medicare Other | Admitting: *Deleted

## 2016-12-25 VITALS — BP 136/72 | HR 72 | Resp 20 | Ht 63.0 in | Wt 137.0 lb

## 2016-12-25 DIAGNOSIS — Z0000 Encounter for general adult medical examination without abnormal findings: Secondary | ICD-10-CM

## 2016-12-25 DIAGNOSIS — F4329 Adjustment disorder with other symptoms: Secondary | ICD-10-CM | POA: Diagnosis not present

## 2016-12-25 NOTE — Patient Instructions (Signed)
Continue doing brain stimulating activities (puzzles, reading, adult coloring books, staying active) to keep memory sharp.   Continue to eat heart healthy diet (full of fruits, vegetables, whole grains, lean protein, water--limit salt, fat, and sugar intake) and increase physical activity as tolerated.   Alexandra Reyes , Thank you for taking time to come for your Medicare Wellness Visit. I appreciate your ongoing commitment to your health goals. Please review the following plan we discussed and let me know if I can assist you in the future.   These are the goals we discussed: Goals    . Maintian current health status          Continue to be as active and independent as possible       This is a list of the screening recommended for you and due dates:  Health Maintenance  Topic Date Due  . Flu Shot  01/08/2017  . Tetanus Vaccine  10/29/2022  . DEXA scan (bone density measurement)  Completed  . Pneumonia vaccines  Completed

## 2017-01-03 DIAGNOSIS — H353211 Exudative age-related macular degeneration, right eye, with active choroidal neovascularization: Secondary | ICD-10-CM | POA: Diagnosis not present

## 2017-01-03 DIAGNOSIS — H43813 Vitreous degeneration, bilateral: Secondary | ICD-10-CM | POA: Diagnosis not present

## 2017-01-03 DIAGNOSIS — H353222 Exudative age-related macular degeneration, left eye, with inactive choroidal neovascularization: Secondary | ICD-10-CM | POA: Diagnosis not present

## 2017-01-03 DIAGNOSIS — H35371 Puckering of macula, right eye: Secondary | ICD-10-CM | POA: Diagnosis not present

## 2017-01-06 ENCOUNTER — Encounter (INDEPENDENT_AMBULATORY_CARE_PROVIDER_SITE_OTHER): Payer: Medicare Other

## 2017-01-06 ENCOUNTER — Ambulatory Visit (INDEPENDENT_AMBULATORY_CARE_PROVIDER_SITE_OTHER): Payer: Medicare Other | Admitting: Internal Medicine

## 2017-01-06 ENCOUNTER — Telehealth: Payer: Self-pay | Admitting: Internal Medicine

## 2017-01-06 ENCOUNTER — Ambulatory Visit: Payer: Medicare Other

## 2017-01-06 VITALS — BP 138/70 | HR 80 | Ht 63.0 in | Wt 133.0 lb

## 2017-01-06 DIAGNOSIS — I471 Supraventricular tachycardia: Secondary | ICD-10-CM

## 2017-01-06 DIAGNOSIS — I480 Paroxysmal atrial fibrillation: Secondary | ICD-10-CM | POA: Diagnosis not present

## 2017-01-06 DIAGNOSIS — R002 Palpitations: Secondary | ICD-10-CM

## 2017-01-06 DIAGNOSIS — I70213 Atherosclerosis of native arteries of extremities with intermittent claudication, bilateral legs: Secondary | ICD-10-CM

## 2017-01-06 NOTE — Telephone Encounter (Signed)
Reviewed with Dr. [name] and he would like pt to come in for EKG this afternoon. I spoke with pt and gave her this information.  I asked her to have someone drive her here.

## 2017-01-06 NOTE — Progress Notes (Signed)
PCP: Alexandra Reyes, Alexandra Arpin W, MD Primary Cardiologist: Dr Alexandra FuSkains  Alexandra SpikeFlorence C Reyes is a 83 y.o. female who presents today for walk in EP follow-up.  Over the past few days, she has noticed tachypalpitations.  She is concerned that she may have recurrence of SVT.  She reports heart rates 120s-130s.  Today, she denies symptoms of chest pain, shortness of breath,  lower extremity edema, dizziness, presyncope, or syncope.  The patient is otherwise without complaint today.   Past Medical History:  Diagnosis Date  . Age-related macular degeneration, wet, both eyes (HCC)   . Alcohol dependence in remission (HCC) 05/22/2011  . Allergic rhinitis, cause unspecified 05/22/2011  . Allergy   . Anemia, iron deficiency 05/18/2011  . Cholelithiasis   . COPD (chronic obstructive pulmonary disease) (HCC)   . DDD (degenerative disc disease), lumbar 05/18/2011  . Depression    "@ times" (09/19/2016)  . First degree AV block   . GERD (gastroesophageal reflux disease)   . Hiatal hernia   . History of blood transfusion    "3 or 4 related to childbirth"  . HTN (hypertension) 05/18/2011   pt denies this hx on 09/19/2016  . Hyperlipidemia 05/18/2011  . Macular degeneration   . Myocardial infarction (HCC)    "EKG shows I've had 2; date unknown" (09/19/2016)  . OA (osteoarthritis)    "a little here and there; mainly in the back" (09/19/2016)  . Osteoporosis 05/18/2011  . PAT (paroxysmal atrial tachycardia) (HCC)   . Pectus excavatum 05/18/2011  . Personal history of colonic polyps 10/28/2012  . Pneumonia    "as a child"  . SVT (supraventricular tachycardia) (HCC)   . Urinary frequency    Past Surgical History:  Procedure Laterality Date  . ABDOMINAL HYSTERECTOMY  1988  . BREAST BIOPSY Right 1948   "it was ok"  . CARDIAC CATHETERIZATION    . CARDIOVASCULAR STRESS TEST  03/08/2009   EF 84%  . CATARACT EXTRACTION Reyes/ INTRAOCULAR LENS  IMPLANT, BILATERAL Bilateral   . KNEE ARTHROSCOPY Right   . RIGHT/LEFT HEART CATH  AND CORONARY ANGIOGRAPHY N/A 07/25/2016   Procedure: Right/Left Heart Cath and Coronary Angiography;  Surgeon: Kathleene Hazelhristopher D McAlhany, MD;  [facility];  Service: Cardiovascular;  Laterality: N/A;  . SVT ABLATION  09/19/2016  . SVT ABLATION N/A 09/19/2016   Procedure: SVT Ablation;  Surgeon: Alexandra RangeJames Jacobie Stamey, MD;  [facility];  Service: Cardiovascular;  Laterality: N/A;  . TONSILLECTOMY AND ADENOIDECTOMY    . US ECHOCARDIOGRAPHY  01/17/2003   EF 55-60%    ROS- all systems are reviewed and negatives except as per HPI above  Current Outpatient Prescriptions  Medication Sig Dispense Refill  . acetaminophen (TYLENOL) 500 MG tablet Take 1,000 mg by mouth 2 (two) times daily as needed for moderate pain or headache.    . ALPRAZolam (XANAX) 0.25 MG tablet TAKE 1 TABLET BY MOUTH AT BEDTIME AS NEEDED FOR ANXIETY 30 tablet 5  . amLODipine (NORVASC) 5 MG tablet Take 1 tablet (5 mg total) by mouth daily. 90 tablet 3  . Ascorbic Acid (VITAMIN C PO) Take 1 tablet by mouth daily. Reported on 08/08/2015    . aspirin 81 MG tablet Take 81 mg by mouth at bedtime.     . B Complex Vitamins (VITAMIN B COMPLEX PO) Take 1 tablet by mouth daily.     . calcium carbonate (OS-CAL) 600 MG tablet Take 600 mg by mouth daily.    . calcium carbonate (  TUMS - DOSED IN MG ELEMENTAL CALCIUM) 500 MG chewable tablet Chew 3 tablets by mouth daily as needed for indigestion or heartburn.    . docusate sodium (COLACE) 100 MG capsule Take 100 mg by mouth as needed for mild constipation (Take as directed).     . ferrous sulfate 325 (65 FE) MG tablet Take 325 mg by mouth daily.     . furosemide (LASIX) 40 MG tablet TAKE 1 TABLET (40 MG TOTAL) BY MOUTH DAILY. 90 tablet 2  . Glucosamine HCl-MSM (GLUCOSAMINE-MSM PO) Take 2 tablets by mouth daily.     . nitroGLYCERIN (NITROSTAT) 0.4 MG SL tablet Place 1 tablet (0.4 mg total) under the tongue every 5 (five) minutes as needed for chest pain. 25 tablet 3  . omeprazole  (PRILOSEC) 40 MG capsule TAKE ONE CAPSULE BY MOUTH DAILY 30 capsule 3  . PRESCRIPTION MEDICATION Apply 1 Dose to eye every 3 (three) months. Eylea - eye injection    . simvastatin (ZOCOR) 10 MG tablet Take 1 tablet (10 mg total) by mouth daily at 6 PM. 90 tablet 3  . SYMBICORT 80-4.5 MCG/ACT inhaler INHALE ONE PUFF BY MOUTH TWICE A DAY 1 Inhaler 1  . tobramycin (TOBREX) 0.3 % ophthalmic solution Place 1 drop into the right eye 4 (four) times daily. Use the day before, the day of, and the day after eye injections    . traMADol (ULTRAM) 50 MG tablet Take 1 tablet (50 mg total) by mouth every 6 (six) hours as needed for moderate pain. 90 tablet 0  . triamcinolone cream (KENALOG) 0.1 % Apply 1 application topically daily as needed (hives). Apply to affected area     . vitamin E 200 UNIT capsule Take 200 Units by mouth daily.     No current facility-administered medications for this visit.     Physical Exam: Vitals:   01/06/17 1621  BP: 138/70  Pulse: 80  Weight: 133 lb (60.3 kg)  Height: 5\' 3"  (1.6 m)    GEN- The patient is well appearing, alert and oriented x 3 today.   Head- normocephalic, atraumatic Eyes-  Sclera clear, conjunctiva pink Ears- hearing intact Oropharynx- clear Lungs- Clear to ausculation bilaterally, normal work of breathing Heart- Regular rate and rhythm, no murmurs, rubs or gallops, PMI not laterally displaced GI- soft, NT, ND, + BS Extremities- no clubbing, cyanosis, or edema  EKG tracing ordered today is personally reviewed and shows sinus rhythm with PACs, poor r wave progression, inferior infarct pattern  Assessment and Plan:  1. Palpitations Unclear etiology Will proceed with 30 day monitor for better characterization Will need to see if she has recurrence of SVT or a new arrhythmia  2. HTN Stable No change required today  Follow-up with me in August as scheduled  Alexandra RangeJames Broadus Costilla MD, [facility] 01/06/2017 5:18 PM

## 2017-01-06 NOTE — Telephone Encounter (Signed)
Patient calling states that she is in tachycardia.

## 2017-01-06 NOTE — Telephone Encounter (Signed)
I spoke with pt. She reports she had ablation on 09/19/16. Cardizem was stopped in May.  She reports she has been doing well until this past Saturday.  On Saturday had episode of tachycardia when cleaning bathroom. Went away with rest. On Sunday had 2 episodes of tachycardia. Improved after taking Cardizem 30 mg. Today around noon she developed tachycardia when doing laundry.  BP 141/90, pulse 130. Took cardizem 30 mg. At 1:10 rate was 125 when she got up to go to bathroom so she took cardizem 30 mg. BP 142/96. Around 2:00 heart rate was 120 so she took another 30 mg cardizem.  She is not having any shortness of breath but is aware of fast heart rate with activity. She states rate feels irregular and she does not recall it being irregular in the past. Had injection in eye on Friday for macular degeneration.  She has had these before.  Is on prednisone eye gtts for 2 days before and 4 days after injection.

## 2017-01-06 NOTE — Patient Instructions (Signed)
Medication Instructions:  Your physician recommends that you continue on your current medications as directed. Please refer to the Current Medication list given to you today.   Labwork: none  Testing/Procedures: Your physician has recommended that you wear an event monitor. Event monitors are medical devices that record the heart's electrical activity. Doctors most often us these monitors to diagnose arrhythmias. Arrhythmias are problems with the speed or rhythm of the heartbeat. The monitor is a small, portable device. You can wear one while you do your normal daily activities. This is usually used to diagnose what is causing palpitations/syncope (passing out).    Follow-Up: Follow up with Dr. [name] as already planned.   Any Other Special Instructions Will Be Listed Below (If Applicable).     If you need a refill on your cardiac medications before your next appointment, please call your pharmacy.

## 2017-01-07 ENCOUNTER — Telehealth: Payer: Self-pay | Admitting: Internal Medicine

## 2017-01-07 MED ORDER — DILTIAZEM HCL 30 MG PO TABS: 30.0000 mg | ORAL_TABLET | Freq: Four times a day (QID) | ORAL | 3 refills | Status: DC | PRN

## 2017-01-07 NOTE — Telephone Encounter (Signed)
Alexandra Reyes( [facility]) [name] states that she is supposed to taking 30mg  of Diltiazem , but they received a fax from the office saying that she is no longer taking Diltiazem. Please call to Clarify . Thanks

## 2017-01-07 NOTE — Telephone Encounter (Signed)
Spoke with patient who states she was previously advised to stop cardizem but when she saw Dr. [name] yesterday, he advised that she should take Cardizem 30 mg up to 4 times per day for tachycardia. I advised that [facility]. She thanked me for the call.  I [facility]phanie [facility] and advised she call back with questions or concerns. P

## 2017-01-08 MED ORDER — DILTIAZEM HCL 30 MG PO TABS: 30.0000 mg | ORAL_TABLET | Freq: Four times a day (QID) | ORAL | 11 refills | Status: DC | PRN

## 2017-01-08 NOTE — Telephone Encounter (Signed)
Pt's medication was resent in for the amount that pt is suppose to take, dispensing 120 tablets, with 11 refills. Pt is suppose to take cardizem  30 mg tablet 4 times daily as needed. Confirmation received.

## 2017-01-08 NOTE — Addendum Note (Signed)
Addended by: Demetrios LollBARNARD, CATHY C on: 01/08/2017 08:40 AM   Modules accepted: Orders

## 2017-01-22 DIAGNOSIS — N816 Rectocele: Secondary | ICD-10-CM | POA: Diagnosis not present

## 2017-01-29 ENCOUNTER — Ambulatory Visit (INDEPENDENT_AMBULATORY_CARE_PROVIDER_SITE_OTHER): Payer: Medicare Other | Admitting: Internal Medicine

## 2017-01-29 ENCOUNTER — Encounter: Payer: Self-pay | Admitting: Internal Medicine

## 2017-01-29 VITALS — BP 144/82 | HR 56 | Ht 63.0 in | Wt 137.0 lb

## 2017-01-29 DIAGNOSIS — I10 Essential (primary) hypertension: Secondary | ICD-10-CM

## 2017-01-29 DIAGNOSIS — I471 Supraventricular tachycardia: Secondary | ICD-10-CM

## 2017-01-29 DIAGNOSIS — I70213 Atherosclerosis of native arteries of extremities with intermittent claudication, bilateral legs: Secondary | ICD-10-CM

## 2017-01-29 MED ORDER — DILTIAZEM HCL ER 120 MG PO CP24: 120.0000 mg | ORAL_CAPSULE | Freq: Every day | ORAL | 3 refills | Status: DC

## 2017-01-29 MED ORDER — DILTIAZEM HCL ER COATED BEADS 120 MG PO CP24: 120.0000 mg | ORAL_CAPSULE | Freq: Every day | ORAL | 3 refills | Status: DC

## 2017-01-29 NOTE — Progress Notes (Signed)
PCP: Corwin Levins, MD Primary Cardiologist: Dr Anne Fu Primary EP: Dr Johney Frame  Alexandra Reyes is a 83 y.o. female who presents today for routine electrophysiology followup.  Since last being seen in our clinic, the patient reports doing reasonably well. She is wearing an event monitor (which she does not like).  This has recorded a regular tachycardia (SVT).  Today, she denies symptoms of chest pain, shortness of breath,  lower extremity edema, dizziness, presyncope, or syncope.  The patient is otherwise without complaint today.   Past Medical History:  Diagnosis Date  . Age-related macular degeneration, wet, both eyes (HCC)   . Alcohol dependence in remission (HCC) 05/22/2011  . Allergic rhinitis, cause unspecified 05/22/2011  . Allergy   . Anemia, iron deficiency 05/18/2011  . Cholelithiasis   . COPD (chronic obstructive pulmonary disease) (HCC)   . DDD (degenerative disc disease), lumbar 05/18/2011  . Depression    "@ times" (09/19/2016)  . First degree AV block   . GERD (gastroesophageal reflux disease)   . Hiatal hernia   . History of blood transfusion    "3 or 4 related to childbirth"  . HTN (hypertension) 05/18/2011   pt denies this hx on 09/19/2016  . Hyperlipidemia 05/18/2011  . Macular degeneration   . Myocardial infarction (HCC)    "EKG shows I've had 2; date unknown" (09/19/2016)  . OA (osteoarthritis)    "a little here and there; mainly in the back" (09/19/2016)  . Osteoporosis 05/18/2011  . PAT (paroxysmal atrial tachycardia) (HCC)   . Pectus excavatum 05/18/2011  . Personal history of colonic polyps 10/28/2012  . Pneumonia    "as a child"  . SVT (supraventricular tachycardia) (HCC)   . Urinary frequency    Past Surgical History:  Procedure Laterality Date  . ABDOMINAL HYSTERECTOMY  1988  . BREAST BIOPSY Right 1948   "it was ok"  . CARDIAC CATHETERIZATION    . CARDIOVASCULAR STRESS TEST  03/08/2009   EF 84%  . CATARACT EXTRACTION W/ INTRAOCULAR LENS  IMPLANT,  BILATERAL Bilateral   . KNEE ARTHROSCOPY Right   . RIGHT/LEFT HEART CATH AND CORONARY ANGIOGRAPHY N/A 07/25/2016   Procedure: Right/Left Heart Cath and Coronary Angiography;  Surgeon: Kathleene Hazel, MD;  Location: [facility] INVASIVE CV LAB;  Service: Cardiovascular;  Laterality: N/A;  . SVT ABLATION  09/19/2016  . SVT ABLATION N/A 09/19/2016   Procedure: SVT Ablation;  Surgeon: Hillis Range, MD;  Location: [facility] INVASIVE CV LAB;  Service: Cardiovascular;  Laterality: N/A;  . TONSILLECTOMY AND ADENOIDECTOMY    . US ECHOCARDIOGRAPHY  01/17/2003   EF 55-60%    ROS- all systems are reviewed and negatives except as per HPI above  Current Outpatient Prescriptions  Medication Sig Dispense Refill  . acetaminophen (TYLENOL) 500 MG tablet Take 1,000 mg by mouth 2 (two) times daily as needed for moderate pain or headache.    . ALPRAZolam (XANAX) 0.25 MG tablet TAKE 1 TABLET BY MOUTH AT BEDTIME AS NEEDED FOR ANXIETY 30 tablet 5  . amLODipine (NORVASC) 5 MG tablet Take 1 tablet (5 mg total) by mouth daily. 90 tablet 3  . Ascorbic Acid (VITAMIN C PO) Take 1 tablet by mouth daily. Reported on 08/08/2015    . aspirin 81 MG tablet Take 81 mg by mouth at bedtime.     . B Complex Vitamins (VITAMIN B COMPLEX PO) Take 1 tablet by mouth daily.     . calcium carbonate (OS-CAL) 600 MG tablet Take 600 mg  by mouth daily.    . calcium carbonate (TUMS - DOSED IN MG ELEMENTAL CALCIUM) 500 MG chewable tablet Chew 3 tablets by mouth daily as needed for indigestion or heartburn.    . diltiazem (CARDIZEM) 30 MG tablet Take 1 tablet (30 mg total) by mouth 4 (four) times daily as needed. 120 tablet 11  . docusate sodium (COLACE) 100 MG capsule Take 100 mg by mouth as needed for mild constipation (Take as directed).     . ferrous sulfate 325 (65 FE) MG tablet Take 325 mg by mouth daily.     . furosemide (LASIX) 40 MG tablet TAKE 1 TABLET (40 MG TOTAL) BY MOUTH DAILY. 90 tablet 2  . Glucosamine HCl-MSM (GLUCOSAMINE-MSM PO) Take 2  tablets by mouth daily.     . nitroGLYCERIN (NITROSTAT) 0.4 MG SL tablet Place 1 tablet (0.4 mg total) under the tongue every 5 (five) minutes as needed for chest pain. 25 tablet 3  . omeprazole (PRILOSEC) 40 MG capsule TAKE ONE CAPSULE BY MOUTH DAILY 30 capsule 3  . PRESCRIPTION MEDICATION Apply 1 Dose to eye every 3 (three) months. Eylea - eye injection    . simvastatin (ZOCOR) 10 MG tablet Take 1 tablet (10 mg total) by mouth daily at 6 PM. 90 tablet 3  . SYMBICORT 80-4.5 MCG/ACT inhaler INHALE ONE PUFF BY MOUTH TWICE A DAY 1 Inhaler 1  . tobramycin (TOBREX) 0.3 % ophthalmic solution Place 1 drop into the right eye 4 (four) times daily. Use the day before, the day of, and the day after eye injections    . traMADol (ULTRAM) 50 MG tablet Take 1 tablet (50 mg total) by mouth every 6 (six) hours as needed for moderate pain. 90 tablet 0  . triamcinolone cream (KENALOG) 0.1 % Apply 1 application topically daily as needed (hives). Apply to affected area     . vitamin E 200 UNIT capsule Take 200 Units by mouth daily.     No current facility-administered medications for this visit.     Physical Exam: Vitals:   01/29/17 1238  BP: (!) 144/82  Pulse: (!) 56  Weight: 137 lb (62.1 kg)  Height: 5\' 3"  (1.6 m)    GEN- The patient is well appearing, alert and oriented x 3 today.   Head- normocephalic, atraumatic Eyes-  Sclera clear, conjunctiva pink Ears- hearing intact Oropharynx- clear Lungs- Clear to ausculation bilaterally, normal work of breathing Heart- Regular rate and rhythm, no murmurs, rubs or gallops, PMI not laterally displaced GI- soft, NT, ND, + BS Extremities- no clubbing, cyanosis, or edema  She is still wearing her event monitor.  Initially, she appears to have SVT documented (mid RP).  Assessment and Plan:  1. SVT Therapeutic strategies for supraventricular tachycardia including medicine (diltiazem, flecainide) and repeat ablation were discussed in detail with the patient  today. She would like to avoid ablation currently.  Her event monitor is suggestive of atrial tachycardia, though this could also be atypical AVNRT. Will restart diltiazem 120mg  daily.  Return in 2 weeks.  If still having episodes, would either start flecainide or reconsider ablation then.  2. HTN Stable No change required today   Hillis Range MD, [facility] 01/29/2017 12:52 PM

## 2017-01-29 NOTE — Patient Instructions (Addendum)
Medication Instructions:  Your physician has recommended you make the following change in your medication:  1) Start Diltiazem 120 mg daily   Labwork: None ordered   Testing/Procedures: None ordered   Follow-Up: Your physician recommends that you schedule a follow-up appointment in: 2 weeks with Dr Johney Frame   Thank you for choosing [facility]!!     Dennis Bast, RN [phone]

## 2017-01-30 ENCOUNTER — Telehealth: Payer: Self-pay | Admitting: *Deleted

## 2017-01-30 NOTE — Telephone Encounter (Signed)
Prior authorization for DILTIAZEM [facility] [facility] BEADS sent to [facility] through covermymeds.

## 2017-01-30 NOTE — Telephone Encounter (Signed)
Received a DENIAL letter for prior authorization for DILTIAZEM/CARDIZEM CD, I have completed appeal forms and faxed to [employer].

## 2017-02-04 DIAGNOSIS — I471 Supraventricular tachycardia: Secondary | ICD-10-CM | POA: Diagnosis not present

## 2017-02-04 DIAGNOSIS — R002 Palpitations: Secondary | ICD-10-CM

## 2017-02-05 DIAGNOSIS — F4329 Adjustment disorder with other symptoms: Secondary | ICD-10-CM | POA: Diagnosis not present

## 2017-02-12 ENCOUNTER — Ambulatory Visit (INDEPENDENT_AMBULATORY_CARE_PROVIDER_SITE_OTHER): Payer: Medicare Other | Admitting: Internal Medicine

## 2017-02-12 ENCOUNTER — Encounter: Payer: Self-pay | Admitting: Internal Medicine

## 2017-02-12 VITALS — BP 146/80 | HR 78 | Ht 63.0 in | Wt 137.8 lb

## 2017-02-12 DIAGNOSIS — I471 Supraventricular tachycardia: Secondary | ICD-10-CM | POA: Diagnosis not present

## 2017-02-12 DIAGNOSIS — I70213 Atherosclerosis of native arteries of extremities with intermittent claudication, bilateral legs: Secondary | ICD-10-CM

## 2017-02-12 NOTE — Progress Notes (Signed)
PCP: [name], Tashawnda Bleiler W, MD Primary Cardiologist: Dr Anne FuSkains Primary EP: Dr [name]  [name] is a 83 y.o. female who presents today for routine electrophysiology followup.  Since last being seen in our clinic, the patient reports doing very well.  Her SVT is currently well controlled with cardizem.  She continues to have stable chest wall pain.  Today, she denies symptoms of exertional chest pain, shortness of breath,  lower extremity edema, dizziness, presyncope, or syncope.  The patient is otherwise without complaint today.   Past Medical History:  Diagnosis Date  . Age-related macular degeneration, wet, both eyes (HCC)   . Alcohol dependence in remission (HCC) 05/22/2011  . Allergic rhinitis, cause unspecified 05/22/2011  . Allergy   . Anemia, iron deficiency 05/18/2011  . Cholelithiasis   . COPD (chronic obstructive pulmonary disease) (HCC)   . DDD (degenerative disc disease), lumbar 05/18/2011  . Depression    "@ times" (09/19/2016)  . First degree AV block   . GERD (gastroesophageal reflux disease)   . Hiatal hernia   . History of blood transfusion    "3 or 4 related to childbirth"  . HTN (hypertension) 05/18/2011   pt denies this hx on 09/19/2016  . Hyperlipidemia 05/18/2011  . Macular degeneration   . Myocardial infarction (HCC)    "EKG shows I've had 2; date unknown" (09/19/2016)  . OA (osteoarthritis)    "a little here and there; mainly in the back" (09/19/2016)  . Osteoporosis 05/18/2011  . PAT (paroxysmal atrial tachycardia) (HCC)   . Pectus excavatum 05/18/2011  . Personal history of colonic polyps 10/28/2012  . Pneumonia    "as a child"  . SVT (supraventricular tachycardia) (HCC)   . Urinary frequency    Past Surgical History:  Procedure Laterality Date  . ABDOMINAL HYSTERECTOMY  1988  . BREAST BIOPSY Right 1948   "it was ok"  . CARDIAC CATHETERIZATION    . CARDIOVASCULAR STRESS TEST  03/08/2009   EF 84%  . CATARACT EXTRACTION W/ INTRAOCULAR LENS  IMPLANT,  BILATERAL Bilateral   . KNEE ARTHROSCOPY Right   . RIGHT/LEFT HEART CATH AND CORONARY ANGIOGRAPHY N/A 07/25/2016   Procedure: Right/Left Heart Cath and Coronary Angiography;  Surgeon: Kathleene Hazelhristopher D McAlhany, MD;  [facility];  Service: Cardiovascular;  Laterality: N/A;  . SVT ABLATION  09/19/2016  . SVT ABLATION N/A 09/19/2016   Procedure: SVT Ablation;  Surgeon: Hillis RangeJames Elaria Osias, MD;  [facility];  Service: Cardiovascular;  Laterality: N/A;  . TONSILLECTOMY AND ADENOIDECTOMY    . US ECHOCARDIOGRAPHY  01/17/2003   EF 55-60%    ROS- all systems are reviewed and negatives except as per HPI above  Current Outpatient Prescriptions  Medication Sig Dispense Refill  . acetaminophen (TYLENOL) 500 MG tablet Take 1,000 mg by mouth 2 (two) times daily as needed for moderate pain or headache.    . ALPRAZolam (XANAX) 0.25 MG tablet TAKE 1 TABLET BY MOUTH AT BEDTIME AS NEEDED FOR ANXIETY 30 tablet 5  . amLODipine (NORVASC) 5 MG tablet Take 1 tablet (5 mg total) by mouth daily. 90 tablet 3  . Ascorbic Acid (VITAMIN C PO) Take 1 tablet by mouth daily. Reported on 08/08/2015    . aspirin 81 MG tablet Take 81 mg by mouth at bedtime.     . B Complex Vitamins (VITAMIN B COMPLEX PO) Take 1 tablet by mouth daily.     . calcium carbonate (OS-CAL) 600 MG tablet Take 600 mg  by mouth daily.    . calcium carbonate (TUMS - DOSED IN MG ELEMENTAL CALCIUM) 500 MG chewable tablet Chew 3 tablets by mouth daily as needed for indigestion or heartburn.    . diltiazem (CARDIZEM CD) 120 MG 24 hr capsule Take 1 capsule (120 mg total) by mouth daily. 90 capsule 3  . diltiazem (CARDIZEM) 30 MG tablet Take 1 tablet (30 mg total) by mouth 4 (four) times daily as needed. 120 tablet 11  . docusate sodium (COLACE) 100 MG capsule Take 100 mg by mouth as needed for mild constipation (Take as directed).     . ferrous sulfate 325 (65 FE) MG tablet Take 325 mg by mouth daily.     . furosemide (LASIX) 40 MG tablet  TAKE 1 TABLET (40 MG TOTAL) BY MOUTH DAILY. 90 tablet 2  . Glucosamine HCl-MSM (GLUCOSAMINE-MSM PO) Take 2 tablets by mouth daily.     . nitroGLYCERIN (NITROSTAT) 0.4 MG SL tablet Place 1 tablet (0.4 mg total) under the tongue every 5 (five) minutes as needed for chest pain. 25 tablet 3  . omeprazole (PRILOSEC) 40 MG capsule TAKE ONE CAPSULE BY MOUTH DAILY 30 capsule 3  . PRESCRIPTION MEDICATION Apply 1 Dose to eye every 3 (three) months. Eylea - eye injection    . simvastatin (ZOCOR) 10 MG tablet Take 1 tablet (10 mg total) by mouth daily at 6 PM. 90 tablet 3  . SYMBICORT 80-4.5 MCG/ACT inhaler INHALE ONE PUFF BY MOUTH TWICE A DAY 1 Inhaler 1  . tobramycin (TOBREX) 0.3 % ophthalmic solution Place 1 drop into the right eye 4 (four) times daily. Use the day before, the day of, and the day after eye injections    . traMADol (ULTRAM) 50 MG tablet Take 1 tablet (50 mg total) by mouth every 6 (six) hours as needed for moderate pain. 90 tablet 0  . triamcinolone cream (KENALOG) 0.1 % Apply 1 application topically daily as needed (hives). Apply to affected area     . vitamin E 200 UNIT capsule Take 200 Units by mouth daily.     No current facility-administered medications for this visit.     Physical Exam: Vitals:   02/12/17 1151  BP: (!) 146/80  Pulse: 78  SpO2: 96%  Weight: 137 lb 12.8 oz (62.5 kg)  Height: 5\' 3"  (1.6 m)    GEN- The patient is well appearing, alert and oriented x 3 today.   Head- normocephalic, atraumatic Eyes-  Sclera clear, conjunctiva pink Ears- hearing intact Oropharynx- clear Lungs- Clear to ausculation bilaterally, normal work of breathing Heart- Regular rate and rhythm, no murmurs, rubs or gallops, PMI not laterally displaced GI- soft, NT, ND, + BS Extremities- no clubbing, cyanosis, or edema  EKG tracing ordered today is personally reviewed and shows sinus rhythm 80 bpm, LAD, inferior infarct pattern, anteroseptal infarct pattern  Assessment and Plan:  1.  SVT Stable No change required today  2. HTN Stable No change required today  Return to see me in 3 months  Hillis Range MD, [facility] 02/12/2017 12:13 PM

## 2017-02-12 NOTE — Patient Instructions (Signed)

## 2017-02-13 ENCOUNTER — Other Ambulatory Visit: Payer: Self-pay | Admitting: Internal Medicine

## 2017-02-13 NOTE — Telephone Encounter (Signed)
faxed

## 2017-02-13 NOTE — Telephone Encounter (Signed)
Done hardcopy to Shirron  

## 2017-02-17 ENCOUNTER — Telehealth: Payer: Self-pay

## 2017-02-17 NOTE — Telephone Encounter (Signed)
[facility] replying to our appeal of Diltiazem denial. They have decided to now approve it through 06/09/2017. Case # [id number].

## 2017-03-04 NOTE — Progress Notes (Signed)
Subjective:    Patient ID: Alexandra Reyes, female    DOB: 05-Feb-1926, 83 y.o.   MRN: [id number]  C.C.:  Follow-up for ILD, Atypical Chest Pain, & GERD.  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 Serologic workup negative. Not currently on immunosuppression/treatment. She reports dyspnea only with her palpitations. No new cough. Continuing to use Symbicort as prescribed.   Atypical chest pain: Previously responded to prednisone therapy. Previously also was some pleuritic component. Relieved with Ultram. Recommended investigation of massage therapy at last appointment & deferred referral to pain specialist. She has been using the Tramadol up to two times daily to help with her chest pain. Pain is still relieved with rest. She still reports the pain is a "pressure" and relieved with laying more recumbent. She reports her pain was worse after she tried massage therapy. She reports her pain is worse on the right side but does cross the midpoint with continued activity. She has also used moist heat for some relief. She reports it took her a week to recover. She is also taking Tylenol intermittent for some relief as well.   GERD: Previously utilizing Tums intermittently. Previously evaluated by GI. No reflux or dyspepsia.  Review of Systems She reports she had intermittent palpitations since last appointment. She has restarted her Diltiazem. She reports her tachycardia is better controlled on her current dose of Diltiazem. She reports no progression in her weight loss. No fever or chills. No abdominal pain or nausea.   Allergies  Allergen Reactions  . Symbicort [Budesonide-Formoterol Fumarate]     Jittery in high doses, this has been reduced and is tolerated at this time.     Current Outpatient Prescriptions on File Prior to Visit  Medication Sig Dispense Refill  . acetaminophen (TYLENOL) 500 MG tablet Take 1,000 mg by mouth 2 (two) times daily as needed for moderate  pain or headache.    . ALPRAZolam (XANAX) 0.25 MG tablet TAKE ONE TABLET BY MOUTH AT BEDTIME AS NEEDED FOR ANXIETY 90 tablet 1  . amLODipine (NORVASC) 5 MG tablet Take 1 tablet (5 mg total) by mouth daily. 90 tablet 3  . Ascorbic Acid (VITAMIN C PO) Take 1 tablet by mouth daily. Reported on 08/08/2015    . aspirin 81 MG tablet Take 81 mg by mouth at bedtime.     . B Complex Vitamins (VITAMIN B COMPLEX PO) Take 1 tablet by mouth daily.     . calcium carbonate (OS-CAL) 600 MG tablet Take 600 mg by mouth daily.    . calcium carbonate (TUMS - DOSED IN MG ELEMENTAL CALCIUM) 500 MG chewable tablet Chew 3 tablets by mouth daily as needed for indigestion or heartburn.    . diltiazem (CARDIZEM CD) 120 MG 24 hr capsule Take 1 capsule (120 mg total) by mouth daily. 90 capsule 3  . diltiazem (CARDIZEM) 30 MG tablet Take 1 tablet (30 mg total) by mouth 4 (four) times daily as needed. 120 tablet 11  . docusate sodium (COLACE) 100 MG capsule Take 100 mg by mouth as needed for mild constipation (Take as directed).     . ferrous sulfate 325 (65 FE) MG tablet Take 325 mg by mouth daily.     . furosemide (LASIX) 40 MG tablet TAKE 1 TABLET (40 MG TOTAL) BY MOUTH DAILY. 90 tablet 2  . Glucosamine HCl-MSM (GLUCOSAMINE-MSM PO) Take 2 tablets by mouth daily.     . nitroGLYCERIN (NITROSTAT) 0.4 MG SL tablet Place  1 tablet (0.4 mg total) under the tongue every 5 (five) minutes as needed for chest pain. 25 tablet 3  . omeprazole (PRILOSEC) 40 MG capsule TAKE ONE CAPSULE BY MOUTH DAILY 30 capsule 3  . PRESCRIPTION MEDICATION Apply 1 Dose to eye every 3 (three) months. Eylea - eye injection    . simvastatin (ZOCOR) 10 MG tablet Take 1 tablet (10 mg total) by mouth daily at 6 PM. 90 tablet 3  . SYMBICORT 80-4.5 MCG/ACT inhaler INHALE ONE PUFF BY MOUTH TWICE A DAY 1 Inhaler 1  . tobramycin (TOBREX) 0.3 % ophthalmic solution Place 1 drop into the right eye 4 (four) times daily. Use the day before, the day of, and the day after  eye injections    . traMADol (ULTRAM) 50 MG tablet Take 1 tablet (50 mg total) by mouth every 6 (six) hours as needed for moderate pain. 90 tablet 0  . triamcinolone cream (KENALOG) 0.1 % Apply 1 application topically daily as needed (hives). Apply to affected area     . vitamin E 200 UNIT capsule Take 200 Units by mouth daily.     No current facility-administered medications on file prior to visit.     Past Medical History:  Diagnosis Date  . Age-related macular degeneration, wet, both eyes ([facility])   . Alcohol dependence in remission ([facility]) 05/22/2011  . Allergic rhinitis, cause unspecified 05/22/2011  . Allergy   . Anemia, iron deficiency 05/18/2011  . Cholelithiasis   . COPD (chronic obstructive pulmonary disease) ([facility])   . DDD (degenerative disc disease), lumbar 05/18/2011  . Depression    "@ times" (09/19/2016)  . First degree AV block   . GERD (gastroesophageal reflux disease)   . Hiatal hernia   . History of blood transfusion    "3 or 4 related to childbirth"  . HTN (hypertension) 05/18/2011   pt denies this hx on 09/19/2016  . Hyperlipidemia 05/18/2011  . Macular degeneration   . Myocardial infarction ([facility])    "EKG shows I've had 2; date unknown" (09/19/2016)  . OA (osteoarthritis)    "a little here and there; mainly in the back" (09/19/2016)  . Osteoporosis 05/18/2011  . PAT (paroxysmal atrial tachycardia) ([facility])   . Pectus excavatum 05/18/2011  . Personal history of colonic polyps 10/28/2012  . Pneumonia    "as a child"  . SVT (supraventricular tachycardia) ([facility])   . Urinary frequency     Past Surgical History:  Procedure Laterality Date  . ABDOMINAL HYSTERECTOMY  1988  . BREAST BIOPSY Right 1948   "it was ok"  . CARDIAC CATHETERIZATION    . CARDIOVASCULAR STRESS TEST  03/08/2009   EF 84%  . CATARACT EXTRACTION W/ INTRAOCULAR LENS  IMPLANT, BILATERAL Bilateral   . KNEE ARTHROSCOPY Right   . RIGHT/LEFT HEART CATH AND CORONARY ANGIOGRAPHY N/A 07/25/2016   Procedure:  Right/Left Heart Cath and Coronary Angiography;  Surgeon: Burnell Blanks, MD;  Location: [facility];  Service: Cardiovascular;  Laterality: N/A;  . SVT ABLATION  09/19/2016  . SVT ABLATION N/A 09/19/2016   Procedure: SVT Ablation;  Surgeon: Thompson Grayer, MD;  Location: [facility];  Service: Cardiovascular;  Laterality: N/A;  . TONSILLECTOMY AND ADENOIDECTOMY    . US ECHOCARDIOGRAPHY  01/17/2003   EF 55-60%    Family History  Problem Relation Age of Onset  . Heart failure Mother   . Arthritis Mother   . Kidney failure Father   . Heart disease Father   .  Hypertension Father     Social History   Social History  . Marital status: Widowed    Spouse name: N/A  . Number of children: Y  . Years of education: 75   Occupational History  . Retired Equities trader    Social History Main Topics  . Smoking status: Former Smoker    Packs/day: 1.50    Years: 36.00    Types: Cigarettes    Start date: 03/01/1959    Quit date: 06/10/1994  . Smokeless tobacco: Never Used     Comment: smoked 1- 1.5 ppd.   . Alcohol use 0.0 oz/week     Comment: 09/19/2016 "quit drinking in ~ 1978"  . Drug use: No  . Sexual activity: Not on file   Other Topics Concern  . Not on file   Social History Narrative   Originally from [place], [place]. She moved to [place] in 1950 during the polio epidemic. Previously worked as a Marine scientist. No pets currently. Remote bird exposure. No mold or hot tub exposure.       Objective:   Physical Exam BP 130/78 (BP Location: Left Arm)   Pulse 75   Ht '5\' 3"'  (1.6 m)   Wt 137 lb (62.1 kg)   LMP  (LMP Unknown)   SpO2 94%   BMI 24.27 kg/m   General:  Awake. No distress. Comfortable.  Integument:  No rash on exposed skin. Warm. Dry. Extremities:  No cyanosis or clubbing.  HEENT:  Minimal nasal turbinate swelling. No scleral icterus. No oral ulcers. Cardiovascular:  Regular rate. Compression stockings in place. Regular rhythm.  Pulmonary:  Overall clear with  auscultation despite minimal basilar Velcro crackles. Normal work of breathing on room air. Abdomen: Soft. Normal bowel sounds. Mildly protuberant. Musculoskeletal:  Normal bulk and tone. No chest wall deformity. No joint deformity or effusion appreciated.  PFT 07/12/15: FVC 2.29 L (106%) FEV1 1.57 L (99%) FEV1/FVC 0.68 FEF 25-75 0.78 L (81%) no bronchodilator response TLC 3.59 L (73%) RV 52% ERV 342% DLCO uncorrected 52% 05/14/13: FVC 2.34 L (103%) FEV1 1.63 L (97%) FEV1/FVC 0.69 FEF 25-75 0.83 L (76%) no bronchodilator response TLC 4.51 L (92%) ERV 284% DLCO uncorrected 55%  IMAGING CT CHEST W/O 04/03/16 (previously reviewed by me):  No pleural effusion or thickening. No pathologic mediastinal adenopathy. No pericardial effusion. Basilar predominant subpleural reticular changes that are mild and more pronounced on CT imaging.  CT CHEST W/O 10/09/15 (previously reviewed by me):  No pneumothorax or effusion. Minimal posterior subsegmental atelectasis. No appreciable bronchiectasis, mass, or nodule. No pathologic mediastinal adenopathy. No pericardial effusion. Mild coronary artery calcification.  BARIUM SWALLOW (08/11/15): No evidence for stricture or mass lesion. Moderate esophageal dysmotility without hiatal hernia. Small amount spontaneous reflux into the lower third of the esophagus.  CTA CHEST 08/03/15 (previously reviewed by me): Mild lower lobe predominant atelectasis. No other nodule or opacity appreciated. No ulnar emboli. No pleural effusion or thickening. No pericardial effusion. No pathologic mediastinal adenopathy.  CARDIAC LHC (07/25/16):  Prox RCA lesion, 100 %stenosed.  Prox LAD lesion, 20 %stenosed.  LV end diastolic pressure is normal.   1. 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 The mid and distal vessel fills briskly from left to right collaterals.  2. Mild non-obstructive disease in the LAD 3. Normal filling pressures.  4. N specific ormal PA pressures  TTE  (07/07/15): Narrow LVOT with normal cavity size. Mild LVH. EF 55-60%. Grade 1 diastolic dysfunction. LA & RA normal in size.  RV normal in size and function. No aortic regurgitation. No mitral stenosis or regurgitation. No pulmonic regurgitation. Mild tricuspid regurgitation. No pericardial effusion.  LABS 04/17/16 CRP:  0.2 ESR:  19 ANA:  Negative Anti-CCP:  <16 RF:  <14 SCL-70:  <1.0 Hypersensitivity Pneumonitis Panel:  Negative   08/02/15 CBC: 9.2/14.4/42.7/390 BMP: 133/4.3/98/28/17/1.09/94/9.5 LFT: 4.2/7.0/0.5/62/18/16    Assessment & Plan:  83 y.o. female with underlying ILD and mild restriction based upon previous lung volumes. Patient's main complaint is centered around her atypical right-sided chest pain which seems to be mostly musculoskeletal in etiology. I am recommending she seek treatment from a pain medicine specialist to provide her with some relief; although, her current regimen of Ultram intermixed with Tylenol seems to be reasonably effective. She has no other new breathing problems and remains relatively asymptomatic with regards to her reflux. I instructed the patient to notify my office if she had any new breathing problems before her next appointment.  1. ILD:  Continuing to hold on immunosuppression. Patient continuing on Symbicort given some symptomatic improvement previously noted. 2. Atypical chest pain: Refilled patient's Ultram prescription. Referring patient to pain medicine specialist. 3. GERD: No symptoms. No new medications. 4. Health maintenance: Status post Prevnar January 2015 & Pneumovax January 1998. Recommended influenza vaccine next month. 5. Follow-up: Return to clinic in 3 months or sooner if needed.  Sonia Baller Ashok Cordia, M.D. [facility] Pager:  [phone] chest  After 3pm or if no response, call [phone] 1:56 PM 03/05/17   I'm holding off on further review his

## 2017-03-05 ENCOUNTER — Ambulatory Visit (INDEPENDENT_AMBULATORY_CARE_PROVIDER_SITE_OTHER): Payer: Medicare Other | Admitting: Pulmonary Disease

## 2017-03-05 VITALS — BP 130/78 | HR 75 | Ht 63.0 in | Wt 137.0 lb

## 2017-03-05 DIAGNOSIS — R0789 Other chest pain: Secondary | ICD-10-CM

## 2017-03-05 DIAGNOSIS — I70213 Atherosclerosis of native arteries of extremities with intermittent claudication, bilateral legs: Secondary | ICD-10-CM

## 2017-03-05 DIAGNOSIS — J849 Interstitial pulmonary disease, unspecified: Secondary | ICD-10-CM | POA: Diagnosis not present

## 2017-03-05 MED ORDER — TRAMADOL HCL 50 MG PO TABS: 50.0000 mg | ORAL_TABLET | Freq: Four times a day (QID) | ORAL | 0 refills | Status: DC | PRN

## 2017-03-05 NOTE — Patient Instructions (Signed)
   Please call me if you have any new breathing problems or questions before your next appointment.  We are referring you to a pain specialist to address your chest wall pain.  We will see you back in 3 months or sooner if needed.

## 2017-03-12 ENCOUNTER — Other Ambulatory Visit (INDEPENDENT_AMBULATORY_CARE_PROVIDER_SITE_OTHER): Payer: Medicare Other

## 2017-03-12 ENCOUNTER — Ambulatory Visit (INDEPENDENT_AMBULATORY_CARE_PROVIDER_SITE_OTHER): Payer: Medicare Other | Admitting: Internal Medicine

## 2017-03-12 ENCOUNTER — Encounter: Payer: Self-pay | Admitting: Internal Medicine

## 2017-03-12 VITALS — BP 112/74 | HR 77 | Temp 97.8°F | Ht 63.0 in | Wt 134.0 lb

## 2017-03-12 DIAGNOSIS — F32A Depression, unspecified: Secondary | ICD-10-CM

## 2017-03-12 DIAGNOSIS — N183 Chronic kidney disease, stage 3 unspecified: Secondary | ICD-10-CM

## 2017-03-12 DIAGNOSIS — J449 Chronic obstructive pulmonary disease, unspecified: Secondary | ICD-10-CM | POA: Diagnosis not present

## 2017-03-12 DIAGNOSIS — I70213 Atherosclerosis of native arteries of extremities with intermittent claudication, bilateral legs: Secondary | ICD-10-CM | POA: Diagnosis not present

## 2017-03-12 DIAGNOSIS — I10 Essential (primary) hypertension: Secondary | ICD-10-CM

## 2017-03-12 DIAGNOSIS — E785 Hyperlipidemia, unspecified: Secondary | ICD-10-CM | POA: Diagnosis not present

## 2017-03-12 DIAGNOSIS — F329 Major depressive disorder, single episode, unspecified: Secondary | ICD-10-CM

## 2017-03-12 LAB — BASIC METABOLIC PANEL
BUN: 14 mg/dL (ref 6–23)
CALCIUM: 9.8 mg/dL (ref 8.4–10.5)
CO2: 30 mEq/L (ref 19–32)
CREATININE: 0.83 mg/dL (ref 0.40–1.20)
Chloride: 97 mEq/L (ref 96–112)
GFR: 68.79 mL/min (ref >60.00)
GLUCOSE: 92 mg/dL (ref 70–99)
Potassium: 4.3 mEq/L (ref 3.5–5.1)
SODIUM: 135 meq/L (ref 135–145)

## 2017-03-12 NOTE — Patient Instructions (Addendum)

## 2017-03-12 NOTE — Assessment & Plan Note (Signed)
Chronic stable recently, for f/u lab today

## 2017-03-12 NOTE — Progress Notes (Signed)
Subjective:    Patient ID: Alexandra Reyes, female    DOB: Nov 28, 1925, 83 y.o.   MRN: [id number]  HPI  Here to f/u; overall doing ok,  Pt denies chest pain, increasing sob or doe, wheezing, orthopnea, PND, increased LE swelling, palpitations, dizziness or syncope.  Pt denies new neurological symptoms such as new headache, or facial or extremity weakness or numbness.  Pt denies polydipsia, polyuria,   Pt states overall good compliance with meds, mostly trying to follow appropriate diet, with wt overall stable,  but little exercise however. Declines flu shot now, usually waits until late oct, likely for the [facility], since last yr she got a $25 credit off groceries.  S/p recent cardiac ablation, worked well for about 4 months, now improved back on diltiazem 120 as well as 30 mg prn..  Did not tolerate amiodarone well in past.  May need another ablation eventually per pt per Dr Johney Frame.  Denies worsening depressive symptoms, suicidal ideation, or panic  No other interval hx Past Medical History:  Diagnosis Date  . Age-related macular degeneration, wet, both eyes ([facility])   . Alcohol dependence in remission ([facility]) 05/22/2011  . Allergic rhinitis, cause unspecified 05/22/2011  . Allergy   . Anemia, iron deficiency 05/18/2011  . Cholelithiasis   . COPD (chronic obstructive pulmonary disease) ([facility])   . DDD (degenerative disc disease), lumbar 05/18/2011  . Depression    "@ times" (09/19/2016)  . First degree AV block   . GERD (gastroesophageal reflux disease)   . Hiatal hernia   . History of blood transfusion    "3 or 4 related to childbirth"  . HTN (hypertension) 05/18/2011   pt denies this hx on 09/19/2016  . Hyperlipidemia 05/18/2011  . Macular degeneration   . Myocardial infarction ([facility])    "EKG shows I've had 2; date unknown" (09/19/2016)  . OA (osteoarthritis)    "a little here and there; mainly in the back" (09/19/2016)  . Osteoporosis 05/18/2011  . PAT (paroxysmal atrial tachycardia) ([facility])    . Pectus excavatum 05/18/2011  . Personal history of colonic polyps 10/28/2012  . Pneumonia    "as a child"  . SVT (supraventricular tachycardia) ([facility])   . Urinary frequency    Past Surgical History:  Procedure Laterality Date  . ABDOMINAL HYSTERECTOMY  1988  . BREAST BIOPSY Right 1948   "it was ok"  . CARDIAC CATHETERIZATION    . CARDIOVASCULAR STRESS TEST  03/08/2009   EF 84%  . CATARACT EXTRACTION W/ INTRAOCULAR LENS  IMPLANT, BILATERAL Bilateral   . KNEE ARTHROSCOPY Right   . RIGHT/LEFT HEART CATH AND CORONARY ANGIOGRAPHY N/A 07/25/2016   Procedure: Right/Left Heart Cath and Coronary Angiography;  Surgeon: Kathleene Hazel, MD;  Location: [facility];  Service: Cardiovascular;  Laterality: N/A;  . SVT ABLATION  09/19/2016  . SVT ABLATION N/A 09/19/2016   Procedure: SVT Ablation;  Surgeon: Hillis Range, MD;  Location: [facility] INVASIVE [facility];  Service: Cardiovascular;  Laterality: N/A;  . TONSILLECTOMY AND ADENOIDECTOMY    . US ECHOCARDIOGRAPHY  01/17/2003   EF 55-60%    reports that she quit smoking about 22 years ago. Her smoking use included Cigarettes. She started smoking about 58 years ago. She has a 54.00 pack-year smoking history. She has never used smokeless tobacco. She reports that she drinks alcohol. She reports that she does not use drugs. family history includes Arthritis in her mother; Heart disease in her father; Heart failure in her mother;  Hypertension in her father; Kidney failure in her father. Allergies  Allergen Reactions  . Symbicort [Budesonide-Formoterol Fumarate]     Jittery in high doses, this has been reduced and is tolerated at this time.    Current Outpatient Prescriptions on File Prior to Visit  Medication Sig Dispense Refill  . acetaminophen (TYLENOL) 500 MG tablet Take 1,000 mg by mouth 2 (two) times daily as needed for moderate pain or headache.    . ALPRAZolam (XANAX) 0.25 MG tablet TAKE ONE TABLET BY MOUTH AT BEDTIME AS NEEDED FOR ANXIETY  90 tablet 1  . amLODipine (NORVASC) 5 MG tablet Take 1 tablet (5 mg total) by mouth daily. 90 tablet 3  . Ascorbic Acid (VITAMIN C PO) Take 1 tablet by mouth daily. Reported on 08/08/2015    . aspirin 81 MG tablet Take 81 mg by mouth at bedtime.     . B Complex Vitamins (VITAMIN B COMPLEX PO) Take 1 tablet by mouth daily.     . calcium carbonate (OS-CAL) 600 MG tablet Take 600 mg by mouth daily.    . calcium carbonate (TUMS - DOSED IN MG ELEMENTAL CALCIUM) 500 MG chewable tablet Chew 3 tablets by mouth daily as needed for indigestion or heartburn.    . diltiazem (CARDIZEM CD) 120 MG 24 hr capsule Take 1 capsule (120 mg total) by mouth daily. 90 capsule 3  . diltiazem (CARDIZEM) 30 MG tablet Take 1 tablet (30 mg total) by mouth 4 (four) times daily as needed. 120 tablet 11  . docusate sodium (COLACE) 100 MG capsule Take 100 mg by mouth as needed for mild constipation (Take as directed).     . ferrous sulfate 325 (65 FE) MG tablet Take 325 mg by mouth daily.     . furosemide (LASIX) 40 MG tablet TAKE 1 TABLET (40 MG TOTAL) BY MOUTH DAILY. 90 tablet 2  . Glucosamine HCl-MSM (GLUCOSAMINE-MSM PO) Take 2 tablets by mouth daily.     . nitroGLYCERIN (NITROSTAT) 0.4 MG SL tablet Place 1 tablet (0.4 mg total) under the tongue every 5 (five) minutes as needed for chest pain. 25 tablet 3  . omeprazole (PRILOSEC) 40 MG capsule TAKE ONE CAPSULE BY MOUTH DAILY 30 capsule 3  . PRESCRIPTION MEDICATION Apply 1 Dose to eye every 3 (three) months. Eylea - eye injection    . simvastatin (ZOCOR) 10 MG tablet Take 1 tablet (10 mg total) by mouth daily at 6 PM. 90 tablet 3  . SYMBICORT 80-4.5 MCG/ACT inhaler INHALE ONE PUFF BY MOUTH TWICE A DAY 1 Inhaler 1  . tobramycin (TOBREX) 0.3 % ophthalmic solution Place 1 drop into the right eye 4 (four) times daily. Use the day before, the day of, and the day after eye injections    . traMADol (ULTRAM) 50 MG tablet Take 1 tablet (50 mg total) by mouth every 6 (six) hours as needed  for moderate pain. 90 tablet 0  . triamcinolone cream (KENALOG) 0.1 % Apply 1 application topically daily as needed (hives). Apply to affected area     . vitamin E 200 UNIT capsule Take 200 Units by mouth daily.     No current facility-administered medications on file prior to visit.    Review of Systems  Constitutional: Negative for other unusual diaphoresis or sweats HENT: Negative for ear discharge or swelling Eyes: Negative for other worsening visual disturbances Respiratory: Negative for stridor or other swelling  Gastrointestinal: Negative for worsening distension or other blood Genitourinary: Negative for retention or  other urinary change Musculoskeletal: Negative for other MSK pain or swelling Skin: Negative for color change or other new lesions Neurological: Negative for worsening tremors and other numbness  Psychiatric/Behavioral: Negative for worsening agitation or other fatigue All other system neg per pt    Objective:   Physical Exam BP 112/74   Pulse 77   Temp 97.8 F (36.6 C) (Oral)   Ht  (1.6 m)   Wt 134 lb (60.8 kg)   LMP  (LMP Unknown)   SpO2 97%   BMI 23.74 kg/m  VS noted, not ill appaering Constitutional: Pt appears in NAD HENT: Head: NCAT.  Right Ear: External ear normal.  Left Ear: External ear normal.  Eyes: . Pupils are equal, round, and reactive to light. Conjunctivae and EOM are normal Nose: without d/c or deformity Neck: Neck supple. Gross normal ROM Cardiovascular: Normal rate and regular rhythm.   Pulmonary/Chest: Effort normal and breath sounds without rales or wheezing.  Abd:  Soft, NT, ND, + BS, no organomegaly Neurological: Pt is alert. At baseline orientation, motor grossly intact Skin: Skin is warm. No rashes, other new lesions, no LE edema Psychiatric: Pt behavior is normal without agitation , not overly nervous and no depressed affect No other exam findings    Assessment & Plan:

## 2017-03-12 NOTE — Assessment & Plan Note (Signed)
stable overall by history and exam, and pt to continue medical treatment as before,  to f/u any worsening symptoms or concerns 

## 2017-03-12 NOTE — Assessment & Plan Note (Signed)
stable overall by history and exam, recent data reviewed with pt, and pt to continue medical treatment as before,  to f/u any worsening symptoms or concerns BP Readings from Last 3 Encounters:  03/12/17 112/74  03/05/17 130/78  02/12/17 (!) 146/80

## 2017-03-12 NOTE — Assessment & Plan Note (Signed)
>>  ASSESSMENT AND PLAN FOR HTN (HYPERTENSION) WRITTEN ON 03/12/2017 12:34 PM BY Corwin Levins, MD  stable overall by history and exam, recent data reviewed with pt, and pt to continue medical treatment as before,  to f/u any worsening symptoms or concerns BP Readings from Last 3 Encounters:  03/12/17 112/74  03/05/17 130/78  02/12/17 (!) 146/80

## 2017-03-19 DIAGNOSIS — F4329 Adjustment disorder with other symptoms: Secondary | ICD-10-CM | POA: Diagnosis not present

## 2017-03-25 ENCOUNTER — Other Ambulatory Visit: Payer: Self-pay | Admitting: Cardiology

## 2017-04-02 ENCOUNTER — Other Ambulatory Visit: Payer: Self-pay | Admitting: Internal Medicine

## 2017-04-07 ENCOUNTER — Encounter: Payer: Self-pay | Admitting: Pulmonary Disease

## 2017-04-09 ENCOUNTER — Ambulatory Visit (INDEPENDENT_AMBULATORY_CARE_PROVIDER_SITE_OTHER): Payer: Medicare Other | Admitting: Cardiology

## 2017-04-09 ENCOUNTER — Encounter: Payer: Self-pay | Admitting: Cardiology

## 2017-04-09 VITALS — BP 130/70 | HR 81 | Ht 63.0 in | Wt 137.2 lb

## 2017-04-09 DIAGNOSIS — I471 Supraventricular tachycardia: Secondary | ICD-10-CM | POA: Diagnosis not present

## 2017-04-09 DIAGNOSIS — R079 Chest pain, unspecified: Secondary | ICD-10-CM

## 2017-04-09 DIAGNOSIS — I10 Essential (primary) hypertension: Secondary | ICD-10-CM

## 2017-04-09 DIAGNOSIS — I70213 Atherosclerosis of native arteries of extremities with intermittent claudication, bilateral legs: Secondary | ICD-10-CM

## 2017-04-09 NOTE — Patient Instructions (Signed)

## 2017-04-09 NOTE — Progress Notes (Signed)
Cardiology Office Note    Date:  04/09/2017   ID:  Alexandra Reyes, DOB 12/14/1925, MRN [id number]  PCP:  [name], [name], MD  Cardiologist:   Donato SchultzMark Morganne Haile, MD , EP: Dr. [name]    History of Present Illness:  Alexandra Reyes is a 83 y.o. female former patient of Dr. Yevonne PaxBrackbill's here for follow-up of paroxysmal supraventricular tachycardia-Had ablation by Dr. [name] on 09/19/16, successful. She previously was on amiodarone but did not like the side effects, and was particularly bored about shortness of breath. Previously she had been diagnosed with paroxysmal atrial tachycardia as well. She is retired Engineer, civil (consulting)nurse. She has been a widow for over 20 years.Husband Alexandra Reyes died in [id number].   Nuclear stress test in 2010 and 06/02/14 was normal. No prior cardiac catheterization.  Echocardiogram from 06/16/13 showed diastolic dysfunction, EF 65%.  Chest wall pain has been stable. Exertional dyspnea has been followed by pulmonary.She also has GERD-like symptoms after eating. She has had this for years. Her shortness of breath has been fairly stable. Trying Symbicort. Decreased dose because of shakiness with increased dose.  [place], she worked at [employer], school of nursing.Came here pol[facility] hospital. [facility].   04/09/17  - July back into tachycardia. 4 months post ablation. Took cardizem.  Tried monitor at that point. Would stop with rest. Now back on meds, cardizem. Aug 22. Discussed maybe going back in for ablation. Never wants amiodarone again. Feeling better.  Has right-sided chest wall pain.  It has been suggested that she may see pain management for potential nerve block.   Past Medical History:  Diagnosis Date  . Age-related macular degeneration, wet, both eyes (HCC)   . Alcohol dependence in remission (HCC) 05/22/2011  . Allergic rhinitis, cause unspecified 05/22/2011  . Allergy   . Anemia, iron deficiency 05/18/2011  . Cholelithiasis   . COPD (chronic obstructive  pulmonary disease) (HCC)   . DDD (degenerative disc disease), lumbar 05/18/2011  . Depression    "@ times" (09/19/2016)  . First degree AV block   . GERD (gastroesophageal reflux disease)   . Hiatal hernia   . History of blood transfusion    "3 or 4 related to childbirth"  . HTN (hypertension) 05/18/2011   pt denies this hx on 09/19/2016  . Hyperlipidemia 05/18/2011  . Macular degeneration   . Myocardial infarction (HCC)    "EKG shows I've had 2; date unknown" (09/19/2016)  . OA (osteoarthritis)    "a little here and there; mainly in the back" (09/19/2016)  . Osteoporosis 05/18/2011  . PAT (paroxysmal atrial tachycardia) (HCC)   . Pectus excavatum 05/18/2011  . Personal history of colonic polyps 10/28/2012  . Pneumonia    "as a child"  . SVT (supraventricular tachycardia) (HCC)   . Urinary frequency     Past Surgical History:  Procedure Laterality Date  . ABDOMINAL HYSTERECTOMY  1988  . BREAST BIOPSY Right 1948   "it was ok"  . CARDIAC CATHETERIZATION    . CARDIOVASCULAR STRESS TEST  03/08/2009   EF 84%  . CATARACT EXTRACTION W/ INTRAOCULAR LENS  IMPLANT, BILATERAL Bilateral   . KNEE ARTHROSCOPY Right   . RIGHT/LEFT HEART CATH AND CORONARY ANGIOGRAPHY N/A 07/25/2016   Procedure: Right/Left Heart Cath and Coronary Angiography;  Surgeon: Kathleene Hazelhristopher D McAlhany, MD;  [facility];  Service: Cardiovascular;  Laterality: N/A;  . SVT ABLATION  09/19/2016  . SVT ABLATION N/A 09/19/2016   Procedure: SVT  Ablation;  Surgeon: Hillis Range, MD;  Location: [facility] INVASIVE CV LAB;  Service: Cardiovascular;  Laterality: N/A;  . TONSILLECTOMY AND ADENOIDECTOMY    . US ECHOCARDIOGRAPHY  01/17/2003   EF 55-60%    Current Medications: Outpatient Medications Prior to Visit  Medication Sig Dispense Refill  . acetaminophen (TYLENOL) 500 MG tablet Take 1,000 mg by mouth 2 (two) times daily as needed for moderate pain or headache.    . ALPRAZolam (XANAX) 0.25 MG tablet TAKE ONE TABLET BY  MOUTH AT BEDTIME AS NEEDED FOR ANXIETY 90 tablet 1  . amLODipine (NORVASC) 5 MG tablet Take 1 tablet (5 mg total) by mouth daily. 90 tablet 3  . Ascorbic Acid (VITAMIN C PO) Take 1 tablet by mouth daily. Reported on 08/08/2015    . aspirin 81 MG tablet Take 81 mg by mouth at bedtime.     . B Complex Vitamins (VITAMIN B COMPLEX PO) Take 1 tablet by mouth daily.     . calcium carbonate (OS-CAL) 600 MG tablet Take 600 mg by mouth daily.    . calcium carbonate (TUMS - DOSED IN MG ELEMENTAL CALCIUM) 500 MG chewable tablet Chew 3 tablets by mouth daily as needed for indigestion or heartburn.    . diltiazem (CARDIZEM CD) 120 MG 24 hr capsule Take 1 capsule (120 mg total) by mouth daily. 90 capsule 3  . diltiazem (CARDIZEM) 30 MG tablet Take 1 tablet (30 mg total) by mouth 4 (four) times daily as needed. 120 tablet 11  . docusate sodium (COLACE) 100 MG capsule Take 100 mg by mouth as needed for mild constipation (Take as directed).     . ferrous sulfate 325 (65 FE) MG tablet Take 325 mg by mouth daily.     . furosemide (LASIX) 40 MG tablet TAKE ONE TABLET BY MOUTH DAILY 90 tablet 3  . Glucosamine HCl-MSM (GLUCOSAMINE-MSM PO) Take 2 tablets by mouth daily.     . nitroGLYCERIN (NITROSTAT) 0.4 MG SL tablet Place 1 tablet (0.4 mg total) under the tongue every 5 (five) minutes as needed for chest pain. 25 tablet 3  . omeprazole (PRILOSEC) 40 MG capsule TAKE ONE CAPSULE BY MOUTH DAILY 30 capsule 3  . PRESCRIPTION MEDICATION Apply 1 Dose to eye every 3 (three) months. Eylea - eye injection    . simvastatin (ZOCOR) 10 MG tablet Take 1 tablet (10 mg total) by mouth daily at 6 PM. 90 tablet 3  . SYMBICORT 80-4.5 MCG/ACT inhaler INHALE 1 PUFF INTO THE LUNGS TWICE A DAY AS DIRECTED 1 Inhaler 5  . tobramycin (TOBREX) 0.3 % ophthalmic solution Place 1 drop into the right eye 4 (four) times daily. Use the day before, the day of, and the day after eye injections    . traMADol (ULTRAM) 50 MG tablet Take 1 tablet (50 mg  total) by mouth every 6 (six) hours as needed for moderate pain. 90 tablet 0  . triamcinolone cream (KENALOG) 0.1 % Apply 1 application topically daily as needed (hives). Apply to affected area     . vitamin E 200 UNIT capsule Take 200 Units by mouth daily.     No facility-administered medications prior to visit.      Allergies:   Symbicort [budesonide-formoterol fumarate]   Social History   Social History  . Marital status: Widowed    Spouse name: N/A  . Number of children: Y  . Years of education: 40   Occupational History  . Retired Designer, jewellery    Social  History Main Topics  . Smoking status: Former Smoker    Packs/day: 1.50    Years: 36.00    Types: Cigarettes    Start date: 03/01/1959    Quit date: 06/10/1994  . Smokeless tobacco: Never Used     Comment: smoked 1- 1.5 ppd.   . Alcohol use 0.0 oz/week     Comment: 09/19/2016 "quit drinking in ~ 1978"  . Drug use: No  . Sexual activity: Not Asked   Other Topics Concern  . None   Social History Narrative   Originally from [place], [place]. She moved to [place] in 1950 during the polio epidemic. 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 Remote bird exposure. No mold or hot tub exposure.      Family History:  The patient's family history includes Arthritis in her mother; Heart disease in her father; Heart failure in her mother; Hypertension in her father; Kidney failure in her father.   ROS:   Please see the history of present illness.    Review of Systems  All other systems reviewed and are negative.     PHYSICAL EXAM:   VS:  BP 130/70   Pulse 81   Ht 5\' 3"  (1.6 m)   Wt 137 lb 3.2 oz (62.2 kg)   LMP  (LMP Unknown)   SpO2 96%   BMI 24.30 kg/m    GEN: Well nourished, well developed, in no acute distress  HEENT: normal  Neck: no JVD, carotid bruits, or masses Cardiac: RRR; no murmurs, rubs, or gallops,no edema  Respiratory:  clear to auscultation bilaterally, normal work of breathing GI: soft,  nontender, nondistended, + BS MS: no deformity or atrophy  Skin: warm and dry, no rash Neuro:  Alert and Oriented x 3, Strength and sensation are intact Psych: euthymic mood, full affect   Wt Readings from Last 3 Encounters:  04/09/17 137 lb 3.2 oz (62.2 kg)  03/12/17 134 lb (60.8 kg)  03/05/17 137 lb (62.1 kg)      Studies/Labs Reviewed:   EKG: none today Recent Labs: 09/19/2016: Hemoglobin 15.0; Platelets 351 09/30/2016: ALT 16 03/12/2017: BUN 14; Creatinine, Ser 0.83; Potassium 4.3; Sodium 135   Lipid Panel    Component Value Date/Time   CHOL 160 09/30/2016 1205   TRIG 90 09/30/2016 1205   HDL 73 09/30/2016 1205   CHOLHDL 2.2 09/30/2016 1205   CHOLHDL 2.0 05/24/2015 0831   VLDL 14 05/24/2015 0831   LDLCALC 69 09/30/2016 1205   LDLDIRECT 129.2 05/15/2011 0925    Additional studies/ records that were reviewed today include:  Prior office visits, echocardiogram, lab work reviewed  NUC stress: 07/12/15:  Nuclear stress EF: 80%.  Defect 1: There is a small defect of moderate severity present in the basal inferoseptal location.  Findings consistent with prior myocardial infarction.  This is a low risk study.  Cath 07/25/16  Prox RCA lesion, 100 %stenosed.  Prox LAD lesion, 20 %stenosed.  LV end diastolic pressure is normal.   1. 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 The mid and distal vessel fills briskly from left to right collaterals.  2. Mild non-obstructive disease in the LAD 3. Normal filling pressures.  4. Normal PA pressures  Recommendations: Medical management of CAD. I would not recommend the right radial approach for future cardiac caths.   ASSESSMENT:    1. Chest pain, unspecified type   2. Essential hypertension   3. Paroxysmal SVT (supraventricular tachycardia) (HCC)      PLAN:  In order of problems listed above:  Paroxysmal supraventricular tachycardia  - Had SVT ablation on 09/19/16 by Dr. Johney Frame. Inducible AVNRT.  She then had  return of tachycardia a proximally 4 months after ablation described as long RP tachycardia.  This now seems to be controlled with diltiazem.  We went through several questions today. She is doing well. No further tachycardia arrhythmia.  - She was worried about shortness of breath with amiodarone. She was worried about potential side effects of amiodarone.In fact, her eye doctor noticed deposition.  - No indication for pacemaker.  -Currently well controlled with diltiazem.  Appreciate Dr. Jenel Lucks assistance.  Diastolic dysfunction  - Stable, normal ejection fraction  - Has not had any evidence of heart failure, excellent.  Coronary artery disease  -Continue with medical management, secondary prevention.  -Cardiac catheterization 07/25/16- chronically occluded RCA.  Catheterization was done after progressive nature of her symptoms, shortness of breath, chest pain.  Seems to be doing quite well.  I think that her right-sided chest pain is neuropathic as well.  Chronic venous insufficiency/edema  - Support hose, agree  - Leg elevation  Essential hypertension  -Medications reviewed, stable. No changes.  Hyperlipidemia  - simvastatin low dose.    Medication Adjustments/Labs and Tests Ordered: Current medicines are reviewed at length with the patient today.  Concerns regarding medicines are outlined above.  Medication changes, Labs and Tests ordered today are listed in the Patient Instructions below. Patient Instructions  Medication Instructions:  The current medical regimen is effective;  continue present plan and medications.  Follow-Up: Follow up in 1 year with Dr. Anne Fu.  You will receive a letter in the mail 2 months before you are due.  Please call us when you receive this letter to schedule your follow up appointment.  If you need a refill on your cardiac medications before your next appointment, please call your pharmacy.  Thank you for choosing [facility]!!        Signed, Donato Schultz, MD  04/09/2017 12:34 PM    [facility] [address] [place], [place], [place]  [address] Phone: [phone]; Fax: [phone]

## 2017-04-16 DIAGNOSIS — F4329 Adjustment disorder with other symptoms: Secondary | ICD-10-CM | POA: Diagnosis not present

## 2017-04-16 DIAGNOSIS — Z23 Encounter for immunization: Secondary | ICD-10-CM | POA: Diagnosis not present

## 2017-04-22 ENCOUNTER — Other Ambulatory Visit: Payer: Self-pay | Admitting: Cardiology

## 2017-04-23 ENCOUNTER — Telehealth: Payer: Self-pay | Admitting: *Deleted

## 2017-04-23 NOTE — Telephone Encounter (Signed)
Message created in error

## 2017-04-30 DIAGNOSIS — N765 Ulceration of vagina: Secondary | ICD-10-CM | POA: Diagnosis not present

## 2017-04-30 DIAGNOSIS — N816 Rectocele: Secondary | ICD-10-CM | POA: Diagnosis not present

## 2017-05-06 ENCOUNTER — Telehealth: Payer: Self-pay | Admitting: Pulmonary Disease

## 2017-05-06 DIAGNOSIS — R0789 Other chest pain: Secondary | ICD-10-CM

## 2017-05-06 NOTE — Telephone Encounter (Signed)
Pt requesting refill on Tramadol 50mg . JN please advise.  Last refill 03/05/2017 Last OV 03/05/2017   [facility] [address]  Current Outpatient Medications on File Prior to Visit  Medication Sig Dispense Refill  . acetaminophen (TYLENOL) 500 MG tablet Take 1,000 mg by mouth 2 (two) times daily as needed for moderate pain or headache.    . ALPRAZolam (XANAX) 0.25 MG tablet TAKE ONE TABLET BY MOUTH AT BEDTIME AS NEEDED FOR ANXIETY 90 tablet 1  . amLODipine (NORVASC) 5 MG tablet Take 1 tablet (5 mg total) by mouth daily. 90 tablet 3  . Ascorbic Acid (VITAMIN C PO) Take 1 tablet by mouth daily. Reported on 08/08/2015    . aspirin 81 MG tablet Take 81 mg by mouth at bedtime.     . B Complex Vitamins (VITAMIN B COMPLEX PO) Take 1 tablet by mouth daily.     . calcium carbonate (OS-CAL) 600 MG tablet Take 600 mg by mouth daily.    . calcium carbonate (TUMS - DOSED IN MG ELEMENTAL CALCIUM) 500 MG chewable tablet Chew 3 tablets by mouth daily as needed for indigestion or heartburn.    . diltiazem (CARDIZEM CD) 120 MG 24 hr capsule Take 1 capsule (120 mg total) by mouth daily. 90 capsule 3  . diltiazem (CARDIZEM) 30 MG tablet Take 1 tablet (30 mg total) by mouth 4 (four) times daily as needed. 120 tablet 11  . docusate sodium (COLACE) 100 MG capsule Take 100 mg by mouth as needed for mild constipation (Take as directed).     . ferrous sulfate 325 (65 FE) MG tablet Take 325 mg by mouth daily.     . furosemide (LASIX) 40 MG tablet TAKE ONE TABLET BY MOUTH DAILY 90 tablet 3  . Glucosamine HCl-MSM (GLUCOSAMINE-MSM PO) Take 2 tablets by mouth daily.     . nitroGLYCERIN (NITROSTAT) 0.4 MG SL tablet Place 1 tablet (0.4 mg total) under the tongue every 5 (five) minutes as needed for chest pain. 25 tablet 3  . omeprazole (PRILOSEC) 40 MG capsule TAKE ONE CAPSULE BY MOUTH DAILY 30 capsule 3  . PRESCRIPTION MEDICATION Apply 1 Dose to eye every 3 (three) months. Eylea - eye injection    . simvastatin (ZOCOR) 10 MG  tablet TAKE ONE TABLET BY MOUTH EVERY EVENING AT 6:00 PM 90 tablet 2  . SYMBICORT 80-4.5 MCG/ACT inhaler INHALE 1 PUFF INTO THE LUNGS TWICE A DAY AS DIRECTED 1 Inhaler 5  . tobramycin (TOBREX) 0.3 % ophthalmic solution Place 1 drop into the right eye 4 (four) times daily. Use the day before, the day of, and the day after eye injections    . traMADol (ULTRAM) 50 MG tablet Take 1 tablet (50 mg total) by mouth every 6 (six) hours as needed for moderate pain. 90 tablet 0  . triamcinolone cream (KENALOG) 0.1 % Apply 1 application topically daily as needed (hives). Apply to affected area     . vitamin E 200 UNIT capsule Take 200 Units by mouth daily.     No current facility-administered medications on file prior to visit.    Allergies  Allergen Reactions  . Symbicort [Budesonide-Formoterol Fumarate]     Jittery in high doses, this has been reduced and is tolerated at this time.

## 2017-05-07 MED ORDER — TRAMADOL HCL 50 MG PO TABS: 50.0000 mg | ORAL_TABLET | Freq: Four times a day (QID) | ORAL | 0 refills | Status: DC | PRN

## 2017-05-07 NOTE — Telephone Encounter (Signed)
ATC pt, no answer. Left message for pt to call back.  [facility] [address].

## 2017-05-07 NOTE — Telephone Encounter (Signed)
That's fine with me. Just write it as we did before without any refills. Thanks.

## 2017-05-07 NOTE — Telephone Encounter (Signed)
Pt is aware that Rx has been sent to preferred pharmacy.  Nothing further needed.

## 2017-05-09 DIAGNOSIS — H353222 Exudative age-related macular degeneration, left eye, with inactive choroidal neovascularization: Secondary | ICD-10-CM | POA: Diagnosis not present

## 2017-05-09 DIAGNOSIS — H35372 Puckering of macula, left eye: Secondary | ICD-10-CM | POA: Diagnosis not present

## 2017-05-09 DIAGNOSIS — H353223 Exudative age-related macular degeneration, left eye, with inactive scar: Secondary | ICD-10-CM | POA: Diagnosis not present

## 2017-05-09 DIAGNOSIS — H43813 Vitreous degeneration, bilateral: Secondary | ICD-10-CM | POA: Diagnosis not present

## 2017-05-14 ENCOUNTER — Encounter: Payer: Self-pay | Admitting: Internal Medicine

## 2017-05-14 ENCOUNTER — Ambulatory Visit (INDEPENDENT_AMBULATORY_CARE_PROVIDER_SITE_OTHER): Payer: Medicare Other | Admitting: Internal Medicine

## 2017-05-14 VITALS — BP 132/74 | HR 73 | Ht 63.0 in | Wt 136.0 lb

## 2017-05-14 DIAGNOSIS — I10 Essential (primary) hypertension: Secondary | ICD-10-CM

## 2017-05-14 DIAGNOSIS — I70213 Atherosclerosis of native arteries of extremities with intermittent claudication, bilateral legs: Secondary | ICD-10-CM

## 2017-05-14 DIAGNOSIS — I471 Supraventricular tachycardia: Secondary | ICD-10-CM

## 2017-05-14 NOTE — Patient Instructions (Signed)

## 2017-05-14 NOTE — Progress Notes (Signed)
PCP: [name], Alexandra Parzych W, MD Primary Cardiologist: Dr Anne FuSkains Primary EP: Dr [name]  Alexandra Reyes is a 83 y.o. female who presents today for routine electrophysiology followup.  Since last being seen in our clinic, the patient reports doing very well.  No further SVT.  Her atypical chest pain is stable.  Today, she denies symptoms of palpitations, exertional chest pain, shortness of breath,  lower extremity edema, dizziness, presyncope, or syncope.  The patient is otherwise without complaint today.   Past Medical History:  Diagnosis Date  . Age-related macular degeneration, wet, both eyes (HCC)   . Alcohol dependence in remission (HCC) 05/22/2011  . Allergic rhinitis, cause unspecified 05/22/2011  . Allergy   . Anemia, iron deficiency 05/18/2011  . Cholelithiasis   . COPD (chronic obstructive pulmonary disease) (HCC)   . DDD (degenerative disc disease), lumbar 05/18/2011  . Depression    "@ times" (09/19/2016)  . First degree AV block   . GERD (gastroesophageal reflux disease)   . Hiatal hernia   . History of blood transfusion    "3 or 4 related to childbirth"  . HTN (hypertension) 05/18/2011   pt denies this hx on 09/19/2016  . Hyperlipidemia 05/18/2011  . Macular degeneration   . Myocardial infarction (HCC)    "EKG shows I've had 2; date unknown" (09/19/2016)  . OA (osteoarthritis)    "a little here and there; mainly in the back" (09/19/2016)  . Osteoporosis 05/18/2011  . PAT (paroxysmal atrial tachycardia) (HCC)   . Pectus excavatum 05/18/2011  . Personal history of colonic polyps 10/28/2012  . Pneumonia    "as a child"  . SVT (supraventricular tachycardia) (HCC)   . Urinary frequency    Past Surgical History:  Procedure Laterality Date  . ABDOMINAL HYSTERECTOMY  1988  . BREAST BIOPSY Right 1948   "it was ok"  . CARDIAC CATHETERIZATION    . CARDIOVASCULAR STRESS TEST  03/08/2009   EF 84%  . CATARACT EXTRACTION Reyes/ INTRAOCULAR LENS  IMPLANT, BILATERAL Bilateral   . KNEE  ARTHROSCOPY Right   . RIGHT/LEFT HEART CATH AND CORONARY ANGIOGRAPHY N/A 07/25/2016   Procedure: Right/Left Heart Cath and Coronary Angiography;  Surgeon: Kathleene Hazelhristopher D McAlhany, MD;  [facility];  Service: Cardiovascular;  Laterality: N/A;  . SVT ABLATION  09/19/2016  . SVT ABLATION N/A 09/19/2016   Procedure: SVT Ablation;  Surgeon: Alexandra RangeJames Ashlie Mcmenamy, MD;  [facility];  Service: Cardiovascular;  Laterality: N/A;  . TONSILLECTOMY AND ADENOIDECTOMY    . US ECHOCARDIOGRAPHY  01/17/2003   EF 55-60%    ROS- all systems are reviewed and negatives except as per HPI above  Current Outpatient Medications  Medication Sig Dispense Refill  . acetaminophen (TYLENOL) 500 MG tablet Take 1,000 mg by mouth 2 (two) times daily as needed for moderate pain or headache.    . ALPRAZolam (XANAX) 0.25 MG tablet TAKE ONE TABLET BY MOUTH AT BEDTIME AS NEEDED FOR ANXIETY 90 tablet 1  . amLODipine (NORVASC) 5 MG tablet Take 1 tablet (5 mg total) by mouth daily. 90 tablet 3  . Ascorbic Acid (VITAMIN C PO) Take 1 tablet by mouth daily. Reported on 08/08/2015    . aspirin 81 MG tablet Take 81 mg by mouth at bedtime.     . B Complex Vitamins (VITAMIN B COMPLEX PO) Take 1 tablet by mouth daily.     . calcium carbonate (OS-CAL) 600 MG tablet Take 600 mg by mouth daily.    .Marland Kitchen  calcium carbonate (TUMS - DOSED IN MG ELEMENTAL CALCIUM) 500 MG chewable tablet Chew 3 tablets by mouth daily as needed for indigestion or heartburn.    . diltiazem (CARDIZEM) 30 MG tablet Take 1 tablet (30 mg total) by mouth 4 (four) times daily as needed. 120 tablet 11  . docusate sodium (COLACE) 100 MG capsule Take 100 mg by mouth as needed for mild constipation (Take as directed).     . ferrous sulfate 325 (65 FE) MG tablet Take 325 mg by mouth daily.     . furosemide (LASIX) 40 MG tablet TAKE ONE TABLET BY MOUTH DAILY 90 tablet 3  . Glucosamine HCl-MSM (GLUCOSAMINE-MSM PO) Take 2 tablets by mouth daily.     .  nitroGLYCERIN (NITROSTAT) 0.4 MG SL tablet Place 1 tablet (0.4 mg total) under the tongue every 5 (five) minutes as needed for chest pain. 25 tablet 3  . omeprazole (PRILOSEC) 40 MG capsule TAKE ONE CAPSULE BY MOUTH DAILY 30 capsule 3  . PRESCRIPTION MEDICATION Apply 1 Dose to eye every 3 (three) months. Eylea - eye injection    . simvastatin (ZOCOR) 10 MG tablet TAKE ONE TABLET BY MOUTH EVERY EVENING AT 6:00 PM 90 tablet 2  . SYMBICORT 80-4.5 MCG/ACT inhaler INHALE 1 PUFF INTO THE LUNGS TWICE A DAY AS DIRECTED 1 Inhaler 5  . tobramycin (TOBREX) 0.3 % ophthalmic solution Place 1 drop into the right eye 4 (four) times daily. Use the day before, the day of, and the day after eye injections    . traMADol (ULTRAM) 50 MG tablet Take 1 tablet (50 mg total) by mouth every 6 (six) hours as needed for moderate pain. 90 tablet 0  . triamcinolone cream (KENALOG) 0.1 % Apply 1 application topically daily as needed (hives). Apply to affected area     . vitamin E 200 UNIT capsule Take 200 Units by mouth daily.    Marland Kitchen. diltiazem (CARDIZEM CD) 120 MG 24 hr capsule Take 1 capsule (120 mg total) by mouth daily. 90 capsule 3   No current facility-administered medications for this visit.     Physical Exam: Vitals:   05/14/17 1152  BP: 132/74  Pulse: 73  SpO2: 99%  Weight: 136 lb (61.7 kg)  Height: 5\' 3"  (1.6 m)    GEN- The patient is well appearing, alert and oriented x 3 today.   Head- normocephalic, atraumatic Eyes-  Sclera clear, conjunctiva pink Ears- hearing intact Oropharynx- clear Lungs- Clear to ausculation bilaterally, normal work of breathing Heart- Regular rate and rhythm, no murmurs, rubs or gallops, PMI not laterally displaced GI- soft, NT, ND, + BS Extremities- no clubbing, cyanosis, or edema  EKG tracing ordered today is personally reviewed and shows sinus rhythm, PR 217 msec, septal infarct, LAD  Assessment and Plan:  1. SVT Stable No change required today  2. HTN Stable No  change required today  Return in 6 months  Alexandra RangeJames Annalaya Wile MD, [facility] 05/14/2017 12:09 PM

## 2017-05-28 DIAGNOSIS — N765 Ulceration of vagina: Secondary | ICD-10-CM | POA: Diagnosis not present

## 2017-05-28 DIAGNOSIS — N816 Rectocele: Secondary | ICD-10-CM | POA: Diagnosis not present

## 2017-06-18 DIAGNOSIS — F4329 Adjustment disorder with other symptoms: Secondary | ICD-10-CM | POA: Diagnosis not present

## 2017-06-23 ENCOUNTER — Encounter (HOSPITAL_COMMUNITY): Payer: Self-pay | Admitting: Emergency Medicine

## 2017-06-23 ENCOUNTER — Inpatient Hospital Stay (HOSPITAL_COMMUNITY)
Admission: EM | Admit: 2017-06-23 | Discharge: 2017-06-25 | DRG: 352 | Disposition: A | Discharge status: Home / Self Care | Payer: Medicare Other | Attending: Surgery | Admitting: Surgery

## 2017-06-23 ENCOUNTER — Other Ambulatory Visit: Payer: Self-pay

## 2017-06-23 ENCOUNTER — Encounter (HOSPITAL_COMMUNITY): Admission: EM | Disposition: A | Discharge status: Home / Self Care | Payer: Self-pay | Source: Home / Self Care

## 2017-06-23 ENCOUNTER — Ambulatory Visit (INDEPENDENT_AMBULATORY_CARE_PROVIDER_SITE_OTHER)
Admission: EM | Admit: 2017-06-23 | Discharge: 2017-06-23 | Disposition: A | Discharge status: Home / Self Care | Payer: Medicare Other | Source: Home / Self Care | Attending: Urgent Care | Admitting: Urgent Care

## 2017-06-23 ENCOUNTER — Emergency Department (HOSPITAL_COMMUNITY): Payer: Medicare Other

## 2017-06-23 ENCOUNTER — Inpatient Hospital Stay (HOSPITAL_COMMUNITY): Payer: Medicare Other | Admitting: Anesthesiology

## 2017-06-23 ENCOUNTER — Encounter (HOSPITAL_COMMUNITY): Payer: Self-pay

## 2017-06-23 DIAGNOSIS — I2510 Atherosclerotic heart disease of native coronary artery without angina pectoris: Secondary | ICD-10-CM | POA: Diagnosis present

## 2017-06-23 DIAGNOSIS — J449 Chronic obstructive pulmonary disease, unspecified: Secondary | ICD-10-CM | POA: Diagnosis present

## 2017-06-23 DIAGNOSIS — Z79899 Other long term (current) drug therapy: Secondary | ICD-10-CM

## 2017-06-23 DIAGNOSIS — Z86010 Personal history of colonic polyps: Secondary | ICD-10-CM | POA: Diagnosis not present

## 2017-06-23 DIAGNOSIS — F1021 Alcohol dependence, in remission: Secondary | ICD-10-CM | POA: Diagnosis present

## 2017-06-23 DIAGNOSIS — Z9842 Cataract extraction status, left eye: Secondary | ICD-10-CM

## 2017-06-23 DIAGNOSIS — K219 Gastro-esophageal reflux disease without esophagitis: Secondary | ICD-10-CM | POA: Diagnosis present

## 2017-06-23 DIAGNOSIS — Z888 Allergy status to other drugs, medicaments and biological substances status: Secondary | ICD-10-CM

## 2017-06-23 DIAGNOSIS — R109 Unspecified abdominal pain: Secondary | ICD-10-CM

## 2017-06-23 DIAGNOSIS — I129 Hypertensive chronic kidney disease with stage 1 through stage 4 chronic kidney disease, or unspecified chronic kidney disease: Secondary | ICD-10-CM | POA: Diagnosis present

## 2017-06-23 DIAGNOSIS — N183 Chronic kidney disease, stage 3 (moderate): Secondary | ICD-10-CM | POA: Diagnosis present

## 2017-06-23 DIAGNOSIS — I252 Old myocardial infarction: Secondary | ICD-10-CM | POA: Diagnosis not present

## 2017-06-23 DIAGNOSIS — K4030 Unilateral inguinal hernia, with obstruction, without gangrene, not specified as recurrent: Secondary | ICD-10-CM | POA: Diagnosis not present

## 2017-06-23 DIAGNOSIS — Z961 Presence of intraocular lens: Secondary | ICD-10-CM | POA: Diagnosis present

## 2017-06-23 DIAGNOSIS — I471 Supraventricular tachycardia: Secondary | ICD-10-CM | POA: Diagnosis not present

## 2017-06-23 DIAGNOSIS — K56609 Unspecified intestinal obstruction, unspecified as to partial versus complete obstruction: Secondary | ICD-10-CM | POA: Diagnosis not present

## 2017-06-23 DIAGNOSIS — Z9841 Cataract extraction status, right eye: Secondary | ICD-10-CM | POA: Diagnosis not present

## 2017-06-23 DIAGNOSIS — Z841 Family history of disorders of kidney and ureter: Secondary | ICD-10-CM

## 2017-06-23 DIAGNOSIS — E785 Hyperlipidemia, unspecified: Secondary | ICD-10-CM | POA: Diagnosis not present

## 2017-06-23 DIAGNOSIS — Z8249 Family history of ischemic heart disease and other diseases of the circulatory system: Secondary | ICD-10-CM

## 2017-06-23 DIAGNOSIS — R300 Dysuria: Secondary | ICD-10-CM | POA: Diagnosis present

## 2017-06-23 DIAGNOSIS — Z87891 Personal history of nicotine dependence: Secondary | ICD-10-CM

## 2017-06-23 DIAGNOSIS — K4130 Unilateral femoral hernia, with obstruction, without gangrene, not specified as recurrent: Principal | ICD-10-CM | POA: Diagnosis present

## 2017-06-23 DIAGNOSIS — R1903 Right lower quadrant abdominal swelling, mass and lump: Secondary | ICD-10-CM

## 2017-06-23 DIAGNOSIS — Z90710 Acquired absence of both cervix and uterus: Secondary | ICD-10-CM | POA: Diagnosis not present

## 2017-06-23 DIAGNOSIS — Z9049 Acquired absence of other specified parts of digestive tract: Secondary | ICD-10-CM

## 2017-06-23 DIAGNOSIS — I10 Essential (primary) hypertension: Secondary | ICD-10-CM | POA: Diagnosis not present

## 2017-06-23 HISTORY — PX: INGUINAL HERNIA REPAIR: SHX194

## 2017-06-23 LAB — URINALYSIS, ROUTINE W REFLEX MICROSCOPIC
BACTERIA UA: NONE SEEN
Bilirubin Urine: NEGATIVE
GLUCOSE, UA: NEGATIVE mg/dL
Ketones, ur: 5 mg/dL — AB
Leukocytes, UA: NEGATIVE
Nitrite: NEGATIVE
PROTEIN: NEGATIVE mg/dL
Specific Gravity, Urine: 1.005 (ref 1.005–1.030)
WBC, UA: NONE SEEN WBC/hpf (ref 0–5)
pH: 7 (ref 5.0–8.0)

## 2017-06-23 LAB — CBC WITH DIFFERENTIAL/PLATELET
Basophils Absolute: 0 10*3/uL (ref 0.0–0.1)
Basophils Relative: 0 %
Eosinophils Absolute: 0 10*3/uL (ref 0.0–0.7)
Eosinophils Relative: 0 %
HCT: 48.9 % — ABNORMAL HIGH (ref 36.0–46.0)
Hemoglobin: 17.3 g/dL — ABNORMAL HIGH (ref 12.0–15.0)
Lymphocytes Relative: 7 %
Lymphs Abs: 0.8 10*3/uL (ref 0.7–4.0)
MCH: 30.3 pg (ref 26.0–34.0)
MCHC: 35.4 g/dL (ref 30.0–36.0)
MCV: 85.6 fL (ref 78.0–100.0)
Monocytes Absolute: 0.7 10*3/uL (ref 0.1–1.0)
Monocytes Relative: 6 %
Neutro Abs: 10.5 10*3/uL — ABNORMAL HIGH (ref 1.7–7.7)
Neutrophils Relative %: 87 %
Platelets: 351 10*3/uL (ref 150–400)
RBC: 5.71 MIL/uL — ABNORMAL HIGH (ref 3.87–5.11)
RDW: 13.7 % (ref 11.5–15.5)
WBC: 12 10*3/uL — ABNORMAL HIGH (ref 4.0–10.5)

## 2017-06-23 LAB — COMPREHENSIVE METABOLIC PANEL
ALT: 20 U/L (ref 14–54)
AST: 30 U/L (ref 15–41)
Albumin: 4.3 g/dL (ref 3.5–5.0)
Alkaline Phosphatase: 90 U/L (ref 38–126)
Anion gap: 15 (ref 5–15)
BUN: 13 mg/dL (ref 6–20)
CHLORIDE: 96 mmol/L — AB (ref 101–111)
CO2: 20 mmol/L — ABNORMAL LOW (ref 22–32)
CREATININE: 0.99 mg/dL (ref 0.44–1.00)
Calcium: 9.6 mg/dL (ref 8.9–10.3)
GFR calc Af Amer: 57 mL/min — ABNORMAL LOW (ref >60)
GFR, EST NON AFRICAN AMERICAN: 49 mL/min — AB (ref >60)
Glucose, Bld: 107 mg/dL — ABNORMAL HIGH (ref 65–99)
Potassium: 3.6 mmol/L (ref 3.5–5.1)
Sodium: 131 mmol/L — ABNORMAL LOW (ref 135–145)
Total Bilirubin: 1 mg/dL (ref 0.3–1.2)
Total Protein: 7.3 g/dL (ref 6.5–8.1)

## 2017-06-23 LAB — LIPASE, BLOOD: LIPASE: 38 U/L (ref 11–51)

## 2017-06-23 SURGERY — REPAIR, HERNIA, INGUINAL, ADULT
Anesthesia: General | Site: Groin | Laterality: Right

## 2017-06-23 MED ORDER — IOPAMIDOL (ISOVUE-300) INJECTION 61%
INTRAVENOUS | Status: AC
  Administered 2017-06-23: 100 mL
  Filled 2017-06-23: qty 100

## 2017-06-23 MED ORDER — PROPOFOL 10 MG/ML IV BOLUS
INTRAVENOUS | Status: DC | PRN
  Administered 2017-06-23: 150 mg via INTRAVENOUS

## 2017-06-23 MED ORDER — BUPIVACAINE-EPINEPHRINE (PF) 0.5% -1:200000 IJ SOLN
INTRAMUSCULAR | Status: AC
  Filled 2017-06-23: qty 30

## 2017-06-23 MED ORDER — ONDANSETRON HCL 4 MG/2ML IJ SOLN
INTRAMUSCULAR | Status: AC
  Filled 2017-06-23: qty 2

## 2017-06-23 MED ORDER — SIMVASTATIN 10 MG PO TABS
10.0000 mg | ORAL_TABLET | Freq: Every day | ORAL | Status: DC
  Administered 2017-06-24: 10 mg via ORAL
  Filled 2017-06-23: qty 1

## 2017-06-23 MED ORDER — FENTANYL CITRATE (PF) 250 MCG/5ML IJ SOLN
INTRAMUSCULAR | Status: DC | PRN
  Administered 2017-06-23 (×2): 50 ug via INTRAVENOUS

## 2017-06-23 MED ORDER — MORPHINE SULFATE (PF) 4 MG/ML IV SOLN
2.0000 mg | Freq: Once | INTRAVENOUS | Status: AC
  Administered 2017-06-23: 2 mg via INTRAVENOUS
  Filled 2017-06-23: qty 1

## 2017-06-23 MED ORDER — DILTIAZEM HCL 30 MG PO TABS: 30.0000 mg | ORAL_TABLET | Freq: Three times a day (TID) | ORAL | Status: DC

## 2017-06-23 MED ORDER — BUPIVACAINE HCL (PF) 0.25 % IJ SOLN
INTRAMUSCULAR | Status: DC | PRN
  Administered 2017-06-23: 20 mL

## 2017-06-23 MED ORDER — DEXAMETHASONE SODIUM PHOSPHATE 10 MG/ML IJ SOLN
INTRAMUSCULAR | Status: AC
  Filled 2017-06-23: qty 1

## 2017-06-23 MED ORDER — NITROGLYCERIN 0.4 MG SL SUBL: 0.4000 mg | SUBLINGUAL_TABLET | SUBLINGUAL | Status: DC | PRN

## 2017-06-23 MED ORDER — LIDOCAINE 2% (20 MG/ML) 5 ML SYRINGE
INTRAMUSCULAR | Status: AC
  Filled 2017-06-23: qty 5

## 2017-06-23 MED ORDER — SUCCINYLCHOLINE 20MG/ML (10ML) SYRINGE FOR MEDFUSION PUMP - OPTIME
INTRAMUSCULAR | Status: DC | PRN
  Administered 2017-06-23: 100 mg via INTRAVENOUS

## 2017-06-23 MED ORDER — AMLODIPINE BESYLATE 5 MG PO TABS
5.0000 mg | ORAL_TABLET | Freq: Every day | ORAL | Status: DC
  Administered 2017-06-24 – 2017-06-25 (×2): 5 mg via ORAL
  Filled 2017-06-23 (×2): qty 1

## 2017-06-23 MED ORDER — FENTANYL CITRATE (PF) 100 MCG/2ML IJ SOLN
25.0000 ug | INTRAMUSCULAR | Status: DC | PRN
  Administered 2017-06-24: 25 ug via INTRAVENOUS

## 2017-06-23 MED ORDER — SUGAMMADEX SODIUM 200 MG/2ML IV SOLN
INTRAVENOUS | Status: AC
  Filled 2017-06-23: qty 2

## 2017-06-23 MED ORDER — FENTANYL CITRATE (PF) 250 MCG/5ML IJ SOLN
INTRAMUSCULAR | Status: AC
  Filled 2017-06-23: qty 5

## 2017-06-23 MED ORDER — CEFAZOLIN SODIUM 1 G IJ SOLR
INTRAMUSCULAR | Status: AC
  Filled 2017-06-23: qty 20

## 2017-06-23 MED ORDER — PANTOPRAZOLE SODIUM 40 MG PO TBEC
40.0000 mg | DELAYED_RELEASE_TABLET | Freq: Every day | ORAL | Status: DC
  Administered 2017-06-24 – 2017-06-25 (×2): 40 mg via ORAL
  Filled 2017-06-23 (×2): qty 1

## 2017-06-23 MED ORDER — 0.9 % SODIUM CHLORIDE (POUR BTL) OPTIME
TOPICAL | Status: DC | PRN
  Administered 2017-06-23: 1000 mL

## 2017-06-23 MED ORDER — SUCCINYLCHOLINE CHLORIDE 200 MG/10ML IV SOSY
PREFILLED_SYRINGE | INTRAVENOUS | Status: AC
  Filled 2017-06-23: qty 10

## 2017-06-23 MED ORDER — KETOROLAC TROMETHAMINE 30 MG/ML IJ SOLN
INTRAMUSCULAR | Status: AC
Start: 2017-06-23 — End: ?
  Filled 2017-06-23: qty 1

## 2017-06-23 MED ORDER — CEFAZOLIN SODIUM-DEXTROSE 2-3 GM-%(50ML) IV SOLR
INTRAVENOUS | Status: DC | PRN
  Administered 2017-06-23: 2 g via INTRAVENOUS

## 2017-06-23 MED ORDER — ROCURONIUM BROMIDE 10 MG/ML (PF) SYRINGE
PREFILLED_SYRINGE | INTRAVENOUS | Status: AC
  Filled 2017-06-23: qty 5

## 2017-06-23 MED ORDER — LACTATED RINGERS IV SOLN
INTRAVENOUS | Status: DC | PRN
Start: 2017-06-23 — End: 2017-06-23
  Administered 2017-06-23: 22:00:00 via INTRAVENOUS

## 2017-06-23 MED ORDER — PROPOFOL 10 MG/ML IV BOLUS
INTRAVENOUS | Status: AC
  Filled 2017-06-23: qty 20

## 2017-06-23 MED ORDER — DEXTROSE 5 % IV SOLN
INTRAVENOUS | Status: DC | PRN
  Administered 2017-06-23: 50 ug/min via INTRAVENOUS

## 2017-06-23 MED ORDER — FUROSEMIDE 40 MG PO TABS
40.0000 mg | ORAL_TABLET | Freq: Every day | ORAL | Status: DC
  Administered 2017-06-24 – 2017-06-25 (×2): 40 mg via ORAL
  Filled 2017-06-23 (×2): qty 1

## 2017-06-23 MED ORDER — ONDANSETRON 4 MG PO TBDP
ORAL_TABLET | ORAL | Status: AC
  Filled 2017-06-23: qty 1

## 2017-06-23 MED ORDER — BUPIVACAINE HCL (PF) 0.25 % IJ SOLN
INTRAMUSCULAR | Status: AC
  Filled 2017-06-23: qty 30

## 2017-06-23 MED ORDER — SUGAMMADEX SODIUM 200 MG/2ML IV SOLN
INTRAVENOUS | Status: DC | PRN
Start: 2017-06-23 — End: 2017-06-23
  Administered 2017-06-23: 200 mg via INTRAVENOUS

## 2017-06-23 MED ORDER — DILTIAZEM HCL ER COATED BEADS 120 MG PO CP24
120.0000 mg | ORAL_CAPSULE | Freq: Every day | ORAL | Status: DC
  Administered 2017-06-24 – 2017-06-25 (×2): 120 mg via ORAL
  Filled 2017-06-23 (×2): qty 1

## 2017-06-23 MED ORDER — KETOROLAC TROMETHAMINE 30 MG/ML IJ SOLN
30.0000 mg | Freq: Once | INTRAMUSCULAR | Status: AC
  Administered 2017-06-23: 30 mg via INTRAMUSCULAR

## 2017-06-23 MED ORDER — FENTANYL CITRATE (PF) 100 MCG/2ML IJ SOLN
INTRAMUSCULAR | Status: AC
  Filled 2017-06-23: qty 2

## 2017-06-23 MED ORDER — ONDANSETRON HCL 4 MG/2ML IJ SOLN
INTRAMUSCULAR | Status: DC | PRN
  Administered 2017-06-23: 4 mg via INTRAVENOUS

## 2017-06-23 MED ORDER — ROCURONIUM 10MG/ML (10ML) SYRINGE FOR MEDFUSION PUMP - OPTIME
INTRAVENOUS | Status: DC | PRN
  Administered 2017-06-23 (×2): 10 mg via INTRAVENOUS
  Administered 2017-06-23: 30 mg via INTRAVENOUS

## 2017-06-23 MED ORDER — LIDOCAINE HCL (CARDIAC) 20 MG/ML IV SOLN
INTRAVENOUS | Status: DC | PRN
  Administered 2017-06-23: 20 mg via INTRATRACHEAL

## 2017-06-23 MED ORDER — ONDANSETRON 4 MG PO TBDP
4.0000 mg | ORAL_TABLET | Freq: Once | ORAL | Status: AC
  Administered 2017-06-23: 4 mg via ORAL

## 2017-06-23 SURGICAL SUPPLY — 46 items
BLADE CLIPPER SURG (BLADE) ×3 IMPLANT
CANISTER SUCT 3000ML PPV (MISCELLANEOUS) ×3 IMPLANT
CHLORAPREP W/TINT 26ML (MISCELLANEOUS) ×3 IMPLANT
COVER SURGICAL LIGHT HANDLE (MISCELLANEOUS) ×3 IMPLANT
DERMABOND ADHESIVE PROPEN (GAUZE/BANDAGES/DRESSINGS) ×2
DERMABOND ADVANCED (GAUZE/BANDAGES/DRESSINGS) ×2
DERMABOND ADVANCED .7 DNX12 (GAUZE/BANDAGES/DRESSINGS) ×1 IMPLANT
DERMABOND ADVANCED .7 DNX6 (GAUZE/BANDAGES/DRESSINGS) ×1 IMPLANT
DRAIN PENROSE 1/2X12 LTX STRL (WOUND CARE) IMPLANT
DRAPE LAPAROTOMY TRNSV 102X78 (DRAPE) ×3 IMPLANT
DRAPE UTILITY XL STRL (DRAPES) ×6 IMPLANT
ELECT CAUTERY BLADE 6.4 (BLADE) ×3 IMPLANT
ELECT REM PT RETURN 9FT ADLT (ELECTROSURGICAL) ×3
ELECTRODE REM PT RTRN 9FT ADLT (ELECTROSURGICAL) ×1 IMPLANT
GLOVE BIO SURGEON STRL SZ8 (GLOVE) ×3 IMPLANT
GLOVE BIOGEL PI IND STRL 8 (GLOVE) ×1 IMPLANT
GLOVE BIOGEL PI INDICATOR 8 (GLOVE) ×2
GOWN STRL REUS W/ TWL LRG LVL3 (GOWN DISPOSABLE) ×1 IMPLANT
GOWN STRL REUS W/ TWL XL LVL3 (GOWN DISPOSABLE) ×1 IMPLANT
GOWN STRL REUS W/TWL LRG LVL3 (GOWN DISPOSABLE) ×2
GOWN STRL REUS W/TWL XL LVL3 (GOWN DISPOSABLE) ×2
KIT BASIN OR (CUSTOM PROCEDURE TRAY) ×3 IMPLANT
KIT ROOM TURNOVER OR (KITS) ×3 IMPLANT
MESH HERNIA SYS ULTRAPRO LRG (Mesh General) ×3 IMPLANT
NEEDLE HYPO 25GX1X1/2 BEV (NEEDLE) ×3 IMPLANT
NS IRRIG 1000ML POUR BTL (IV SOLUTION) ×3 IMPLANT
PACK SURGICAL SETUP 50X90 (CUSTOM PROCEDURE TRAY) ×3 IMPLANT
PAD ARMBOARD 7.5X6 YLW CONV (MISCELLANEOUS) ×3 IMPLANT
PENCIL BUTTON HOLSTER BLD 10FT (ELECTRODE) ×3 IMPLANT
SPONGE LAP 18X18 X RAY DECT (DISPOSABLE) ×3 IMPLANT
SUT MNCRL AB 4-0 PS2 18 (SUTURE) ×3 IMPLANT
SUT NOVA NAB DX-16 0-1 5-0 T12 (SUTURE) ×9 IMPLANT
SUT SILK 2 0 SH (SUTURE) IMPLANT
SUT VIC AB 0 CT1 27 (SUTURE)
SUT VIC AB 0 CT1 27XBRD ANBCTR (SUTURE) IMPLANT
SUT VIC AB 2-0 SH 27 (SUTURE) ×4
SUT VIC AB 2-0 SH 27X BRD (SUTURE) ×2 IMPLANT
SUT VIC AB 3-0 SH 18 (SUTURE) ×3 IMPLANT
SUT VICRYL AB 3 0 TIES (SUTURE) ×3 IMPLANT
SYR BULB 3OZ (MISCELLANEOUS) IMPLANT
SYR CONTROL 10ML LL (SYRINGE) ×3 IMPLANT
TOWEL OR 17X24 6PK STRL BLUE (TOWEL DISPOSABLE) IMPLANT
TOWEL OR 17X26 10 PK STRL BLUE (TOWEL DISPOSABLE) ×3 IMPLANT
TUBE CONNECTING 12'X1/4 (SUCTIONS) ×1
TUBE CONNECTING 12X1/4 (SUCTIONS) ×2 IMPLANT
YANKAUER SUCT BULB TIP NO VENT (SUCTIONS) ×3 IMPLANT

## 2017-06-23 NOTE — Transfer of Care (Signed)
Immediate Anesthesia Transfer of Care Note  Patient: Alexandra Reyes  Procedure(s) Performed: REPAIR INCARCERATED  RIGHT FEMORAL HERNIA WITH MESH (Right Groin)  Patient Location: PACU  Anesthesia Type:General  Level of Consciousness: awake, alert  and oriented  Airway & Oxygen Therapy: Patient connected to nasal cannula oxygen  Post-op Assessment: Report given to RN and Post -op Vital signs reviewed and stable  Post vital signs: Reviewed and stable  Last Vitals:  Vitals:   06/23/17 2030 06/23/17 2340  BP: (!) 158/86 (!) 169/91  Pulse: 98 91  Resp: 20 18  Temp:    SpO2: 98% 100%    Last Pain:  Vitals:   06/23/17 2124  TempSrc:   PainSc: 3          Complications: No apparent anesthesia complications

## 2017-06-23 NOTE — H&P (Signed)
Alexandra Reyes is an 84 y.o. female.   Chief Complaint: Right groin pain nausea and vomiting times 1 day HPI: Patient presents emergency room with 1 day history of right groin pain, nausea and vomiting.  She still having bowel movements and passing gas but she has severe right groin pain.  Evaluation revealed a mass in her right groin and CT scan show what appeared to be an incarcerated right femoral hernia with small bowel obstruction.  She continues to have pain.  The hernia is nonreducible per the EDP who tried.  Her pain is controlled with narcotics but is not resolving completely.  Past Medical History:  Diagnosis Date  . Age-related macular degeneration, wet, both eyes ([place])   . Alcohol dependence in remission ([place]) 05/22/2011  . Allergic rhinitis, cause unspecified 05/22/2011  . Allergy   . Anemia, iron deficiency 05/18/2011  . Cholelithiasis   . COPD (chronic obstructive pulmonary disease) ([place])   . DDD (degenerative disc disease), lumbar 05/18/2011  . Depression    "@ times" (09/19/2016)  . First degree AV block   . GERD (gastroesophageal reflux disease)   . Hiatal hernia   . History of blood transfusion    "3 or 4 related to childbirth"  . HTN (hypertension) 05/18/2011   pt denies this hx on 09/19/2016  . Hyperlipidemia 05/18/2011  . Macular degeneration   . Myocardial infarction ([place])    "EKG shows I've had 2; date unknown" (09/19/2016)  . OA (osteoarthritis)    "a little here and there; mainly in the back" (09/19/2016)  . Osteoporosis 05/18/2011  . PAT (paroxysmal atrial tachycardia) ([place])   . Pectus excavatum 05/18/2011  . Personal history of colonic polyps 10/28/2012  . Pneumonia    "as a child"  . SVT (supraventricular tachycardia) ([place])   . Urinary frequency     Past Surgical History:  Procedure Laterality Date  . ABDOMINAL HYSTERECTOMY  1988  . BREAST BIOPSY Right 1948   "it was ok"  . CARDIAC CATHETERIZATION    . CARDIOVASCULAR STRESS TEST  03/08/2009   EF 84%    . CATARACT EXTRACTION W/ INTRAOCULAR LENS  IMPLANT, BILATERAL Bilateral   . KNEE ARTHROSCOPY Right   . RIGHT/LEFT HEART CATH AND CORONARY ANGIOGRAPHY N/A 07/25/2016   Procedure: Right/Left Heart Cath and Coronary Angiography;  Surgeon: Burnell Blanks, MD;  Location: [facility];  Service: Cardiovascular;  Laterality: N/A;  . SVT ABLATION  09/19/2016  . SVT ABLATION N/A 09/19/2016   Procedure: SVT Ablation;  Surgeon: Thompson Grayer, MD;  Location: [facility];  Service: Cardiovascular;  Laterality: N/A;  . TONSILLECTOMY AND ADENOIDECTOMY    . US ECHOCARDIOGRAPHY  01/17/2003   EF 55-60%    Family History  Problem Relation Age of Onset  . Heart failure Mother   . Arthritis Mother   . Kidney failure Father   . Heart disease Father   . Hypertension Father    Social History:  reports that she quit smoking about 23 years ago. Her smoking use included cigarettes. She started smoking about 58 years ago. She has a 54.00 pack-year smoking history. she has never used smokeless tobacco. She reports that she drinks alcohol. She reports that she does not use drugs.  Allergies:  Allergies  Allergen Reactions  . Symbicort [Budesonide-Formoterol Fumarate]     Jittery in high doses, this has been reduced and is tolerated at this time.      (Not in a hospital admission)  Results  for orders placed or performed during the hospital encounter of 06/23/17 (from the past 48 hour(s))  Comprehensive metabolic panel     Status: Abnormal   Collection Time: 06/23/17  3:50 PM  Result Value Ref Range   Sodium 131 (L) 135 - 145 mmol/L   Potassium 3.6 3.5 - 5.1 mmol/L   Chloride 96 (L) 101 - 111 mmol/L   CO2 20 (L) 22 - 32 mmol/L   Glucose, Bld 107 (H) 65 - 99 mg/dL   BUN 13 6 - 20 mg/dL   Creatinine, Ser 0.99 0.44 - 1.00 mg/dL   Calcium 9.6 8.9 - 10.3 mg/dL   Total Protein 7.3 6.5 - 8.1 g/dL   Albumin 4.3 3.5 - 5.0 g/dL   AST 30 15 - 41 U/L   ALT 20 14 - 54 U/L   Alkaline Phosphatase  90 38 - 126 U/L   Total Bilirubin 1.0 0.3 - 1.2 mg/dL   GFR calc non Af Amer 49 (L) >60 mL/min   GFR calc Af Amer 57 (L) >60 mL/min    Comment: (NOTE) The eGFR has been calculated using the CKD EPI equation. This calculation has not been validated in all clinical situations. eGFR's persistently <60 mL/min signify possible Chronic Kidney Disease.    Anion gap 15 5 - 15  Lipase, blood     Status: None   Collection Time: 06/23/17  3:50 PM  Result Value Ref Range   Lipase 38 11 - 51 U/L  CBC with Differential     Status: Abnormal   Collection Time: 06/23/17  3:50 PM  Result Value Ref Range   WBC 12.0 (H) 4.0 - 10.5 K/uL   RBC 5.71 (H) 3.87 - 5.11 MIL/uL   Hemoglobin 17.3 (H) 12.0 - 15.0 g/dL   HCT 48.9 (H) 36.0 - 46.0 %   MCV 85.6 78.0 - 100.0 fL   MCH 30.3 26.0 - 34.0 pg   MCHC 35.4 30.0 - 36.0 g/dL   RDW 13.7 11.5 - 15.5 %   Platelets 351 150 - 400 K/uL   Neutrophils Relative % 87 %   Neutro Abs 10.5 (H) 1.7 - 7.7 K/uL   Lymphocytes Relative 7 %   Lymphs Abs 0.8 0.7 - 4.0 K/uL   Monocytes Relative 6 %   Monocytes Absolute 0.7 0.1 - 1.0 K/uL   Eosinophils Relative 0 %   Eosinophils Absolute 0.0 0.0 - 0.7 K/uL   Basophils Relative 0 %   Basophils Absolute 0.0 0.0 - 0.1 K/uL  Urinalysis, Routine w reflex microscopic     Status: Abnormal   Collection Time: 06/23/17  5:59 PM  Result Value Ref Range   Color, Urine YELLOW YELLOW   APPearance CLEAR CLEAR   Specific Gravity, Urine 1.005 1.005 - 1.030   pH 7.0 5.0 - 8.0   Glucose, UA NEGATIVE NEGATIVE mg/dL   Hgb urine dipstick SMALL (A) NEGATIVE   Bilirubin Urine NEGATIVE NEGATIVE   Ketones, ur 5 (A) NEGATIVE mg/dL   Protein, ur NEGATIVE NEGATIVE mg/dL   Nitrite NEGATIVE NEGATIVE   Leukocytes, UA NEGATIVE NEGATIVE   RBC / HPF 6-30 0 - 5 RBC/hpf   WBC, UA NONE SEEN 0 - 5 WBC/hpf   Bacteria, UA NONE SEEN NONE SEEN   Squamous Epithelial / LPF 0-5 (A) NONE SEEN   Ct Abdomen Pelvis W Contrast  Result Date:  06/23/2017 CLINICAL DATA:  Nausea and vomiting. EXAM: CT ABDOMEN AND PELVIS WITH CONTRAST TECHNIQUE: Multidetector CT imaging of the abdomen  and pelvis was performed using the standard protocol following bolus administration of intravenous contrast. CONTRAST:  126m ISOVUE-300 IOPAMIDOL (ISOVUE-300) INJECTION 61% COMPARISON:  100 cc of Isovue-300 FINDINGS: Lower chest: No acute abnormality. Hepatobiliary: No focal liver abnormality is seen. No gallstones, gallbladder wall thickening, or biliary dilatation. Pancreas: Unremarkable. No pancreatic ductal dilatation or surrounding inflammatory changes. Spleen: Normal in size without focal abnormality. Adrenals/Urinary Tract: Normal adrenal glands. Bilateral renal cortical scarring identified. There are several right kidney cysts. No hydronephrosis or mass identified. Urinary bladder appears unremarkable. Stomach/Bowel: The stomach is normal. There is pathologic dilatation of the proximal and mid small bowel loops. Transition point to decreased caliber small bowel loops is within the right inguinal hernia. Unremarkable appearance of the:. Vascular/Lymphatic: Aortic atherosclerosis. No aneurysm. No upper abdominal or pelvic adenopathy. No inguinal adenopathy. Reproductive: Status post hysterectomy. No adnexal masses. Other: No free fluid or fluid collections. Musculoskeletal: No acute or significant osseous findings. Scoliosis and degenerative disc disease identified. IMPRESSION: 1. Examination is positive for small bowel obstruction. 2. Right inguinal hernia containing obstructed loop of small bowel represents the transition point for the obstruction. 3.  Aortic Atherosclerosis (ICD10-I70.0). 4. Bilateral renal cortical scarring. Electronically Signed   By: TKerby MoorsM.D.   On: 06/23/2017 19:43    Review of Systems  Constitutional: Negative for chills and fever.  HENT: Negative for hearing loss.   Eyes: Negative for blurred vision and double vision.   Cardiovascular: Negative for chest pain and palpitations.  Gastrointestinal: Positive for abdominal pain, nausea and vomiting.  Genitourinary: Positive for dysuria.  Musculoskeletal: Negative for myalgias.  Skin: Negative for rash.  Neurological: Negative for dizziness.  Psychiatric/Behavioral: Negative for depression.    Blood pressure (!) 158/86, pulse 98, temperature 98.4 F (36.9 C), temperature source Oral, resp. rate 20, height '5\' 5"'  (1.651 m), weight 61.2 kg (135 lb), SpO2 98 %. Physical Exam  Constitutional: She is oriented to person, place, and time. She appears well-developed and well-nourished.  HENT:  Head: Normocephalic and atraumatic.  Eyes: Pupils are equal, round, and reactive to light.  Neck: Normal range of motion. Neck supple.  Cardiovascular: Normal rate and regular rhythm.  Respiratory: Effort normal.  GI: Soft. She exhibits distension. There is tenderness. There is no rebound and no guarding.    Musculoskeletal: Normal range of motion.  Neurological: She is alert and oriented to person, place, and time.  Skin: Skin is warm and dry.  Psychiatric: She has a normal mood and affect. Her behavior is normal. Thought content normal.     Assessment/Plan Incarcerated right femoral hernia-patient requires emergent repair due to small bowel obstruction and concern for possible strangulation secondary to incarceration.  Risk, benefits and other options discussed.  Recommend open repair of right femoral hernia.  Discussed the use of mesh and the pros and cons of using mesh.  Discussed potential complications of surgery.  Discussed what happens with nonoperative management in the circumstances which is bowel necrosis and subsequent sepsis.  Son at bedside as well.  Both agree to proceed with surgical repair of incarcerated right femoral hernia.The risk of hernia repair include bleeding,  Infection,   Recurrence of the hernia,  Mesh use, chronic pain,  Organ injury,  Bowel  injury,  Bladder injury,   nerve injury with numbness around the incision,  Death,  and worsening of preexisting  medical problems.  The alternatives to surgery have been discussed as well..  Long term expectations of both operative and non operative treatments have  been discussed.   The patient agrees to proceed.  Turner Daniels, MD 06/23/2017, 8:54 PM

## 2017-06-23 NOTE — Op Note (Signed)
Preoperative diagnosis: Incarcerated right femoral hernia with small bowel obstruction   Postoperative diagnosis: Same  Procedure: Repair of incarcerated right femoral hernia with mesh  Surgeon: Erroll Luna MD  Anesthesia: General with local  EBL: 30 cc  Specimen: None  Drains: None  IV fluids: Per anesthesia record  Indications for procedure: The patient is an 84 year old female who presents with incarcerated right femoral hernia by physical examination and CT findings and small bowel obstruction.  I recommended operative repair of this to her.The risk of hernia repair include bleeding,  Infection,   Recurrence of the hernia,  Mesh use, chronic pain,  Organ injury,  Bowel injury,  Bladder injury,   nerve injury with numbness around the incision,  Death,  and worsening of preexisting  medical problems.  The alternatives to surgery have been discussed as well..  Long term expectations of both operative and non operative treatments have been discussed.   The patient agrees to proceed.  Description of procedure: The patient was met in the holding area and questions were answered.  The right side was marked as the correct side.  She was taken back to the operating room and placed supine on the OR table.  After induction of general anesthesia the right inguinal region with prepped and draped in sterile fashion and timeout was done.  Proper patient and procedure were verified.  She received 2 g of Ancef.  An oblique incision was made in the right groin.  Dissection was carried down through Scarpa's fascia to the aponeurosis of the external oblique.  The fibers were opened in the direction of the external ring.  The floor the inguinal canal was examined and opened.  It was very weak.  Upon entering the preperitoneal space the femoral hernia was identified.  It was carefully dissected out of the femoral canal.  The hernia sac was opened and small bowel examined.  There is signs of some ecchymoses on  the antimesenteric border of the small bowel but overall was viable and the obstruction was relieved by reduction of the small bowel back into the abdominal cavity.  The hernia sac was closed with 2-0 Vicryl.  This was reduced back in the preperitoneal space.  I used an ultra pro large mass within her leaflet placed in the preperitoneal space covering the femoral canal.  The onlay was then sewn into the floor the inguinal canal with care taken not to injure the femoral vessels or femoral nerves.  The ilioinguinal nerve was divided to prevent postoperative neuralgia.  Once the mesh was circumferentially tacked down to the Cooper's ligament and pubis inferiorly and laterally it was secured to the internal oblique medially circumferentially.  I then closed a layer of the internal oblique over the mesh.  This reinforced and closed the floor as well as the femoral canal.  I had to divide the inguinal ligament to get access to the hernia and I repaired this with #1 Novafil.  Care was taken not to injure the femoral vessels.  I then closed the external oblique with 2-0 Vicryl.  3-0 Vicryl was used to close Scarpa's fascia and 4-0 Monocryl for skin.  All final counts were found to be correct.  Dermabond placed.  The patient was awoke extubated taken to recovery in satisfactory condition.

## 2017-06-23 NOTE — ED Provider Notes (Signed)
  MRN: [id number] DOB: 20-Feb-1926  Subjective:   Alexandra Reyes is a 84 y.o. female presenting for sudden onset of acute lower right sided belly pain, mass. Patient reports pain is severe, constant, feels a mass in her lower right abdomen that hurts to press on. Denies fever, diarrhea, bloody stools, constipation, trauma, bruising. Has a history of rectocele but has checked for this and states she is fine, has had 3 bowel movements. She did try taking Tramadol and has not had relief from this. Denies history of inguinal hernia but has had hiatal hernia. Last creatinine is 0.83 on 03/12/2017. Denies history of liver or kidney disease.   Alexandra Reyes is allergic to symbicort [budesonide-formoterol fumarate].  Alexandra Reyes  has a past medical history of Age-related macular degeneration, wet, both eyes ([facility]), Alcohol dependence in remission ([facility]) (05/22/2011), Allergic rhinitis, cause unspecified (05/22/2011), Allergy, Anemia, iron deficiency (05/18/2011), Cholelithiasis, COPD (chronic obstructive pulmonary disease) ([facility]), DDD (degenerative disc disease), lumbar (05/18/2011), Depression, First degree AV block, GERD (gastroesophageal reflux disease), Hiatal hernia, History of blood transfusion, HTN (hypertension) (05/18/2011), Hyperlipidemia (05/18/2011), Macular degeneration, Myocardial infarction ([facility]), OA (osteoarthritis), Osteoporosis (05/18/2011), PAT (paroxysmal atrial tachycardia) ([facility]), Pectus excavatum (05/18/2011), Personal history of colonic polyps (10/28/2012), Pneumonia, SVT (supraventricular tachycardia) ([facility]), and Urinary frequency. Also  has a past surgical history that includes Tonsillectomy and adenoidectomy; US ECHOCARDIOGRAPHY (01/17/2003); Cardiovascular stress test (03/08/2009); Abdominal hysterectomy (1988); RIGHT/LEFT HEART CATH AND CORONARY ANGIOGRAPHY (N/A, 07/25/2016); SVT ABLATION (09/19/2016); Knee arthroscopy (Right); Cardiac catheterization; Cataract extraction w/ intraocular lens  implant, bilateral  (Bilateral); Breast biopsy (Right, 1948); and SVT ABLATION (N/A, 09/19/2016). Her family history includes Arthritis in her mother; Heart disease in her father; Heart failure in her mother; Hypertension in her father; Kidney failure in her father.   Objective:   Vitals: BP (!) 175/83 (BP Location: Left Arm) Comment: Notified RN  Pulse (!) 108 Comment: Notified RN  Temp 98 F (36.7 C) (Oral)   Resp 20   LMP  (LMP Unknown)   SpO2 96%   BP Readings from Last 3 Encounters:  06/23/17 (!) 175/83  05/14/17 132/74  04/09/17 130/70   Physical Exam  Constitutional: She is oriented to person, place, and time. She appears well-developed and well-nourished.  Cardiovascular: Normal rate, regular rhythm and intact distal pulses. Exam reveals no gallop and no friction rub.  No murmur heard. Pulmonary/Chest: No respiratory distress. She has no wheezes. She has no rales.  Abdominal: Soft. Bowel sounds are normal. She exhibits mass (firm, non-reducible likely due to severe pain with palpation). She exhibits no distension. There is tenderness. There is no rebound and no guarding.    Neurological: She is alert and oriented to person, place, and time.   Assessment and Plan :   Sudden onset of severe abdominal pain  Right lower quadrant abdominal mass   Will refer to ER for evaluation, hopefully CT imaging given acute onset, severity and palpable firm mass. Patient given IM Toradol 30mg , Zofran ODT in clinic. Counseled patient on potential for adverse effects with medications prescribed today, patient verbalized understanding. She agreed to present to ER immediately.   Wallis BambergMani, Marieta Markov, PA-C 06/23/17 1425

## 2017-06-23 NOTE — ED Notes (Signed)
Pt ambulated to bathroom 

## 2017-06-23 NOTE — Anesthesia Procedure Notes (Signed)
Procedure Name: Intubation Date/Time: 06/23/2017 10:13 PM Performed by: Molli HazardGordon, Vivika Poythress M, CRNA Pre-anesthesia Checklist: Patient identified, Emergency Drugs available, Suction available and Patient being monitored Patient Re-evaluated:Patient Re-evaluated prior to induction Oxygen Delivery Method: Circle system utilized Preoxygenation: Pre-oxygenation with 100% oxygen Induction Type: IV induction, Rapid sequence and Cricoid Pressure applied Laryngoscope Size: Miller and 2 Grade View: Grade II Tube type: Subglottic suction tube Tube size: 7.5 mm Number of attempts: 1 Airway Equipment and Method: Stylet Placement Confirmation: ETT inserted through vocal cords under direct vision,  positive ETCO2 and breath sounds checked- equal and bilateral Secured at: 23 cm Tube secured with: Tape Dental Injury: Teeth and Oropharynx as per pre-operative assessment

## 2017-06-23 NOTE — Discharge Instructions (Signed)
Please report to ER for your severe abdominal pain. You will be evaluated by a different medical team there and will likely need imaging.

## 2017-06-23 NOTE — ED Triage Notes (Signed)
Pt c/o area in her groin that hurts starting this morning, tender to palpation. Denies issues with urination.

## 2017-06-23 NOTE — Anesthesia Preprocedure Evaluation (Addendum)
Anesthesia Evaluation  Patient identified by MRN, date of birth, ID band Patient awake    Reviewed: Allergy & Precautions, NPO status , Patient's Chart, lab work & pertinent test results  History of Anesthesia Complications Negative for: history of anesthetic complications  Airway Mallampati: II  TM Distance: >3 FB Neck ROM: Full    Dental  (+) Caps, Dental Advisory Given   Pulmonary COPD,  COPD inhaler, former smoker (quit 1996),    breath sounds clear to auscultation       Cardiovascular hypertension, Pt. 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 The mid and distal vessel fills briskly from left to right collaterals,  Mild non-obstructive disease in the LAD, Normal filling pressures.   '17 ECHO: EF 55-60%, valves OK   Neuro/Psych Depression negative neurological ROS     GI/Hepatic GERD  Medicated and Controlled,(+)     substance abuse  alcohol use, incarcerated right femoral hernia with small bowel obstruction   Endo/Other  negative endocrine ROS  Renal/GU Renal InsufficiencyRenal disease     Musculoskeletal   Abdominal   Peds  Hematology negative hematology ROS (+)   Anesthesia Other Findings   Reproductive/Obstetrics                            Anesthesia Physical Anesthesia Plan  ASA: III and emergent  Anesthesia Plan: General   Post-op Pain Management:    Induction: Intravenous and Rapid sequence  PONV Risk Score and Plan: 3 and Ondansetron, Dexamethasone and Treatment may vary due to age or medical condition  Airway Management Planned: Oral ETT  Additional Equipment:   Intra-op Plan:   Post-operative Plan: Extubation in OR  Informed Consent: I have reviewed the patients History and Physical, chart, labs and discussed the procedure  including the risks, benefits and alternatives for the proposed anesthesia with the patient or authorized representative who has indicated his/her understanding and acceptance.   Dental advisory given  Plan Discussed with: CRNA and Surgeon  Anesthesia Plan Comments: (Plan routine monitors, GETA)        Anesthesia Quick Evaluation

## 2017-06-23 NOTE — ED Provider Notes (Signed)
Patient placed in Quick Look pathway, seen and evaluated   Chief Complaint: Groin pain  HPI:   84 year old female presents today with complaints of groin pain.  Patient reports that 10 AM this morning she noticed pain to her right groin and a mass.  She denies any history of the same.  Patient notes she has had 3 bowel movements today has had nausea no vomiting, denies fever.  Patient took tramadol prior to arrival without significant improvement in her symptoms.  She was seen in urgent care and referred to the emergency room for further evaluation.  ROS: Positive for abdominal pain, positive for mass, negative for diarrhea or constipation, negative for fever (one)  Physical Exam:   Gen: No distress  Neuro: Awake and Alert  Skin: Warm    Focused Exam: Right lower abdomen with obvious mass in the inguinal region, not reducible-remainder of abdominal exam without acute findings  84 year old female presents today with likely hernia.  She will require acute bed, pain medication and attempt at reduction which cannot be accomplished in triage.  CT scan ordered pending reduction in acute bed.   Initiation of care has begun. The patient has been counseled on the process, plan, and necessity for staying for the completion/evaluation, and the remainder of the medical screening examination    Eyvonne MechanicHedges, Aniyla Harling, Cordelia Poche-C 06/23/17 1604    Jacalyn LefevreHaviland, Julie, MD 06/23/17 1630

## 2017-06-23 NOTE — ED Triage Notes (Signed)
Per Pt, Pt is coming from UC with complaints of pain in her right groin that started this morning with a bulge. Denies any N/V/D

## 2017-06-23 NOTE — ED Provider Notes (Signed)
Emergency Department Provider Note   I have reviewed the triage vital signs and the nursing notes.   HISTORY  Chief Complaint Groin Pain   HPI Alexandra Reyes is a 84 y.o. female with PMH of COPD, CAD, HLD, PAT presents to the emergency department for evaluation of right inguinal pain with a palpable mass.  The patient states that she was reading the paper and drinking coffee this morning when she felt the need to have a bowel movement.  She went to the bathroom and had several large bowel movements without straining.  She noticed pain afterwards in the right inguinal area with a palpable mass.  No history of hernia in the past.  No other modifying factors.  She continues to pass gas and had a third bowel movement after the mass appeared.  No vomiting.  No fevers or chills.   Past Medical History:  Diagnosis Date  . Age-related macular degeneration, wet, both eyes (HCC)   . Alcohol dependence in remission (HCC) 05/22/2011  . Allergic rhinitis, cause unspecified 05/22/2011  . Allergy   . Anemia, iron deficiency 05/18/2011  . Cholelithiasis   . COPD (chronic obstructive pulmonary disease) (HCC)   . DDD (degenerative disc disease), lumbar 05/18/2011  . Depression    "@ times" (09/19/2016)  . First degree AV block   . GERD (gastroesophageal reflux disease)   . Hiatal hernia   . History of blood transfusion    "3 or 4 related to childbirth"  . HTN (hypertension) 05/18/2011   pt denies this hx on 09/19/2016  . Hyperlipidemia 05/18/2011  . Macular degeneration   . Myocardial infarction (HCC)    "EKG shows I've had 2; date unknown" (09/19/2016)  . OA (osteoarthritis)    "a little here and there; mainly in the back" (09/19/2016)  . Osteoporosis 05/18/2011  . PAT (paroxysmal atrial tachycardia) (HCC)   . Pectus excavatum 05/18/2011  . Personal history of colonic polyps 10/28/2012  . Pneumonia    "as a child"  . SVT (supraventricular tachycardia) (HCC)   . Urinary frequency      Patient Active Problem List   Diagnosis Date Noted  . Femoral hernia of right side with obstruction 06/23/2017  . CKD (chronic kidney disease), stage III (HCC) 03/12/2017  . Weight loss 11/27/2016  . SVT (supraventricular tachycardia) (HCC) 09/19/2016  . Left-sided nosebleed 09/11/2016  . Coronary artery disease involving native coronary artery of native heart without angina pectoris   . ILD (interstitial lung disease) (HCC) 05/22/2016  . Constipation 03/13/2016  . Bronchiectasis without acute exacerbation (HCC) 03/13/2016  . Chest pain 08/30/2015  . Dyspnea 08/02/2015  . Bilateral leg pain 02/01/2015  . Diastolic dysfunction 02/01/2015  . Peripheral edema 02/01/2015  . Abnormal EKG 05/18/2014  . Syncope 05/18/2014  . Dyslipidemia 05/14/2013  . Dyspnea on exertion 05/14/2013  . DOE (dyspnea on exertion) 04/28/2013  . Personal history of colonic polyps 10/28/2012  . Osteopenia 04/29/2012  . Elevated digoxin level 04/29/2012  . Allergic rhinitis, cause unspecified 05/22/2011  . Alcohol dependence in remission (HCC) 05/22/2011  . Depression 05/22/2011  . Preventative health care 05/18/2011  . HTN (hypertension) 05/18/2011  . Hyperlipidemia 05/18/2011  . Anemia, iron deficiency 05/18/2011  . GERD (gastroesophageal reflux disease) 05/18/2011  . DDD (degenerative disc disease), lumbar 05/18/2011  . Pectus excavatum 05/18/2011  . Macular degeneration   . OA (osteoarthritis)   . Cholelithiasis   . COPD (chronic obstructive pulmonary disease) (HCC)   . Paroxysmal SVT (supraventricular  tachycardia) (HCC) 02/07/2011    Past Surgical History:  Procedure Laterality Date  . ABDOMINAL HYSTERECTOMY  1988  . BREAST BIOPSY Right 1948   "it was ok"  . CARDIAC CATHETERIZATION    . CARDIOVASCULAR STRESS TEST  03/08/2009   EF 84%  . CATARACT EXTRACTION W/ INTRAOCULAR LENS  IMPLANT, BILATERAL Bilateral   . KNEE ARTHROSCOPY Right   . RIGHT/LEFT HEART CATH AND CORONARY ANGIOGRAPHY N/A  07/25/2016   Procedure: Right/Left Heart Cath and Coronary Angiography;  Surgeon: Kathleene Hazel, MD;  Location: [facility] INVASIVE CV LAB;  Service: Cardiovascular;  Laterality: N/A;  . SVT ABLATION  09/19/2016  . SVT ABLATION N/A 09/19/2016   Procedure: SVT Ablation;  Surgeon: Hillis Range, MD;  Location: [facility] INVASIVE CV LAB;  Service: Cardiovascular;  Laterality: N/A;  . TONSILLECTOMY AND ADENOIDECTOMY    . US ECHOCARDIOGRAPHY  01/17/2003   EF 55-60%      Allergies Symbicort [budesonide-formoterol fumarate]  Family History  Problem Relation Age of Onset  . Heart failure Mother   . Arthritis Mother   . Kidney failure Father   . Heart disease Father   . Hypertension Father     Social History Social History   Tobacco Use  . Smoking status: Former Smoker    Packs/day: 1.50    Years: 36.00    Pack years: 54.00    Types: Cigarettes    Start date: 03/01/1959    Last attempt to quit: 06/10/1994    Years since quitting: 23.0  . Smokeless tobacco: Never Used  . Tobacco comment: smoked 1- 1.5 ppd.   Substance Use Topics  . Alcohol use: Yes    Alcohol/week: 0.0 oz    Comment: 09/19/2016 "quit drinking in ~ 1978"  . Drug use: No    Review of Systems  Constitutional: No fever/chills Eyes: No visual changes. ENT: No sore throat. Cardiovascular: Denies chest pain. Respiratory: Denies shortness of breath. Gastrointestinal: Positive right inguinal pain with mass. No nausea, no vomiting. No diarrhea. No constipation. Genitourinary: Negative for dysuria. Musculoskeletal: Negative for back pain. Skin: Negative for rash. Neurological: Negative for headaches, focal weakness or numbness.  10-point ROS otherwise negative.  ____________________________________________   PHYSICAL EXAM:  VITAL SIGNS: ED Triage Vitals  Enc Vitals Group     BP 06/23/17 1500 (!) 174/104     Pulse Rate 06/23/17 1500 (!) 115     Resp 06/23/17 1500 20     Temp 06/23/17 1500 98.4 F (36.9 C)     Temp  Source 06/23/17 1500 Oral     SpO2 06/23/17 1500 99 %     Weight 06/23/17 1508 135 lb (61.2 kg)     Height 06/23/17 1508 5\' 5"  (1.651 m)     Pain Score 06/23/17 1508 10   Constitutional: Alert and oriented. Well appearing and in no acute distress. Eyes: Conjunctivae are normal. Head: Atraumatic. Nose: No congestion/rhinnorhea. Mouth/Throat: Mucous membranes are moist.  Neck: No stridor. Cardiovascular: Normal rate, regular rhythm. Good peripheral circulation. Grossly normal heart sounds.   Respiratory: Normal respiratory effort.  No retractions. Lungs CTAB. Gastrointestinal: Soft and nontender. Right inguinal mass not reducible with constant pressure. No overlying skin changes. No distention.  Musculoskeletal: No lower extremity tenderness nor edema. No gross deformities of extremities. Neurologic:  Normal speech and language. No gross focal neurologic deficits are appreciated.  Skin:  Skin is warm, dry and intact. No rash noted.  ____________________________________________   LABS (all labs ordered are listed, but only abnormal results  are displayed)  Labs Reviewed  COMPREHENSIVE METABOLIC PANEL - Abnormal; Notable for the following components:      Result Value   Sodium 131 (*)    Chloride 96 (*)    CO2 20 (*)    Glucose, Bld 107 (*)    GFR calc non Af Amer 49 (*)    GFR calc Af Amer 57 (*)    All other components within normal limits  URINALYSIS, ROUTINE W REFLEX MICROSCOPIC - Abnormal; Notable for the following components:   Hgb urine dipstick SMALL (*)    Ketones, ur 5 (*)    Squamous Epithelial / LPF 0-5 (*)    All other components within normal limits  CBC WITH DIFFERENTIAL/PLATELET - Abnormal; Notable for the following components:   WBC 12.0 (*)    RBC 5.71 (*)    Hemoglobin 17.3 (*)    HCT 48.9 (*)    Neutro Abs 10.5 (*)    All other components within normal limits  LIPASE, BLOOD   ____________________________________________  RADIOLOGY  Ct Abdomen Pelvis  W Contrast  Result Date: 06/23/2017 CLINICAL DATA:  Nausea and vomiting. EXAM: CT ABDOMEN AND PELVIS WITH CONTRAST TECHNIQUE: Multidetector CT imaging of the abdomen and pelvis was performed using the standard protocol following bolus administration of intravenous contrast. CONTRAST:  100mL ISOVUE-300 IOPAMIDOL (ISOVUE-300) INJECTION 61% COMPARISON:  100 cc of Isovue-300 FINDINGS: Lower chest: No acute abnormality. Hepatobiliary: No focal liver abnormality is seen. No gallstones, gallbladder wall thickening, or biliary dilatation. Pancreas: Unremarkable. No pancreatic ductal dilatation or surrounding inflammatory changes. Spleen: Normal in size without focal abnormality. Adrenals/Urinary Tract: Normal adrenal glands. Bilateral renal cortical scarring identified. There are several right kidney cysts. No hydronephrosis or mass identified. Urinary bladder appears unremarkable. Stomach/Bowel: The stomach is normal. There is pathologic dilatation of the proximal and mid small bowel loops. Transition point to decreased caliber small bowel loops is within the right inguinal hernia. Unremarkable appearance of the:. Vascular/Lymphatic: Aortic atherosclerosis. No aneurysm. No upper abdominal or pelvic adenopathy. No inguinal adenopathy. Reproductive: Status post hysterectomy. No adnexal masses. Other: No free fluid or fluid collections. Musculoskeletal: No acute or significant osseous findings. Scoliosis and degenerative disc disease identified. IMPRESSION: 1. Examination is positive for small bowel obstruction. 2. Right inguinal hernia containing obstructed loop of small bowel represents the transition point for the obstruction. 3.  Aortic Atherosclerosis (ICD10-I70.0). 4. Bilateral renal cortical scarring. Electronically Signed   By: Signa Kellaylor  Stroud M.D.   On: 06/23/2017 19:43    ____________________________________________   PROCEDURES  Procedure(s) performed:    Procedures  None ____________________________________________   INITIAL IMPRESSION / ASSESSMENT AND PLAN / ED COURSE  Pertinent labs & imaging results that were available during my care of the patient were reviewed by me and considered in my medical decision making (see chart for details).  Patient presents to the emergency department for evaluation of right inguinal pain with likely hernia.  There are no overlying skin changes.  The patient tolerated gentle, constant pressure over the area but I was unable to reduce the mass.  Plan for CT imaging of the abdomen and pelvis.  Patient with some discomfort so given small dose of morphine pending scan. No anticoagulation.   CT imaging reviewed and discussed with patient. No nausea or vomiting at this time. Will discuss with general surgery.   Discussed patient's case with General Surgery, Dr.Cornette to request admission. Patient and family (if present) updated with plan. Care transferred to Surgery service.  I reviewed all  nursing notes, vitals, pertinent old records, EKGs, labs, imaging (as available).  ____________________________________________  FINAL CLINICAL IMPRESSION(S) / ED DIAGNOSES  Final diagnoses:  Non-recurrent unilateral inguinal hernia with obstruction without gangrene     MEDICATIONS GIVEN DURING THIS VISIT:  Medications  amLODipine (NORVASC) tablet 5 mg ( Oral Automatically Held 07/02/17 1000)  diltiazem (CARDIZEM CD) 24 hr capsule 120 mg ( Oral Automatically Held 07/02/17 1000)  furosemide (LASIX) tablet 40 mg ( Oral Automatically Held 07/02/17 1000)  nitroGLYCERIN (NITROSTAT) SL tablet 0.4 mg ( Sublingual MAR Hold 06/23/17 2205)  pantoprazole (PROTONIX) EC tablet 40 mg ( Oral Automatically Held 07/02/17 1000)  simvastatin (ZOCOR) tablet 10 mg ( Oral Automatically Held 07/02/17 1800)  morphine 4 MG/ML injection 2 mg (2 mg Intravenous Given 06/23/17 1845)  iopamidol (ISOVUE-300) 61 % injection (100 mLs  Contrast  Given 06/23/17 1914)    Note:  This document was prepared using Dragon voice recognition software and may include unintentional dictation errors.  Alona Bene, MD Emergency Medicine    Eraina Winnie, Arlyss Repress, MD 06/23/17 [phone]

## 2017-06-24 ENCOUNTER — Encounter (HOSPITAL_COMMUNITY): Payer: Self-pay | Admitting: Surgery

## 2017-06-24 ENCOUNTER — Other Ambulatory Visit: Payer: Self-pay

## 2017-06-24 LAB — COMPREHENSIVE METABOLIC PANEL
ALBUMIN: 3.4 g/dL — AB (ref 3.5–5.0)
ALK PHOS: 78 U/L (ref 38–126)
ALT: 20 U/L (ref 14–54)
AST: 56 U/L — AB (ref 15–41)
Anion gap: 14 (ref 5–15)
BILIRUBIN TOTAL: 0.7 mg/dL (ref 0.3–1.2)
BUN: 11 mg/dL (ref 6–20)
CO2: 20 mmol/L — ABNORMAL LOW (ref 22–32)
Calcium: 8.6 mg/dL — ABNORMAL LOW (ref 8.9–10.3)
Chloride: 98 mmol/L — ABNORMAL LOW (ref 101–111)
Creatinine, Ser: 1.27 mg/dL — ABNORMAL HIGH (ref 0.44–1.00)
GFR calc Af Amer: 42 mL/min — ABNORMAL LOW (ref >60)
GFR calc non Af Amer: 36 mL/min — ABNORMAL LOW (ref >60)
Glucose, Bld: 231 mg/dL — ABNORMAL HIGH (ref 65–99)
Potassium: 3.4 mmol/L — ABNORMAL LOW (ref 3.5–5.1)
SODIUM: 132 mmol/L — AB (ref 135–145)
Total Protein: 6 g/dL — ABNORMAL LOW (ref 6.5–8.1)

## 2017-06-24 LAB — CBC
HEMATOCRIT: 46 % (ref 36.0–46.0)
Hemoglobin: 15.5 g/dL — ABNORMAL HIGH (ref 12.0–15.0)
MCH: 29.5 pg (ref 26.0–34.0)
MCHC: 33.7 g/dL (ref 30.0–36.0)
MCV: 87.6 fL (ref 78.0–100.0)
Platelets: 356 10*3/uL (ref 150–400)
RBC: 5.25 MIL/uL — ABNORMAL HIGH (ref 3.87–5.11)
RDW: 14.1 % (ref 11.5–15.5)
WBC: 10.6 10*3/uL — AB (ref 4.0–10.5)

## 2017-06-24 MED ORDER — ONDANSETRON 4 MG PO TBDP: 4.0000 mg | ORAL_TABLET | Freq: Four times a day (QID) | ORAL | Status: DC | PRN

## 2017-06-24 MED ORDER — METHOCARBAMOL 500 MG PO TABS
500.0000 mg | ORAL_TABLET | Freq: Four times a day (QID) | ORAL | Status: DC | PRN
  Administered 2017-06-24 – 2017-06-25 (×3): 500 mg via ORAL
  Filled 2017-06-24 (×3): qty 1

## 2017-06-24 MED ORDER — ACETAMINOPHEN 325 MG PO TABS
650.0000 mg | ORAL_TABLET | Freq: Four times a day (QID) | ORAL | Status: DC | PRN
  Administered 2017-06-24 – 2017-06-25 (×4): 650 mg via ORAL
  Filled 2017-06-24 (×4): qty 2

## 2017-06-24 MED ORDER — MORPHINE SULFATE (PF) 2 MG/ML IV SOLN: 1.0000 mg | INTRAVENOUS | Status: DC | PRN

## 2017-06-24 MED ORDER — ENOXAPARIN SODIUM 30 MG/0.3ML ~~LOC~~ SOLN
30.0000 mg | SUBCUTANEOUS | Status: DC
  Administered 2017-06-24: 30 mg via SUBCUTANEOUS
  Filled 2017-06-24: qty 0.3

## 2017-06-24 MED ORDER — ENOXAPARIN SODIUM 40 MG/0.4ML ~~LOC~~ SOLN: 40.0000 mg | SUBCUTANEOUS | Status: DC

## 2017-06-24 MED ORDER — DEXTROSE-NACL 5-0.9 % IV SOLN
INTRAVENOUS | Status: DC
  Administered 2017-06-24 (×2): via INTRAVENOUS

## 2017-06-24 MED ORDER — ONDANSETRON HCL 4 MG/2ML IJ SOLN: 4.0000 mg | Freq: Four times a day (QID) | INTRAMUSCULAR | Status: DC | PRN

## 2017-06-24 MED ORDER — MORPHINE SULFATE (PF) 4 MG/ML IV SOLN: 1.0000 mg | INTRAVENOUS | Status: DC | PRN

## 2017-06-24 MED ORDER — HYDRALAZINE HCL 20 MG/ML IJ SOLN: 10.0000 mg | INTRAMUSCULAR | Status: DC | PRN

## 2017-06-24 MED ORDER — OXYCODONE HCL 5 MG PO TABS
5.0000 mg | ORAL_TABLET | ORAL | Status: DC | PRN
  Administered 2017-06-24: 5 mg via ORAL
  Filled 2017-06-24: qty 1

## 2017-06-24 NOTE — Progress Notes (Addendum)
Received patient from PACU accompanied by husband. Patient AOx4, ambulatory, VSS but elevated BP 153/72, pain at 2/10 and O2Sat=97% on RA.  Patient ambulated to the bathroom to void.  Now resting on bed comfortably trying to get some rest.  Will monitor.

## 2017-06-24 NOTE — Discharge Instructions (Signed)
[facility] _______[facility]  UMBILICAL OR INGUINAL/FEMORAL HERNIA REPAIR: POST OP INSTRUCTIONS  Always review your discharge instruction sheet given to you by the facility where your surgery was performed. IF YOU HAVE DISABILITY OR FAMILY LEAVE FORMS, YOU MUST BRING THEM TO THE OFFICE FOR PROCESSING.   DO NOT GIVE THEM TO YOUR DOCTOR.  1. A  prescription for pain medication may be given to you upon discharge.  Take your pain medication as prescribed, if needed.  If narcotic pain medicine is not needed, then you may take acetaminophen (Tylenol) or ibuprofen (Advil) as needed. 2. Take your usually prescribed medications unless otherwise directed. If you need a refill on your pain medication, please contact your pharmacy.  They will contact our office to request authorization. Prescriptions will not be filled after 5 pm or on week-ends. 3. You should follow a light diet the first 24 hours after arrival home, such as soup and crackers, etc.  Be sure to include lots of fluids daily.  Resume your normal diet the day after surgery. 4.Most patients will experience some swelling and bruising around the umbilicus or in the groin and scrotum.  Ice packs and reclining will help.  Swelling and bruising can take several days to resolve.  6. It is common to experience some constipation if taking pain medication after surgery.  Increasing fluid intake and taking a stool softener (such as Colace) will usually help or prevent this problem from occurring.  A mild laxative (Milk of Magnesia or Miralax) should be taken according to package directions if there are no bowel movements after 48 hours. 7. Unless discharge instructions indicate otherwise, you may remove your bandages 24-48 hours after surgery, and you may shower at that time.  You may have steri-strips (small skin tapes) in place directly over the incision.  These strips should be left on the skin for 7-10 days.  If your surgeon used skin glue on the  incision, you may shower in 24 hours.  The glue will flake off over the next 2-3 weeks.  Any sutures or staples will be removed at the office during your follow-up visit. 8. ACTIVITIES:  You may resume regular (light) daily activities beginning the next day--such as daily self-care, walking, climbing stairs--gradually increasing activities as tolerated.  You may have sexual intercourse when it is comfortable.  Refrain from any heavy lifting or straining until approved by your doctor.  a.You may drive when you are no longer taking prescription pain medication, you can comfortably wear a seatbelt, and you can safely maneuver your car and apply brakes.   9.You should see your doctor in the office for a follow-up appointment approximately 2-3 weeks after your surgery.  Make sure that you call for this appointment within a day or two after you arrive home to insure a convenient appointment time.  WHEN TO CALL YOUR DOCTOR: 1. Fever over 101.0 2. Inability to urinate 3. Nausea and/or vomiting 4. Extreme swelling or bruising 5. Continued bleeding from incision. 6. Increased pain, redness, or drainage from the incision  The clinic staff is available to answer your questions during regular business hours.  Please dont hesitate to call and ask to speak to one of the nurses for clinical concerns.  If you have a medical emergency, go to the nearest emergency room or call 911.  [facility] is always on call at the hospital   [address], [address], [place], [place]  [id number] ?  [address] [address], [place],    [address] °[phone] ? [phone] ? FAX [phone] °Web site: [email or web address] °

## 2017-06-24 NOTE — Progress Notes (Signed)
[facility] Progress Note  1 Day Post-Op  Subjective: CC: soreness in groin Patient with some mild pain in groin, well controlled with medications. Patient tolerating clears and passing some flatus. No n/v.  UOP good. VSS.   Objective: Vital signs in last 24 hours: Temp:  [98 F (36.7 C)-98.6 F (37 C)] 98.6 F (37 C) (01/15 1014) Pulse Rate:  [79-115] 85 (01/15 1014) Resp:  [17-24] 20 (01/15 1014) BP: (105-175)/(61-104) 105/61 (01/15 1014) SpO2:  [95 %-100 %] 98 % (01/15 1014) Weight:  [59.9 kg (132 lb)-61.2 kg (135 lb)] 59.9 kg (132 lb) (01/15 0111) Last BM Date: 06/23/17  Intake/Output from previous day: 01/14 0701 - 01/15 0700 In: 1471.7 [P.O.:120; I.V.:1351.7] Out: 725 [Urine:700; Blood:25] Intake/Output this shift: Total I/O In: 480 [P.O.:480] Out: -   PE: Gen:  Alert, NAD, pleasant Card:  Regular rate and rhythm, pedal pulses 2+ BL Pulm:  Normal effort, clear to auscultation bilaterally Abd: Soft, non-tender, non-distended, bowel sounds present, no HSM, incision C/D/I Skin: warm and dry, no rashes  Psych: A&Ox3   Lab Results:  Recent Labs    06/23/17 1550 06/24/17 0921  WBC 12.0* 10.6*  HGB 17.3* 15.5*  HCT 48.9* 46.0  PLT 351 356   BMET Recent Labs    06/23/17 1550  NA 131*  K 3.6  CL 96*  CO2 20*  GLUCOSE 107*  BUN 13  CREATININE 0.99  CALCIUM 9.6   PT/INR No results for input(s): LABPROT, INR in the last 72 hours. CMP     Component Value Date/Time   NA 131 (L) 06/23/2017 1550   NA 136 07/17/2016 1029   K 3.6 06/23/2017 1550   CL 96 (L) 06/23/2017 1550   CO2 20 (L) 06/23/2017 1550   GLUCOSE 107 (H) 06/23/2017 1550   BUN 13 06/23/2017 1550   BUN 14 07/17/2016 1029   CREATININE 0.99 06/23/2017 1550   CREATININE 1.11 (H) 02/14/2016 1234   CALCIUM 9.6 06/23/2017 1550   PROT 7.3 06/23/2017 1550   ALBUMIN 4.3 06/23/2017 1550   AST 30 06/23/2017 1550   ALT 20 06/23/2017 1550   ALKPHOS 90 06/23/2017 1550   BILITOT 1.0  06/23/2017 1550   GFRNONAA 49 (L) 06/23/2017 1550   GFRAA 57 (L) 06/23/2017 1550   Lipase     Component Value Date/Time   LIPASE 38 06/23/2017 1550       Studies/Results: Ct Abdomen Pelvis W Contrast  Result Date: 06/23/2017 CLINICAL DATA:  Nausea and vomiting. EXAM: CT ABDOMEN AND PELVIS WITH CONTRAST TECHNIQUE: Multidetector CT imaging of the abdomen and pelvis was performed using the standard protocol following bolus administration of intravenous contrast. CONTRAST:  100mL ISOVUE-300 IOPAMIDOL (ISOVUE-300) INJECTION 61% COMPARISON:  100 cc of Isovue-300 FINDINGS: Lower chest: No acute abnormality. Hepatobiliary: No focal liver abnormality is seen. No gallstones, gallbladder wall thickening, or biliary dilatation. Pancreas: Unremarkable. No pancreatic ductal dilatation or surrounding inflammatory changes. Spleen: Normal in size without focal abnormality. Adrenals/Urinary Tract: Normal adrenal glands. Bilateral renal cortical scarring identified. There are several right kidney cysts. No hydronephrosis or mass identified. Urinary bladder appears unremarkable. Stomach/Bowel: The stomach is normal. There is pathologic dilatation of the proximal and mid small bowel loops. Transition point to decreased caliber small bowel loops is within the right inguinal hernia. Unremarkable appearance of the:. Vascular/Lymphatic: Aortic atherosclerosis. No aneurysm. No upper abdominal or pelvic adenopathy. No inguinal adenopathy. Reproductive: Status post hysterectomy. No adnexal masses. Other: No free fluid or fluid collections. Musculoskeletal: No acute  or significant osseous findings. Scoliosis and degenerative disc disease identified. IMPRESSION: 1. Examination is positive for small bowel obstruction. 2. Right inguinal hernia containing obstructed loop of small bowel represents the transition point for the obstruction. 3.  Aortic Atherosclerosis (ICD10-I70.0). 4. Bilateral renal cortical scarring. Electronically  Signed   By: Signa Kell M.D.   On: 06/23/2017 19:43    Anti-infectives: Anti-infectives (From admission, onward)   None       Assessment/Plan HTN GERD COPD  Incarcerated R femoral hernia S/P R femoral hernia repair with mesh - 06/23/17 Dr. Luisa Hart  - POD#1 - advance diet as tolerated - mobilize with PT/OT - likely home tomorrow if tolerating diet   FEN: FLD and ADAT VTE: SCDs, lovneox ID: no [id number]   LOS: 1 day    Wells Guiles , [facility] 06/24/2017, 10:20 AM Pager: [phone] Consults: [phone] Mon-Fri 7:00 am-4:30 pm Sat-Sun 7:00 am-11:30 am

## 2017-06-24 NOTE — Evaluation (Signed)
Physical Therapy Evaluation Patient Details Name: Alexandra Reyes MRN: [id number] DOB: 21-Jan-1926 Today's Date: 06/24/2017   History of Present Illness  Pt is an 84 y/o female admitted secondary to R lower abdominal pain. Impagin revealed a femoral hernia on the R which was causing bowel obstruction. Pt is s/p hernia repair on 1/14. PMH includes CAD, HTN, macular degeneration, COPD, MI, aosteoporosis, s/p SVT ablation.   Clinical Impression  Pt admitted secondary to problem above with deficits below. PTA, pt was independent with functional mobility. Upon eval, pt presenting with unsteadiness requiring min to min guard assist for mobility. Reports increased stability with use of IV pole; educated about use of cane at home to increase stability and pt agreeable. Feels once she is less groggy she will be stable and will not need follow up PT. Will continue to follow acutely to maximize functional mobility and independence.     Follow Up Recommendations No PT follow up;Supervision - Intermittent(Pt refusing follow up PT )    Equipment Recommendations  3in1 (PT);Cane    Recommendations for Other Services       Precautions / Restrictions Precautions Precautions: Fall Restrictions Weight Bearing Restrictions: No      Mobility  Bed Mobility Overal bed mobility: Modified Independent             General bed mobility comments: Increased time, however, no assist required. Educated that if abdomen becomes painful to use log roll technique.   Transfers Overall transfer level: Needs assistance Equipment used: None Transfers: Sit to/from Stand Sit to Stand: Min guard;Min assist         General transfer comment: Slight loss of balance noted upon standing, requiring min A for steadying. Otherwise required min guard for safety. Reports she feels groggy from surgery and not getting any sleep.   Ambulation/Gait Ambulation/Gait assistance: Min guard Ambulation Distance (Feet): 200  Feet Assistive device: (IV pole ) Gait Pattern/deviations: Step-through pattern;Decreased stride length Gait velocity: Decreased  Gait velocity interpretation: Below normal speed for age/gender General Gait Details: Slow, slightly unsteady gait, however, no LOB noted. Reports she feels more steady with use of IV pole. Educated about using cane to increased stability at home and pt agreeable.   Stairs            Wheelchair Mobility    Modified Rankin (Stroke Patients Only)       Balance Overall balance assessment: Needs assistance Sitting-balance support: No upper extremity supported;Feet supported Sitting balance-Leahy Scale: Good     Standing balance support: Single extremity supported;No upper extremity supported;During functional activity Standing balance-Leahy Scale: Fair                               Pertinent Vitals/Pain Pain Assessment: 0-10 Pain Score: 8  Pain Location: R groin with certain movements, otherwise no pain Pain Descriptors / Indicators: Sharp Pain Intervention(s): Limited activity within patient's tolerance;Monitored during session;Repositioned    Home Living Family/patient expects to be discharged to:: Private residence Living Arrangements: Alone Available Help at Discharge: Family;Available PRN/intermittently Type of Home: House Home Access: Stairs to enter Entrance Stairs-Rails: Doctor, general practice of Steps: 5 Home Layout: One level Home Equipment: Grab bars - tub/shower      Prior Function Level of Independence: Independent         Comments: Pt reports she was very active. Volunteered at hospice and still drives.      Hand Dominance   Dominant Hand:  Right    Extremity/Trunk Assessment   Upper Extremity Assessment Upper Extremity Assessment: Defer to OT evaluation    Lower Extremity Assessment Lower Extremity Assessment: Overall WFL for tasks assessed    Cervical / Trunk Assessment Cervical /  Trunk Assessment: Normal  Communication   Communication: No difficulties  Cognition Arousal/Alertness: Awake/alert Behavior During Therapy: WFL for tasks assessed/performed Overall Cognitive Status: Within Functional Limits for tasks assessed                                        General Comments General comments (skin integrity, edema, etc.): Pt's son present during session. Educated about follow up PT, however, pt refusing at this time. Reports she feels like she will be close to normal once she is less groggy.     Exercises     Assessment/Plan    PT Assessment Patient needs continued PT services  PT Problem List Decreased balance;Decreased mobility;Decreased knowledge of use of DME;Decreased knowledge of precautions;Pain       PT Treatment Interventions DME instruction;Stair training;Gait training;Therapeutic activities;Functional mobility training;Therapeutic exercise;Balance training;Neuromuscular re-education;Patient/family education    PT Goals (Current goals can be found in the Care Plan section)  Acute Rehab PT Goals Patient Stated Goal: to go home  PT Goal Formulation: With patient Time For Goal Achievement: 07/01/17 Potential to Achieve Goals: Good    Frequency Min 3X/week   Barriers to discharge Decreased caregiver support      Co-evaluation               AM-PAC PT "6 Clicks" Daily Activity  Outcome Measure Difficulty turning over in bed (including adjusting bedclothes, sheets and blankets)?: None Difficulty moving from lying on back to sitting on the side of the bed? : A Little Difficulty sitting down on and standing up from a chair with arms (e.g., wheelchair, bedside commode, etc,.)?: Unable Help needed moving to and from a bed to chair (including a wheelchair)?: A Little Help needed walking in hospital room?: A Little Help needed climbing 3-5 steps with a railing? : A Little 6 Click Score: 17    End of Session Equipment Utilized  During Treatment: Gait belt Activity Tolerance: Patient tolerated treatment well Patient left: in bed;with call bell/phone within reach;with family/visitor present Nurse Communication: Mobility status PT Visit Diagnosis: Unsteadiness on feet (R26.81);Pain Pain - part of body: (abdomen )    Time: 6962-95281255-1318 PT Time Calculation (min) (ACUTE ONLY): 23 min   Charges:   PT Evaluation $PT Eval Low Complexity: 1 Low PT Treatments $Gait Training: 8-22 mins   PT G Codes:        Gladys DammeBrittany Aribelle Mccosh, PT, DPT  Acute Rehabilitation Services  Pager: [phone]   Lehman PromBrittany S Benjamin Casanas 06/24/2017, 2:23 PM

## 2017-06-25 ENCOUNTER — Ambulatory Visit: Payer: Medicare Other | Admitting: Pulmonary Disease

## 2017-06-25 LAB — COMPREHENSIVE METABOLIC PANEL
ALT: 24 U/L (ref 14–54)
AST: 49 U/L — ABNORMAL HIGH (ref 15–41)
Albumin: 3.1 g/dL — ABNORMAL LOW (ref 3.5–5.0)
Alkaline Phosphatase: 64 U/L (ref 38–126)
Anion gap: 9 (ref 5–15)
BILIRUBIN TOTAL: 0.6 mg/dL (ref 0.3–1.2)
BUN: 14 mg/dL (ref 6–20)
CO2: 23 mmol/L (ref 22–32)
CREATININE: 0.98 mg/dL (ref 0.44–1.00)
Calcium: 8.3 mg/dL — ABNORMAL LOW (ref 8.9–10.3)
Chloride: 103 mmol/L (ref 101–111)
GFR, EST AFRICAN AMERICAN: 58 mL/min — AB (ref >60)
GFR, EST NON AFRICAN AMERICAN: 50 mL/min — AB (ref >60)
Glucose, Bld: 106 mg/dL — ABNORMAL HIGH (ref 65–99)
POTASSIUM: 3.5 mmol/L (ref 3.5–5.1)
Sodium: 135 mmol/L (ref 135–145)
TOTAL PROTEIN: 5.6 g/dL — AB (ref 6.5–8.1)

## 2017-06-25 LAB — CBC
HCT: 42.1 % (ref 36.0–46.0)
Hemoglobin: 14.3 g/dL (ref 12.0–15.0)
MCH: 30 pg (ref 26.0–34.0)
MCHC: 34 g/dL (ref 30.0–36.0)
MCV: 88.3 fL (ref 78.0–100.0)
PLATELETS: 337 10*3/uL (ref 150–400)
RBC: 4.77 MIL/uL (ref 3.87–5.11)
RDW: 14.3 % (ref 11.5–15.5)
WBC: 14.6 10*3/uL — AB (ref 4.0–10.5)

## 2017-06-25 MED ORDER — METHOCARBAMOL 500 MG PO TABS: 500.0000 mg | ORAL_TABLET | Freq: Four times a day (QID) | ORAL | 0 refills | Status: DC | PRN

## 2017-06-25 MED ORDER — TRAMADOL HCL 50 MG PO TABS: 50.0000 mg | ORAL_TABLET | Freq: Four times a day (QID) | ORAL | Status: DC | PRN

## 2017-06-25 NOTE — Discharge Summary (Signed)
[facility] Discharge Summary   Patient ID: Alexandra Reyes MRN: [id number] DOB/AGE: August 25, 1925 84 y.o.  Admit date: 06/23/2017 Discharge date: 06/25/2017  Admitting Diagnosis: Right femoral hernia, incarcerated   Discharge Diagnosis Right femoral hernia, incarcerated - s/p hernia repair   Consultants None   Imaging: Ct Abdomen Pelvis W Contrast  Result Date: 06/23/2017 CLINICAL DATA:  Nausea and vomiting. EXAM: CT ABDOMEN AND PELVIS WITH CONTRAST TECHNIQUE: Multidetector CT imaging of the abdomen and pelvis was performed using the standard protocol following bolus administration of intravenous contrast. CONTRAST:  100mL ISOVUE-300 IOPAMIDOL (ISOVUE-300) INJECTION 61% COMPARISON:  100 cc of Isovue-300 FINDINGS: Lower chest: No acute abnormality. Hepatobiliary: No focal liver abnormality is seen. No gallstones, gallbladder wall thickening, or biliary dilatation. Pancreas: Unremarkable. No pancreatic ductal dilatation or surrounding inflammatory changes. Spleen: Normal in size without focal abnormality. Adrenals/Urinary Tract: Normal adrenal glands. Bilateral renal cortical scarring identified. There are several right kidney cysts. No hydronephrosis or mass identified. Urinary bladder appears unremarkable. Stomach/Bowel: The stomach is normal. There is pathologic dilatation of the proximal and mid small bowel loops. Transition point to decreased caliber small bowel loops is within the right inguinal hernia. Unremarkable appearance of the:. Vascular/Lymphatic: Aortic atherosclerosis. No aneurysm. No upper abdominal or pelvic adenopathy. No inguinal adenopathy. Reproductive: Status post hysterectomy. No adnexal masses. Other: No free fluid or fluid collections. Musculoskeletal: No acute or significant osseous findings. Scoliosis and degenerative disc disease identified. IMPRESSION: 1. Examination is positive for small bowel obstruction. 2. Right inguinal hernia containing obstructed loop  of small bowel represents the transition point for the obstruction. 3.  Aortic Atherosclerosis (ICD10-I70.0). 4. Bilateral renal cortical scarring. Electronically Signed   By: Signa Kellaylor  Stroud M.D.   On: 06/23/2017 19:43    Procedures Dr. Luisa Hartornett (06/23/17) - Repair of right femoral hernia with mesh   Hospital Course:  Patient is a 84 y/o female [facility] and nausea and vomiting.  Workup showed incarcerated right femoral hernia.  Patient was admitted and underwent procedure listed above.  Tolerated procedure well and was transferred to the floor.  Diet was advanced as tolerated.  On POD#2, the patient was voiding well, tolerating diet, ambulating well, pain well controlled, vital signs stable, incisions c/d/i and felt stable for discharge home.  Patient will follow up in our office in 2 weeks and knows to call with questions or concerns. She will call to confirm appointment date/time.    Physical Exam: General:  Alert, NAD, pleasant, comfortable Abd:  Soft, ND, non-tender, incisions C/D/I with moderate ecchymosis and mild edema, no surrounding erythema or drainage   Allergies as of 06/25/2017      Reactions   Symbicort [budesonide-formoterol Fumarate]    Jittery in high doses, this has been reduced and is tolerated at this time.       Medication List    STOP taking these medications   diltiazem 120 MG 24 hr capsule Commonly known as:  CARDIZEM CD     TAKE these medications   acetaminophen 500 MG tablet Commonly known as:  TYLENOL Take 1,000 mg by mouth 2 (two) times daily as needed for moderate pain or headache.   ALPRAZolam 0.25 MG tablet Commonly known as:  XANAX TAKE ONE TABLET BY MOUTH AT BEDTIME AS NEEDED FOR ANXIETY   amLODipine 5 MG tablet Commonly known as:  NORVASC Take 1 tablet (5 mg total) by mouth daily.   aspirin 81 MG tablet Take 81 mg by mouth at  bedtime.   calcium carbonate 500 MG chewable tablet Commonly known as:  TUMS - dosed  in mg elemental calcium Chew 3 tablets by mouth daily as needed for indigestion or heartburn.   calcium carbonate 600 MG tablet Commonly known as:  OS-CAL Take 600 mg by mouth daily.   diltiazem 30 MG tablet Commonly known as:  CARDIZEM Take 1 tablet (30 mg total) by mouth 4 (four) times daily as needed.   docusate sodium 100 MG capsule Commonly known as:  COLACE Take 100 mg by mouth daily.   ferrous sulfate 325 (65 FE) MG tablet Take 325 mg by mouth daily.   furosemide 40 MG tablet Commonly known as:  LASIX TAKE ONE TABLET BY MOUTH DAILY   GLUCOSAMINE-MSM PO Take 2 tablets by mouth daily.   methocarbamol 500 MG tablet Commonly known as:  ROBAXIN Take 1 tablet (500 mg total) by mouth every 6 (six) hours as needed for muscle spasms.   nitroGLYCERIN 0.4 MG SL tablet Commonly known as:  NITROSTAT Place 1 tablet (0.4 mg total) under the tongue every 5 (five) minutes as needed for chest pain.   omeprazole 40 MG capsule Commonly known as:  PRILOSEC TAKE ONE CAPSULE BY MOUTH DAILY   PRESCRIPTION MEDICATION Apply 1 Dose to eye every 3 (three) months. Eylea - eye injection   simvastatin 10 MG tablet Commonly known as:  ZOCOR TAKE ONE TABLET BY MOUTH EVERY EVENING AT 6:00 PM   SYMBICORT 80-4.5 MCG/ACT inhaler Generic drug:  budesonide-formoterol INHALE 1 PUFF INTO THE LUNGS TWICE A DAY AS DIRECTED   tobramycin 0.3 % ophthalmic solution Commonly known as:  TOBREX Place 1 drop into the right eye 4 (four) times daily. Use the day before, the day of, and the day after eye injections   traMADol 50 MG tablet Commonly known as:  ULTRAM Take 1 tablet (50 mg total) by mouth every 6 (six) hours as needed for moderate pain.   triamcinolone cream 0.1 % Commonly known as:  KENALOG Apply 1 application topically daily as needed (hives). Apply to affected area   VITAMIN B COMPLEX PO Take 1 tablet by mouth daily.   VITAMIN C PO Take 1 tablet by mouth daily. Reported on  08/08/2015   vitamin E 200 UNIT capsule Take 200 Units by mouth daily.            Durable Medical Equipment  (From admission, onward)        Start     Ordered   06/25/17 1025  For home use only DME 3 n 1  Once     06/25/17 1024   06/25/17 1025  For home use only DME Cane  Once     06/25/17 1024       Follow-up Information    Cornett, Maisie Fus, MD. Call.   Specialty:  General Surgery Why:  Call to set up a follow up appointment for 2 weeks. Bring photo ID and insurance information. Please arrive 30 min prior to appointment time.  Contact information: [address] [place] [address] [phone]           Signed: Wells Guiles, [facility] 06/25/2017, 12:18 PM Pager: [phone] Consults: [phone] Mon-Fri 7:00 am-4:30 pm Sat-Sun 7:00 am-11:30 am

## 2017-06-25 NOTE — Progress Notes (Signed)
Alexandra Reyes to be D/C'd  per MD order. Discussed with the patient and all questions fully answered.  VSS, Skin clean, dry and intact without evidence of skin break down, no evidence of skin tears noted.  IV catheter discontinued intact. Site without signs and symptoms of complications. Dressing and pressure applied.  An After Visit Summary was printed and given to the patient. Patient received prescription.  D/c education completed with patient/family including follow up instructions, medication list, d/c activities limitations if indicated, with other d/c instructions as indicated by MD - patient able to verbalize understanding, all questions fully answered.   Patient instructed to return to ED, call 911, or call MD for any changes in condition.   Patient to be escorted via WC, and D/C home via private auto.

## 2017-06-25 NOTE — Anesthesia Postprocedure Evaluation (Signed)
Anesthesia Post Note  Patient: Alexandra Reyes  Procedure(s) Performed: REPAIR INCARCERATED  RIGHT FEMORAL HERNIA WITH MESH (Right Groin)     Patient location during evaluation: PACU Anesthesia Type: General Level of consciousness: sedated, patient cooperative and oriented Pain management: pain level controlled Vital Signs Assessment: post-procedure vital signs reviewed and stable Respiratory status: spontaneous breathing, nonlabored ventilation, respiratory function stable and patient connected to nasal cannula oxygen Cardiovascular status: blood pressure returned to baseline and stable Postop Assessment: no apparent nausea or vomiting Anesthetic complications: no    Last Vitals:  Vitals:   06/25/17 0604 06/25/17 0947  BP: (!) 115/58 124/66  Pulse: 81 90  Resp: 16 17  Temp: 36.6 C 36.6 C  SpO2: 94% 99%    Last Pain:  Vitals:   06/25/17 1129  TempSrc:   PainSc: 3                  Natallia Stellmach,E. Audrie Kuri

## 2017-06-25 NOTE — Care Management Note (Signed)
Case Management Note  Patient Details  Name: Alexandra Reyes MRN: [id number] Date of Birth: 1925/12/05  Subjective/Objective:                    Action/Plan:   Expected Discharge Date:                  Expected Discharge Plan:  Home/Self Care  In-House Referral:     Discharge planning Services  CM Consult  Post Acute Care Choice:  Durable Medical Equipment Choice offered to:     DME Arranged:  3-N-1, Gilmer Morane DME Agency:  [facility]  HH Arranged:  NA HH Agency:  NA  Status of Service:  Completed, signed off  If discussed at Long Length of Stay Meetings, dates discussed:    Additional Comments:  [name], [name], RN 06/25/2017, 11:05 AM

## 2017-06-25 NOTE — Consult Note (Signed)
[facility] CM Primary Care Navigator  06/25/2017  Alexandra Reyes 05-31-1926 [id number]    Wentto see patient and met with son Alexandra Reyes) at the [place] identify possible discharge needs. Patientreports having "right groin pain and nausea/ vomiting" which ledto this admission/ surgery (repair of right femoral hernia with mesh).  Patientendorses Dr. Cathlean Cower with [place] care provider.   [facility] [place] [facility] without difficulty so far.   Patient reportsthatshehas beenmanagingherownmedications at [facility] of "pill [facility]" system filled once a week.  Patient statesthat she was driving prior to admission/ surgery. Her son will be able to providetransportation to herdoctors'appointments when needed.  Patient verbalized thatshe lives alone and independent with self care. She states that she will be the primary caregiver for herself and son (living close by) will assist her with care needs at home if needed.   Anticipateddischarge planis home according to patient.  Patientencouraged to call primary care provider's office when shereturnshome,for a post discharge follow-up within1-2weeksor sooner if needs arise.Patient letter (with PCP's contact number) was provided asa reminder.   Discussed with patient and sonregarding [facility] CM services available for health management and resources at home butpatient reports being "able to manage".  She states that COPD has not been causing any problem and able to manage it with breathing treatment/ inhaler and follow-up with provider when needed.  She mentioned having to follow-up with pulmonologist after she recovers.   Patienthad only opted and verbally agreed forEMMI calls to follow-up withherrecovery after offering [facility] services. Referral made for [facility] calls after discharge.  She voiced  understandingto seekreferral from primary care provider to [facility] care management if deemednecessaryand appropriate for any services in the future.  [facility] care management information provided for future needs that she may have.   For questions, please contact:  Dannielle Huh, BSN, RN- [facility] Primary Care Navigator  Telephone: [phone] [place]

## 2017-06-26 ENCOUNTER — Telehealth: Payer: Self-pay | Admitting: *Deleted

## 2017-06-26 NOTE — Telephone Encounter (Signed)
Pt was on TCM list admitted 06/23/17 for Right femoral hernia, incarcerated. Pt had hernia repair  procedure well. Pt D/c 06/25/17, and will f/u w/specialist Dr. Luisa Hartornett in 2 weeks...Raechel Chute/lmb

## 2017-06-27 ENCOUNTER — Telehealth: Payer: Self-pay | Admitting: Surgery

## 2017-06-27 NOTE — Telephone Encounter (Signed)
Alexandra Reyes  [birth year] [id number]  Patient Care Team: Corwin Levins, MD as PCP - General (Internal Medicine) Cassell Clement, MD as Consulting Physician (Cardiology) Roslynn Amble, MD as Consulting Physician (Pulmonary Disease) Hillis Range, MD as Consulting Physician (Cardiology) Kathleene Hazel, MD as Consulting Physician (Cardiology)  This patient is a 84 y.o.female who calls today for surgical evaluation.   Date of procedure/visit: 06/23/2017  Surgery: Emergent reduction and repair of incarcerated right femoral hernia containing inflamed small intestine  Reason for call: Intermittent blood in drainage from incision.  Patient calls Friday evening with intermittent bloody drainage from the incision when she bends over or stoops.  Had some moderate swelling and bruising.  Since the drainage she feels a little bit better.  It is not severe.  Just concerned her.  No nausea or vomiting.  She is eating well.  She remains on low-dose aspirin.  He has moved her bowels but not back to normal yet.  She is on MiraLAX.  And noted some postoperative swelling as expected in emergent situation with someone on blood thinners.  Does not sound particularly severe.  Keep the area clean and dry and covered.  It should gradually slow down and improve.  Keep the appointment to see [place] in 10 days.  If worsens, let us know.  She expressed understanding and appreciation  Patient Active Problem List   Diagnosis Date Noted  . Femoral hernia of right side with obstruction 06/23/2017  . CKD (chronic kidney disease), stage III (HCC) 03/12/2017  . Weight loss 11/27/2016  . SVT (supraventricular tachycardia) (HCC) 09/19/2016  . Left-sided nosebleed 09/11/2016  . Coronary artery disease involving native coronary artery of native heart without angina pectoris   . ILD (interstitial lung disease) (HCC) 05/22/2016  . Constipation 03/13/2016  . Bronchiectasis without acute exacerbation (HCC)  03/13/2016  . Chest pain 08/30/2015  . Dyspnea 08/02/2015  . Bilateral leg pain 02/01/2015  . Diastolic dysfunction 02/01/2015  . Peripheral edema 02/01/2015  . Abnormal EKG 05/18/2014  . Syncope 05/18/2014  . Dyslipidemia 05/14/2013  . Dyspnea on exertion 05/14/2013  . DOE (dyspnea on exertion) 04/28/2013  . Personal history of colonic polyps 10/28/2012  . Osteopenia 04/29/2012  . Elevated digoxin level 04/29/2012  . Allergic rhinitis, cause unspecified 05/22/2011  . Alcohol dependence in remission (HCC) 05/22/2011  . Depression 05/22/2011  . Preventative health care 05/18/2011  . HTN (hypertension) 05/18/2011  . Hyperlipidemia 05/18/2011  . Anemia, iron deficiency 05/18/2011  . GERD (gastroesophageal reflux disease) 05/18/2011  . DDD (degenerative disc disease), lumbar 05/18/2011  . Pectus excavatum 05/18/2011  . Macular degeneration   . OA (osteoarthritis)   . Cholelithiasis   . COPD (chronic obstructive pulmonary disease) (HCC)   . Paroxysmal SVT (supraventricular tachycardia) (HCC) 02/07/2011    Past Medical History:  Diagnosis Date  . Age-related macular degeneration, wet, both eyes (HCC)   . Alcohol dependence in remission (HCC) 05/22/2011  . Allergic rhinitis, cause unspecified 05/22/2011  . Allergy   . Anemia, iron deficiency 05/18/2011  . Cholelithiasis   . COPD (chronic obstructive pulmonary disease) (HCC)   . DDD (degenerative disc disease), lumbar 05/18/2011  . Depression    "@ times" (09/19/2016)  . First degree AV block   . GERD (gastroesophageal reflux disease)   . Hiatal hernia   . History of blood transfusion    "3 or 4 related to childbirth"  . HTN (hypertension) 05/18/2011   pt denies this hx on 09/19/2016  .  Hyperlipidemia 05/18/2011  . Macular degeneration   . Myocardial infarction (HCC)    "EKG shows I've had 2; date unknown" (09/19/2016)  . OA (osteoarthritis)    "a little here and there; mainly in the back" (09/19/2016)  . Osteoporosis  05/18/2011  . PAT (paroxysmal atrial tachycardia) (HCC)   . Pectus excavatum 05/18/2011  . Personal history of colonic polyps 10/28/2012  . Pneumonia    "as a child"  . SVT (supraventricular tachycardia) (HCC)   . Urinary frequency     Past Surgical History:  Procedure Laterality Date  . ABDOMINAL HYSTERECTOMY  1988  . BREAST BIOPSY Right 1948   "it was ok"  . CARDIAC CATHETERIZATION    . CARDIOVASCULAR STRESS TEST  03/08/2009   EF 84%  . CATARACT EXTRACTION W/ INTRAOCULAR LENS  IMPLANT, BILATERAL Bilateral   . INGUINAL HERNIA REPAIR Right 06/23/2017   Procedure: REPAIR INCARCERATED  RIGHT FEMORAL HERNIA WITH MESH;  Surgeon: Harriette Bouillon, MD;  Location: [facility] OR;  Service: General;  Laterality: Right;  . KNEE ARTHROSCOPY Right   . RIGHT/LEFT HEART CATH AND CORONARY ANGIOGRAPHY N/A 07/25/2016   Procedure: Right/Left Heart Cath and Coronary Angiography;  Surgeon: Kathleene Hazel, MD;  [facility]: [facility] INVASIVE CV LAB;  Service: Cardiovascular;  Laterality: N/A;  . SVT ABLATION  09/19/2016  . SVT ABLATION N/A 09/19/2016   Procedure: SVT Ablation;  Surgeon: Hillis Range, MD;  Location[facility] INVASIVE CV LAB;  Service: Cardiovascular;  Laterality: N/A;  . TONSILLECTOMY AND ADENOIDECTOMY    . US ECHOCARDIOGRAPHY  01/17/2003   EF 55-60%    Social History   Socioeconomic History  . Marital status: Widowed    Spouse name: Not on file  . Number of children: Y  . Years of education: 74  . Highest education level: Not on file  Social Needs  . Financial resource strain: Not on file  . Food insecurity - worry: Not on file  . Food insecurity - inability: Not on file  . Transportation needs - medical: Not on file  . Transportation needs - non-medical: Not on file  Occupational History  . Occupation: Retired Designer, jewellery  Tobacco Use  . Smoking status: Former Smoker    Packs/day: 1.50    Years: 36.00    Pack years: 54.00    Types: Cigarettes    Start date: 03/01/1959    Last attempt  to quit: 06/10/1994    Years since quitting: 23.0  . Smokeless tobacco: Never Used  . Tobacco comment: smoked 1- 1.5 ppd.   Substance and Sexual Activity  . Alcohol use: Yes    Alcohol/week: 0.0 oz    Comment: 09/19/2016 "quit drinking in ~ 1978"  . Drug use: No  . Sexual activity: Not on file  Other Topics Concern  . Not on file  Social History Narrative   Originally from [place], [place]. She moved to [place] in 1950 during the polio epidemic. 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 Remote bird exposure. No mold or hot tub exposure.     Family History  Problem Relation Age of Onset  . Heart failure Mother   . Arthritis Mother   . Kidney failure Father   . Heart disease Father   . Hypertension Father     Current Outpatient Medications  Medication Sig Dispense Refill  . acetaminophen (TYLENOL) 500 MG tablet Take 1,000 mg by mouth 2 (two) times daily as needed for moderate pain or headache.    . ALPRAZolam Prudy Feeler)  0.25 MG tablet TAKE ONE TABLET BY MOUTH AT BEDTIME AS NEEDED FOR ANXIETY 90 tablet 1  . amLODipine (NORVASC) 5 MG tablet Take 1 tablet (5 mg total) by mouth daily. 90 tablet 3  . Ascorbic Acid (VITAMIN C PO) Take 1 tablet by mouth daily. Reported on 08/08/2015    . aspirin 81 MG tablet Take 81 mg by mouth at bedtime.     . B Complex Vitamins (VITAMIN B COMPLEX PO) Take 1 tablet by mouth daily.     . calcium carbonate (OS-CAL) 600 MG tablet Take 600 mg by mouth daily.    . calcium carbonate (TUMS - DOSED IN MG ELEMENTAL CALCIUM) 500 MG chewable tablet Chew 3 tablets by mouth daily as needed for indigestion or heartburn.    . diltiazem (CARDIZEM) 30 MG tablet Take 1 tablet (30 mg total) by mouth 4 (four) times daily as needed. 120 tablet 11  . docusate sodium (COLACE) 100 MG capsule Take 100 mg by mouth daily.     . ferrous sulfate 325 (65 FE) MG tablet Take 325 mg by mouth daily.     . furosemide (LASIX) 40 MG tablet TAKE ONE TABLET BY MOUTH DAILY 90 tablet 3  .  Glucosamine HCl-MSM (GLUCOSAMINE-MSM PO) Take 2 tablets by mouth daily.     . methocarbamol (ROBAXIN) 500 MG tablet Take 1 tablet (500 mg total) by mouth every 6 (six) hours as needed for muscle spasms. 30 tablet 0  . nitroGLYCERIN (NITROSTAT) 0.4 MG SL tablet Place 1 tablet (0.4 mg total) under the tongue every 5 (five) minutes as needed for chest pain. 25 tablet 3  . omeprazole (PRILOSEC) 40 MG capsule TAKE ONE CAPSULE BY MOUTH DAILY 30 capsule 3  . PRESCRIPTION MEDICATION Apply 1 Dose to eye every 3 (three) months. Eylea - eye injection    . simvastatin (ZOCOR) 10 MG tablet TAKE ONE TABLET BY MOUTH EVERY EVENING AT 6:00 PM 90 tablet 2  . SYMBICORT 80-4.5 MCG/ACT inhaler INHALE 1 PUFF INTO THE LUNGS TWICE A DAY AS DIRECTED 1 Inhaler 5  . tobramycin (TOBREX) 0.3 % ophthalmic solution Place 1 drop into the right eye 4 (four) times daily. Use the day before, the day of, and the day after eye injections    . traMADol (ULTRAM) 50 MG tablet Take 1 tablet (50 mg total) by mouth every 6 (six) hours as needed for moderate pain. 90 tablet 0  . triamcinolone cream (KENALOG) 0.1 % Apply 1 application topically daily as needed (hives). Apply to affected area     . vitamin E 200 UNIT capsule Take 200 Units by mouth daily.     No current facility-administered medications for this visit.      Allergies  Allergen Reactions  . Symbicort [Budesonide-Formoterol Fumarate]     Jittery in high doses, this has been reduced and is tolerated at this time.     @VS @  Ct Abdomen Pelvis W Contrast  Result Date: 06/23/2017 CLINICAL DATA:  Nausea and vomiting. EXAM: CT ABDOMEN AND PELVIS WITH CONTRAST TECHNIQUE: Multidetector CT imaging of the abdomen and pelvis was performed using the standard protocol following bolus administration of intravenous contrast. CONTRAST:  100mL ISOVUE-300 IOPAMIDOL (ISOVUE-300) INJECTION 61% COMPARISON:  100 cc of Isovue-300 FINDINGS: Lower chest: No acute abnormality. Hepatobiliary: No  focal liver abnormality is seen. No gallstones, gallbladder wall thickening, or biliary dilatation. Pancreas: Unremarkable. No pancreatic ductal dilatation or surrounding inflammatory changes. Spleen: Normal in size without focal abnormality. Adrenals/Urinary Tract: Normal adrenal  glands. Bilateral renal cortical scarring identified. There are several right kidney cysts. No hydronephrosis or mass identified. Urinary bladder appears unremarkable. Stomach/Bowel: The stomach is normal. There is pathologic dilatation of the proximal and mid small bowel loops. Transition point to decreased caliber small bowel loops is within the right inguinal hernia. Unremarkable appearance of the:. Vascular/Lymphatic: Aortic atherosclerosis. No aneurysm. No upper abdominal or pelvic adenopathy. No inguinal adenopathy. Reproductive: Status post hysterectomy. No adnexal masses. Other: No free fluid or fluid collections. Musculoskeletal: No acute or significant osseous findings. Scoliosis and degenerative disc disease identified. IMPRESSION: 1. Examination is positive for small bowel obstruction. 2. Right inguinal hernia containing obstructed loop of small bowel represents the transition point for the obstruction. 3.  Aortic Atherosclerosis (ICD10-I70.0). 4. Bilateral renal cortical scarring. Electronically Signed   By: Signa Kell M.D.   On: 06/23/2017 19:43    Note: This dictation was prepared with Dragon/digital dictation along with [employer]. Any transcriptional errors that result from this process are unintentional.   .Ardeth Sportsman, M.D., F.A.C.S. Gastrointestinal and Minimally Invasive Surgery [facility] [address], [address] [place], [place] [address] [phone] Main / Paging  06/27/2017 6:06 PM

## 2017-07-02 DIAGNOSIS — N816 Rectocele: Secondary | ICD-10-CM | POA: Diagnosis not present

## 2017-07-04 ENCOUNTER — Ambulatory Visit (INDEPENDENT_AMBULATORY_CARE_PROVIDER_SITE_OTHER): Payer: Medicare Other | Admitting: Internal Medicine

## 2017-07-04 ENCOUNTER — Encounter: Payer: Self-pay | Admitting: Internal Medicine

## 2017-07-04 VITALS — BP 150/90 | HR 71 | Temp 97.7°F | Ht 63.0 in | Wt 138.5 lb

## 2017-07-04 DIAGNOSIS — D509 Iron deficiency anemia, unspecified: Secondary | ICD-10-CM

## 2017-07-04 DIAGNOSIS — I10 Essential (primary) hypertension: Secondary | ICD-10-CM | POA: Diagnosis not present

## 2017-07-04 DIAGNOSIS — K5900 Constipation, unspecified: Secondary | ICD-10-CM

## 2017-07-04 DIAGNOSIS — K4130 Unilateral femoral hernia, with obstruction, without gangrene, not specified as recurrent: Secondary | ICD-10-CM

## 2017-07-04 NOTE — Progress Notes (Signed)
Subjective:    Patient ID: Alexandra Reyes, female    DOB: [birth year], 84 y.o.   MRN: [id number]  HPI  Here after recent hernia repair; c/o persistent mild intermittent constipation, bloating since then.  Now on miralax and colace but did have an impaction requiring manual disempaction.  Has some medial right groin aspect with bleeding several days when first at home.  No other overt bleeding, now with bandiad only needed.  Still has some postop swelling.  Feels somewhat distended and bloating today, but Denies worsening reflux, worsening abd pain, dysphagia, n/v, or overt blood.  Wants to  Know if she can really take the miralax daily.  Has hx of post gravid anemia about 10-11 , tx with ferrous sulfate, and has taken most days since then.   Pt denies fever, wt loss, night sweats, loss of appetite, or other constitutional symptoms  Pt denies chest pain, increased sob or doe, wheezing, orthopnea, PND, increased LE swelling, palpitations, dizziness or syncope.   Pt denies polydipsia, polyuria Past Medical History:  Diagnosis Date  . Age-related macular degeneration, wet, both eyes (HCC)   . Alcohol dependence in remission (HCC) 05/22/2011  . Allergic rhinitis, cause unspecified 05/22/2011  . Allergy   . Anemia, iron deficiency 05/18/2011  . Cholelithiasis   . COPD (chronic obstructive pulmonary disease) (HCC)   . DDD (degenerative disc disease), lumbar 05/18/2011  . Depression    "@ times" (09/19/2016)  . First degree AV block   . GERD (gastroesophageal reflux disease)   . Hiatal hernia   . History of blood transfusion    "3 or 4 related to childbirth"  . HTN (hypertension) 05/18/2011   pt denies this hx on 09/19/2016  . Hyperlipidemia 05/18/2011  . Macular degeneration   . Myocardial infarction (HCC)    "EKG shows I've had 2; date unknown" (09/19/2016)  . OA (osteoarthritis)    "a little here and there; mainly in the back" (09/19/2016)  . Osteoporosis 05/18/2011  . PAT (paroxysmal atrial  tachycardia) (HCC)   . Pectus excavatum 05/18/2011  . Personal history of colonic polyps 10/28/2012  . Pneumonia    "as a child"  . SVT (supraventricular tachycardia) (HCC)   . Urinary frequency    Past Surgical History:  Procedure Laterality Date  . ABDOMINAL HYSTERECTOMY  1988  . BREAST BIOPSY Right 1948   "it was ok"  . CARDIAC CATHETERIZATION    . CARDIOVASCULAR STRESS TEST  03/08/2009   EF 84%  . CATARACT EXTRACTION W/ INTRAOCULAR LENS  IMPLANT, BILATERAL Bilateral   . INGUINAL HERNIA REPAIR Right 06/23/2017   Procedure: REPAIR INCARCERATED  RIGHT FEMORAL HERNIA WITH MESH;  Surgeon: Harriette Bouillonornett, Thomas, MD;  Location: [facility] OR;  Service: General;  Laterality: Right;  . KNEE ARTHROSCOPY Right   . RIGHT/LEFT HEART CATH AND CORONARY ANGIOGRAPHY N/A 07/25/2016   Procedure: Right/Left Heart Cath and Coronary Angiography;  Surgeon: Kathleene Hazelhristopher D McAlhany, MD;  [facility];  Service: Cardiovascular;  Laterality: N/A;  . SVT ABLATION  09/19/2016  . SVT ABLATION N/A 09/19/2016   Procedure: SVT Ablation;  Surgeon: Hillis RangeJames Allred, MD;  [facility];  Service: Cardiovascular;  Laterality: N/A;  . TONSILLECTOMY AND ADENOIDECTOMY    . US ECHOCARDIOGRAPHY  01/17/2003   EF 55-60%    reports that she quit smoking about 23 years ago. Her smoking use included cigarettes. She started smoking about 58 years ago. She has a 54.00 pack-year smoking history. she has never  used smokeless tobacco. She reports that she drinks alcohol. She reports that she does not use drugs. family history includes Arthritis in her mother; Heart disease in her father; Heart failure in her mother; Hypertension in her father; Kidney failure in her father. Allergies  Allergen Reactions  . Symbicort [Budesonide-Formoterol Fumarate]     Jittery in high doses, this has been reduced and is tolerated at this time.    Current Outpatient Medications on File Prior to Visit  Medication Sig Dispense Refill  .  acetaminophen (TYLENOL) 500 MG tablet Take 1,000 mg by mouth 2 (two) times daily as needed for moderate pain or headache.    . ALPRAZolam (XANAX) 0.25 MG tablet TAKE ONE TABLET BY MOUTH AT BEDTIME AS NEEDED FOR ANXIETY 90 tablet 1  . amLODipine (NORVASC) 5 MG tablet Take 1 tablet (5 mg total) by mouth daily. 90 tablet 3  . Ascorbic Acid (VITAMIN C PO) Take 1 tablet by mouth daily. Reported on 08/08/2015    . aspirin 81 MG tablet Take 81 mg by mouth at bedtime.     . B Complex Vitamins (VITAMIN B COMPLEX PO) Take 1 tablet by mouth daily.     . calcium carbonate (OS-CAL) 600 MG tablet Take 600 mg by mouth daily.    Marland Kitchen diltiazem (CARDIZEM) 30 MG tablet Take 1 tablet (30 mg total) by mouth 4 (four) times daily as needed. 120 tablet 11  . docusate sodium (COLACE) 100 MG capsule Take 100 mg by mouth daily.     . ferrous sulfate 325 (65 FE) MG tablet Take 325 mg by mouth daily.     . furosemide (LASIX) 40 MG tablet TAKE ONE TABLET BY MOUTH DAILY 90 tablet 3  . Glucosamine HCl-MSM (GLUCOSAMINE-MSM PO) Take 2 tablets by mouth daily.     . methocarbamol (ROBAXIN) 500 MG tablet Take 1 tablet (500 mg total) by mouth every 6 (six) hours as needed for muscle spasms. 30 tablet 0  . omeprazole (PRILOSEC) 40 MG capsule TAKE ONE CAPSULE BY MOUTH DAILY 30 capsule 3  . PRESCRIPTION MEDICATION Apply 1 Dose to eye every 3 (three) months. Eylea - eye injection    . simvastatin (ZOCOR) 10 MG tablet TAKE ONE TABLET BY MOUTH EVERY EVENING AT 6:00 PM 90 tablet 2  . SYMBICORT 80-4.5 MCG/ACT inhaler INHALE 1 PUFF INTO THE LUNGS TWICE A DAY AS DIRECTED 1 Inhaler 5  . tobramycin (TOBREX) 0.3 % ophthalmic solution Place 1 drop into the right eye 4 (four) times daily. Use the day before, the day of, and the day after eye injections    . traMADol (ULTRAM) 50 MG tablet Take 1 tablet (50 mg total) by mouth every 6 (six) hours as needed for moderate pain. 90 tablet 0  . triamcinolone cream (KENALOG) 0.1 % Apply 1 application topically  daily as needed (hives). Apply to affected area     . vitamin E 200 UNIT capsule Take 200 Units by mouth daily.    . nitroGLYCERIN (NITROSTAT) 0.4 MG SL tablet Place 1 tablet (0.4 mg total) under the tongue every 5 (five) minutes as needed for chest pain. (Patient not taking: Reported on 07/04/2017) 25 tablet 3   No current facility-administered medications on file prior to visit.    Review of Systems  Constitutional: Negative for other unusual diaphoresis or sweats HENT: Negative for ear discharge or swelling Eyes: Negative for other worsening visual disturbances Respiratory: Negative for stridor or other swelling  Gastrointestinal: Negative for worsening distension or  other blood Genitourinary: Negative for retention or other urinary change Musculoskeletal: Negative for other MSK pain or swelling Skin: Negative for color change or other new lesions Neurological: Negative for worsening tremors and other numbness  Psychiatric/Behavioral: Negative for worsening agitation or other fatigue All other system neg per pt    Objective:     BP (!) 150/90 (BP Location: Left Arm, Patient Position: Sitting, Cuff Size: Normal)   Pulse 71   Temp 97.7 F (36.5 C) (Oral)   Ht 5\' 3"  (1.6 m)   Wt 138 lb 8 oz (62.8 kg)   LMP  (LMP Unknown)   SpO2 98%   BMI 24.53 kg/m  VS noted,  Constitutional: Pt appears in NAD HENT: Head: NCAT.  Right Ear: External ear normal.  Left Ear: External ear normal.  Eyes: . Pupils are equal, round, and reactive to light. Conjunctivae and EOM are normal Nose: without d/c or deformity Neck: Neck supple. Gross normal ROM Cardiovascular: Normal rate and regular rhythm.   Pulmonary/Chest: Effort normal and breath sounds without rales or wheezing.  Abd:  Soft, NT, mild bloated appaering, + BS, no organomegaly Neurological: Pt is alert. At baseline orientation, motor grossly intact Skin: Skin is warm. No rashes, other new lesions, no LE edema Psychiatric: Pt behavior is  normal without agitation  No other exam findings     Assessment & Plan:

## 2017-07-04 NOTE — Patient Instructions (Addendum)
Ok to hold on taking the iron for at least 1-2 months.  OK to take the Miralax every day as needed  Please continue all other medications as before, including the tramadol if needed  Please have the pharmacy call with any other refills you may need.  Please continue your efforts at being more active, low cholesterol diet, and weight control.  Please keep your appointments with your specialists as you may have planned  Please return in 6 months, or sooner if needed

## 2017-07-06 NOTE — Assessment & Plan Note (Signed)
stable overall by history and exam, recent data reviewed with pt, and pt to continue medical treatment as before,  to f/u any worsening symptoms or concerns Lab Results  Component Value Date   WBC 14.6 (H) 06/25/2017   HGB 14.3 06/25/2017   HCT 42.1 06/25/2017   MCV 88.3 06/25/2017   PLT 337 06/25/2017  ok to hold iron for now as may be making constipation worse

## 2017-07-06 NOTE — Assessment & Plan Note (Signed)
Doing well post op overall, ok for miralax daily prbn

## 2017-07-06 NOTE — Assessment & Plan Note (Signed)
>>  ASSESSMENT AND PLAN FOR HTN (HYPERTENSION) WRITTEN ON 07/06/2017  4:39 PM BY Corwin Levins, MD  Mild elevated today likely situaitonal, o/w stable overall by history and exam, recent data reviewed with pt, and pt to continue medical treatment as before,  to f/u any worsening symptoms or concerns BP Readings from Last 3 Encounters:  07/04/17 (!) 150/90  06/25/17 124/66  06/23/17 (!) 175/83

## 2017-07-06 NOTE — Assessment & Plan Note (Signed)
Mild elevated today likely situaitonal, o/w stable overall by history and exam, recent data reviewed with pt, and pt to continue medical treatment as before,  to f/u any worsening symptoms or concerns BP Readings from Last 3 Encounters:  07/04/17 (!) 150/90  06/25/17 124/66  06/23/17 (!) 175/83

## 2017-07-07 ENCOUNTER — Encounter (HOSPITAL_COMMUNITY): Payer: Self-pay | Admitting: Surgery

## 2017-07-08 ENCOUNTER — Other Ambulatory Visit: Payer: Self-pay | Admitting: Internal Medicine

## 2017-07-09 ENCOUNTER — Ambulatory Visit (INDEPENDENT_AMBULATORY_CARE_PROVIDER_SITE_OTHER): Payer: Medicare Other | Admitting: Acute Care

## 2017-07-09 ENCOUNTER — Encounter: Payer: Self-pay | Admitting: Acute Care

## 2017-07-09 DIAGNOSIS — R079 Chest pain, unspecified: Secondary | ICD-10-CM

## 2017-07-09 DIAGNOSIS — J449 Chronic obstructive pulmonary disease, unspecified: Secondary | ICD-10-CM | POA: Diagnosis not present

## 2017-07-09 DIAGNOSIS — K219 Gastro-esophageal reflux disease without esophagitis: Secondary | ICD-10-CM | POA: Diagnosis not present

## 2017-07-09 MED ORDER — GABAPENTIN 100 MG PO CAPS: 100.0000 mg | ORAL_CAPSULE | Freq: Two times a day (BID) | ORAL | 3 refills | Status: DC

## 2017-07-09 NOTE — Progress Notes (Signed)
History of Present Illness Alexandra Reyes is a 84 y.o. female former smoker quit 1996,  with  ILD, Atypical Chest Pain, & GERD.  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 Serologic workup negative. Not currently on immunosuppression/treatment. She reports dyspnea only with her palpitations. No new cough. Continuing to use Symbicort as prescribed.  Atypical chest pain: Previously responded to prednisone therapy. Previously also was some pleuritic component. Relieved with Ultram. Recommended investigation of massage therapy at last appointment & deferred referral to pain specialist. She has been using the Tramadol up to two times daily to help with her chest pain. Pain is still relieved with rest. She still reports the pain is a "pressure" and relieved with laying more recumbent. She reports her pain was worse after she tried massage therapy. She reports her pain is worse on the right side but does cross the midpoint with continued activity. She has also used moist heat for some relief. She reports it took her a week to recover. She is also taking Tylenol intermittent for some relief as well. She states she is compliant with her prilosec, it made no difference.   GERD: Previously utilizing Tums intermittently. Previously evaluated by GI. No reflux or dyspepsia.  07/09/2017 Follow Up: Pt. Presents for follow up. She was Last seen by Dr. Ashok Cordia 02/2017.She had a recent admission for an incarcerated hernia and had emergency surgery 06/2017. She is here today for atypical chest pain. She has had this pain before. She had physical therapy x 1 appointment and stated that she could not go back.She states her breathing is stable and at baseline. She is compliant with her Symbicort. She states she is here with chest pain that she states hurts during the afternoon at about 2 or 3 pm and subsides with laying down at night. She has been fitted with a bra.that also made it no better. She  states lifting her breast up makes the pain go away. She states that prednisone does improve her pain.She states she has had a cardiology work up. She had ablation per Dr. Joylene Grapes. She has seen Dr. Henrene Pastor for this also. She uses Tramadol and tylenol for pain.She states she is compliant with her prilosec, it made no difference. She states her cough has gone completely.She denies fever, chest pain, orthopnea or hemoptysis.    Plan after last visit: Dr. Ashok Cordia: 1. ILD:  Continuing to hold on immunosuppression. Patient continuing on Symbicort given some symptomatic improvement previously noted. 2. Atypical chest pain: Refilled patient's Ultram prescription. Referring patient to pain medicine specialist. 3. GERD: No symptoms. No new medications. 4. Health maintenance: Status post Prevnar January 2015 & Pneumovax January 1998. Recommended influenza vaccine next month. 5. Follow-up: Return to clinic in 3 months or sooner if needed.  Test Results:  PFT 07/12/15: FVC 2.29 L (106%) FEV1 1.57 L (99%) FEV1/FVC 0.68 FEF 25-75 0.78 L (81%) no bronchodilator response TLC 3.59 L (73%) RV 52% ERV 342% DLCO uncorrected 52% 05/14/13: FVC 2.34 L (103%) FEV1 1.63 L (97%) FEV1/FVC 0.69 FEF 25-75 0.83 L (76%) no bronchodilator response TLC 4.51 L (92%) ERV 284% DLCO uncorrected 55%  IMAGING CT CHEST W/O 04/03/16 (previously reviewed by me):  No pleural effusion or thickening. No pathologic mediastinal adenopathy. No pericardial effusion. Basilar predominant subpleural reticular changes that are mild and more pronounced on CT imaging.  CT CHEST W/O 10/09/15 (previously reviewed by me):  No pneumothorax or effusion. Minimal posterior subsegmental atelectasis. No appreciable  bronchiectasis, mass, or nodule. No pathologic mediastinal adenopathy. No pericardial effusion. Mild coronary artery calcification.  BARIUM SWALLOW (08/11/15): No evidence for stricture or mass lesion. Moderate esophageal dysmotility without hiatal  hernia. Small amount spontaneous reflux into the lower third of the esophagus.  CTA CHEST 08/03/15 (previously reviewed by me): Mild lower lobe predominant atelectasis. No other nodule or opacity appreciated. No ulnar emboli. No pleural effusion or thickening. No pericardial effusion. No pathologic mediastinal adenopathy.  CARDIAC LHC (07/25/16):  Prox RCA lesion, 100 %stenosed.  Prox LAD lesion, 20 %stenosed.  LV end diastolic pressure is normal.  1. 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 The mid and distal vessel fills briskly from left to right collaterals.  2. Mild non-obstructive disease in the LAD 3. Normal filling pressures.  4. N specific ormal PA pressures  TTE (07/07/15): Narrow LVOT with normal cavity size. Mild LVH. EF 55-60%. Grade 1 diastolic dysfunction. LA & RA normal in size. RV normal in size and function. No aortic regurgitation. No mitral stenosis or regurgitation. No pulmonic regurgitation. Mild tricuspid regurgitation. No pericardial effusion.  LABS 04/17/16 CRP:  0.2 ESR:  19 ANA:  Negative Anti-CCP:  <16 RF:  <14 SCL-70:  <1.0 Hypersensitivity Pneumonitis Panel:  Negative   08/02/15 CBC: 9.2/14.4/42.7/390 BMP: 133/4.3/98/28/17/1.09/94/9.5 LFT: 4.2/7.0/0.5/62/18/16  CBC Latest Ref Rng & Units 06/25/2017 06/24/2017 06/23/2017  WBC 4.0 - 10.5 K/uL 14.6(H) 10.6(H) 12.0(H)  Hemoglobin 12.0 - 15.0 g/dL 14.3 15.5(H) 17.3(H)  Hematocrit 36.0 - 46.0 % 42.1 46.0 48.9(H)  Platelets 150 - 400 K/uL 337 356 351    BMP Latest Ref Rng & Units 06/25/2017 06/24/2017 06/23/2017  Glucose 65 - 99 mg/dL 106(H) 231(H) 107(H)  BUN 6 - 20 mg/dL '14 11 13  ' Creatinine 0.44 - 1.00 mg/dL 0.98 1.27(H) 0.99  BUN/Creat Ratio 12 - 28 - - -  Sodium 135 - 145 mmol/L 135 132(L) 131(L)  Potassium 3.5 - 5.1 mmol/L 3.5 3.4(L) 3.6  Chloride 101 - 111 mmol/L 103 98(L) 96(L)  CO2 22 - 32 mmol/L 23 20(L) 20(L)  Calcium 8.9 - 10.3 mg/dL 8.3(L) 8.6(L) 9.6     ProBNP    Component  Value Date/Time   PROBNP 104.0 (H) 02/07/2011 1125    PFT    Component Value Date/Time   FEV1PRE 1.57 07/12/2015 1552   FEV1POST 1.76 07/12/2015 1552   FVCPRE 2.29 07/12/2015 1552   FVCPOST 2.39 07/12/2015 1552   TLC 4.51 04/28/2013 1158   DLCOUNC 12.09 07/12/2015 1552   PREFEV1FVCRT 68 07/12/2015 1552   PSTFEV1FVCRT 74 07/12/2015 1552    Ct Abdomen Pelvis W Contrast  Result Date: 06/23/2017 CLINICAL DATA:  Nausea and vomiting. EXAM: CT ABDOMEN AND PELVIS WITH CONTRAST TECHNIQUE: Multidetector CT imaging of the abdomen and pelvis was performed using the standard protocol following bolus administration of intravenous contrast. CONTRAST:  180m ISOVUE-300 IOPAMIDOL (ISOVUE-300) INJECTION 61% COMPARISON:  100 cc of Isovue-300 FINDINGS: Lower chest: No acute abnormality. Hepatobiliary: No focal liver abnormality is seen. No gallstones, gallbladder wall thickening, or biliary dilatation. Pancreas: Unremarkable. No pancreatic ductal dilatation or surrounding inflammatory changes. Spleen: Normal in size without focal abnormality. Adrenals/Urinary Tract: Normal adrenal glands. Bilateral renal cortical scarring identified. There are several right kidney cysts. No hydronephrosis or mass identified. Urinary bladder appears unremarkable. Stomach/Bowel: The stomach is normal. There is pathologic dilatation of the proximal and mid small bowel loops. Transition point to decreased caliber small bowel loops is within the right inguinal hernia. Unremarkable appearance of the:. Vascular/Lymphatic: Aortic atherosclerosis. No  aneurysm. No upper abdominal or pelvic adenopathy. No inguinal adenopathy. Reproductive: Status post hysterectomy. No adnexal masses. Other: No free fluid or fluid collections. Musculoskeletal: No acute or significant osseous findings. Scoliosis and degenerative disc disease identified. IMPRESSION: 1. Examination is positive for small bowel obstruction. 2. Right inguinal hernia containing  obstructed loop of small bowel represents the transition point for the obstruction. 3.  Aortic Atherosclerosis (ICD10-I70.0). 4. Bilateral renal cortical scarring. Electronically Signed   By: Kerby Moors M.D.   On: 06/23/2017 19:43     Past medical hx Past Medical History:  Diagnosis Date  . Age-related macular degeneration, wet, both eyes ([place])   . Alcohol dependence in remission ([place]) 05/22/2011  . Allergic rhinitis, cause unspecified 05/22/2011  . Allergy   . Anemia, iron deficiency 05/18/2011  . Cholelithiasis   . COPD (chronic obstructive pulmonary disease) ([place])   . DDD (degenerative disc disease), lumbar 05/18/2011  . Depression    "@ times" (09/19/2016)  . First degree AV block   . GERD (gastroesophageal reflux disease)   . Hiatal hernia   . History of blood transfusion    "3 or 4 related to childbirth"  . HTN (hypertension) 05/18/2011   pt denies this hx on 09/19/2016  . Hyperlipidemia 05/18/2011  . Macular degeneration   . Myocardial infarction ([place])    "EKG shows I've had 2; date unknown" (09/19/2016)  . OA (osteoarthritis)    "a little here and there; mainly in the back" (09/19/2016)  . Osteoporosis 05/18/2011  . PAT (paroxysmal atrial tachycardia) ([place])   . Pectus excavatum 05/18/2011  . Personal history of colonic polyps 10/28/2012  . Pneumonia    "as a child"  . SVT (supraventricular tachycardia) ([place])   . Urinary frequency      Social History   Tobacco Use  . Smoking status: Former Smoker    Packs/day: 1.50    Years: 36.00    Pack years: 54.00    Types: Cigarettes    Start date: 03/01/1959    Last attempt to quit: 06/10/1994    Years since quitting: 23.0  . Smokeless tobacco: Never Used  . Tobacco comment: smoked 1- 1.5 ppd.   Substance Use Topics  . Alcohol use: Yes    Alcohol/week: 0.0 oz    Comment: 09/19/2016 "quit drinking in ~ 1978"  . Drug use: No    Ms.Cabreja reports that she quit smoking about 23 years ago. Her smoking use included cigarettes.  She started smoking about 58 years ago. She has a 54.00 pack-year smoking history. she has never used smokeless tobacco. She reports that she drinks alcohol. She reports that she does not use drugs.  Tobacco Cessation: Former smoker , quit 1996 54 pack year smoker  Past surgical hx, Family hx, Social hx all reviewed.  Current Outpatient Medications on File Prior to Visit  Medication Sig  . acetaminophen (TYLENOL) 500 MG tablet Take 1,000 mg by mouth 2 (two) times daily as needed for moderate pain or headache.  . ALPRAZolam (XANAX) 0.25 MG tablet TAKE ONE TABLET BY MOUTH AT BEDTIME AS NEEDED FOR ANXIETY  . amLODipine (NORVASC) 5 MG tablet Take 1 tablet (5 mg total) by mouth daily.  . Ascorbic Acid (VITAMIN C PO) Take 1 tablet by mouth daily. Reported on 08/08/2015  . aspirin 81 MG tablet Take 81 mg by mouth at bedtime.   . B Complex Vitamins (VITAMIN B COMPLEX PO) Take 1 tablet by mouth daily.   . calcium carbonate (OS-CAL) 600  MG tablet Take 600 mg by mouth daily.  Marland Kitchen diltiazem (CARDIZEM CD) 120 MG 24 hr capsule Take 120 mg by mouth daily.  Marland Kitchen diltiazem (CARDIZEM) 30 MG tablet Take 1 tablet (30 mg total) by mouth 4 (four) times daily as needed.  . docusate sodium (COLACE) 100 MG capsule Take 100 mg by mouth daily.   . furosemide (LASIX) 40 MG tablet TAKE ONE TABLET BY MOUTH DAILY  . Glucosamine HCl-MSM (GLUCOSAMINE-MSM PO) Take 2 tablets by mouth daily.   . nitroGLYCERIN (NITROSTAT) 0.4 MG SL tablet Place 1 tablet (0.4 mg total) under the tongue every 5 (five) minutes as needed for chest pain.  Marland Kitchen omeprazole (PRILOSEC) 40 MG capsule TAKE ONE CAPSULE BY MOUTH DAILY  . PRESCRIPTION MEDICATION Apply 1 Dose to eye every 3 (three) months. Eylea - eye injection  . simvastatin (ZOCOR) 10 MG tablet TAKE ONE TABLET BY MOUTH EVERY EVENING AT 6:00 PM  . SYMBICORT 80-4.5 MCG/ACT inhaler INHALE 1 PUFF INTO THE LUNGS TWICE A DAY AS DIRECTED  . tobramycin (TOBREX) 0.3 % ophthalmic solution Place 1 drop into  the right eye 4 (four) times daily. Use the day before, the day of, and the day after eye injections  . traMADol (ULTRAM) 50 MG tablet Take 1 tablet (50 mg total) by mouth every 6 (six) hours as needed for moderate pain.  Marland Kitchen triamcinolone cream (KENALOG) 0.1 % Apply 1 application topically daily as needed (hives). Apply to affected area   . vitamin E 200 UNIT capsule Take 200 Units by mouth daily.  . methocarbamol (ROBAXIN) 500 MG tablet Take 1 tablet (500 mg total) by mouth every 6 (six) hours as needed for muscle spasms. (Patient not taking: Reported on 07/09/2017)   No current facility-administered medications on file prior to visit.      Allergies  Allergen Reactions  . Symbicort [Budesonide-Formoterol Fumarate]     Jittery in high doses, this has been reduced and is tolerated at this time.     Review Of Systems:  Constitutional:   No  weight loss, night sweats,  Fevers, chills, fatigue, or  lassitude.  HEENT:   No headaches,  Difficulty swallowing,  Tooth/dental problems, or  Sore throat,                No sneezing, itching, ear ache, nasal congestion,  post nasal drip,   CV:  +  Chest wall pain beneath breasts,  No Orthopnea, PND, swelling in lower extremities, anasarca, dizziness, palpitations, syncope.   GI  No heartburn, indigestion, abdominal pain, nausea, vomiting, diarrhea, change in bowel habits, loss of appetite, bloody stools.   Resp: No shortness of breath with exertion or at rest.  No excess mucus, no productive cough,  No non-productive cough,  No coughing up of blood.  No change in color of mucus.  No wheezing.  No chest wall deformity  Skin: no rash or lesions.  GU: no dysuria, change in color of urine, no urgency or frequency.  No flank pain, no hematuria   MS:  No joint pain or swelling.  No decreased range of motion.  No back pain.  Psych:  No change in mood or affect. No depression or anxiety.  No memory loss.   Vital Signs BP 136/72 (BP Location: Left Arm,  Cuff Size: Normal)   Pulse 85   Ht '5\' 3"'  (1.6 m)   Wt 136 lb (61.7 kg)   LMP  (LMP Unknown)   SpO2 99%   BMI 24.09  kg/m    Physical Exam:  General- No distress,  A&Ox3, pleasant, talkative ENT: No sinus tenderness, TM clear, pale nasal mucosa, no oral exudate,no post nasal drip, no LAN Cardiac: S1, S2, regular rate and rhythm, no murmur Chest: No wheeze/ rales/ dullness; no accessory muscle use, no nasal flaring, no sternal retractions, diminished per bases Abd.: Soft Non-tender, obese, BS + Ext: No clubbing cyanosis, edema Neuro:  normal strength, MAE x 4, A&O x 3 Skin: No rashes, warm and dry,no lesions Psych: normal mood and behavior, pleasant and talkative   Assessment/Plan  COPD (chronic obstructive pulmonary disease) Stable interval Plan: Continue Symbicort 2 puffs twice daily. Rinse mouth after use. Follow up in 4 weeks with Judson Roch to determine if you are better with the Neurontin. Follow up with Dr. [place] Bells in 4 months or before if needed. Please contact office for sooner follow up if symptoms do not improve or worsen or seek emergency care     GERD (gastroesophageal reflux disease)  Continue Prilosec Follow up with Dr. [place] Bells in 4 months or before if needed. Please contact office for sooner follow up if symptoms do not improve or worsen or seek emergency care     Chest pain Chest Wall Pian Starts hurting in the afternoon about 2-3 pm Relieved with Tramadol and Tylenol Plan: We will re-send a referral to pain management. Please have PCC's call and make an appointment. Try Neurontin 100 mg twice daily for pain. Do not use with Tramadol  Do not drive if sleepy. If this does not help, then stop the neurontin and resume tramadol. Continue Tylenol as you have been doing. Follow up in 4 weeks with Judson Roch to determine if you are better with the Neurontin. Follow up with Dr. [place] Bells in 4 months or before if needed. Please contact office for sooner follow up if  symptoms do not improve or worsen or seek emergency care       Magdalen Spatz, NP 07/09/2017  9:01 PM

## 2017-07-09 NOTE — Patient Instructions (Addendum)
It is nice to see you today. We will re-send a referral to pain management. Please have PCC's call and make an appointment. Continue Symbicort 2 puffs twice daily. Rinse mouth after use. Try Neurontin 100 mg twice daily for pain. Do not use with Tramadol  Do not drive if sleepy. If this does not help, then stop the neurontin and resume tramadol. Continue Tylenol as you have been doing. Follow up in 4 weeks with [name] to determine if you are better with the Neurontin. Follow up with Dr. Kendrick FriesMcQuaid in 4 months or before if needed. Please contact office for sooner follow up if symptoms do not improve or worsen or seek emergency care

## 2017-07-09 NOTE — Assessment & Plan Note (Signed)
Stable interval Plan: Continue Symbicort 2 puffs twice daily. Rinse mouth after use. Follow up in 4 weeks with [name] to determine if you are better with the Neurontin. Follow up with Dr. Kendrick FriesMcQuaid in 4 months or before if needed. Please contact office for sooner follow up if symptoms do not improve or worsen or seek emergency care

## 2017-07-09 NOTE — Assessment & Plan Note (Signed)
Chest Wall Pian Starts hurting in the afternoon about 2-3 pm Relieved with Tramadol and Tylenol Plan: We will re-send a referral to pain management. Please have PCC's call and make an appointment. Try Neurontin 100 mg twice daily for pain. Do not use with Tramadol  Do not drive if sleepy. If this does not help, then stop the neurontin and resume tramadol. Continue Tylenol as you have been doing. Follow up in 4 weeks with [name] to determine if you are better with the Neurontin. Follow up with Dr. Kendrick FriesMcQuaid in 4 months or before if needed. Please contact office for sooner follow up if symptoms do not improve or worsen or seek emergency care

## 2017-07-09 NOTE — Assessment & Plan Note (Signed)
  Continue Prilosec Follow up with Dr. Kendrick FriesMcQuaid in 4 months or before if needed. Please contact office for sooner follow up if symptoms do not improve or worsen or seek emergency care

## 2017-07-10 NOTE — Progress Notes (Signed)
Reviewed, agree 

## 2017-07-11 ENCOUNTER — Other Ambulatory Visit: Payer: Self-pay | Admitting: Surgery

## 2017-07-11 DIAGNOSIS — R140 Abdominal distension (gaseous): Secondary | ICD-10-CM

## 2017-07-15 ENCOUNTER — Telehealth: Payer: Self-pay | Admitting: Acute Care

## 2017-07-15 NOTE — Telephone Encounter (Signed)
lmtcb for Alexandra [facility] with [facility]

## 2017-07-16 DIAGNOSIS — F4329 Adjustment disorder with other symptoms: Secondary | ICD-10-CM | POA: Diagnosis not present

## 2017-07-16 NOTE — Telephone Encounter (Signed)
Called Misty StanleyLisa unable to reach will await call back.

## 2017-07-16 NOTE — Telephone Encounter (Signed)
[facility] is calling back [phone]

## 2017-07-16 NOTE — Telephone Encounter (Signed)
Called Lisa x 2 LMTCB

## 2017-07-16 NOTE — Telephone Encounter (Addendum)
Left a message for Misty StanleyLisa to call back. I just want to know which pain clinic she is referring pt to so we can follow up on this. The patient has osteoarthritis, back pain, and many other pain related issues.

## 2017-07-17 NOTE — Telephone Encounter (Signed)
lmtcb X3 for [employer]isa.

## 2017-07-18 NOTE — Telephone Encounter (Signed)
Will close this message since Misty StanleyLisa has been contacted 3 times.

## 2017-07-21 ENCOUNTER — Ambulatory Visit
Admission: RE | Admit: 2017-07-21 | Discharge: 2017-07-21 | Disposition: A | Discharge status: Home / Self Care | Payer: Medicare Other | Source: Ambulatory Visit | Attending: Surgery | Admitting: Surgery

## 2017-07-21 DIAGNOSIS — R1032 Left lower quadrant pain: Secondary | ICD-10-CM | POA: Diagnosis not present

## 2017-07-21 DIAGNOSIS — R140 Abdominal distension (gaseous): Secondary | ICD-10-CM

## 2017-07-31 ENCOUNTER — Telehealth: Payer: Self-pay | Admitting: Acute Care

## 2017-07-31 DIAGNOSIS — R0789 Other chest pain: Secondary | ICD-10-CM

## 2017-07-31 NOTE — Telephone Encounter (Signed)
When the original order was placed back in September, it was associated with ILD. [facility] called us earlier this month and told us that they could not treat her for ILD. Per Dr. Crista CurbNestor's documentation the referral was to be placed for chest wall pain not ILD. I [facility], she checked with the nurse to see if they could evaluate her for chest wall pain. Misty StanleyLisa states that they can but they would need another referral. I have made the pt aware of this information.  SG - please advise if you would be willing to make a new referral. Thanks.

## 2017-08-01 NOTE — Telephone Encounter (Signed)
Ok to make referral.Thanks so much

## 2017-08-01 NOTE — Telephone Encounter (Signed)
New order has been placed. 

## 2017-08-07 ENCOUNTER — Encounter: Payer: Self-pay | Admitting: Acute Care

## 2017-08-07 NOTE — Telephone Encounter (Signed)
Per pt's chart a new referral was placed on 2/21 No further documentation after that  ATC the pain clinic @ [phone] but they are closed E-mail sent to patient informing her of the above  Will call the pain clinic again in the morning Routing back to triage

## 2017-08-08 NOTE — Telephone Encounter (Signed)
[facility] [phone] and had to leave a message for Sigmund HazelLisa Miller to call me back

## 2017-08-08 NOTE — Telephone Encounter (Signed)
PCCs please advise on status of pain clinic referral.  Thanks!

## 2017-08-11 ENCOUNTER — Encounter: Payer: Self-pay | Admitting: Physical Medicine & Rehabilitation

## 2017-08-11 NOTE — Telephone Encounter (Signed)
Misty StanleyLisa just called and stated the patient has a appt on 08/26/17 @ 9:30am

## 2017-08-11 NOTE — Telephone Encounter (Signed)
Spoke to Manuella [facility].  She was going to call pt to set up appt as soon as she got off the phone with me.

## 2017-08-20 DIAGNOSIS — F4329 Adjustment disorder with other symptoms: Secondary | ICD-10-CM | POA: Diagnosis not present

## 2017-08-26 ENCOUNTER — Encounter: Payer: Self-pay | Admitting: Physical Medicine & Rehabilitation

## 2017-08-26 ENCOUNTER — Ambulatory Visit (HOSPITAL_BASED_OUTPATIENT_CLINIC_OR_DEPARTMENT_OTHER): Payer: Medicare Other | Admitting: Physical Medicine & Rehabilitation

## 2017-08-26 ENCOUNTER — Encounter: Payer: Medicare Other | Attending: Physical Medicine & Rehabilitation

## 2017-08-26 VITALS — BP 138/82 | HR 82

## 2017-08-26 DIAGNOSIS — M940 Chondrocostal junction syndrome [Tietze]: Secondary | ICD-10-CM | POA: Diagnosis not present

## 2017-08-26 DIAGNOSIS — M6281 Muscle weakness (generalized): Secondary | ICD-10-CM | POA: Insufficient documentation

## 2017-08-26 DIAGNOSIS — G588 Other specified mononeuropathies: Secondary | ICD-10-CM

## 2017-08-26 DIAGNOSIS — Z9889 Other specified postprocedural states: Secondary | ICD-10-CM | POA: Diagnosis not present

## 2017-08-26 DIAGNOSIS — R079 Chest pain, unspecified: Secondary | ICD-10-CM | POA: Insufficient documentation

## 2017-08-26 DIAGNOSIS — M792 Neuralgia and neuritis, unspecified: Secondary | ICD-10-CM | POA: Diagnosis not present

## 2017-08-26 MED ORDER — DICLOFENAC SODIUM 1 % TD GEL: 2.0000 g | Freq: Four times a day (QID) | TRANSDERMAL | 2 refills | Status: DC

## 2017-08-26 MED ORDER — GABAPENTIN 100 MG PO CAPS: 100.0000 mg | ORAL_CAPSULE | Freq: Three times a day (TID) | ORAL | 3 refills | Status: DC

## 2017-08-26 NOTE — Patient Instructions (Addendum)
Use Voltaren gel 4 times a day to painful area  Increase Neurontin 3 times a day  PT will call you

## 2017-08-26 NOTE — Progress Notes (Signed)
Subjective:    Patient ID: Alexandra Reyes, female    DOB: 12-Jun-1925, 84 y.o.   MRN: [id number]  HPI CC:  Chest pain 84 yo female with >71yr hx of tachycardia who had an ablation that gave 4 mo relief of tachycardia but no relief of chest pain. Also had dx of costochondritis diagnosed by pulmonary and this was relieved with NSAID  No hx of trauma to chest wall or surgery such as thoracotomy , no hx shingles  Pain is relieved by raising arms overhead or by supporting breasts in a reclined position as well as leaning forward No pain at night, has pain mainly in mid afternoon Has tried various bras which have not been helpful in relieving pain  Pt has lost 20 lb , CT chest shows no tumour.   Gabapentin partially helpful but causes drowsiness  Seen by GI for clinical diagnosis no GERD, no endoscopy recommended Seen by massage therapy did what sounds like intercostal massage which caused an exacerbation of pain.  Pain Inventory Average Pain 8 Pain Right Now 0 now, can be up to an 8 My pain is aching  In the last 24 hours, has pain interfered with the following? General activity 7 Relation with others 2 Enjoyment of life 3 What TIME of day is your pain at its worst? evening Sleep (in general) Fair  Pain is worse with: walking and some activites Pain improves with: rest Relief from Meds: 2  Mobility walk without assistance  Function retired  Neuro/Psych No problems in this area  Prior Studies Any changes since last visit?  no  Physicians involved in your care Any changes since last visit?  no   Family History  Problem Relation Age of Onset  . Heart failure Mother   . Arthritis Mother   . Kidney failure Father   . Heart disease Father   . Hypertension Father    Social History   Socioeconomic History  . Marital status: Widowed    Spouse name: None  . Number of children: Y  . Years of education: 78  . Highest education level: None  Social Needs  .  Financial resource strain: None  . Food insecurity - worry: None  . Food insecurity - inability: None  . Transportation needs - medical: None  . Transportation needs - non-medical: None  Occupational History  . Occupation: Retired Designer, jewellery  Tobacco Use  . Smoking status: Former Smoker    Packs/day: 1.50    Years: 36.00    Pack years: 54.00    Types: Cigarettes    Start date: 03/01/1959    Last attempt to quit: 06/10/1994    Years since quitting: 23.2  . Smokeless tobacco: Never Used  Substance and Sexual Activity  . Alcohol use: No    Frequency: Never  . Drug use: No  . Sexual activity: None  Other Topics Concern  . None  Social History Narrative   Originally from [place], [place]. She moved to [place] in 1950 during the polio epidemic. 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 Remote bird exposure. No mold or hot tub exposure.    Past Surgical History:  Procedure Laterality Date  . ABDOMINAL HYSTERECTOMY  1988  . BREAST BIOPSY Right 1948   "it was ok"  . CARDIAC CATHETERIZATION    . CARDIOVASCULAR STRESS TEST  03/08/2009   EF 84%  . CATARACT EXTRACTION W/ INTRAOCULAR LENS  IMPLANT, BILATERAL Bilateral   . INGUINAL HERNIA REPAIR Right  06/23/2017   Procedure: REPAIR INCARCERATED  RIGHT FEMORAL HERNIA WITH MESH;  Surgeon: Harriette Bouillonornett, Thomas, MD;  Location: [facility] OR;  Service: General;  Laterality: Right;  . KNEE ARTHROSCOPY Right   . RIGHT/LEFT HEART CATH AND CORONARY ANGIOGRAPHY N/A 07/25/2016   Procedure: Right/Left Heart Cath and Coronary Angiography;  Surgeon: Kathleene Hazelhristopher D McAlhany, MD;  [facility];  Service: Cardiovascular;  Laterality: N/A;  . SVT ABLATION  09/19/2016  . SVT ABLATION N/A 09/19/2016   Procedure: SVT Ablation;  Surgeon: Hillis RangeJames Allred, MD;  [facility];  Service: Cardiovascular;  Laterality: N/A;  . TONSILLECTOMY AND ADENOIDECTOMY    . US ECHOCARDIOGRAPHY  01/17/2003   EF 55-60%   Past Medical History:  Diagnosis Date  .  Age-related macular degeneration, wet, both eyes (HCC)   . Alcohol dependence in remission (HCC) 05/22/2011  . Allergic rhinitis, cause unspecified 05/22/2011  . Allergy   . Anemia, iron deficiency 05/18/2011  . Cholelithiasis   . COPD (chronic obstructive pulmonary disease) (HCC)   . DDD (degenerative disc disease), lumbar 05/18/2011  . Depression    "@ times" (09/19/2016)  . First degree AV block   . GERD (gastroesophageal reflux disease)   . Hiatal hernia   . History of blood transfusion    "3 or 4 related to childbirth"  . HTN (hypertension) 05/18/2011   pt denies this hx on 09/19/2016  . Hyperlipidemia 05/18/2011  . Macular degeneration   . Myocardial infarction (HCC)    "EKG shows I've had 2; date unknown" (09/19/2016)  . OA (osteoarthritis)    "a little here and there; mainly in the back" (09/19/2016)  . Osteoporosis 05/18/2011  . PAT (paroxysmal atrial tachycardia) (HCC)   . Pectus excavatum 05/18/2011  . Personal history of colonic polyps 10/28/2012  . Pneumonia    "as a child"  . SVT (supraventricular tachycardia) (HCC)   . Urinary frequency    BP 138/82   Pulse 82   LMP  (LMP Unknown)   SpO2 96%   Opioid Risk Score:   Fall Risk Score:  `1  Depression screen PHQ 2/9  [facility] 07/04/2017 12/25/2016 09/11/2016 08/02/2015 06/30/2013 04/28/2013  Decreased Interest 0 0 0 0 0 0  Down, Depressed, Hopeless 1 1 0 0 0 1  PHQ - 2 Score 1 1 0 0 0 1  Altered sleeping - 1 - - - -  Tired, decreased energy - 1 - - - -  Change in appetite - 1 - - - -  Feeling bad or failure about yourself  - 0 - - - -  Trouble concentrating - 0 - - - -  Moving slowly or fidgety/restless - 0 - - - -  Suicidal thoughts - 0 - - - -  PHQ-9 Score - 4 - - - -  Difficult doing work/chores - Not difficult at all - - - -     Review of Systems  Constitutional: Positive for unexpected weight change.  HENT: Negative.   Eyes: Negative.   Respiratory: Negative.   Cardiovascular: Positive for  chest pain.       Non cardiac related  Gastrointestinal: Negative.   Endocrine: Negative.   Genitourinary: Negative.   Musculoskeletal: Positive for arthralgias and myalgias.  Skin: Negative.   Allergic/Immunologic: Negative.   Neurological: Negative.   Hematological: Negative.   Psychiatric/Behavioral: Negative.   All other systems reviewed and are negative.      Objective:   Physical Exam  Constitutional: She is oriented to person, place, and time. She appears well-developed and well-nourished. No distress.  HENT:  Head: Normocephalic and atraumatic.  Eyes: Conjunctivae and EOM are normal. Pupils are equal, round, and reactive to light.  Neck: Normal range of motion.  Cardiovascular: Normal rate, regular rhythm and normal heart sounds. Exam reveals no friction rub.  No murmur heard. Pulmonary/Chest: Effort normal and breath sounds normal. No respiratory distress. She has no wheezes. She exhibits tenderness.  Abdominal: Soft. Bowel sounds are normal. She exhibits no distension. There is no tenderness.  Musculoskeletal:       Cervical back: She exhibits normal range of motion, no tenderness, no bony tenderness and no deformity.       Thoracic back: She exhibits normal range of motion, no tenderness, no bony tenderness and no deformity.       Lumbar back: She exhibits normal range of motion, no tenderness, no bony tenderness and no deformity.  Tenderness R>L sternocostal junction  Tenderness Right 7th rib interspace starting below angle of scapula extending to the mid axillary line  Head forward posture  Neurological: She is alert and oriented to person, place, and time. No sensory deficit. She exhibits normal muscle tone. Coordination and gait normal.  5/5 in bilateral Delt, Bi, Tri, grip, HF, KE ADF  Skin: Skin is warm and dry. She is not diaphoretic.  No evidence of vescicles or scarring in the Right 7th intercostal space  Psychiatric: She has a normal mood and affect. Her  behavior is normal. Judgment and thought content normal.  Nursing note and vitals reviewed.      Assessment & Plan:  1.  Costochondritis More widespread on Right than on the left.  The pt has responded to oral NSAIDs but given her age I am reluctant to prescibe this on a long term basis.  Will trial voltaren gel OTC  2.  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  Will increase Neurotin to 100mg  TID,monitor for increase drowsiness  3.  Postural factors , head forward posture, weak periscapular musculature contributing to her pain symptoms- referral to PT

## 2017-09-05 DIAGNOSIS — H353212 Exudative age-related macular degeneration, right eye, with inactive choroidal neovascularization: Secondary | ICD-10-CM | POA: Diagnosis not present

## 2017-09-05 DIAGNOSIS — H3540 Unspecified peripheral retinal degeneration: Secondary | ICD-10-CM | POA: Diagnosis not present

## 2017-09-05 DIAGNOSIS — H353223 Exudative age-related macular degeneration, left eye, with inactive scar: Secondary | ICD-10-CM | POA: Diagnosis not present

## 2017-09-05 DIAGNOSIS — H35371 Puckering of macula, right eye: Secondary | ICD-10-CM | POA: Diagnosis not present

## 2017-09-08 ENCOUNTER — Telehealth: Payer: Self-pay | Admitting: Acute Care

## 2017-09-08 NOTE — Telephone Encounter (Signed)
She was supposed to follow up 4 weeks after the Neurontin  prescription was written. She did not do this. The Neurontin  was not meant to be taken with Tramadol. The instructions were to follow up in 4 weeks to see if the Neurontin helps. She will need to be seen. Either here or at the pain clinic as we made a referral to them for chest wall pain. . She is 84 years old and oversedation is a huge risk.Thanks

## 2017-09-08 NOTE — Telephone Encounter (Signed)
Called and spoke with pt who is requesting a refill of her Tramadol 50mg  tablet.  Pt stated she was told by Kandice RobinsonsSarah Groce not to take the tramadol with the gabapentin, and she stated she has not been taking the two meds together but there is time in between the doses of gabapentin when she does need to take the Tramadol.  Sarah, please advise if you are okay with us sending a new script in for pt.  Thanks!

## 2017-09-09 NOTE — Telephone Encounter (Signed)
Called and spoke to patient. Patient stated that she has been seeing the pain clinic and has an upcoming appointment so she will follow up about pain. Patient shared that she feels good in the morning but that pain increased later in the day. Patient stated she had leftover tramadol and took a dose. Encouraged patient to not take anymore because of possible sedation and to please talk with the pain clinic. Scheduled 4 month ROV with Dr. McQuaid for Alexandra Fries5/15/2019 at 11:15. Patient stated that she didn't notice that she was supposed to make that 4 week appointment with [name] but that since it's past she will just follow up with BQ. Nothing further is needed at this time.

## 2017-09-10 ENCOUNTER — Ambulatory Visit: Payer: Medicare Other | Admitting: Internal Medicine

## 2017-09-12 ENCOUNTER — Telehealth: Payer: Self-pay | Admitting: *Deleted

## 2017-09-12 NOTE — Telephone Encounter (Signed)
Prior authorization submitted and approved for diclofenac sodium 1% gel 

## 2017-09-22 ENCOUNTER — Ambulatory Visit (HOSPITAL_BASED_OUTPATIENT_CLINIC_OR_DEPARTMENT_OTHER): Payer: Medicare Other | Admitting: Physical Medicine & Rehabilitation

## 2017-09-22 ENCOUNTER — Encounter: Payer: Self-pay | Admitting: Physical Medicine & Rehabilitation

## 2017-09-22 ENCOUNTER — Encounter: Payer: Medicare Other | Attending: Physical Medicine & Rehabilitation

## 2017-09-22 VITALS — BP 130/77 | HR 76 | Ht 63.0 in | Wt 137.4 lb

## 2017-09-22 DIAGNOSIS — R079 Chest pain, unspecified: Secondary | ICD-10-CM | POA: Diagnosis not present

## 2017-09-22 DIAGNOSIS — Z9889 Other specified postprocedural states: Secondary | ICD-10-CM | POA: Insufficient documentation

## 2017-09-22 DIAGNOSIS — G588 Other specified mononeuropathies: Secondary | ICD-10-CM | POA: Diagnosis not present

## 2017-09-22 DIAGNOSIS — M940 Chondrocostal junction syndrome [Tietze]: Secondary | ICD-10-CM

## 2017-09-22 DIAGNOSIS — M6281 Muscle weakness (generalized): Secondary | ICD-10-CM | POA: Insufficient documentation

## 2017-09-22 DIAGNOSIS — M792 Neuralgia and neuritis, unspecified: Secondary | ICD-10-CM | POA: Diagnosis not present

## 2017-09-22 MED ORDER — TRAMADOL HCL 50 MG PO TABS: 50.0000 mg | ORAL_TABLET | Freq: Two times a day (BID) | ORAL | 0 refills | Status: DC | PRN

## 2017-09-22 NOTE — Progress Notes (Addendum)
Subjective:    Patient ID: Alexandra Reyes, female    DOB: 04-May-1926, 84 y.o.   MRN: [id number] 84 yo female with >39yr hx of tachycardia who had an ablation that gave 4 mo relief of tachycardia but no relief of chest pain. Also had dx of costochondritis diagnosed by pulmonary and this was relieved with NSAID  No hx of trauma to chest wall or surgery such as thoracotomy , no hx shingles  Pain is relieved by raising arms overhead or by supporting breasts in a reclined position as well as leaning forward No pain at night, has pain mainly in mid afternoon Has tried various bras which have not been helpful in relieving pain  Pt has lost 20 lb , CT chest shows no tumour.   Gabapentin partially helpful but causes drowsiness  Seen by GI for clinical diagnosis no GERD, no endoscopy recommended  HPI  Would like me to prescribe Tramadol - only takes 1-2 tabs per wk Gabapentin 100mg  TID better than BID, no excess drowsiness Uses tylenol prn Using diclofenac gel QID Has not heard from PT Pain Inventory Average Pain 8 Pain Right Now 0 My pain is aching  In the last 24 hours, has pain interfered with the following? General activity 4 Relation with others 0 Enjoyment of life 3 What TIME of day is your pain at its worst? evening Sleep (in general) Good  Pain is worse with: some activites and yard work/gardening Pain improves with: rest and medication Relief from Meds: 8  Mobility walk without assistance do you drive?  yes  Function retired  Neuro/Psych No problems in this area  Prior Studies Any changes since last visit?  no  Physicians involved in your care Any changes since last visit?  no   Family History  Problem Relation Age of Onset  . Heart failure Mother   . Arthritis Mother   . Kidney failure Father   . Heart disease Father   . Hypertension Father    Social History   Socioeconomic History  . Marital status: Widowed    Spouse name: Not on file  .  Number of children: Y  . Years of education: 29  . Highest education level: Not on file  Occupational History  . Occupation: Retired Designer, jewellery  Social Needs  . Financial resource strain: Not on file  . Food insecurity:    Worry: Not on file    Inability: Not on file  . Transportation needs:    Medical: Not on file    Non-medical: Not on file  Tobacco Use  . Smoking status: Former Smoker    Packs/day: 1.50    Years: 36.00    Pack years: 54.00    Types: Cigarettes    Start date: 03/01/1959    Last attempt to quit: 06/10/1994    Years since quitting: 23.3  . Smokeless tobacco: Never Used  Substance and Sexual Activity  . Alcohol use: No    Frequency: Never  . Drug use: No  . Sexual activity: Not on file  Lifestyle  . Physical activity:    Days per week: Not on file    Minutes per session: Not on file  . Stress: Not on file  Relationships  . Social connections:    Talks on phone: Not on file    Gets together: Not on file    Attends religious service: Not on file    Active member of club or organization: Not on file  Attends meetings of clubs or organizations: Not on file    Relationship status: Not on file  Other Topics Concern  . Not on file  Social History Narrative   Originally from [place], [place]. She moved to [place] in 1950 during the polio epidemic. 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 Remote bird exposure. No mold or hot tub exposure.    Past Surgical History:  Procedure Laterality Date  . ABDOMINAL HYSTERECTOMY  1988  . BREAST BIOPSY Right 1948   "it was ok"  . CARDIAC CATHETERIZATION    . CARDIOVASCULAR STRESS TEST  03/08/2009   EF 84%  . CATARACT EXTRACTION W/ INTRAOCULAR LENS  IMPLANT, BILATERAL Bilateral   . INGUINAL HERNIA REPAIR Right 06/23/2017   Procedure: REPAIR INCARCERATED  RIGHT FEMORAL HERNIA WITH MESH;  Surgeon: Harriette Bouillon, MD;  Location: [facility] OR;  Service: General;  Laterality: Right;  . KNEE ARTHROSCOPY Right   . RIGHT/LEFT  HEART CATH AND CORONARY ANGIOGRAPHY N/A 07/25/2016   Procedure: Right/Left Heart Cath and Coronary Angiography;  Surgeon: Kathleene Hazel, MD;  Location: [facility] INVASIVE CV LAB;  Service: Cardiovascular;  Laterality: N/A;  . SVT ABLATION  09/19/2016  . SVT ABLATION N/A 09/19/2016   Procedure: SVT Ablation;  Surgeon: Hillis Range, MD;  Location: [facility] INVASIVE CV LAB;  Service: Cardiovascular;  Laterality: N/A;  . TONSILLECTOMY AND ADENOIDECTOMY    . US ECHOCARDIOGRAPHY  01/17/2003   EF 55-60%   Past Medical History:  Diagnosis Date  . Age-related macular degeneration, wet, both eyes (HCC)   . Alcohol dependence in remission (HCC) 05/22/2011  . Allergic rhinitis, cause unspecified 05/22/2011  . Allergy   . Anemia, iron deficiency 05/18/2011  . Cholelithiasis   . COPD (chronic obstructive pulmonary disease) (HCC)   . DDD (degenerative disc disease), lumbar 05/18/2011  . Depression    "@ times" (09/19/2016)  . First degree AV block   . GERD (gastroesophageal reflux disease)   . Hiatal hernia   . History of blood transfusion    "3 or 4 related to childbirth"  . HTN (hypertension) 05/18/2011   pt denies this hx on 09/19/2016  . Hyperlipidemia 05/18/2011  . Macular degeneration   . Myocardial infarction (HCC)    "EKG shows I've had 2; date unknown" (09/19/2016)  . OA (osteoarthritis)    "a little here and there; mainly in the back" (09/19/2016)  . Osteoporosis 05/18/2011  . PAT (paroxysmal atrial tachycardia) (HCC)   . Pectus excavatum 05/18/2011  . Personal history of colonic polyps 10/28/2012  . Pneumonia    "as a child"  . SVT (supraventricular tachycardia) (HCC)   . Urinary frequency    BP 130/77   Pulse 76   Ht 5\' 3"  (1.6 m) Comment: pt reported  Wt 137 lb 6.4 oz (62.3 kg)   LMP  (LMP Unknown)   SpO2 95%   BMI 24.34 kg/m   Opioid Risk Score:   Fall Risk Score:  `1  Depression screen PHQ 2/9  Depression screen [facility] 2/9 09/22/2017 08/26/2017 07/04/2017 12/25/2016 09/11/2016 08/02/2015  06/30/2013  Decreased Interest 0 0 0 0 0 0 0  Down, Depressed, Hopeless 0 0 1 1 0 0 0  PHQ - 2 Score 0 0 1 1 0 0 0  Altered sleeping - - - 1 - - -  Tired, decreased energy - - - 1 - - -  Change in appetite - - - 1 - - -  Feeling bad or failure about yourself  - - -  0 - - -  Trouble concentrating - - - 0 - - -  Moving slowly or fidgety/restless - - - 0 - - -  Suicidal thoughts - - - 0 - - -  PHQ-9 Score - - - 4 - - -  Difficult doing work/chores - - - Not difficult at all - - -   Review of Systems  Constitutional: Negative.   HENT: Negative.   Eyes: Negative.   Respiratory: Negative.   Cardiovascular: Negative.   Gastrointestinal: Negative.   Endocrine: Negative.   Genitourinary: Negative.   Musculoskeletal:       Rib pain  Skin: Negative.   Allergic/Immunologic: Negative.   Neurological: Negative.   Hematological: Negative.   Psychiatric/Behavioral: Negative.   All other systems reviewed and are negative.      Objective:   Physical Exam  Constitutional: She is oriented to person, place, and time. She appears well-developed and well-nourished. No distress.  HENT:  Head: Normocephalic and atraumatic.  Eyes: Pupils are equal, round, and reactive to light. Conjunctivae and EOM are normal.  Neck: Normal range of motion. Neck supple.  Pulmonary/Chest: She exhibits tenderness.  Neurological: She is alert and oriented to person, place, and time.  Skin: Skin is warm and dry. She is not diaphoretic.  Psychiatric: She has a normal mood and affect. Her behavior is normal. Judgment and thought content normal.  Nursing note and vitals reviewed.  Tenderness around the inferior aspect of the right 7th rib Tenderness at the sternal costal margin right greater than left      Assessment & Plan:  #1.  Costochondritis, continue Voltaren gel  2.  Intercostal neuralgia increase gabapentin 200 mg 4 times daily  3.  Chronic chest pain takes as needed tramadol 1-2 tablets/week.  30  tablets written  This may not be required once gabapentin dose is increased.  Also will resend PT order.  Since tramadol may not be a chronic medicine we will not sign controlled substance agreement at this point Return to clinic 6 weeks

## 2017-09-22 NOTE — Patient Instructions (Signed)
Costochondritis Costochondritis is swelling and irritation (inflammation) of the tissue (cartilage) that connects your ribs to your breastbone (sternum). This causes pain in the front of your chest. The pain usually starts gradually and involves more than one rib. What are the causes? The exact cause of this condition is not always known. It results from stress on the cartilage where your ribs attach to your sternum. The cause of this stress could be:  Chest injury (trauma).  Exercise or activity, such as lifting.  Severe coughing.  What increases the risk? You may be at higher risk for this condition if you:  Are female.  Are 30?82 years old.  Recently started a new exercise or work activity.  Have low levels of vitamin D.  Have a condition that makes you cough frequently.  What are the signs or symptoms? The main symptom of this condition is chest pain. The pain:  Usually starts gradually and can be sharp or dull.  Gets worse with deep breathing, coughing, or exercise.  Gets better with rest.  May be worse when you press on the sternum-rib connection (tenderness).  How is this diagnosed? This condition is diagnosed based on your symptoms, medical history, and a physical exam. Your health care provider will check for tenderness when pressing on your sternum. This is the most important finding. You may also have tests to rule out other causes of chest pain. These may include:  A chest X-ray to check for lung problems.  An electrocardiogram (ECG) to see if you have a heart problem that could be causing the pain.  An imaging scan to rule out a chest or rib fracture.  How is this treated? This condition usually goes away on its own over time. Your health care provider may prescribe an NSAID to reduce pain and inflammation. Your health care provider may also suggest that you:  Rest and avoid activities that make pain worse.  Apply heat or cold to the area to reduce pain  and inflammation.  Do exercises to stretch your chest muscles.  If these treatments do not help, your health care provider may inject a numbing medicine at the sternum-rib connection to help relieve the pain. Follow these instructions at home:  Avoid activities that make pain worse. This includes any activities that use chest, abdominal, and side muscles.  If directed, put ice on the painful area: ? Put ice in a plastic bag. ? Place a towel between your skin and the bag. ? Leave the ice on for 20 minutes, 2-3 times a day.  If directed, apply heat to the affected area as often as told by your health care provider. Use the heat source that your health care provider recommends, such as a moist heat pack or a heating pad. ? Place a towel between your skin and the heat source. ? Leave the heat on for 20-30 minutes. ? Remove the heat if your skin turns bright red. This is especially important if you are unable to feel pain, heat, or cold. You may have a greater risk of getting burned.  Take over-the-counter and prescription medicines only as told by your health care provider.  Return to your normal activities as told by your health care provider. Ask your health care provider what activities are safe for you.  Keep all follow-up visits as told by your health care provider. This is important. Contact a health care provider if:  You have chills or a fever.  Your pain does not go   away or it gets worse.  You have a cough that does not go away (is persistent). Get help right away if:  You have shortness of breath. This information is not intended to replace advice given to you by your health care provider. Make sure you discuss any questions you have with your health care provider. Document Released: 03/06/2005 Document Revised: 12/15/2015 Document Reviewed: 09/20/2015 Elsevier Interactive Patient Education  2018 [employer]   

## 2017-09-24 DIAGNOSIS — F4329 Adjustment disorder with other symptoms: Secondary | ICD-10-CM | POA: Diagnosis not present

## 2017-10-01 DIAGNOSIS — N816 Rectocele: Secondary | ICD-10-CM | POA: Diagnosis not present

## 2017-10-13 ENCOUNTER — Ambulatory Visit: Payer: Medicare Other | Attending: Physical Medicine & Rehabilitation

## 2017-10-13 DIAGNOSIS — R252 Cramp and spasm: Secondary | ICD-10-CM | POA: Diagnosis not present

## 2017-10-13 DIAGNOSIS — R293 Abnormal posture: Secondary | ICD-10-CM | POA: Diagnosis not present

## 2017-10-13 DIAGNOSIS — G588 Other specified mononeuropathies: Secondary | ICD-10-CM | POA: Diagnosis not present

## 2017-10-13 DIAGNOSIS — M940 Chondrocostal junction syndrome [Tietze]: Secondary | ICD-10-CM | POA: Insufficient documentation

## 2017-10-13 NOTE — Therapy (Signed)
[facility] Outpatient Rehabilitation [facility] [place] [address] [place], [place], [address] Phone: [phone]   Fax:  [phone]  Physical Therapy Treatment  Patient Details  Name: Alexandra Reyes MRN: [id number] Date of Birth: April 09, 1926 Referring Provider: Claudette Laws, MD   Encounter Date: 10/13/2017  PT End of Session - 10/13/17 1446    Visit Number  1    Number of Visits  8    Date for PT Re-Evaluation  11/07/17    Authorization Type  Medicare    PT Start Time  0252    PT Stop Time  0340    PT Time Calculation (min)  48 min    Activity Tolerance  Patient tolerated treatment well    Behavior During Therapy  [facility] for tasks assessed/performed       Past Medical History:  Diagnosis Date  . Age-related macular degeneration, wet, both eyes (HCC)   . Alcohol dependence in remission (HCC) 05/22/2011  . Allergic rhinitis, cause unspecified 05/22/2011  . Allergy   . Anemia, iron deficiency 05/18/2011  . Cholelithiasis   . COPD (chronic obstructive pulmonary disease) (HCC)   . DDD (degenerative disc disease), lumbar 05/18/2011  . Depression    "@ times" (09/19/2016)  . First degree AV block   . GERD (gastroesophageal reflux disease)   . Hiatal hernia   . History of blood transfusion    "3 or 4 related to childbirth"  . HTN (hypertension) 05/18/2011   pt denies this hx on 09/19/2016  . Hyperlipidemia 05/18/2011  . Macular degeneration   . Myocardial infarction (HCC)    "EKG shows I've had 2; date unknown" (09/19/2016)  . OA (osteoarthritis)    "a little here and there; mainly in the back" (09/19/2016)  . Osteoporosis 05/18/2011  . PAT (paroxysmal atrial tachycardia) (HCC)   . Pectus excavatum 05/18/2011  . Personal history of colonic polyps 10/28/2012  . Pneumonia    "as a child"  . SVT (supraventricular tachycardia) (HCC)   . Urinary frequency     Past Surgical History:  Procedure Laterality Date  . ABDOMINAL HYSTERECTOMY  1988  . BREAST BIOPSY Right  1948   "it was ok"  . CARDIAC CATHETERIZATION    . CARDIOVASCULAR STRESS TEST  03/08/2009   EF 84%  . CATARACT EXTRACTION W/ INTRAOCULAR LENS  IMPLANT, BILATERAL Bilateral   . INGUINAL HERNIA REPAIR Right 06/23/2017   Procedure: REPAIR INCARCERATED  RIGHT FEMORAL HERNIA WITH MESH;  Surgeon: Harriette Bouillon, MD;  Location: [facility] OR;  Service: General;  Laterality: Right;  . KNEE ARTHROSCOPY Right   . RIGHT/LEFT HEART CATH AND CORONARY ANGIOGRAPHY N/A 07/25/2016   Procedure: Right/Left Heart Cath and Coronary Angiography;  Surgeon: Kathleene Hazel, MD;  Location: [facility] INVASIVE CV LAB;  Service: Cardiovascular;  Laterality: N/A;  . SVT ABLATION  09/19/2016  . SVT ABLATION N/A 09/19/2016   Procedure: SVT Ablation;  Surgeon: Hillis Range, MD;  Location: [facility] INVASIVE CV LAB;  Service: Cardiovascular;  Laterality: N/A;  . TONSILLECTOMY AND ADENOIDECTOMY    . US ECHOCARDIOGRAPHY  01/17/2003   EF 55-60%    There were no vitals filed for this visit.  Subjective Assessment - 10/13/17 1453    Subjective  Previous costchondritis 40 years ago.   She has been treated this episode with meds without long term benefit.  Sent to PT for stretching.  Med dosing was increased  and doing some better now.     Limitations  -- unable to sit erect for  periods .    decr yard and cleaning activity.,       Patient Stated Goals  Decr pain so no meds for problem.      Currently in Pain?  Yes    Pain Score  4     Pain Location  Rib cage    Pain Orientation  Right;Lower    Pain Descriptors / Indicators  Sore    Pain Type  Chronic pain    Pain Radiating Towards  around chest wall    Pain Onset  More than a month ago    Pain Frequency  Intermittent    Aggravating Factors   activity in home    Pain Relieving Factors  sitting , change posture.          [facility] PT Assessment - 10/13/17 0001      Assessment   Medical Diagnosis  costchondritis    Referring Provider  Claudette Laws, MD    Onset Date/Surgical Date  --  2 years ago    Next MD Visit  11/10/17    Prior Therapy  No      Precautions   Precautions  None    Precaution Comments  osteoporosis      Restrictions   Weight Bearing Restrictions  No      Balance Screen   Has the patient fallen in the past 6 months  No    Has the patient had a decrease in activity level because of a fear of falling?   No    Is the patient reluctant to leave their home because of a fear of falling?   No      Prior Function   Level of Independence  Independent      Cognition   Overall Cognitive Status  Within Functional Limits for tasks assessed      Posture/Postural Control   Posture Comments  scoliosis at level of rib for anterior chest pain. s with Lt rot       ROM / Strength   AROM / PROM / Strength  AROM;Strength      AROM   AROM Assessment Site  Thoracic    Thoracic Flexion  35    Thoracic Extension  5    Thoracic - Right Rotation  25    Thoracic - Left Rotation  30      Palpation   Palpation comment  stiff in throacic spine.  tender anterior RT ribs and interconstals      Ambulation/Gait   Gait Comments  independent without device                   [facility] Adult PT Treatment/Exercise - 10/13/17 0001      Manual Therapy   Manual therapy comments  manual elevation of RT rib with deep breathing x 3             PT Education - 10/13/17 1555    Education provided  Yes    Education Details  POC , HEP , movement of ribs at spine    Person(s) Educated  Patient    Methods  Explanation;Demonstration;Tactile cues;Verbal cues;Handout    Comprehension  Returned demonstration;Verbalized understanding          PT Long Term Goals - 10/13/17 1448      PT LONG TERM GOAL #1   Title  She will be indpendent with all HEP issued    Time  4    Period  Weeks  Status  New      PT LONG TERM GOAL #2   Title  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  pain anterior RT chest . She demo scoliosis with LT rotation of spine at level of painful rib with decr mobility of that rib posterior.   Decr ROM thoracic spine.  , Tender along rib anterior to chest. No pain RT rib post session    Clinical Presentation  Stable    Rehab Potential  Good    PT Frequency  2x / week    PT Duration  4 weeks    PT Treatment/Interventions  Iontophoresis /ml Dexamethasone;Moist Heat;Therapeutic exercise;Manual techniques;Patient/family education    PT Next Visit Plan  Review stretching , add to HEP as needed. , heat if needed,  manual for spine and rib mobility    PT Home Exercise Plan  ball PA pressure, gentle rotation stretching     Consulted and Agree with Plan of Care  Patient       Patient will benefit from skilled therapeutic intervention in order to improve the following deficits and impairments:  Pain, Postural dysfunction, Decreased range of motion  Visit Diagnosis: Costochondritis  Intercostal neuralgia  Abnormal posture  Cramp and spasm     Problem List Patient Active Problem List   Diagnosis Date Noted  . Femoral hernia of right side with obstruction 06/23/2017  . CKD (chronic kidney disease), stage III (HCC) 03/12/2017  . Weight loss 11/27/2016  . SVT (supraventricular tachycardia) (HCC) 09/19/2016  . Left-sided nosebleed 09/11/2016  . Coronary artery disease involving native coronary artery of native heart without angina pectoris   . ILD (interstitial lung disease) (HCC) 05/22/2016  . Constipation 03/13/2016  . Bronchiectasis without acute exacerbation (HCC) 03/13/2016  . Chest pain 08/30/2015  . Dyspnea 08/02/2015  . Bilateral leg pain 02/01/2015  . Diastolic  dysfunction 02/01/2015  . Peripheral edema 02/01/2015  . Abnormal EKG 05/18/2014  . Syncope 05/18/2014  . Dyslipidemia 05/14/2013  . Dyspnea on exertion 05/14/2013  . DOE (dyspnea on exertion) 04/28/2013  . Personal history of colonic polyps 10/28/2012  . Osteopenia 04/29/2012  . Elevated digoxin level 04/29/2012  . Allergic rhinitis, cause unspecified 05/22/2011  . Alcohol dependence in remission (HCC) 05/22/2011  . Depression 05/22/2011  . Preventative health care 05/18/2011  . HTN (hypertension) 05/18/2011  . Hyperlipidemia 05/18/2011  . Anemia, iron deficiency 05/18/2011  . GERD (gastroesophageal reflux disease) 05/18/2011  . DDD (degenerative disc disease), lumbar 05/18/2011  . Pectus excavatum 05/18/2011  . Macular degeneration   . OA (osteoarthritis)   . Cholelithiasis   . COPD (chronic obstructive pulmonary disease) (HCC)   . Paroxysmal SVT (supraventricular tachycardia) (HCC) 02/07/2011    Caprice Red  PT 10/13/2017, 3:55 PM  [facility] Outpatient Rehabilitation [facility] [address] [place], [place], [address] Phone: [phone]   Fax:  [phone]  Name:  Alexandra Reyes MRN: [id number] Date of Birth: Nov 03, 1925

## 2017-10-13 NOTE — Patient Instructions (Signed)
USe of tennis ball with gentle pressure to rt thoracic spine 30-60 sec or to tolerance.  Cued to not put heavy pressure. And stop if painful.       Trunk rotation stretching RT/Lt only to start of tightness and stop if painful.   2-3 reps 2x/day

## 2017-10-15 ENCOUNTER — Encounter: Payer: Self-pay | Admitting: Physical Therapy

## 2017-10-15 ENCOUNTER — Ambulatory Visit: Payer: Medicare Other | Admitting: Physical Therapy

## 2017-10-15 DIAGNOSIS — M940 Chondrocostal junction syndrome [Tietze]: Secondary | ICD-10-CM | POA: Diagnosis not present

## 2017-10-15 DIAGNOSIS — R293 Abnormal posture: Secondary | ICD-10-CM | POA: Diagnosis not present

## 2017-10-15 DIAGNOSIS — R252 Cramp and spasm: Secondary | ICD-10-CM | POA: Diagnosis not present

## 2017-10-15 DIAGNOSIS — G588 Other specified mononeuropathies: Secondary | ICD-10-CM | POA: Diagnosis not present

## 2017-10-15 NOTE — Therapy (Signed)
[facility] Outpatient Rehabilitation [facility] [address] [place], [place], [address] Phone: [phone]   Fax:  [phone]  Physical Therapy Treatment  Patient Details  Name: Alexandra Reyes MRN: [id number] Date of Birth: 28-May-1926 Referring Provider: Claudette Laws, MD   Encounter Date: 10/15/2017  PT End of Session - 10/15/17 1234    Visit Number  2    Number of Visits  8    Date for PT Re-Evaluation  11/07/17    Authorization Type  Medicare    PT Start Time  1150    PT Stop Time  1229    PT Time Calculation (min)  39 min    Activity Tolerance  Patient tolerated treatment well    Behavior During Therapy  [facility] for tasks assessed/performed       Past Medical History:  Diagnosis Date  . Age-related macular degeneration, wet, both eyes (HCC)   . Alcohol dependence in remission (HCC) 05/22/2011  . Allergic rhinitis, cause unspecified 05/22/2011  . Allergy   . Anemia, iron deficiency 05/18/2011  . Cholelithiasis   . COPD (chronic obstructive pulmonary disease) (HCC)   . DDD (degenerative disc disease), lumbar 05/18/2011  . Depression    "@ times" (09/19/2016)  . First degree AV block   . GERD (gastroesophageal reflux disease)   . Hiatal hernia   . History of blood transfusion    "3 or 4 related to childbirth"  . HTN (hypertension) 05/18/2011   pt denies this hx on 09/19/2016  . Hyperlipidemia 05/18/2011  . Macular degeneration   . Myocardial infarction (HCC)    "EKG shows I've had 2; date unknown" (09/19/2016)  . OA (osteoarthritis)    "a little here and there; mainly in the back" (09/19/2016)  . Osteoporosis 05/18/2011  . PAT (paroxysmal atrial tachycardia) (HCC)   . Pectus excavatum 05/18/2011  . Personal history of colonic polyps 10/28/2012  . Pneumonia    "as a child"  . SVT (supraventricular tachycardia) (HCC)   . Urinary frequency     Past Surgical History:  Procedure Laterality Date  . ABDOMINAL HYSTERECTOMY  1988  . BREAST BIOPSY Right  1948   "it was ok"  . CARDIAC CATHETERIZATION    . CARDIOVASCULAR STRESS TEST  03/08/2009   EF 84%  . CATARACT EXTRACTION W/ INTRAOCULAR LENS  IMPLANT, BILATERAL Bilateral   . INGUINAL HERNIA REPAIR Right 06/23/2017   Procedure: REPAIR INCARCERATED  RIGHT FEMORAL HERNIA WITH MESH;  Surgeon: Harriette Bouillon, MD;  Location: [facility] OR;  Service: General;  Laterality: Right;  . KNEE ARTHROSCOPY Right   . RIGHT/LEFT HEART CATH AND CORONARY ANGIOGRAPHY N/A 07/25/2016   Procedure: Right/Left Heart Cath and Coronary Angiography;  Surgeon: Kathleene Hazel, MD;  Location: [facility] INVASIVE CV LAB;  Service: Cardiovascular;  Laterality: N/A;  . SVT ABLATION  09/19/2016  . SVT ABLATION N/A 09/19/2016   Procedure: SVT Ablation;  Surgeon: Hillis Range, MD;  Location: [facility] INVASIVE CV LAB;  Service: Cardiovascular;  Laterality: N/A;  . TONSILLECTOMY AND ADENOIDECTOMY    . US ECHOCARDIOGRAPHY  01/17/2003   EF 55-60%    There were no vitals filed for this visit.  Subjective Assessment - 10/15/17 1151    Subjective  My HEP is doing good, I haven't had any problems with it. My hip is bothering me today, it feels like its in my hip and I'm not sure what I did. Nothing else major going on.     Patient Stated Goals  Decr pain so  no meds for problem.      Currently in Pain?  Yes    Pain Score  3     Pain Location  Rib cage    Pain Orientation  Right;Lower    Pain Descriptors / Indicators  Pressure;Sore                       [facility] Adult PT Treatment/Exercise - 10/15/17 0001      Exercises   Exercises  Shoulder;Other Exercises      Shoulder Exercises: Supine   Other Supine Exercises  thoracic extension stretch over towel x3 minutes     Other Supine Exercises  B UE flexion over thoracic roll 1x10 for increased thoracic mobiliity       Shoulder Exercises: Seated   Other Seated Exercises  thoracic excursions 3D 1x15 each       Manual Therapy   Manual Therapy  Soft tissue mobilization    Manual  therapy comments  separate from all other skilled services     Soft tissue mobilization  STM R side of thoracic paraspinals              PT Education - 10/15/17 1234    Education provided  Yes    Education Details  goal review, purpose and form for all exercises during session     Person(s) Educated  Patient    Methods  Explanation    Comprehension  Verbalized understanding;Need further instruction          PT Long Term Goals - 10/13/17 1448      PT LONG TERM GOAL #1   Title  She will be indpendent with all HEP issued    Time  4    Period  Weeks    Status  New      PT LONG TERM GOAL #2   Title  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 Focused on thoracic mobility exercises and stretches today, performed manual to thoracic spine as time allowed otherwise today. Patient reports she is comfortable with her current HEP, did not provide updates today due to time limitations. Pain 0/10 at EOS.     Rehab Potential  Good    PT Frequency  2x / week    PT Duration  4 weeks    PT Treatment/Interventions  Iontophoresis /ml Dexamethasone;Moist Heat;Therapeutic exercise;Manual techniques;Patient/family education    PT Next Visit Plan  Review stretching , add to HEP as needed. , heat if needed,  manual for spine and rib mobility. continue thoracic mobility.     PT Home Exercise Plan  ball PA pressure, gentle rotation stretching     Consulted and Agree with Plan of Care  Patient       Patient will benefit from skilled therapeutic intervention in  order to  improve the following deficits and impairments:  Pain, Postural dysfunction, Decreased range of motion  Visit Diagnosis: Abnormal posture  Cramp and spasm     Problem List Patient Active Problem List   Diagnosis Date Noted  . Femoral hernia of right side with obstruction 06/23/2017  . CKD (chronic kidney disease), stage III (HCC) 03/12/2017  . Weight loss 11/27/2016  . SVT (supraventricular tachycardia) (HCC) 09/19/2016  . Left-sided nosebleed 09/11/2016  . Coronary artery disease involving native coronary artery of native heart without angina pectoris   . ILD (interstitial lung disease) (HCC) 05/22/2016  . Constipation 03/13/2016  . Bronchiectasis without acute exacerbation (HCC) 03/13/2016  . Chest pain 08/30/2015  . Dyspnea 08/02/2015  . Bilateral leg pain 02/01/2015  . Diastolic dysfunction 02/01/2015  . Peripheral edema 02/01/2015  . Abnormal EKG 05/18/2014  . Syncope 05/18/2014  . Dyslipidemia 05/14/2013  . Dyspnea on exertion 05/14/2013  . DOE (dyspnea on exertion) 04/28/2013  . Personal history of colonic polyps 10/28/2012  . Osteopenia 04/29/2012  . Elevated digoxin level 04/29/2012  . Allergic rhinitis, cause unspecified 05/22/2011  . Alcohol dependence in remission (HCC) 05/22/2011  . Depression 05/22/2011  . Preventative health care 05/18/2011  . HTN (hypertension) 05/18/2011  . Hyperlipidemia 05/18/2011  . Anemia, iron deficiency 05/18/2011  . GERD (gastroesophageal reflux disease) 05/18/2011  . DDD (degenerative disc disease), lumbar 05/18/2011  . Pectus excavatum 05/18/2011  . Macular degeneration   . OA (osteoarthritis)   . Cholelithiasis   . COPD (chronic obstructive pulmonary disease) (HCC)   . Paroxysmal SVT (supraventricular tachycardia) (HCC) 02/07/2011    Nedra Hai PT, DPT, CBIS  Supplemental Physical Therapist [facility]   Pager [phone]   [facility] Outpatient Rehabilitation [facility] [address] [place], [place], [address] Phone: [phone]   Fax:  [phone]  Name: Alexandra Reyes MRN: [id number] Date of Birth: 1925-09-16

## 2017-10-22 ENCOUNTER — Ambulatory Visit (INDEPENDENT_AMBULATORY_CARE_PROVIDER_SITE_OTHER): Payer: Medicare Other | Admitting: Pulmonary Disease

## 2017-10-22 ENCOUNTER — Encounter: Payer: Self-pay | Admitting: Pulmonary Disease

## 2017-10-22 VITALS — BP 110/60 | HR 67 | Ht 63.0 in | Wt 136.8 lb

## 2017-10-22 DIAGNOSIS — J449 Chronic obstructive pulmonary disease, unspecified: Secondary | ICD-10-CM | POA: Diagnosis not present

## 2017-10-22 DIAGNOSIS — J849 Interstitial pulmonary disease, unspecified: Secondary | ICD-10-CM

## 2017-10-22 DIAGNOSIS — J301 Allergic rhinitis due to pollen: Secondary | ICD-10-CM

## 2017-10-22 DIAGNOSIS — R079 Chest pain, unspecified: Secondary | ICD-10-CM | POA: Diagnosis not present

## 2017-10-22 DIAGNOSIS — K219 Gastro-esophageal reflux disease without esophagitis: Secondary | ICD-10-CM | POA: Diagnosis not present

## 2017-10-22 MED ORDER — TRAMADOL HCL 50 MG PO TABS: 50.0000 mg | ORAL_TABLET | Freq: Two times a day (BID) | ORAL | 0 refills | Status: DC | PRN

## 2017-10-22 NOTE — Progress Notes (Signed)
Synopsis: Former patient of Dr. Ashok Cordia with ILD, COPD, GERD, allergic rhinitis  Subjective:   PATIENT ID: Alexandra Reyes GENDER: female DOB: 13-Nov-1925, MRN: [id number]   HPI  Chief Complaint  Patient presents with  . Follow-up    Former [facility] patient, COPD, [place] had emergent hernia surgery in January.  She has recovered from that. She says that she takes Symbicort, one puff twice a day.  Taking 2 puffs made her shaky. She has dealt with costochondritis over the years.  She says that she used prednisone for this. She says that sometimes she would have tachycardia which was associated with some chest soreness. She says she was seen for GERD by Dr. Henrene Pastor who says that she did not have GERD. Dr. Ashok Cordia recommended that she be seen by a pain management specialist for her chronic R chest pain which Dr. Ashok Cordia attributed to "nerve pain".  Eventually she was started on neurontin 167m bid.  She has also been prescribed tramadol as well.  She did see Dr. KElla Bodowith pain management.   She coughs up thick mucus in the mornings.  She says this is coming from her throat and is associated with hoarseness.  She has some sinus congestion and a post nasal drip.  She takes an OTC antihistamine.    Past Medical History:  Diagnosis Date  . Age-related macular degeneration, wet, both eyes ([facility]   . Alcohol dependence in remission ([facility] 05/22/2011  . Allergic rhinitis, cause unspecified 05/22/2011  . Allergy   . Anemia, iron deficiency 05/18/2011  . Cholelithiasis   . COPD (chronic obstructive pulmonary disease) ([facility]   . DDD (degenerative disc disease), lumbar 05/18/2011  . Depression    "@ times" (09/19/2016)  . First degree AV block   . GERD (gastroesophageal reflux disease)   . Hiatal hernia   . History of blood transfusion    "3 or 4 related to childbirth"  . HTN (hypertension) 05/18/2011   pt denies this hx on 09/19/2016  . Hyperlipidemia 05/18/2011  . Macular degeneration   .  Myocardial infarction ([facility]    "EKG shows I've had 2; date unknown" (09/19/2016)  . OA (osteoarthritis)    "a little here and there; mainly in the back" (09/19/2016)  . Osteoporosis 05/18/2011  . PAT (paroxysmal atrial tachycardia) ([facility]   . Pectus excavatum 05/18/2011  . Personal history of colonic polyps 10/28/2012  . Pneumonia    "as a child"  . SVT (supraventricular tachycardia) ([facility]   . Urinary frequency      Family History  Problem Relation Age of Onset  . Heart failure Mother   . Arthritis Mother   . Kidney failure Father   . Heart disease Father   . Hypertension Father      Social History   Socioeconomic History  . Marital status: Widowed    Spouse name: Not on file  . Number of children: Y  . Years of education: 145 . Highest education level: Not on file  Occupational History  . Occupation: Retired REquities trader Social Needs  . Financial resource strain: Not on file  . Food insecurity:    Worry: Not on file    Inability: Not on file  . Transportation needs:    Medical: Not on file    Non-medical: Not on file  Tobacco Use  . Smoking status: Former Smoker    Packs/day: 1.50    Years: 36.00    Pack years: 54.00  Types: Cigarettes    Start date: 03/01/1959    Last attempt to quit: 06/10/1994    Years since quitting: 23.3  . Smokeless tobacco: Never Used  Substance and Sexual Activity  . Alcohol use: No    Frequency: Never  . Drug use: No  . Sexual activity: Not on file  Lifestyle  . Physical activity:    Days per week: Not on file    Minutes per session: Not on file  . Stress: Not on file  Relationships  . Social connections:    Talks on phone: Not on file    Gets together: Not on file    Attends religious service: Not on file    Active member of club or organization: Not on file    Attends meetings of clubs or organizations: Not on file    Relationship status: Not on file  . Intimate partner violence:    Fear of current or ex partner: Not on  file    Emotionally abused: Not on file    Physically abused: Not on file    Forced sexual activity: Not on file  Other Topics Concern  . Not on file  Social History Narrative   Originally from [place], [place]. She moved to [place] in 1950 during the polio epidemic. Previously worked as a Marine scientist. No pets currently. Remote bird exposure. No mold or hot tub exposure.      Allergies  Allergen Reactions  . Symbicort [Budesonide-Formoterol Fumarate]     Jittery in high doses, this has been reduced and is tolerated at this time.      Outpatient Medications Prior to Visit  Medication Sig Dispense Refill  . acetaminophen (TYLENOL) 500 MG tablet Take 1,000 mg by mouth 2 (two) times daily as needed for moderate pain or headache.    . ALPRAZolam (XANAX) 0.25 MG tablet TAKE ONE TABLET BY MOUTH AT BEDTIME AS NEEDED FOR ANXIETY 90 tablet 1  . amLODipine (NORVASC) 5 MG tablet Take 1 tablet (5 mg total) by mouth daily. 90 tablet 3  . Ascorbic Acid (VITAMIN C PO) Take 1 tablet by mouth daily. Reported on 08/08/2015    . aspirin 81 MG tablet Take 81 mg by mouth at bedtime.     . B Complex Vitamins (VITAMIN B COMPLEX PO) Take 1 tablet by mouth daily.     . calcium carbonate (OS-CAL) 600 MG tablet Take 600 mg by mouth daily.    . diclofenac sodium (VOLTAREN) 1 % GEL Apply 2 g topically 4 (four) times daily. 3 Tube 2  . diltiazem (CARDIZEM CD) 120 MG 24 hr capsule Take 120 mg by mouth daily.    Marland Kitchen diltiazem (CARDIZEM) 30 MG tablet Take 1 tablet (30 mg total) by mouth 4 (four) times daily as needed. 120 tablet 11  . docusate sodium (COLACE) 100 MG capsule Take 100 mg by mouth daily.     . furosemide (LASIX) 40 MG tablet TAKE ONE TABLET BY MOUTH DAILY 90 tablet 3  . gabapentin (NEURONTIN) 100 MG capsule Take 1 capsule (100 mg total) by mouth 3 (three) times daily. 90 capsule 3  . Glucosamine HCl-MSM (GLUCOSAMINE-MSM PO) Take 2 tablets by mouth daily.     . methocarbamol (ROBAXIN) 500 MG tablet Take 1 tablet (500 mg  total) by mouth every 6 (six) hours as needed for muscle spasms. 30 tablet 0  . nitroGLYCERIN (NITROSTAT) 0.4 MG SL tablet Place 1 tablet (0.4 mg total) under the tongue every 5 (five) minutes  as needed for chest pain. 25 tablet 3  . omeprazole (PRILOSEC) 40 MG capsule TAKE ONE CAPSULE BY MOUTH DAILY 30 capsule 3  . PRESCRIPTION MEDICATION Apply 1 Dose to eye every 3 (three) months. Eylea - eye injection    . simvastatin (ZOCOR) 10 MG tablet TAKE ONE TABLET BY MOUTH EVERY EVENING AT 6:00 PM 90 tablet 2  . SYMBICORT 80-4.5 MCG/ACT inhaler INHALE 1 PUFF INTO THE LUNGS TWICE A DAY AS DIRECTED 1 Inhaler 5  . tobramycin (TOBREX) 0.3 % ophthalmic solution Place 1 drop into the right eye 4 (four) times daily. Use the day before, the day of, and the day after eye injections    . triamcinolone cream (KENALOG) 0.1 % Apply 1 application topically daily as needed (hives). Apply to affected area     . vitamin E 200 UNIT capsule Take 200 Units by mouth daily.    . traMADol (ULTRAM) 50 MG tablet Take 1 tablet (50 mg total) by mouth every 12 (twelve) hours as needed. 30 tablet 0   No facility-administered medications prior to visit.     Review of Systems  Constitutional: Positive for malaise/fatigue. Negative for diaphoresis.  HENT: Positive for congestion. Negative for nosebleeds and sore throat.   Respiratory: Positive for cough and sputum production. Negative for shortness of breath and wheezing.   Cardiovascular: Positive for chest pain. Negative for claudication and leg swelling.      Objective:  Physical Exam   Vitals:   10/22/17 1128  BP: 110/60  Pulse: 67  SpO2: 97%  Weight: 136 lb 12.8 oz (62.1 kg)  Height: _0  (1.6 m)   RA  Gen: well appearing HENT: OP clear, TM's clear, neck supple PULM: CTA B, normal percussion CV: RRR, no mgr, trace edema GI: BS+, soft, nontender Derm: no cyanosis or rash Psyche: normal mood and affect   CBC    Component Value Date/Time   WBC 14.6 (H)  06/25/2017 1034   RBC 4.77 06/25/2017 1034   HGB 14.3 06/25/2017 1034   HGB 13.9 07/17/2016 1029   HCT 42.1 06/25/2017 1034   HCT 41.7 07/17/2016 1029   PLT 337 06/25/2017 1034   PLT 387 (H) 07/17/2016 1029   MCV 88.3 06/25/2017 1034   MCV 89 07/17/2016 1029   MCH 30.0 06/25/2017 1034   MCHC 34.0 06/25/2017 1034   RDW 14.3 06/25/2017 1034   RDW 14.7 07/17/2016 1029   LYMPHSABS 0.8 06/23/2017 1550   LYMPHSABS 1.3 07/17/2016 1029   MONOABS 0.7 06/23/2017 1550   EOSABS 0.0 06/23/2017 1550   EOSABS 0.2 07/17/2016 1029   BASOSABS 0.0 06/23/2017 1550   BASOSABS 0.1 07/17/2016 1029     PFT 07/12/15: FVC 2.29 L (106%) FEV1 1.57 L (99%) FEV1/FVC 0.68 FEF 25-75 0.78 L (81%) no bronchodilator response TLC 3.59 L (73%) RV 52% ERV 342% DLCO uncorrected 52% 05/14/13: FVC 2.34 L (103%) FEV1 1.63 L (97%) FEV1/FVC 0.69 FEF 25-75 0.83 L (76%) no bronchodilator response TLC 4.51 L (92%) ERV 284% DLCO uncorrected 55%  IMAGING CT CHEST W/O 04/03/16: Images independently reviewed showing some intralobular septal thickening and intralobular nodules in the bases predominantly, no other reticular change, no honeycombing  CT CHEST W/O 10/09/15 :  No pneumothorax or effusion. Minimal posterior subsegmental atelectasis. No appreciable bronchiectasis, mass, or nodule. No pathologic mediastinal adenopathy. No pericardial effusion. Mild coronary artery calcification.  BARIUM SWALLOW (08/11/15): No evidence for stricture or mass lesion. Moderate esophageal dysmotility without hiatal hernia. Small amount spontaneous reflux  into the lower third of the esophagus.  CTA CHEST 08/03/15: Mild lower lobe predominant atelectasis. No other nodule or opacity appreciated. No ulnar emboli. No pleural effusion or thickening. No pericardial effusion. No pathologic mediastinal adenopathy.  CARDIAC LHC (07/25/16):  Prox RCA lesion, 100 %stenosed.  Prox LAD lesion, 20 %stenosed.  LV end diastolic pressure is normal.   1.  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 The mid and distal vessel fills briskly from left to right collaterals.  2. Mild non-obstructive disease in the LAD 3. Normal filling pressures.  4. N specific ormal PA pressures  TTE (07/07/15): Narrow LVOT with normal cavity size. Mild LVH. EF 55-60%. Grade 1 diastolic dysfunction. LA & RA normal in size. RV normal in size and function. No aortic regurgitation. No mitral stenosis or regurgitation. No pulmonic regurgitation. Mild tricuspid regurgitation. No pericardial effusion.  LABS 04/17/16 CRP:  0.2 ESR:  19 ANA:  Negative Anti-CCP:  <16 RF:  <14 SCL-70:  <1.0 Hypersensitivity Pneumonitis Panel:  Negative   08/02/15 CBC: 9.2/14.4/42.7/390 BMP: 133/4.3/98/28/17/1.09/94/9.5 LFT: 4.2/7.0/0.5/62/18/16      Assessment & Plan:   Chronic obstructive pulmonary disease, unspecified COPD type ([place])  Gastroesophageal reflux disease, esophagitis presence not specified  Chest pain, unspecified type  ILD (interstitial lung disease) ([place])  Allergic rhinitis due to pollen, unspecified seasonality  Discussion: This has been a stable interval for her.  She does have poorly controlled allergic rhinitis and is not really taking anything for that right now.  She says that this is causing her to cough up some mucus in the mornings. She continues to complain of right-sided chest pain which is being managed by the pain management team.  She is requesting a refill on the tramadol which has previously been used, I will give her one prescription with 0 refills and then she can discuss this further with the pain management team. Asthma has been stable, she takes an unorthodox dose of Symbicort with 1 puff twice a day, that can continue for now. Is not clear to me what is causing the interstitial lung disease but it does not seem to have progressed.  There is no findings on physical exam to suggest that it is worse nor symptoms to suggest it so I will forego  any further testing at this time unless she gets worse.  Plan: Interstitial lung disease: You have some mild, poorly understood scarring in the bases of your lungs.  This does not seem to have progressed in the last 2 years.  If you have worsening shortness of breath we will assess this further with a lung function test.  Next line  COPD: Continue Symbicort 1 puff twice a day Use albuterol as needed for chest tightness wheezing or shortness of breath  Chest pain: Continue Neurontin as directed by the pain management clinic I will refill tramadol 1 time, no refills, you need to discuss whether or not you should continue taking this with the pain management doctors  Allergic rhinitis: Use Milta Deiters Med rinses with distilled water at least twice per day using the instructions on the package. 1/2 hour after using the [facility] Med rinse, use Nasacort two puffs in each nostril once per day.  Remember that the Nasacort can take 1-2 weeks to work after regular use. Use generic zyrtec (cetirizine) every day.  If this doesn't help, then stop taking it and use chlorpheniramine-phenylephrine combination tablets.  Follow up in 6 months or sooner if needed  25 minutes spent with patient  Current Outpatient Medications:  .  acetaminophen (TYLENOL) 500 MG tablet, Take 1,000 mg by mouth 2 (two) times daily as needed for moderate pain or headache., Disp: , Rfl:  .  ALPRAZolam (XANAX) 0.25 MG tablet, TAKE ONE TABLET BY MOUTH AT BEDTIME AS NEEDED FOR ANXIETY, Disp: 90 tablet, Rfl: 1 .  amLODipine (NORVASC) 5 MG tablet, Take 1 tablet (5 mg total) by mouth daily., Disp: 90 tablet, Rfl: 3 .  Ascorbic Acid (VITAMIN C PO), Take 1 tablet by mouth daily. Reported on 08/08/2015, Disp: , Rfl:  .  aspirin 81 MG tablet, Take 81 mg by mouth at bedtime. , Disp: , Rfl:  .  B Complex Vitamins (VITAMIN B COMPLEX PO), Take 1 tablet by mouth daily. , Disp: , Rfl:  .  calcium carbonate (OS-CAL) 600 MG tablet, Take 600 mg by mouth  daily., Disp: , Rfl:  .  diclofenac sodium (VOLTAREN) 1 % GEL, Apply 2 g topically 4 (four) times daily., Disp: 3 Tube, Rfl: 2 .  diltiazem (CARDIZEM CD) 120 MG 24 hr capsule, Take 120 mg by mouth daily., Disp: , Rfl:  .  diltiazem (CARDIZEM) 30 MG tablet, Take 1 tablet (30 mg total) by mouth 4 (four) times daily as needed., Disp: 120 tablet, Rfl: 11 .  docusate sodium (COLACE) 100 MG capsule, Take 100 mg by mouth daily. , Disp: , Rfl:  .  furosemide (LASIX) 40 MG tablet, TAKE ONE TABLET BY MOUTH DAILY, Disp: 90 tablet, Rfl: 3 .  gabapentin (NEURONTIN) 100 MG capsule, Take 1 capsule (100 mg total) by mouth 3 (three) times daily., Disp: 90 capsule, Rfl: 3 .  Glucosamine HCl-MSM (GLUCOSAMINE-MSM PO), Take 2 tablets by mouth daily. , Disp: , Rfl:  .  methocarbamol (ROBAXIN) 500 MG tablet, Take 1 tablet (500 mg total) by mouth every 6 (six) hours as needed for muscle spasms., Disp: 30 tablet, Rfl: 0 .  nitroGLYCERIN (NITROSTAT) 0.4 MG SL tablet, Place 1 tablet (0.4 mg total) under the tongue every 5 (five) minutes as needed for chest pain., Disp: 25 tablet, Rfl: 3 .  omeprazole (PRILOSEC) 40 MG capsule, TAKE ONE CAPSULE BY MOUTH DAILY, Disp: 30 capsule, Rfl: 3 .  PRESCRIPTION MEDICATION, Apply 1 Dose to eye every 3 (three) months. Eylea - eye injection, Disp: , Rfl:  .  simvastatin (ZOCOR) 10 MG tablet, TAKE ONE TABLET BY MOUTH EVERY EVENING AT 6:00 PM, Disp: 90 tablet, Rfl: 2 .  SYMBICORT 80-4.5 MCG/ACT inhaler, INHALE 1 PUFF INTO THE LUNGS TWICE A DAY AS DIRECTED, Disp: 1 Inhaler, Rfl: 5 .  tobramycin (TOBREX) 0.3 % ophthalmic solution, Place 1 drop into the right eye 4 (four) times daily. Use the day before, the day of, and the day after eye injections, Disp: , Rfl:  .  traMADol (ULTRAM) 50 MG tablet, Take 1 tablet (50 mg total) by mouth every 12 (twelve) hours as needed., Disp: 30 tablet, Rfl: 0 .  triamcinolone cream (KENALOG) 0.1 %, Apply 1 application topically daily as needed (hives). Apply to  affected area , Disp: , Rfl:  .  vitamin E 200 UNIT capsule, Take 200 Units by mouth daily., Disp: , Rfl:

## 2017-10-22 NOTE — Patient Instructions (Signed)
Interstitial lung disease: You have some mild, poorly understood scarring in the bases of your lungs.  This does not seem to have progressed in the last 2 years.  If you have worsening shortness of breath we will assess this further with a lung function test.  Next line  COPD: Continue Symbicort 1 puff twice a day Use albuterol as needed for chest tightness wheezing or shortness of breath  Chest pain: Continue Neurontin as directed by the pain management clinic I will refill tramadol 1 time, no refills, you need to discuss whether or not you should continue taking this with the pain management doctors  Allergic rhinitis: Use Lloyd Huger Med rinses with distilled water at least twice per day using the instructions on the package. 1/2 hour after using the [facility] Med rinse, use Nasacort two puffs in each nostril once per day.  Remember that the Nasacort can take 1-2 weeks to work after regular use. Use generic zyrtec (cetirizine) every day.  If this doesn't help, then stop taking it and use chlorpheniramine-phenylephrine combination tablets.  Follow up in 6 months or sooner if needed

## 2017-10-23 ENCOUNTER — Ambulatory Visit: Payer: Medicare Other | Admitting: Physical Therapy

## 2017-10-23 DIAGNOSIS — G588 Other specified mononeuropathies: Secondary | ICD-10-CM

## 2017-10-23 DIAGNOSIS — R252 Cramp and spasm: Secondary | ICD-10-CM

## 2017-10-23 DIAGNOSIS — M940 Chondrocostal junction syndrome [Tietze]: Secondary | ICD-10-CM | POA: Diagnosis not present

## 2017-10-23 DIAGNOSIS — R293 Abnormal posture: Secondary | ICD-10-CM | POA: Diagnosis not present

## 2017-10-23 NOTE — Therapy (Signed)
[facility] [address] [place], [place], [address] Phone: [phone]   Fax:  [phone]  Physical Therapy Treatment  Patient Details  Name: Alexandra Reyes MRN: [id number] Date of Birth: [birth year] Referring Provider: Claudette Laws, MD   Encounter Date: 10/23/2017  PT End of Session - 10/23/17 1548    Visit Number  3    Number of Visits  8    Date for PT Re-Evaluation  11/07/17    Authorization Type  Medicare    PT Start Time  1500    PT Stop Time  1556    PT Time Calculation (min)  56 min    Activity Tolerance  Patient tolerated treatment well    Behavior During Therapy  [facility] for tasks assessed/performed       Past Medical History:  Diagnosis Date  . Age-related macular degeneration, wet, both eyes (HCC)   . Alcohol dependence in remission (HCC) 05/22/2011  . Allergic rhinitis, cause unspecified 05/22/2011  . Allergy   . Anemia, iron deficiency 05/18/2011  . Cholelithiasis   . COPD (chronic obstructive pulmonary disease) (HCC)   . DDD (degenerative disc disease), lumbar 05/18/2011  . Depression    "@ times" (09/19/2016)  . First degree AV block   . GERD (gastroesophageal reflux disease)   . Hiatal hernia   . History of blood transfusion    "3 or 4 related to childbirth"  . HTN (hypertension) 05/18/2011   pt denies this hx on 09/19/2016  . Hyperlipidemia 05/18/2011  . Macular degeneration   . Myocardial infarction (HCC)    "EKG shows I've had 2; date unknown" (09/19/2016)  . OA (osteoarthritis)    "a little here and there; mainly in the back" (09/19/2016)  . Osteoporosis 05/18/2011  . PAT (paroxysmal atrial tachycardia) (HCC)   . Pectus excavatum 05/18/2011  . Personal history of colonic polyps 10/28/2012  . Pneumonia    "as a child"  . SVT (supraventricular tachycardia) (HCC)   . Urinary frequency     Past Surgical History:  Procedure Laterality Date  . ABDOMINAL HYSTERECTOMY  1988  . BREAST BIOPSY Right  1948   "it was ok"  . CARDIAC CATHETERIZATION    . CARDIOVASCULAR STRESS TEST  03/08/2009   EF 84%  . CATARACT EXTRACTION W/ INTRAOCULAR LENS  IMPLANT, BILATERAL Bilateral   . INGUINAL HERNIA REPAIR Right 06/23/2017   Procedure: REPAIR INCARCERATED  RIGHT FEMORAL HERNIA WITH MESH;  Surgeon: Harriette Bouillon, MD;  Location: [facility] OR;  Service: General;  Laterality: Right;  . KNEE ARTHROSCOPY Right   . RIGHT/LEFT HEART CATH AND CORONARY ANGIOGRAPHY N/A 07/25/2016   Procedure: Right/Left Heart Cath and Coronary Angiography;  Surgeon: Kathleene Hazel, MD;  Location: [facility] INVASIVE CV LAB;  Service: Cardiovascular;  Laterality: N/A;  . SVT ABLATION  09/19/2016  . SVT ABLATION N/A 09/19/2016   Procedure: SVT Ablation;  Surgeon: Hillis Range, MD;  Location: [facility] INVASIVE CV LAB;  Service: Cardiovascular;  Laterality: N/A;  . TONSILLECTOMY AND ADENOIDECTOMY    . US ECHOCARDIOGRAPHY  01/17/2003   EF 55-60%    There were no vitals filed for this visit.  Subjective Assessment - 10/23/17 1507    Subjective  I worked in the yard last week and I did something stupid.  I washed my house with a long bamboo stick, got the  leaves off.  Pt with low back piain on R side.     Currently in Pain?  Yes  Pain Score  2     Pain Location  Rib cage    Pain Score  8    Pain Location  Back    Pain Orientation  Right;Lower           [facility] Adult PT Treatment/Exercise - 10/23/17 0001      Lumbar Exercises: Stretches   Single Knee to Chest Stretch  2 reps;30 seconds    Lower Trunk Rotation  10 seconds    Lower Trunk Rotation Limitations  x 10     Other Lumbar Stretch Exercise  upper trunk rotation x 5 each side, encouraged deep breathing       Knee/Hip Exercises: Stretches   Piriformis Stretch  Both;2 reps;30 seconds    Piriformis Stretch Limitations  seated, felt stretch but not where she complains of pain       Shoulder Exercises: Supine   Other Supine Exercises  B UE flexion  2 x10 for increased thoracic  mobiliity  encouraged deep breathing       Manual Therapy   Manual Therapy  Scapular mobilization    Soft tissue mobilization  Rt serratus, lateral  trunk , intercostals, MFR     Scapular Mobilization  Rt. 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  Incorporated into treatment as able.  Did have relief of Rt sided rib pain post session.  Had not been doing any stretches prior to today.      PT Treatment/Interventions  Iontophoresis /ml Dexamethasone;Moist Heat;Therapeutic exercise;Manual techniques;Patient/family education    PT Next Visit Plan  Review stretching , add to HEP as needed. , heat if needed,  manual for spine and rib mobility. continue thoracic mobility.     PT Home Exercise Plan  ball PA pressure, gentle rotation stretching , upper and lower  and shoulder flexion with wand     Consulted and Agree with Plan of Care  Patient       Patient will benefit from skilled therapeutic intervention in order to improve the  following deficits and impairments:  Pain,  Postural dysfunction, Decreased range of motion  Visit Diagnosis: Abnormal posture  Cramp and spasm  Costochondritis  Intercostal neuralgia     Problem List Patient Active Problem List   Diagnosis Date Noted  . Femoral hernia of right side with obstruction 06/23/2017  . CKD (chronic kidney disease), stage III (HCC) 03/12/2017  . Weight loss 11/27/2016  . SVT (supraventricular tachycardia) (HCC) 09/19/2016  . Left-sided nosebleed 09/11/2016  . Coronary artery disease involving native coronary artery of native heart without angina pectoris   . ILD (interstitial lung disease) (HCC) 05/22/2016  . Constipation 03/13/2016  . Bronchiectasis without acute exacerbation (HCC) 03/13/2016  . Chest pain 08/30/2015  . Dyspnea 08/02/2015  . Bilateral leg pain 02/01/2015  . Diastolic dysfunction 02/01/2015  . Peripheral edema 02/01/2015  . Abnormal EKG 05/18/2014  . Syncope 05/18/2014  . Dyslipidemia 05/14/2013  . Dyspnea on exertion 05/14/2013  . DOE (dyspnea on exertion) 04/28/2013  . Personal history of colonic polyps 10/28/2012  . Osteopenia 04/29/2012  . Elevated digoxin level 04/29/2012  . Allergic rhinitis, cause unspecified 05/22/2011  . Alcohol dependence in remission (HCC) 05/22/2011  . Depression 05/22/2011  . Preventative health care 05/18/2011  . HTN (hypertension) 05/18/2011  . Hyperlipidemia 05/18/2011  . Anemia, iron deficiency 05/18/2011  . GERD (gastroesophageal reflux disease) 05/18/2011  . DDD (degenerative disc disease), lumbar 05/18/2011  . Pectus excavatum 05/18/2011  . Macular degeneration   . OA (osteoarthritis)   . Cholelithiasis   . COPD (chronic obstructive pulmonary disease) (HCC)   . Paroxysmal SVT (supraventricular tachycardia) (HCC) 02/07/2011    Alexandra Reyes 10/23/2017, 4:34 PM  [facility] [address] [place], [place], [address] Phone:  [phone]   Fax:  [phone]  Name: Alexandra Reyes MRN: [id number] Date of Birth: 1925/12/26  Karie Mainland, PT 10/23/17 4:35 PM Phone: [phone] Fax: [phone]

## 2017-10-24 ENCOUNTER — Encounter

## 2017-10-27 ENCOUNTER — Ambulatory Visit: Payer: Medicare Other

## 2017-10-27 DIAGNOSIS — R252 Cramp and spasm: Secondary | ICD-10-CM | POA: Diagnosis not present

## 2017-10-27 DIAGNOSIS — G588 Other specified mononeuropathies: Secondary | ICD-10-CM

## 2017-10-27 DIAGNOSIS — R293 Abnormal posture: Secondary | ICD-10-CM | POA: Diagnosis not present

## 2017-10-27 DIAGNOSIS — M940 Chondrocostal junction syndrome [Tietze]: Secondary | ICD-10-CM | POA: Diagnosis not present

## 2017-10-27 NOTE — Patient Instructions (Signed)
Wear of heel lift 3-4 hours in and out for 2 days then assess benefit and keep in all day as beneficial.   Exhale when arms at height of overhead stretch.  Then lower

## 2017-10-27 NOTE — Therapy (Signed)
[place], [place], [address] Phone: [phone]   Fax:  [phone]  Physical Therapy Treatment  Patient Details  Name: JALONDA ANTIGUA MRN: [id number] Date of Birth: [birth year] Referring Provider: Alysia Penna, MD   Encounter Date: 10/27/2017  PT End of Session - 10/27/17 1231    Visit Number  4    Number of Visits  8    Date for PT Re-Evaluation  11/07/17    Authorization Type  Medicare    PT Start Time  1230    PT Stop Time  1322    PT Time Calculation (min)  52 min    Activity Tolerance  Patient tolerated treatment well    Behavior During Therapy  [facility] for tasks assessed/performed       Past Medical History:  Diagnosis Date  . Age-related macular degeneration, wet, both eyes ([place])   . Alcohol dependence in remission ([place]) 05/22/2011  . Allergic rhinitis, cause unspecified 05/22/2011  . Allergy   . Anemia, iron deficiency 05/18/2011  . Cholelithiasis   . COPD (chronic obstructive pulmonary disease) ([place])   . DDD (degenerative disc disease), lumbar 05/18/2011  . Depression    "@ times" (09/19/2016)  . First degree AV block   . GERD (gastroesophageal reflux disease)   . Hiatal hernia   . History of blood transfusion    "3 or 4 related to childbirth"  . HTN (hypertension) 05/18/2011   pt denies this hx on 09/19/2016  . Hyperlipidemia 05/18/2011  . Macular degeneration   . Myocardial infarction ([place])    "EKG shows I've had 2; date unknown" (09/19/2016)  . OA (osteoarthritis)    "a little here and there; mainly in the back" (09/19/2016)  . Osteoporosis 05/18/2011  . PAT (paroxysmal atrial tachycardia) ([place])   . Pectus excavatum 05/18/2011  . Personal history of colonic polyps 10/28/2012  . Pneumonia    "as a child"  . SVT (supraventricular tachycardia) ([place])   . Urinary frequency     Past Surgical History:  Procedure Laterality Date  . ABDOMINAL HYSTERECTOMY  1988  . BREAST BIOPSY Right  1948   "it was ok"  . CARDIAC CATHETERIZATION    . CARDIOVASCULAR STRESS TEST  03/08/2009   EF 84%  . CATARACT EXTRACTION W/ INTRAOCULAR LENS  IMPLANT, BILATERAL Bilateral   . INGUINAL HERNIA REPAIR Right 06/23/2017   Procedure: REPAIR INCARCERATED  RIGHT FEMORAL HERNIA WITH MESH;  Surgeon: Erroll Luna, MD;  Location: [place];  Service: General;  Laterality: Right;  . KNEE ARTHROSCOPY Right   . RIGHT/LEFT HEART CATH AND CORONARY ANGIOGRAPHY N/A 07/25/2016   Procedure: Right/Left Heart Cath and Coronary Angiography;  Surgeon: Burnell Blanks, MD;  Location: [place] CV LAB;  Service: Cardiovascular;  Laterality: N/A;  . SVT ABLATION  09/19/2016  . SVT ABLATION N/A 09/19/2016   Procedure: SVT Ablation;  Surgeon: Thompson Grayer, MD;  Location: [place] CV LAB;  Service: Cardiovascular;  Laterality: N/A;  . TONSILLECTOMY AND ADENOIDECTOMY    . US ECHOCARDIOGRAPHY  01/17/2003   EF 55-60%    There were no vitals filed for this visit.  Subjective Assessment - 10/27/17 1234    Subjective  RT SI buttock pain.   Concern if exercises cause back pain.  Heat yesterday sat with heat.  did some of the exercises. No rib pain until mid afternoon.     Currently in Pain?  No/denies         [facility]  PT Assessment - 10/27/17 0001      Posture/Postural Control   Posture Comments  RT ilia higher                   [facility] Adult PT Treatment/Exercise - 10/27/17 0001      Ambulation/Gait   Gait Comments  with 1/8 inch lift she reported walking with good comfort      Self-Care   Self-Care  Other Self-Care Comments      Shoulder Exercises: Sidelying   Other Sidelying Exercises  arm openings x 5 RT and LT   with deep breathing and manual lift to ribs x 1 reps x 3 breaths      Supine overhead lift, LTR, side stretching       PT Education - 10/27/17 1331    Education provided  Yes    Education Details  managment of heel lift and   exhale with arm lifts    Person(s) Educated   Patient    Methods  Explanation;Demonstration;Verbal cues;Handout    Comprehension  Returned demonstration;Verbalized understanding          PT Long Term Goals - 10/27/17 1329      PT LONG TERM GOAL #1   Title  She will be indpendent with all HEP issued    Baseline  appears generally  able to do the HEp    Status  Partially Met      PT LONG TERM GOAL #2   Title  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 REviewed HEp and added exhale with arms overhead.  Contiue per PT discretion. she will go with 1/8th inch lift to start and add if feels helpful     PT Treatment/Interventions  Iontophoresis 79m/ml Dexamethasone;Moist Heat;Therapeutic exercise;Manual techniques;Patient/family education    PT Next Visit Plan  Review stretching , add to HEP as needed. , heat if needed,  manual for spine and rib mobility. continue thoracic mobility.  Ass heel lift    PT Home Exercise Plan  ball PA pressure, gentle rotation stretching , upper and lower  and shoulder flexion with wand with breathing     Consulted and Agree with Plan of Care  Patient       Patient will benefit from skilled therapeutic intervention in order to improve the following deficits and impairments:  Pain, Postural dysfunction, Decreased range of motion  Visit Diagnosis: Abnormal posture  Cramp and spasm  Costochondritis  Intercostal neuralgia     Problem List Patient Active Problem List   Diagnosis Date Noted  . Femoral hernia of right side with obstruction 06/23/2017  . CKD (chronic kidney disease), stage III ([place] 03/12/2017  . Weight loss 11/27/2016  . SVT (supraventricular tachycardia) ([place]  09/19/2016  . Left-sided nosebleed 09/11/2016  . Coronary artery disease involving native coronary artery of native heart without angina pectoris   . ILD (interstitial lung disease) ([place] 05/22/2016  . Constipation 03/13/2016  . Bronchiectasis without acute exacerbation ([place] 03/13/2016  . Chest pain  08/30/2015  . Dyspnea 08/02/2015  . Bilateral leg pain 02/01/2015  . Diastolic dysfunction 54/27/1566  . Peripheral edema 02/01/2015  . Abnormal EKG 05/18/2014  . Syncope 05/18/2014  . Dyslipidemia 05/14/2013  . Dyspnea on exertion 05/14/2013  . DOE (dyspnea on exertion) 04/28/2013  . Personal history of colonic polyps 10/28/2012  . Osteopenia 04/29/2012  . Elevated digoxin level 04/29/2012  . Allergic rhinitis, cause unspecified 05/22/2011  . Alcohol dependence in remission ([place]) 05/22/2011  . Depression 05/22/2011  . Preventative health care 05/18/2011  . HTN (hypertension) 05/18/2011  . Hyperlipidemia 05/18/2011  . Anemia, iron deficiency 05/18/2011  . GERD (gastroesophageal reflux disease) 05/18/2011  . DDD (degenerative disc disease), lumbar 05/18/2011  . Pectus excavatum 05/18/2011  . Macular degeneration   . OA (osteoarthritis)   . Cholelithiasis   . COPD (chronic obstructive pulmonary disease) ([place])   . Paroxysmal SVT (supraventricular tachycardia) ([place]) 02/07/2011    Darrel Hoover  PT 10/27/2017, 1:33 PM  [facility] [address] [place], [place], [address] Phone: [phone]   Fax:  [phone]  Name: CASONDRA GASCA MRN: [id number] Date of Birth: 1925/09/30

## 2017-10-29 ENCOUNTER — Other Ambulatory Visit: Payer: Self-pay | Admitting: Internal Medicine

## 2017-10-31 ENCOUNTER — Encounter: Payer: Self-pay | Admitting: Physical Therapy

## 2017-10-31 ENCOUNTER — Ambulatory Visit: Payer: Medicare Other | Admitting: Physical Therapy

## 2017-10-31 DIAGNOSIS — R252 Cramp and spasm: Secondary | ICD-10-CM | POA: Diagnosis not present

## 2017-10-31 DIAGNOSIS — M940 Chondrocostal junction syndrome [Tietze]: Secondary | ICD-10-CM | POA: Diagnosis not present

## 2017-10-31 DIAGNOSIS — G588 Other specified mononeuropathies: Secondary | ICD-10-CM | POA: Diagnosis not present

## 2017-10-31 DIAGNOSIS — R293 Abnormal posture: Secondary | ICD-10-CM | POA: Diagnosis not present

## 2017-10-31 NOTE — Patient Instructions (Signed)
   SINGLE KNEE TO CHEST STRETCH - SKTC  While lying on your back, use your hands and gently draw up a knee towards your chest.   Keep your other knee straight and lying on the ground.  Hold for 10 seconds, then switch sides.  Repeat 5 times each side, 1-2 times per day.      Lumbar Rotations   Lying on your back with your knees bent, slowly drop your legs to one side and hold the stretch. Come back to the middle and switch sides. You should feel the stretch in your back on the opposite side that your legs are leaning.  Hold for 10 seconds, then switch sides.  Repeat 5 times each side, 1-2 times per day.     DOUBLE KNEE TO CHEST STRETCH - DKTC  While Lying on your back,  hold your knees and gently pull them up towards your chest.  Hold for 10 seconds, then relax.  Repeat 3-5 times, 1-2 times per day.

## 2017-10-31 NOTE — Therapy (Signed)
[place], [place], [address] Phone: [phone]   Fax:  [phone]  Physical Therapy Treatment  Patient Details  Name: Alexandra Reyes MRN: [id number] Date of Birth: [birth year] Referring Provider: Alysia Penna, MD   Encounter Date: 10/31/2017  PT End of Session - 10/31/17 1058    Visit Number  5    Number of Visits  8    Date for PT Re-Evaluation  11/07/17    Authorization Type  Medicare    PT Start Time  1016    PT Stop Time  1058    PT Time Calculation (min)  42 min    Activity Tolerance  Patient tolerated treatment well    Behavior During Therapy  [facility] for tasks assessed/performed       Past Medical History:  Diagnosis Date  . Age-related macular degeneration, wet, both eyes ([place])   . Alcohol dependence in remission ([place]) 05/22/2011  . Allergic rhinitis, cause unspecified 05/22/2011  . Allergy   . Anemia, iron deficiency 05/18/2011  . Cholelithiasis   . COPD (chronic obstructive pulmonary disease) ([place])   . DDD (degenerative disc disease), lumbar 05/18/2011  . Depression    "@ times" (09/19/2016)  . First degree AV block   . GERD (gastroesophageal reflux disease)   . Hiatal hernia   . History of blood transfusion    "3 or 4 related to childbirth"  . HTN (hypertension) 05/18/2011   pt denies this hx on 09/19/2016  . Hyperlipidemia 05/18/2011  . Macular degeneration   . Myocardial infarction ([place])    "EKG shows I've had 2; date unknown" (09/19/2016)  . OA (osteoarthritis)    "a little here and there; mainly in the back" (09/19/2016)  . Osteoporosis 05/18/2011  . PAT (paroxysmal atrial tachycardia) ([place])   . Pectus excavatum 05/18/2011  . Personal history of colonic polyps 10/28/2012  . Pneumonia    "as a child"  . SVT (supraventricular tachycardia) ([place])   . Urinary frequency     Past Surgical History:  Procedure Laterality Date  . ABDOMINAL HYSTERECTOMY  1988  . BREAST BIOPSY Right  1948   "it was ok"  . CARDIAC CATHETERIZATION    . CARDIOVASCULAR STRESS TEST  03/08/2009   EF 84%  . CATARACT EXTRACTION W/ INTRAOCULAR LENS  IMPLANT, BILATERAL Bilateral   . INGUINAL HERNIA REPAIR Right 06/23/2017   Procedure: REPAIR INCARCERATED  RIGHT FEMORAL HERNIA WITH MESH;  Surgeon: Erroll Luna, MD;  Location: [place];  Service: General;  Laterality: Right;  . KNEE ARTHROSCOPY Right   . RIGHT/LEFT HEART CATH AND CORONARY ANGIOGRAPHY N/A 07/25/2016   Procedure: Right/Left Heart Cath and Coronary Angiography;  Surgeon: Burnell Blanks, MD;  Location: [facility] CV LAB;  Service: Cardiovascular;  Laterality: N/A;  . SVT ABLATION  09/19/2016  . SVT ABLATION N/A 09/19/2016   Procedure: SVT Ablation;  Surgeon: Thompson Grayer, MD;  Location: [place] CV LAB;  Service: Cardiovascular;  Laterality: N/A;  . TONSILLECTOMY AND ADENOIDECTOMY    . US ECHOCARDIOGRAPHY  01/17/2003   EF 55-60%    There were no vitals filed for this visit.  Subjective Assessment - 10/31/17 1018    Subjective  They added a small heel lift to me last time I came; I feel like my back is feeling better when I'm using the hee lift. The things we did last time I saw you took care of the pain in my upper back. I was able to  find/fix another heel lift for some other shoes I have. My lower back is bothering me the most today.     Patient Stated Goals  Decr pain so no meds for problem.      Currently in Pain?  Yes    Pain Score  4     Pain Location  Back    Pain Orientation  Lower    Pain Descriptors / Indicators  Aching    Pain Type  Chronic pain    Pain Radiating Towards  none     Pain Onset  More than a month ago    Pain Frequency  Intermittent    Aggravating Factors   not wearing my shoes with heel lift     Pain Relieving Factors  putting on shoes with heel lift     Effect of Pain on Daily Activities  mild                        [facility] Adult PT Treatment/Exercise - 10/31/17 0001       Lumbar Exercises: Stretches   Active Hamstring Stretch  2 reps;30 seconds    Single Knee to Chest Stretch  5 reps;10 seconds    Double Knee to Chest Stretch  3 reps;10 seconds    Lower Trunk Rotation  5 reps;10 seconds    Pelvic Tilt  10 reps    Piriformis Stretch  2 reps;30 seconds      Shoulder Exercises: Seated   Other Seated Exercises  thoracic excursions 3D 1x15 each     Other Seated Exercises  seated thoracic rotation stretch 5x10 seconds B              PT Education - 10/31/17 1058    Education provided  Yes    Education Details  HEP updates, management strategies for numerous HEP exercises     Person(s) Educated  Patient    Methods  Explanation;Demonstration;Handout    Comprehension  Verbalized understanding;Returned demonstration;Need further instruction          PT Long Term Goals - 10/27/17 1329      PT LONG TERM GOAL #1   Title  She will be indpendent with all HEP issued    Baseline  appears generally  able to do the HEp    Status  Partially Met      PT LONG TERM GOAL #2   Title  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 Continued interventions as able, tolerated, and appropriate this session. She continues to progress well with skilled PT services thus far and responds well to general stretching and tissue mobility work She may benefit from re-assessment soon to assess progress/further need for and focus of PT moving forward.     Rehab Potential  Good    PT Frequency  2x / week    PT Duration  4 weeks  PT  Treatment/Interventions  Iontophoresis 66m/ml Dexamethasone;Moist Heat;Therapeutic exercise;Manual techniques;Patient/family education    PT Next Visit Plan  Review stretching , add to HEP as needed. , heat if needed,  manual for spine and rib mobility. continue thoracic mobility.  Consider early re-assess.     PT Home Exercise Plan  ball PA pressure, gentle rotation stretching , upper and lower  and shoulder flexion with wand with breathing, SKTC, DKTC, lumbar rotations     Consulted and Agree with Plan of Care  Patient       Patient will benefit from skilled therapeutic intervention in order to improve the following deficits and impairments:  Pain, Postural dysfunction, Decreased range of motion  Visit Diagnosis: Abnormal posture  Cramp and spasm     Problem List Patient Active Problem List   Diagnosis Date Noted  . Femoral hernia of right side with obstruction 06/23/2017  . CKD (chronic kidney disease), stage III ([place] 03/12/2017  . Weight loss 11/27/2016  . SVT (supraventricular tachycardia) ([place] 09/19/2016  . Left-sided nosebleed 09/11/2016  . Coronary artery disease involving native coronary artery of native heart without angina pectoris   . ILD (interstitial lung disease) ([place] 05/22/2016  . Constipation 03/13/2016  . Bronchiectasis without acute exacerbation ([place] 03/13/2016  . Chest pain 08/30/2015  . Dyspnea 08/02/2015  . Bilateral leg pain 02/01/2015  . Diastolic dysfunction 011/91/4782 . Peripheral edema 02/01/2015  . Abnormal EKG 05/18/2014  . Syncope 05/18/2014  . Dyslipidemia 05/14/2013  . Dyspnea on exertion 05/14/2013  . DOE (dyspnea on exertion) 04/28/2013  . Personal history of colonic polyps 10/28/2012  . Osteopenia 04/29/2012  . Elevated digoxin level 04/29/2012  . Allergic rhinitis, cause unspecified 05/22/2011  . Alcohol dependence in remission ([place] 05/22/2011  . Depression 05/22/2011  . Preventative health care 05/18/2011  . HTN (hypertension)  05/18/2011  . Hyperlipidemia 05/18/2011  . Anemia, iron deficiency 05/18/2011  . GERD (gastroesophageal reflux disease) 05/18/2011  . DDD (degenerative disc disease), lumbar 05/18/2011  . Pectus excavatum 05/18/2011  . Macular degeneration   . OA (osteoarthritis)   . Cholelithiasis   . COPD (chronic obstructive pulmonary disease) ([place]   . Paroxysmal SVT (supraventricular tachycardia) ([place] 02/07/2011    KDeniece ReePT, DPT, CBIS  Supplemental Physical Therapist [place]  Pager 3[facility] [address]Phone: [phone]  Fax:  [phone] Name: FTYRAH BROERSMRN: [id number] of Birth: 912/24/1927

## 2017-11-05 ENCOUNTER — Encounter: Payer: Self-pay | Admitting: Physical Therapy

## 2017-11-05 ENCOUNTER — Ambulatory Visit: Payer: Medicare Other | Admitting: Physical Therapy

## 2017-11-05 DIAGNOSIS — G588 Other specified mononeuropathies: Secondary | ICD-10-CM | POA: Diagnosis not present

## 2017-11-05 DIAGNOSIS — M940 Chondrocostal junction syndrome [Tietze]: Secondary | ICD-10-CM

## 2017-11-05 DIAGNOSIS — N816 Rectocele: Secondary | ICD-10-CM | POA: Diagnosis not present

## 2017-11-05 DIAGNOSIS — R252 Cramp and spasm: Secondary | ICD-10-CM | POA: Diagnosis not present

## 2017-11-05 DIAGNOSIS — R293 Abnormal posture: Secondary | ICD-10-CM

## 2017-11-05 NOTE — Therapy (Signed)
[place], [place], [address] Phone: [phone]   Fax:  [phone]  Physical Therapy Treatment  Patient Details  Name: Alexandra Reyes MRN: [id number] Date of Birth: Oct 16, 1925 Referring Provider: Alysia Penna, MD   Encounter Date: 11/05/2017  PT End of Session - 11/05/17 1455    Visit Number  6    Number of Visits  8    Date for PT Re-Evaluation  11/07/17    Authorization Type  Medicare    PT Start Time  6834    PT Stop Time  1456    PT Time Calculation (min)  41 min    Activity Tolerance  Patient tolerated treatment well    Behavior During Therapy  [facility] for tasks assessed/performed       Past Medical History:  Diagnosis Date  . Age-related macular degeneration, wet, both eyes ([place])   . Alcohol dependence in remission ([place]) 05/22/2011  . Allergic rhinitis, cause unspecified 05/22/2011  . Allergy   . Anemia, iron deficiency 05/18/2011  . Cholelithiasis   . COPD (chronic obstructive pulmonary disease) ([place])   . DDD (degenerative disc disease), lumbar 05/18/2011  . Depression    "@ times" (09/19/2016)  . First degree AV block   . GERD (gastroesophageal reflux disease)   . Hiatal hernia   . History of blood transfusion    "3 or 4 related to childbirth"  . HTN (hypertension) 05/18/2011   pt denies this hx on 09/19/2016  . Hyperlipidemia 05/18/2011  . Macular degeneration   . Myocardial infarction ([place])    "EKG shows I've had 2; date unknown" (09/19/2016)  . OA (osteoarthritis)    "a little here and there; mainly in the back" (09/19/2016)  . Osteoporosis 05/18/2011  . PAT (paroxysmal atrial tachycardia) ([place])   . Pectus excavatum 05/18/2011  . Personal history of colonic polyps 10/28/2012  . Pneumonia    "as a child"  . SVT (supraventricular tachycardia) ([place])   . Urinary frequency     Past Surgical History:  Procedure Laterality Date  . ABDOMINAL HYSTERECTOMY  1988  . BREAST BIOPSY Right  1948   "it was ok"  . CARDIAC CATHETERIZATION    . CARDIOVASCULAR STRESS TEST  03/08/2009   EF 84%  . CATARACT EXTRACTION W/ INTRAOCULAR LENS  IMPLANT, BILATERAL Bilateral   . INGUINAL HERNIA REPAIR Right 06/23/2017   Procedure: REPAIR INCARCERATED  RIGHT FEMORAL HERNIA WITH MESH;  Surgeon: Erroll Luna, MD;  Location: [place];  Service: General;  Laterality: Right;  . KNEE ARTHROSCOPY Right   . RIGHT/LEFT HEART CATH AND CORONARY ANGIOGRAPHY N/A 07/25/2016   Procedure: Right/Left Heart Cath and Coronary Angiography;  Surgeon: Burnell Blanks, MD;  Location: [place] CV LAB;  Service: Cardiovascular;  Laterality: N/A;  . SVT ABLATION  09/19/2016  . SVT ABLATION N/A 09/19/2016   Procedure: SVT Ablation;  Surgeon: Thompson Grayer, MD;  Location: [place] CV LAB;  Service: Cardiovascular;  Laterality: N/A;  . TONSILLECTOMY AND ADENOIDECTOMY    . US ECHOCARDIOGRAPHY  01/17/2003   EF 55-60%    There were no vitals filed for this visit.  Subjective Assessment - 11/05/17 1415    Subjective  I am feeling good today, I stopped taking that gel as it was too much to put it on 4x/day and it didn't feel like it was helping. I see my pain management doctor Monday. I felt good after last session but the next day I was sore  again. I stopped pulling both knees up into my chest as it was hurting. My back hurts when I iron.     Patient Stated Goals  Decr pain so no meds for problem.      Currently in Pain?  No/denies                       [facility] Adult PT Treatment/Exercise - 11/05/17 0001      Lumbar Exercises: Stretches   Single Knee to Chest Stretch  5 reps;10 seconds    Lower Trunk Rotation  5 reps;10 seconds    Hip Flexor Stretch  2 reps;30 seconds thomas stretch     Piriformis Stretch  2 reps;30 seconds      Shoulder Exercises: Seated   Other Seated Exercises  thoracic flexion/extension 1x15              PT Education - 11/05/17 1455    Education provided  Yes     Education Details  back pain, pathology of back pain, benefits of exercises versus wearing corset, causes of back pain, also biomechanical techniques to reduce pain such as improving posture, and home modifications to maintain good posture to reduce pain.     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 Patient very inquisitive and talkative this session, so spent quite a bit of time discussing back pain, pathology of back pain, benefits of exercises versus wearing corset, causes of back pain, also biomechanical techniques to reduce pain such as improving posture, and home modifications to maintain good posture to reduce pain. Performed functional exercises and stretches as time allowed this session, recommend re-assessment and possible DC next session.     Rehab Potential  Good    PT Frequency  2x / week    PT Duration  4 weeks    PT Treatment/Interventions  Iontophoresis 93m/ml Dexamethasone;Moist Heat;Therapeutic exercise;Manual techniques;Patient/family education    PT Next Visit Plan  re-assess, possible DC     PT Home Exercise Plan  ball PA pressure, gentle rotation stretching , upper and lower  and shoulder flexion with wand with breathing, SKTC, DKTC, lumbar rotations     Consulted and Agree with  Plan of Care  Patient       Patient will benefit from skilled therapeutic intervention in order to improve the following deficits and impairments:  Pain, Postural dysfunction, Decreased range of motion  Visit Diagnosis: Abnormal posture  Cramp and spasm  Costochondritis     Problem List Patient Active Problem List   Diagnosis Date Noted  . Femoral hernia of right side with obstruction 06/23/2017  . CKD (chronic kidney disease), stage III ([place]) 03/12/2017  . Weight loss 11/27/2016  . SVT (supraventricular tachycardia) ([place]) 09/19/2016  . Left-sided nosebleed 09/11/2016  . Coronary artery disease involving native coronary artery of native heart without angina pectoris   . ILD (interstitial lung disease) ([place]) 05/22/2016  . Constipation 03/13/2016  . Bronchiectasis without acute exacerbation ([place]) 03/13/2016  . Chest pain 08/30/2015  . Dyspnea 08/02/2015  . Bilateral leg pain 02/01/2015  . Diastolic dysfunction 33/82/5053  . Peripheral edema 02/01/2015  . Abnormal EKG 05/18/2014  . Syncope 05/18/2014  . Dyslipidemia 05/14/2013  . Dyspnea on exertion 05/14/2013  . DOE (dyspnea on exertion) 04/28/2013  . Personal history of colonic polyps 10/28/2012  . Osteopenia 04/29/2012  . Elevated digoxin level 04/29/2012  . Allergic rhinitis, cause unspecified 05/22/2011  . Alcohol dependence in remission ([place]) 05/22/2011  . Depression 05/22/2011  . Preventative health care 05/18/2011  . HTN (hypertension) 05/18/2011  . Hyperlipidemia 05/18/2011  . Anemia, iron deficiency 05/18/2011  . GERD (gastroesophageal reflux disease) 05/18/2011  . DDD (degenerative disc disease), lumbar 05/18/2011  . Pectus excavatum 05/18/2011  . Macular degeneration   . OA (osteoarthritis)   . Cholelithiasis   . COPD (chronic obstructive pulmonary disease) ([place])   . Paroxysmal SVT (supraventricular tachycardia) ([place]) 02/07/2011    Deniece Ree PT, DPT, CBIS  Supplemental Physical Therapist [place]   Pager [place] [address] [place], [place], [address] Phone: [phone]   Fax:  [phone]  Name: Alexandra Reyes MRN: [id number] Date of Birth: Sep 02, 1925

## 2017-11-07 ENCOUNTER — Ambulatory Visit: Payer: Medicare Other | Admitting: Physical Therapy

## 2017-11-07 ENCOUNTER — Encounter: Payer: Self-pay | Admitting: Physical Therapy

## 2017-11-07 DIAGNOSIS — M940 Chondrocostal junction syndrome [Tietze]: Secondary | ICD-10-CM

## 2017-11-07 DIAGNOSIS — R293 Abnormal posture: Secondary | ICD-10-CM

## 2017-11-07 DIAGNOSIS — R252 Cramp and spasm: Secondary | ICD-10-CM

## 2017-11-07 DIAGNOSIS — G588 Other specified mononeuropathies: Secondary | ICD-10-CM | POA: Diagnosis not present

## 2017-11-07 NOTE — Therapy (Signed)
[place], [place], [address] Phone: [phone]   Fax:  [phone]  Physical Therapy Treatment (Discharge)  Patient Details  Name: Alexandra Reyes: [id number] Date of Birth: 09/04/1925 Referring Provider: Alysia Penna, MD   Encounter Date: 11/07/2017    PHYSICAL THERAPY DISCHARGE SUMMARY  Visits from Start of Care: 7  Current functional level related to goals / functional outcomes: Patient able to do all she needs and wants at this time without significant difficulty or pain, DC today due to having met goals and due to patient satisfaction with current functional level.    Remaining deficits: See below    Education / Equipment: See below  Plan: Patient agrees to discharge.  Patient goals were met. Patient is being discharged due to meeting the stated rehab goals.  ?????       PT End of Session - 11/07/17 1041    Visit Number  7    Number of Visits  7    Date for PT Re-Evaluation  11/07/17    Authorization Type  Medicare    PT Start Time  1017    PT Stop Time  1035    PT Time Calculation (min)  18 min    Activity Tolerance  Patient tolerated treatment well    Behavior During Therapy  WFL for tasks assessed/performed       Past Medical History:  Diagnosis Date  . Age-related macular degeneration, wet, both eyes ([place])   . Alcohol dependence in remission ([place]) 05/22/2011  . Allergic rhinitis, cause unspecified 05/22/2011  . Allergy   . Anemia, iron deficiency 05/18/2011  . Cholelithiasis   . COPD (chronic obstructive pulmonary disease) ([place])   . DDD (degenerative disc disease), lumbar 05/18/2011  . Depression    "@ times" (09/19/2016)  . First degree AV block   . GERD (gastroesophageal reflux disease)   . Hiatal hernia   . History of blood transfusion    "3 or 4 related to childbirth"  . HTN (hypertension) 05/18/2011   pt denies this hx on 09/19/2016  . Hyperlipidemia  05/18/2011  . Macular degeneration   . Myocardial infarction ([place])    "EKG shows I've had 2; date unknown" (09/19/2016)  . OA (osteoarthritis)    "a little here and there; mainly in the back" (09/19/2016)  . Osteoporosis 05/18/2011  . PAT (paroxysmal atrial tachycardia) ([place])   . Pectus excavatum 05/18/2011  . Personal history of colonic polyps 10/28/2012  . Pneumonia    "as a child"  . SVT (supraventricular tachycardia) ([place])   . Urinary frequency     Past Surgical History:  Procedure Laterality Date  . ABDOMINAL HYSTERECTOMY  1988  . BREAST BIOPSY Right 1948   "it was ok"  . CARDIAC CATHETERIZATION    . CARDIOVASCULAR STRESS TEST  03/08/2009   EF 84%  . CATARACT EXTRACTION W/ INTRAOCULAR LENS  IMPLANT, BILATERAL Bilateral   . INGUINAL HERNIA REPAIR Right 06/23/2017   Procedure: REPAIR INCARCERATED  RIGHT FEMORAL HERNIA WITH MESH;  Surgeon: Erroll Luna, MD;  Location: [facility];  Service: General;  Laterality: Right;  . KNEE ARTHROSCOPY Right   . RIGHT/LEFT HEART CATH AND CORONARY ANGIOGRAPHY N/A 07/25/2016   Procedure: Right/Left Heart Cath and Coronary Angiography;  Surgeon: Burnell Blanks, MD;  Location: [facility];  Service: Cardiovascular;  Laterality: N/A;  . SVT ABLATION  09/19/2016  . SVT ABLATION N/A 09/19/2016   Procedure: SVT Ablation;  Surgeon:  Thompson Grayer, MD;  Location: [facility];  Service: Cardiovascular;  Laterality: N/A;  . TONSILLECTOMY AND ADENOIDECTOMY    . US ECHOCARDIOGRAPHY  01/17/2003   EF 55-60%    There were no vitals filed for this visit.  Subjective Assessment - 11/07/17 1019    Subjective  I forgot to do my exercises this morning, I noticed I am stiff. I am OK with making today my last day. I stuck a bobby pin in my ear and now I can't hear much.     Patient Stated Goals  Decr pain so no meds for problem.      Currently in Pain?  No/denies         [facility] PT Assessment - 11/07/17 0001      AROM   Lumbar Flexion  mild  limiitation     Lumbar Extension  mild limitation     Lumbar - Right Side Bend  WFL     Lumbar - Left Side Bend  [facility]     Thoracic Flexion  WNL     Thoracic Extension  mild limitation     Thoracic - Right Side Bend  [facility]     Thoracic - Left Side Bend  [facility]     Thoracic - Right Rotation  moderate limitation     Thoracic - Left Rotation  moderate limitation                            PT Education - 11/07/17 1041    Education provided  Yes    Education Details  progress towards all goals, DC today     Person(s) Educated  Patient    Methods  Explanation    Comprehension  Verbalized understanding          PT Long Term Goals - 11/07/17 1024      PT LONG TERM GOAL #1   Title  She will be indpendent with all HEP issued    Baseline  5/31- going well, doing almost every day     Time  4    Period  Weeks    Status  Achieved      PT LONG TERM GOAL #2   Title  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 She has improved greatly with skilled PT services, and has met all goals at this time. She is confident with her HEP and is pleased with her current level of function at this time. She is agreeable to DC at this time- discharge today due to having met goals and being pleased with current level of function.     Rehab Potential  Good    PT Frequency  2x / week    PT Duration  4 weeks  PT Treatment/Interventions  Iontophoresis 61m/ml Dexamethasone;Moist Heat;Therapeutic exercise;Manual techniques;Patient/family education    PT Next Visit Plan   DC today due to patient satisfaction with functional status, all goals met     PT Home Exercise Plan  ball PA pressure, gentle rotation stretching , upper and lower  and shoulder flexion with wand with breathing, SKTC, DKTC, lumbar rotations     Consulted and Agree with Plan of Care  Patient       Patient will benefit from skilled therapeutic intervention in order to improve the following deficits and impairments:  Pain, Postural dysfunction, Decreased range of motion  Visit Diagnosis: Abnormal posture  Cramp and spasm  Costochondritis     Problem List Patient Active Problem List   Diagnosis Date Noted  . Femoral hernia of right side with obstruction 06/23/2017  . CKD (chronic kidney disease), stage III ([place] 03/12/2017  . Weight loss 11/27/2016  . SVT (supraventricular tachycardia) ([address] 09/19/2016  . Left-sided nosebleed 09/11/2016  . Coronary artery disease involving native coronary artery of native heart without angina pectoris   . ILD (interstitial lung disease) ([place] 05/22/2016  . Constipation 03/13/2016  . Bronchiectasis without acute exacerbation ([place] 03/13/2016  . Chest pain 08/30/2015  . Dyspnea 08/02/2015  . Bilateral leg pain 02/01/2015  . Diastolic dysfunction 021/79/8102 . Peripheral edema 02/01/2015  . Abnormal EKG 05/18/2014  . Syncope 05/18/2014  . Dyslipidemia 05/14/2013  . Dyspnea on exertion 05/14/2013  . DOE (dyspnea on exertion) 04/28/2013  . Personal history of colonic polyps 10/28/2012  . Osteopenia 04/29/2012  . Elevated digoxin level 04/29/2012  . Allergic rhinitis, cause unspecified 05/22/2011  . Alcohol dependence in remission ([place] 05/22/2011  . Depression 05/22/2011  . Preventative health care 05/18/2011  . HTN (hypertension) 05/18/2011  . Hyperlipidemia 05/18/2011  . Anemia, iron deficiency 05/18/2011  . GERD (gastroesophageal reflux disease) 05/18/2011  . DDD (degenerative disc disease), lumbar 05/18/2011  . Pectus excavatum  05/18/2011  . Macular degeneration   . OA (osteoarthritis)   . Cholelithiasis   . COPD (chronic obstructive pulmonary disease) ([place]   . Paroxysmal SVT (supraventricular tachycardia) ([place] 02/07/2011    KDeniece ReePT, DPT, CBIS  Supplemental Physical Therapist [place]  Pager 3[facility] [address]Phone: [phone]  Fax:  [phone] Name: FCAMARYN LUMBERTMRN: [id number] of Birth: [birth year]

## 2017-11-07 NOTE — Patient Instructions (Signed)
   Thoracic Rotation   In seated position, patient will move slowly through full, pain free range of motion. Full rotation ,right and left, of the trunk with scapular protraction and for mobility and stability of the spine.  Make sure you are turning at your shoulders; do not turn your head one way or the other- it is just along for the ride as shown in the picture!  Repeat 15 times each side, 1-2 times per day.

## 2017-11-10 ENCOUNTER — Encounter: Payer: Medicare Other | Attending: Physical Medicine & Rehabilitation

## 2017-11-10 ENCOUNTER — Encounter: Payer: Self-pay | Admitting: Physical Medicine & Rehabilitation

## 2017-11-10 ENCOUNTER — Other Ambulatory Visit: Payer: Self-pay

## 2017-11-10 ENCOUNTER — Ambulatory Visit (HOSPITAL_BASED_OUTPATIENT_CLINIC_OR_DEPARTMENT_OTHER): Payer: Medicare Other | Admitting: Physical Medicine & Rehabilitation

## 2017-11-10 VITALS — BP 127/76 | HR 73 | Ht 63.0 in | Wt 138.0 lb

## 2017-11-10 DIAGNOSIS — R079 Chest pain, unspecified: Secondary | ICD-10-CM | POA: Diagnosis not present

## 2017-11-10 DIAGNOSIS — Z9889 Other specified postprocedural states: Secondary | ICD-10-CM | POA: Diagnosis not present

## 2017-11-10 DIAGNOSIS — M792 Neuralgia and neuritis, unspecified: Secondary | ICD-10-CM | POA: Insufficient documentation

## 2017-11-10 DIAGNOSIS — M940 Chondrocostal junction syndrome [Tietze]: Secondary | ICD-10-CM

## 2017-11-10 DIAGNOSIS — M6281 Muscle weakness (generalized): Secondary | ICD-10-CM | POA: Insufficient documentation

## 2017-11-10 DIAGNOSIS — G588 Other specified mononeuropathies: Secondary | ICD-10-CM | POA: Diagnosis not present

## 2017-11-10 NOTE — Progress Notes (Signed)
Subjective:    Patient ID: Alexandra Reyes, female    DOB: 15-Jun-1925, 84 y.o.   MRN: [id number]  HPI  CC Right sided chest pain 84 year old female with a history of chronic chest pain evaluated by cardiology.  She is also evaluated by pulmonology and diagnosed with costochondritis. Patient was evaluated at this clinic on 08/26/2017.  Diagnosed with costochondritis as well as intercostal neuritis on the right side.  She was referred to physical therapy Voltaren plus PT seems to be helping  PT did aggravate low back but now is doing better with this.  Shoe lift left side for pelvis obliquity, had an extra 1/8 in added by PT, this was helpful for her lower  R inquinal hernia repair in January which has caused some debility.  Tenderness at the right seventh and eighth ribs Pain Inventory Average Pain 3 Pain Right Now 0 My pain is no pain  In the last 24 hours, has pain interfered with the following? General activity 2 Relation with others 0 Enjoyment of life 2 What TIME of day is your pain at its worst? daytime evening Sleep (in general) Fair  Pain is worse with: some activites Pain improves with: therapy/exercise Relief from Meds: 4  Mobility walk without assistance ability to climb steps?  yes do you drive?  yes  Function retired  Neuro/Psych No problems in this area  Prior Studies Any changes since last visit?  no  Physicians involved in your care physical therapy   Family History  Problem Relation Age of Onset  . Heart failure Mother   . Arthritis Mother   . Kidney failure Father   . Heart disease Father   . Hypertension Father    Social History   Socioeconomic History  . Marital status: Widowed    Spouse name: Not on file  . Number of children: Y  . Years of education: 40  . Highest education level: Not on file  Occupational History  . Occupation: Retired Designer, jewellery  Social Needs  . Financial resource strain: Not on file  . Food  insecurity:    Worry: Not on file    Inability: Not on file  . Transportation needs:    Medical: Not on file    Non-medical: Not on file  Tobacco Use  . Smoking status: Former Smoker    Packs/day: 1.50    Years: 36.00    Pack years: 54.00    Types: Cigarettes    Start date: 03/01/1959    Last attempt to quit: 06/10/1994    Years since quitting: 23.4  . Smokeless tobacco: Never Used  Substance and Sexual Activity  . Alcohol use: No    Frequency: Never  . Drug use: No  . Sexual activity: Not on file  Lifestyle  . Physical activity:    Days per week: Not on file    Minutes per session: Not on file  . Stress: Not on file  Relationships  . Social connections:    Talks on phone: Not on file    Gets together: Not on file    Attends religious service: Not on file    Active member of club or organization: Not on file    Attends meetings of clubs or organizations: Not on file    Relationship status: Not on file  Other Topics Concern  . Not on file  Social History Narrative   Originally from [place], [place]. She moved to [place] in 1950 during the polio epidemic.  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 Remote bird exposure. No mold or hot tub exposure.    Past Surgical History:  Procedure Laterality Date  . ABDOMINAL HYSTERECTOMY  1988  . BREAST BIOPSY Right 1948   "it was ok"  . CARDIAC CATHETERIZATION    . CARDIOVASCULAR STRESS TEST  03/08/2009   EF 84%  . CATARACT EXTRACTION W/ INTRAOCULAR LENS  IMPLANT, BILATERAL Bilateral   . INGUINAL HERNIA REPAIR Right 06/23/2017   Procedure: REPAIR INCARCERATED  RIGHT FEMORAL HERNIA WITH MESH;  Surgeon: Harriette Bouillon, MD;  Location: [facility];  Service: General;  Laterality: Right;  . KNEE ARTHROSCOPY Right   . RIGHT/LEFT HEART CATH AND CORONARY ANGIOGRAPHY N/A 07/25/2016   Procedure: Right/Left Heart Cath and Coronary Angiography;  Surgeon: Kathleene Hazel, MD;  [facility];  Service: Cardiovascular;  Laterality:  N/A;  . SVT ABLATION  09/19/2016  . SVT ABLATION N/A 09/19/2016   Procedure: SVT Ablation;  Surgeon: Hillis Range, MD;  Location[facility] LAB;  Service: Cardiovascular;  Laterality: N/A;  . TONSILLECTOMY AND ADENOIDECTOMY    . US ECHOCARDIOGRAPHY  01/17/2003   EF 55-60%   Past Medical History:  Diagnosis Date  . Age-related macular degeneration, wet, both eyes ([facility])   . Alcohol dependence in remission ([facility]) 05/22/2011  . Allergic rhinitis, cause unspecified 05/22/2011  . Allergy   . Anemia, iron deficiency 05/18/2011  . Cholelithiasis   . COPD (chronic obstructive pulmonary disease) ([facility])   . DDD (degenerative disc disease), lumbar 05/18/2011  . Depression    "@ times" (09/19/2016)  . First degree AV block   . GERD (gastroesophageal reflux disease)   . Hiatal hernia   . History of blood transfusion    "3 or 4 related to childbirth"  . HTN (hypertension) 05/18/2011   pt denies this hx on 09/19/2016  . Hyperlipidemia 05/18/2011  . Macular degeneration   . Myocardial infarction ([facility])    "EKG shows I've had 2; date unknown" (09/19/2016)  . OA (osteoarthritis)    "a little here and there; mainly in the back" (09/19/2016)  . Osteoporosis 05/18/2011  . PAT (paroxysmal atrial tachycardia) ([facility])   . Pectus excavatum 05/18/2011  . Personal history of colonic polyps 10/28/2012  . Pneumonia    "as a child"  . SVT (supraventricular tachycardia) ([facility])   . Urinary frequency    BP 127/76   Pulse 73   Ht 5\' 3"  (1.6 m) Comment: pt reported  Wt 138 lb (62.6 kg)   LMP  (LMP Unknown)   SpO2 95%   BMI 24.45 kg/m   Opioid Risk Score:   Fall Risk Score:  `1  Depression screen PHQ 2/9  Depression screen [facility] 2/9 11/10/2017 09/22/2017 08/26/2017 07/04/2017 12/25/2016 09/11/2016 08/02/2015  Decreased Interest 0 0 0 0 0 0 0  Down, Depressed, Hopeless 0 0 0 1 1 0 0  PHQ - 2 Score 0 0 0 1 1 0 0  Altered sleeping - - - - 1 - -  Tired, decreased energy - - - - 1 - -  Change in appetite - - - - 1 - -    Feeling bad or failure about yourself  - - - - 0 - -  Trouble concentrating - - - - 0 - -  Moving slowly or fidgety/restless - - - - 0 - -  Suicidal thoughts - - - - 0 - -  PHQ-9 Score - - - - 4 - -  Difficult doing work/chores - - - - Not difficult at all - -    Review of Systems  Constitutional: Negative.   HENT: Negative.   Eyes: Negative.   Respiratory: Negative.   Cardiovascular: Negative.   Gastrointestinal: Negative.   Endocrine: Negative.   Genitourinary: Negative.   Musculoskeletal: Negative.   Skin: Negative.   Allergic/Immunologic: Negative.   Neurological: Negative.   Hematological: Negative.   Psychiatric/Behavioral: Negative.   All other systems reviewed and are negative.      Objective:   Physical Exam  Constitutional: She appears well-developed and well-nourished.  HENT:  Head: Normocephalic and atraumatic.  Eyes: Pupils are equal, round, and reactive to light. EOM are normal.  Psychiatric: Her behavior is normal. Judgment and thought content normal.  Nursing note and vitals reviewed.   /Intercostal space at the Smith Internationalmid/axillary line Motor strength is 5/5 bilateral deltoid bicep tricep grip as well as bilateral hip flexor knee extensor ankle dorsiflexor Gait without evidence of toe drag or knee instability. She does have a mild levoscoliosis. Mood and affect are appropriate      Assessment & Plan:  1.  Costochondritis and right intercostal neuritis, improved.  She is no longer using the Voltaren gel.  She is completed physical therapy and I have emphasized the need to continue with her home exercise program.  We discussed weaning down on the Neurontin to 100 mg 3 times daily, she may reduce the dose further as tolerated down to nightly.  She does feel like the Neurontin helps her sleep at night. Physical medicine and rehabilitation follow-up in 3 months If her pain increases in the interval time she may resume the Voltaren gel.

## 2017-11-10 NOTE — Patient Instructions (Addendum)
Please continue your home exercise program  Resume Voltaren gel if pain worsens over time   You may try reducing Neurontin to 3 times a day or less if your pain stays well controlled

## 2017-11-11 ENCOUNTER — Other Ambulatory Visit: Payer: Self-pay | Admitting: Internal Medicine

## 2017-11-27 ENCOUNTER — Telehealth: Payer: Self-pay | Admitting: Internal Medicine

## 2017-11-27 NOTE — Telephone Encounter (Signed)
Returned call to Pt. Advised it would be ok to take prednisone if ordered, advised it could make her heart race.  Pt has PRN diltiazem she can take. Pt indicates understanding.  Thankful for return call.

## 2017-11-27 NOTE — Telephone Encounter (Signed)
Pt calling    Pt want to know if she can take prednisone if her Ortho doc says she need it. Please advise.

## 2017-11-28 DIAGNOSIS — M1612 Unilateral primary osteoarthritis, left hip: Secondary | ICD-10-CM | POA: Diagnosis not present

## 2017-11-28 DIAGNOSIS — M5416 Radiculopathy, lumbar region: Secondary | ICD-10-CM | POA: Diagnosis not present

## 2017-12-03 ENCOUNTER — Ambulatory Visit: Payer: Medicare Other | Admitting: Internal Medicine

## 2017-12-03 DIAGNOSIS — M1612 Unilateral primary osteoarthritis, left hip: Secondary | ICD-10-CM | POA: Diagnosis not present

## 2017-12-08 ENCOUNTER — Other Ambulatory Visit: Payer: Self-pay | Admitting: Physical Medicine & Rehabilitation

## 2017-12-15 ENCOUNTER — Ambulatory Visit (INDEPENDENT_AMBULATORY_CARE_PROVIDER_SITE_OTHER): Payer: Medicare Other | Admitting: Internal Medicine

## 2017-12-15 ENCOUNTER — Other Ambulatory Visit (INDEPENDENT_AMBULATORY_CARE_PROVIDER_SITE_OTHER): Payer: Medicare Other

## 2017-12-15 ENCOUNTER — Encounter: Payer: Self-pay | Admitting: Internal Medicine

## 2017-12-15 VITALS — BP 124/76 | HR 70 | Temp 98.0°F | Ht 63.0 in | Wt 135.0 lb

## 2017-12-15 DIAGNOSIS — J449 Chronic obstructive pulmonary disease, unspecified: Secondary | ICD-10-CM | POA: Diagnosis not present

## 2017-12-15 DIAGNOSIS — N183 Chronic kidney disease, stage 3 unspecified: Secondary | ICD-10-CM

## 2017-12-15 DIAGNOSIS — R739 Hyperglycemia, unspecified: Secondary | ICD-10-CM

## 2017-12-15 DIAGNOSIS — I10 Essential (primary) hypertension: Secondary | ICD-10-CM

## 2017-12-15 DIAGNOSIS — D509 Iron deficiency anemia, unspecified: Secondary | ICD-10-CM | POA: Diagnosis not present

## 2017-12-15 LAB — CBC WITH DIFFERENTIAL/PLATELET
BASOS ABS: 0.1 10*3/uL (ref 0.0–0.1)
BASOS PCT: 0.8 % (ref 0.0–3.0)
EOS ABS: 0.1 10*3/uL (ref 0.0–0.7)
Eosinophils Relative: 1.6 % (ref 0.0–5.0)
HCT: 43.7 % (ref 36.0–46.0)
HEMOGLOBIN: 14.7 g/dL (ref 12.0–15.0)
LYMPHS PCT: 9.8 % — AB (ref 12.0–46.0)
Lymphs Abs: 0.8 10*3/uL (ref 0.7–4.0)
MCHC: 33.5 g/dL (ref 30.0–36.0)
MCV: 89.3 fl (ref 78.0–100.0)
MONO ABS: 0.8 10*3/uL (ref 0.1–1.0)
Monocytes Relative: 10.3 % (ref 3.0–12.0)
Neutro Abs: 6.1 10*3/uL (ref 1.4–7.7)
Neutrophils Relative %: 77.5 % — ABNORMAL HIGH (ref 43.0–77.0)
Platelets: 407 10*3/uL — ABNORMAL HIGH (ref 150.0–400.0)
RBC: 4.89 Mil/uL (ref 3.87–5.11)
RDW: 13.6 % (ref 11.5–15.5)
WBC: 7.8 10*3/uL (ref 4.0–10.5)

## 2017-12-15 LAB — IBC PANEL
IRON: 74 ug/dL (ref 42–145)
Saturation Ratios: 24.4 % (ref 20.0–50.0)
Transferrin: 217 mg/dL (ref 212.0–360.0)

## 2017-12-15 LAB — HEPATIC FUNCTION PANEL
ALBUMIN: 4 g/dL (ref 3.5–5.2)
ALT: 15 U/L (ref 0–35)
AST: 18 U/L (ref 0–37)
Alkaline Phosphatase: 161 U/L — ABNORMAL HIGH (ref 39–117)
Bilirubin, Direct: 0.1 mg/dL (ref 0.0–0.3)
Total Bilirubin: 0.5 mg/dL (ref 0.2–1.2)
Total Protein: 6.7 g/dL (ref 6.0–8.3)

## 2017-12-15 LAB — BASIC METABOLIC PANEL
BUN: 12 mg/dL (ref 6–23)
CALCIUM: 8.9 mg/dL (ref 8.4–10.5)
CO2: 28 mEq/L (ref 19–32)
Chloride: 98 mEq/L (ref 96–112)
Creatinine, Ser: 0.69 mg/dL (ref 0.40–1.20)
GFR: 84.99 mL/min (ref >60.00)
Glucose, Bld: 75 mg/dL (ref 70–99)
POTASSIUM: 4.6 meq/L (ref 3.5–5.1)
SODIUM: 133 meq/L — AB (ref 135–145)

## 2017-12-15 LAB — HEMOGLOBIN A1C: HEMOGLOBIN A1C: 6 % (ref 4.6–6.5)

## 2017-12-15 MED ORDER — TRAMADOL HCL 50 MG PO TABS: 50.0000 mg | ORAL_TABLET | Freq: Two times a day (BID) | ORAL | 2 refills | Status: DC | PRN

## 2017-12-15 NOTE — Assessment & Plan Note (Signed)
Now off iron x 6 mo, for f/u repeat cbc and iron panel,  to f/u any worsening symptoms or concerns

## 2017-12-15 NOTE — Assessment & Plan Note (Signed)
>>  ASSESSMENT AND PLAN FOR HTN (HYPERTENSION) WRITTEN ON 12/15/2017  1:04 PM BY Corwin Levins, MD  stable overall by history and exam, recent data reviewed with pt, and pt to continue medical treatment as before,  to f/u any worsening symptoms or concerns

## 2017-12-15 NOTE — Assessment & Plan Note (Signed)
Stable, for f/u lab today 

## 2017-12-15 NOTE — Patient Instructions (Signed)
Please continue all other medications as before, and refills have been done if requested - the tramadol  Please have the pharmacy call with any other refills you may need.  Please continue your efforts at being more active, low cholesterol diet, and weight control.  Please keep your appointments with your specialists as you may have planned  Please go to the LAB in the Basement (turn left off the elevator) for the tests to be done today  You will be contacted by phone if any changes need to be made immediately.  Otherwise, you will receive a letter about your results with an explanation, but please check with MyChart first.  Please remember to sign up for MyChart if you have not done so, as this will be important to you in the future with finding out test results, communicating by private email, and scheduling acute appointments online when needed.  Please return in 6 months, or sooner if needed

## 2017-12-15 NOTE — Assessment & Plan Note (Signed)
stable overall by history and exam, recent data reviewed with pt, and pt to continue medical treatment as before,  to f/u any worsening symptoms or concerns  

## 2017-12-15 NOTE — Assessment & Plan Note (Signed)
Lab Results  Component Value Date   HGBA1C 6.0 12/15/2017  stable overall by history and exam, recent data reviewed with pt, and pt to continue medical treatment as before,  to f/u any worsening symptoms or concerns, for f/u a1c

## 2017-12-15 NOTE — Assessment & Plan Note (Signed)
stable overall by history and exam, and pt to continue medical treatment as before,  to f/u any worsening symptoms or concerns 

## 2017-12-15 NOTE — Progress Notes (Signed)
Subjective:    Patient ID: Alexandra Reyes, female    DOB: March 05, 1926, 84 y.o.   MRN: [id number]  HPI Here to f/u; overall doing ok,  Pt denies chest pain, increasing sob or doe, wheezing, orthopnea, PND, increased LE swelling, palpitations, dizziness or syncope.  Pt denies new neurological symptoms such as new headache, or facial or extremity weakness or numbness.  Pt denies polydipsia, polyuria, or low sugar episode.  Pt states overall good compliance with meds, mostly trying to follow appropriate diet, with wt overall stable, but less active recently due to several orthopedic issues.  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  Also had PT which may be helped.  Did have some lower back discomfort and 1/8 inch lift added to left shoe which has helped. Also had left lateral hip pain recently, seen per Dr Vanessa Kickamos/pain management , had xrays ls spine and hip (not included in our EMR) with reported severe arthritis, recommended for left hip THR, but she has deferred as wary of surgical risk.  Had cortisone topical per medicare requirement (did help somewhat and walks better) but most of pain seems persistent.  Has f/u appt soon.  Also known proximal left hamstring "tear".  Ibuprofen stopped per ortho, asks for tramadol bid prn refill.   Did stop her iron jan 2019, and asks for lab f/u.  Has new hearing aids working well.  Past Medical History:  Diagnosis Date  . Age-related macular degeneration, wet, both eyes (HCC)   . Alcohol dependence in remission (HCC) 05/22/2011  . Allergic rhinitis, cause unspecified 05/22/2011  . Allergy   . Anemia, iron deficiency 05/18/2011  . Cholelithiasis   . COPD (chronic obstructive pulmonary disease) (HCC)   . DDD (degenerative disc disease), lumbar 05/18/2011  . Depression    "@ times" (09/19/2016)  . First degree AV block   . GERD  (gastroesophageal reflux disease)   . Hiatal hernia   . History of blood transfusion    "3 or 4 related to childbirth"  . HTN (hypertension) 05/18/2011   pt denies this hx on 09/19/2016  . Hyperlipidemia 05/18/2011  . Macular degeneration   . Myocardial infarction (HCC)    "EKG shows I've had 2; date unknown" (09/19/2016)  . OA (osteoarthritis)    "a little here and there; mainly in the back" (09/19/2016)  . Osteoporosis 05/18/2011  . PAT (paroxysmal atrial tachycardia) (HCC)   . Pectus excavatum 05/18/2011  . Personal history of colonic polyps 10/28/2012  . Pneumonia    "as a child"  . SVT (supraventricular tachycardia) (HCC)   . Urinary frequency    Past Surgical History:  Procedure Laterality Date  . ABDOMINAL HYSTERECTOMY  1988  . BREAST BIOPSY Right 1948   "it was ok"  . CARDIAC CATHETERIZATION    . CARDIOVASCULAR STRESS TEST  03/08/2009   EF 84%  . CATARACT EXTRACTION W/ INTRAOCULAR LENS  IMPLANT, BILATERAL Bilateral   . INGUINAL HERNIA REPAIR Right 06/23/2017   Procedure: REPAIR INCARCERATED  RIGHT FEMORAL HERNIA WITH MESH;  Surgeon: Harriette Bouillonornett, Thomas, MD;  Location: [facility] OR;  Service: General;  Laterality: Right;  . KNEE ARTHROSCOPY Right   . RIGHT/LEFT HEART CATH AND CORONARY ANGIOGRAPHY N/A 07/25/2016   Procedure: Right/Left Heart Cath and Coronary Angiography;  Surgeon: Kathleene Hazelhristopher D McAlhany, MD;  [facility];  Service: Cardiovascular;  Laterality:  N/A;  . SVT ABLATION  09/19/2016  . SVT ABLATION N/A 09/19/2016   Procedure: SVT Ablation;  Surgeon: Hillis Range, MD;  Location: [facility] INVASIVE CV LAB;  Service: Cardiovascular;  Laterality: N/A;  . TONSILLECTOMY AND ADENOIDECTOMY    . US ECHOCARDIOGRAPHY  01/17/2003   EF 55-60%    reports that she quit smoking about 23 years ago. Her smoking use included cigarettes. She started smoking about 58 years ago. She has a 54.00 pack-year smoking history. She has never used smokeless tobacco. She reports that she does not drink  alcohol or use drugs. family history includes Arthritis in her mother; Heart disease in her father; Heart failure in her mother; Hypertension in her father; Kidney failure in her father. Allergies  Allergen Reactions  . Symbicort [Budesonide-Formoterol Fumarate]     Jittery in high doses, this has been reduced and is tolerated at this time.    Current Outpatient Medications on File Prior to Visit  Medication Sig Dispense Refill  . acetaminophen (TYLENOL) 500 MG tablet Take 1,000 mg by mouth 2 (two) times daily as needed for moderate pain or headache.    . ALPRAZolam (XANAX) 0.25 MG tablet TAKE ONE TABLET BY MOUTH AT BEDTIME AS NEEDED FOR ANXIETY 90 tablet 1  . amLODipine (NORVASC) 5 MG tablet TAKE ONE TABLET BY MOUTH DAILY 90 tablet 1  . Ascorbic Acid (VITAMIN C PO) Take 1 tablet by mouth daily. Reported on 08/08/2015    . aspirin 81 MG tablet Take 81 mg by mouth at bedtime.     . B Complex Vitamins (VITAMIN B COMPLEX PO) Take 1 tablet by mouth daily.     . calcium carbonate (OS-CAL) 600 MG tablet Take 600 mg by mouth daily.    . diclofenac sodium (VOLTAREN) 1 % GEL Apply 2 g topically 4 (four) times daily. 3 Tube 2  . diltiazem (CARDIZEM CD) 120 MG 24 hr capsule Take 120 mg by mouth daily.    Marland Kitchen diltiazem (CARDIZEM) 30 MG tablet Take 1 tablet (30 mg total) by mouth 4 (four) times daily as needed. 120 tablet 11  . docusate sodium (COLACE) 100 MG capsule Take 100 mg by mouth daily.     . furosemide (LASIX) 40 MG tablet TAKE ONE TABLET BY MOUTH DAILY 90 tablet 3  . gabapentin (NEURONTIN) 100 MG capsule TAKE ONE CAPSULE BY MOUTH THREE TIMES A DAY 90 capsule 2  . Glucosamine HCl-MSM (GLUCOSAMINE-MSM PO) Take 2 tablets by mouth daily.     . methocarbamol (ROBAXIN) 500 MG tablet Take 1 tablet (500 mg total) by mouth every 6 (six) hours as needed for muscle spasms. 30 tablet 0  . nitroGLYCERIN (NITROSTAT) 0.4 MG SL tablet Place 1 tablet (0.4 mg total) under the tongue every 5 (five) minutes as needed  for chest pain. 25 tablet 3  . omeprazole (PRILOSEC) 40 MG capsule TAKE ONE CAPSULE BY MOUTH DAILY 30 capsule 3  . PRESCRIPTION MEDICATION Apply 1 Dose to eye every 3 (three) months. Eylea - eye injection    . simvastatin (ZOCOR) 10 MG tablet TAKE ONE TABLET BY MOUTH EVERY EVENING AT 6:00 PM 90 tablet 2  . SYMBICORT 80-4.5 MCG/ACT inhaler INHALE 1 PUFF INTO THE LUNGS TWICE A DAY AS DIRECTED 1 Inhaler 5  . tobramycin (TOBREX) 0.3 % ophthalmic solution Place 1 drop into the right eye 4 (four) times daily. Use the day before, the day of, and the day after eye injections    . traMADol (ULTRAM) 50 MG  tablet Take 1 tablet (50 mg total) by mouth every 12 (twelve) hours as needed. 30 tablet 0  . triamcinolone cream (KENALOG) 0.1 % Apply 1 application topically daily as needed (hives). Apply to affected area     . vitamin E 200 UNIT capsule Take 200 Units by mouth daily.     No current facility-administered medications on file prior to visit.    Review of Systems  Constitutional: Negative for other unusual diaphoresis or sweats HENT: Negative for ear discharge or swelling Eyes: Negative for other worsening visual disturbances Respiratory: Negative for stridor or other swelling  Gastrointestinal: Negative for worsening distension or other blood Genitourinary: Negative for retention or other urinary change Musculoskeletal: Negative for other MSK pain or swelling Skin: Negative for color change or other new lesions Neurological: Negative for worsening tremors and other numbness  Psychiatric/Behavioral: Negative for worsening agitation or other fatigue All other system neg per pt    Objective:   Physical Exam BP 124/76   Pulse 70   Temp 98 F (36.7 C) (Oral)   Ht 5\' 3"  (1.6 m)   Wt 135 lb (61.2 kg)   LMP  (LMP Unknown)   SpO2 97%   BMI 23.91 kg/m  VS noted,  Constitutional: Pt appears in NAD HENT: Head: NCAT.  Right Ear: External ear normal.  Left Ear: External ear normal.  Eyes: .  Pupils are equal, round, and reactive to light. Conjunctivae and EOM are normal Nose: without d/c or deformity Neck: Neck supple. Gross normal ROM Cardiovascular: Normal rate and regular rhythm.   Pulmonary/Chest: Effort normal and breath sounds decreased without rales or wheezing.  Abd:  Soft, NT, ND, + BS, no organomegaly Neurological: Pt is alert. At baseline orientation, motor grossly intact Skin: Skin is warm. No rashes, other new lesions, no LE edema Psychiatric: Pt behavior is normal without agitation , mild nervous No other exam findings  EXAM: CT ABDOMEN AND PELVIS WITH CONTRAST  TECHNIQUE: Multidetector CT imaging of the abdomen and pelvis was performed using the standard protocol following bolus administration of intravenous contrast.  CONTRAST:  100 cc Isovue 300  COMPARISON:  None.  FINDINGS: Lower chest: Scarring lung bases. Mild pectus deformity. Heart slightly elongated. Aortic valve calcification.  Hepatobiliary: No worrisome hepatic lesion or calcified gallstones.  Pancreas: No pancreatic mass or inflammation.  Spleen: No splenic mass or enlargement.  Adrenals/Urinary Tract: No obstructing stone or hydronephrosis. Bilateral renal cyst. No adrenal lesion. Noncontrast filled views of the urinary bladder without abnormality noted.  Stomach/Bowel: Sigmoid diverticulosis. No extraluminal primary bowel inflammatory process.  Vascular/Lymphatic: Aortic atherosclerosis with ectasia without focal aneurysm. Atherosclerotic changes aortic branch vessels without large vessel occlusion. Mild dilation proximal celiac artery. Mild narrowing iliac arteries.  Reproductive: Post hysterectomy.  No adnexal mass noted.  Other: Post repair of right femoral hernia. 3.5 x 1.2 x 0.8 cm fluid collection superficial aspect of the hernia repair. This may represent expected resolving postoperative fluid although infection could not be excluded in proper clinical  setting. 3.6 x 2.6 x 2.4 cm slightly complex rounded fat collection deep to the hernia repair may represent expected finding versus inflamed fat collection. No recurrent bowel containing hernia. Surrounding reactive right inguinal lymph nodes incidentally noted.  Musculoskeletal: Scoliosis convex left with degenerative changes along the concavity.  IMPRESSION: Post repair of right femoral hernia. 3.5 x 1.2 x 0.8 cm fluid collection superficial aspect of the hernia repair. This may represent expected resolving postoperative fluid although infection could not be excluded  in proper clinical setting. 3.6 x 2.6 x 2.4 cm slightly complex rounded fat collection deep to the hernia repair may represent expected finding versus inflamed fat collection. No recurrent bowel containing hernia. Surrounding reactive right inguinal lymph nodes incidentally noted.  Aortic Atherosclerosis (ICD10-I70.0).   Electronically Signed   By: Lacy Duverney M.D.   On: 07/21/2017 09:33    Assessment & Plan:

## 2017-12-26 NOTE — Progress Notes (Addendum)
Subjective:   Alexandra Reyes is a 84 y.o. female who presents for Medicare Annual (Subsequent) preventive examination.  Review of Systems:  No ROS.  Medicare Wellness Visit. Additional risk factors are reflected in the social history.  Cardiac Risk Factors include: advanced age (>6355men, 33>65 women);hypertension Sleep patterns: gets up 1-2  times nightly to void and sleeps 6-7  hours nightly.    Home Safety/Smoke Alarms: Feels safe in home. Smoke alarms in place.  Living environment; residence and Firearm Safety: 1-story house/ trailer, equipment: Cane, Type: Single [employer] Cane and Miscellaneous Adaptive, Type: BSC, no firearms. Lives alone, no needs for DME, good support system Seat Belt Safety/Bike Helmet: Wears seat belt.      Objective:     Vitals: BP 132/80   Pulse 76   Ht 5\' 3"  (1.6 m)   Wt 135 lb (61.2 kg)   LMP  (LMP Unknown)   SpO2 97%   BMI 23.91 kg/m   Body mass index is 23.91 kg/m.  Advanced Directives 12/29/2017 06/24/2017 06/23/2017 12/25/2016 09/19/2016 09/19/2016 07/25/2016  Does Patient Have a Medical Advance Directive? Yes Yes Yes Yes Yes Yes Yes  Type of Estate agentAdvance Directive Healthcare Power of [place]Attorney;Living will Healthcare Power of [place]orney;Living will Healthcare Power of [place]orney;Living will Healthcare Power of [place]orney;Living will Healthcare Power of [place]orney;Living will Healthcare Power of [place]orney;Living will Healthcare Power of [place]orney;Living will  Does patient want to make changes to medical advance directive? - No - Patient declined - - No - Patient declined No - Patient declined No - Patient declined  Copy of Healthcare Power of Attorney in Chart? No - copy requested - - No - copy requested No - copy requested No - copy requested No - copy requested    Tobacco Social History   Tobacco Use  Smoking Status Former Smoker  . Packs/day: 1.50  . Years: 36.00  . Pack years: 54.00  . Types: Cigarettes  . Start date: 03/01/1959  . Last attempt to quit:  06/10/1994  . Years since quitting: 23.5  Smokeless Tobacco Never Used     Counseling given: Not Answered  Past Medical History:  Diagnosis Date  . Age-related macular degeneration, wet, both eyes (HCC)   . Alcohol dependence in remission (HCC) 05/22/2011  . Allergic rhinitis, cause unspecified 05/22/2011  . Allergy   . Anemia, iron deficiency 05/18/2011  . Cholelithiasis   . COPD (chronic obstructive pulmonary disease) (HCC)   . DDD (degenerative disc disease), lumbar 05/18/2011  . Depression    "@ times" (09/19/2016)  . First degree AV block   . GERD (gastroesophageal reflux disease)   . Hiatal hernia   . History of blood transfusion    "3 or 4 related to childbirth"  . HTN (hypertension) 05/18/2011   pt denies this hx on 09/19/2016  . Hyperlipidemia 05/18/2011  . Macular degeneration   . Myocardial infarction (HCC)    "EKG shows I've had 2; date unknown" (09/19/2016)  . OA (osteoarthritis)    "a little here and there; mainly in the back" (09/19/2016)  . Osteoporosis 05/18/2011  . PAT (paroxysmal atrial tachycardia) (HCC)   . Pectus excavatum 05/18/2011  . Personal history of colonic polyps 10/28/2012  . Pneumonia    "as a child"  . SVT (supraventricular tachycardia) (HCC)   . Urinary frequency    Past Surgical History:  Procedure Laterality Date  . ABDOMINAL HYSTERECTOMY  1988  . BREAST BIOPSY Right 1948   "it was ok"  . CARDIAC CATHETERIZATION    .  CARDIOVASCULAR STRESS TEST  03/08/2009   EF 84%  . CATARACT EXTRACTION W/ INTRAOCULAR LENS  IMPLANT, BILATERAL Bilateral   . INGUINAL HERNIA REPAIR Right 06/23/2017   Procedure: REPAIR INCARCERATED  RIGHT FEMORAL HERNIA WITH MESH;  Surgeon: Harriette Bouillon, MD;  Location: [facility] OR;  Service: General;  Laterality: Right;  . KNEE ARTHROSCOPY Right   . RIGHT/LEFT HEART CATH AND CORONARY ANGIOGRAPHY N/A 07/25/2016   Procedure: Right/Left Heart Cath and Coronary Angiography;  Surgeon: Kathleene Hazel, MD;  Location: [facility] INVASIVE CV  LAB;  Service: Cardiovascular;  Laterality: N/A;  . SVT ABLATION  09/19/2016  . SVT ABLATION N/A 09/19/2016   Procedure: SVT Ablation;  Surgeon: Hillis Range, MD;  Location: [facility] INVASIVE CV LAB;  Service: Cardiovascular;  Laterality: N/A;  . TONSILLECTOMY AND ADENOIDECTOMY    . US ECHOCARDIOGRAPHY  01/17/2003   EF 55-60%   Family History  Problem Relation Age of Onset  . Heart failure Mother   . Arthritis Mother   . Kidney failure Father   . Heart disease Father   . Hypertension Father    Social History   Socioeconomic History  . Marital status: Widowed    Spouse name: Not on file  . Number of children: 2  . Years of education: 70  . Highest education level: Not on file  Occupational History  . Occupation: Retired Designer, jewellery  Social Needs  . Financial resource strain: Not hard at all  . Food insecurity:    Worry: Never true    Inability: Never true  . Transportation needs:    Medical: No    Non-medical: No  Tobacco Use  . Smoking status: Former Smoker    Packs/day: 1.50    Years: 36.00    Pack years: 54.00    Types: Cigarettes    Start date: 03/01/1959    Last attempt to quit: 06/10/1994    Years since quitting: 23.5  . Smokeless tobacco: Never Used  Substance and Sexual Activity  . Alcohol use: No    Frequency: Never  . Drug use: No  . Sexual activity: Never  Lifestyle  . Physical activity:    Days per week: 0 days    Minutes per session: 0 min  . Stress: Not at all  Relationships  . Social connections:    Talks on phone: More than three times a week    Gets together: More than three times a week    Attends religious service: More than 4 times per year    Active member of club or organization: Yes    Attends meetings of clubs or organizations: More than 4 times per year    Relationship status: Widowed  Other Topics Concern  . Not on file  Social History Narrative   Originally from [place], [place]. She moved to [place] in 1950 during the polio epidemic.  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 Remote bird exposure. No mold or hot tub exposure.     Outpatient Encounter Medications as of 12/29/2017  Medication Sig  . acetaminophen (TYLENOL) 500 MG tablet Take 1,000 mg by mouth 2 (two) times daily as needed for moderate pain or headache.  . ALPRAZolam (XANAX) 0.25 MG tablet TAKE ONE TABLET BY MOUTH AT BEDTIME AS NEEDED FOR ANXIETY  . amLODipine (NORVASC) 5 MG tablet TAKE ONE TABLET BY MOUTH DAILY  . Ascorbic Acid (VITAMIN C PO) Take 1 tablet by mouth daily. Reported on 08/08/2015  . aspirin 81 MG tablet Take 81  mg by mouth at bedtime.   . B Complex Vitamins (VITAMIN B COMPLEX PO) Take 1 tablet by mouth daily.   . calcium carbonate (OS-CAL) 600 MG tablet Take 600 mg by mouth daily.  . diclofenac sodium (VOLTAREN) 1 % GEL Apply 2 g topically 4 (four) times daily.  Marland Kitchen diltiazem (CARDIZEM CD) 120 MG 24 hr capsule Take 120 mg by mouth daily.  Marland Kitchen diltiazem (CARDIZEM) 30 MG tablet Take 1 tablet (30 mg total) by mouth 4 (four) times daily as needed.  . docusate sodium (COLACE) 100 MG capsule Take 100 mg by mouth daily.   . furosemide (LASIX) 40 MG tablet TAKE ONE TABLET BY MOUTH DAILY  . gabapentin (NEURONTIN) 100 MG capsule TAKE ONE CAPSULE BY MOUTH THREE TIMES A DAY  . Glucosamine HCl-MSM (GLUCOSAMINE-MSM PO) Take 2 tablets by mouth daily.   . methocarbamol (ROBAXIN) 500 MG tablet Take 1 tablet (500 mg total) by mouth every 6 (six) hours as needed for muscle spasms.  . nitroGLYCERIN (NITROSTAT) 0.4 MG SL tablet Place 1 tablet (0.4 mg total) under the tongue every 5 (five) minutes as needed for chest pain.  Marland Kitchen omeprazole (PRILOSEC) 40 MG capsule TAKE ONE CAPSULE BY MOUTH DAILY  . PRESCRIPTION MEDICATION Apply 1 Dose to eye every 3 (three) months. Eylea - eye injection  . simvastatin (ZOCOR) 10 MG tablet TAKE ONE TABLET BY MOUTH EVERY EVENING AT 6:00 PM  . SYMBICORT 80-4.5 MCG/ACT inhaler INHALE 1 PUFF INTO THE LUNGS TWICE A DAY AS DIRECTED  .  tobramycin (TOBREX) 0.3 % ophthalmic solution Place 1 drop into the right eye 4 (four) times daily. Use the day before, the day of, and the day after eye injections  . traMADol (ULTRAM) 50 MG tablet Take 1 tablet (50 mg total) by mouth 2 (two) times daily as needed.  . triamcinolone cream (KENALOG) 0.1 % Apply 1 application topically daily as needed (hives). Apply to affected area   . vitamin E 200 UNIT capsule Take 200 Units by mouth daily.   No facility-administered encounter medications on file as of 12/29/2017.     Activities of Daily Living In your present state of health, do you have any difficulty performing the following activities: 12/29/2017 06/24/2017  Hearing? Y N  Vision? N N  Difficulty concentrating or making decisions? N N  Walking or climbing stairs? N N  Dressing or bathing? N N  Doing errands, shopping? N N  Preparing Food and eating ? N -  Using the Toilet? N -  In the past six months, have you accidently leaked urine? Y -  Do you have problems with loss of bowel control? Y -  Managing your Medications? N -  Managing your Finances? N -  Housekeeping or managing your Housekeeping? N -  Some recent data might be hidden    Patient Care Team: Corwin Levins, MD as PCP - General (Internal Medicine) Cassell Clement, MD as Consulting Physician (Cardiology) Roslynn Amble, MD as Consulting Physician (Pulmonary Disease) Hillis Range, MD as Consulting Physician (Cardiology) Kathleene Hazel, MD as Consulting Physician (Cardiology) Evette Doffing, MD as Referring Physician (Orthopedic Surgery) Jake Bathe, MD as Consulting Physician (Cardiology) Hilarie Fredrickson, MD as Consulting Physician (Gastroenterology)    Assessment:   This is a routine wellness examination for [place]. Physical assessment deferred to PCP.   Exercise Activities and Dietary recommendations Current Exercise Habits: The patient does not participate in regular exercise at  present, Exercise limited by: orthopedic  condition(s) Diet (meal preparation, eat out, water intake, caffeinated beverages, dairy products, fruits and vegetables): in general, a "healthy" diet  , well balanced   Reviewed heart healthy diet. Encouraged patient to increase daily water and fluid intake. Discussed supplementing with ensure (samples and coupons were provided)  Goals    . Patient Stated     Stay as healthy and as independent as possible. Continue to be as active physically and socially.       Fall Risk Fall Risk  12/29/2017 11/10/2017 09/22/2017 08/26/2017 08/26/2017  Falls in the past year? No No No No No  Number falls in past yr: - - - - -  Injury with Fall? - - - - -  Risk for fall due to : Impaired balance/gait - - - -    Depression Screen PHQ 2/9 Scores 12/29/2017 11/10/2017 09/22/2017 08/26/2017  PHQ - 2 Score 1 0 0 0  PHQ- 9 Score 4 - - -     Cognitive Function MMSE - Mini Mental State Exam 12/29/2017 12/25/2016  Orientation to time 5 5  Orientation to Place 5 5  Registration 3 3  Attention/ Calculation 4 5  Recall 1 2  Language- name 2 objects 2 2  Language- repeat 1 1  Language- follow 3 step command 3 3  Language- read & follow direction 1 1  Write a sentence 1 1  Copy design 1 1  Total score 27 29        Immunization History  Administered Date(s) Administered  . Influenza, High Dose Seasonal PF 05/08/2016, 03/10/2017  . Influenza, Seasonal, Injecte, Preservative Fre 03/10/2013  . Influenza-Unspecified 03/10/2014, 03/11/2015  . Pneumococcal Conjugate-13 05/10/2005, 06/30/2013  . Pneumococcal Polysaccharide-23 06/10/1996  . Tetanus 10/28/2012  . Zoster 05/10/2008   Screening Tests Health Maintenance  Topic Date Due  . INFLUENZA VACCINE  01/08/2018  . TETANUS/TDAP  10/29/2022  . DEXA SCAN  Completed  . PNA vac Low Risk Adult  Completed      Plan:     Continue doing brain stimulating activities (puzzles, reading, adult coloring books, staying  active) to keep memory sharp.   Continue to eat heart healthy diet (full of fruits, vegetables, whole grains, lean protein, water--limit salt, fat, and sugar intake) and increase physical activity as tolerated.  I have personally reviewed and noted the following in the patient's chart:   . Medical and social history . Use of alcohol, tobacco or illicit drugs  . Current medications and supplements . Functional ability and status . Nutritional status . Physical activity . Advanced directives . List of other physicians . Vitals . Screenings to include cognitive, depression, and falls . Referrals and appointments  In addition, I have reviewed and discussed with patient certain preventive protocols, quality metrics, and best practice recommendations. A written personalized care plan for preventive services as well as general preventive health recommendations were provided to patient.     Wanda Plump, RN  12/29/2017  Medical screening examination/treatment/procedure(s) were performed by non-physician practitioner and as supervising provider I was immediately available for consultation/collaboration.  I agree with above. Olive Bass, FNP

## 2017-12-29 ENCOUNTER — Ambulatory Visit (INDEPENDENT_AMBULATORY_CARE_PROVIDER_SITE_OTHER): Payer: Medicare Other | Admitting: *Deleted

## 2017-12-29 VITALS — BP 132/80 | HR 76 | Ht 63.0 in | Wt 135.0 lb

## 2017-12-29 DIAGNOSIS — Z0000 Encounter for general adult medical examination without abnormal findings: Secondary | ICD-10-CM | POA: Diagnosis not present

## 2017-12-29 NOTE — Patient Instructions (Addendum)
Continue doing brain stimulating activities (puzzles, reading, adult coloring books, staying active) to keep memory sharp.   Continue to eat heart healthy diet (full of fruits, vegetables, whole grains, lean protein, water--limit salt, fat, and sugar intake) and increase physical activity as tolerated.  Alexandra Reyes , Thank you for taking time to come for your Medicare Wellness Visit. I appreciate your ongoing commitment to your health goals. Please review the following plan we discussed and let me know if I can assist you in the future.   These are the goals we discussed: Goals    . Patient Stated     Stay as healthy and as independent as possible. Continue to be as active physically and socially.       This is a list of the screening recommended for you and due dates:  Health Maintenance  Topic Date Due  . Flu Shot  01/08/2018  . Tetanus Vaccine  10/29/2022  . DEXA scan (bone density measurement)  Completed  . Pneumonia vaccines  Completed   Health Maintenance, Female Adopting a healthy lifestyle and getting preventive care can go a long way to promote health and wellness. Talk with your health care provider about what schedule of regular examinations is right for you. This is a good chance for you to check in with your provider about disease prevention and staying healthy. In between checkups, there are plenty of things you can do on your own. Experts have done a lot of research about which lifestyle changes and preventive measures are most likely to keep you healthy. Ask your health care provider for more information. Weight and diet Eat a healthy diet  Be sure to include plenty of vegetables, fruits, low-fat dairy products, and lean protein.  Do not eat a lot of foods high in solid fats, added sugars, or salt.  Get regular exercise. This is one of the most important things you can do for your health. ? Most adults should exercise for at least 150 minutes each week. The exercise  should increase your heart rate and make you sweat (moderate-intensity exercise). ? Most adults should also do strengthening exercises at least twice a week. This is in addition to the moderate-intensity exercise.  Maintain a healthy weight  Body mass index (BMI) is a measurement that can be used to identify possible weight problems. It estimates body fat based on height and weight. Your health care provider can help determine your BMI and help you achieve or maintain a healthy weight.  For females 55 years of age and older: ? A BMI below 18.5 is considered underweight. ? A BMI of 18.5 to 24.9 is normal. ? A BMI of 25 to 29.9 is considered overweight. ? A BMI of 30 and above is considered obese.  Watch levels of cholesterol and blood lipids  You should start having your blood tested for lipids and cholesterol at 82 years of age, then have this test every 5 years.  You may need to have your cholesterol levels checked more often if: ? Your lipid or cholesterol levels are high. ? You are older than 82 years of age. ? You are at high risk for heart disease.  Cancer screening Lung Cancer  Lung cancer screening is recommended for adults 81-65 years old who are at high risk for lung cancer because of a history of smoking.  A yearly low-dose CT scan of the lungs is recommended for people who: ? Currently smoke. ? Have quit within the past 15  years. ? Have at least a 30-pack-year history of smoking. A pack year is smoking an average of one pack of cigarettes a day for 1 year.  Yearly screening should continue until it has been 15 years since you quit.  Yearly screening should stop if you develop a health problem that would prevent you from having lung cancer treatment.  Breast Cancer  Practice breast self-awareness. This means understanding how your breasts normally appear and feel.  It also means doing regular breast self-exams. Let your health care provider know about any changes, no  matter how small.  If you are in your 20s or 30s, you should have a clinical breast exam (CBE) by a health care provider every 1-3 years as part of a regular health exam.  If you are 72 or older, have a CBE every year. Also consider having a breast X-ray (mammogram) every year.  If you have a family history of breast cancer, talk to your health care provider about genetic screening.  If you are at high risk for breast cancer, talk to your health care provider about having an MRI and a mammogram every year.  Breast cancer gene (BRCA) assessment is recommended for women who have family members with BRCA-related cancers. BRCA-related cancers include: ? Breast. ? Ovarian. ? Tubal. ? Peritoneal cancers.  Results of the assessment will determine the need for genetic counseling and BRCA1 and BRCA2 testing.  Cervical Cancer Your health care provider may recommend that you be screened regularly for cancer of the pelvic organs (ovaries, uterus, and vagina). This screening involves a pelvic examination, including checking for microscopic changes to the surface of your cervix (Pap test). You may be encouraged to have this screening done every 3 years, beginning at age 35.  For women ages 38-65, health care providers may recommend pelvic exams and Pap testing every 3 years, or they may recommend the Pap and pelvic exam, combined with testing for human papilloma virus (HPV), every 5 years. Some types of HPV increase your risk of cervical cancer. Testing for HPV may also be done on women of any age with unclear Pap test results.  Other health care providers may not recommend any screening for nonpregnant women who are considered low risk for pelvic cancer and who do not have symptoms. Ask your health care provider if a screening pelvic exam is right for you.  If you have had past treatment for cervical cancer or a condition that could lead to cancer, you need Pap tests and screening for cancer for at least  20 years after your treatment. If Pap tests have been discontinued, your risk factors (such as having a new sexual partner) need to be reassessed to determine if screening should resume. Some women have medical problems that increase the chance of getting cervical cancer. In these cases, your health care provider may recommend more frequent screening and Pap tests.  Colorectal Cancer  This type of cancer can be detected and often prevented.  Routine colorectal cancer screening usually begins at 82 years of age and continues through 82 years of age.  Your health care provider may recommend screening at an earlier age if you have risk factors for colon cancer.  Your health care provider may also recommend using home test kits to check for hidden blood in the stool.  A small camera at the end of a tube can be used to examine your colon directly (sigmoidoscopy or colonoscopy). This is done to check for the earliest forms  of colorectal cancer.  Routine screening usually begins at age 76.  Direct examination of the colon should be repeated every 5-10 years through 82 years of age. However, you may need to be screened more often if early forms of precancerous polyps or small growths are found.  Skin Cancer  Check your skin from head to toe regularly.  Tell your health care provider about any new moles or changes in moles, especially if there is a change in a mole's shape or color.  Also tell your health care provider if you have a mole that is larger than the size of a pencil eraser.  Always use sunscreen. Apply sunscreen liberally and repeatedly throughout the day.  Protect yourself by wearing long sleeves, pants, a wide-brimmed hat, and sunglasses whenever you are outside.  Heart disease, diabetes, and high blood pressure  High blood pressure causes heart disease and increases the risk of stroke. High blood pressure is more likely to develop in: ? People who have blood pressure in the  high end of the normal range (130-139/85-89 mm Hg). ? People who are overweight or obese. ? People who are African American.  If you are 42-38 years of age, have your blood pressure checked every 3-5 years. If you are 67 years of age or older, have your blood pressure checked every year. You should have your blood pressure measured twice-once when you are at a hospital or clinic, and once when you are not at a hospital or clinic. Record the average of the two measurements. To check your blood pressure when you are not at a hospital or clinic, you can use: ? An automated blood pressure machine at a pharmacy. ? A home blood pressure monitor.  If you are between 9 years and 23 years old, ask your health care provider if you should take aspirin to prevent strokes.  Have regular diabetes screenings. This involves taking a blood sample to check your fasting blood sugar level. ? If you are at a normal weight and have a low risk for diabetes, have this test once every three years after 82 years of age. ? If you are overweight and have a high risk for diabetes, consider being tested at a younger age or more often. Preventing infection Hepatitis B  If you have a higher risk for hepatitis B, you should be screened for this virus. You are considered at high risk for hepatitis B if: ? You were born in a country where hepatitis B is common. Ask your health care provider which countries are considered high risk. ? Your parents were born in a high-risk country, and you have not been immunized against hepatitis B (hepatitis B vaccine). ? You have HIV or AIDS. ? You use needles to inject street drugs. ? You live with someone who has hepatitis B. ? You have had sex with someone who has hepatitis B. ? You get hemodialysis treatment. ? You take certain medicines for conditions, including cancer, organ transplantation, and autoimmune conditions.  Hepatitis C  Blood testing is recommended for: ? Everyone born  from [birth year] through 1963. ? Anyone with known risk factors for hepatitis C.  Sexually transmitted infections (STIs)  You should be screened for sexually transmitted infections (STIs) including gonorrhea and chlamydia if: ? You are sexually active and are younger than 82 years of age. ? You are older than 82 years of age and your health care provider tells you that you are at risk for this type of infection. ?  Your sexual activity has changed since you were last screened and you are at an increased risk for chlamydia or gonorrhea. Ask your health care provider if you are at risk.  If you do not have HIV, but are at risk, it may be recommended that you take a prescription medicine daily to prevent HIV infection. This is called pre-exposure prophylaxis (PrEP). You are considered at risk if: ? You are sexually active and do not regularly use condoms or know the HIV status of your partner(s). ? You take drugs by injection. ? You are sexually active with a partner who has HIV.  Talk with your health care provider about whether you are at high risk of being infected with HIV. If you choose to begin PrEP, you should first be tested for HIV. You should then be tested every 3 months for as long as you are taking PrEP. Pregnancy  If you are premenopausal and you may become pregnant, ask your health care provider about preconception counseling.  If you may become pregnant, take 400 to 800 micrograms (mcg) of folic acid every day.  If you want to prevent pregnancy, talk to your health care provider about birth control (contraception). Osteoporosis and menopause  Osteoporosis is a disease in which the bones lose minerals and strength with aging. This can result in serious bone fractures. Your risk for osteoporosis can be identified using a bone density scan.  If you are 19 years of age or older, or if you are at risk for osteoporosis and fractures, ask your health care provider if you should be  screened.  Ask your health care provider whether you should take a calcium or vitamin D supplement to lower your risk for osteoporosis.  Menopause may have certain physical symptoms and risks.  Hormone replacement therapy may reduce some of these symptoms and risks. Talk to your health care provider about whether hormone replacement therapy is right for you. Follow these instructions at home:  Schedule regular health, dental, and eye exams.  Stay current with your immunizations.  Do not use any tobacco products including cigarettes, chewing tobacco, or electronic cigarettes.  If you are pregnant, do not drink alcohol.  If you are breastfeeding, limit how much and how often you drink alcohol.  Limit alcohol intake to no more than 1 drink per day for nonpregnant women. One drink equals 12 ounces of beer, 5 ounces of Annet Manukyan, or 1 ounces of hard liquor.  Do not use street drugs.  Do not share needles.  Ask your health care provider for help if you need support or information about quitting drugs.  Tell your health care provider if you often feel depressed.  Tell your health care provider if you have ever been abused or do not feel safe at home. This information is not intended to replace advice given to you by your health care provider. Make sure you discuss any questions you have with your health care provider. Document Released: 12/10/2010 Document Revised: 11/02/2015 Document Reviewed: 02/28/2015 Elsevier Interactive Patient Education  [employer].  It is important to avoid accidents which may result in broken bones.  Here are a few ideas on how to make your home safer so you will be less likely to trip or fall.  1. Use nonskid mats or non slip strips in your shower or tub, on your bathroom floor and around sinks.  If you know that you have spilled water, wipe it up! 2. In the bathroom, it is important  to have properly installed grab bars on the walls or on the edge of the  tub.  Towel racks are NOT strong enough for you to hold onto or to pull on for support. 3. Stairs and hallways should have enough light.  Add lamps or night lights if you need ore light. 4. It is good to have handrails on both sides of the stairs if possible.  Always fix broken handrails right away. 5. It is important to see the edges of steps.  Paint the edges of outdoor steps white so you can see them better.  Put colored tape on the edge of inside steps. 6. Throw-rugs are dangerous because they can slide.  Removing the rugs is the best idea, but if they must stay, add adhesive carpet tape to prevent slipping. 7. Do not keep things on stairs or in the halls.  Remove small furniture that blocks the halls as it may cause you to trip.  Keep telephone and electrical cords out of the way where you walk. 8. Always were sturdy, rubber-soled shoes for good support.  Never wear just socks, especially on the stairs.  Socks may cause you to slip or fall.  Do not wear full-length housecoats as you can easily trip on the bottom.  9. Place the things you use the most on the shelves that are the easiest to reach.  If you use a stepstool, make sure it is in good condition.  If you feel unsteady, DO NOT climb, ask for help. 10. If a health professional advises you to use a cane or walker, do not be ashamed.  These items can keep you from falling and breaking your bones.

## 2018-01-05 DIAGNOSIS — N816 Rectocele: Secondary | ICD-10-CM | POA: Diagnosis not present

## 2018-01-23 ENCOUNTER — Other Ambulatory Visit: Payer: Self-pay | Admitting: Internal Medicine

## 2018-01-23 MED ORDER — TRIAMCINOLONE ACETONIDE 0.1 % EX CREA: 1.0000 "application " | TOPICAL_CREAM | Freq: Every day | CUTANEOUS | 0 refills | Status: DC | PRN

## 2018-01-23 NOTE — Telephone Encounter (Signed)
Copied from CRM [phone]. Topic: Quick Communication - See Telephone Encounter >> Jan 23, 2018 10:24 AM Mare LoanBurton, Donna F wrote: Pt is needing a refill on triamcinolone cream   [facility]   Best [phone]

## 2018-01-23 NOTE — Telephone Encounter (Signed)
Rx refill request: triamcinolone cream 0.1  Historical provider    Last ordered: 07/10/15  LOV: 12/15/17  PCP: Jonny RuizJohn  Pharmacy: verified

## 2018-01-27 ENCOUNTER — Other Ambulatory Visit: Payer: Self-pay | Admitting: Cardiology

## 2018-01-28 DIAGNOSIS — F4329 Adjustment disorder with other symptoms: Secondary | ICD-10-CM | POA: Diagnosis not present

## 2018-02-16 ENCOUNTER — Ambulatory Visit (INDEPENDENT_AMBULATORY_CARE_PROVIDER_SITE_OTHER): Payer: Medicare Other | Admitting: Internal Medicine

## 2018-02-16 ENCOUNTER — Encounter: Payer: Self-pay | Admitting: Internal Medicine

## 2018-02-16 ENCOUNTER — Encounter: Payer: Medicare Other | Attending: Physical Medicine & Rehabilitation

## 2018-02-16 ENCOUNTER — Encounter: Payer: Self-pay | Admitting: Physical Medicine & Rehabilitation

## 2018-02-16 ENCOUNTER — Ambulatory Visit (HOSPITAL_BASED_OUTPATIENT_CLINIC_OR_DEPARTMENT_OTHER): Payer: Medicare Other | Admitting: Physical Medicine & Rehabilitation

## 2018-02-16 VITALS — BP 128/77 | HR 78 | Resp 14 | Ht 63.0 in | Wt 137.0 lb

## 2018-02-16 VITALS — BP 126/64 | HR 71 | Ht 63.0 in | Wt 137.4 lb

## 2018-02-16 DIAGNOSIS — M792 Neuralgia and neuritis, unspecified: Secondary | ICD-10-CM | POA: Insufficient documentation

## 2018-02-16 DIAGNOSIS — I10 Essential (primary) hypertension: Secondary | ICD-10-CM

## 2018-02-16 DIAGNOSIS — R079 Chest pain, unspecified: Secondary | ICD-10-CM | POA: Diagnosis not present

## 2018-02-16 DIAGNOSIS — I471 Supraventricular tachycardia: Secondary | ICD-10-CM

## 2018-02-16 DIAGNOSIS — M940 Chondrocostal junction syndrome [Tietze]: Secondary | ICD-10-CM

## 2018-02-16 DIAGNOSIS — Z9889 Other specified postprocedural states: Secondary | ICD-10-CM | POA: Insufficient documentation

## 2018-02-16 DIAGNOSIS — G588 Other specified mononeuropathies: Secondary | ICD-10-CM | POA: Diagnosis not present

## 2018-02-16 DIAGNOSIS — M6281 Muscle weakness (generalized): Secondary | ICD-10-CM | POA: Diagnosis not present

## 2018-02-16 MED ORDER — GABAPENTIN 100 MG PO CAPS: 100.0000 mg | ORAL_CAPSULE | Freq: Three times a day (TID) | ORAL | 2 refills | Status: DC

## 2018-02-16 NOTE — Progress Notes (Signed)
Subjective:    Patient ID: Alexandra Reyes, female    DOB: July 09, 1925, 84 y.o.   MRN: [id number]  HPI RIght intercostal /Costochondritis pain overall improved , weaning down on Gabapentin 100mg  No longer using diclofenac but does use Salon Pas patch  Seen by PMR Dr Ethelene Hal for Left hip pain.  Xray showed osteoarthritis L>R hip Had injection June 2019 under xray guidance  The patient remains active she is independent with all her self-care and mobility.  She does yard work as well. Pain Inventory Average Pain 8 Pain Right Now 8 My pain is aching  In the last 24 hours, has pain interfered with the following? General activity 4 Relation with others 0 Enjoyment of life 4 What TIME of day is your pain at its worst? daytime Sleep (in general) Fair  Pain is worse with: some activites Pain improves with: rest and medication Relief from Meds: ?  Mobility walk with assistance use a cane ability to climb steps?  yes do you drive?  yes Do you have any goals in this area?  yes  Function retired  Neuro/Psych trouble walking  Prior Studies Any changes since last visit?  no  Physicians involved in your care Any changes since last visit?  no   Family History  Problem Relation Age of Onset  . Heart failure Mother   . Arthritis Mother   . Kidney failure Father   . Heart disease Father   . Hypertension Father    Social History   Socioeconomic History  . Marital status: Widowed    Spouse name: Not on file  . Number of children: 2  . Years of education: 62  . Highest education level: Not on file  Occupational History  . Occupation: Retired Designer, jewellery  Social Needs  . Financial resource strain: Not hard at all  . Food insecurity:    Worry: Never true    Inability: Never true  . Transportation needs:    Medical: No    Non-medical: No  Tobacco Use  . Smoking status: Former Smoker    Packs/day: 1.50    Years: 36.00    Pack years: 54.00    Types: Cigarettes      Start date: 03/01/1959    Last attempt to quit: 06/10/1994    Years since quitting: 23.7  . Smokeless tobacco: Never Used  Substance and Sexual Activity  . Alcohol use: No    Frequency: Never  . Drug use: No  . Sexual activity: Never  Lifestyle  . Physical activity:    Days per week: 0 days    Minutes per session: 0 min  . Stress: Not at all  Relationships  . Social connections:    Talks on phone: More than three times a week    Gets together: More than three times a week    Attends religious service: More than 4 times per year    Active member of club or organization: Yes    Attends meetings of clubs or organizations: More than 4 times per year    Relationship status: Widowed  Other Topics Concern  . Not on file  Social History Narrative   Originally from [place], [place]. She moved to [place] in 1950 during the polio epidemic. 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 Remote bird exposure. No mold or hot tub exposure.    Past Surgical History:  Procedure Laterality Date  . ABDOMINAL HYSTERECTOMY  1988  . BREAST BIOPSY Right 1948   "  it was ok"  . CARDIAC CATHETERIZATION    . CARDIOVASCULAR STRESS TEST  03/08/2009   EF 84%  . CATARACT EXTRACTION W/ INTRAOCULAR LENS  IMPLANT, BILATERAL Bilateral   . INGUINAL HERNIA REPAIR Right 06/23/2017   Procedure: REPAIR INCARCERATED  RIGHT FEMORAL HERNIA WITH MESH;  Surgeon: Harriette Bouillon, MD;  Location: [facility] OR;  Service: General;  Laterality: Right;  . KNEE ARTHROSCOPY Right   . RIGHT/LEFT HEART CATH AND CORONARY ANGIOGRAPHY N/A 07/25/2016   Procedure: Right/Left Heart Cath and Coronary Angiography;  Surgeon: Kathleene Hazel, MD;  Location[facility] INVASIVE CV LAB;  Service: Cardiovascular;  Laterality: N/A;  . SVT ABLATION  09/19/2016  . SVT ABLATION N/A 09/19/2016   Procedure: SVT Ablation;  Surgeon: Hillis Range, MD;  Location: [facility] INVASIVE CV LAB;  Service: Cardiovascular;  Laterality: N/A;  . TONSILLECTOMY AND ADENOIDECTOMY    .  US ECHOCARDIOGRAPHY  01/17/2003   EF 55-60%   Past Medical History:  Diagnosis Date  . Age-related macular degeneration, wet, both eyes (HCC)   . Alcohol dependence in remission (HCC) 05/22/2011  . Allergic rhinitis, cause unspecified 05/22/2011  . Allergy   . Anemia, iron deficiency 05/18/2011  . Cholelithiasis   . COPD (chronic obstructive pulmonary disease) (HCC)   . DDD (degenerative disc disease), lumbar 05/18/2011  . Depression    "@ times" (09/19/2016)  . First degree AV block   . GERD (gastroesophageal reflux disease)   . Hiatal hernia   . History of blood transfusion    "3 or 4 related to childbirth"  . HTN (hypertension) 05/18/2011   pt denies this hx on 09/19/2016  . Hyperlipidemia 05/18/2011  . Macular degeneration   . Myocardial infarction (HCC)    "EKG shows I've had 2; date unknown" (09/19/2016)  . OA (osteoarthritis)    "a little here and there; mainly in the back" (09/19/2016)  . Osteoporosis 05/18/2011  . PAT (paroxysmal atrial tachycardia) (HCC)   . Pectus excavatum 05/18/2011  . Personal history of colonic polyps 10/28/2012  . Pneumonia    "as a child"  . SVT (supraventricular tachycardia) (HCC)   . Urinary frequency    BP 128/77   Pulse 78   Resp 14   Ht 5\' 3"  (1.6 m)   Wt 137 lb (62.1 kg)   LMP  (LMP Unknown)   SpO2 96%   BMI 24.27 kg/m   Opioid Risk Score:   Fall Risk Score:  `1  Depression screen PHQ 2/9  Depression screen [facility] 2/9 12/29/2017 11/10/2017 09/22/2017 08/26/2017 07/04/2017 12/25/2016 09/11/2016  Decreased Interest 0 0 0 0 0 0 0  Down, Depressed, Hopeless 1 0 0 0 1 1 0  PHQ - 2 Score 1 0 0 0 1 1 0  Altered sleeping 1 - - - - 1 -  Tired, decreased energy 1 - - - - 1 -  Change in appetite 1 - - - - 1 -  Feeling bad or failure about yourself  0 - - - - 0 -  Trouble concentrating 0 - - - - 0 -  Moving slowly or fidgety/restless 0 - - - - 0 -  Suicidal thoughts - - - - - 0 -  PHQ-9 Score 4 - - - - 4 -  Difficult doing work/chores Not difficult  at all - - - - Not difficult at all -    Review of Systems  Constitutional: Negative.   HENT: Negative.   Eyes: Negative.   Respiratory: Positive for  shortness of breath.   Cardiovascular: Negative.   Gastrointestinal: Negative.   Endocrine: Negative.   Genitourinary: Negative.   Musculoskeletal: Positive for arthralgias, back pain and gait problem.  Skin: Negative.   Allergic/Immunologic: Negative.   Hematological: Negative.   Psychiatric/Behavioral: Negative.   All other systems reviewed and are negative.      Objective:   Physical Exam  Constitutional: She is oriented to person, place, and time. She appears well-developed and well-nourished.  HENT:  Head: Normocephalic and atraumatic.  Eyes: Pupils are equal, round, and reactive to light. EOM are normal.  Neurological: She is alert and oriented to person, place, and time.  Skin: Skin is warm and dry.  Nursing note and vitals reviewed. Mild tenderness palpation subcostal area on the right side. There is no tenderness over the hip area no pain with hip internal/external rotation Ambulates with a cane no evidence of toe drag or knee instability. Negative straight leg raising       Assessment & Plan:  #1.  Right intercostal neuralgia overall improved, she is using over-the-counter patches instead of the Voltaren gel at this point.  If her pain worsens she can go back to the Voltaren gel.  Continue gabapentin 100 mg 3 times daily although she sometimes takes this less often but primarily likes to take it at night since it helps her sleep.  I will see the patient back in 6 months  2.  Left hip pain secondary osteoarthritis good improvement after fluoroscopically guided left intra-articular hip injection per Dr. Ethelene Hal.  She will follow-up with him on this problem.

## 2018-02-16 NOTE — Progress Notes (Signed)
PCP: Corwin Levins, MD Primary Cardiologist: Dr Anne Fu Primary EP: Dr Johney Frame  Alexandra Reyes is a 84 y.o. female who presents today for routine electrophysiology followup.  Since last being seen in our clinic, the patient reports doing reasonably well.  Continues to have chest wall pain.  No arrhythmias since I saw her last.  Today, she denies symptoms of palpitations, exertional chest pain, shortness of breath,  lower extremity edema, dizziness, presyncope, or syncope.  The patient is otherwise without complaint today.   Past Medical History:  Diagnosis Date  . Age-related macular degeneration, wet, both eyes (HCC)   . Alcohol dependence in remission (HCC) 05/22/2011  . Allergic rhinitis, cause unspecified 05/22/2011  . Allergy   . Anemia, iron deficiency 05/18/2011  . Cholelithiasis   . COPD (chronic obstructive pulmonary disease) (HCC)   . DDD (degenerative disc disease), lumbar 05/18/2011  . Depression    "@ times" (09/19/2016)  . First degree AV block   . GERD (gastroesophageal reflux disease)   . Hiatal hernia   . History of blood transfusion    "3 or 4 related to childbirth"  . HTN (hypertension) 05/18/2011   pt denies this hx on 09/19/2016  . Hyperlipidemia 05/18/2011  . Macular degeneration   . Myocardial infarction (HCC)    "EKG shows I've had 2; date unknown" (09/19/2016)  . OA (osteoarthritis)    "a little here and there; mainly in the back" (09/19/2016)  . Osteoporosis 05/18/2011  . PAT (paroxysmal atrial tachycardia) (HCC)   . Pectus excavatum 05/18/2011  . Personal history of colonic polyps 10/28/2012  . Pneumonia    "as a child"  . SVT (supraventricular tachycardia) (HCC)   . Urinary frequency    Past Surgical History:  Procedure Laterality Date  . ABDOMINAL HYSTERECTOMY  1988  . BREAST BIOPSY Right 1948   "it was ok"  . CARDIAC CATHETERIZATION    . CARDIOVASCULAR STRESS TEST  03/08/2009   EF 84%  . CATARACT EXTRACTION W/ INTRAOCULAR LENS  IMPLANT, BILATERAL  Bilateral   . INGUINAL HERNIA REPAIR Right 06/23/2017   Procedure: REPAIR INCARCERATED  RIGHT FEMORAL HERNIA WITH MESH;  Surgeon: Harriette Bouillon, MD;  Location: [facility];  Service: General;  Laterality: Right;  . KNEE ARTHROSCOPY Right   . RIGHT/LEFT HEART CATH AND CORONARY ANGIOGRAPHY N/A 07/25/2016   Procedure: Right/Left Heart Cath and Coronary Angiography;  Surgeon: Kathleene Hazel, MD;  Location: [facility] INVASIVE CV LAB;  Service: Cardiovascular;  Laterality: N/A;  . SVT ABLATION  09/19/2016  . SVT ABLATION N/A 09/19/2016   Procedure: SVT Ablation;  Surgeon: Hillis Range, MD;  Location: [facility] CV LAB;  Service: Cardiovascular;  Laterality: N/A;  . TONSILLECTOMY AND ADENOIDECTOMY    . US ECHOCARDIOGRAPHY  01/17/2003   EF 55-60%    ROS- all systems are reviewed and negatives except as per HPI above  Current Outpatient Medications  Medication Sig Dispense Refill  . acetaminophen (TYLENOL) 500 MG tablet Take 1,000 mg by mouth 2 (two) times daily as needed for moderate pain or headache.    . ALPRAZolam (XANAX) 0.25 MG tablet TAKE ONE TABLET BY MOUTH AT BEDTIME AS NEEDED FOR ANXIETY 90 tablet 1  . amLODipine (NORVASC) 5 MG tablet TAKE ONE TABLET BY MOUTH DAILY 90 tablet 1  . Ascorbic Acid (VITAMIN C PO) Take 1 tablet by mouth daily. Reported on 08/08/2015    . aspirin 81 MG tablet Take 81 mg by mouth at bedtime.     Marland Kitchen  B Complex Vitamins (VITAMIN B COMPLEX PO) Take 1 tablet by mouth daily.     . calcium carbonate (OS-CAL) 600 MG tablet Take 600 mg by mouth daily.    . diclofenac sodium (VOLTAREN) 1 % GEL Apply 2 g topically 4 (four) times daily. 3 Tube 2  . diltiazem (CARDIZEM CD) 120 MG 24 hr capsule Take 120 mg by mouth daily.    Marland Kitchen diltiazem (CARDIZEM) 30 MG tablet Take 1 tablet (30 mg total) by mouth 4 (four) times daily as needed. 120 tablet 11  . docusate sodium (COLACE) 100 MG capsule Take 100 mg by mouth daily.     . furosemide (LASIX) 40 MG tablet TAKE ONE TABLET BY MOUTH DAILY 90  tablet 3  . gabapentin (NEURONTIN) 100 MG capsule Take 1 capsule (100 mg total) by mouth 3 (three) times daily. 90 capsule 2  . Glucosamine HCl-MSM (GLUCOSAMINE-MSM PO) Take 2 tablets by mouth daily.     . methocarbamol (ROBAXIN) 500 MG tablet Take 1 tablet (500 mg total) by mouth every 6 (six) hours as needed for muscle spasms. 30 tablet 0  . nitroGLYCERIN (NITROSTAT) 0.4 MG SL tablet Place 1 tablet (0.4 mg total) under the tongue every 5 (five) minutes as needed for chest pain. 25 tablet 3  . omeprazole (PRILOSEC) 40 MG capsule TAKE ONE CAPSULE BY MOUTH DAILY 30 capsule 3  . PRESCRIPTION MEDICATION Apply 1 Dose to eye every 3 (three) months. Eylea - eye injection    . simvastatin (ZOCOR) 10 MG tablet Take 1 tablet (10 mg total) by mouth daily at 6 PM. Yearly appointment due in December for more refills, thanks! 90 tablet 1  . SYMBICORT 80-4.5 MCG/ACT inhaler INHALE 1 PUFF INTO THE LUNGS TWICE A DAY AS DIRECTED 1 Inhaler 5  . tobramycin (TOBREX) 0.3 % ophthalmic solution Place 1 drop into the right eye 4 (four) times daily. Use the day before, the day of, and the day after eye injections    . traMADol (ULTRAM) 50 MG tablet Take 1 tablet (50 mg total) by mouth 2 (two) times daily as needed. 60 tablet 2  . triamcinolone cream (KENALOG) 0.1 % Apply 1 application topically daily as needed (hives). Apply to affected area 30 g 0  . vitamin E 200 UNIT capsule Take 200 Units by mouth daily.     No current facility-administered medications for this visit.     Physical Exam: Vitals:   02/16/18 1551  BP: 126/64  Pulse: 71  SpO2: 97%  Weight: 137 lb 6.4 oz (62.3 kg)  Height: 5\' 3"  (1.6 m)    GEN- The patient is well appearing, alert and oriented x 3 today.   Head- normocephalic, atraumatic Eyes-  Sclera clear, conjunctiva pink Ears- hearing intact Oropharynx- clear Lungs- Clear to ausculation bilaterally, normal work of breathing Heart- Regular rate and rhythm, no murmurs, rubs or gallops, PMI  not laterally displaced GI- soft, NT, ND, + BS Extremities- no clubbing, cyanosis, or edema  Wt Readings from Last 3 Encounters:  02/16/18 137 lb 6.4 oz (62.3 kg)  02/16/18 137 lb (62.1 kg)  12/29/17 135 lb (61.2 kg)    EKG tracing ordered today is personally reviewed and shows sinus rhythm 71 bpm, PR 250 msec, QRS 96 msec, LAD, LVH, nonspecific St/T changes  Assessment and Plan:  1. SVT Well controlled  2. HTN Stable No change required today  Return in 9 months   Hillis Range MD, [facility] 02/16/2018 4:10 PM

## 2018-02-16 NOTE — Patient Instructions (Signed)
Cont Voltaren gel, or Salon Pas  Cont gabapentin

## 2018-02-16 NOTE — Patient Instructions (Addendum)
Medication Instructions:  Your physician recommends that you continue on your current medications as directed. Please refer to the Current Medication list given to you today.   Labwork: None ordered   Testing/Procedures: None ordered    Follow-Up: Your physician wants you to follow-up in: 9 months with Dr Allred. You will receive a reminder letter in the mail two months in advance. If you don't receive a letter, please call our office to schedule the follow-up appointment.   Any Other Special Instructions Will Be Listed Below (If Applicable).     If you need a refill on your cardiac medications before your next appointment, please call your pharmacy.   

## 2018-02-23 ENCOUNTER — Other Ambulatory Visit: Payer: Self-pay | Admitting: Internal Medicine

## 2018-02-23 MED ORDER — DILTIAZEM HCL ER COATED BEADS 120 MG PO CP24
120.0000 mg | ORAL_CAPSULE | Freq: Every day | ORAL | 0 refills | Status: DC
Start: 2018-02-23 — End: 2018-05-26

## 2018-03-03 ENCOUNTER — Other Ambulatory Visit: Payer: Self-pay | Admitting: Internal Medicine

## 2018-03-03 NOTE — Telephone Encounter (Signed)
done erx 

## 2018-03-03 NOTE — Telephone Encounter (Signed)
[place] Controlled Database Checked Last filled: 12/15/17 # 60 LOV w/you: 12/15/17 Next appt w/you: 06/17/18

## 2018-03-09 ENCOUNTER — Other Ambulatory Visit: Payer: Self-pay | Admitting: Internal Medicine

## 2018-03-11 DIAGNOSIS — F4329 Adjustment disorder with other symptoms: Secondary | ICD-10-CM | POA: Diagnosis not present

## 2018-03-16 ENCOUNTER — Other Ambulatory Visit: Payer: Self-pay | Admitting: Cardiology

## 2018-03-19 ENCOUNTER — Telehealth: Payer: Self-pay | Admitting: Pulmonary Disease

## 2018-03-19 MED ORDER — BUDESONIDE-FORMOTEROL FUMARATE 80-4.5 MCG/ACT IN AERO: INHALATION_SPRAY | RESPIRATORY_TRACT | 5 refills | Status: DC

## 2018-03-19 NOTE — Telephone Encounter (Signed)
Called and spoke with patient, she is requesting a refill of symbicort. Refill sent. Nothing further needed.

## 2018-03-20 DIAGNOSIS — H353212 Exudative age-related macular degeneration, right eye, with inactive choroidal neovascularization: Secondary | ICD-10-CM | POA: Diagnosis not present

## 2018-03-20 DIAGNOSIS — H353223 Exudative age-related macular degeneration, left eye, with inactive scar: Secondary | ICD-10-CM | POA: Diagnosis not present

## 2018-03-20 DIAGNOSIS — H43813 Vitreous degeneration, bilateral: Secondary | ICD-10-CM | POA: Diagnosis not present

## 2018-03-20 DIAGNOSIS — H35371 Puckering of macula, right eye: Secondary | ICD-10-CM | POA: Diagnosis not present

## 2018-04-06 DIAGNOSIS — Z23 Encounter for immunization: Secondary | ICD-10-CM | POA: Diagnosis not present

## 2018-04-08 DIAGNOSIS — F4329 Adjustment disorder with other symptoms: Secondary | ICD-10-CM | POA: Diagnosis not present

## 2018-04-15 DIAGNOSIS — N816 Rectocele: Secondary | ICD-10-CM | POA: Diagnosis not present

## 2018-04-27 ENCOUNTER — Ambulatory Visit (INDEPENDENT_AMBULATORY_CARE_PROVIDER_SITE_OTHER): Payer: Medicare Other | Admitting: Cardiology

## 2018-04-27 ENCOUNTER — Encounter: Payer: Self-pay | Admitting: Cardiology

## 2018-04-27 VITALS — BP 130/62 | HR 75 | Ht 63.0 in | Wt 138.4 lb

## 2018-04-27 DIAGNOSIS — I2510 Atherosclerotic heart disease of native coronary artery without angina pectoris: Secondary | ICD-10-CM

## 2018-04-27 DIAGNOSIS — I471 Supraventricular tachycardia, unspecified: Secondary | ICD-10-CM

## 2018-04-27 DIAGNOSIS — E7800 Pure hypercholesterolemia, unspecified: Secondary | ICD-10-CM | POA: Diagnosis not present

## 2018-04-27 DIAGNOSIS — I10 Essential (primary) hypertension: Secondary | ICD-10-CM | POA: Diagnosis not present

## 2018-04-27 NOTE — Progress Notes (Signed)
Cardiology Office Note:    Date:  04/27/2018   ID:  Alexandra CarawayFlorence C Reyes, DOB 02/17/1926, MRN [id number]  PCP:  [name], [name], MD  Cardiologist:  No primary care provider on file.  Electrophysiologist:  None   Referring MD: [name], [name], MD     History of Present Illness:    Alexandra Reyes is a 84 y.o. female here for follow-up of SVT.  Overall is been doing quite well.  Saw Dr. [name] last on 02/16/2018.  No chest pain fevers chills nausea vomiting shortness of breath.  Right hernia repair. 06/2017. Right nerve chest pain. Neurontin. Alexandra RobinsonsSarah Reyes. Used to see Dr. [name]. Intercostal neuralgia. Sleepy.  Overall been doing quite well.  [place] to [place] from [place] to help treat the polio epidemic.  [place] [employer] in [place]  Past Medical History:  Diagnosis Date  . Age-related macular degeneration, wet, both eyes (HCC)   . Alcohol dependence in remission (HCC) 05/22/2011  . Allergic rhinitis, cause unspecified 05/22/2011  . Allergy   . Anemia, iron deficiency 05/18/2011  . Cholelithiasis   . COPD (chronic obstructive pulmonary disease) (HCC)   . DDD (degenerative disc disease), lumbar 05/18/2011  . Depression    "@ times" (09/19/2016)  . First degree AV block   . GERD (gastroesophageal reflux disease)   . Hiatal hernia   . History of blood transfusion    "3 or 4 related to childbirth"  . HTN (hypertension) 05/18/2011   pt denies this hx on 09/19/2016  . Hyperlipidemia 05/18/2011  . Macular degeneration   . Myocardial infarction (HCC)    "EKG shows I've had 2; date unknown" (09/19/2016)  . OA (osteoarthritis)    "a little here and there; mainly in the back" (09/19/2016)  . Osteoporosis 05/18/2011  . PAT (paroxysmal atrial tachycardia) (HCC)   . Pectus excavatum 05/18/2011  . Personal history of colonic polyps 10/28/2012  . Pneumonia    "as a child"  . SVT (supraventricular tachycardia) (HCC)   . Urinary frequency     Past Surgical History:    Procedure Laterality Date  . ABDOMINAL HYSTERECTOMY  1988  . BREAST BIOPSY Right 1948   "it was ok"  . CARDIAC CATHETERIZATION    . CARDIOVASCULAR STRESS TEST  03/08/2009   EF 84%  . CATARACT EXTRACTION W/ INTRAOCULAR LENS  IMPLANT, BILATERAL Bilateral   . INGUINAL HERNIA REPAIR Right 06/23/2017   Procedure: REPAIR INCARCERATED  RIGHT FEMORAL HERNIA WITH MESH;  Surgeon: Harriette Bouillonornett, Thomas, MD;  Location: [facility] OR;  Service: General;  Laterality: Right;  . KNEE ARTHROSCOPY Right   . RIGHT/LEFT HEART CATH AND CORONARY ANGIOGRAPHY N/A 07/25/2016   Procedure: Right/Left Heart Cath and Coronary Angiography;  Surgeon: Kathleene Hazelhristopher D McAlhany, MD;  [facility];  Service: Cardiovascular;  Laterality: N/A;  . SVT ABLATION  09/19/2016  . SVT ABLATION N/A 09/19/2016   Procedure: SVT Ablation;  Surgeon: Hillis RangeJames Allred, MD;  [facility];  Service: Cardiovascular;  Laterality: N/A;  . TONSILLECTOMY AND ADENOIDECTOMY    . US ECHOCARDIOGRAPHY  01/17/2003   EF 55-60%    Current Medications: Current Meds  Medication Sig  . acetaminophen (TYLENOL) 500 MG tablet Take 1,000 mg by mouth 2 (two) times daily as needed for moderate pain or headache.  . ALPRAZolam (XANAX) 0.25 MG tablet TAKE 1 TABLET BY MOUTH AT BEDTIME AS NEEDED FOR ANXIETY  . amLODipine (NORVASC) 5 MG tablet TAKE ONE TABLET  BY MOUTH DAILY  . Ascorbic Acid (VITAMIN C PO) Take 1 tablet by mouth daily. Reported on 08/08/2015  . aspirin 81 MG tablet Take 81 mg by mouth at bedtime.   . B Complex Vitamins (VITAMIN B COMPLEX PO) Take 1 tablet by mouth daily.   . budesonide-formoterol (SYMBICORT) 80-4.5 MCG/ACT inhaler INHALE 1 PUFF INTO THE LUNGS TWICE A DAY AS DIRECTED  . calcium carbonate (OS-CAL) 600 MG tablet Take 600 mg by mouth daily.  Marland Kitchen diltiazem (CARDIZEM CD) 120 MG 24 hr capsule Take 1 capsule (120 mg total) by mouth daily. Please keep upcoming appt in November before anymore refills. Thanks  . diltiazem (CARDIZEM) 30  MG tablet Take 1 tablet (30 mg total) by mouth 4 (four) times daily as needed.  . docusate sodium (COLACE) 100 MG capsule Take 100 mg by mouth daily.   . furosemide (LASIX) 40 MG tablet Take 1 tablet (40 mg total) by mouth daily. Please keep upcoming appt in November for future refills. Thank you  . gabapentin (NEURONTIN) 100 MG capsule Take 1 capsule (100 mg total) by mouth 3 (three) times daily.  . Glucosamine HCl-MSM (GLUCOSAMINE-MSM PO) Take 2 tablets by mouth daily.   . methocarbamol (ROBAXIN) 500 MG tablet Take 1 tablet (500 mg total) by mouth every 6 (six) hours as needed for muscle spasms.  . nitroGLYCERIN (NITROSTAT) 0.4 MG SL tablet Place 1 tablet (0.4 mg total) under the tongue every 5 (five) minutes as needed for chest pain.  Marland Kitchen omeprazole (PRILOSEC) 40 MG capsule TAKE ONE CAPSULE BY MOUTH DAILY  . PRESCRIPTION MEDICATION Apply 1 Dose to eye every 3 (three) months. Eylea - eye injection  . simvastatin (ZOCOR) 10 MG tablet Take 1 tablet (10 mg total) by mouth daily at 6 PM. Yearly appointment due in December for more refills, thanks!  Marland Kitchen tobramycin (TOBREX) 0.3 % ophthalmic solution Place 1 drop into the right eye 4 (four) times daily. Use the day before, the day of, and the day after eye injections  . traMADol (ULTRAM) 50 MG tablet Take 1 tablet (50 mg total) by mouth 2 (two) times daily as needed.  . triamcinolone cream (KENALOG) 0.1 % Apply 1 application topically daily as needed (hives). Apply to affected area  . vitamin E 200 UNIT capsule Take 200 Units by mouth daily.     Allergies:   Symbicort [budesonide-formoterol fumarate]   Social History   Socioeconomic History  . Marital status: Widowed    Spouse name: Not on file  . Number of children: 2  . Years of education: 43  . Highest education level: Not on file  Occupational History  . Occupation: Retired Designer, jewellery  Social Needs  . Financial resource strain: Not hard at all  . Food insecurity:    Worry: Never true      Inability: Never true  . Transportation needs:    Medical: No    Non-medical: No  Tobacco Use  . Smoking status: Former Smoker    Packs/day: 1.50    Years: 36.00    Pack years: 54.00    Types: Cigarettes    Start date: 03/01/1959    Last attempt to quit: 06/10/1994    Years since quitting: 23.8  . Smokeless tobacco: Never Used  Substance and Sexual Activity  . Alcohol use: No    Frequency: Never  . Drug use: No  . Sexual activity: Never  Lifestyle  . Physical activity:    Days per week: 0 days  Minutes per session: 0 min  . Stress: Not at all  Relationships  . Social connections:    Talks on phone: More than three times a week    Gets together: More than three times a week    Attends religious service: More than 4 times per year    Active member of club or organization: Yes    Attends meetings of clubs or organizations: More than 4 times per year    Relationship status: Widowed  Other Topics Concern  . Not on file  Social History Narrative   Originally from [place], [place]. She moved to [place] in 1950 during the polio epidemic. 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 Remote bird exposure. No mold or hot tub exposure.      Family History: The patient's family history includes Arthritis in her mother; Heart disease in her father; Heart failure in her mother; Hypertension in her father; Kidney failure in her father.  ROS:   Please see the history of present illness.     All other systems reviewed and are negative.  EKGs/Labs/Other Studies Reviewed:    The following studies were reviewed today: Prior office notes reviewed lab work reviewed  EKG: EKG shows sinus rhythm 71 left axis deviation left ventricular hypertrophy with nonspecific ST-T wave changes personally reviewed and interpreted from 02/16/2018.  Recent Labs: 12/15/2017: ALT 15; BUN 12; Creatinine, Ser 0.69; Hemoglobin 14.7; Platelets 407.0; Potassium 4.6; Sodium 133  Recent Lipid Panel    Component  Value Date/Time   CHOL 160 09/30/2016 1205   TRIG 90 09/30/2016 1205   HDL 73 09/30/2016 1205   CHOLHDL 2.2 09/30/2016 1205   CHOLHDL 2.0 05/24/2015 0831   VLDL 14 05/24/2015 0831   LDLCALC 69 09/30/2016 1205   LDLDIRECT 129.2 05/15/2011 0925    Physical Exam:    VS:  BP 130/62   Pulse 75   Ht 5\' 3"  (1.6 m)   Wt 138 lb 6.4 oz (62.8 kg)   LMP  (LMP Unknown)   SpO2 94%   BMI 24.52 kg/m     Wt Readings from Last 3 Encounters:  04/27/18 138 lb 6.4 oz (62.8 kg)  02/16/18 137 lb 6.4 oz (62.3 kg)  02/16/18 137 lb (62.1 kg)     GEN:  Well nourished, well developed in no acute distress HEENT: Normal NECK: No JVD; No carotid bruits LYMPHATICS: No lymphadenopathy CARDIAC: RRR, no murmurs, rubs, gallops RESPIRATORY:  Clear to auscultation without rales, wheezing or rhonchi  ABDOMEN: Soft, non-tender, non-distended MUSCULOSKELETAL:  No edema; No deformity  SKIN: Warm and dry NEUROLOGIC:  Alert and oriented x 3 PSYCHIATRIC:  Normal affect   ASSESSMENT:    1. SVT (supraventricular tachycardia) (HCC)   2. Coronary artery disease involving native coronary artery of native heart without angina pectoris   3. Pure hypercholesterolemia   4. Essential hypertension    PLAN:    In order of problems listed above:  PSVT - Currently well controlled with Cardizem CD.  She has low-dose Cardizem to take as needed as well.  Prior SVT ablation, AVNRT.  Coronary artery disease -Medical management.  Cardiac catheterization 07/25/2016 showed chronically occluded RCA.  Doing well.  Chronic venous insufficiency/edema -Support hose, leg elevation.  Essential hypertension -Stable, medicines reviewed.  Hyperlipidemia -Low-dose simvastatin.   Medication Adjustments/Labs and Tests Ordered: Current medicines are reviewed at length with the patient today.  Concerns regarding medicines are outlined above.  No orders of the defined types were placed in  this encounter.  No orders of the  defined types were placed in this encounter.   Patient Instructions  Medication Instructions:  The current medical regimen is effective;  continue present plan and medications.  If you need a refill on your cardiac medications before your next appointment, please call your pharmacy.   Follow-Up: At [facility], you and your health needs are our priority.  As part of our continuing mission to provide you with exceptional heart care, we have created designated Provider Care Teams.  These Care Teams include your primary Cardiologist (physician) and Advanced Practice Providers (APPs -  Physician Assistants and Nurse Practitioners) who all work together to provide you with the care you need, when you need it. You will need a follow up appointment in 12 months.  Please call our office 2 months in advance to schedule this appointment.  You may see Dr Anne Fu. or one of the following Advanced Practice Providers on your designated Care Team:   Norma Fredrickson, NP Nada Boozer, NP . Georgie Chard, NP  Thank you for choosing [facility]!!         Signed, Donato Schultz, MD  04/27/2018 12:51 PM    [facility]

## 2018-04-27 NOTE — Patient Instructions (Signed)
Medication Instructions:  The current medical regimen is effective;  continue present plan and medications.  If you need a refill on your cardiac medications before your next appointment, please call your pharmacy.   Follow-Up: At [facility], you and your health needs are our priority.  As part of our continuing mission to provide you with exceptional heart care, we have created designated Provider Care Teams.  These Care Teams include your primary Cardiologist (physician) and Advanced Practice Providers (APPs -  Physician Assistants and Nurse Practitioners) who all work together to provide you with the care you need, when you need it. You will need a follow up appointment in 12 months.  Please call our office 2 months in advance to schedule this appointment.  You may see Dr Skains. or one of the following Advanced Practice Providers on your designated Care Team:   Lori Gerhardt, NP Laura Ingold, NP . Jill McDaniel, NP  Thank you for choosing [facility]!!      

## 2018-04-28 ENCOUNTER — Other Ambulatory Visit: Payer: Self-pay | Admitting: Internal Medicine

## 2018-05-06 DIAGNOSIS — F4329 Adjustment disorder with other symptoms: Secondary | ICD-10-CM | POA: Diagnosis not present

## 2018-05-25 ENCOUNTER — Other Ambulatory Visit: Payer: Self-pay | Admitting: Internal Medicine

## 2018-06-08 ENCOUNTER — Other Ambulatory Visit: Payer: Self-pay | Admitting: Physical Medicine & Rehabilitation

## 2018-06-08 ENCOUNTER — Other Ambulatory Visit: Payer: Self-pay | Admitting: Internal Medicine

## 2018-06-17 ENCOUNTER — Encounter: Payer: Self-pay | Admitting: Internal Medicine

## 2018-06-17 ENCOUNTER — Other Ambulatory Visit (INDEPENDENT_AMBULATORY_CARE_PROVIDER_SITE_OTHER): Payer: Medicare Other

## 2018-06-17 ENCOUNTER — Ambulatory Visit (INDEPENDENT_AMBULATORY_CARE_PROVIDER_SITE_OTHER): Payer: Medicare Other | Admitting: Internal Medicine

## 2018-06-17 ENCOUNTER — Other Ambulatory Visit: Payer: Self-pay | Admitting: Internal Medicine

## 2018-06-17 ENCOUNTER — Telehealth: Payer: Self-pay

## 2018-06-17 VITALS — BP 124/68 | HR 77 | Temp 97.9°F | Ht 63.0 in | Wt 137.0 lb

## 2018-06-17 DIAGNOSIS — R739 Hyperglycemia, unspecified: Secondary | ICD-10-CM

## 2018-06-17 DIAGNOSIS — R3989 Other symptoms and signs involving the genitourinary system: Secondary | ICD-10-CM

## 2018-06-17 DIAGNOSIS — N183 Chronic kidney disease, stage 3 unspecified: Secondary | ICD-10-CM

## 2018-06-17 DIAGNOSIS — E785 Hyperlipidemia, unspecified: Secondary | ICD-10-CM

## 2018-06-17 DIAGNOSIS — D509 Iron deficiency anemia, unspecified: Secondary | ICD-10-CM

## 2018-06-17 DIAGNOSIS — M25531 Pain in right wrist: Secondary | ICD-10-CM | POA: Diagnosis not present

## 2018-06-17 LAB — BASIC METABOLIC PANEL
BUN: 14 mg/dL (ref 6–23)
CALCIUM: 9.2 mg/dL (ref 8.4–10.5)
CO2: 27 mEq/L (ref 19–32)
Chloride: 99 mEq/L (ref 96–112)
Creatinine, Ser: 0.74 mg/dL (ref 0.40–1.20)
GFR: 78.31 mL/min (ref >60.00)
Glucose, Bld: 86 mg/dL (ref 70–99)
Potassium: 4.2 mEq/L (ref 3.5–5.1)
Sodium: 134 mEq/L — ABNORMAL LOW (ref 135–145)

## 2018-06-17 LAB — URINALYSIS, ROUTINE W REFLEX MICROSCOPIC
Bilirubin Urine: NEGATIVE
Hgb urine dipstick: NEGATIVE
Ketones, ur: NEGATIVE
Nitrite: NEGATIVE
Specific Gravity, Urine: <=1.005 — AB (ref 1.000–1.030)
Total Protein, Urine: NEGATIVE
Urine Glucose: NEGATIVE
Urobilinogen, UA: 0.2 (ref 0.0–1.0)
pH: 6.5 (ref 5.0–8.0)

## 2018-06-17 LAB — LIPID PANEL
Cholesterol: 174 mg/dL (ref 0–200)
HDL: 73.3 mg/dL (ref >39.00)
LDL Cholesterol: 80 mg/dL (ref 0–99)
NonHDL: 100.55
Total CHOL/HDL Ratio: 2
Triglycerides: 105 mg/dL (ref 0.0–149.0)
VLDL: 21 mg/dL (ref 0.0–40.0)

## 2018-06-17 LAB — HEPATIC FUNCTION PANEL
ALT: 15 U/L (ref 0–35)
AST: 18 U/L (ref 0–37)
Albumin: 4.1 g/dL (ref 3.5–5.2)
Alkaline Phosphatase: 79 U/L (ref 39–117)
Bilirubin, Direct: 0.1 mg/dL (ref 0.0–0.3)
TOTAL PROTEIN: 6.6 g/dL (ref 6.0–8.3)
Total Bilirubin: 0.5 mg/dL (ref 0.2–1.2)

## 2018-06-17 LAB — CBC WITH DIFFERENTIAL/PLATELET
Basophils Absolute: 0.1 10*3/uL (ref 0.0–0.1)
Basophils Relative: 1 % (ref 0.0–3.0)
EOS PCT: 1.7 % (ref 0.0–5.0)
Eosinophils Absolute: 0.1 10*3/uL (ref 0.0–0.7)
HEMATOCRIT: 45.1 % (ref 36.0–46.0)
Hemoglobin: 15.1 g/dL — ABNORMAL HIGH (ref 12.0–15.0)
Lymphocytes Relative: 10.4 % — ABNORMAL LOW (ref 12.0–46.0)
Lymphs Abs: 0.9 10*3/uL (ref 0.7–4.0)
MCHC: 33.4 g/dL (ref 30.0–36.0)
MCV: 89.7 fl (ref 78.0–100.0)
Monocytes Absolute: 0.9 10*3/uL (ref 0.1–1.0)
Monocytes Relative: 10.3 % (ref 3.0–12.0)
Neutro Abs: 6.6 10*3/uL (ref 1.4–7.7)
Neutrophils Relative %: 76.6 % (ref 43.0–77.0)
Platelets: 423 10*3/uL — ABNORMAL HIGH (ref 150.0–400.0)
RBC: 5.03 Mil/uL (ref 3.87–5.11)
RDW: 14.3 % (ref 11.5–15.5)
WBC: 8.7 10*3/uL (ref 4.0–10.5)

## 2018-06-17 LAB — HEMOGLOBIN A1C: Hgb A1c MFr Bld: 5.9 % (ref 4.6–6.5)

## 2018-06-17 LAB — TSH: TSH: 1.39 u[IU]/mL (ref 0.35–4.50)

## 2018-06-17 MED ORDER — TRAMADOL HCL 50 MG PO TABS: 50.0000 mg | ORAL_TABLET | Freq: Two times a day (BID) | ORAL | 2 refills | Status: DC | PRN

## 2018-06-17 MED ORDER — CEPHALEXIN 500 MG PO CAPS: 500.0000 mg | ORAL_CAPSULE | Freq: Three times a day (TID) | ORAL | 0 refills | Status: AC

## 2018-06-17 NOTE — Progress Notes (Signed)
Subjective:    Patient ID: Alexandra Reyes, female    DOB: 02/07/1926, 85 y.o.   MRN: [id number]  HPI  Here to f/u; overall doing ok,  Pt denies chest pain, increasing sob or doe, wheezing, orthopnea, PND, increased LE swelling, palpitations, dizziness or syncope.  Pt denies new neurological symptoms such as new headache, or facial or extremity weakness or numbness.  Pt denies polydipsia, polyuria, or low sugar episode.  Pt states overall good compliance with meds, mostly trying to follow appropriate diet, with wt overall stable,  but little exercise however. Denies urinary symptoms such as dysuria, frequency, urgency, flank pain, hematuria or n/v, fever, chills, except the urine is brownish color to her, and asks for UA.  No recent overt bleeding or known iron low.  Has been off iron supplement for 6 mo.  Pt continues to have recurring LBP without change in severity, bowel or bladder change, fever, wt loss,  worsening LE pain/numbness/weakness, gait change or falls.  Also has ongoing worsening persistent intermittent mild to mod right wrist arthritic pain.   BP Readings from Last 3 Encounters:  06/17/18 124/68  04/27/18 130/62  02/16/18 126/64   Wt Readings from Last 3 Encounters:  06/17/18 137 lb (62.1 kg)  04/27/18 138 lb 6.4 oz (62.8 kg)  02/16/18 137 lb 6.4 oz (62.3 kg)   Past Medical History:  Diagnosis Date  . Age-related macular degeneration, wet, both eyes (HCC)   . Alcohol dependence in remission (HCC) 05/22/2011  . Allergic rhinitis, cause unspecified 05/22/2011  . Allergy   . Anemia, iron deficiency 05/18/2011  . Cholelithiasis   . COPD (chronic obstructive pulmonary disease) (HCC)   . DDD (degenerative disc disease), lumbar 05/18/2011  . Depression    "@ times" (09/19/2016)  . First degree AV block   . GERD (gastroesophageal reflux disease)   . Hiatal hernia   . History of blood transfusion    "3 or 4 related to childbirth"  . HTN (hypertension) 05/18/2011   pt denies  this hx on 09/19/2016  . Hyperlipidemia 05/18/2011  . Macular degeneration   . Myocardial infarction (HCC)    "EKG shows I've had 2; date unknown" (09/19/2016)  . OA (osteoarthritis)    "a little here and there; mainly in the back" (09/19/2016)  . Osteoporosis 05/18/2011  . PAT (paroxysmal atrial tachycardia) (HCC)   . Pectus excavatum 05/18/2011  . Personal history of colonic polyps 10/28/2012  . Pneumonia    "as a child"  . SVT (supraventricular tachycardia) (HCC)   . Urinary frequency    Past Surgical History:  Procedure Laterality Date  . ABDOMINAL HYSTERECTOMY  1988  . BREAST BIOPSY Right 1948   "it was ok"  . CARDIAC CATHETERIZATION    . CARDIOVASCULAR STRESS TEST  03/08/2009   EF 84%  . CATARACT EXTRACTION W/ INTRAOCULAR LENS  IMPLANT, BILATERAL Bilateral   . INGUINAL HERNIA REPAIR Right 06/23/2017   Procedure: REPAIR INCARCERATED  RIGHT FEMORAL HERNIA WITH MESH;  Surgeon: Harriette Bouillon, MD;  Location: [facility] OR;  Service: General;  Laterality: Right;  . KNEE ARTHROSCOPY Right   . RIGHT/LEFT HEART CATH AND CORONARY ANGIOGRAPHY N/A 07/25/2016   Procedure: Right/Left Heart Cath and Coronary Angiography;  Surgeon: Kathleene Hazel, MD;  Location: [facility] INVASIVE CV LAB;  Service: Cardiovascular;  Laterality: N/A;  . SVT ABLATION  09/19/2016  . SVT ABLATION N/A 09/19/2016   Procedure: SVT Ablation;  Surgeon: Hillis Range, MD;  Location: [facility] INVASIVE CV LAB;  Service: Cardiovascular;  Laterality: N/A;  . TONSILLECTOMY AND ADENOIDECTOMY    . US ECHOCARDIOGRAPHY  01/17/2003   EF 55-60%    reports that she quit smoking about 24 years ago. Her smoking use included cigarettes. She started smoking about 59 years ago. She has a 54.00 pack-year smoking history. She has never used smokeless tobacco. She reports that she does not drink alcohol or use drugs. family history includes Arthritis in her mother; Heart disease in her father; Heart failure in her mother; Hypertension in her father; Kidney  failure in her father. Allergies  Allergen Reactions  . Symbicort [Budesonide-Formoterol Fumarate]     Jittery in high doses, this has been reduced and is tolerated at this time.    Current Outpatient Medications on File Prior to Visit  Medication Sig Dispense Refill  . acetaminophen (TYLENOL) 500 MG tablet Take 1,000 mg by mouth 2 (two) times daily as needed for moderate pain or headache.    . ALPRAZolam (XANAX) 0.25 MG tablet TAKE 1 TABLET BY MOUTH AT BEDTIME AS NEEDED FOR ANXIETY 90 tablet 1  . amLODipine (NORVASC) 5 MG tablet TAKE ONE TABLET BY MOUTH DAILY 90 tablet 3  . Ascorbic Acid (VITAMIN C PO) Take 1 tablet by mouth daily. Reported on 08/08/2015    . aspirin 81 MG tablet Take 81 mg by mouth at bedtime.     . B Complex Vitamins (VITAMIN B COMPLEX PO) Take 1 tablet by mouth daily.     . budesonide-formoterol (SYMBICORT) 80-4.5 MCG/ACT inhaler INHALE 1 PUFF INTO THE LUNGS TWICE A DAY AS DIRECTED 1 Inhaler 5  . calcium carbonate (OS-CAL) 600 MG tablet Take 600 mg by mouth daily.    Marland Kitchen diltiazem (CARDIZEM) 30 MG tablet Take 1 tablet (30 mg total) by mouth 4 (four) times daily as needed. 120 tablet 11  . diltiazem (CARTIA XT) 120 MG 24 hr capsule Take 1 capsule (120 mg total) by mouth daily. 90 capsule 2  . docusate sodium (COLACE) 100 MG capsule Take 100 mg by mouth daily.     . furosemide (LASIX) 40 MG tablet Take 1 tablet (40 mg total) by mouth daily. Please keep upcoming appt in November for future refills. Thank you 90 tablet 0  . gabapentin (NEURONTIN) 100 MG capsule TAKE ONE CAPSULE BY MOUTH THREE TIMES A DAY 90 capsule 2  . Glucosamine HCl-MSM (GLUCOSAMINE-MSM PO) Take 2 tablets by mouth daily.     . methocarbamol (ROBAXIN) 500 MG tablet Take 1 tablet (500 mg total) by mouth every 6 (six) hours as needed for muscle spasms. 30 tablet 0  . nitroGLYCERIN (NITROSTAT) 0.4 MG SL tablet Place 1 tablet (0.4 mg total) under the tongue every 5 (five) minutes as needed for chest pain. 25 tablet  3  . omeprazole (PRILOSEC) 40 MG capsule TAKE ONE CAPSULE BY MOUTH DAILY 90 capsule 1  . PRESCRIPTION MEDICATION Apply 1 Dose to eye every 3 (three) months. Eylea - eye injection    . simvastatin (ZOCOR) 10 MG tablet Take 1 tablet (10 mg total) by mouth daily at 6 PM. Yearly appointment due in December for more refills, thanks! 90 tablet 1  . tobramycin (TOBREX) 0.3 % ophthalmic solution Place 1 drop into the right eye 4 (four) times daily. Use the day before, the day of, and the day after eye injections    . triamcinolone cream (KENALOG) 0.1 % Apply 1 application topically daily as needed (hives). Apply to affected area 30 g 0  . vitamin  E 200 UNIT capsule Take 200 Units by mouth daily.     No current facility-administered medications on file prior to visit.    Review of Systems  Constitutional: Negative for other unusual diaphoresis or sweats HENT: Negative for ear discharge or swelling Eyes: Negative for other worsening visual disturbances Respiratory: Negative for stridor or other swelling  Gastrointestinal: Negative for worsening distension or other blood Genitourinary: Negative for retention or other urinary change Musculoskeletal: Negative for other MSK pain or swelling Skin: Negative for color change or other new lesions Neurological: Negative for worsening tremors and other numbness  Psychiatric/Behavioral: Negative for worsening agitation or other fatigue     Objective:   Physical Exam BP 124/68   Pulse 77   Temp 97.9 F (36.6 C) (Oral)   Ht 5\' 3"  (1.6 m)   Wt 137 lb (62.1 kg)   LMP  (LMP Unknown)   SpO2 94%   BMI 24.27 kg/m  Walks with cane VS noted,  Constitutional: Pt appears in NAD HENT: Head: NCAT.  Right Ear: External ear normal.  Left Ear: External ear normal.  Eyes: . Pupils are equal, round, and reactive to light. Conjunctivae and EOM are normal Nose: without d/c or deformity Neck: Neck supple. Gross normal ROM Cardiovascular: Normal rate and regular  rhythm.   Pulmonary/Chest: Effort normal and breath sounds without rales or wheezing.  Abd:  Soft, NT, ND, + BS, no organomegaly Right wrist with bony degenerative changes with mild swelling near styloid process Neurological: Pt is alert. At baseline orientation, motor grossly intact Skin: Skin is warm. No rashes, other new lesions, no LE edema Psychiatric: Pt behavior is normal without agitation  No other exam findings Lab Results  Component Value Date   WBC 8.7 06/17/2018   HGB 15.1 (H) 06/17/2018   HCT 45.1 06/17/2018   PLT 423.0 (H) 06/17/2018   GLUCOSE 86 06/17/2018   CHOL 174 06/17/2018   TRIG 105.0 06/17/2018   HDL 73.30 06/17/2018   LDLDIRECT 129.2 05/15/2011   LDLCALC 80 06/17/2018   ALT 15 06/17/2018   AST 18 06/17/2018   NA 134 (L) 06/17/2018   K 4.2 06/17/2018   CL 99 06/17/2018   CREATININE 0.74 06/17/2018   BUN 14 06/17/2018   CO2 27 06/17/2018   TSH 1.39 06/17/2018   INR 1.0 07/17/2016   HGBA1C 5.9 06/17/2018       Assessment & Plan:

## 2018-06-17 NOTE — Telephone Encounter (Signed)
Pt has been informed of results and expressed understanding.  °

## 2018-06-17 NOTE — Telephone Encounter (Signed)
-----   Message from Corwin LevinsJames W [name], MD sent at 06/17/2018 12:59 PM EST ----- Left message on MyChart, pt to cont same tx except  The test results show that your current treatment is OK, except the urinalysis could be consistent with infection.  The culture is pending.  We will send an antibiotic, and you should hear from the office as well.    Shirron to please inform pt, I will do rx

## 2018-06-17 NOTE — Patient Instructions (Signed)
Please continue all other medications as before, and refills have been done if requested. - the tramadol for pain  OK to restart your Voltaren gel for the right wrist pain  Please call if you change your mind about seeing the Hand Surgury  Please consider a right wrist splint to wear at night to see if this helps  Please have the pharmacy call with any other refills you may need.  Please continue your efforts at being more active, low cholesterol diet, and weight control.  Please keep your appointments with your specialists as you may have planned  Please go to the LAB in the Basement (turn left off the elevator) for the tests to be done today  You will be contacted by phone if any changes need to be made immediately.  Otherwise, you will receive a letter about your results with an explanation, but please check with MyChart first.  Please remember to sign up for MyChart if you have not done so, as this will be important to you in the future with finding out test results, communicating by private email, and scheduling acute appointments online when needed.  Please return in 6 months, or sooner if needed

## 2018-06-18 LAB — URINE CULTURE
MICRO NUMBER:: 27789
SPECIMEN QUALITY:: ADEQUATE

## 2018-06-20 DIAGNOSIS — M25539 Pain in unspecified wrist: Secondary | ICD-10-CM | POA: Insufficient documentation

## 2018-06-20 NOTE — Assessment & Plan Note (Signed)
stable overall by history and exam, recent data reviewed with pt, and pt to continue medical treatment as before,  to f/u any worsening symptoms or concerns, for f/u lab today 

## 2018-06-20 NOTE — Assessment & Plan Note (Signed)
?   Clinical significance, exam benign, for urine studies r/o infectious or hematuria

## 2018-06-20 NOTE — Assessment & Plan Note (Addendum)
Mild to mod, for volt gel prn, consider hand surgury,  to f/u any worsening symptoms or concerns  Note:  Total time for pt hx, exam, review of record with pt in the room, determination of diagnoses and plan for further eval and tx is > 40 min, with over 50% spent in coordination and counseling of patient including the differential dx, tx, further evaluation and other management of right wrist pain, hyperglycemia, CKD< HLD, abnormal urine color and iron deficiency anemia

## 2018-06-20 NOTE — Assessment & Plan Note (Signed)
stable overall by history and exam, recent data reviewed with pt, and pt to continue medical treatment as before,  to f/u any worsening symptoms or concerns  

## 2018-06-20 NOTE — Assessment & Plan Note (Signed)
Also for fu labs today, r/o recurrence off iron

## 2018-06-22 ENCOUNTER — Other Ambulatory Visit: Payer: Self-pay | Admitting: Cardiology

## 2018-06-22 DIAGNOSIS — H353212 Exudative age-related macular degeneration, right eye, with inactive choroidal neovascularization: Secondary | ICD-10-CM | POA: Diagnosis not present

## 2018-07-01 DIAGNOSIS — F4329 Adjustment disorder with other symptoms: Secondary | ICD-10-CM | POA: Diagnosis not present

## 2018-07-15 DIAGNOSIS — M1612 Unilateral primary osteoarthritis, left hip: Secondary | ICD-10-CM | POA: Diagnosis not present

## 2018-07-20 DIAGNOSIS — N816 Rectocele: Secondary | ICD-10-CM | POA: Diagnosis not present

## 2018-07-27 ENCOUNTER — Other Ambulatory Visit: Payer: Self-pay | Admitting: Cardiology

## 2018-07-29 DIAGNOSIS — F4329 Adjustment disorder with other symptoms: Secondary | ICD-10-CM | POA: Diagnosis not present

## 2018-08-10 ENCOUNTER — Ambulatory Visit (HOSPITAL_BASED_OUTPATIENT_CLINIC_OR_DEPARTMENT_OTHER): Payer: Medicare Other | Admitting: Physical Medicine & Rehabilitation

## 2018-08-10 ENCOUNTER — Encounter: Payer: Medicare Other | Attending: Physical Medicine & Rehabilitation

## 2018-08-10 VITALS — BP 131/74 | HR 73 | Ht 63.0 in | Wt 136.0 lb

## 2018-08-10 DIAGNOSIS — M1612 Unilateral primary osteoarthritis, left hip: Secondary | ICD-10-CM | POA: Insufficient documentation

## 2018-08-10 DIAGNOSIS — M545 Low back pain: Secondary | ICD-10-CM | POA: Insufficient documentation

## 2018-08-10 DIAGNOSIS — G588 Other specified mononeuropathies: Secondary | ICD-10-CM

## 2018-08-10 DIAGNOSIS — M419 Scoliosis, unspecified: Secondary | ICD-10-CM | POA: Insufficient documentation

## 2018-08-10 DIAGNOSIS — M940 Chondrocostal junction syndrome [Tietze]: Secondary | ICD-10-CM

## 2018-08-10 NOTE — Patient Instructions (Signed)
I feel the arthritis in your lumbar area is causing Left sided Low back pain, the scoliosis is contributing A lumbar medial branch block may be helpful if Salon Pas patch is not effective.  Either Dr Ethelene Hal or myself can perform that procedure

## 2018-08-10 NOTE — Progress Notes (Signed)
Subjective:    Patient ID: Alexandra Reyes, female    DOB: 01/19/1926, 85 y.o.   MRN: [id number]  HPI  85 year old female with primary complaint today left low back pain.  She denies any falls or any trauma to that area.  She has a prior history of lumbar scoliosis.  She has a functional leg length discrepancy due to this and has been advised to wear a heel lift on the left side RIght intercostal /Costochondritis pain overall improved , weaning down on Gabapentin 100mg  No longer using diclofenac or Salon Pas patch  Takes gabapentin at 10a , 4p, and 10p  Still has Right sided RIb pain due to bra  Left hip pain has re occurred Last Left intra articular hip injection Feb 8 which was helpful for hip pain Has residual low back pain which has some relief from salon Pas and takes 1 tramadol per day   Pain Inventory Average Pain 6 Pain Right Now 0 My pain is aching  In the last 24 hours, has pain interfered with the following? General activity 6 Relation with others 3 Enjoyment of life 3 What TIME of day is your pain at its worst? daytime Sleep (in general) Fair  Pain is worse with: walking, bending and standing Pain improves with: rest, heat/ice and medication Relief from Meds: 4  Mobility use a cane ability to climb steps?  yes do you drive?  yes  Function retired  Neuro/Psych No problems in this area  Prior Studies Any changes since last visit?  no  Physicians involved in your care Any changes since last visit?  no   Family History  Problem Relation Age of Onset  . Heart failure Mother   . Arthritis Mother   . Kidney failure Father   . Heart disease Father   . Hypertension Father    Social History   Socioeconomic History  . Marital status: Widowed    Spouse name: Not on file  . Number of children: 2  . Years of education: 33  . Highest education level: Not on file  Occupational History  . Occupation: Retired Designer, jewellery  Social Needs  .  Financial resource strain: Not hard at all  . Food insecurity:    Worry: Never true    Inability: Never true  . Transportation needs:    Medical: No    Non-medical: No  Tobacco Use  . Smoking status: Former Smoker    Packs/day: 1.50    Years: 36.00    Pack years: 54.00    Types: Cigarettes    Start date: 03/01/1959    Last attempt to quit: 06/10/1994    Years since quitting: 24.1  . Smokeless tobacco: Never Used  Substance and Sexual Activity  . Alcohol use: No    Frequency: Never  . Drug use: No  . Sexual activity: Never  Lifestyle  . Physical activity:    Days per week: 0 days    Minutes per session: 0 min  . Stress: Not at all  Relationships  . Social connections:    Talks on phone: More than three times a week    Gets together: More than three times a week    Attends religious service: More than 4 times per year    Active member of club or organization: Yes    Attends meetings of clubs or organizations: More than 4 times per year    Relationship status: Widowed  Other Topics Concern  . Not on  file  Social History Narrative   Originally from [place], [place]. She moved to [place] in 1950 during the polio epidemic. 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 Remote bird exposure. No mold or hot tub exposure.    Past Surgical History:  Procedure Laterality Date  . ABDOMINAL HYSTERECTOMY  1988  . BREAST BIOPSY Right 1948   "it was ok"  . CARDIAC CATHETERIZATION    . CARDIOVASCULAR STRESS TEST  03/08/2009   EF 84%  . CATARACT EXTRACTION W/ INTRAOCULAR LENS  IMPLANT, BILATERAL Bilateral   . INGUINAL HERNIA REPAIR Right 06/23/2017   Procedure: REPAIR INCARCERATED  RIGHT FEMORAL HERNIA WITH MESH;  Surgeon: Harriette Bouillon, MD;  Location: [facility] OR;  Service: General;  Laterality: Right;  . KNEE ARTHROSCOPY Right   . RIGHT/LEFT HEART CATH AND CORONARY ANGIOGRAPHY N/A 07/25/2016   Procedure: Right/Left Heart Cath and Coronary Angiography;  Surgeon: Kathleene Hazel, MD;   Location: [facility] INVASIVE CV LAB;  Service: Cardiovascular;  Laterality: N/A;  . SVT ABLATION  09/19/2016  . SVT ABLATION N/A 09/19/2016   Procedure: SVT Ablation;  Surgeon: Hillis Range, MD;  Location: [facility] INVASIVE CV LAB;  Service: Cardiovascular;  Laterality: N/A;  . TONSILLECTOMY AND ADENOIDECTOMY    . US ECHOCARDIOGRAPHY  01/17/2003   EF 55-60%   Past Medical History:  Diagnosis Date  . Age-related macular degeneration, wet, both eyes (HCC)   . Alcohol dependence in remission (HCC) 05/22/2011  . Allergic rhinitis, cause unspecified 05/22/2011  . Allergy   . Anemia, iron deficiency 05/18/2011  . Cholelithiasis   . COPD (chronic obstructive pulmonary disease) (HCC)   . DDD (degenerative disc disease), lumbar 05/18/2011  . Depression    "@ times" (09/19/2016)  . First degree AV block   . GERD (gastroesophageal reflux disease)   . Hiatal hernia   . History of blood transfusion    "3 or 4 related to childbirth"  . HTN (hypertension) 05/18/2011   pt denies this hx on 09/19/2016  . Hyperlipidemia 05/18/2011  . Macular degeneration   . Myocardial infarction (HCC)    "EKG shows I've had 2; date unknown" (09/19/2016)  . OA (osteoarthritis)    "a little here and there; mainly in the back" (09/19/2016)  . Osteoporosis 05/18/2011  . PAT (paroxysmal atrial tachycardia) (HCC)   . Pectus excavatum 05/18/2011  . Personal history of colonic polyps 10/28/2012  . Pneumonia    "as a child"  . SVT (supraventricular tachycardia) (HCC)   . Urinary frequency    BP 131/74   Pulse 73   Ht 5\' 3"  (1.6 m)   Wt 136 lb (61.7 kg)   LMP  (LMP Unknown)   SpO2 95%   BMI 24.09 kg/m   Opioid Risk Score:   Fall Risk Score:  `1  Depression screen PHQ 2/9  Depression screen [facility] 2/9 08/10/2018 12/29/2017 11/10/2017 09/22/2017 08/26/2017 07/04/2017 12/25/2016  Decreased Interest 0 0 0 0 0 0 0  Down, Depressed, Hopeless 0 1 0 0 0 1 1  PHQ - 2 Score 0 1 0 0 0 1 1  Altered sleeping - 1 - - - - 1  Tired, decreased energy - 1  - - - - 1  Change in appetite - 1 - - - - 1  Feeling bad or failure about yourself  - 0 - - - - 0  Trouble concentrating - 0 - - - - 0  Moving slowly or fidgety/restless - 0 - - - - 0  Suicidal thoughts - - - - - - 0  PHQ-9 Score - 4 - - - - 4  Difficult doing work/chores - Not difficult at all - - - - Not difficult at all     Review of Systems  Constitutional: Negative.   HENT: Negative.   Eyes: Negative.   Respiratory: Positive for cough and shortness of breath.   Cardiovascular: Negative.   Gastrointestinal: Negative.   Endocrine: Negative.   Genitourinary: Negative.   Musculoskeletal: Negative.   Skin: Negative.   Allergic/Immunologic: Negative.   Neurological: Negative.   Hematological: Negative.   Psychiatric/Behavioral: Negative.   All other systems reviewed and are negative.      Objective:   Physical Exam Vitals signs and nursing note reviewed.  Constitutional:      Appearance: Normal appearance. She is normal weight.  HENT:     Head: Normocephalic and atraumatic.     Nose: Nose normal.     Mouth/Throat:     Mouth: Mucous membranes are moist.     Pharynx: Oropharynx is clear.  Eyes:     Extraocular Movements: Extraocular movements intact.     Conjunctiva/sclera: Conjunctivae normal.     Pupils: Pupils are equal, round, and reactive to light.  Musculoskeletal:     Lumbar back: She exhibits decreased range of motion, tenderness and deformity.       Back:     Comments: Lumbar scoliosis around L4 convex to left Tenderness to palpation in the left lumbar paraspinals.  There is also pain with lateral bending more so toward the left side than to the right side.  There is pain with lumbar rotation.  No pain with forward flexion increased pain with lumbar extension. Hip without pain during range of motion Negative straight leg raising Ambulates without assistive device no evidence of toe drag or knee instability there is a functional leg length discrepancy left  pelvis sitting lower than right pelvis.   Neurological:     General: No focal deficit present.     Mental Status: She is alert and oriented to person, place, and time.  Psychiatric:        Mood and Affect: Mood normal.        Behavior: Behavior normal.        Thought Content: Thought content normal.        Judgment: Judgment normal.           Assessment & Plan:  1.  Left-sided low back pain in a patient with scoliosis and deformity.  This is likely originating from lumbar facet joints L4-5 L5-S1.  Would recommend diagnostic medial branch blocks to further evaluate.  Discussed with patient agrees with plan  2.  Osteoarthritis left hip doing better after injection under fluoroscopic guidance by Dr. Ethelene Hal  3.  Costochondritis and intercostal neuralgia more less have improved still has occasional pain over the right lower lateral ribs

## 2018-08-12 ENCOUNTER — Ambulatory Visit (INDEPENDENT_AMBULATORY_CARE_PROVIDER_SITE_OTHER): Payer: Medicare Other | Admitting: Acute Care

## 2018-08-12 ENCOUNTER — Encounter: Payer: Self-pay | Admitting: Acute Care

## 2018-08-12 ENCOUNTER — Ambulatory Visit: Payer: Medicare Other | Admitting: Adult Health

## 2018-08-12 VITALS — BP 110/60 | HR 69 | Ht 63.0 in | Wt 137.6 lb

## 2018-08-12 DIAGNOSIS — J449 Chronic obstructive pulmonary disease, unspecified: Secondary | ICD-10-CM

## 2018-08-12 DIAGNOSIS — J479 Bronchiectasis, uncomplicated: Secondary | ICD-10-CM

## 2018-08-12 DIAGNOSIS — J849 Interstitial pulmonary disease, unspecified: Secondary | ICD-10-CM | POA: Diagnosis not present

## 2018-08-12 NOTE — Assessment & Plan Note (Signed)
Compliant with Symbicort 1 puff twice daily Plan: Continue Symbicort 80 mg  1 puff twice daily Rinse mouth after use

## 2018-08-12 NOTE — Progress Notes (Signed)
History of Present Illness Alexandra Reyes is a 85 y.o. female with COPD, Allergic rhinitis, ILD,  Right sided intercostal neuralgia and GERD.She is followed by Dr. Lake Bells.   08/12/2018 6 month follow up:  Pt returns for follow up. She states she is doing well. She is complaining of some shortness of breath with exertion. She is concerned this is worsening pulmonary fibrosis. She had been using her saline irrigations, but she was still having thick white secretions , so she stopped the irrigations.  She feels the saline nasal drops work better. She is using her Zyrtec daily and also her Nasacort drops at bedtime.. She is using her Symbicort 1 puff twice daily.She feels this has been working well. She is compliant with her Prilosec daily. She is wearing her compression hose daily. She denies fever, chest pain, orthopnea or hemoptysis.  Of note she does complain of hip pain and arthritis which bothers her because she wants to remain  very active.She enjoys yard work which includes chopping down bamboo stalks.   Test Results:  PFT 07/12/15: FVC 2.29 L (106%) FEV1 1.57 L (99%) FEV1/FVC 0.68 FEF 25-75 0.78 L (81%) no bronchodilator response TLC 3.59 L (73%) RV 52% ERV 342% DLCO uncorrected 52% 05/14/13: FVC 2.34 L (103%) FEV1 1.63 L (97%) FEV1/FVC 0.69 FEF 25-75 0.83 L (76%) no bronchodilator response TLC 4.51 L (92%) ERV 284% DLCO uncorrected 55%  IMAGING CT CHEST W/O 04/03/16: Images independently reviewed showing some intralobular septal thickening and intralobular nodules in the bases predominantly, no other reticular change, no honeycombing  CT CHEST W/O 10/09/15 :  No pneumothorax or effusion. Minimal posterior subsegmental atelectasis. No appreciable bronchiectasis, mass, or nodule. No pathologic mediastinal adenopathy. No pericardial effusion. Mild coronary artery calcification.  BARIUM SWALLOW (08/11/15): No evidence for stricture or mass lesion. Moderate esophageal dysmotility without  hiatal hernia. Small amount spontaneous reflux into the lower third of the esophagus.  CTA CHEST 08/03/15: Mild lower lobe predominant atelectasis. No other nodule or opacity appreciated. No ulnar emboli. No pleural effusion or thickening. No pericardial effusion. No pathologic mediastinal adenopathy.  CARDIAC LHC (07/25/16):  Prox RCA lesion, 100 %stenosed.  Prox LAD lesion, 20 %stenosed.  LV end diastolic pressure is normal.  1. 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 The mid and distal vessel fills briskly from left to right collaterals.  2. Mild non-obstructive disease in the LAD 3. Normal filling pressures.  4. N specific ormal PA pressures  TTE (07/07/15): Narrow LVOT with normal cavity size. Mild LVH. EF 55-60%. Grade 1 diastolic dysfunction. LA & RA normal in size. RV normal in size and function. No aortic regurgitation. No mitral stenosis or regurgitation. No pulmonic regurgitation. Mild tricuspid regurgitation. No pericardial effusion.  LABS 04/17/16 CRP:  0.2 ESR:  19 ANA:  Negative Anti-CCP:  <16 RF:  <14 SCL-70:  <1.0 Hypersensitivity Pneumonitis Panel:  Negative   08/02/15 CBC: 9.2/14.4/42.7/390 BMP: 133/4.3/98/28/17/1.09/94/9.5 LFT: 4.2/7.0/0.5/62/18/16    CBC Latest Ref Rng & Units 06/17/2018 12/15/2017 06/25/2017  WBC 4.0 - 10.5 K/uL 8.7 7.8 14.6(H)  Hemoglobin 12.0 - 15.0 g/dL 15.1(H) 14.7 14.3  Hematocrit 36.0 - 46.0 % 45.1 43.7 42.1  Platelets 150.0 - 400.0 K/uL 423.0(H) 407.0(H) 337    BMP Latest Ref Rng & Units 06/17/2018 12/15/2017 06/25/2017  Glucose 70 - 99 mg/dL 86 75 106(H)  BUN 6 - 23 mg/dL _0 Creatinine 0.40 - 1.20 mg/dL 0.74 0.69 0.98  BUN/Creat Ratio 12 - 28 - - -  Sodium 135 - 145 mEq/L 134(L) 133(L) 135  Potassium 3.5 - 5.1 mEq/L 4.2 4.6 3.5  Chloride 96 - 112 mEq/L 99 98 103  CO2 19 - 32 mEq/L _0 Calcium 8.4 - 10.5 mg/dL 9.2 8.9 8.3(L)    BNP No results found for: BNP  ProBNP    Component Value Date/Time    PROBNP 104.0 (H) 02/07/2011 1125    PFT    Component Value Date/Time   FEV1PRE 1.57 07/12/2015 1552   FEV1POST 1.76 07/12/2015 1552   FVCPRE 2.29 07/12/2015 1552   FVCPOST 2.39 07/12/2015 1552   TLC 4.51 04/28/2013 1158   DLCOUNC 12.09 07/12/2015 1552   PREFEV1FVCRT 68 07/12/2015 1552   PSTFEV1FVCRT 74 07/12/2015 1552    No results found.   Past medical hx Past Medical History:  Diagnosis Date  . Age-related macular degeneration, wet, both eyes ([place])   . Alcohol dependence in remission ([place]) 05/22/2011  . Allergic rhinitis, cause unspecified 05/22/2011  . Allergy   . Anemia, iron deficiency 05/18/2011  . Cholelithiasis   . COPD (chronic obstructive pulmonary disease) ([place])   . DDD (degenerative disc disease), lumbar 05/18/2011  . Depression    "@ times" (09/19/2016)  . First degree AV block   . GERD (gastroesophageal reflux disease)   . Hiatal hernia   . History of blood transfusion    "3 or 4 related to childbirth"  . HTN (hypertension) 05/18/2011   pt denies this hx on 09/19/2016  . Hyperlipidemia 05/18/2011  . Macular degeneration   . Myocardial infarction ([place])    "EKG shows I've had 2; date unknown" (09/19/2016)  . OA (osteoarthritis)    "a little here and there; mainly in the back" (09/19/2016)  . Osteoporosis 05/18/2011  . PAT (paroxysmal atrial tachycardia) ([place])   . Pectus excavatum 05/18/2011  . Personal history of colonic polyps 10/28/2012  . Pneumonia    "as a child"  . SVT (supraventricular tachycardia) ([place])   . Urinary frequency      Social History   Tobacco Use  . Smoking status: Former Smoker    Packs/day: 1.50    Years: 36.00    Pack years: 54.00    Types: Cigarettes    Start date: 03/01/1959    Last attempt to quit: 06/10/1994    Years since quitting: 24.1  . Smokeless tobacco: Never Used  Substance Use Topics  . Alcohol use: No    Frequency: Never  . Drug use: No    Ms.Tupy reports that she quit smoking about 24 years ago. Her smoking  use included cigarettes. She started smoking about 59 years ago. She has a 54.00 pack-year smoking history. She has never used smokeless tobacco. She reports that she does not drink alcohol or use drugs.  Tobacco Cessation: Former smoker , quit 1996, 54 pack years   Past surgical hx, Family hx, Social hx all reviewed.  Current Outpatient Medications on File Prior to Visit  Medication Sig  . acetaminophen (TYLENOL) 500 MG tablet Take 1,000 mg by mouth 2 (two) times daily as needed for moderate pain or headache.  . ALPRAZolam (XANAX) 0.25 MG tablet TAKE 1 TABLET BY MOUTH AT BEDTIME AS NEEDED FOR ANXIETY  . amLODipine (NORVASC) 5 MG tablet TAKE ONE TABLET BY MOUTH DAILY  . Ascorbic Acid (VITAMIN C PO) Take 1 tablet by mouth daily. Reported on 08/08/2015  . aspirin 81 MG tablet Take 81 mg by mouth at bedtime.   . B Complex Vitamins (  VITAMIN B COMPLEX PO) Take 1 tablet by mouth daily.   . budesonide-formoterol (SYMBICORT) 80-4.5 MCG/ACT inhaler INHALE 1 PUFF INTO THE LUNGS TWICE A DAY AS DIRECTED  . calcium carbonate (OS-CAL) 600 MG tablet Take 600 mg by mouth daily.  Marland Kitchen diltiazem (CARDIZEM) 30 MG tablet Take 1 tablet (30 mg total) by mouth 4 (four) times daily as needed.  . diltiazem (CARTIA XT) 120 MG 24 hr capsule Take 1 capsule (120 mg total) by mouth daily.  Marland Kitchen docusate sodium (COLACE) 100 MG capsule Take 100 mg by mouth daily.   . furosemide (LASIX) 40 MG tablet TAKE ONE TABLET BY MOUTH DAILY *PLEASE KEEP UPCOMING NOVEMBER APPOINTMENT FOR FUTURE REFILLS*  . gabapentin (NEURONTIN) 100 MG capsule TAKE ONE CAPSULE BY MOUTH THREE TIMES A DAY  . Glucosamine HCl-MSM (GLUCOSAMINE-MSM PO) Take 2 tablets by mouth daily.   . nitroGLYCERIN (NITROSTAT) 0.4 MG SL tablet Place 1 tablet (0.4 mg total) under the tongue every 5 (five) minutes as needed for chest pain.  Marland Kitchen omeprazole (PRILOSEC) 40 MG capsule TAKE ONE CAPSULE BY MOUTH DAILY  . PRESCRIPTION MEDICATION Apply 1 Dose to eye every 3 (three) months.  Eylea - eye injection  . simvastatin (ZOCOR) 10 MG tablet Take 1 tablet (10 mg total) by mouth daily.  Marland Kitchen tobramycin (TOBREX) 0.3 % ophthalmic solution Place 1 drop into the right eye 4 (four) times daily. Use the day before, the day of, and the day after eye injections  . traMADol (ULTRAM) 50 MG tablet Take 1 tablet (50 mg total) by mouth 2 (two) times daily as needed.  . triamcinolone cream (KENALOG) 0.1 % Apply 1 application topically daily as needed (hives). Apply to affected area  . vitamin E 200 UNIT capsule Take 200 Units by mouth daily.  . budesonide-formoterol (SYMBICORT) 80-4.5 MCG/ACT inhaler Symbicort 80 mcg-4.5 mcg/actuation HFA aerosol inhaler   No current facility-administered medications on file prior to visit.      Allergies  Allergen Reactions  . Symbicort [Budesonide-Formoterol Fumarate]     Jittery in high doses, this has been reduced and is tolerated at this time.     Review Of Systems:  Constitutional:   No  weight loss, night sweats,  Fevers, chills, fatigue, or  lassitude.  HEENT:   No headaches,  Difficulty swallowing,  Tooth/dental problems, or  Sore throat,                No sneezing, itching, ear ache, nasal congestion, post nasal drip,   CV:  No chest pain,  Orthopnea, PND, swelling in lower extremities, anasarca, dizziness, palpitations, syncope.   GI  No heartburn, indigestion, abdominal pain, nausea, vomiting, diarrhea, change in bowel habits, loss of appetite, bloody stools.   Resp: + shortness of breath with exertion or at rest.  + white  excess mucus, + occasional  productive cough,  No non-productive cough,  No coughing up of blood.  No change in color of mucus.  No wheezing.  No chest wall deformity  Skin: no rash or lesions.  GU: no dysuria, change in color of urine, no urgency or frequency.  No flank pain, no hematuria   MS:  + joint ( hip) pain or swelling.  No decreased range of motion.  + back pain.  Psych:  No change in mood or affect. No  depression or anxiety.  No memory loss.   Vital Signs BP 110/60 (BP Location: Right Arm, Cuff Size: Normal)   Pulse 69   Ht 5'  3" (1.6 m)   Wt 137 lb 9.6 oz (62.4 kg)   LMP  (LMP Unknown)   SpO2 97%   BMI 24.37 kg/m    Physical Exam:  General- No distress,  A&Ox3, pleasant ENT: No sinus tenderness, TM clear, pale nasal mucosa, no oral exudate,no post nasal drip, no LAN Cardiac: S1, S2, regular rate and rhythm, no murmur Chest: No wheeze/ rales/ dullness; no accessory muscle use, no nasal flaring, no sternal retractions, few crackles per bases Abd.: Soft Non-tender, ND, BS + Ext: No clubbing cyanosis, edema, she is wearing compression stockings Neuro:  normal strength, MAE x 4, A&O x 3 Skin: No rashes, warm and dry, intact Psych: normal mood and behavior   Assessment/Plan  ILD (interstitial lung disease) (HCC) Continued Dyspnea with exertion that she feels is a bit worse than 6 months ago Last PFT 2017 Few crackles per bases bilaterally on exam Plan: Repeat PFT's If worsening DLCO, then consider repeat HRCT, 6 minute walk  COPD (chronic obstructive pulmonary disease) Compliant with Symbicort 1 puff twice daily Plan: Continue Symbicort 80 mg  1 puff twice daily Rinse mouth after use  Allergic rhinitis, cause unspecified Continued issues which she is managing with maintenance Plan: Continue using your zyrtec daily and your Nasacort at bedtime as you have been doing. Continue using nasal saline as you have been doing Add saline rinses for flares   Bronchiectasis without acute exacerbation (HCC) Continued secretions that are white Plan: Mucinex 600 mg once daily with full glass of water Consider Flutter valve for mucillary clearance  This appointment was 45 min long with over 50% of the time in direct face-to-face patient care, assessment, plan of care, and follow-up.   Magdalen Spatz, NP 08/12/2018  2:35 PM

## 2018-08-12 NOTE — Assessment & Plan Note (Signed)
Continued issues which she is managing with maintenance Plan: Continue using your zyrtec daily and your Nasacort at bedtime as you have been doing. Continue using nasal saline as you have been doing Add saline rinses for flares

## 2018-08-12 NOTE — Progress Notes (Signed)
Reviewed, agree 

## 2018-08-12 NOTE — Patient Instructions (Addendum)
It is good to see you today. We will schedule you for a PFT to evaluate your pulmonary function. If your pulmonary function has declined, we will consider a CT to look for progression of your lung disease. We will call you with the results. Continue your Symbicort 1 puff twice daily as you have been doing. Rinse mouth after use. Add Mucinex 600 mg once daily , start with every other day, with a full glass of water to thin the mucus you are having with your cough. Continue using your zyrtec daily and your Nasacort at bedtime as you have been doing. Continue your Prilosec for your reflux. Continue pain management clinic as you have been doing for your Right sided intercostal neuralgia . Follow up in 6 months with Dr. Kendrick Fries or Maralyn Sago NP. Please contact office for sooner follow up if symptoms do not improve or worsen or seek emergency care

## 2018-08-12 NOTE — Assessment & Plan Note (Signed)
Continued secretions that are white Plan: Mucinex 600 mg once daily with full glass of water Consider Flutter valve for mucillary clearance

## 2018-08-12 NOTE — Assessment & Plan Note (Signed)
Continued Dyspnea with exertion that she feels is a bit worse than 6 months ago Last PFT 2017 Few crackles per bases bilaterally on exam Plan: Repeat PFT's If worsening DLCO, then consider repeat HRCT, 6 minute walk

## 2018-08-17 ENCOUNTER — Ambulatory Visit: Payer: Medicare Other

## 2018-08-17 ENCOUNTER — Ambulatory Visit: Payer: Medicare Other | Admitting: Physical Medicine & Rehabilitation

## 2018-08-20 DIAGNOSIS — M47817 Spondylosis without myelopathy or radiculopathy, lumbosacral region: Secondary | ICD-10-CM | POA: Insufficient documentation

## 2018-08-20 DIAGNOSIS — M79672 Pain in left foot: Secondary | ICD-10-CM | POA: Diagnosis not present

## 2018-08-20 DIAGNOSIS — M79671 Pain in right foot: Secondary | ICD-10-CM | POA: Diagnosis not present

## 2018-08-26 ENCOUNTER — Ambulatory Visit (INDEPENDENT_AMBULATORY_CARE_PROVIDER_SITE_OTHER): Payer: Medicare Other | Admitting: Pulmonary Disease

## 2018-08-26 ENCOUNTER — Other Ambulatory Visit: Payer: Self-pay

## 2018-08-26 DIAGNOSIS — J849 Interstitial pulmonary disease, unspecified: Secondary | ICD-10-CM

## 2018-08-26 DIAGNOSIS — J449 Chronic obstructive pulmonary disease, unspecified: Secondary | ICD-10-CM

## 2018-08-26 LAB — PULMONARY FUNCTION TEST
DL/VA % pred: 89 %
DL/VA: 3.6 ml/min/mmHg/L
DLCO UNC % PRED: 71 %
DLCO unc: 12.5 ml/min/mmHg
FEF 25-75 PRE: 1.26 L/s
FEF 25-75 Post: 1.42 L/sec
FEF2575-%Change-Post: 13 %
FEF2575-%Pred-Post: 173 %
FEF2575-%Pred-Pre: 153 %
FEV1-%Change-Post: 2 %
FEV1-%Pred-Post: 117 %
FEV1-%Pred-Pre: 114 %
FEV1-Post: 1.74 L
FEV1-Pre: 1.7 L
FEV1FVC-%Change-Post: 1 %
FEV1FVC-%Pred-Pre: 104 %
FEV6-%Change-Post: 0 %
FEV6-%Pred-Post: 120 %
FEV6-%Pred-Pre: 120 %
FEV6-POST: 2.27 L
FEV6-Pre: 2.27 L
FEV6FVC-%Change-Post: 0 %
FEV6FVC-%Pred-Post: 106 %
FEV6FVC-%Pred-Pre: 107 %
FVC-%Change-Post: 0 %
FVC-%Pred-Post: 113 %
FVC-%Pred-Pre: 112 %
FVC-Post: 2.29 L
FVC-Pre: 2.27 L
Post FEV1/FVC ratio: 76 %
Post FEV6/FVC ratio: 99 %
Pre FEV1/FVC ratio: 75 %
Pre FEV6/FVC Ratio: 100 %
RV % pred: 107 %
RV: 2.74 L
TLC % pred: 99 %
TLC: 4.86 L

## 2018-08-26 NOTE — Progress Notes (Signed)
Full PFT performed today. °

## 2018-08-31 ENCOUNTER — Other Ambulatory Visit: Payer: Self-pay | Admitting: Physical Medicine & Rehabilitation

## 2018-09-11 ENCOUNTER — Telehealth: Payer: Self-pay | Admitting: Pulmonary Disease

## 2018-09-11 NOTE — Telephone Encounter (Signed)
Please make a tele-visit to discuss results with one of the APPs next week. Thanks.

## 2018-09-11 NOTE — Telephone Encounter (Signed)
Spoke with patient. She has been scheduled with SG for 09/14/18 at 130 for a televisit. She is aware of the process and is ok with this. Verbalized understanding. Nothing further needed at time of call.

## 2018-09-11 NOTE — Telephone Encounter (Signed)
BQ ordered PFT which was performed on 3/18. Pt did not have appt following PFT. Inquiring resutls.

## 2018-09-14 ENCOUNTER — Other Ambulatory Visit: Payer: Self-pay

## 2018-09-14 ENCOUNTER — Encounter: Payer: Self-pay | Admitting: Acute Care

## 2018-09-14 ENCOUNTER — Ambulatory Visit (INDEPENDENT_AMBULATORY_CARE_PROVIDER_SITE_OTHER): Payer: Medicare Other | Admitting: Acute Care

## 2018-09-14 DIAGNOSIS — J849 Interstitial pulmonary disease, unspecified: Secondary | ICD-10-CM | POA: Diagnosis not present

## 2018-09-14 NOTE — Patient Instructions (Addendum)
It was nice to speak with you today.  Continue Symbicort 1 puff once daily every day Rinse mouth after use Continue Zyrtec daily as you have been doing. Continue Saline drops for sinus irrigation as needed. Continue Mucinex with a full glass of water for clearance of mucus  Consider HRCT at follow up to evaluate for progression of ILD  Consider 6 minute walk We will Refer to Pulmonary Rehab at follow up. We will consider a flutter valve at follow up.  Follow up 6/22/ at 11 am with Maralyn Sago NP   Remember because of your underlying pulmonary disease you are at higher risk for complications of Covid 19 should you contract the virus. Continue to self isolate, wash hand frequently, and wear a face mask if you leave your home. Remember to utilize the early hours at grocery stores/ drug stores  for people > 60 years old or any underlying health risks, as the stores are cleaner and less crowded at these times. Please do not hesitate to call us if you need [place] before your regularly schedule appointment.

## 2018-09-14 NOTE — Progress Notes (Signed)
Virtual Visit via Telephone Note  I connected with Alexandra Reyes on 09/14/18 at  1:30 PM EDT by telephone and verified that I am speaking with the correct person using two identifiers.   I discussed the limitations, risks, security and privacy concerns of performing an evaluation and management service by telephone and the availability of in person appointments. I also discussed with the patient that there may be a patient responsible charge related to this service. The patient expressed understanding and agreed to proceed.  I  confirmed date of birth and address to authenticate patient  identity. My nurse Quentin Ore reviewed medications and ordered any refills required.  Synopsis Alexandra Reyes is a 85 y.o. female with COPD, Allergic rhinitis, ILD,  Right sided intercostal neuralgia and GERD.She is followed by Dr. Lake Bells.  Tobacco Cessation: Former smoker , quit 1996, 54 pack years   History of Present Illness: Patient noted worsening shortness of breath with dyspnea. Last PFT's were done 2017. She had repeat PFT's on 3/18. She did use her Symbicort at 7 am the day of the PFT's per the tech note.  She is continuing to use her Symbicort as directed. She takes one puff once daily.She states she has continues dyspnea with exertion. She is still coughing up mucus, and she is using the Mucinex which enables her to cough the sputum up. She says her sputum production is worse in the morning and clears in the afternoon.She is using her Symbicort 1 puff daily as the two puffs was too much.She is using saline drops for sinus clearance. She is compliant with Zyrtec every day.She states the sternal pain she was having in her 08/2018 appointment  has resolved as her cough got better.She denies any fever,chest pain ,orthopnea or hemoptysis.  Observations/Objective:  PFT 08/26/2018 FVC 2.27 ( 112%), FEV1 1.70 ( 114%), F/F Ratio 75 ( 104%), FEF 25-75 0.75L 153%), no significant BD response, TLC 4.86  ( 99%), Uncorrected DLCO 71% >> Pt. Used Symbicort  at 7 am , PFT's were done at 11 am. 07/12/15: FVC 2.29 L (106%) FEV1 1.57 L (99%) FEV1/FVC 0.68 FEF 25-75 0.78 L (81%) no bronchodilator response TLC 3.59 L (73%) RV 52% ERV 342% DLCO uncorrected 52% 05/14/13: FVC 2.34 L (103%) FEV1 1.63 L (97%) FEV1/FVC 0.69 FEF 25-75 0.83 L (76%) no bronchodilator response TLC 4.51 L (92%) ERV 284% DLCO uncorrected 55%  IMAGING CT CHEST W/O 04/03/16:Images independently reviewed showing some intralobular septal thickening and intralobular nodules in the bases predominantly, no other reticular change, no honeycombing  CT CHEST W/O 10/09/15 : No pneumothorax or effusion. Minimal posterior subsegmental atelectasis. No appreciable bronchiectasis, mass, or nodule. No pathologic mediastinal adenopathy. No pericardial effusion. Mild coronary artery calcification.  BARIUM SWALLOW (08/11/15):No evidence for stricture or mass lesion. Moderate esophageal dysmotility without hiatal hernia. Small amount spontaneous reflux into the lower third of the esophagus.  CTA CHEST 08/03/15: Mild lower lobe predominant atelectasis. No other nodule or opacity appreciated. No ulnar emboli. No pleural effusion or thickening. No pericardial effusion. No pathologic mediastinal adenopathy.  CARDIAC LHC (07/25/16):  Prox RCA lesion, 100 %stenosed.  Prox LAD lesion, 20 %stenosed.  LV end diastolic pressure is normal.  1. 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 The mid and distal vessel fills briskly from left to right collaterals.  2. Mild non-obstructive disease in the LAD 3. Normal filling pressures.  4.  Normal PA pressures  TTE (07/07/15): Narrow LVOT with normal cavity size. Mild LVH. EF 55-60%.  Grade 1 diastolic dysfunction. LA &RA normal in size. RV normal in size and function. No aortic regurgitation. No mitral stenosis or regurgitation. No pulmonic regurgitation. Mild tricuspid regurgitation. No pericardial  effusion.  LABS 04/17/16 CRP: 0.2 ESR: 19 ANA: Negative Anti-CCP: <16 RF: <14 SCL-70: <1.0 Hypersensitivity Pneumonitis Panel: Negative   08/02/15 CBC: 9.2/14.4/42.7/390 BMP: 133/4.3/98/28/17/1.09/94/9.5 LFT: 4.2/7.0/0.5/62/18/16   Assessment and Plan: ILD (interstitial lung disease) ([place]) Continued Dyspnea with exertion that she feels is a bit worse than 6 months ago Last PFT 08/2018 with Minimal Obstructive Airways Disease Mild response to bronchodilator Minimal Restriction -Parenchymal Moderately  Diffusion Defect ( Improved over last PFT) Plan: Continue Symbicort 1 puff once daily every day Rinse mouth after use Continue Zyrtec daily as you have been doing. Continue Saline drops for sinus irrigation as needed. Continue Mucinex with a full glass of water for clearance of mucus  Consider HRCT at follow up to evaluate  Consider 6 minute walk Refer to Pulmonary Rehab art follow up. Pt. Is not wanting to venture away from home until Covid threat is resolved. Follow up 6/22/ at 11 am with Judson Roch NP  COPD (chronic obstructive pulmonary disease) Compliant with Symbicort 1 puff twice daily Plan: Continue Symbicort 80 mg  1 puff twice daily Rinse mouth after use  Allergic rhinitis, cause unspecified Continued issues which she is managing with maintenance Plan: Continue using your zyrtec daily  Continue using nasal saline as you have been doing Add saline rinses for flares   Bronchiectasis without acute exacerbation ([place]) Continued secretions that are white Plan: Mucinex 600 mg once daily with full glass of water Consider Flutter valve for mucillary clearance at follow up in June     Follow Up Instructions: 6/22 at 11 am , with Judson Roch NP We will schedule follow up with Dr. Lake Bells once he is seeing patients in the office sometime after July 2020.    I discussed the assessment and treatment plan with the patient. The patient was provided an opportunity to  ask questions and all were answered. The patient agreed with the plan and demonstrated an understanding of the instructions.   The patient was advised to call back or seek an in-person evaluation if the symptoms worsen or if the condition fails to improve as anticipated.  I provided  25 minutes of non-face-to-face time during this encounter.   Magdalen Spatz, NP 09/14/2018 3:35 PM

## 2018-09-28 ENCOUNTER — Ambulatory Visit: Payer: Medicare Other | Admitting: Physical Medicine & Rehabilitation

## 2018-11-03 ENCOUNTER — Other Ambulatory Visit: Payer: Self-pay | Admitting: Physical Medicine & Rehabilitation

## 2018-11-06 DIAGNOSIS — N816 Rectocele: Secondary | ICD-10-CM | POA: Diagnosis not present

## 2018-11-09 ENCOUNTER — Encounter: Payer: Self-pay | Admitting: Physical Medicine & Rehabilitation

## 2018-11-09 ENCOUNTER — Encounter: Payer: Medicare Other | Attending: Physical Medicine & Rehabilitation | Admitting: Physical Medicine & Rehabilitation

## 2018-11-09 ENCOUNTER — Other Ambulatory Visit: Payer: Self-pay

## 2018-11-09 VITALS — BP 139/74 | HR 66 | Temp 98.0°F | Ht 63.0 in | Wt 135.0 lb

## 2018-11-09 DIAGNOSIS — M419 Scoliosis, unspecified: Secondary | ICD-10-CM | POA: Insufficient documentation

## 2018-11-09 DIAGNOSIS — G588 Other specified mononeuropathies: Secondary | ICD-10-CM

## 2018-11-09 DIAGNOSIS — M545 Low back pain: Secondary | ICD-10-CM | POA: Diagnosis not present

## 2018-11-09 DIAGNOSIS — M1612 Unilateral primary osteoarthritis, left hip: Secondary | ICD-10-CM | POA: Insufficient documentation

## 2018-11-09 DIAGNOSIS — M940 Chondrocostal junction syndrome [Tietze]: Secondary | ICD-10-CM

## 2018-11-09 MED ORDER — GABAPENTIN 100 MG PO CAPS: 100.0000 mg | ORAL_CAPSULE | Freq: Three times a day (TID) | ORAL | 11 refills | Status: DC

## 2018-11-09 NOTE — Progress Notes (Signed)
Subjective:    Patient ID: Alexandra Reyes, female    DOB: Feb 09, 1926, 85 y.o.   MRN: [id number]  HPI   Low back is doing ok, 1/8 in heel lift is helping  RIb and intercostal pain Gabapentin 100mg  TID with Tylenol in am takes tramadol in afternoon   Pain Inventory Average Pain 8 Pain Right Now 0 My pain is intermittent, dull and aching  In the last 24 hours, has pain interfered with the following? General activity 8 Relation with others 0 Enjoyment of life 0 What TIME of day is your pain at its worst? evening Sleep (in general) Fair  Pain is worse with: walking and standing Pain improves with: rest and medication Relief from Meds: 8  Mobility use a cane do you drive?  yes  Function retired  Neuro/Psych trouble walking  Prior Studies Any changes since last visit?  no  Physicians involved in your care Any changes since last visit?  no   Family History  Problem Relation Age of Onset  . Heart failure Mother   . Arthritis Mother   . Kidney failure Father   . Heart disease Father   . Hypertension Father    Social History   Socioeconomic History  . Marital status: Widowed    Spouse name: Not on file  . Number of children: 2  . Years of education: 7411  . Highest education level: Not on file  Occupational History  . Occupation: Retired Designer, jewelleryegistered Nurse  Social Needs  . Financial resource strain: Not hard at all  . Food insecurity:    Worry: Never true    Inability: Never true  . Transportation needs:    Medical: No    Non-medical: No  Tobacco Use  . Smoking status: Former Smoker    Packs/day: 1.50    Years: 36.00    Pack years: 54.00    Types: Cigarettes    Start date: 03/01/1959    Last attempt to quit: 06/10/1994    Years since quitting: 24.4  . Smokeless tobacco: Never Used  Substance and Sexual Activity  . Alcohol use: No    Frequency: Never  . Drug use: No  . Sexual activity: Never  Lifestyle  . Physical activity:    Days per week: 0  days    Minutes per session: 0 min  . Stress: Not at all  Relationships  . Social connections:    Talks on phone: More than three times a week    Gets together: More than three times a week    Attends religious service: More than 4 times per year    Active member of club or organization: Yes    Attends meetings of clubs or organizations: More than 4 times per year    Relationship status: Widowed  Other Topics Concern  . Not on file  Social History Narrative   [place] She moved to [place] in 1950 during the polio epidemic. Previously worked as a Engineer, civil (consulting)nurse. No pets currently. Remote bird exposure. No mold or hot tub exposure.    Past Surgical History:  Procedure Laterality Date  . ABDOMINAL HYSTERECTOMY  1988  . BREAST BIOPSY Right 1948   "it was ok"  . CARDIAC CATHETERIZATION    . CARDIOVASCULAR STRESS TEST  03/08/2009   EF 84%  . CATARACT EXTRACTION W/ INTRAOCULAR LENS  IMPLANT, BILATERAL Bilateral   . INGUINAL HERNIA REPAIR Right 06/23/2017   Procedure: REPAIR INCARCERATED  RIGHT FEMORAL HERNIA WITH  MESH;  Surgeon: Harriette Bouillon, MD;  Location: [facility] OR;  Service: General;  Laterality: Right;  . KNEE ARTHROSCOPY Right   . RIGHT/LEFT HEART CATH AND CORONARY ANGIOGRAPHY N/A 07/25/2016   Procedure: Right/Left Heart Cath and Coronary Angiography;  Surgeon: Kathleene Hazel, MD;  Location: [facility] INVASIVE CV LAB;  Service: Cardiovascular;  Laterality: N/A;  . SVT ABLATION  09/19/2016  . SVT ABLATION N/A 09/19/2016   Procedure: SVT Ablation;  Surgeon: Hillis Range, MD;  Location: [facility] INVASIVE CV LAB;  Service: Cardiovascular;  Laterality: N/A;  . TONSILLECTOMY AND ADENOIDECTOMY    . US ECHOCARDIOGRAPHY  01/17/2003   EF 55-60%   Past Medical History:  Diagnosis Date  . Age-related macular degeneration, wet, both eyes (HCC)   . Alcohol dependence in remission (HCC) 05/22/2011  . Allergic rhinitis, cause unspecified 05/22/2011  . Allergy   . Anemia, iron deficiency  05/18/2011  . Cholelithiasis   . COPD (chronic obstructive pulmonary disease) (HCC)   . DDD (degenerative disc disease), lumbar 05/18/2011  . Depression    "@ times" (09/19/2016)  . First degree AV block   . GERD (gastroesophageal reflux disease)   . Hiatal hernia   . History of blood transfusion    "3 or 4 related to childbirth"  . HTN (hypertension) 05/18/2011   pt denies this hx on 09/19/2016  . Hyperlipidemia 05/18/2011  . Macular degeneration   . Myocardial infarction (HCC)    "EKG shows I've had 2; date unknown" (09/19/2016)  . OA (osteoarthritis)    "a little here and there; mainly in the back" (09/19/2016)  . Osteoporosis 05/18/2011  . PAT (paroxysmal atrial tachycardia) (HCC)   . Pectus excavatum 05/18/2011  . Personal history of colonic polyps 10/28/2012  . Pneumonia    "as a child"  . SVT (supraventricular tachycardia) (HCC)   . Urinary frequency    BP 139/74   Pulse 66   Temp 98 F (36.7 C)   Ht  (1.6 m)   Wt 135 lb (61.2 kg)   LMP  (LMP Unknown)   SpO2 95%   BMI 23.91 kg/m   Opioid Risk Score:   Fall Risk Score:  `1  Depression screen PHQ 2/9  Depression screen [facility] 2/9 08/10/2018 12/29/2017 11/10/2017 09/22/2017 08/26/2017 07/04/2017 12/25/2016  Decreased Interest 0 0 0 0 0 0 0  Down, Depressed, Hopeless 0 1 0 0 0 1 1  PHQ - 2 Score 0 1 0 0 0 1 1  Altered sleeping - 1 - - - - 1  Tired, decreased energy - 1 - - - - 1  Change in appetite - 1 - - - - 1  Feeling bad or failure about yourself  - 0 - - - - 0  Trouble concentrating - 0 - - - - 0  Moving slowly or fidgety/restless - 0 - - - - 0  Suicidal thoughts - - - - - - 0  PHQ-9 Score - 4 - - - - 4  Difficult doing work/chores - Not difficult at all - - - - Not difficult at all     Review of Systems  Constitutional: Negative.   HENT: Negative.   Eyes: Negative.   Respiratory: Positive for apnea.   Cardiovascular: Negative.   Gastrointestinal: Negative.   Endocrine: Negative.   Genitourinary: Negative.    Musculoskeletal: Positive for gait problem.  Skin: Negative.   Allergic/Immunologic: Negative.   Hematological: Negative.   Psychiatric/Behavioral: Negative.   All other systems reviewed  and are negative.      Objective:   Physical Exam Vitals signs and nursing note reviewed.  Constitutional:      Appearance: Normal appearance.  HENT:     Head: Normocephalic.     Mouth/Throat:     Mouth: Mucous membranes are moist.  Eyes:     Extraocular Movements: Extraocular movements intact.     Pupils: Pupils are equal, round, and reactive to light.  Neck:     Musculoskeletal: Normal range of motion.  Cardiovascular:     Rate and Rhythm: Normal rate and regular rhythm.     Pulses: Normal pulses.  Skin:    General: Skin is warm and dry.  Neurological:     Mental Status: She is alert and oriented to person, place, and time.  Psychiatric:        Mood and Affect: Mood normal.        Behavior: Behavior normal.    Thoracic dextro scoliosis Lumbar levo scoliosis  Notenderness in the intercostal spaces RIght lateral ribs, no subcostal tenderness      Assessment & Plan:  1.  Costochondritis and intercostal neuritis Overall controlled on current regimen  Gabapentin 100mg  TID Tylenol qam Tramadol 50mg  qpm  2.  Lumbar scoliosis and spondylosis Symptomatically improved , monitor for recurrence  PMR f/u in 108mo

## 2018-11-18 ENCOUNTER — Telehealth: Payer: Self-pay

## 2018-11-20 NOTE — Telephone Encounter (Signed)
Left message regarding appt on 11/23/18. 

## 2018-11-20 NOTE — Telephone Encounter (Signed)
Spoke with pt regarding appt on 11/23/18. Pt stated she will check vitals prior to appt and did not have any questions at this moment.  

## 2018-11-23 ENCOUNTER — Telehealth (INDEPENDENT_AMBULATORY_CARE_PROVIDER_SITE_OTHER): Payer: Medicare Other | Admitting: Internal Medicine

## 2018-11-23 VITALS — BP 128/70 | HR 84 | Ht 63.0 in | Wt 135.0 lb

## 2018-11-23 DIAGNOSIS — I10 Essential (primary) hypertension: Secondary | ICD-10-CM

## 2018-11-23 DIAGNOSIS — I471 Supraventricular tachycardia: Secondary | ICD-10-CM | POA: Diagnosis not present

## 2018-11-23 DIAGNOSIS — I2510 Atherosclerotic heart disease of native coronary artery without angina pectoris: Secondary | ICD-10-CM | POA: Diagnosis not present

## 2018-11-23 NOTE — Progress Notes (Signed)
Electrophysiology TeleHealth Note  Due to national recommendations of social distancing due to [place] 19, an audio telehealth visit is felt to be most appropriate for this patient at this time.  Verbal consent was obtained by me for the telehealth visit today.  The patient does not have capability for a virtual visit.  A phone visit is therefore required today.  Date:  11/23/2018   ID:  Alexandra Reyes, DOB Feb 12, 1926, MRN [id number]  Location: patient's home  Provider location:  [place]  Evaluation Performed: Follow-up visit  PCP:  Biagio Borg, MD   Electrophysiologist:  Dr Rayann Heman  Chief Complaint:  palpitations  History of Present Illness:    Alexandra Reyes is a 85 y.o. female who presents via telehealth conferencing today.  Since last being seen in our clinic, the patient reports doing very well.  She is so pleased that she has not had tachycardia in a year and 10 months.  She states "where were you 50 years ago when my heart started racing?".   SOB is stable.  Chest wall pain is stable.  Today, she denies symptoms of palpitations, exertional chest pain,  lower extremity edema, dizziness, presyncope, or syncope.  The patient is otherwise without complaint today.  The patient denies symptoms of fevers, chills, cough, or new SOB worrisome for COVID 19.  Past Medical History:  Diagnosis Date  . Age-related macular degeneration, wet, both eyes ([place])   . Alcohol dependence in remission ([place]) 05/22/2011  . Allergic rhinitis, cause unspecified 05/22/2011  . Allergy   . Anemia, iron deficiency 05/18/2011  . Cholelithiasis   . COPD (chronic obstructive pulmonary disease) ([place])   . DDD (degenerative disc disease), lumbar 05/18/2011  . Depression    "@ times" (09/19/2016)  . First degree AV block   . GERD (gastroesophageal reflux disease)   . Hiatal hernia   . History of blood transfusion    "3 or 4 related to childbirth"  . HTN (hypertension) 05/18/2011   pt denies this hx  on 09/19/2016  . Hyperlipidemia 05/18/2011  . Macular degeneration   . Myocardial infarction ([place])    "EKG shows I've had 2; date unknown" (09/19/2016)  . OA (osteoarthritis)    "a little here and there; mainly in the back" (09/19/2016)  . Osteoporosis 05/18/2011  . PAT (paroxysmal atrial tachycardia) ([place])   . Pectus excavatum 05/18/2011  . Personal history of colonic polyps 10/28/2012  . Pneumonia    "as a child"  . SVT (supraventricular tachycardia) ([place])   . Urinary frequency     Past Surgical History:  Procedure Laterality Date  . ABDOMINAL HYSTERECTOMY  1988  . BREAST BIOPSY Right 1948   "it was ok"  . CARDIAC CATHETERIZATION    . CARDIOVASCULAR STRESS TEST  03/08/2009   EF 84%  . CATARACT EXTRACTION W/ INTRAOCULAR LENS  IMPLANT, BILATERAL Bilateral   . INGUINAL HERNIA REPAIR Right 06/23/2017   Procedure: REPAIR INCARCERATED  RIGHT FEMORAL HERNIA WITH MESH;  Surgeon: Erroll Luna, MD;  Location: [facility];  Service: General;  Laterality: Right;  . KNEE ARTHROSCOPY Right   . RIGHT/LEFT HEART CATH AND CORONARY ANGIOGRAPHY N/A 07/25/2016   Procedure: Right/Left Heart Cath and Coronary Angiography;  Surgeon: Burnell Blanks, MD;  Location: [facility] CV LAB;  Service: Cardiovascular;  Laterality: N/A;  . SVT ABLATION  09/19/2016  . SVT ABLATION N/A 09/19/2016   Procedure: SVT Ablation;  Surgeon: Thompson Grayer, MD;  Location: [facility] CV  LAB;  Service: Cardiovascular;  Laterality: N/A;  . TONSILLECTOMY AND ADENOIDECTOMY    . US ECHOCARDIOGRAPHY  01/17/2003   EF 55-60%    Current Outpatient Medications  Medication Sig Dispense Refill  . acetaminophen (TYLENOL) 500 MG tablet Take 1,000 mg by mouth 2 (two) times daily as needed for moderate pain or headache.    . ALPRAZolam (XANAX) 0.25 MG tablet TAKE 1 TABLET BY MOUTH AT BEDTIME AS NEEDED FOR ANXIETY 90 tablet 1  . amLODipine (NORVASC) 5 MG tablet TAKE ONE TABLET BY MOUTH DAILY 90 tablet 3  . Ascorbic Acid (VITAMIN C PO) Take 1  tablet by mouth daily. Reported on 08/08/2015    . aspirin 81 MG tablet Take 81 mg by mouth at bedtime.     . B Complex Vitamins (VITAMIN B COMPLEX PO) Take 1 tablet by mouth daily.     . budesonide-formoterol (SYMBICORT) 80-4.5 MCG/ACT inhaler Symbicort 80 mcg-4.5 mcg/actuation HFA aerosol inhaler    . calcium carbonate (OS-CAL) 600 MG tablet Take 600 mg by mouth daily.    Marland Kitchen. diltiazem (CARDIZEM) 30 MG tablet Take 1 tablet (30 mg total) by mouth 4 (four) times daily as needed. 120 tablet 11  . diltiazem (CARTIA XT) 120 MG 24 hr capsule Take 1 capsule (120 mg total) by mouth daily. 90 capsule 2  . docusate sodium (COLACE) 100 MG capsule Take 100 mg by mouth daily.     . furosemide (LASIX) 40 MG tablet TAKE ONE TABLET BY MOUTH DAILY *PLEASE KEEP UPCOMING NOVEMBER APPOINTMENT FOR FUTURE REFILLS* 90 tablet 3  . gabapentin (NEURONTIN) 100 MG capsule Take 1 capsule (100 mg total) by mouth 3 (three) times daily. 90 capsule 11  . Glucosamine HCl-MSM (GLUCOSAMINE-MSM PO) Take 2 tablets by mouth daily.     . nitroGLYCERIN (NITROSTAT) 0.4 MG SL tablet Place 1 tablet (0.4 mg total) under the tongue every 5 (five) minutes as needed for chest pain. 25 tablet 3  . omeprazole (PRILOSEC) 40 MG capsule TAKE ONE CAPSULE BY MOUTH DAILY 90 capsule 1  . PRESCRIPTION MEDICATION Apply 1 Dose to eye every 3 (three) months. Eylea - eye injection    . simvastatin (ZOCOR) 10 MG tablet Take 1 tablet (10 mg total) by mouth daily. 90 tablet 2  . tobramycin (TOBREX) 0.3 % ophthalmic solution Place 1 drop into the right eye 4 (four) times daily. Use the day before, the day of, and the day after eye injections    . traMADol (ULTRAM) 50 MG tablet Take 1 tablet (50 mg total) by mouth 2 (two) times daily as needed. 60 tablet 2  . triamcinolone cream (KENALOG) 0.1 % Apply 1 application topically daily as needed (hives). Apply to affected area 30 g 0  . vitamin E 200 UNIT capsule Take 200 Units by mouth daily.    . budesonide-formoterol  (SYMBICORT) 80-4.5 MCG/ACT inhaler INHALE 1 PUFF INTO THE LUNGS TWICE A DAY AS DIRECTED 1 Inhaler 5   No current facility-administered medications for this visit.     Allergies:   Patient has no active allergies.   Social History:  The patient  reports that she quit smoking about 24 years ago. Her smoking use included cigarettes. She started smoking about 59 years ago. She has a 54.00 pack-year smoking history. She has never used smokeless tobacco. She reports that she does not drink alcohol or use drugs.   Family History:  The patient's  family history includes Arthritis in her mother; Heart disease in her father;  Heart failure in her mother; Hypertension in her father; Kidney failure in her father.   ROS:  Please see the history of present illness.   All other systems are personally reviewed and negative.    Exam:    Vital Signs:  BP 128/70   Pulse 84   Ht 5\' 3"  (1.6 m)   Wt 135 lb (61.2 kg)   LMP  (LMP Unknown)   BMI 23.91 kg/m   Well sounding today   Labs/Other Tests and Data Reviewed:    Recent Labs: 06/17/2018: ALT 15; BUN 14; Creatinine, Ser 0.74; Hemoglobin 15.1; Platelets 423.0; Potassium 4.2; Sodium 134; TSH 1.39   Wt Readings from Last 3 Encounters:  11/23/18 135 lb (61.2 kg)  11/09/18 135 lb (61.2 kg)  08/12/18 137 lb 9.6 oz (62.4 kg)     ASSESSMENT & PLAN:    1.  SVT Well controlled post ablation She is pleased with current health state  2. CAD No ischemic symptoms  3. Costochondritis Stable No change required today  4. HTN Stable No change required today  5. SOB Chronic Followed by pulmonary  Follow-up:  12 months   Patient Risk:  after full review of this patients clinical status, I feel that they are at moderate risk at this time.  Today, I have spent 15 minutes with the patient with telehealth technology discussing arrhythmia management .    Randolm IdolSigned, Damauri Minion, MD  11/23/2018 11:10 AM     [facility] [address]  [address] [place] [address] [phone] (office) [phone] (fax)

## 2018-11-30 ENCOUNTER — Ambulatory Visit: Payer: Medicare Other | Admitting: Acute Care

## 2018-12-02 ENCOUNTER — Ambulatory Visit (INDEPENDENT_AMBULATORY_CARE_PROVIDER_SITE_OTHER): Payer: Medicare Other | Admitting: Acute Care

## 2018-12-02 ENCOUNTER — Encounter: Payer: Self-pay | Admitting: Acute Care

## 2018-12-02 ENCOUNTER — Other Ambulatory Visit: Payer: Self-pay

## 2018-12-02 VITALS — BP 134/68 | HR 71 | Temp 98.0°F | Ht 63.0 in | Wt 134.4 lb

## 2018-12-02 DIAGNOSIS — I2510 Atherosclerotic heart disease of native coronary artery without angina pectoris: Secondary | ICD-10-CM | POA: Diagnosis not present

## 2018-12-02 DIAGNOSIS — J479 Bronchiectasis, uncomplicated: Secondary | ICD-10-CM

## 2018-12-02 DIAGNOSIS — J471 Bronchiectasis with (acute) exacerbation: Secondary | ICD-10-CM

## 2018-12-02 DIAGNOSIS — J449 Chronic obstructive pulmonary disease, unspecified: Secondary | ICD-10-CM

## 2018-12-02 DIAGNOSIS — R0600 Dyspnea, unspecified: Secondary | ICD-10-CM | POA: Diagnosis not present

## 2018-12-02 MED ORDER — FLUTTER DEVI: 0 refills | Status: DC

## 2018-12-02 NOTE — Patient Instructions (Addendum)
It is good to see you today. Continue Symbicort 2 puff twice  daily every day>> wait 20 minutes between each puff Rinse mouth after use Return to 1 puff twice daily if dizziness is still an issue. Call the office and let us know. Continue Saline drops for sinus irrigation as needed. Continue using your zyrtec daily  Mucinex 600 mg once daily with full glass of water Flutter Valve 4 puffs twice daily for mucus clearance We will have the nurses instruct you on use Please wash the flutter valve once weekly with dawn and water, and allow to dry thoroughly prior to use We will order a  CT chest without contrast at follow up to evaluate  We will order a 6 minute walk Someone will call you to schedule Consider pulmonary rehab once they re-open ( Closed 2/2 COVID) Follow up in 2  months with Dr. Lake Bells or Judson Roch NP Please contact office for sooner follow up if symptoms do not improve or worsen or seek emergency care

## 2018-12-02 NOTE — Addendum Note (Signed)
Addended by: Jannette Spanner on: 12/02/2018 03:52 PM   Modules accepted: Orders

## 2018-12-02 NOTE — Progress Notes (Signed)
History of Present Illness Alexandra Reyes is a 85 y.o. female with  COPD, Allergic rhinitis, ILD, Right sided intercostal neuralgiaand GERD.She is followed by Dr. Lake Bells.   12/02/2018 Follow up OV: Pt. Presents for follow up . She was last seen for a tele visit 09/14/2018 for worsening dyspnea. She states she is more short of breath after walking and talking. She states she has been doing well until the last month. She has found she is more short of breath. Her oxygen level was 100% after ambulating into the room. She states she is compliant with her Mucinex . We are going to add a flutter valve today. She is compliant with her Symbicort 1 puff in the morning and in the evening.She is using her Zyrtec and her saline rinses. She states she finds no relief with Nasocort. She states she is using Afrin at bedtime instead. We discussed rebound stuffiness with over use of Afrin. She is using face coverings and she is social distancing.She denies any fever, chest pain, orthopnea or hemoptysis. She is compliant with her compression stockings.She does yard work and she is very active for her age. She denies any long car rides, or travel. No leg pain or swelling.    Pulmonary Embolism Wells Score Select Criteria:  Symptoms of DVT (3 points)  No alternative diagnosis better explains the illness (3 points)  Tachycardia with pulse > 100 (1.5 points)  Immobilization (>= 3 days) or surgery in the previous four weeks (1.5 points)  Prior history of DVT or pulmonary embolism (1.5 points)  Presence of hemoptysis (1 point)  Presence of malignancy (1 point) Results: Total Criteria Point Count:   0  Low probability of Thrombo embolic   Pulmonary Embolism Risk Score Interpretation Score > 6: High probability Score >= 2 and <= 6: Moderate probability Score < 2: Low Probability  Test Results: PFT 08/26/2018 FVC 2.27 ( 112%), FEV1 1.70 ( 114%), F/F Ratio 75 ( 104%), FEF 25-75 0.75L 153%), no significant BD  response, TLC 4.86 ( 99%), Uncorrected DLCO 71% >> Pt. Used Symbicort  at 7 am , PFT's were done at 11 am. 07/12/15: FVC 2.29 L (106%) FEV1 1.57 L (99%) FEV1/FVC 0.68 FEF 25-75 0.78 L (81%) no bronchodilator response TLC 3.59 L (73%) RV 52% ERV 342% DLCO uncorrected 52% 05/14/13: FVC 2.34 L (103%) FEV1 1.63 L (97%) FEV1/FVC 0.69 FEF 25-75 0.83 L (76%) no bronchodilator response TLC 4.51 L (92%) ERV 284% DLCO uncorrected 55%  IMAGING CT CHEST W/O 04/03/16:Images independently reviewed showing some intralobular septal thickening and intralobular nodules in the bases predominantly, no other reticular change, no honeycombing  CT CHEST W/O 10/09/15 : No pneumothorax or effusion. Minimal posterior subsegmental atelectasis. No appreciable bronchiectasis, mass, or nodule. No pathologic mediastinal adenopathy. No pericardial effusion. Mild coronary artery calcification.  BARIUM SWALLOW (08/11/15):No evidence for stricture or mass lesion. Moderate esophageal dysmotility without hiatal hernia. Small amount spontaneous reflux into the lower third of the esophagus.  CTA CHEST 08/03/15: Mild lower lobe predominant atelectasis. No other nodule or opacity appreciated. No ulnar emboli. No pleural effusion or thickening. No pericardial effusion. No pathologic mediastinal adenopathy.  CARDIAC LHC (07/25/16):  Prox RCA lesion, 100 %stenosed.  Prox LAD lesion, 20 %stenosed.  LV end diastolic pressure is normal.  1. 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 The mid and distal vessel fills briskly from left to right collaterals.  2. Mild non-obstructive disease in the LAD 3. Normal filling pressures.  4.  Normal  PA pressures  TTE (07/07/15): Narrow LVOT with normal cavity size. Mild LVH. EF 55-60%. Grade 1 diastolic dysfunction. LA &RA normal in size. RV normal in size and function. No aortic regurgitation. No mitral stenosis or regurgitation. No pulmonic regurgitation. Mild tricuspid regurgitation. No  pericardial effusion.  LABS 04/17/16 CRP: 0.2 ESR: 19 ANA: Negative Anti-CCP: <16 RF: <14 SCL-70: <1.0 Hypersensitivity Pneumonitis Panel: Negative   08/02/15 CBC: 9.2/14.4/42.7/390 BMP: 133/4.3/98/28/17/1.09/94/9.5 LFT: 4.2/7.0/0.5/62/18/16   CBC Latest Ref Rng & Units 06/17/2018 12/15/2017 06/25/2017  WBC 4.0 - 10.5 K/uL 8.7 7.8 14.6(H)  Hemoglobin 12.0 - 15.0 g/dL 15.1(H) 14.7 14.3  Hematocrit 36.0 - 46.0 % 45.1 43.7 42.1  Platelets 150.0 - 400.0 K/uL 423.0(H) 407.0(H) 337    BMP Latest Ref Rng & Units 06/17/2018 12/15/2017 06/25/2017  Glucose 70 - 99 mg/dL 86 75 106(H)  BUN 6 - 23 mg/dL _0 Creatinine 0.40 - 1.20 mg/dL 0.74 0.69 0.98  BUN/Creat Ratio 12 - 28 - - -  Sodium 135 - 145 mEq/L 134(L) 133(L) 135  Potassium 3.5 - 5.1 mEq/L 4.2 4.6 3.5  Chloride 96 - 112 mEq/L 99 98 103  CO2 19 - 32 mEq/L _1 Calcium 8.4 - 10.5 mg/dL 9.2 8.9 8.3(L)    BNP No results found for: BNP  ProBNP    Component Value Date/Time   PROBNP 104.0 (H) 02/07/2011 1125    PFT    Component Value Date/Time   FEV1PRE 1.70 08/26/2018 1051   FEV1POST 1.74 08/26/2018 1051   FVCPRE 2.27 08/26/2018 1051   FVCPOST 2.29 08/26/2018 1051   TLC 4.86 08/26/2018 1051   DLCOUNC 12.50 08/26/2018 1051   PREFEV1FVCRT 75 08/26/2018 1051   PSTFEV1FVCRT 76 08/26/2018 1051    No results found.   Past medical hx Past Medical History:  Diagnosis Date  . Age-related macular degeneration, wet, both eyes ([place])   . Alcohol dependence in remission ([place]) 05/22/2011  . Allergic rhinitis, cause unspecified 05/22/2011  . Allergy   . Anemia, iron deficiency 05/18/2011  . Cholelithiasis   . COPD (chronic obstructive pulmonary disease) ([place])   . DDD (degenerative disc disease), lumbar 05/18/2011  . Depression    "@ times" (09/19/2016)  . First degree AV block   . GERD (gastroesophageal reflux disease)   . Hiatal hernia   . History of blood transfusion    "3 or 4 related to childbirth"  .  HTN (hypertension) 05/18/2011   pt denies this hx on 09/19/2016  . Hyperlipidemia 05/18/2011  . Macular degeneration   . Myocardial infarction ([place])    "EKG shows I've had 2; date unknown" (09/19/2016)  . OA (osteoarthritis)    "a little here and there; mainly in the back" (09/19/2016)  . Osteoporosis 05/18/2011  . PAT (paroxysmal atrial tachycardia) ([place])   . Pectus excavatum 05/18/2011  . Personal history of colonic polyps 10/28/2012  . Pneumonia    "as a child"  . SVT (supraventricular tachycardia) ([place])   . Urinary frequency      Social History   Tobacco Use  . Smoking status: Former Smoker    Packs/day: 1.50    Years: 36.00    Pack years: 54.00    Types: Cigarettes    Start date: 03/01/1959    Quit date: 06/10/1994    Years since quitting: 24.4  . Smokeless tobacco: Never Used  Substance Use Topics  . Alcohol use: No    Frequency: Never  . Drug use: No  Ms.Rusnak reports that she quit smoking about 24 years ago. Her smoking use included cigarettes. She started smoking about 59 years ago. She has a 54.00 pack-year smoking history. She has never used smokeless tobacco. She reports that she does not drink alcohol or use drugs.   Tobacco Cessation: Former smoker , quit 1996, 54 pack years  Past surgical hx, Family hx, Social hx all reviewed.  Current Outpatient Medications on File Prior to Visit  Medication Sig  . acetaminophen (TYLENOL) 500 MG tablet Take 1,000 mg by mouth 2 (two) times daily as needed for moderate pain or headache.  . ALPRAZolam (XANAX) 0.25 MG tablet TAKE 1 TABLET BY MOUTH AT BEDTIME AS NEEDED FOR ANXIETY  . amLODipine (NORVASC) 5 MG tablet TAKE ONE TABLET BY MOUTH DAILY  . Ascorbic Acid (VITAMIN C PO) Take 1 tablet by mouth daily. Reported on 08/08/2015  . aspirin 81 MG tablet Take 81 mg by mouth at bedtime.   . B Complex Vitamins (VITAMIN B COMPLEX PO) Take 1 tablet by mouth daily.   . budesonide-formoterol (SYMBICORT) 80-4.5 MCG/ACT inhaler Symbicort  80 mcg-4.5 mcg/actuation HFA aerosol inhaler  . calcium carbonate (OS-CAL) 600 MG tablet Take 600 mg by mouth daily.  Marland Kitchen diltiazem (CARDIZEM) 30 MG tablet Take 1 tablet (30 mg total) by mouth 4 (four) times daily as needed.  . diltiazem (CARTIA XT) 120 MG 24 hr capsule Take 1 capsule (120 mg total) by mouth daily.  Marland Kitchen docusate sodium (COLACE) 100 MG capsule Take 100 mg by mouth daily.   . furosemide (LASIX) 40 MG tablet TAKE ONE TABLET BY MOUTH DAILY *PLEASE KEEP UPCOMING NOVEMBER APPOINTMENT FOR FUTURE REFILLS*  . gabapentin (NEURONTIN) 100 MG capsule Take 1 capsule (100 mg total) by mouth 3 (three) times daily.  . Glucosamine HCl-MSM (GLUCOSAMINE-MSM PO) Take 2 tablets by mouth daily.   . nitroGLYCERIN (NITROSTAT) 0.4 MG SL tablet Place 1 tablet (0.4 mg total) under the tongue every 5 (five) minutes as needed for chest pain.  Marland Kitchen omeprazole (PRILOSEC) 40 MG capsule TAKE ONE CAPSULE BY MOUTH DAILY  . PRESCRIPTION MEDICATION Apply 1 Dose to eye every 3 (three) months. Eylea - eye injection  . simvastatin (ZOCOR) 10 MG tablet Take 1 tablet (10 mg total) by mouth daily.  Marland Kitchen tobramycin (TOBREX) 0.3 % ophthalmic solution Place 1 drop into the right eye 4 (four) times daily. Use the day before, the day of, and the day after eye injections  . traMADol (ULTRAM) 50 MG tablet Take 1 tablet (50 mg total) by mouth 2 (two) times daily as needed.  . triamcinolone cream (KENALOG) 0.1 % Apply 1 application topically daily as needed (hives). Apply to affected area  . vitamin E 200 UNIT capsule Take 200 Units by mouth daily.  . budesonide-formoterol (SYMBICORT) 80-4.5 MCG/ACT inhaler INHALE 1 PUFF INTO THE LUNGS TWICE A DAY AS DIRECTED   No current facility-administered medications on file prior to visit.      No Active Allergies  Review Of Systems:  Constitutional:   No  weight loss, night sweats,  Fevers, chills, + fatigue, or  lassitude.  HEENT:   No headaches,  Difficulty swallowing,  Tooth/dental problems,  or  Sore throat,                No sneezing, itching, ear ache, + nasal congestion, post nasal drip,   CV:  No chest pain,  Orthopnea, PND, + swelling in lower extremities if stockings are off, anasarca, dizziness, palpitations, syncope.  GI  No heartburn, indigestion, abdominal pain, nausea, vomiting, diarrhea, change in bowel habits,+  loss of appetite, bloody stools.   Resp: No shortness of breath with exertion or at rest.  No excess mucus, no productive cough,  No non-productive cough,  No coughing up of blood.  No change in color of mucus.  No wheezing.  No chest wall deformity  Skin: no rash or lesions.  GU: no dysuria, change in color of urine, no urgency or frequency.  No flank pain, no hematuria   MS:  No joint pain or swelling.  No decreased range of motion.  No back pain.  Psych:  No change in mood or affect. No depression or anxiety.  No memory loss.   Vital Signs BP 134/68 (BP Location: Right Arm, Cuff Size: Normal)   Pulse 71   Temp 98 F (36.7 C) (Oral)   Ht _0  (1.6 m)   Wt 134 lb 6.4 oz (61 kg)   LMP  (LMP Unknown)   SpO2 97%   BMI 23.81 kg/m    Physical Exam:  General- No distress,  A&Ox3, Appropriate ENT: No sinus tenderness, TM clear, pale nasal mucosa, no oral exudate,no post nasal drip, no LAN Cardiac: S1, S2, regular rate and rhythm, no murmur Chest: No wheeze/ rales/ dullness; no accessory muscle use, no nasal flaring, no sternal retractions Abd.: Soft Non-tender, ND, BS + Ext: No clubbing cyanosis, edema Neuro:  normal strength Skin: No rashes, warm and dry Psych: normal mood and behavior   Assessment/Plan  Assessment and Plan: ILD (interstitial lung disease) (HCC) Dyspnea with exertion that she feels is progressively worse Last PFT 08/2018 with Minimal Obstructive Airways Disease Mild response to bronchodilator Minimal Restriction -Parenchymal Moderately  Diffusion Defect ( Improved over last PFT) Plan: Continue Symbicort 1 puff once  daily every day>> consider increasing to 2 puffs twice daily or increasing to 160 mg dosing Rinse mouth after use Continue Zyrtec daily as you have been doing. Continue Saline drops for sinus irrigation as needed. Continue Mucinex with a full glass of water for clearance of mucus  We will order a CT chest without contrast  We will order a 6 minute walk Someone will call you to schedule Consider pulmonary rehab once they re-open ( Closed 2/2 COVID) Follow up in 2 months with Dr. Lake Bells or Judson Roch NP  COPD (chronic obstructive pulmonary disease) Compliant with Symbicort 1 puff twice daily Plan: Increase  Symbicort 80 mg 2 puffs twice daily>>  Rinse mouth after use  Allergic rhinitis, cause unspecified Continued issues which she is managing with maintenance Plan: Continue using your zyrtec daily  Continue using nasal saline as you have been doing Add saline rinses for flares   Bronchiectasis without acute exacerbation (HCC) Continued secretions that are white Plan: Mucinex 600 mg once daily with full glass of water Flutter Valve 4 puffs twice daily for mucus clearance We will have the nurses instruct you on use Please wash the flutter valve once weekly with dawn and water, and allow to dry thoroughly prior to use   Magdalen Spatz, NP 12/02/2018  3:16 PM

## 2018-12-05 NOTE — Progress Notes (Signed)
Reviewed, agree 

## 2018-12-07 ENCOUNTER — Ambulatory Visit: Payer: Medicare Other | Admitting: Acute Care

## 2018-12-09 ENCOUNTER — Ambulatory Visit (INDEPENDENT_AMBULATORY_CARE_PROVIDER_SITE_OTHER): Payer: Medicare Other

## 2018-12-09 ENCOUNTER — Other Ambulatory Visit: Payer: Self-pay

## 2018-12-09 DIAGNOSIS — J449 Chronic obstructive pulmonary disease, unspecified: Secondary | ICD-10-CM

## 2018-12-09 NOTE — Progress Notes (Signed)
SIX MIN WALK 12/09/2018  Medications 9-10 AM medications: acetaminophen 500mg , amlodipine besylate 5mg , ascorbic acid, aspirin 81mg , B complex vitamins, Symbicort 80, calcium 600mg , diltiazem 120mg , furosemide 40mg , gabapentin 100mg , glucosamine 2tablets, omeprazole 40mg , flutter valve once  Supplimental Oxygen during Test? (L/min) No  Laps 12  Partial Lap (in Meters) 12  Baseline BP (sitting) 130/60  Baseline Heartrate 72  Baseline Dyspnea (Borg Scale) 3  Baseline Fatigue (Borg Scale) 2  Baseline SPO2 97  BP (sitting) 128/60  Heartrate 76  Dyspnea (Borg Scale) 4  Fatigue (Borg Scale) 2  SPO2 98  BP (sitting) 120/60  Heartrate 50  SPO2 97  Stopped or Paused before Six Minutes Yes  Other Symptoms at end of Exercise Pt paused twice during walk d/t L leg pain  Interpretation Leg pain  Distance Completed 420  Tech Comments: Pt completed walk at a slow-moderate pace. She began walk at a moderate pace and began to slow down by the end of the walk. She paused twice during walk d/t what she reported as leg pain. Denied dizziness, lightheadedness, or increased SOB. Pt tolerated walk well.

## 2018-12-16 ENCOUNTER — Ambulatory Visit (INDEPENDENT_AMBULATORY_CARE_PROVIDER_SITE_OTHER): Payer: Medicare Other | Admitting: Internal Medicine

## 2018-12-16 ENCOUNTER — Other Ambulatory Visit: Payer: Self-pay

## 2018-12-16 ENCOUNTER — Encounter: Payer: Self-pay | Admitting: Internal Medicine

## 2018-12-16 VITALS — BP 112/78 | HR 75 | Temp 97.7°F | Ht 63.0 in | Wt 134.0 lb

## 2018-12-16 DIAGNOSIS — R739 Hyperglycemia, unspecified: Secondary | ICD-10-CM | POA: Diagnosis not present

## 2018-12-16 DIAGNOSIS — I10 Essential (primary) hypertension: Secondary | ICD-10-CM

## 2018-12-16 DIAGNOSIS — I2510 Atherosclerotic heart disease of native coronary artery without angina pectoris: Secondary | ICD-10-CM | POA: Diagnosis not present

## 2018-12-16 DIAGNOSIS — E785 Hyperlipidemia, unspecified: Secondary | ICD-10-CM | POA: Diagnosis not present

## 2018-12-16 MED ORDER — TRAMADOL HCL 50 MG PO TABS: 50.0000 mg | ORAL_TABLET | Freq: Two times a day (BID) | ORAL | 2 refills | Status: DC | PRN

## 2018-12-16 MED ORDER — TRIAMCINOLONE ACETONIDE 0.1 % EX CREA: 1.0000 "application " | TOPICAL_CREAM | Freq: Every day | CUTANEOUS | 0 refills | Status: DC | PRN

## 2018-12-16 MED ORDER — BUDESONIDE-FORMOTEROL FUMARATE 80-4.5 MCG/ACT IN AERO: INHALATION_SPRAY | RESPIRATORY_TRACT | 5 refills | Status: DC

## 2018-12-16 NOTE — Patient Instructions (Signed)
Please continue all other medications as before, and refills have been done if requested.  Please have the pharmacy call with any other refills you may need.  Please continue your efforts at being more active, low cholesterol diet, and weight control.  Please keep your appointments with your specialists as you may have planned  Please return in 6 months, or sooner if needed 

## 2018-12-16 NOTE — Assessment & Plan Note (Signed)
stable overall by history and exam, recent data reviewed with pt, and pt to continue medical treatment as before,  to f/u any worsening symptoms or concerns  

## 2018-12-16 NOTE — Progress Notes (Signed)
Subjective:    Patient ID: Alexandra Reyes, female    DOB: [birth year], 85 y.o.   MRN: [id number]  HPI  Here to f/u; overall doing ok,  Pt denies chest pain, increasing sob or doe, wheezing, orthopnea, PND, increased LE swelling, palpitations, dizziness or syncope.  Pt denies new neurological symptoms such as new headache, or facial or extremity weakness or numbness.  Pt denies polydipsia, polyuria, or low sugar episode.  Pt states overall good compliance with meds, mostly trying to follow appropriate diet, with wt overall stable,  but little exercise however.   Right foot middle cock up toe irritated recently, tx with triam and improved Past Medical History:  Diagnosis Date  . Age-related macular degeneration, wet, both eyes (HCC)   . Alcohol dependence in remission (HCC) 05/22/2011  . Allergic rhinitis, cause unspecified 05/22/2011  . Allergy   . Anemia, iron deficiency 05/18/2011  . Cholelithiasis   . COPD (chronic obstructive pulmonary disease) (H[facility])   . DDD (degenerative disc disease), lumbar 05/18/2011  . Depression    "@ times" (09/19/2016)  . First degree AV block   . GERD (gastroesophageal reflux disease)   . Hiatal hernia   . History of blood transfusion    "3 or 4 related to childbirth"  . HTN (hypertension) 05/18/2011   pt denies this hx on 09/19/2016  . Hyperlipidemia 05/18/2011  . Macular degeneration   . Myocardial infarction (HCC)    "EKG shows I've had 2; date unknown" (09/19/2016)  . OA (osteoarthritis)    "a little here and there; mainly in the back" (09/19/2016)  . Osteoporosis 05/18/2011  . PAT (paroxysmal atrial tachycardia) (HCC)   . Pectus excavatum 05/18/2011  . Personal history of colonic polyps 10/28/2012  . Pneumonia    "as a child"  . SVT (supraventricular tachycardia) (HCC)   . Urinary frequency    Past Surgical History:  Procedure Laterality Date  . ABDOMINAL HYSTERECTOMY  1988  . BREAST BIOPSY Right 1948   "it was ok"  . CARDIAC CATHETERIZATION    .  CARDIOVASCULAR STRESS TEST  03/08/2009   EF 84%  . CATARACT EXTRACTION W/ INTRAOCULAR LENS  IMPLANT, BILATERAL Bilateral   . INGUINAL HERNIA REPAIR Right 06/23/2017   Procedure: REPAIR INCARCERATED  RIGHT FEMORAL HERNIA WITH MESH;  Surgeon: Harriette Bouillonornett, Thomas, MD;  Location: [facility] OR;  Service: General;  Laterality: Right;  . KNEE ARTHROSCOPY Right   . RIGHT/LEFT HEART CATH AND CORONARY ANGIOGRAPHY N/A 07/25/2016   Procedure: Right/Left Heart Cath and Coronary Angiography;  Surgeon: Kathleene Hazelhristopher D McAlhany, MD;  [facility];  Service: Cardiovascular;  Laterality: N/A;  . SVT ABLATION  09/19/2016  . SVT ABLATION N/A 09/19/2016   Procedure: SVT Ablation;  Surgeon: Hillis RangeJames Allred, MD;  [facility];  Service: Cardiovascular;  Laterality: N/A;  . TONSILLECTOMY AND ADENOIDECTOMY    . US ECHOCARDIOGRAPHY  01/17/2003   EF 55-60%    reports that she quit smoking about 24 years ago. Her smoking use included cigarettes. She started smoking about 59 years ago. She has a 54.00 pack-year smoking history. She has never used smokeless tobacco. She reports that she does not drink alcohol or use drugs. family history includes Arthritis in her mother; Heart disease in her father; Heart failure in her mother; Hypertension in her father; Kidney failure in her father. No Active Allergies Current Outpatient Medications on File Prior to Visit  Medication Sig Dispense Refill  . acetaminophen (TYLENOL) 500 MG tablet  Take 1,000 mg by mouth 2 (two) times daily as needed for moderate pain or headache.    . ALPRAZolam (XANAX) 0.25 MG tablet TAKE 1 TABLET BY MOUTH AT BEDTIME AS NEEDED FOR ANXIETY 90 tablet 1  . amLODipine (NORVASC) 5 MG tablet TAKE ONE TABLET BY MOUTH DAILY 90 tablet 3  . Ascorbic Acid (VITAMIN C PO) Take 1 tablet by mouth daily. Reported on 08/08/2015    . aspirin 81 MG tablet Take 81 mg by mouth at bedtime.     . B Complex Vitamins (VITAMIN B COMPLEX PO) Take 1 tablet by mouth daily.      . calcium carbonate (OS-CAL) 600 MG tablet Take 600 mg by mouth daily.    Marland Kitchen diltiazem (CARDIZEM) 30 MG tablet Take 1 tablet (30 mg total) by mouth 4 (four) times daily as needed. 120 tablet 11  . diltiazem (CARTIA XT) 120 MG 24 hr capsule Take 1 capsule (120 mg total) by mouth daily. 90 capsule 2  . docusate sodium (COLACE) 100 MG capsule Take 100 mg by mouth daily.     . furosemide (LASIX) 40 MG tablet TAKE ONE TABLET BY MOUTH DAILY *PLEASE KEEP UPCOMING NOVEMBER APPOINTMENT FOR FUTURE REFILLS* 90 tablet 3  . gabapentin (NEURONTIN) 100 MG capsule Take 1 capsule (100 mg total) by mouth 3 (three) times daily. 90 capsule 11  . Glucosamine HCl-MSM (GLUCOSAMINE-MSM PO) Take 2 tablets by mouth daily.     . nitroGLYCERIN (NITROSTAT) 0.4 MG SL tablet Place 1 tablet (0.4 mg total) under the tongue every 5 (five) minutes as needed for chest pain. 25 tablet 3  . omeprazole (PRILOSEC) 40 MG capsule TAKE ONE CAPSULE BY MOUTH DAILY 90 capsule 1  . PRESCRIPTION MEDICATION Apply 1 Dose to eye every 3 (three) months. Eylea - eye injection    . Respiratory Therapy Supplies (FLUTTER) DEVI Use as directed 1 each 0  . simvastatin (ZOCOR) 10 MG tablet Take 1 tablet (10 mg total) by mouth daily. 90 tablet 2  . tobramycin (TOBREX) 0.3 % ophthalmic solution Place 1 drop into the right eye 4 (four) times daily. Use the day before, the day of, and the day after eye injections    . vitamin E 200 UNIT capsule Take 200 Units by mouth daily.     No current facility-administered medications on file prior to visit.    Review of Systems  Constitutional: Negative for other unusual diaphoresis or sweats HENT: Negative for ear discharge or swelling Eyes: Negative for other worsening visual disturbances Respiratory: Negative for stridor or other swelling  Gastrointestinal: Negative for worsening distension or other blood Genitourinary: Negative for retention or other urinary change Musculoskeletal: Negative for other MSK pain  or swelling Skin: Negative for color change or other new lesions Neurological: Negative for worsening tremors and other numbness  Psychiatric/Behavioral: Negative for worsening agitation or other fatigue All other system neg per pt    Objective:   Physical Exam BP 112/78   Pulse 75   Temp 97.7 F (36.5 C) (Oral)   Ht 5\' 3"  (1.6 m)   Wt 134 lb (60.8 kg)   LMP  (LMP Unknown)   SpO2 98%   BMI 23.74 kg/m  Walks with cane VS noted,  Constitutional: Pt appears in NAD HENT: Head: NCAT.  Right Ear: External ear normal.  Left Ear: External ear normal.  Eyes: . Pupils are equal, round, and reactive to light. Conjunctivae and EOM are normal Nose: without d/c or deformity Neck: Neck supple.  Gross normal ROM Cardiovascular: Normal rate and regular rhythm.   Pulmonary/Chest: Effort normal and breath sounds without rales or wheezing.  Abd:  Soft, NT, ND, + BS, no organomegaly Neurological: Pt is alert. At baseline orientation, motor grossly intact Skin: Skin is warm. No rashes, other new lesions, no LE edema Psychiatric: Pt behavior is normal without agitation  No other exam findings  Lab Results  Component Value Date   WBC 8.7 06/17/2018   HGB 15.1 (H) 06/17/2018   HCT 45.1 06/17/2018   PLT 423.0 (H) 06/17/2018   GLUCOSE 86 06/17/2018   CHOL 174 06/17/2018   TRIG 105.0 06/17/2018   HDL 73.30 06/17/2018   LDLDIRECT 129.2 05/15/2011   LDLCALC 80 06/17/2018   ALT 15 06/17/2018   AST 18 06/17/2018   NA 134 (L) 06/17/2018   K 4.2 06/17/2018   CL 99 06/17/2018   CREATININE 0.74 06/17/2018   BUN 14 06/17/2018   CO2 27 06/17/2018   TSH 1.39 06/17/2018   INR 1.0 07/17/2016   HGBA1C 5.9 06/17/2018        Assessment & Plan:

## 2018-12-16 NOTE — Assessment & Plan Note (Signed)
>>  ASSESSMENT AND PLAN FOR HTN (HYPERTENSION) WRITTEN ON 12/16/2018  9:06 PM BY Corwin Levins, MD  stable overall by history and exam, recent data reviewed with pt, and pt to continue medical treatment as before,  to f/u any worsening symptoms or concerns

## 2018-12-17 ENCOUNTER — Telehealth: Payer: Self-pay | Admitting: Acute Care

## 2018-12-17 NOTE — Telephone Encounter (Signed)
Called pt's pharmacy and spoke with [place]. Stated to [place] that at pt's last OV, it was said for pt to do symb 2 puffs bid and for pt to wait 19min between each puff. Alice verbalized understanding and stated that she would put the instructions for pt to wait 25min between each puff on the Rx.  Called and spoke with pt letting her know that I was able to speak to pharmacy in regards to her symbicort and they should be able to get Rx ready for her. Pt verbalized understanding.nothing further needed.

## 2018-12-17 NOTE — Telephone Encounter (Signed)
Instructions Return in about 2 months (around 02/01/2019), or if symptoms worsen or fail to improve. It is good to see you today. Continue Symbicort 2 puff twice  daily every day>> wait 20 minutes between each puff Rinse mouth after use Return to 1 puff twice daily if dizziness is still an issue. Call the office and let us know. Continue Saline drops for sinus irrigation as needed. Continue using your zyrtec daily  Mucinex 600 mg once daily with full glass of water Flutter Valve 4 puffs twice daily for mucus clearance We will have the nurses instruct you on use Please wash the flutter valve once weekly with dawn and water, and allow to dry thoroughly prior to use We will order a  CT chest without contrast at follow up to evaluate  We will order a 6 minute walk Someone will call you to schedule Consider pulmonary rehab once they re-open ( Closed 2/2 COVID) Follow up in 2  months with Dr. Lake Bells or Judson Roch NP Please contact office for sooner follow up if symptoms do not improve or worsen or seek emergency care      Attempted to call [facility] but they are currently closed for lunch and will not reopen until 2:30pm. Will hold encounter open so we can try again after 2:30.

## 2018-12-17 NOTE — Telephone Encounter (Signed)
Pt is calling for an update on med change. Cb is [phone].

## 2018-12-24 ENCOUNTER — Other Ambulatory Visit: Payer: Medicare Other

## 2018-12-25 ENCOUNTER — Telehealth: Payer: Self-pay | Admitting: *Deleted

## 2018-12-25 DIAGNOSIS — H35371 Puckering of macula, right eye: Secondary | ICD-10-CM | POA: Diagnosis not present

## 2018-12-25 DIAGNOSIS — H353232 Exudative age-related macular degeneration, bilateral, with inactive choroidal neovascularization: Secondary | ICD-10-CM | POA: Diagnosis not present

## 2018-12-25 DIAGNOSIS — H43392 Other vitreous opacities, left eye: Secondary | ICD-10-CM | POA: Diagnosis not present

## 2018-12-25 DIAGNOSIS — H43813 Vitreous degeneration, bilateral: Secondary | ICD-10-CM | POA: Diagnosis not present

## 2018-12-25 NOTE — Telephone Encounter (Signed)

## 2018-12-28 ENCOUNTER — Other Ambulatory Visit: Payer: Self-pay

## 2018-12-28 ENCOUNTER — Ambulatory Visit (INDEPENDENT_AMBULATORY_CARE_PROVIDER_SITE_OTHER)
Admission: RE | Admit: 2018-12-28 | Discharge: 2018-12-28 | Disposition: A | Discharge status: Home / Self Care | Payer: Medicare Other | Source: Ambulatory Visit | Attending: Acute Care | Admitting: Acute Care

## 2018-12-28 ENCOUNTER — Other Ambulatory Visit: Payer: Self-pay | Admitting: Internal Medicine

## 2018-12-28 DIAGNOSIS — R0602 Shortness of breath: Secondary | ICD-10-CM | POA: Diagnosis not present

## 2018-12-28 DIAGNOSIS — J471 Bronchiectasis with (acute) exacerbation: Secondary | ICD-10-CM | POA: Diagnosis not present

## 2018-12-29 ENCOUNTER — Telehealth: Payer: Self-pay

## 2018-12-29 MED ORDER — OMEPRAZOLE 40 MG PO CPDR: 40.0000 mg | DELAYED_RELEASE_CAPSULE | Freq: Every day | ORAL | 2 refills | Status: DC

## 2018-12-29 NOTE — Telephone Encounter (Signed)
Refilled Omeprazole 

## 2018-12-29 NOTE — Telephone Encounter (Signed)
-----   Message from Irene Shipper, MD sent at 12/29/2018  8:08 AM EDT ----- She is 89.  No office visit needed.  Okay to refill.  Thanks ----- Message ----- From: Audrea Muscat, CMA Sent: 12/28/2018   4:05 PM EDT To: Irene Shipper, MD  Received refill request for Omeprazole - patient last seen 01/24/2016.   Does she need an office visit first?

## 2018-12-30 ENCOUNTER — Other Ambulatory Visit: Payer: Self-pay | Admitting: Internal Medicine

## 2018-12-30 NOTE — Telephone Encounter (Signed)
Done erx 

## 2019-01-04 ENCOUNTER — Ambulatory Visit (INDEPENDENT_AMBULATORY_CARE_PROVIDER_SITE_OTHER): Payer: Medicare Other | Admitting: Acute Care

## 2019-01-04 ENCOUNTER — Encounter: Payer: Self-pay | Admitting: Acute Care

## 2019-01-04 ENCOUNTER — Other Ambulatory Visit: Payer: Medicare Other

## 2019-01-04 ENCOUNTER — Other Ambulatory Visit: Payer: Self-pay

## 2019-01-04 DIAGNOSIS — J471 Bronchiectasis with (acute) exacerbation: Secondary | ICD-10-CM

## 2019-01-04 NOTE — Progress Notes (Signed)
Virtual Visit via Telephone Note  I connected with Alexandra Reyes on 01/04/19 at  9:00 AM EDT by telephone and verified that I am speaking with the correct person using two identifiers.  Location: Patient: At home Provider: In the office at [address] [place], [place], [address]   I discussed the limitations, risks, security and privacy concerns of performing an evaluation and management service by telephone and the availability of in person appointments. I also discussed with the patient that there may be a patient responsible charge related to this service. The patient expressed understanding and agreed to proceed.  I  confirmed date of birth and address to authenticate patient  Identity. My nurse Quentin Ore reviewed medications and ordered any refills required.  History of Present Illness Alexandra Reyes is a 85 y.o. female with  COPD, Allergic rhinitis, ILD, Bronchiectasis, Right sided intercostal neuralgiaand GERD.She is followed by Dr. Lake Bells.  History of Present Illness: Pt. Had a CT Chest 12/28/2018. The results were positive for bronchiectasis and interstitial coarsening. There was notation that findings could be post infectious of post inflammatory. She endorses continued thick white to clear secretions. She states she is coughing these up each day.She does get fatigued. She has been experiencing her baseline shortness of breath. She is using gabapentin 100 mg three times a day for costa chondritis caused by her coughing. She states this helps. She is compliant with her Mucinex and flutter valve. She denies any fever, or change in color of secretions. Pt did a 6 minute  Walk recently and was able to walk 420 feet.She denies fever, orthopnea or hemoptysis. We discussed the possibility of using a chest vest, but she is unsure if  she wants to try that. She worries that the risk or discomfort will outweigh any benefit.    Observations/Objective: PFT 08/26/2018 FVC 2.27  ( 112%), FEV1 1.70 ( 114%), F/F Ratio 75 ( 104%), FEF 25-75 0.75L 153%), no significant BD response, TLC 4.86 ( 99%), Uncorrected DLCO 71% >> Pt. Used Symbicort  at 7 am , PFT's were done at 11 am. 07/12/15: FVC 2.29 L (106%) FEV1 1.57 L (99%) FEV1/FVC 0.68 FEF 25-75 0.78 L (81%) no bronchodilator response TLC 3.59 L (73%) RV 52% ERV 342% DLCO uncorrected 52% 05/14/13: FVC 2.34 L (103%) FEV1 1.63 L (97%) FEV1/FVC 0.69 FEF 25-75 0.83 L (76%) no bronchodilator response TLC 4.51 L (92%) ERV 284% DLCO uncorrected 55%  IMAGING CT CHEST W/O 04/03/16:Images independently reviewed showing some intralobular septal thickening and intralobular nodules in the bases predominantly, no other reticular change, no honeycombing  CT CHEST W/O 10/09/15 : No pneumothorax or effusion. Minimal posterior subsegmental atelectasis. No appreciable bronchiectasis, mass, or nodule. No pathologic mediastinal adenopathy. No pericardial effusion. Mild coronary artery calcification.  BARIUM SWALLOW (08/11/15):No evidence for stricture or mass lesion. Moderate esophageal dysmotility without hiatal hernia. Small amount spontaneous reflux into the lower third of the esophagus.  CTA CHEST 08/03/15: Mild lower lobe predominant atelectasis. No other nodule or opacity appreciated. No ulnar emboli. No pleural effusion or thickening. No pericardial effusion. No pathologic mediastinal adenopathy.  CARDIAC LHC (07/25/16):  Prox RCA lesion, 100 %stenosed.  Prox LAD lesion, 20 %stenosed.  LV end diastolic pressure is normal.  1. 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 The mid and distal vessel fills briskly from left to right collaterals.  2. Mild non-obstructive disease in the LAD 3. Normal filling pressures.  4.  Normal PA pressures  TTE (07/07/15): Narrow  LVOT with normal cavity size. Mild LVH. EF 55-60%. Grade 1 diastolic dysfunction. LA &RA normal in size. RV normal in size and function. No aortic regurgitation. No  mitral stenosis or regurgitation. No pulmonic regurgitation. Mild tricuspid regurgitation. No pericardial effusion.  LABS 04/17/16 CRP: 0.2 ESR: 19 ANA: Negative Anti-CCP: <16 RF: <14 SCL-70: <1.0 Hypersensitivity Pneumonitis Panel: Negative   08/02/15 CBC: 9.2/14.4/42.7/390 BMP: 133/4.3/98/28/17/1.09/94/9.5 LFT: 4.2/7.0/0.5/62/18/16   Assessment and Plan: Bilateral lower lobe bronchiectasis with interstitial coarsening. Findings may be postinfectious or postinflammatory Interstitial lung disease such as nonspecific interstitial pneumonitis could have this appearance Patient remains at baseline, no significant decline Plan Please go to the lab at [facility] and collect a specimen cup. We will send Sputum for culture, fungal, AFB We will call you with results Antimicrobial therapy if  indicated by culture results Continue Mucinex daily Continue flutter valve daily Follow-up HRCT in 3 to 6 months or as is clinically indicated Continue to trend 6-minute walk Consider chest vest as needed Follow up in 1 month Please contact office for sooner follow up if symptoms do not improve or worsen or seek emergency care   Follow Up Instructions: Follow up in 1 month, or after culture results are final   I discussed the assessment and treatment plan with the patient. The patient was provided an opportunity to ask questions and all were answered. The patient agreed with the plan and demonstrated an understanding of the instructions.   The patient was advised to call back or seek an in-person evaluation if the symptoms worsen or if the condition fails to improve as anticipated.  I provided 30 minutes of non-face-to-face time during this encounter.   Magdalen Spatz, NP 01/04/2019 7:41 PM

## 2019-01-04 NOTE — Patient Instructions (Signed)
It was good to talk with you today Please go to the lab at [facility] and collect a specimen cup. We will send Sputum for culture, fungal, AFB We will call you with results Antimicrobial therapy if  indicated by culture results Continue Mucinex daily Continue flutter valve daily Follow-up HRCT in 3 to 6 months or as is clinically indicated Continue to trend 6-minute walk Consider chest vest as needed Follow up in 1 month Please contact office for sooner follow up if symptoms do not improve or worsen or seek emergency care

## 2019-01-29 ENCOUNTER — Telehealth: Payer: Self-pay | Admitting: Internal Medicine

## 2019-01-29 DIAGNOSIS — R269 Unspecified abnormalities of gait and mobility: Secondary | ICD-10-CM

## 2019-01-29 NOTE — Telephone Encounter (Signed)
Notified pt [facility] has [facility] to [facility]Marland KitchenJohny Chess

## 2019-01-29 NOTE — Telephone Encounter (Signed)
Pt called and is requesting an alluminum adjustable offset cane with a tip to replace the one she had stolen. Pt is requesting a prescription be sent to [facility]. Pt states she does not have a fax #. Pt states she did speak with medicare to see if the cane was covered. Please advise.

## 2019-01-29 NOTE — Telephone Encounter (Signed)
Done hardcopy to lucy 

## 2019-02-08 DIAGNOSIS — N816 Rectocele: Secondary | ICD-10-CM | POA: Diagnosis not present

## 2019-02-08 DIAGNOSIS — N898 Other specified noninflammatory disorders of vagina: Secondary | ICD-10-CM | POA: Diagnosis not present

## 2019-02-24 ENCOUNTER — Encounter: Payer: Self-pay | Admitting: Acute Care

## 2019-02-24 ENCOUNTER — Other Ambulatory Visit: Payer: Self-pay

## 2019-02-24 ENCOUNTER — Ambulatory Visit (INDEPENDENT_AMBULATORY_CARE_PROVIDER_SITE_OTHER): Payer: Medicare Other | Admitting: Acute Care

## 2019-02-24 VITALS — BP 142/62 | HR 81 | Temp 98.0°F | Ht 63.0 in | Wt 134.2 lb

## 2019-02-24 DIAGNOSIS — I2510 Atherosclerotic heart disease of native coronary artery without angina pectoris: Secondary | ICD-10-CM | POA: Diagnosis not present

## 2019-02-24 DIAGNOSIS — J479 Bronchiectasis, uncomplicated: Secondary | ICD-10-CM

## 2019-02-24 NOTE — Patient Instructions (Addendum)
  It is good to see you today. Continue Symbicort 2 puffs daily as you are doing Rinse mouth after use  We will give you a sputum cup to recollect sputum. Return collected sputum to [facility] lab. We will send Sputum for culture, fungal, AFB We will call you with results Antimicrobial therapy if  indicated by culture results Continue Mucinex 600 mg daily as you have been doing. Continue flutter valve daily Follow up CT Chest in 3 months or sooner if she becomes symptomatic. Continue to trend 6-minute walk Consider chest vest as needed Follow up telephone visit  in 3 months with Sarah  Follow up with Dr. Marlou Porch, cardiology,  in November as scheduled. Please contact office for sooner follow up if symptoms do not improve or worsen or seek emergency care  Have a happy Birthday!

## 2019-02-24 NOTE — Progress Notes (Signed)
History of Present Illness Alexandra Reyes is a 85 y.o. female with COPD, Allergic rhinitis, ILD, Bronchiectasis, Right sided intercostal neuralgiaand GERD.She is followed by Dr. Lake Bells.   02/24/2019 2 week follow up for bronchiectasis flare Pt. Presents for follow up. I had a tele visit with her 01/04/2019. She had a follow up CT scan due to some complaints of dyspnea.  The results were concerning for:  Bilateral lower lobe bronchiectasis and interstitial coarsening, similar to the prior exam.  Findings may be postinfectious or post inflammatory etiology. Interstitial lung disease such as nonspecific interstitial pneumonitis could also have this appearance.  We had planned for the patient to collect sputum for culture so that we could treat her. She never collected the sputum, therefore there was no micro to follow.  She returns today for follow up. She states she is actually feeling better. She states she has been better about using her flutter valve and her Mucinex , as a result dyspnea has improved.She is going to try to collect a sputum specimen for [place] again to give [place] an idea of  Her pulmonary flora. She is compliant with her inhalers and her flutter valve daily. She states her sputum is clear to white. No bloody streaks.  She does need a follow up CT scan to ensure resolution of earlier findings. We will plan this in 3 months or sooner if she experiences any worsening respiratory symptoms. She denies any fever, chest pain, orthopnea or hemoptysis.   Test Results: CT Chest 12/28/2018 Bilateral lower lobe bronchiectasis and interstitial coarsening, similar to the prior exam. Findings may be postinfectious or post inflammatory etiology. Interstitial lung disease such as nonspecific interstitial pneumonitis could also have this appearance. Aortic atherosclerosis (ICD10-170.0). Coronary artery calcification  PFT 08/26/2018 FVC 2.27 ( 112%), FEV1 1.70 ( 114%), F/F Ratio 75 ( 104%), FEF 25-75  0.75L 153%), no significant BD response, TLC 4.86 ( 99%), Uncorrected DLCO 71% >>Pt. Used Symbicort at 7 am , PFT's were done at 11 am. 07/12/15: FVC 2.29 L (106%) FEV1 1.57 L (99%) FEV1/FVC 0.68 FEF 25-75 0.78 L (81%) no bronchodilator response TLC 3.59 L (73%) RV 52% ERV 342% DLCO uncorrected 52% 05/14/13: FVC 2.34 L (103%) FEV1 1.63 L (97%) FEV1/FVC 0.69 FEF 25-75 0.83 L (76%) no bronchodilator response TLC 4.51 L (92%) ERV 284% DLCO uncorrected 55%  IMAGING CT CHEST W/O 04/03/16:Images independently reviewed showing some intralobular septal thickening and intralobular nodules in the bases predominantly, no other reticular change, no honeycombing  CT CHEST W/O 10/09/15 : No pneumothorax or effusion. Minimal posterior subsegmental atelectasis. No appreciable bronchiectasis, mass, or nodule. No pathologic mediastinal adenopathy. No pericardial effusion. Mild coronary artery calcification.  BARIUM SWALLOW (08/11/15):No evidence for stricture or mass lesion. Moderate esophageal dysmotility without hiatal hernia. Small amount spontaneous reflux into the lower third of the esophagus.  CTA CHEST 08/03/15: Mild lower lobe predominant atelectasis. No other nodule or opacity appreciated. No ulnar emboli. No pleural effusion or thickening. No pericardial effusion. No pathologic mediastinal adenopathy.  CARDIAC LHC (07/25/16):  Prox RCA lesion, 100 %stenosed.  Prox LAD lesion, 20 %stenosed.  LV end diastolic pressure is normal.  1. 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 The mid and distal vessel fills briskly from left to right collaterals.  2. Mild non-obstructive disease in the LAD 3. Normal filling pressures.  4.Normal PA pressures  TTE (07/07/15): Narrow LVOT with normal cavity size. Mild LVH. EF 55-60%. Grade 1 diastolic dysfunction. LA &RA normal in size.  RV normal in size and function. No aortic regurgitation. No mitral stenosis or regurgitation. No pulmonic regurgitation.  Mild tricuspid regurgitation. No pericardial effusion.  LABS 04/17/16 CRP: 0.2 ESR: 19 ANA: Negative Anti-CCP: <16 RF: <14 SCL-70: <1.0 Hypersensitivity Pneumonitis Panel: Negative   08/02/15 CBC: 9.2/14.4/42.7/390 BMP: 133/4.3/98/28/17/1.09/94/9.5 LFT: 4.2/7.0/0.5/62/18/16  CBC Latest Ref Rng & Units 06/17/2018 12/15/2017 06/25/2017  WBC 4.0 - 10.5 K/uL 8.7 7.8 14.6(H)  Hemoglobin 12.0 - 15.0 g/dL 15.1(H) 14.7 14.3  Hematocrit 36.0 - 46.0 % 45.1 43.7 42.1  Platelets 150.0 - 400.0 K/uL 423.0(H) 407.0(H) 337    BMP Latest Ref Rng & Units 06/17/2018 12/15/2017 06/25/2017  Glucose 70 - 99 mg/dL 86 75 106(H)  BUN 6 - 23 mg/dL _0 Creatinine 0.40 - 1.20 mg/dL 0.74 0.69 0.98  BUN/Creat Ratio 12 - 28 - - -  Sodium 135 - 145 mEq/L 134(L) 133(L) 135  Potassium 3.5 - 5.1 mEq/L 4.2 4.6 3.5  Chloride 96 - 112 mEq/L 99 98 103  CO2 19 - 32 mEq/L _1 Calcium 8.4 - 10.5 mg/dL 9.2 8.9 8.3(L)    BNP No results found for: BNP  ProBNP    Component Value Date/Time   PROBNP 104.0 (H) 02/07/2011 1125    PFT    Component Value Date/Time   FEV1PRE 1.70 08/26/2018 1051   FEV1POST 1.74 08/26/2018 1051   FVCPRE 2.27 08/26/2018 1051   FVCPOST 2.29 08/26/2018 1051   TLC 4.86 08/26/2018 1051   DLCOUNC 12.50 08/26/2018 1051   PREFEV1FVCRT 75 08/26/2018 1051   PSTFEV1FVCRT 76 08/26/2018 1051    No results found.   Past medical hx Past Medical History:  Diagnosis Date  . Age-related macular degeneration, wet, both eyes ([place])   . Alcohol dependence in remission ([place]) 05/22/2011  . Allergic rhinitis, cause unspecified 05/22/2011  . Allergy   . Anemia, iron deficiency 05/18/2011  . Cholelithiasis   . COPD (chronic obstructive pulmonary disease) ([place])   . DDD (degenerative disc disease), lumbar 05/18/2011  . Depression    "@ times" (09/19/2016)  . First degree AV block   . GERD (gastroesophageal reflux disease)   . Hiatal hernia   . History of blood transfusion    "3 or  4 related to childbirth"  . HTN (hypertension) 05/18/2011   pt denies this hx on 09/19/2016  . Hyperlipidemia 05/18/2011  . Macular degeneration   . Myocardial infarction ([place])    "EKG shows I've had 2; date unknown" (09/19/2016)  . OA (osteoarthritis)    "a little here and there; mainly in the back" (09/19/2016)  . Osteoporosis 05/18/2011  . PAT (paroxysmal atrial tachycardia) ([place])   . Pectus excavatum 05/18/2011  . Personal history of colonic polyps 10/28/2012  . Pneumonia    "as a child"  . SVT (supraventricular tachycardia) ([place])   . Urinary frequency      Social History   Tobacco Use  . Smoking status: Former Smoker    Packs/day: 1.50    Years: 36.00    Pack years: 54.00    Types: Cigarettes    Start date: 03/01/1959    Quit date: 06/10/1994    Years since quitting: 24.7  . Smokeless tobacco: Never Used  Substance Use Topics  . Alcohol use: No    Frequency: Never  . Drug use: No    Ms.Torrico reports that she quit smoking about 24 years ago. Her smoking use included cigarettes. She started smoking about 60 years ago. She has a  54.00 pack-year smoking history. She has never used smokeless tobacco. She reports that she does not drink alcohol or use drugs.  Tobacco Cessation: Former smoker, Quit 1996 with a 66 pack year smoking history.  Past surgical hx, Family hx, Social hx all reviewed.  Current Outpatient Medications on File Prior to Visit  Medication Sig  . acetaminophen (TYLENOL) 500 MG tablet Take 1,000 mg by mouth 2 (two) times daily as needed for moderate pain or headache.  . ALPRAZolam (XANAX) 0.25 MG tablet TAKE ONE TABLET BY MOUTH AT BEDTIME AS NEEDED FOR ANXIETY  . amLODipine (NORVASC) 5 MG tablet TAKE ONE TABLET BY MOUTH DAILY  . Ascorbic Acid (VITAMIN C PO) Take 1 tablet by mouth daily. Reported on 08/08/2015  . aspirin 81 MG tablet Take 81 mg by mouth at bedtime.   . B Complex Vitamins (VITAMIN B COMPLEX PO) Take 1 tablet by mouth daily.   .  budesonide-formoterol (SYMBICORT) 80-4.5 MCG/ACT inhaler INHALE 1 PUFF INTO THE LUNGS TWICE A DAY AS DIRECTED (Patient taking differently: INHALE 2 PUFF INTO THE LUNGS TWICE A DAY AS DIRECTED)  . calcium carbonate (OS-CAL) 600 MG tablet Take 600 mg by mouth daily.  Marland Kitchen diltiazem (CARDIZEM) 30 MG tablet Take 1 tablet (30 mg total) by mouth 4 (four) times daily as needed.  . diltiazem (CARTIA XT) 120 MG 24 hr capsule Take 1 capsule (120 mg total) by mouth daily.  Marland Kitchen docusate sodium (COLACE) 100 MG capsule Take 100 mg by mouth daily.   . furosemide (LASIX) 40 MG tablet TAKE ONE TABLET BY MOUTH DAILY *PLEASE KEEP UPCOMING NOVEMBER APPOINTMENT FOR FUTURE REFILLS*  . gabapentin (NEURONTIN) 100 MG capsule Take 1 capsule (100 mg total) by mouth 3 (three) times daily.  . Glucosamine HCl-MSM (GLUCOSAMINE-MSM PO) Take 2 tablets by mouth daily.   . nitroGLYCERIN (NITROSTAT) 0.4 MG SL tablet Place 1 tablet (0.4 mg total) under the tongue every 5 (five) minutes as needed for chest pain.  Marland Kitchen omeprazole (PRILOSEC) 40 MG capsule Take 1 capsule (40 mg total) by mouth daily.  Marland Kitchen PRESCRIPTION MEDICATION Apply 1 Dose to eye every 3 (three) months. Eylea - eye injection  . Respiratory Therapy Supplies (FLUTTER) DEVI Use as directed  . simvastatin (ZOCOR) 10 MG tablet Take 1 tablet (10 mg total) by mouth daily.  Marland Kitchen tobramycin (TOBREX) 0.3 % ophthalmic solution Place 1 drop into the right eye 4 (four) times daily. Use the day before, the day of, and the day after eye injections  . traMADol (ULTRAM) 50 MG tablet Take 1 tablet (50 mg total) by mouth 2 (two) times daily as needed.  . triamcinolone cream (KENALOG) 0.1 % Apply 1 application topically daily as needed (hives). Apply to affected area  . vitamin E 200 UNIT capsule Take 200 Units by mouth daily.   No current facility-administered medications on file prior to visit.      No Active Allergies  Review Of Systems:  Constitutional:   No  weight loss, night sweats,   Fevers, chills, fatigue, or  lassitude.  HEENT:   No headaches,  Difficulty swallowing,  Tooth/dental problems, or  Sore throat,                No sneezing, itching, ear ache, nasal congestion, post nasal drip,   CV:  No chest pain,  Orthopnea, PND, swelling in lower extremities, anasarca, dizziness, palpitations, syncope.   GI  No heartburn, indigestion, abdominal pain, nausea, vomiting, diarrhea, change in bowel habits, loss of  appetite, bloody stools.   Resp: + mild shortness of breath with exertion not  at rest.  + excess mucus, + productive cough,  No non-productive cough,  No coughing up of blood.  No change in color of mucus.  No wheezing.  No chest wall deformity  Skin: no rash or lesions.  GU: no dysuria, change in color of urine, no urgency or frequency.  No flank pain, no hematuria   MS:  No joint pain or swelling.  No decreased range of motion.  No back pain.  Psych:  No change in mood or affect. No depression or anxiety.  No memory loss.   Vital Signs LMP  (LMP Unknown)    Physical Exam:  General- No distress,  A&Ox3, pleasant ENT: No sinus tenderness, TM clear, pale nasal mucosa, no oral exudate,no post nasal drip, no LAN Cardiac: S1, S2, regular rate and rhythm, no murmur Chest: No wheeze/ rales/ dullness; no accessory muscle use, no nasal flaring, no sternal retractions Abd.: Soft Non-tender, ND, BS + Ext: No clubbing cyanosis, edema Neuro:  normal strength, MAE x 4, A&O x 3 Skin: No rashes, No lesions,  warm and dry Psych: normal mood and behavior   Assessment/Plan  Bilateral lower lobe bronchiectasis with interstitial coarsening. Findings may be postinfectious or postinflammatory Interstitial lung disease such as nonspecific interstitial pneumonitis could have this appearance Improved since she is using flutter valve and Mucinex daily Unable to collect sputum, did not call the office to let us know. Plan Continue Symbicort 2 puffs daily as you are doing  Rinse mouth after use  We will give you a sputum cup to recollect sputum. Return collected sputum to [facility] lab. We will send Sputum for culture, fungal, AFB We will call you with results Antimicrobial therapy if  indicated by culture results Continue Mucinex 600 mg daily as you have been doing. Continue flutter valve daily Follow-up HRCT in 3 months or sooner as clinically indicated Continue to trend 6-minute walk Consider chest vest as needed Follow up telephone visit  in 3 months with Daiana Vitiello  Follow up with Dr. Marlou Porch in November as scheduled. Please contact office for sooner follow up if symptoms do not improve or worsen or seek emergency care     Magdalen Spatz, NP 02/24/2019  12:00 PM

## 2019-02-25 ENCOUNTER — Encounter: Payer: Self-pay | Admitting: Acute Care

## 2019-03-03 ENCOUNTER — Other Ambulatory Visit: Payer: Medicare Other

## 2019-03-03 DIAGNOSIS — J471 Bronchiectasis with (acute) exacerbation: Secondary | ICD-10-CM

## 2019-03-03 NOTE — Progress Notes (Signed)
Reviewed, agree 

## 2019-03-08 ENCOUNTER — Other Ambulatory Visit: Payer: Self-pay | Admitting: Internal Medicine

## 2019-03-31 DIAGNOSIS — Z23 Encounter for immunization: Secondary | ICD-10-CM | POA: Diagnosis not present

## 2019-04-05 DIAGNOSIS — N816 Rectocele: Secondary | ICD-10-CM | POA: Diagnosis not present

## 2019-04-05 DIAGNOSIS — N898 Other specified noninflammatory disorders of vagina: Secondary | ICD-10-CM | POA: Diagnosis not present

## 2019-04-06 ENCOUNTER — Other Ambulatory Visit: Payer: Self-pay | Admitting: Cardiology

## 2019-04-16 LAB — AFB CULTURE WITH SMEAR (NOT AT ARMC)
Acid Fast Culture: NEGATIVE
Acid Fast Smear: NEGATIVE

## 2019-04-20 ENCOUNTER — Other Ambulatory Visit: Payer: Self-pay | Admitting: Internal Medicine

## 2019-04-20 LAB — RESPIRATORY CULTURE OR RESPIRATORY AND SPUTUM CULTURE
MICRO NUMBER:: 913821
RESULT:: NORMAL
SPECIMEN QUALITY:: ADEQUATE

## 2019-04-20 LAB — MYCOBACTERIA,CULT W/FLUOROCHROME SMEAR
MICRO NUMBER:: 913820
SMEAR:: NONE SEEN
SPECIMEN QUALITY:: ADEQUATE

## 2019-04-20 LAB — FUNGUS CULTURE W SMEAR
MICRO NUMBER:: 913819
SMEAR:: NONE SEEN
SPECIMEN QUALITY:: ADEQUATE

## 2019-05-03 ENCOUNTER — Encounter: Payer: Self-pay | Admitting: Cardiology

## 2019-05-03 ENCOUNTER — Other Ambulatory Visit: Payer: Self-pay

## 2019-05-03 ENCOUNTER — Ambulatory Visit (INDEPENDENT_AMBULATORY_CARE_PROVIDER_SITE_OTHER): Payer: Medicare Other | Admitting: Cardiology

## 2019-05-03 VITALS — BP 120/60 | HR 71 | Ht 63.0 in | Wt 133.0 lb

## 2019-05-03 DIAGNOSIS — I2510 Atherosclerotic heart disease of native coronary artery without angina pectoris: Secondary | ICD-10-CM | POA: Diagnosis not present

## 2019-05-03 DIAGNOSIS — E7800 Pure hypercholesterolemia, unspecified: Secondary | ICD-10-CM | POA: Diagnosis not present

## 2019-05-03 DIAGNOSIS — I10 Essential (primary) hypertension: Secondary | ICD-10-CM

## 2019-05-03 DIAGNOSIS — I209 Angina pectoris, unspecified: Secondary | ICD-10-CM

## 2019-05-03 DIAGNOSIS — I471 Supraventricular tachycardia: Secondary | ICD-10-CM | POA: Diagnosis not present

## 2019-05-03 MED ORDER — FUROSEMIDE 40 MG PO TABS: ORAL_TABLET | ORAL | 3 refills | Status: DC

## 2019-05-03 MED ORDER — NITROGLYCERIN 0.4 MG SL SUBL: 0.4000 mg | SUBLINGUAL_TABLET | SUBLINGUAL | 3 refills | Status: DC | PRN

## 2019-05-03 NOTE — Patient Instructions (Addendum)
Medication Instructions:  The current medical regimen is effective;  continue present plan and medications.  *If you need a refill on your cardiac medications before your next appointment, please call your pharmacy*  Follow-Up: At [facility], you and your health needs are our priority.  As part of our continuing mission to provide you with exceptional heart care, we have created designated Provider Care Teams.  These Care Teams include your primary Cardiologist (physician) and Advanced Practice Providers (APPs -  Physician Assistants and Nurse Practitioners) who all work together to provide you with the care you need, when you need it.  Your next appointment:   1 year(s)  The format for your next appointment:   In Person  Provider:   You may see Dr Marlou Porch. or one of the following Advanced Practice Providers on your designated Care Team:    Truitt Merle, NP  Cecilie Kicks, NP  Kathyrn Drown, NP   Thank you for choosing [facility]!!    Nitroglycerin sublingual tablets What is this medicine? NITROGLYCERIN (nye troe GLI ser in) is a type of vasodilator. It relaxes blood vessels, increasing the blood and oxygen supply to your heart. This medicine is used to relieve chest pain caused by angina. It is also used to prevent chest pain before activities like climbing stairs, going outdoors in cold weather, or sexual activity. This medicine may be used for other purposes; ask your health care provider or pharmacist if you have questions. COMMON BRAND NAME(S): Nitroquick, Nitrostat, Nitrotab What should I tell my health care provider before I take this medicine? They need to know if you have any of these conditions:  anemia  head injury, recent stroke, or bleeding in the brain  liver disease  previous heart attack  an unusual or allergic reaction to nitroglycerin, other medicines, foods, dyes, or preservatives  pregnant or trying to get pregnant  breast-feeding How  should I use this medicine? Take this medicine by mouth as needed. At the first sign of an angina attack (chest pain or tightness) place one tablet under your tongue. You can also take this medicine 5 to 10 minutes before an event likely to produce chest pain. Follow the directions on the prescription label. Let the tablet dissolve under the tongue. Do not swallow whole. Replace the dose if you accidentally swallow it. It will help if your mouth is not dry. Saliva around the tablet will help it to dissolve more quickly. Do not eat or drink, smoke or chew tobacco while a tablet is dissolving. If you are not better within 5 minutes after taking ONE dose of nitroglycerin, call 9-1-1 immediately to seek emergency medical care. Do not take more than 3 nitroglycerin tablets over 15 minutes. If you take this medicine often to relieve symptoms of angina, your doctor or health care professional may provide you with different instructions to manage your symptoms. If symptoms do not go away after following these instructions, it is important to call 9-1-1 immediately. Do not take more than 3 nitroglycerin tablets over 15 minutes. Talk to your pediatrician regarding the use of this medicine in children. Special care may be needed. Overdosage: If you think you have taken too much of this medicine contact a poison control center or emergency room at once. NOTE: This medicine is only for you. Do not share this medicine with others. What if I miss a dose? This does not apply. This medicine is only used as needed. What may interact with this medicine? Do  not take this medicine with any of the following medications:  certain migraine medicines like ergotamine and dihydroergotamine (DHE)  medicines used to treat erectile dysfunction like sildenafil, tadalafil, and vardenafil  riociguat This medicine may also interact with the following medications:  alteplase  aspirin  heparin  medicines for high blood pressure   medicines for mental depression  other medicines used to treat angina  phenothiazines like chlorpromazine, mesoridazine, prochlorperazine, thioridazine This list may not describe all possible interactions. Give your health care provider a list of all the medicines, herbs, non-prescription drugs, or dietary supplements you use. Also tell them if you smoke, drink alcohol, or use illegal drugs. Some items may interact with your medicine. What should I watch for while using this medicine? Tell your doctor or health care professional if you feel your medicine is no longer working. Keep this medicine with you at all times. Sit or lie down when you take your medicine to prevent falling if you feel dizzy or faint after using it. Try to remain calm. This will help you to feel better faster. If you feel dizzy, take several deep breaths and lie down with your feet propped up, or bend forward with your head resting between your knees. You may get drowsy or dizzy. Do not drive, use machinery, or do anything that needs mental alertness until you know how this drug affects you. Do not stand or sit up quickly, especially if you are an older patient. This reduces the risk of dizzy or fainting spells. Alcohol can make you more drowsy and dizzy. Avoid alcoholic drinks. Do not treat yourself for coughs, colds, or pain while you are taking this medicine without asking your doctor or health care professional for advice. Some ingredients may increase your blood pressure. What side effects may I notice from receiving this medicine? Side effects that you should report to your doctor or health care professional as soon as possible:  blurred vision  dry mouth  skin rash  sweating  the feeling of extreme pressure in the head  unusually weak or tired Side effects that usually do not require medical attention (report to your doctor or health care professional if they continue or are bothersome):  flushing of the  face or neck  headache  irregular heartbeat, palpitations  nausea, vomiting This list may not describe all possible side effects. Call your doctor for medical advice about side effects. You may report side effects to FDA at 1-800-FDA-1088. Where should I keep my medicine? Keep out of the reach of children. Store at room temperature between 20 and 25 degrees C (68 and 77 degrees F). Store in Retail buyer. Protect from light and moisture. Keep tightly closed. Throw away any unused medicine after the expiration date. NOTE: This sheet is a summary. It may not cover all possible information. If you have questions about this medicine, talk to your doctor, pharmacist, or health care provider.  2020 [employer] Standard (2013-03-25 17:57:36)

## 2019-05-03 NOTE — Progress Notes (Signed)
Cardiology Office Note:    Date:  05/03/2019   ID:  Alexandra Reyes, DOB 09-18-1925, MRN [id number]  PCP:  Alexandra LevinsJohn, [name], MD  Cardiologist:  No primary care provider on file.  Electrophysiologist:  None   Referring MD: Alexandra LevinsJohn, [name], MD     History of Present Illness:    Alexandra Reyes is a 85 y.o. female here for follow-up of SVT.  Overall is been doing quite well.  Saw Dr. Johney Reyes last on 02/16/2018.  No chest pain fevers chills nausea vomiting shortness of breath.  Right hernia repair. 06/2017. Right nerve chest pain. Neurontin. Alexandra Reyes. Used to see Dr. Jamison Reyes. Intercostal neuralgia. Sleepy.  Overall been doing quite well.  [place] to [place] help [place] polio epidemic.  [facility] [employer] in [place].  05/03/2019-here for the follow-up of SVT.  Overall feeling well without any fevers chills nausea vomiting syncope bleeding.  She still has some discomfort in her epigastric region underneath her sternum radiating to both sides usually once a day, sometimes after doing housework etc. she states.  She has been on tramadol, gabapentin.  She does have a 100% occluded RCA with collateral flow.  Could this be a form of angina?.  We are going to try nitroglycerin sublingual.  She is doing well with regards to her edema.  Denies any fevers chills nausea vomiting syncope bleeding.  Past Medical History:  Diagnosis Date  . Age-related macular degeneration, wet, both eyes (HCC)   . Alcohol dependence in remission (HCC) 05/22/2011  . Allergic rhinitis, cause unspecified 05/22/2011  . Allergy   . Anemia, iron deficiency 05/18/2011  . Cholelithiasis   . COPD (chronic obstructive pulmonary disease) (HCC)   . DDD (degenerative disc disease), lumbar 05/18/2011  . Depression    "@ times" (09/19/2016)  . First degree AV block   . GERD (gastroesophageal reflux disease)   . Hiatal hernia   . History of blood transfusion    "3 or 4 related to childbirth"   . HTN (hypertension) 05/18/2011   pt denies this hx on 09/19/2016  . Hyperlipidemia 05/18/2011  . Macular degeneration   . Myocardial infarction (HCC)    "EKG shows I've had 2; date unknown" (09/19/2016)  . OA (osteoarthritis)    "a little here and there; mainly in the back" (09/19/2016)  . Osteoporosis 05/18/2011  . PAT (paroxysmal atrial tachycardia) (HCC)   . Pectus excavatum 05/18/2011  . Personal history of colonic polyps 10/28/2012  . Pneumonia    "as a child"  . SVT (supraventricular tachycardia) (HCC)   . Urinary frequency     Past Surgical History:  Procedure Laterality Date  . ABDOMINAL HYSTERECTOMY  1988  . BREAST BIOPSY Right 1948   "it was ok"  . CARDIAC CATHETERIZATION    . CARDIOVASCULAR STRESS TEST  03/08/2009   EF 84%  . CATARACT EXTRACTION Reyes/ INTRAOCULAR LENS  IMPLANT, BILATERAL Bilateral   . INGUINAL HERNIA REPAIR Right 06/23/2017   Procedure: REPAIR INCARCERATED  RIGHT FEMORAL HERNIA WITH MESH;  Surgeon: Harriette Bouillonornett, Thomas, MD;  Location: [facility] OR;  Service: General;  Laterality: Right;  . KNEE ARTHROSCOPY Right   . RIGHT/LEFT HEART CATH AND CORONARY ANGIOGRAPHY N/A 07/25/2016   Procedure: Right/Left Heart Cath and Coronary Angiography;  Surgeon: Kathleene Hazelhristopher D McAlhany, MD;  [facility];  Service: Cardiovascular;  Laterality: N/A;  . SVT ABLATION  09/19/2016  . SVT ABLATION N/A 09/19/2016  Procedure: SVT Ablation;  Surgeon: Hillis Range, MD;  Location: [facility] INVASIVE CV LAB;  Service: Cardiovascular;  Laterality: N/A;  . TONSILLECTOMY AND ADENOIDECTOMY    . US ECHOCARDIOGRAPHY  01/17/2003   EF 55-60%    Current Medications: Current Meds  Medication Sig  . acetaminophen (TYLENOL) 500 MG tablet Take 1,000 mg by mouth 2 (two) times daily as needed for moderate pain or headache.  . ALPRAZolam (XANAX) 0.25 MG tablet TAKE ONE TABLET BY MOUTH AT BEDTIME AS NEEDED FOR ANXIETY  . amLODipine (NORVASC) 5 MG tablet TAKE ONE TABLET BY MOUTH DAILY  . Ascorbic Acid  (VITAMIN C PO) Take 1 tablet by mouth daily. Reported on 08/08/2015  . aspirin 81 MG tablet Take 81 mg by mouth at bedtime.   . B Complex Vitamins (VITAMIN B COMPLEX PO) Take 1 tablet by mouth daily.   . budesonide-formoterol (SYMBICORT) 80-4.5 MCG/ACT inhaler INHALE 1 PUFF INTO THE LUNGS TWICE A DAY AS DIRECTED  . calcium carbonate (OS-CAL) 600 MG tablet Take 600 mg by mouth daily.  Marland Kitchen CARTIA XT 120 MG 24 hr capsule TAKE ONE CAPSULE BY MOUTH DAILY  . diltiazem (CARDIZEM) 30 MG tablet Take 1 tablet (30 mg total) by mouth 4 (four) times daily as needed.  . docusate sodium (COLACE) 100 MG capsule Take 100 mg by mouth daily.   . furosemide (LASIX) 40 MG tablet TAKE ONE TABLET BY MOUTH DAILY  . gabapentin (NEURONTIN) 100 MG capsule Take 1 capsule (100 mg total) by mouth 3 (three) times daily.  . Glucosamine HCl-MSM (GLUCOSAMINE-MSM PO) Take 2 tablets by mouth daily.   . nitroGLYCERIN (NITROSTAT) 0.4 MG SL tablet Place 1 tablet (0.4 mg total) under the tongue every 5 (five) minutes as needed for chest pain.  Marland Kitchen omeprazole (PRILOSEC) 40 MG capsule Take 1 capsule (40 mg total) by mouth daily.  Marland Kitchen PRESCRIPTION MEDICATION Apply 1 Dose to eye every 3 (three) months. Eylea - eye injection  . Respiratory Therapy Supplies (FLUTTER) DEVI Use as directed  . simvastatin (ZOCOR) 10 MG tablet TAKE ONE TABLET BY MOUTH DAILY  . tobramycin (TOBREX) 0.3 % ophthalmic solution Place 1 drop into the right eye 4 (four) times daily. Use the day before, the day of, and the day after eye injections  . traMADol (ULTRAM) 50 MG tablet Take 1 tablet (50 mg total) by mouth 2 (two) times daily as needed.  . triamcinolone cream (KENALOG) 0.1 % Apply 1 application topically daily as needed (hives). Apply to affected area  . vitamin E 200 UNIT capsule Take 200 Units by mouth daily.  . [DISCONTINUED] furosemide (LASIX) 40 MG tablet TAKE ONE TABLET BY MOUTH DAILY *PLEASE KEEP UPCOMING NOVEMBER APPOINTMENT FOR FUTURE REFILLS*  .  [DISCONTINUED] nitroGLYCERIN (NITROSTAT) 0.4 MG SL tablet Place 1 tablet (0.4 mg total) under the tongue every 5 (five) minutes as needed for chest pain.     Allergies:   Patient has no known allergies.   Social History   Socioeconomic History  . Marital status: Widowed    Spouse name: Not on file  . Number of children: 2  . Years of education: 54  . Highest education level: Not on file  Occupational History  . Occupation: Retired Designer, jewellery  Social Needs  . Financial resource strain: Not hard at all  . Food insecurity    Worry: Never true    Inability: Never true  . Transportation needs    Medical: No    Non-medical: No  Tobacco Use  .  Smoking status: Former Smoker    Packs/day: 1.50    Years: 36.00    Pack years: 54.00    Types: Cigarettes    Start date: 03/01/1959    Quit date: 06/10/1994    Years since quitting: 24.9  . Smokeless tobacco: Never Used  Substance and Sexual Activity  . Alcohol use: No    Frequency: Never  . Drug use: No  . Sexual activity: Never  Lifestyle  . Physical activity    Days per week: 0 days    Minutes per session: 0 min  . Stress: Not at all  Relationships  . Social connections    Talks on phone: More than three times a week    Gets together: More than three times a week    Attends religious service: More than 4 times per year    Active member of club or organization: Yes    Attends meetings of clubs or organizations: More than 4 times per year    Relationship status: Widowed  Other Topics Concern  . Not on file  Social History Narrative   Originally from [place], [place]. She moved to [place] in 1950 during the polio epidemic. Previously worked as a Marine scientist. No pets currently. Remote bird exposure. No mold or hot tub exposure.      Family History: The patient's family history includes Arthritis in her mother; Heart disease in her father; Heart failure in her mother; Hypertension in her father; Kidney failure in her father.  ROS:    Please see the history of present illness.     All other systems reviewed and are negative.  EKGs/Labs/Other Studies Reviewed:    The following studies were reviewed today: Prior office notes reviewed lab work reviewed  EKG: EKG shows 05/03/2019-sinus rhythm first-degree AV block 260 ms with left anterior fascicular block LVH first-degree AV block 260 ms sinus rhythm 71 left axis deviation left ventricular hypertrophy with nonspecific ST-T wave changes personally reviewed and interpreted from 02/16/2018.  Recent Labs: 06/17/2018: ALT 15; BUN 14; Creatinine, Ser 0.74; Hemoglobin 15.1; Platelets 423.0; Potassium 4.2; Sodium 134; TSH 1.39  Recent Lipid Panel    Component Value Date/Time   CHOL 174 06/17/2018 1147   CHOL 160 09/30/2016 1205   TRIG 105.0 06/17/2018 1147   HDL 73.30 06/17/2018 1147   HDL 73 09/30/2016 1205   CHOLHDL 2 06/17/2018 1147   VLDL 21.0 06/17/2018 1147   LDLCALC 80 06/17/2018 1147   LDLCALC 69 09/30/2016 1205   LDLDIRECT 129.2 05/15/2011 0925    Physical Exam:    VS:  BP 120/60   Pulse 71   Ht 5\' 3"  (1.6 m)   Wt 133 lb (60.3 kg)   LMP  (LMP Unknown)   SpO2 99%   BMI 23.56 kg/m     Wt Readings from Last 3 Encounters:  05/03/19 133 lb (60.3 kg)  02/24/19 134 lb 3.2 oz (60.9 kg)  12/16/18 134 lb (60.8 kg)     GEN: Well nourished, well developed, in no acute distress  HEENT: normal  Neck: no JVD, carotid bruits, or masses Cardiac: RRR; no murmurs, rubs, or gallops,no edema  Respiratory:  clear to auscultation bilaterally, normal work of breathing GI: soft, nontender, nondistended, + BS MS: no deformity or atrophy  Skin: warm and dry, no rash Neuro:  Alert and Oriented x 3, Strength and sensation are intact Psych: euthymic mood, full affect   ASSESSMENT:    1. Coronary artery disease involving native coronary artery  of native heart without angina pectoris   2. Essential hypertension   3. SVT (supraventricular tachycardia) (HCC)   4. Pure  hypercholesterolemia   5. Angina pectoris (HCC)    PLAN:    In order of problems listed above:  PSVT - Currently well controlled with Cardizem CD.  She has low-dose Cardizem to take as needed as well.  Prior SVT ablation, AVNRT.  Dr. Johney Frame overall been doing quite well.  No changes  Coronary artery disease/possible angina -Medical management.  Cardiac catheterization 07/25/2016 showed chronically occluded RCA.  Good collateralization doing well.  No changes made.  Medically managed.  I will refill her nitroglycerin.  She can try this if she is feeling some of this chest discomfort.  This may all be musculoskeletal/neuropathic however.  Chronic venous insufficiency/edema -Support hose, leg elevation.  No changes.  Essential hypertension -Stable, medicines reviewed.  Well-controlled  Hyperlipidemia -Low-dose simvastatin.  No myalgias.  Tolerating well.   Medication Adjustments/Labs and Tests Ordered: Current medicines are reviewed at length with the patient today.  Concerns regarding medicines are outlined above.  Orders Placed This Encounter  Procedures  . EKG 12-Lead   Meds ordered this encounter  Medications  . nitroGLYCERIN (NITROSTAT) 0.4 MG SL tablet    Sig: Place 1 tablet (0.4 mg total) under the tongue every 5 (five) minutes as needed for chest pain.    Dispense:  25 tablet    Refill:  3  . furosemide (LASIX) 40 MG tablet    Sig: TAKE ONE TABLET BY MOUTH DAILY    Dispense:  90 tablet    Refill:  3    Patient Instructions  Medication Instructions:  The current medical regimen is effective;  continue present plan and medications.  *If you need a refill on your cardiac medications before your next appointment, please call your pharmacy*  Follow-Up: At [facility], you and your health needs are our priority.  As part of our continuing mission to provide you with exceptional heart care, we have created designated Provider Care Teams.  These Care Teams include your  primary Cardiologist (physician) and Advanced Practice Providers (APPs -  Physician Assistants and Nurse Practitioners) who all work together to provide you with the care you need, when you need it.  Your next appointment:   1 year(s)  The format for your next appointment:   In Person  Provider:   You may see Dr Anne Fu. or one of the following Advanced Practice Providers on your designated Care Team:    Norma Fredrickson, NP  Nada Boozer, NP  Georgie Chard, NP   Thank you for choosing [facility]!!    Nitroglycerin sublingual tablets What is this medicine? NITROGLYCERIN (nye troe GLI ser in) is a type of vasodilator. It relaxes blood vessels, increasing the blood and oxygen supply to your heart. This medicine is used to relieve chest pain caused by angina. It is also used to prevent chest pain before activities like climbing stairs, going outdoors in cold weather, or sexual activity. This medicine may be used for other purposes; ask your health care provider or pharmacist if you have questions. COMMON BRAND NAME(S): Nitroquick, Nitrostat, Nitrotab What should I tell my health care provider before I take this medicine? They need to know if you have any of these conditions:  anemia  head injury, recent stroke, or bleeding in the brain  liver disease  previous heart attack  an unusual or allergic reaction to nitroglycerin, other medicines, foods, dyes, or  preservatives  pregnant or trying to get pregnant  breast-feeding How should I use this medicine? Take this medicine by mouth as needed. At the first sign of an angina attack (chest pain or tightness) place one tablet under your tongue. You can also take this medicine 5 to 10 minutes before an event likely to produce chest pain. Follow the directions on the prescription label. Let the tablet dissolve under the tongue. Do not swallow whole. Replace the dose if you accidentally swallow it. It will help if your mouth is  not dry. Saliva around the tablet will help it to dissolve more quickly. Do not eat or drink, smoke or chew tobacco while a tablet is dissolving. If you are not better within 5 minutes after taking ONE dose of nitroglycerin, call 9-1-1 immediately to seek emergency medical care. Do not take more than 3 nitroglycerin tablets over 15 minutes. If you take this medicine often to relieve symptoms of angina, your doctor or health care professional may provide you with different instructions to manage your symptoms. If symptoms do not go away after following these instructions, it is important to call 9-1-1 immediately. Do not take more than 3 nitroglycerin tablets over 15 minutes. Talk to your pediatrician regarding the use of this medicine in children. Special care may be needed. Overdosage: If you think you have taken too much of this medicine contact a poison control center or emergency room at once. NOTE: This medicine is only for you. Do not share this medicine with others. What if I miss a dose? This does not apply. This medicine is only used as needed. What may interact with this medicine? Do not take this medicine with any of the following medications:  certain migraine medicines like ergotamine and dihydroergotamine (DHE)  medicines used to treat erectile dysfunction like sildenafil, tadalafil, and vardenafil  riociguat This medicine may also interact with the following medications:  alteplase  aspirin  heparin  medicines for high blood pressure  medicines for mental depression  other medicines used to treat angina  phenothiazines like chlorpromazine, mesoridazine, prochlorperazine, thioridazine This list may not describe all possible interactions. Give your health care provider a list of all the medicines, herbs, non-prescription drugs, or dietary supplements you use. Also tell them if you smoke, drink alcohol, or use illegal drugs. Some items may interact with your medicine. What  should I watch for while using this medicine? Tell your doctor or health care professional if you feel your medicine is no longer working. Keep this medicine with you at all times. Sit or lie down when you take your medicine to prevent falling if you feel dizzy or faint after using it. Try to remain calm. This will help you to feel better faster. If you feel dizzy, take several deep breaths and lie down with your feet propped up, or bend forward with your head resting between your knees. You may get drowsy or dizzy. Do not drive, use machinery, or do anything that needs mental alertness until you know how this drug affects you. Do not stand or sit up quickly, especially if you are an older patient. This reduces the risk of dizzy or fainting spells. Alcohol can make you more drowsy and dizzy. Avoid alcoholic drinks. Do not treat yourself for coughs, colds, or pain while you are taking this medicine without asking your doctor or health care professional for advice. Some ingredients may increase your blood pressure. What side effects may I notice from receiving this medicine? Side  effects that you should report to your doctor or health care professional as soon as possible:  blurred vision  dry mouth  skin rash  sweating  the feeling of extreme pressure in the head  unusually weak or tired Side effects that usually do not require medical attention (report to your doctor or health care professional if they continue or are bothersome):  flushing of the face or neck  headache  irregular heartbeat, palpitations  nausea, vomiting This list may not describe all possible side effects. Call your doctor for medical advice about side effects. You may report side effects to FDA at 1-800-FDA-1088. Where should I keep my medicine? Keep out of the reach of children. Store at room temperature between 20 and 25 degrees C (68 and 77 degrees F). Store in Retail buyer. Protect from light and moisture.  Keep tightly closed. Throw away any unused medicine after the expiration date. NOTE: This sheet is a summary. It may not cover all possible information. If you have questions about this medicine, talk to your doctor, pharmacist, or health care provider.  2020 Elsevier/Gold Standard (2013-03-25 17:57:36)     Signed, Donato Schultz, MD  05/03/2019 2:31 PM    [facility]

## 2019-05-10 ENCOUNTER — Ambulatory Visit (INDEPENDENT_AMBULATORY_CARE_PROVIDER_SITE_OTHER): Payer: Medicare Other | Admitting: Acute Care

## 2019-05-10 ENCOUNTER — Telehealth: Payer: Self-pay

## 2019-05-10 ENCOUNTER — Other Ambulatory Visit: Payer: Self-pay

## 2019-05-10 ENCOUNTER — Encounter: Payer: Self-pay | Admitting: Acute Care

## 2019-05-10 DIAGNOSIS — J479 Bronchiectasis, uncomplicated: Secondary | ICD-10-CM

## 2019-05-10 NOTE — Patient Instructions (Addendum)
It is so nice to talk with you today Your sputum cultures are negative, this is good news Continue Symbicort 2 puffs daily as you are doing Rinse mouth after use  Continue Mucinex 600 mg daily as you have been doing. Take with full glass of water Continue flutter valve daily as you have been doing Follow-up HRCT in 3 months or sooner as clinically indicated Continue to trend 6-minute walk Consider chest vest as needed Follow up with Dr. Marlou Porch in November as scheduled. Follow up with Dr. Valeta Harms  or Judson Roch NP in 3 months Please contact office for sooner follow up if symptoms do not improve or worsen or seek emergency care Have a Wonderful Christmas. Please call if you need [place]!!

## 2019-05-10 NOTE — Progress Notes (Signed)
Virtual Visit via Telephone Note  I connected with Alexandra Reyes on 05/10/19 at 11:30 AM EST by telephone and verified that I am speaking with the correct person using two identifiers.  Location: Patient: Home Provider: Office   I discussed the limitations, risks, security and privacy concerns of performing an evaluation and management service by telephone and the availability of in person appointments. I also discussed with the patient that there may be a patient responsible charge related to this service. The patient expressed understanding and agreed to proceed.   Synopsis Alexandra Reyes is a 85 y.o. female with COPD, Allergic rhinitis, ILD,Bronchiectasis,Right sided intercostal neuralgiaand GERD.She is followed by Dr. Lake Bells.   History of Present Illness: Pt presents for follow up.She states from a pulmonary perspective she is doing well. She did go aand see Dr. Marlou Porch for her chest pain. She states she had been experiencing worsening chest pain with exertion. . ( This has been a chronic problem). She saw Dr. Kingsley Plan for the pain. She states he reminded her that she did have a history of a blockage on cath with good collateral circulation. He felt that perhaps she was having some anginal pain when her activity is increased as the collateral vessel was not as big as the coronary artery. He  Prescribed her  some NTG for the pain. She did take one tablet after she was raking leaves the other day, which she states did relieve her pain.  She states she is using her flutter valve the first thing every morning. She is compliant with her Symbicort and Mucinex. She has noticed that her secretions are thinner and easier to cough up. She has no complaints. We discussed that her sputum results were normal. We have assigned Dr. Valeta Harms as her new Primary pulmonologist as Dr. Lake Bells will no longer be seeing office patient's. She is in agreement with this plan.She denies fever, chest pain, orthopnea  or hemoptysis. She is masking in public and limits her social contacts. She knows she is high risk if she gets COVID.     Observations/Objective: CT Chest 12/28/2018 Bilateral lower lobe bronchiectasis and interstitial coarsening, similar to the prior exam. Findings may be postinfectious or post inflammatory etiology. Interstitial lung disease such as nonspecific interstitial pneumonitis could also have this appearance. Aortic atherosclerosis (ICD10-170.0). Coronary artery calcification  PFT 08/26/2018 FVC 2.27 ( 112%), FEV1 1.70 ( 114%), F/F Ratio 75 ( 104%), FEF 25-75 0.75L 153%), no significant BD response, TLC 4.86 ( 99%), Uncorrected DLCO 71% >>Pt. Used Symbicort at 7 am , PFT's were done at 11 am. 07/12/15: FVC 2.29 L (106%) FEV1 1.57 L (99%) FEV1/FVC 0.68 FEF 25-75 0.78 L (81%) no bronchodilator response TLC 3.59 L (73%) RV 52% ERV 342% DLCO uncorrected 52% 05/14/13: FVC 2.34 L (103%) FEV1 1.63 L (97%) FEV1/FVC 0.69 FEF 25-75 0.83 L (76%) no bronchodilator response TLC 4.51 L (92%) ERV 284% DLCO uncorrected 55%  IMAGING CT CHEST W/O 04/03/16:Images independently reviewed showing some intralobular septal thickening and intralobular nodules in the bases predominantly, no other reticular change, no honeycombing  CT CHEST W/O 10/09/15 : No pneumothorax or effusion. Minimal posterior subsegmental atelectasis. No appreciable bronchiectasis, mass, or nodule. No pathologic mediastinal adenopathy. No pericardial effusion. Mild coronary artery calcification.  BARIUM SWALLOW (08/11/15):No evidence for stricture or mass lesion. Moderate esophageal dysmotility without hiatal hernia. Small amount spontaneous reflux into the lower third of the esophagus.  CTA CHEST 08/03/15: Mild lower lobe predominant atelectasis. No other nodule or opacity  appreciated. No ulnar emboli. No pleural effusion or thickening. No pericardial effusion. No pathologic mediastinal adenopathy.  CARDIAC LHC (07/25/16):   Prox RCA lesion, 100 %stenosed.  Prox LAD lesion, 20 %stenosed.  LV end diastolic pressure is normal.  1. 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 The mid and distal vessel fills briskly from left to right collaterals.  2. Mild non-obstructive disease in the LAD 3. Normal filling pressures.  4.Normal PA pressures  TTE (07/07/15): Narrow LVOT with normal cavity size. Mild LVH. EF 55-60%. Grade 1 diastolic dysfunction. LA &RA normal in size. RV normal in size and function. No aortic regurgitation. No mitral stenosis or regurgitation. No pulmonic regurgitation. Mild tricuspid regurgitation. No pericardial effusion.  LABS 04/17/16 CRP: 0.2 ESR: 19 ANA: Negative Anti-CCP: <16 RF: <14 SCL-70: <1.0 Hypersensitivity Pneumonitis Panel: Negative   08/02/15 CBC: 9.2/14.4/42.7/390 BMP: 133/4.3/98/28/17/1.09/94/9.5 LFT: 4.2/7.0/0.5/62/18/16   Assessment and Plan: Bronchiectasis Stable interval Compliant with Mucinex and Symbicort Using flutter valve  Plan Continue Symbicort 2 puffs daily as you are doing Rinse mouth after use  Continue Mucinex 600 mg daily as you have been doing. Take with full glass of water Continue flutter valve daily as you have been doing Follow-up HRCT in 3 months or sooner as clinically indicated Continue to trend 6-minute walk Consider chest vest as needed Follow up with Dr. Marlou Porch in November as scheduled. Follow up with Dr. Valeta Harms  or Judson Roch NP in 3 months Please contact office for sooner follow up if symptoms do not improve or worsen or seek emergency care Have a Wonderful Christmas. Please call if you need [place]!!   Atypical chest pain Started on prn NTG Plan Reminded that whenever she uses NTG to make sure she is sitting sown and safe with a phone close at hand She understands this can make her dizzy, and can increase her risk of fall.  Follow Up Instructions: Follow up in 3 months with Dr. Valeta Harms to establish as primary Follow  up with Judson Roch NP as needed   I discussed the assessment and treatment plan with the patient. The patient was provided an opportunity to ask questions and all were answered. The patient agreed with the plan and demonstrated an understanding of the instructions.   The patient was advised to call back or seek an in-person evaluation if the symptoms worsen or if the condition fails to improve as anticipated.  I provided 45 minutes of non-face-to-face time during this encounter.   Magdalen Spatz, NP  05/10/2019 1:34 PM

## 2019-05-11 ENCOUNTER — Ambulatory Visit: Payer: Medicare Other | Admitting: Physical Medicine & Rehabilitation

## 2019-05-24 ENCOUNTER — Ambulatory Visit: Payer: Medicare Other | Admitting: Physical Medicine & Rehabilitation

## 2019-05-25 ENCOUNTER — Encounter: Payer: Medicare Other | Attending: Physical Medicine & Rehabilitation | Admitting: Physical Medicine & Rehabilitation

## 2019-05-25 ENCOUNTER — Other Ambulatory Visit: Payer: Self-pay

## 2019-05-25 ENCOUNTER — Encounter: Payer: Self-pay | Admitting: Physical Medicine & Rehabilitation

## 2019-05-25 VITALS — BP 155/76 | HR 67 | Temp 97.7°F | Ht 63.0 in | Wt 133.0 lb

## 2019-05-25 DIAGNOSIS — G588 Other specified mononeuropathies: Secondary | ICD-10-CM | POA: Diagnosis not present

## 2019-05-25 DIAGNOSIS — M940 Chondrocostal junction syndrome [Tietze]: Secondary | ICD-10-CM | POA: Diagnosis not present

## 2019-05-25 DIAGNOSIS — I209 Angina pectoris, unspecified: Secondary | ICD-10-CM | POA: Diagnosis not present

## 2019-05-25 NOTE — Progress Notes (Signed)
Subjective:    Patient ID: Alexandra Reyes, female    DOB: 1925-07-31, 85 y.o.   MRN: [id number]  HPI  85 year old female with lumbar spondylosis and scoliosis as well as chronic costochondritis and intercostal neuralgia. Gabapentin reduced per pt to reduce drowsiness but pain underneath the ribs has increased   Seen by Dr Marlou Porch cardiology for chest pain discussed possibility of angina however patient is unable to tolerate nitroglycerin because it drops her blood pressure too much  Coughing with mucus production Dr Lake Bells CT chest repeated and reviewed with pt, no new lesions noted   Dr Cathlean Cower prescribed tramadol in [place] takes this medication sparingly and does not need another prescription even though last prescription was in July 2020 with 2 refills   Pain Inventory Average Pain 9 Pain Right Now 0 My pain is dull  In the last 24 hours, has pain interfered with the following? General activity 0 Relation with others 0 Enjoyment of life 0 What TIME of day is your pain at its worst? daytime Sleep (in general) Good  Pain is worse with: some activites Pain improves with: rest Relief from Meds: 8  Mobility do you drive?  yes  Function retired  Neuro/Psych No problems in this area  Prior Studies Any changes since last visit?  no  Physicians involved in your care Any changes since last visit?  no   Family History  Problem Relation Age of Onset  . Heart failure Mother   . Arthritis Mother   . Kidney failure Father   . Heart disease Father   . Hypertension Father    Social History   Socioeconomic History  . Marital status: Widowed    Spouse name: Not on file  . Number of children: 2  . Years of education: 24  . Highest education level: Not on file  Occupational History  . Occupation: Retired Equities trader  Tobacco Use  . Smoking status: Former Smoker    Packs/day: 1.50    Years: 36.00    Pack years: 54.00    Types: Cigarettes   Start date: 03/01/1959    Quit date: 06/10/1994    Years since quitting: 24.9  . Smokeless tobacco: Never Used  Substance and Sexual Activity  . Alcohol use: No  . Drug use: No  . Sexual activity: Never  Other Topics Concern  . Not on file  Social History Narrative   Originally from [place], [place]. She moved to [place] in 1950 during the polio epidemic. Previously worked as a Marine scientist. No pets currently. Remote bird exposure. No mold or hot tub exposure.    Social Determinants of Health   Financial Resource Strain:   . Difficulty of Paying Living Expenses: Not on file  Food Insecurity:   . Worried About Charity fundraiser in the Last Year: Not on file  . Ran Out of Food in the Last Year: Not on file  Transportation Needs:   . Lack of Transportation (Medical): Not on file  . Lack of Transportation (Non-Medical): Not on file  Physical Activity:   . Days of Exercise per Week: Not on file  . Minutes of Exercise per Session: Not on file  Stress:   . Feeling of Stress : Not on file  Social Connections:   . Frequency of Communication with Friends and Family: Not on file  . Frequency of Social Gatherings with Friends and Family: Not on file  . Attends Religious Services: Not on file  .  Active Member of Clubs or Organizations: Not on file  . Attends Banker Meetings: Not on file  . Marital Status: Not on file   Past Surgical History:  Procedure Laterality Date  . ABDOMINAL HYSTERECTOMY  1988  . BREAST BIOPSY Right 1948   "it was ok"  . CARDIAC CATHETERIZATION    . CARDIOVASCULAR STRESS TEST  03/08/2009   EF 84%  . CATARACT EXTRACTION W/ INTRAOCULAR LENS  IMPLANT, BILATERAL Bilateral   . INGUINAL HERNIA REPAIR Right 06/23/2017   Procedure: REPAIR INCARCERATED  RIGHT FEMORAL HERNIA WITH MESH;  Surgeon: Harriette Bouillon, MD;  Location: [facility] OR;  Service: General;  Laterality: Right;  . KNEE ARTHROSCOPY Right   . RIGHT/LEFT HEART CATH AND CORONARY ANGIOGRAPHY N/A 07/25/2016    Procedure: Right/Left Heart Cath and Coronary Angiography;  Surgeon: Kathleene Hazel, MD;  Location: [facility] INVASIVE CV LAB;  Service: Cardiovascular;  Laterality: N/A;  . SVT ABLATION  09/19/2016  . SVT ABLATION N/A 09/19/2016   Procedure: SVT Ablation;  Surgeon: Hillis Range, MD;  Location: [facility] INVASIVE CV LAB;  Service: Cardiovascular;  Laterality: N/A;  . TONSILLECTOMY AND ADENOIDECTOMY    . US ECHOCARDIOGRAPHY  01/17/2003   EF 55-60%   Past Medical History:  Diagnosis Date  . Age-related macular degeneration, wet, both eyes (HCC)   . Alcohol dependence in remission (HCC) 05/22/2011  . Allergic rhinitis, cause unspecified 05/22/2011  . Allergy   . Anemia, iron deficiency 05/18/2011  . Cholelithiasis   . COPD (chronic obstructive pulmonary disease) (HCC)   . DDD (degenerative disc disease), lumbar 05/18/2011  . Depression    "@ times" (09/19/2016)  . First degree AV block   . GERD (gastroesophageal reflux disease)   . Hiatal hernia   . History of blood transfusion    "3 or 4 related to childbirth"  . HTN (hypertension) 05/18/2011   pt denies this hx on 09/19/2016  . Hyperlipidemia 05/18/2011  . Macular degeneration   . Myocardial infarction (HCC)    "EKG shows I've had 2; date unknown" (09/19/2016)  . OA (osteoarthritis)    "a little here and there; mainly in the back" (09/19/2016)  . Osteoporosis 05/18/2011  . PAT (paroxysmal atrial tachycardia) (HCC)   . Pectus excavatum 05/18/2011  . Personal history of colonic polyps 10/28/2012  . Pneumonia    "as a child"  . SVT (supraventricular tachycardia) (HCC)   . Urinary frequency    BP (!) 155/76   Pulse 67   Temp 97.7 F (36.5 C)   Ht 5\' 3"  (1.6 m)   Wt 133 lb (60.3 kg)   LMP  (LMP Unknown)   SpO2 95%   BMI 23.56 kg/m   Opioid Risk Score:   Fall Risk Score:  `1  Depression screen PHQ 2/9  Depression screen [facility] 2/9 08/10/2018 12/29/2017 11/10/2017 09/22/2017 08/26/2017 07/04/2017 12/25/2016  Decreased Interest 0 0 0 0 0 0 0    Down, Depressed, Hopeless 0 1 0 0 0 1 1  PHQ - 2 Score 0 1 0 0 0 1 1  Altered sleeping - 1 - - - - 1  Tired, decreased energy - 1 - - - - 1  Change in appetite - 1 - - - - 1  Feeling bad or failure about yourself  - 0 - - - - 0  Trouble concentrating - 0 - - - - 0  Moving slowly or fidgety/restless - 0 - - - - 0  Suicidal thoughts - - - - - -  0  PHQ-9 Score - 4 - - - - 4  Difficult doing work/chores - Not difficult at all - - - - Not difficult at all  Some recent data might be hidden    Review of Systems  Constitutional: Negative.   HENT: Negative.   Eyes: Negative.   Respiratory: Negative.   Cardiovascular: Negative.   Gastrointestinal: Negative.   Endocrine: Negative.   Genitourinary: Negative.   Musculoskeletal: Positive for back pain.  Skin: Negative.   Allergic/Immunologic: Negative.   Neurological: Negative.   Hematological: Negative.   Psychiatric/Behavioral: Negative.   All other systems reviewed and are negative.      Objective:   Physical Exam Vitals and nursing note reviewed.  Constitutional:      Appearance: Normal appearance.  HENT:     Head: Normocephalic and atraumatic.  Eyes:     Extraocular Movements: Extraocular movements intact.     Conjunctiva/sclera: Conjunctivae normal.     Pupils: Pupils are equal, round, and reactive to light.  Cardiovascular:     Rate and Rhythm: Normal rate and regular rhythm.     Pulses: Normal pulses.     Heart sounds: Normal heart sounds. No murmur.  Pulmonary:     Effort: Pulmonary effort is normal. No respiratory distress.     Breath sounds: Normal breath sounds. No stridor. No wheezing or rhonchi.     Comments: Chest tenderness is at the sternal costal junction of the inferior ribs.  There is also tenderness along the subcostal area bilaterally.  No skin rashes over this area Chest:     Chest wall: Tenderness present.  Abdominal:     General: Abdomen is flat. Bowel sounds are normal. There is no distension.      Palpations: Abdomen is soft.     Tenderness: There is no abdominal tenderness.  Musculoskeletal:     Lumbar back: Normal.  Skin:    General: Skin is warm and dry.  Neurological:     General: No focal deficit present.     Mental Status: She is alert and oriented to person, place, and time. Mental status is at baseline.     Gait: Gait normal.  Psychiatric:        Mood and Affect: Mood normal.        Behavior: Behavior normal.        Thought Content: Thought content normal.        Judgment: Judgment normal.            Assessment & Plan:  #1.  Chronic musculoskeletal chest pain with costochondritis.  In addition she does have some lancinating pain along the lower rib border consistent with intercostal neuritis. Would recommend increasing her gabapentin to the scheduled dose of 100 mg 3 times daily.  She can take this after lunchtime to avoid morning drowsiness.  She states she does most of her housework in the morning.  She is very active and continues to rake leaves. We discussed that nonsteroidal anti-inflammatories should not be taken on a daily basis although it may help with some of her symptoms she could probably take a dose of ibuprofen once or twice a week.  We discussed the negative effects it may have on her GI system, kidneys as well as heart  Patient will take her tramadol as needed prescribed by her PCP at this time she does not require a refill.  I will see her back in approximately 6 months

## 2019-05-25 NOTE — Patient Instructions (Signed)
Please resume gabapentin 3 times per day

## 2019-06-07 ENCOUNTER — Other Ambulatory Visit: Payer: Self-pay | Admitting: Pulmonary Disease

## 2019-06-23 ENCOUNTER — Ambulatory Visit (INDEPENDENT_AMBULATORY_CARE_PROVIDER_SITE_OTHER): Payer: Medicare Other | Admitting: Internal Medicine

## 2019-06-23 ENCOUNTER — Other Ambulatory Visit: Payer: Self-pay

## 2019-06-23 ENCOUNTER — Encounter: Payer: Self-pay | Admitting: Internal Medicine

## 2019-06-23 VITALS — BP 132/66 | HR 73 | Temp 97.9°F | Ht 63.0 in | Wt 132.0 lb

## 2019-06-23 DIAGNOSIS — R739 Hyperglycemia, unspecified: Secondary | ICD-10-CM | POA: Diagnosis not present

## 2019-06-23 DIAGNOSIS — E785 Hyperlipidemia, unspecified: Secondary | ICD-10-CM | POA: Diagnosis not present

## 2019-06-23 DIAGNOSIS — N183 Chronic kidney disease, stage 3 unspecified: Secondary | ICD-10-CM | POA: Diagnosis not present

## 2019-06-23 DIAGNOSIS — I10 Essential (primary) hypertension: Secondary | ICD-10-CM

## 2019-06-23 LAB — CBC WITH DIFFERENTIAL/PLATELET
Basophils Absolute: 0.1 10*3/uL (ref 0.0–0.1)
Basophils Relative: 0.7 % (ref 0.0–3.0)
Eosinophils Absolute: 0.2 10*3/uL (ref 0.0–0.7)
Eosinophils Relative: 2.2 % (ref 0.0–5.0)
HCT: 45.5 % (ref 36.0–46.0)
Hemoglobin: 15 g/dL (ref 12.0–15.0)
Lymphocytes Relative: 12.8 % (ref 12.0–46.0)
Lymphs Abs: 1 10*3/uL (ref 0.7–4.0)
MCHC: 32.9 g/dL (ref 30.0–36.0)
MCV: 89.8 fl (ref 78.0–100.0)
Monocytes Absolute: 0.8 10*3/uL (ref 0.1–1.0)
Monocytes Relative: 10.9 % (ref 3.0–12.0)
Neutro Abs: 5.4 10*3/uL (ref 1.4–7.7)
Neutrophils Relative %: 73.4 % (ref 43.0–77.0)
Platelets: 387 10*3/uL (ref 150.0–400.0)
RBC: 5.06 Mil/uL (ref 3.87–5.11)
RDW: 14.9 % (ref 11.5–15.5)
WBC: 7.4 10*3/uL (ref 4.0–10.5)

## 2019-06-23 LAB — BASIC METABOLIC PANEL
BUN: 16 mg/dL (ref 6–23)
CO2: 27 mEq/L (ref 19–32)
Calcium: 9.2 mg/dL (ref 8.4–10.5)
Chloride: 98 mEq/L (ref 96–112)
Creatinine, Ser: 0.8 mg/dL (ref 0.40–1.20)
GFR: 67.19 mL/min (ref >60.00)
Glucose, Bld: 80 mg/dL (ref 70–99)
Potassium: 3.9 mEq/L (ref 3.5–5.1)
Sodium: 133 mEq/L — ABNORMAL LOW (ref 135–145)

## 2019-06-23 LAB — LIPID PANEL
Cholesterol: 191 mg/dL (ref 0–200)
HDL: 78.2 mg/dL (ref >39.00)
LDL Cholesterol: 86 mg/dL (ref 0–99)
NonHDL: 112.7
Total CHOL/HDL Ratio: 2
Triglycerides: 133 mg/dL (ref 0.0–149.0)
VLDL: 26.6 mg/dL (ref 0.0–40.0)

## 2019-06-23 LAB — HEPATIC FUNCTION PANEL
ALT: 17 U/L (ref 0–35)
AST: 21 U/L (ref 0–37)
Albumin: 4.3 g/dL (ref 3.5–5.2)
Alkaline Phosphatase: 86 U/L (ref 39–117)
Bilirubin, Direct: 0.1 mg/dL (ref 0.0–0.3)
Total Bilirubin: 0.5 mg/dL (ref 0.2–1.2)
Total Protein: 6.9 g/dL (ref 6.0–8.3)

## 2019-06-23 MED ORDER — TRAMADOL HCL 50 MG PO TABS: 50.0000 mg | ORAL_TABLET | Freq: Two times a day (BID) | ORAL | 2 refills | Status: DC | PRN

## 2019-06-23 NOTE — Assessment & Plan Note (Addendum)
stable overall by history and exam, recent data reviewed with pt, and pt to continue medical treatment as before,  to f/u any worsening symptoms or concerns  

## 2019-06-23 NOTE — Assessment & Plan Note (Signed)
>>  ASSESSMENT AND PLAN FOR HTN (HYPERTENSION) WRITTEN ON 06/23/2019  9:19 PM BY Corwin Levins, MD  stable overall by history and exam, recent data reviewed with pt, and pt to continue medical treatment as before,  to f/u any worsening symptoms or concerns

## 2019-06-23 NOTE — Patient Instructions (Signed)
Please continue all other medications as before, and refills have been done if requested.  Please have the pharmacy call with any other refills you may need.  Please continue your efforts at being more active, low cholesterol diet, and weight control.  You are otherwise up to date with prevention measures today.  Please keep your appointments with your specialists as you may have planned  Please go to the LAB at the blood drawing area for the tests to be done  You will be contacted by phone if any changes need to be made immediately.  Otherwise, you will receive a letter about your results with an explanation, but please check with MyChart first.  Please remember to sign up for MyChart if you have not done so, as this will be important to you in the future with finding out test results, communicating by private email, and scheduling acute appointments online when needed.  Please return in 6 months, or sooner if needed  

## 2019-06-23 NOTE — Assessment & Plan Note (Signed)
stable overall by history and exam, recent data reviewed with pt, and pt to continue medical treatment as before,  to f/u any worsening symptoms or concerns  

## 2019-06-23 NOTE — Progress Notes (Signed)
Subjective:    Patient ID: Alexandra Reyes, female    DOB: May 01, 1926, 86 y.o.   MRN: [id number]  HPI Here to f/u; overall doing ok,  Pt denies orthopnea, PND, increased LE swelling, palpitations, dizziness or syncope.  Pt denies new neurological symptoms such as new headache, or facial or extremity weakness or numbness.  Pt denies polydipsia, polyuria, or low sugar episode.  Pt states overall good compliance with meds, mostly trying to follow appropriate die.  Having chronic dyspnea and chest pain having seen pain management, and needs refill tramadol, does not abuse. Usually only takes 1 per day  Also seen per cards about 2 mo ago for possible angina per pt. July 2020 Did take ntg a couple of times but had low blood pressure and quit due to weakness, dizzy.  Ct chest reviewed with pt.  Placed on gabapentin at one point for left possible radicular pain to the anterior lower rib cage but could not tolerate even the 100 mg due to sedation.  Seeing Dr Sherilyn Cooter with vaginal prolapse now with pessary.  Also occasional constipation trying to avoid worsening rectal prolapse,   Past Medical History:  Diagnosis Date  . Age-related macular degeneration, wet, both eyes (HCC)   . Alcohol dependence in remission (HCC) 05/22/2011  . Allergic rhinitis, cause unspecified 05/22/2011  . Allergy   . Anemia, iron deficiency 05/18/2011  . Cholelithiasis   . COPD (chronic obstructive pulmonary disease) (HCC)   . DDD (degenerative disc disease), lumbar 05/18/2011  . Depression    "@ times" (09/19/2016)  . First degree AV block   . GERD (gastroesophageal reflux disease)   . Hiatal hernia   . History of blood transfusion    "3 or 4 related to childbirth"  . HTN (hypertension) 05/18/2011   pt denies this hx on 09/19/2016  . Hyperlipidemia 05/18/2011  . Macular degeneration   . Myocardial infarction (HCC)    "EKG shows I've had 2; date unknown" (09/19/2016)  . OA (osteoarthritis)    "a little here and there; mainly in  the back" (09/19/2016)  . Osteoporosis 05/18/2011  . PAT (paroxysmal atrial tachycardia) (HCC)   . Pectus excavatum 05/18/2011  . Personal history of colonic polyps 10/28/2012  . Pneumonia    "as a child"  . SVT (supraventricular tachycardia) (HCC)   . Urinary frequency    Past Surgical History:  Procedure Laterality Date  . ABDOMINAL HYSTERECTOMY  1988  . BREAST BIOPSY Right 1948   "it was ok"  . CARDIAC CATHETERIZATION    . CARDIOVASCULAR STRESS TEST  03/08/2009   EF 84%  . CATARACT EXTRACTION W/ INTRAOCULAR LENS  IMPLANT, BILATERAL Bilateral   . INGUINAL HERNIA REPAIR Right 06/23/2017   Procedure: REPAIR INCARCERATED  RIGHT FEMORAL HERNIA WITH MESH;  Surgeon: Harriette Bouillon, MD;  Location: [facility] OR;  Service: General;  Laterality: Right;  . KNEE ARTHROSCOPY Right   . RIGHT/LEFT HEART CATH AND CORONARY ANGIOGRAPHY N/A 07/25/2016   Procedure: Right/Left Heart Cath and Coronary Angiography;  Surgeon: Kathleene Hazel, MD;  Location: [facility] INVASIVE CV LAB;  Service: Cardiovascular;  Laterality: N/A;  . SVT ABLATION  09/19/2016  . SVT ABLATION N/A 09/19/2016   Procedure: SVT Ablation;  Surgeon: Hillis Range, MD;  Location: [facility] INVASIVE CV LAB;  Service: Cardiovascular;  Laterality: N/A;  . TONSILLECTOMY AND ADENOIDECTOMY    . US ECHOCARDIOGRAPHY  01/17/2003   EF 55-60%    reports that she quit smoking about 25 years ago. Her smoking  use included cigarettes. She started smoking about 60 years ago. She has a 54.00 pack-year smoking history. She has never used smokeless tobacco. She reports that she does not drink alcohol or use drugs. family history includes Arthritis in her mother; Heart disease in her father; Heart failure in her mother; Hypertension in her father; Kidney failure in her father. No Known Allergies Current Outpatient Medications on File Prior to Visit  Medication Sig Dispense Refill  . acetaminophen (TYLENOL) 500 MG tablet Take 1,000 mg by mouth 2 (two) times daily as needed  for moderate pain or headache.    . ALPRAZolam (XANAX) 0.25 MG tablet TAKE ONE TABLET BY MOUTH AT BEDTIME AS NEEDED FOR ANXIETY 90 tablet 1  . amLODipine (NORVASC) 5 MG tablet TAKE ONE TABLET BY MOUTH DAILY 90 tablet 2  . Ascorbic Acid (VITAMIN C PO) Take 1 tablet by mouth daily. Reported on 08/08/2015    . aspirin 81 MG tablet Take 81 mg by mouth at bedtime.     . B Complex Vitamins (VITAMIN B COMPLEX PO) Take 1 tablet by mouth daily.     . budesonide-formoterol (SYMBICORT) 80-4.5 MCG/ACT inhaler INHALE TWO PUFFS BY MOUTH TWICE A DAY. WAIT 20 MINUTES BETWEEN EACH PUFF 10.2 g 4  . calcium carbonate (OS-CAL) 600 MG tablet Take 600 mg by mouth daily.    Marland Kitchen CARTIA XT 120 MG 24 hr capsule TAKE ONE CAPSULE BY MOUTH DAILY 90 capsule 1  . diltiazem (CARDIZEM) 30 MG tablet Take 1 tablet (30 mg total) by mouth 4 (four) times daily as needed. 120 tablet 11  . docusate sodium (COLACE) 100 MG capsule Take 100 mg by mouth daily.     . furosemide (LASIX) 40 MG tablet TAKE ONE TABLET BY MOUTH DAILY 90 tablet 3  . gabapentin (NEURONTIN) 100 MG capsule Take 1 capsule (100 mg total) by mouth 3 (three) times daily. 90 capsule 11  . Glucosamine HCl-MSM (GLUCOSAMINE-MSM PO) Take 2 tablets by mouth daily.     . nitroGLYCERIN (NITROSTAT) 0.4 MG SL tablet Place 1 tablet (0.4 mg total) under the tongue every 5 (five) minutes as needed for chest pain. 25 tablet 3  . omeprazole (PRILOSEC) 40 MG capsule Take 1 capsule (40 mg total) by mouth daily. 90 capsule 2  . PRESCRIPTION MEDICATION Apply 1 Dose to eye every 3 (three) months. Eylea - eye injection    . Respiratory Therapy Supplies (FLUTTER) DEVI Use as directed 1 each 0  . simvastatin (ZOCOR) 10 MG tablet TAKE ONE TABLET BY MOUTH DAILY 90 tablet 0  . tobramycin (TOBREX) 0.3 % ophthalmic solution Place 1 drop into the right eye 4 (four) times daily. Use the day before, the day of, and the day after eye injections    . triamcinolone cream (KENALOG) 0.1 % Apply 1 application  topically daily as needed (hives). Apply to affected area 30 g 0  . vitamin E 200 UNIT capsule Take 200 Units by mouth daily.     No current facility-administered medications on file prior to visit.   Review of Systems  Constitutional: Negative for other unusual diaphoresis or sweats HENT: Negative for ear discharge or swelling Eyes: Negative for other worsening visual disturbances Respiratory: Negative for stridor or other swelling  Gastrointestinal: Negative for worsening distension or other blood Genitourinary: Negative for retention or other urinary change Musculoskeletal: Negative for other MSK pain or swelling Skin: Negative for color change or other new lesions Neurological: Negative for worsening tremors and other numbness  Psychiatric/Behavioral: Negative for worsening agitation or other fatigue All otherwise neg per pt     Objective:   Physical Exam BP 132/66 (BP Location: Left Arm, Patient Position: Sitting, Cuff Size: Normal)   Pulse 73   Temp 97.9 F (36.6 C) (Oral)   Ht 5\' 3"  (1.6 m)   Wt 132 lb (59.9 kg)   LMP  (LMP Unknown)   SpO2 97%   BMI 23.38 kg/m  VS noted,  Constitutional: Pt appears in NAD HENT: Head: NCAT.  Right Ear: External ear normal.  Left Ear: External ear normal.  Eyes: . Pupils are equal, round, and reactive to light. Conjunctivae and EOM are normal Nose: without d/c or deformity Neck: Neck supple. Gross normal ROM Cardiovascular: Normal rate and regular rhythm.   Pulmonary/Chest: Effort normal and breath sounds without rales or wheezing.  Abd:  Soft, NT, ND, + BS, no organomegaly Neurological: Pt is alert. At baseline orientation, motor grossly intact Skin: Skin is warm. No rashes, other new lesions, no LE edema Psychiatric: Pt behavior is normal without agitation  All otherwise neg per pt Lab Results  Component Value Date   WBC 8.7 06/17/2018   HGB 15.1 (H) 06/17/2018   HCT 45.1 06/17/2018   PLT 423.0 (H) 06/17/2018   GLUCOSE 86  06/17/2018   CHOL 174 06/17/2018   TRIG 105.0 06/17/2018   HDL 73.30 06/17/2018   LDLDIRECT 129.2 05/15/2011   LDLCALC 80 06/17/2018   ALT 15 06/17/2018   AST 18 06/17/2018   NA 134 (L) 06/17/2018   K 4.2 06/17/2018   CL 99 06/17/2018   CREATININE 0.74 06/17/2018   BUN 14 06/17/2018   CO2 27 06/17/2018   TSH 1.39 06/17/2018   INR 1.0 07/17/2016   HGBA1C 5.9 06/17/2018       Assessment & Plan:

## 2019-06-24 LAB — URINALYSIS, ROUTINE W REFLEX MICROSCOPIC
Bilirubin Urine: NEGATIVE
Hgb urine dipstick: NEGATIVE
Ketones, ur: NEGATIVE
Leukocytes,Ua: NEGATIVE
Nitrite: NEGATIVE
Specific Gravity, Urine: 1.015 (ref 1.000–1.030)
Total Protein, Urine: NEGATIVE
Urine Glucose: NEGATIVE
Urobilinogen, UA: 0.2 (ref 0.0–1.0)
pH: 6 (ref 5.0–8.0)

## 2019-06-24 LAB — HEMOGLOBIN A1C: Hgb A1c MFr Bld: 5.8 % (ref 4.6–6.5)

## 2019-06-30 DIAGNOSIS — H353232 Exudative age-related macular degeneration, bilateral, with inactive choroidal neovascularization: Secondary | ICD-10-CM | POA: Diagnosis not present

## 2019-06-30 DIAGNOSIS — H35371 Puckering of macula, right eye: Secondary | ICD-10-CM | POA: Diagnosis not present

## 2019-06-30 DIAGNOSIS — H3540 Unspecified peripheral retinal degeneration: Secondary | ICD-10-CM | POA: Diagnosis not present

## 2019-06-30 DIAGNOSIS — H43813 Vitreous degeneration, bilateral: Secondary | ICD-10-CM | POA: Diagnosis not present

## 2019-07-07 DIAGNOSIS — N816 Rectocele: Secondary | ICD-10-CM | POA: Diagnosis not present

## 2019-07-09 ENCOUNTER — Ambulatory Visit: Payer: Medicare Other

## 2019-07-09 DIAGNOSIS — R102 Pelvic and perineal pain: Secondary | ICD-10-CM | POA: Diagnosis not present

## 2019-07-09 DIAGNOSIS — N816 Rectocele: Secondary | ICD-10-CM | POA: Diagnosis not present

## 2019-07-10 ENCOUNTER — Other Ambulatory Visit: Payer: Self-pay | Admitting: Cardiology

## 2019-07-17 ENCOUNTER — Ambulatory Visit: Payer: Medicare Other | Attending: Internal Medicine

## 2019-07-17 DIAGNOSIS — Z23 Encounter for immunization: Secondary | ICD-10-CM | POA: Insufficient documentation

## 2019-07-17 NOTE — Progress Notes (Signed)
   Covid-19 Vaccination [facility]  Name:  Alexandra Reyes    MRN: [id number] DOB: [birth year]  07/17/2019  Alexandra Reyes was observed post Covid-19 immunization for 15 minutes without incidence. She was provided with Vaccine Information Sheet and instruction to access the V-Safe system.   Alexandra Reyes was instructed to call 911 with any severe reactions post vaccine: Marland Kitchen Difficulty breathing  . Swelling of your face and throat  . A fast heartbeat  . A bad rash all over your body  . Dizziness and weakness    Immunizations Administered    Name Date Dose VIS Date Route   Pfizer COVID-19 Vaccine 07/17/2019  3:33 PM 0.3 mL 05/21/2019 Intramuscular   Manufacturer: [employer], [employer]   Lot: [id number]   NDC: [address]-1

## 2019-08-04 ENCOUNTER — Other Ambulatory Visit: Payer: Self-pay

## 2019-08-04 ENCOUNTER — Ambulatory Visit (INDEPENDENT_AMBULATORY_CARE_PROVIDER_SITE_OTHER): Payer: Medicare Other | Admitting: Pulmonary Disease

## 2019-08-04 VITALS — BP 114/56 | HR 69 | Ht 63.0 in | Wt 132.6 lb

## 2019-08-04 DIAGNOSIS — R0789 Other chest pain: Secondary | ICD-10-CM | POA: Diagnosis not present

## 2019-08-04 DIAGNOSIS — R05 Cough: Secondary | ICD-10-CM

## 2019-08-04 DIAGNOSIS — J449 Chronic obstructive pulmonary disease, unspecified: Secondary | ICD-10-CM | POA: Diagnosis not present

## 2019-08-04 DIAGNOSIS — R058 Other specified cough: Secondary | ICD-10-CM

## 2019-08-04 DIAGNOSIS — J849 Interstitial pulmonary disease, unspecified: Secondary | ICD-10-CM | POA: Diagnosis not present

## 2019-08-04 DIAGNOSIS — J479 Bronchiectasis, uncomplicated: Secondary | ICD-10-CM

## 2019-08-04 DIAGNOSIS — Z7689 Persons encountering health services in other specified circumstances: Secondary | ICD-10-CM

## 2019-08-04 NOTE — Progress Notes (Signed)
Synopsis: Referred in February 2021 for bronchiectasis by Corwin Levins, MD  Subjective:   PATIENT ID: Alexandra Reyes GENDER: female DOB: Apr 20, 1926, MRN: [id number]  Chief Complaint  Patient presents with  . Follow-up    Pt states she still becomes SOB mainly with activities. Pt also has an occ cough with thick white phlegm.    This is a 86 year old female history of hypertension reflux COPD.  Followed in the pulmonary clinic for bronchiectasis last seen in the clinic November 2020 by [name], NP.  Patient is a former patient of Dr. Kendrick Fries.  Has had some chest pains treated with nitroglycerin history of coronary disease.  Currently compliant with use of flutter valve Symbicort plus Mucinex.  Latest pulmonary function test completed in March 2020 prior CT imaging of the chest in 2017, also CT imaging July 2020 bilateral lower lobe bronchiectasis..  Rheumatologic labs in 2017 were negative.  Doing okay with full airway clearance devices.  Breathing somewhat stable.  Occupation: Patient is from [place].  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  She came to [facility] in the late 1940s early 1950s as disaster relief from the [employer].  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  And routinely managed patients on iron lung.  She stated "the biggest problem where the doctors, they would come in on Sunday after church to visit and not wash their hands."  OV 08/04/2019: She has been doing ok with her breathing. She has been having pains in her chest but this is better now on gabapentin. She uses her flutter valve at least twice per days.  She has been using this and does feel like she brings up some sputum.  He does not always feel like she is able to clear her airways.  She has lived by herself for the past 25 years after the death of her husband from lung cancer.     Past Medical History:  Diagnosis Date  . Age-related macular  degeneration, wet, both eyes (HCC)   . Alcohol dependence in remission (HCC) 05/22/2011  . Allergic rhinitis, cause unspecified 05/22/2011  . Allergy   . Anemia, iron deficiency 05/18/2011  . Cholelithiasis   . COPD (chronic obstructive pulmonary disease) (HCC)   . DDD (degenerative disc disease), lumbar 05/18/2011  . Depression    "@ times" (09/19/2016)  . First degree AV block   . GERD (gastroesophageal reflux disease)   . Hiatal hernia   . History of blood transfusion    "3 or 4 related to childbirth"  . HTN (hypertension) 05/18/2011   pt denies this hx on 09/19/2016  . Hyperlipidemia 05/18/2011  . Macular degeneration   . Myocardial infarction (HCC)    "EKG shows I've had 2; date unknown" (09/19/2016)  . OA (osteoarthritis)    "a little here and there; mainly in the back" (09/19/2016)  . Osteoporosis 05/18/2011  . PAT (paroxysmal atrial tachycardia) (HCC)   . Pectus excavatum 05/18/2011  . Personal history of colonic polyps 10/28/2012  . Pneumonia    "as a child"  . SVT (supraventricular tachycardia) (HCC)   . Urinary frequency      Family History  Problem Relation Age of Onset  . Heart failure Mother   . Arthritis Mother   . Kidney failure Father   . Heart disease Father   . Hypertension Father      Past Surgical History:  Procedure Laterality Date  .  ABDOMINAL HYSTERECTOMY  1988  . BREAST BIOPSY Right 1948   "it was ok"  . CARDIAC CATHETERIZATION    . CARDIOVASCULAR STRESS TEST  03/08/2009   EF 84%  . CATARACT EXTRACTION W/ INTRAOCULAR LENS  IMPLANT, BILATERAL Bilateral   . INGUINAL HERNIA REPAIR Right 06/23/2017   Procedure: REPAIR INCARCERATED  RIGHT FEMORAL HERNIA WITH MESH;  Surgeon: Harriette Bouillon, MD;  Location: [facility] OR;  Service: General;  Laterality: Right;  . KNEE ARTHROSCOPY Right   . RIGHT/LEFT HEART CATH AND CORONARY ANGIOGRAPHY N/A 07/25/2016   Procedure: Right/Left Heart Cath and Coronary Angiography;  Surgeon: Kathleene Hazel, MD;  Location: [facility]  INVASIVE CV LAB;  Service: Cardiovascular;  Laterality: N/A;  . SVT ABLATION  09/19/2016  . SVT ABLATION N/A 09/19/2016   Procedure: SVT Ablation;  Surgeon: Hillis Range, MD;  Location: [facility] INVASIVE CV LAB;  Service: Cardiovascular;  Laterality: N/A;  . TONSILLECTOMY AND ADENOIDECTOMY    . US ECHOCARDIOGRAPHY  01/17/2003   EF 55-60%    Social History   Socioeconomic History  . Marital status: Widowed    Spouse name: Not on file  . Number of children: 2  . Years of education: 30  . Highest education level: Not on file  Occupational History  . Occupation: Retired Designer, jewellery  Tobacco Use  . Smoking status: Former Smoker    Packs/day: 1.50    Years: 36.00    Pack years: 54.00    Types: Cigarettes    Start date: 03/01/1959    Quit date: 06/10/1994    Years since quitting: 25.1  . Smokeless tobacco: Never Used  Substance and Sexual Activity  . Alcohol use: No  . Drug use: No  . Sexual activity: Never  Other Topics Concern  . Not on file  Social History Narrative   Originally from [place], [place]. She moved to [place] in 1950 during the polio epidemic. 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 Remote bird exposure. No mold or hot tub exposure.    Social Determinants of Health   Financial Resource Strain:   . Difficulty of Paying Living Expenses: Not on file  Food Insecurity:   . Worried About Programme researcher, broadcasting/film/video in the Last Year: Not on file  . Ran Out of Food in the Last Year: Not on file  Transportation Needs:   . Lack of Transportation (Medical): Not on file  . Lack of Transportation (Non-Medical): Not on file  Physical Activity:   . Days of Exercise per Week: Not on file  . Minutes of Exercise per Session: Not on file  Stress:   . Feeling of Stress : Not on file  Social Connections:   . Frequency of Communication with Friends and Family: Not on file  . Frequency of Social Gatherings with Friends and Family: Not on file  . Attends Religious Services: Not on file   . Active Member of Clubs or Organizations: Not on file  . Attends Banker Meetings: Not on file  . Marital Status: Not on file  Intimate Partner Violence:   . Fear of Current or Ex-Partner: Not on file  . Emotionally Abused: Not on file  . Physically Abused: Not on file  . Sexually Abused: Not on file     No Known Allergies   Outpatient Medications Prior to Visit  Medication Sig Dispense Refill  . acetaminophen (TYLENOL) 500 MG tablet Take 1,000 mg by mouth 2 (two) times daily as needed for moderate pain  or headache.    . ALPRAZolam (XANAX) 0.25 MG tablet TAKE ONE TABLET BY MOUTH AT BEDTIME AS NEEDED FOR ANXIETY 90 tablet 1  . amLODipine (NORVASC) 5 MG tablet TAKE ONE TABLET BY MOUTH DAILY 90 tablet 2  . Ascorbic Acid (VITAMIN C PO) Take 1 tablet by mouth daily. Reported on 08/08/2015    . aspirin 81 MG tablet Take 81 mg by mouth at bedtime.     . B Complex Vitamins (VITAMIN B COMPLEX PO) Take 1 tablet by mouth daily.     . budesonide-formoterol (SYMBICORT) 80-4.5 MCG/ACT inhaler INHALE TWO PUFFS BY MOUTH TWICE A DAY. WAIT 20 MINUTES BETWEEN EACH PUFF 10.2 g 4  . calcium carbonate (OS-CAL) 600 MG tablet Take 600 mg by mouth daily.    Marland Kitchen CARTIA XT 120 MG 24 hr capsule TAKE ONE CAPSULE BY MOUTH DAILY 90 capsule 1  . diltiazem (CARDIZEM) 30 MG tablet Take 1 tablet (30 mg total) by mouth 4 (four) times daily as needed. 120 tablet 11  . docusate sodium (COLACE) 100 MG capsule Take 100 mg by mouth daily.     . furosemide (LASIX) 40 MG tablet TAKE ONE TABLET BY MOUTH DAILY 90 tablet 3  . gabapentin (NEURONTIN) 100 MG capsule Take 1 capsule (100 mg total) by mouth 3 (three) times daily. 90 capsule 11  . Glucosamine HCl-MSM (GLUCOSAMINE-MSM PO) Take 2 tablets by mouth daily.     . nitroGLYCERIN (NITROSTAT) 0.4 MG SL tablet Place 1 tablet (0.4 mg total) under the tongue every 5 (five) minutes as needed for chest pain. 25 tablet 3  . omeprazole (PRILOSEC) 40 MG capsule Take 1 capsule  (40 mg total) by mouth daily. 90 capsule 2  . PRESCRIPTION MEDICATION Apply 1 Dose to eye every 3 (three) months. Eylea - eye injection    . Respiratory Therapy Supplies (FLUTTER) DEVI Use as directed 1 each 0  . simvastatin (ZOCOR) 10 MG tablet TAKE ONE TABLET BY MOUTH DAILY 90 tablet 3  . tobramycin (TOBREX) 0.3 % ophthalmic solution Place 1 drop into the right eye 4 (four) times daily. Use the day before, the day of, and the day after eye injections    . traMADol (ULTRAM) 50 MG tablet Take 1 tablet (50 mg total) by mouth 2 (two) times daily as needed. 60 tablet 2  . triamcinolone cream (KENALOG) 0.1 % Apply 1 application topically daily as needed (hives). Apply to affected area 30 g 0  . vitamin E 200 UNIT capsule Take 200 Units by mouth daily.     No facility-administered medications prior to visit.    Review of Systems  Constitutional: Negative for chills, fever, malaise/fatigue and weight loss.  HENT: Negative for hearing loss, sore throat and tinnitus.   Eyes: Negative for blurred vision and double vision.  Respiratory: Positive for cough and sputum production. Negative for hemoptysis, shortness of breath, wheezing and stridor.   Cardiovascular: Positive for chest pain. Negative for palpitations, orthopnea, leg swelling and PND.  Gastrointestinal: Negative for abdominal pain, constipation, diarrhea, heartburn, nausea and vomiting.  Genitourinary: Negative for dysuria, hematuria and urgency.  Musculoskeletal: Negative for joint pain and myalgias.  Skin: Negative for itching and rash.  Neurological: Negative for dizziness, tingling, weakness and headaches.  Endo/Heme/Allergies: Negative for environmental allergies. Does not bruise/bleed easily.  Psychiatric/Behavioral: Negative for depression. The patient is not nervous/anxious and does not have insomnia.   All other systems reviewed and are negative.    Objective:  Physical Exam Vitals reviewed.  Constitutional:      General:  She is not in acute distress.    Appearance: She is well-developed.  HENT:     Head: Normocephalic and atraumatic.  Eyes:     General: No scleral icterus.    Conjunctiva/sclera: Conjunctivae normal.     Pupils: Pupils are equal, round, and reactive to light.  Neck:     Vascular: No JVD.     Trachea: No tracheal deviation.  Cardiovascular:     Rate and Rhythm: Normal rate. Rhythm irregular.     Heart sounds: Normal heart sounds. No murmur.  Pulmonary:     Effort: Pulmonary effort is normal. No tachypnea, accessory muscle usage or respiratory distress.     Breath sounds: No stridor. No wheezing, rhonchi or rales.     Comments: Bilateral bronchial breath sounds, worse in the bases Abdominal:     General: Bowel sounds are normal.     Palpations: Abdomen is soft.  Musculoskeletal:        General: No tenderness.     Cervical back: Neck supple.  Lymphadenopathy:     Cervical: No cervical adenopathy.  Skin:    General: Skin is warm and dry.     Capillary Refill: Capillary refill takes less than 2 seconds.     Findings: No rash.  Neurological:     Mental Status: She is alert and oriented to person, place, and time.  Psychiatric:        Behavior: Behavior normal.      Vitals:   08/04/19 1338  BP: (!) 114/56  Pulse: 69  SpO2: 98%  Weight: 132 lb 9.6 oz (60.1 kg)  Height: 5\' 3"  (1.6 m)   98% on RA BMI Readings from Last 3 Encounters:  08/04/19 23.49 kg/m  06/23/19 23.38 kg/m  05/25/19 23.56 kg/m   Wt Readings from Last 3 Encounters:  08/04/19 132 lb 9.6 oz (60.1 kg)  06/23/19 132 lb (59.9 kg)  05/25/19 133 lb (60.3 kg)     CBC    Component Value Date/Time   WBC 7.4 06/23/2019 1426   RBC 5.06 06/23/2019 1426   HGB 15.0 06/23/2019 1426   HGB 13.9 07/17/2016 1029   HCT 45.5 06/23/2019 1426   HCT 41.7 07/17/2016 1029   PLT 387.0 06/23/2019 1426   PLT 387 (H) 07/17/2016 1029   MCV 89.8 06/23/2019 1426   MCV 89 07/17/2016 1029   MCH 30.0 06/25/2017 1034    MCHC 32.9 06/23/2019 1426   RDW 14.9 06/23/2019 1426   RDW 14.7 07/17/2016 1029   LYMPHSABS 1.0 06/23/2019 1426   LYMPHSABS 1.3 07/17/2016 1029   MONOABS 0.8 06/23/2019 1426   EOSABS 0.2 06/23/2019 1426   EOSABS 0.2 07/17/2016 1029   BASOSABS 0.1 06/23/2019 1426   BASOSABS 0.1 07/17/2016 1029    Chest Imaging: July 2020 CT chest bilateral lower lobe bronchiectasis.  Pulmonary Functions Testing Results: PFT Results Latest Ref Rng & Units 08/26/2018 07/12/2015 04/28/2013  FVC-Pre L 2.27 2.29 2.34  FVC-Predicted Pre % 112 106 103  FVC-Post L 2.29 2.39 2.33  FVC-Predicted Post % 113 110 103  Pre FEV1/FVC % % 75 68 69  Post FEV1/FCV % % 76 74 70  FEV1-Pre L 1.70 1.57 1.63  FEV1-Predicted Pre % 114 99 97  FEV1-Post L 1.74 1.76 1.63  DLCO UNC% % 71 52 55  DLCO COR %Predicted % 89 70 67  TLC L 4.86 - 4.51  TLC % Predicted % 99 - 92  RV %  Predicted % 107 - 76    Assessment & Plan:     ICD-10-CM   1. Bronchiectasis without complication ([place])  P29.5   2. Chronic obstructive pulmonary disease, unspecified COPD type ([place])  J44.9   3. ILD (interstitial lung disease) ([place])  J84.9   4. Chest wall pain  R07.89   5. Establishing care with new doctor, encounter for  Z76.89     Assessment:   This is a 86 year old female with chronic bronchiectasis, COPD, ILD.  She also has significant complaints of recurrent pain within the chest costochondritis.  She has had cough and sputum production for several years.  She has been maintained on flutter valve but has difficulty mobilizing secretions.  She lives alone unable to participate with CPT and unable to complete this by herself.  I believe she would be a good candidate for vest therapy.  If costochondritis pain continues to be worse and she has recurrent nervous pain in the same track may consider evaluation at some point in time by neuro interventional pain management, Dr. Clydell Hakim.  Plan Following Extensive Data Review & Interpretation:  .  I reviewed prior external note(s) from 05/25/2019 costochondritis, Dr. Providence Lanius physical med.  . I reviewed the result(s) of 1/88/4166 basic metabolic panel, CBC all within normal limits, 2017 echocardiogram preserved ejection fraction grade 1 diastolic dysfunction . I have ordered for the patient to continue Symbicort plus flutter valve to maintain airway clearance.  Vest therapy orders placed for DME supply.  Independent interpretation of tests . Review of patient's 12/28/2018 CT chest images revealed bilateral lower lobe bronchiectasis and interstitial thickening. The patient's images have been independently reviewed by me.     Current Outpatient Medications:  .  acetaminophen (TYLENOL) 500 MG tablet, Take 1,000 mg by mouth 2 (two) times daily as needed for moderate pain or headache., Disp: , Rfl:  .  ALPRAZolam (XANAX) 0.25 MG tablet, TAKE ONE TABLET BY MOUTH AT BEDTIME AS NEEDED FOR ANXIETY, Disp: 90 tablet, Rfl: 1 .  amLODipine (NORVASC) 5 MG tablet, TAKE ONE TABLET BY MOUTH DAILY, Disp: 90 tablet, Rfl: 2 .  Ascorbic Acid (VITAMIN C PO), Take 1 tablet by mouth daily. Reported on 08/08/2015, Disp: , Rfl:  .  aspirin 81 MG tablet, Take 81 mg by mouth at bedtime. , Disp: , Rfl:  .  B Complex Vitamins (VITAMIN B COMPLEX PO), Take 1 tablet by mouth daily. , Disp: , Rfl:  .  budesonide-formoterol (SYMBICORT) 80-4.5 MCG/ACT inhaler, INHALE TWO PUFFS BY MOUTH TWICE A DAY. WAIT 20 MINUTES BETWEEN EACH PUFF, Disp: 10.2 g, Rfl: 4 .  calcium carbonate (OS-CAL) 600 MG tablet, Take 600 mg by mouth daily., Disp: , Rfl:  .  CARTIA XT 120 MG 24 hr capsule, TAKE ONE CAPSULE BY MOUTH DAILY, Disp: 90 capsule, Rfl: 1 .  diltiazem (CARDIZEM) 30 MG tablet, Take 1 tablet (30 mg total) by mouth 4 (four) times daily as needed., Disp: 120 tablet, Rfl: 11 .  docusate sodium (COLACE) 100 MG capsule, Take 100 mg by mouth daily. , Disp: , Rfl:  .  furosemide (LASIX) 40 MG tablet, TAKE ONE TABLET BY MOUTH DAILY, Disp: 90  tablet, Rfl: 3 .  gabapentin (NEURONTIN) 100 MG capsule, Take 1 capsule (100 mg total) by mouth 3 (three) times daily., Disp: 90 capsule, Rfl: 11 .  Glucosamine HCl-MSM (GLUCOSAMINE-MSM PO), Take 2 tablets by mouth daily. , Disp: , Rfl:  .  nitroGLYCERIN (NITROSTAT) 0.4 MG SL tablet, Place 1  tablet (0.4 mg total) under the tongue every 5 (five) minutes as needed for chest pain., Disp: 25 tablet, Rfl: 3 .  omeprazole (PRILOSEC) 40 MG capsule, Take 1 capsule (40 mg total) by mouth daily., Disp: 90 capsule, Rfl: 2 .  PRESCRIPTION MEDICATION, Apply 1 Dose to eye every 3 (three) months. Eylea - eye injection, Disp: , Rfl:  .  Respiratory Therapy Supplies (FLUTTER) DEVI, Use as directed, Disp: 1 each, Rfl: 0 .  simvastatin (ZOCOR) 10 MG tablet, TAKE ONE TABLET BY MOUTH DAILY, Disp: 90 tablet, Rfl: 3 .  tobramycin (TOBREX) 0.3 % ophthalmic solution, Place 1 drop into the right eye 4 (four) times daily. Use the day before, the day of, and the day after eye injections, Disp: , Rfl:  .  traMADol (ULTRAM) 50 MG tablet, Take 1 tablet (50 mg total) by mouth 2 (two) times daily as needed., Disp: 60 tablet, Rfl: 2 .  triamcinolone cream (KENALOG) 0.1 %, Apply 1 application topically daily as needed (hives). Apply to affected area, Disp: 30 g, Rfl: 0 .  vitamin E 200 UNIT capsule, Take 200 Units by mouth daily., Disp: , Rfl:    Josephine Igo, DO [facility] 08/04/2019 1:43 PM

## 2019-08-04 NOTE — Patient Instructions (Addendum)
Thank you for visiting Dr. Tonia Brooms at [facility]. Today we recommend the following:  Orders for DME supply vest therapy, bronchiectasis  Continue flutter valve daily  Continue current inhaler regimen.   Return in about 6 months (around 02/01/2020) for w/ Dr. Tonia Brooms .    Please do your part to reduce the spread of COVID-19.

## 2019-08-11 ENCOUNTER — Ambulatory Visit: Payer: Medicare Other | Attending: Internal Medicine

## 2019-08-11 DIAGNOSIS — Z23 Encounter for immunization: Secondary | ICD-10-CM | POA: Insufficient documentation

## 2019-08-11 NOTE — Progress Notes (Signed)
   Covid-19 Vaccination Clinic  Name:  KEZIA BENEVIDES    MRN: [id number] DOB: [birth year]  08/11/2019  Ms. Kelch was observed post Covid-19 immunization for 15 minutes without incident. She was provided with Vaccine Information Sheet and instruction to access the V-Safe system.   Ms. Dimiceli was instructed to call 911 with any severe reactions post vaccine: Marland Kitchen Difficulty breathing  . Swelling of face and throat  . A fast heartbeat  . A bad rash all over body  . Dizziness and weakness   Immunizations Administered    Name Date Dose VIS Date Route   Pfizer COVID-19 Vaccine 08/11/2019  3:05 PM 0.3 mL 05/21/2019 Intramuscular   Manufacturer: [employer], [employer]   Lot: [id number]   NDC: [address]-7

## 2019-08-17 ENCOUNTER — Telehealth: Payer: Self-pay | Admitting: Pulmonary Disease

## 2019-08-17 NOTE — Telephone Encounter (Signed)
If able I would use the vest twice per day after using nebulized albuterol. She can use the flutter on/off throughout the day. Also, preferred to use at least 2 times per day.   Josephine Igo, DO [facility] 08/17/2019 6:21 PM

## 2019-08-17 NOTE — Telephone Encounter (Signed)
Called and spoke with pt. Pt wants to know how often she should be wearing the vest and how often should she be using the flutter valve. Dr. Tonia Brooms, please advise on this. Thanks!

## 2019-08-18 NOTE — Telephone Encounter (Signed)
I called and spoke with the patient and made her aware of Dr. Myrlene Broker recommendations. Nothing further is needed.

## 2019-09-14 ENCOUNTER — Other Ambulatory Visit: Payer: Self-pay | Admitting: Internal Medicine

## 2019-10-04 ENCOUNTER — Other Ambulatory Visit: Payer: Self-pay | Admitting: Internal Medicine

## 2019-10-06 DIAGNOSIS — N816 Rectocele: Secondary | ICD-10-CM | POA: Diagnosis not present

## 2019-11-02 ENCOUNTER — Other Ambulatory Visit: Payer: Self-pay | Admitting: Acute Care

## 2019-11-18 ENCOUNTER — Other Ambulatory Visit: Payer: Self-pay | Admitting: Physical Medicine & Rehabilitation

## 2019-11-22 ENCOUNTER — Ambulatory Visit: Payer: Medicare Other | Admitting: Internal Medicine

## 2019-11-23 ENCOUNTER — Encounter: Payer: Self-pay | Admitting: Physical Medicine & Rehabilitation

## 2019-11-23 ENCOUNTER — Other Ambulatory Visit: Payer: Self-pay

## 2019-11-23 ENCOUNTER — Encounter: Payer: Medicare Other | Attending: Physical Medicine & Rehabilitation | Admitting: Physical Medicine & Rehabilitation

## 2019-11-23 VITALS — BP 114/78 | HR 67 | Temp 98.3°F | Ht 63.0 in | Wt 131.2 lb

## 2019-11-23 DIAGNOSIS — G588 Other specified mononeuropathies: Secondary | ICD-10-CM

## 2019-11-23 DIAGNOSIS — M940 Chondrocostal junction syndrome [Tietze]: Secondary | ICD-10-CM

## 2019-11-23 DIAGNOSIS — M47816 Spondylosis without myelopathy or radiculopathy, lumbar region: Secondary | ICD-10-CM | POA: Insufficient documentation

## 2019-11-23 NOTE — Progress Notes (Signed)
Subjective:    Patient ID: Alexandra Reyes, female    DOB: [birth year], 86 y.o.   MRN: [id number]  HPI  86 yo female with hx of costochondritis, intercostal neuralgia as well as lumbar spondylosis Primary pain complaints today are low back pain as well as pain around the ribs. Being treated for bronchiectasis Difficulties with using flutter valve Afflo vest used to help mobilize secretions  Still takes tylenol and gabapentin 100mg  TID Tramadol, takes ~4pm and naps after this Get up and makes dinner after this   Pt gardening 90 min per day.   Pain Inventory Average Pain 8 Pain Right Now 0 My pain is dull  In the last 24 hours, has pain interfered with the following? General activity 0 Relation with others 0 Enjoyment of life 0 What TIME of day is your pain at its worst? daytime Sleep (in general) Good  Pain is worse with: bending Pain improves with: rest and medication Relief from Meds: 7  Mobility walk with assistance use a cane ability to climb steps?  yes do you drive?  yes  Function retired  Neuro/Psych No problems in this area  Prior Studies Any changes since last visit?  no  Physicians involved in your care Any changes since last visit?  no   Family History  Problem Relation Age of Onset  . Heart failure Mother   . Arthritis Mother   . Kidney failure Father   . Heart disease Father   . Hypertension Father    Social History   Socioeconomic History  . Marital status: Widowed    Spouse name: Not on file  . Number of children: 2  . Years of education: 56  . Highest education level: Not on file  Occupational History  . Occupation: Retired 4  Tobacco Use  . Smoking status: Former Smoker    Packs/day: 1.50    Years: 36.00    Pack years: 54.00    Types: Cigarettes    Start date: 03/01/1959    Quit date: 06/10/1994    Years since quitting: 25.4  . Smokeless tobacco: Never Used  Vaping Use  . Vaping Use: Never used  Substance  and Sexual Activity  . Alcohol use: No  . Drug use: No  . Sexual activity: Never  Other Topics Concern  . Not on file  Social History Narrative   Originally from [place], [place]. She moved to [place] in 1950 during the polio epidemic. 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 No pets currently. Remote bird exposure. No mold or hot tub exposure.    Social Determinants of Health   Financial Resource Strain:   . Difficulty of Paying Living Expenses:   Food Insecurity:   . Worried About Engineer, civil (consulting) in the Last Year:   . Programme researcher, broadcasting/film/video in the Last Year:   Transportation Needs:   . Barista (Medical):   Freight forwarder Lack of Transportation (Non-Medical):   Physical Activity:   . Days of Exercise per Week:   . Minutes of Exercise per Session:   Stress:   . Feeling of Stress :   Social Connections:   . Frequency of Communication with Friends and Family:   . Frequency of Social Gatherings with Friends and Family:   . Attends Religious Services:   . Active Member of Clubs or Organizations:   . Attends Marland Kitchen Meetings:   Banker Marital Status:    Past Surgical History:  Procedure Laterality Date  .  ABDOMINAL HYSTERECTOMY  1988  . BREAST BIOPSY Right 1948   "it was ok"  . CARDIAC CATHETERIZATION    . CARDIOVASCULAR STRESS TEST  03/08/2009   EF 84%  . CATARACT EXTRACTION W/ INTRAOCULAR LENS  IMPLANT, BILATERAL Bilateral   . INGUINAL HERNIA REPAIR Right 06/23/2017   Procedure: REPAIR INCARCERATED  RIGHT FEMORAL HERNIA WITH MESH;  Surgeon: Erroll Luna, MD;  Location: [facility];  Service: General;  Laterality: Right;  . KNEE ARTHROSCOPY Right   . RIGHT/LEFT HEART CATH AND CORONARY ANGIOGRAPHY N/A 07/25/2016   Procedure: Right/Left Heart Cath and Coronary Angiography;  Surgeon: Burnell Blanks, MD;  Location: [facility];  Service: Cardiovascular;  Laterality: N/A;  . SVT ABLATION  09/19/2016  . SVT ABLATION N/A 09/19/2016   Procedure: SVT Ablation;  Surgeon: Thompson Grayer,  MD;  Location: [facility];  Service: Cardiovascular;  Laterality: N/A;  . TONSILLECTOMY AND ADENOIDECTOMY    . US ECHOCARDIOGRAPHY  01/17/2003   EF 55-60%   Past Medical History:  Diagnosis Date  . Age-related macular degeneration, wet, both eyes ([place])   . Alcohol dependence in remission ([place]) 05/22/2011  . Allergic rhinitis, cause unspecified 05/22/2011  . Allergy   . Anemia, iron deficiency 05/18/2011  . Cholelithiasis   . COPD (chronic obstructive pulmonary disease) ([place])   . DDD (degenerative disc disease), lumbar 05/18/2011  . Depression    "@ times" (09/19/2016)  . First degree AV block   . GERD (gastroesophageal reflux disease)   . Hiatal hernia   . History of blood transfusion    "3 or 4 related to childbirth"  . HTN (hypertension) 05/18/2011   pt denies this hx on 09/19/2016  . Hyperlipidemia 05/18/2011  . Macular degeneration   . Myocardial infarction ([place])    "EKG shows I've had 2; date unknown" (09/19/2016)  . OA (osteoarthritis)    "a little here and there; mainly in the back" (09/19/2016)  . Osteoporosis 05/18/2011  . PAT (paroxysmal atrial tachycardia) ([place])   . Pectus excavatum 05/18/2011  . Personal history of colonic polyps 10/28/2012  . Pneumonia    "as a child"  . SVT (supraventricular tachycardia) ([place])   . Urinary frequency    BP 114/78   Pulse 67   Temp 98.3 F (36.8 C)   Ht 5\' 3"  (1.6 m)   Wt 131 lb 3.2 oz (59.5 kg)   LMP  (LMP Unknown)   SpO2 96%   BMI 23.24 kg/m   Opioid Risk Score:   Fall Risk Score:  `1  Depression screen PHQ 2/9  Depression screen [facility] 2/9 06/23/2019 08/10/2018 12/29/2017 11/10/2017 09/22/2017 08/26/2017 07/04/2017  Decreased Interest 0 0 0 0 0 0 0  Down, Depressed, Hopeless 0 0 1 0 0 0 1  PHQ - 2 Score 0 0 1 0 0 0 1  Altered sleeping - - 1 - - - -  Tired, decreased energy - - 1 - - - -  Change in appetite - - 1 - - - -  Feeling bad or failure about yourself  - - 0 - - - -  Trouble concentrating - - 0 - - - -  Moving  slowly or fidgety/restless - - 0 - - - -  Suicidal thoughts - - - - - - -  PHQ-9 Score - - 4 - - - -  Difficult doing work/chores - - Not difficult at all - - - -  Some recent data might be hidden  Review of Systems  All other systems reviewed and are negative.      Objective:   Physical Exam Vitals and nursing note reviewed.  HENT:     Head: Normocephalic and atraumatic.  Eyes:     Extraocular Movements: Extraocular movements intact.     Conjunctiva/sclera: Conjunctivae normal.     Pupils: Pupils are equal, round, and reactive to light.  Musculoskeletal:        General: Tenderness and deformity present. No swelling.     Thoracic back: Deformity present. No tenderness. Decreased range of motion. Scoliosis present.     Lumbar back: Deformity and tenderness present. No spasms. Decreased range of motion. Negative right straight leg raise test and negative left straight leg raise test. Scoliosis present.     Comments: Tenderness around the left 12th rib, mild to moderate There is a levoconvex scoliosis, mild  thoracolumbar spine  Skin:    General: Skin is warm and dry.  Neurological:     Mental Status: She is alert and oriented to person, place, and time.     Motor: Motor function is intact.  Psychiatric:        Mood and Affect: Mood normal.        Behavior: Behavior normal.        Thought Content: Thought content normal.    No sternal pain      Assessment & Plan:  #1.  History of costochondritis as well as intercostal neuralgia.  This appears to be well controlled on the gabapentin.  This is prescribed by this office and will continue with yearly monitoring.  2.  Lumbar spondylosis without myelopathy, well controlled with tramadol 50 mg/day, this is prescribed by primary care physician At this point does not appear to require any lumbar spine interventional procedures for pain

## 2019-12-01 ENCOUNTER — Ambulatory Visit (INDEPENDENT_AMBULATORY_CARE_PROVIDER_SITE_OTHER): Payer: Medicare Other | Admitting: Internal Medicine

## 2019-12-01 ENCOUNTER — Other Ambulatory Visit: Payer: Self-pay

## 2019-12-01 ENCOUNTER — Encounter: Payer: Self-pay | Admitting: Internal Medicine

## 2019-12-01 VITALS — BP 130/76 | HR 73 | Ht 63.0 in | Wt 131.4 lb

## 2019-12-01 DIAGNOSIS — I471 Supraventricular tachycardia: Secondary | ICD-10-CM | POA: Diagnosis not present

## 2019-12-01 DIAGNOSIS — I2510 Atherosclerotic heart disease of native coronary artery without angina pectoris: Secondary | ICD-10-CM | POA: Diagnosis not present

## 2019-12-01 DIAGNOSIS — M940 Chondrocostal junction syndrome [Tietze]: Secondary | ICD-10-CM

## 2019-12-01 DIAGNOSIS — I10 Essential (primary) hypertension: Secondary | ICD-10-CM | POA: Diagnosis not present

## 2019-12-01 DIAGNOSIS — R0602 Shortness of breath: Secondary | ICD-10-CM

## 2019-12-01 NOTE — Patient Instructions (Addendum)

## 2019-12-01 NOTE — Progress Notes (Signed)
PCP: Corwin Levins, MD Primary Cardiologist: Dr Anne Fu Primary EP: Dr Johney Frame  Alexandra Reyes is a 86 y.o. female who presents today for routine electrophysiology followup.  Since last being seen in our clinic, the patient reports doing very well.  Her atypical chest pain is stable.  SOB is stable.  No symptoms of SVT.  Today, she denies symptoms of palpitations,  lower extremity edema, dizziness, presyncope, or syncope.  The patient is otherwise without complaint today.   Past Medical History:  Diagnosis Date  . Age-related macular degeneration, wet, both eyes ([facility])   . Alcohol dependence in remission ([facility]) 05/22/2011  . Allergic rhinitis, cause unspecified 05/22/2011  . Allergy   . Anemia, iron deficiency 05/18/2011  . Cholelithiasis   . COPD (chronic obstructive pulmonary disease) ([facility])   . DDD (degenerative disc disease), lumbar 05/18/2011  . Depression    "@ times" (09/19/2016)  . First degree AV block   . GERD (gastroesophageal reflux disease)   . Hiatal hernia   . History of blood transfusion    "3 or 4 related to childbirth"  . HTN (hypertension) 05/18/2011   pt denies this hx on 09/19/2016  . Hyperlipidemia 05/18/2011  . Macular degeneration   . Myocardial infarction ([facility])    "EKG shows I've had 2; date unknown" (09/19/2016)  . OA (osteoarthritis)    "a little here and there; mainly in the back" (09/19/2016)  . Osteoporosis 05/18/2011  . PAT (paroxysmal atrial tachycardia) ([facility])   . Pectus excavatum 05/18/2011  . Personal history of colonic polyps 10/28/2012  . Pneumonia    "as a child"  . SVT (supraventricular tachycardia) ([facility])   . Urinary frequency    Past Surgical History:  Procedure Laterality Date  . ABDOMINAL HYSTERECTOMY  1988  . BREAST BIOPSY Right 1948   "it was ok"  . CARDIAC CATHETERIZATION    . CARDIOVASCULAR STRESS TEST  03/08/2009   EF 84%  . CATARACT EXTRACTION W/ INTRAOCULAR LENS  IMPLANT, BILATERAL Bilateral   . INGUINAL HERNIA REPAIR Right  06/23/2017   Procedure: REPAIR INCARCERATED  RIGHT FEMORAL HERNIA WITH MESH;  Surgeon: Harriette Bouillon, MD;  [facility];  Service: General;  Laterality: Right;  . KNEE ARTHROSCOPY Right   . RIGHT/LEFT HEART CATH AND CORONARY ANGIOGRAPHY N/A 07/25/2016   Procedure: Right/Left Heart Cath and Coronary Angiography;  Surgeon: Kathleene Hazel, MD;  Location: [facility];  Service: Cardiovascular;  Laterality: N/A;  . SVT ABLATION  09/19/2016  . SVT ABLATION N/A 09/19/2016   Procedure: SVT Ablation;  Surgeon: Hillis Range, MD;  Location: [facility] IN[facility];  Service: Cardiovascular;  Laterality: N/A;  . TONSILLECTOMY AND ADENOIDECTOMY    . US ECHOCARDIOGRAPHY  01/17/2003   EF 55-60%    ROS- all systems are reviewed and negatives except as per HPI above  Current Outpatient Medications  Medication Sig Dispense Refill  . acetaminophen (TYLENOL) 500 MG tablet Take 1,000 mg by mouth 2 (two) times daily as needed for moderate pain or headache.    . ALPRAZolam (XANAX) 0.25 MG tablet TAKE ONE TABLET BY MOUTH AT BEDTIME AS NEEDED FOR ANXIETY 90 tablet 1  . amLODipine (NORVASC) 5 MG tablet TAKE ONE TABLET BY MOUTH DAILY 90 tablet 2  . Ascorbic Acid (VITAMIN C PO) Take 1 tablet by mouth daily. Reported on 08/08/2015    . aspirin 81 MG tablet Take 81 mg by mouth at bedtime.     . B Complex Vitamins (VITAMIN  B COMPLEX PO) Take 1 tablet by mouth daily.     . budesonide-formoterol (SYMBICORT) 80-4.5 MCG/ACT inhaler INHALE TWO PUFFS BY MOUTH TWICE A DAY *WAIT 20 MINUTES BETWEEN EACH PUFF* 10.2 g 3  . calcium carbonate (OS-CAL) 600 MG tablet Take 600 mg by mouth daily.    Marland Kitchen diltiazem (CARDIZEM) 30 MG tablet Take 1 tablet (30 mg total) by mouth 4 (four) times daily as needed. 120 tablet 11  . diltiazem (CARTIA XT) 120 MG 24 hr capsule Take 1 capsule (120 mg total) by mouth daily. Please keep upcoming appt with Dr. Rayann Heman in June for future refills. Thank you 90 capsule 0  . docusate sodium (COLACE)  100 MG capsule Take 100 mg by mouth daily.     . furosemide (LASIX) 40 MG tablet TAKE ONE TABLET BY MOUTH DAILY 90 tablet 3  . gabapentin (NEURONTIN) 100 MG capsule TAKE ONE CAPSULE BY MOUTH THREE TIMES A DAY 90 capsule 10  . Glucosamine HCl-MSM (GLUCOSAMINE-MSM PO) Take 2 tablets by mouth daily.     . nitroGLYCERIN (NITROSTAT) 0.4 MG SL tablet Place 1 tablet (0.4 mg total) under the tongue every 5 (five) minutes as needed for chest pain. 25 tablet 3  . omeprazole (PRILOSEC) 40 MG capsule TAKE ONE CAPSULE BY MOUTH DAILY 90 capsule 1  . PRESCRIPTION MEDICATION Apply 1 Dose to eye every 3 (three) months. Eylea - eye injection    . Respiratory Therapy Supplies (FLUTTER) DEVI Use as directed 1 each 0  . simvastatin (ZOCOR) 10 MG tablet TAKE ONE TABLET BY MOUTH DAILY 90 tablet 3  . tobramycin (TOBREX) 0.3 % ophthalmic solution Place 1 drop into the right eye 4 (four) times daily. Use the day before, the day of, and the day after eye injections    . traMADol (ULTRAM) 50 MG tablet Take 1 tablet (50 mg total) by mouth 2 (two) times daily as needed. 60 tablet 2  . triamcinolone cream (KENALOG) 0.1 % Apply 1 application topically daily as needed (hives). Apply to affected area 30 g 0  . vitamin E 200 UNIT capsule Take 200 Units by mouth daily.     No current facility-administered medications for this visit.    Physical Exam: Vitals:   12/01/19 1211  BP: 130/76  Pulse: 73  SpO2: 98%  Weight: 131 lb 6.4 oz (59.6 kg)  Height: 5\' 3"  (1.6 m)    GEN- The patient is well appearing, alert and oriented x 3 today.   Head- normocephalic, atraumatic Eyes-  Sclera clear, conjunctiva pink Ears- hearing intact Oropharynx- clear Lungs- Clear to ausculation bilaterally, normal work of breathing Heart- Regular rate and rhythm, no murmurs, rubs or gallops, PMI not laterally displaced GI- soft, NT, ND, + BS Extremities- no clubbing, cyanosis, or edema  Wt Readings from Last 3 Encounters:  12/01/19 131 lb 6.4  oz (59.6 kg)  11/23/19 131 lb 3.2 oz (59.5 kg)  08/04/19 132 lb 9.6 oz (60.1 kg)    EKG tracing ordered today is personally reviewed and shows sinus with first degree AV block and incomplete LBBB  Assessment and Plan:  1. SVT S/p AVNRT ablation and doing well  2. Chronic chest pain Mostly due to costochondritis Follows with Dr Marlou Porch  3. HTN Stable No change required today  4. HL On simvastatin  5. SOB due to bronchiectasis Chronic  Follows with pulmonary  Risks, benefits and potential toxicities for medications prescribed and/or refilled reviewed with patient today.   Return in a year  to see EP APP  Hillis Range MD, [facility] 12/01/2019 12:21 PM

## 2019-12-14 ENCOUNTER — Other Ambulatory Visit: Payer: Self-pay | Admitting: Internal Medicine

## 2019-12-14 NOTE — Telephone Encounter (Signed)
Done erx 

## 2019-12-27 ENCOUNTER — Ambulatory Visit: Payer: Medicare Other | Admitting: Internal Medicine

## 2019-12-29 DIAGNOSIS — H353223 Exudative age-related macular degeneration, left eye, with inactive scar: Secondary | ICD-10-CM | POA: Diagnosis not present

## 2019-12-29 DIAGNOSIS — H43813 Vitreous degeneration, bilateral: Secondary | ICD-10-CM | POA: Diagnosis not present

## 2019-12-29 DIAGNOSIS — H35371 Puckering of macula, right eye: Secondary | ICD-10-CM | POA: Diagnosis not present

## 2019-12-29 DIAGNOSIS — H353212 Exudative age-related macular degeneration, right eye, with inactive choroidal neovascularization: Secondary | ICD-10-CM | POA: Diagnosis not present

## 2019-12-31 ENCOUNTER — Other Ambulatory Visit: Payer: Self-pay

## 2019-12-31 ENCOUNTER — Ambulatory Visit (INDEPENDENT_AMBULATORY_CARE_PROVIDER_SITE_OTHER): Payer: Medicare Other | Admitting: Internal Medicine

## 2019-12-31 ENCOUNTER — Ambulatory Visit (INDEPENDENT_AMBULATORY_CARE_PROVIDER_SITE_OTHER): Payer: Medicare Other

## 2019-12-31 ENCOUNTER — Encounter: Payer: Self-pay | Admitting: Internal Medicine

## 2019-12-31 VITALS — BP 118/70 | HR 72 | Temp 98.4°F | Resp 16 | Ht 63.0 in | Wt 130.4 lb

## 2019-12-31 VITALS — BP 118/70 | HR 72 | Temp 98.4°F | Ht 63.0 in | Wt 130.0 lb

## 2019-12-31 DIAGNOSIS — R739 Hyperglycemia, unspecified: Secondary | ICD-10-CM | POA: Diagnosis not present

## 2019-12-31 DIAGNOSIS — K219 Gastro-esophageal reflux disease without esophagitis: Secondary | ICD-10-CM

## 2019-12-31 DIAGNOSIS — N183 Chronic kidney disease, stage 3 unspecified: Secondary | ICD-10-CM

## 2019-12-31 DIAGNOSIS — I10 Essential (primary) hypertension: Secondary | ICD-10-CM

## 2019-12-31 DIAGNOSIS — E785 Hyperlipidemia, unspecified: Secondary | ICD-10-CM | POA: Diagnosis not present

## 2019-12-31 DIAGNOSIS — I2510 Atherosclerotic heart disease of native coronary artery without angina pectoris: Secondary | ICD-10-CM

## 2019-12-31 DIAGNOSIS — Z0000 Encounter for general adult medical examination without abnormal findings: Secondary | ICD-10-CM

## 2019-12-31 NOTE — Progress Notes (Signed)
Subjective:    Patient ID: Alexandra Reyes, female    DOB: 1925-08-02, 86 y.o.   MRN: [id number]  HPI  Here to f/u; overall doing ok,  Pt denies chest pain, increasing sob or doe, wheezing, orthopnea, PND, increased LE swelling, palpitations, dizziness or syncope.  Pt denies new neurological symptoms such as new headache, or facial or extremity weakness or numbness.  Pt denies polydipsia, polyuria, or low sugar episode.  Pt states overall good compliance with meds, Has recent dx bronchiectasis, now seeing new pulmonary.  Flutter valve working ok.  Denies worsening reflux, abd pain, dysphagia, n/v, bowel change or blood.  Did see her retinal specialist with macular degeneration overall stable.  Past Medical History:  Diagnosis Date  . Age-related macular degeneration, wet, both eyes ([facility])   . Alcohol dependence in remission ([facility]) 05/22/2011  . Allergic rhinitis, cause unspecified 05/22/2011  . Allergy   . Anemia, iron deficiency 05/18/2011  . Cholelithiasis   . COPD (chronic obstructive pulmonary disease) ([facility])   . DDD (degenerative disc disease), lumbar 05/18/2011  . Depression    "@ times" (09/19/2016)  . First degree AV block   . GERD (gastroesophageal reflux disease)   . Hiatal hernia   . History of blood transfusion    "3 or 4 related to childbirth"  . HTN (hypertension) 05/18/2011   pt denies this hx on 09/19/2016  . Hyperlipidemia 05/18/2011  . Macular degeneration   . Myocardial infarction ([facility])    "EKG shows I've had 2; date unknown" (09/19/2016)  . OA (osteoarthritis)    "a little here and there; mainly in the back" (09/19/2016)  . Osteoporosis 05/18/2011  . PAT (paroxysmal atrial tachycardia) ([facility])   . Pectus excavatum 05/18/2011  . Personal history of colonic polyps 10/28/2012  . Pneumonia    "as a child"  . SVT (supraventricular tachycardia) ([facility])   . Urinary frequency    Past Surgical History:  Procedure Laterality Date  . ABDOMINAL HYSTERECTOMY  1988  . BREAST BIOPSY  Right 1948   "it was ok"  . CARDIAC CATHETERIZATION    . CARDIOVASCULAR STRESS TEST  03/08/2009   EF 84%  . CATARACT EXTRACTION W/ INTRAOCULAR LENS  IMPLANT, BILATERAL Bilateral   . INGUINAL HERNIA REPAIR Right 06/23/2017   Procedure: REPAIR INCARCERATED  RIGHT FEMORAL HERNIA WITH MESH;  Surgeon: Harriette Bouillon, MD;  Location[facility];  Service: General;  Laterality: Right;  . KNEE ARTHROSCOPY Right   . RIGHT/LEFT HEART CATH AND CORONARY ANGIOGRAPHY N/A 07/25/2016   Procedure: Right/Left Heart Cath and Coronary Angiography;  Surgeon: Kathleene Hazel, MD;  Location: [facility] INVASIVE CV LAB;  Service: Cardiovascular;  Laterality: N/A;  . SVT ABLATION  09/19/2016  . SVT ABLATION N/A 09/19/2016   Procedure: SVT Ablation;  Surgeon: Hillis Range, MD;  Location: [facility] INVASIVE CV LAB;  Service: Cardiovascular;  Laterality: N/A;  . TONSILLECTOMY AND ADENOIDECTOMY    . US ECHOCARDIOGRAPHY  01/17/2003   EF 55-60%    reports that she quit smoking about 25 years ago. Her smoking use included cigarettes. She started smoking about 60 years ago. She has a 54.00 pack-year smoking history. She has never used smokeless tobacco. She reports that she does not drink alcohol and does not use drugs. family history includes Arthritis in her mother; Heart disease in her father; Heart failure in her mother; Hypertension in her father; Kidney failure in her father. No Known Allergies Current Outpatient Medications on File Prior to Visit  Medication Sig  Dispense Refill  . acetaminophen (TYLENOL) 500 MG tablet Take 1,000 mg by mouth 2 (two) times daily as needed for moderate pain or headache.    . ALPRAZolam (XANAX) 0.25 MG tablet TAKE ONE TABLET BY MOUTH EVERY NIGHT AT BEDTIME AS NEEDED FOR ANXIETY 90 tablet 1  . amLODipine (NORVASC) 5 MG tablet TAKE ONE TABLET BY MOUTH DAILY 90 tablet 2  . Ascorbic Acid (VITAMIN C PO) Take 1 tablet by mouth daily. Reported on 08/08/2015    . aspirin 81 MG tablet Take 81 mg by mouth at  bedtime.     . B Complex Vitamins (VITAMIN B COMPLEX PO) Take 1 tablet by mouth daily.     . budesonide-formoterol (SYMBICORT) 80-4.5 MCG/ACT inhaler INHALE TWO PUFFS BY MOUTH TWICE A DAY *WAIT 20 MINUTES BETWEEN EACH PUFF* 10.2 g 3  . calcium carbonate (OS-CAL) 600 MG tablet Take 600 mg by mouth daily.    Marland Kitchen CARTIA XT 120 MG 24 hr capsule TAKE ONE CAPSULE BY MOUTH DAILY 90 capsule 3  . diltiazem (CARDIZEM) 30 MG tablet Take 1 tablet (30 mg total) by mouth 4 (four) times daily as needed. 120 tablet 11  . docusate sodium (COLACE) 100 MG capsule Take 100 mg by mouth daily.     . furosemide (LASIX) 40 MG tablet TAKE ONE TABLET BY MOUTH DAILY 90 tablet 3  . gabapentin (NEURONTIN) 100 MG capsule TAKE ONE CAPSULE BY MOUTH THREE TIMES A DAY 90 capsule 10  . Glucosamine HCl-MSM (GLUCOSAMINE-MSM PO) Take 2 tablets by mouth daily.     . nitroGLYCERIN (NITROSTAT) 0.4 MG SL tablet Place 1 tablet (0.4 mg total) under the tongue every 5 (five) minutes as needed for chest pain. 25 tablet 3  . omeprazole (PRILOSEC) 40 MG capsule TAKE ONE CAPSULE BY MOUTH DAILY 90 capsule 1  . Respiratory Therapy Supplies (FLUTTER) DEVI Use as directed 1 each 0  . simvastatin (ZOCOR) 10 MG tablet TAKE ONE TABLET BY MOUTH DAILY 90 tablet 3  . traMADol (ULTRAM) 50 MG tablet Take 1 tablet (50 mg total) by mouth 2 (two) times daily as needed. 60 tablet 2  . triamcinolone cream (KENALOG) 0.1 % Apply 1 application topically daily as needed (hives). Apply to affected area 30 g 0  . vitamin E 200 UNIT capsule Take 200 Units by mouth daily.     No current facility-administered medications on file prior to visit.   Review of Systems All otherwise neg per pt    Objective:   Physical Exam BP 118/70 (BP Location: Left Arm, Patient Position: Sitting, Cuff Size: Large)   Pulse 72   Temp 98.4 F (36.9 C) (Oral)   Ht 5\' 3"  (1.6 m)   Wt 130 lb (59 kg)   LMP  (LMP Unknown)   SpO2 96%   BMI 23.03 kg/m  VS noted,  Constitutional: Pt  appears in NAD HENT: Head: NCAT.  Right Ear: External ear normal.  Left Ear: External ear normal.  Eyes: . Pupils are equal, round, and reactive to light. Conjunctivae and EOM are normal Nose: without d/c or deformity Neck: Neck supple. Gross normal ROM Cardiovascular: Normal rate and regular rhythm.   Pulmonary/Chest: Effort normal and breath sounds without rales or wheezing.  Abd:  Soft, NT, ND, + BS, no organomegaly Neurological: Pt is alert. At baseline orientation, motor grossly intact Skin: Skin is warm. No rashes, other new lesions, no LE edema Psychiatric: Pt behavior is normal without agitation  All otherwise neg per pt  Lab Results  Component Value Date   WBC 7.4 06/23/2019   HGB 15.0 06/23/2019   HCT 45.5 06/23/2019   PLT 387.0 06/23/2019   GLUCOSE 80 06/23/2019   CHOL 191 06/23/2019   TRIG 133.0 06/23/2019   HDL 78.20 06/23/2019   LDLDIRECT 129.2 05/15/2011   LDLCALC 86 06/23/2019   ALT 17 06/23/2019   AST 21 06/23/2019   NA 133 (L) 06/23/2019   K 3.9 06/23/2019   CL 98 06/23/2019   CREATININE 0.80 06/23/2019   BUN 16 06/23/2019   CO2 27 06/23/2019   TSH 1.39 06/17/2018   INR 1.0 07/17/2016   HGBA1C 5.8 06/23/2019      Assessment & Plan:

## 2019-12-31 NOTE — Patient Instructions (Signed)
Please continue all other medications as before, and refills have been done if requested. ° °Please have the pharmacy call with any other refills you may need. ° °Please continue your efforts at being more active, low cholesterol diet, and weight control. ° °You are otherwise up to date with prevention measures today. ° °Please keep your appointments with your specialists as you may have planned ° °Please make an Appointment to return in 6 months, or sooner if needed °

## 2019-12-31 NOTE — Progress Notes (Signed)
Subjective:   Alexandra Reyes is a 86 y.o. female who presents for Medicare Annual (Subsequent) preventive examination.  Review of Systems    No ROS. Medicare Wellness Visit Cardiac Risk Factors include: advanced age (>61men, >4 women);dyslipidemia;family history of premature cardiovascular disease     Objective:    Today's Vitals   12/31/19 1231  BP: 118/70  Pulse: 72  Resp: 16  Temp: 98.4 F (36.9 C)  SpO2: 96%  Weight: 130 lb 6.4 oz (59.1 kg)  Height: 5\' 3"  (1.6 m)  PainSc: 0-No pain   Body mass index is 23.1 kg/m.  Advanced Directives 12/31/2019 12/29/2017 06/24/2017 06/23/2017 12/25/2016 09/19/2016 09/19/2016  Does Patient Have a Medical Advance Directive? Yes Yes Yes Yes Yes Yes Yes  Type of 11/19/2016 of [place];Living will Healthcare Power of [place];Living will Healthcare Power of [place];Living will Healthcare Power of [place];Living will Healthcare Power of [place];Living will Healthcare Power of [place];Living will Healthcare Power of [place];Living will  Does patient want to make changes to medical advance directive? No - Patient declined - No - Patient declined - - No - Patient declined No - Patient declined  Copy of Healthcare Power of Attorney in Chart? No - copy requested No - copy requested - - No - copy requested No - copy requested No - copy requested    Current Medications (verified) Outpatient Encounter Medications as of 12/31/2019  Medication Sig   acetaminophen (TYLENOL) 500 MG tablet Take 1,000 mg by mouth 2 (two) times daily as needed for moderate pain or headache.   ALPRAZolam (XANAX) 0.25 MG tablet TAKE ONE TABLET BY MOUTH EVERY NIGHT AT BEDTIME AS NEEDED FOR ANXIETY   amLODipine (NORVASC) 5 MG tablet TAKE ONE TABLET BY MOUTH DAILY   Ascorbic Acid (VITAMIN C PO) Take 1 tablet by mouth daily. Reported on 08/08/2015   aspirin 81 MG tablet Take 81 mg by mouth at bedtime.    B Complex Vitamins (VITAMIN B COMPLEX PO)  Take 1 tablet by mouth daily.    budesonide-formoterol (SYMBICORT) 80-4.5 MCG/ACT inhaler INHALE TWO PUFFS BY MOUTH TWICE A DAY *WAIT 20 MINUTES BETWEEN EACH PUFF*   calcium carbonate (OS-CAL) 600 MG tablet Take 600 mg by mouth daily.   CARTIA XT 120 MG 24 hr capsule TAKE ONE CAPSULE BY MOUTH DAILY   diltiazem (CARDIZEM) 30 MG tablet Take 1 tablet (30 mg total) by mouth 4 (four) times daily as needed.   docusate sodium (COLACE) 100 MG capsule Take 100 mg by mouth daily.    furosemide (LASIX) 40 MG tablet TAKE ONE TABLET BY MOUTH DAILY   gabapentin (NEURONTIN) 100 MG capsule TAKE ONE CAPSULE BY MOUTH THREE TIMES A DAY   Glucosamine HCl-MSM (GLUCOSAMINE-MSM PO) Take 2 tablets by mouth daily.    nitroGLYCERIN (NITROSTAT) 0.4 MG SL tablet Place 1 tablet (0.4 mg total) under the tongue every 5 (five) minutes as needed for chest pain.   omeprazole (PRILOSEC) 40 MG capsule TAKE ONE CAPSULE BY MOUTH DAILY   Respiratory Therapy Supplies (FLUTTER) DEVI Use as directed   simvastatin (ZOCOR) 10 MG tablet TAKE ONE TABLET BY MOUTH DAILY   traMADol (ULTRAM) 50 MG tablet Take 1 tablet (50 mg total) by mouth 2 (two) times daily as needed.   triamcinolone cream (KENALOG) 0.1 % Apply 1 application topically daily as needed (hives). Apply to affected area   vitamin E 200 UNIT capsule Take 200 Units by mouth daily.   [DISCONTINUED] PRESCRIPTION MEDICATION Apply 1 Dose  to eye every 3 (three) months. Eylea - eye injection   [DISCONTINUED] tobramycin (TOBREX) 0.3 % ophthalmic solution Place 1 drop into the right eye 4 (four) times daily. Use the day before, the day of, and the day after eye injections   No facility-administered encounter medications on file as of 12/31/2019.    Allergies (verified) Patient has no known allergies.   History: Past Medical History:  Diagnosis Date   Age-related macular degeneration, wet, both eyes (HCC)    Alcohol dependence in remission (HCC) 05/22/2011    Allergic rhinitis, cause unspecified 05/22/2011   Allergy    Anemia, iron deficiency 05/18/2011   Cholelithiasis    COPD (chronic obstructive pulmonary disease) (HCC)    DDD (degenerative disc disease), lumbar 05/18/2011   Depression    "@ times" (09/19/2016)   First degree AV block    GERD (gastroesophageal reflux disease)    Hiatal hernia    History of blood transfusion    "3 or 4 related to childbirth"   HTN (hypertension) 05/18/2011   pt denies this hx on 09/19/2016   Hyperlipidemia 05/18/2011   Macular degeneration    Myocardial infarction (HCC)    "EKG shows I've had 2; date unknown" (09/19/2016)   OA (osteoarthritis)    "a little here and there; mainly in the back" (09/19/2016)   Osteoporosis 05/18/2011   PAT (paroxysmal atrial tachycardia) (HCC)    Pectus excavatum 05/18/2011   Personal history of colonic polyps 10/28/2012   Pneumonia    "as a child"   SVT (supraventricular tachycardia) (HCC)    Urinary frequency    Past Surgical History:  Procedure Laterality Date   ABDOMINAL HYSTERECTOMY  1988   BREAST BIOPSY Right 1948   "it was ok"   CARDIAC CATHETERIZATION     CARDIOVASCULAR STRESS TEST  03/08/2009   EF 84%   CATARACT EXTRACTION W/ INTRAOCULAR LENS  IMPLANT, BILATERAL Bilateral    INGUINAL HERNIA REPAIR Right 06/23/2017   Procedure: REPAIR INCARCERATED  RIGHT FEMORAL HERNIA WITH MESH;  Surgeon: Harriette Bouillonornett, Thomas, MD;  Location: [facility] OR;  Service: General;  Laterality: Right;   KNEE ARTHROSCOPY Right    RIGHT/LEFT HEART CATH AND CORONARY ANGIOGRAPHY N/A 07/25/2016   Procedure: Right/Left Heart Cath and Coronary Angiography;  Surgeon: Kathleene Hazelhristopher D McAlhany, MD;  [facility];  Service: Cardiovascular;  Laterality: N/A;   SVT ABLATION  09/19/2016   SVT ABLATION N/A 09/19/2016   Procedure: SVT Ablation;  Surgeon: Hillis RangeJames Allred, MD;  [facility];  Service: Cardiovascular;  Laterality: N/A;   TONSILLECTOMY AND  ADENOIDECTOMY     US ECHOCARDIOGRAPHY  01/17/2003   EF 55-60%   Family History  Problem Relation Age of Onset   Heart failure Mother    Arthritis Mother    Kidney failure Father    Heart disease Father    Hypertension Father    Social History   Socioeconomic History   Marital status: Widowed    Spouse name: Not on file   Number of children: 2   Years of education: 611   Highest education level: Not on file  Occupational History   Occupation: Retired Designer, jewelleryegistered Nurse  Tobacco Use   Smoking status: Former Smoker    Packs/day: 1.50    Years: 36.00    Pack years: 54.00    Types: Cigarettes    Start date: 03/01/1959    Quit date: 06/10/1994    Years since quitting: 25.5   Smokeless tobacco: Never Used  Vaping Use   Vaping Use: Never used  Substance and Sexual Activity   Alcohol use: No   Drug use: No   Sexual activity: Never  Other Topics Concern   Not on file  Social History Narrative   Originally from [place], [place]. She moved to [place] in 1950 during the polio epidemic. 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 Remote bird exposure. No mold or hot tub exposure.    Social Determinants of Health   Financial Resource Strain: Low Risk    Difficulty of Paying Living Expenses: Not hard at all  Food Insecurity: No Food Insecurity   Worried About Programme researcher, broadcasting/film/video in the Last Year: Never true   Ran Out of Food in the Last Year: Never true  Transportation Needs: No Transportation Needs   Lack of Transportation (Medical): No   Lack of Transportation (Non-Medical): No  Physical Activity:    Days of Exercise per Week:    Minutes of Exercise per Session:   Stress: No Stress Concern Present   Feeling of Stress : Not at all  Social Connections:    Frequency of Communication with Friends and Family:    Frequency of Social Gatherings with Friends and Family:    Attends Religious Services:    Active Member of Clubs or Organizations:    Attends  Engineer, structural:    Marital Status:     Tobacco Counseling Counseling given: No   Clinical Intake:  Pre-visit preparation completed: Yes  Pain : No/denies pain Pain Score: 0-No pain     BMI - recorded: 23.03 Nutritional Status: BMI of 19-24  Normal Nutritional Risks: None Diabetes: No  How often do you need to have someone help you when you read instructions, pamphlets, or other written materials from your doctor or pharmacy?: 1 - Never What is the last grade level you completed in school?: HSG  Diabetic? no  Interpreter Needed?: No  Information entered by :: Delbert Vu N. Noga Fogg, LPN   Activities of Daily Living In your present state of health, do you have any difficulty performing the following activities: 12/31/2019  Hearing? N  Vision? N  Difficulty concentrating or making decisions? N  Walking or climbing stairs? N  Dressing or bathing? N  Doing errands, shopping? N  Preparing Food and eating ? N  Using the Toilet? N  In the past six months, have you accidently leaked urine? Y  Do you have problems with loss of bowel control? Y  Managing your Medications? N  Managing your Finances? N  Housekeeping or managing your Housekeeping? N  Some recent data might be hidden    Patient Care Team: Corwin Levins, MD as PCP - General (Internal Medicine) Roslynn Amble, MD as Consulting Physician (Pulmonary Disease) Hillis Range, MD as Consulting Physician (Cardiology) Kathleene Hazel, MD as Consulting Physician (Cardiology) Evette Doffing, MD as Referring Physician (Orthopedic Surgery) Jake Bathe, MD as Consulting Physician (Cardiology) Hilarie Fredrickson, MD as Consulting Physician (Gastroenterology)  Indicate any recent Medical Services you may have received from other than [facility] providers in the past year (date may be approximate).     Assessment:   This is a routine wellness examination for [place].  Hearing/Vision  screen No exam data present  Dietary issues and exercise activities discussed: Current Exercise Habits: Home exercise routine, Type of exercise: walking (yard work), Time (Minutes): 30, Frequency (Times/Week): 5, Weekly Exercise (Minutes/Week): 150, Intensity: Moderate, Exercise limited by: None identified  Goals     [place]aint[place] current health status     Continue to be as active and independent as possible     Patient Stated     Stay as healthy and as independent as possible. Continue to be as active physically and socially.      Depression Screen PHQ 2/9 Scores 12/31/2019 06/23/2019 08/10/2018 12/29/2017 11/10/2017 09/22/2017 08/26/2017  PHQ - 2 Score 0 0 0 1 0 0 0  PHQ- 9 Score - - - 4 - - -    Fall Risk Fall Risk  12/31/2019 06/23/2019 05/25/2019 11/09/2018 08/10/2018  Falls in the past year? 0 0 0 0 0  Number falls in past yr: 0 - - - -  Injury with Fall? 0 - - - -  Risk for fall due to : No Fall Risks - - - -  Follow up Falls evaluation completed - - - -    Any stairs in or around the home? No  If so, are there any without handrails? No  Home free of loose throw rugs in walkways, pet beds, electrical cords, etc? Yes  Adequate lighting in your home to reduce risk of falls? Yes   ASSISTIVE DEVICES UTILIZED TO PREVENT FALLS:  Life alert? No  Use of a cane, walker or w/c? Yes  Grab bars in the bathroom? Yes  Shower chair or bench in shower? Yes  Elevated toilet seat or a handicapped toilet? No   TIMED UP AND GO:  Was the test performed? No .  Length of time to ambulate 10 feet: 0 sec.   Gait steady and fast without use of assistive device  Cognitive Function: MMSE - Mini Mental State Exam 12/29/2017 12/25/2016  Orientation to time 5 5  Orientation to Place 5 5  Registration 3 3  Attention/ Calculation 4 5  Recall 1 2  Language- name 2 objects 2 2  Language- repeat 1 1  Language- follow 3 step command 3 3  Language- read & follow direction 1 1  Write a sentence 1 1  Copy  design 1 1  Total score 27 29        Immunizations Immunization History  Administered Date(s) Administered   Influenza, High Dose Seasonal PF 05/08/2016, 03/10/2017, 04/06/2018   Influenza, Seasonal, Injecte, Preservative Fre 03/10/2013   Influenza,inj,quad, With Preservative 03/10/2017   Influenza-Unspecified 03/10/2014, 03/11/2015, 04/06/2018, 03/31/2019   PFIZER SARS-COV-2 Vaccination 07/17/2019, 08/11/2019   Pneumococcal Conjugate-13 05/10/2005, 06/30/2013   Pneumococcal Polysaccharide-23 06/10/1996   Tetanus 10/28/2012   Zoster 05/10/2008   Zoster Recombinat (Shingrix) 07/23/2018, 01/26/2019, 04/20/2019    TDAP status: Up to date Flu Vaccine status: Up to date Pneumococcal vaccine status: Up to date Covid-19 vaccine status: Completed vaccines  Qualifies for Shingles Vaccine? Yes   Zostavax completed Yes   Shingrix Completed?: Yes  Screening Tests Health Maintenance  Topic Date Due   INFLUENZA VACCINE  01/09/2020   TETANUS/TDAP  10/29/2022   DEXA SCAN  Completed   COVID-19 Vaccine  Completed   PNA vac Low Risk Adult  Completed    Health Maintenance  There are no preventive care reminders to display for this patient.  Colorectal cancer screening: No longer required.  Mammogram status: No longer required.  Bone Density status: Completed 11/10/2015. Results reflect: Bone density results: OSTEOPENIA. Repeat every 2-3 years.  Lung Cancer Screening: (Low Dose CT Chest recommended if Age 83-80 years, 30 pack-year currently smoking OR have quit w/in 15years.) does not qualify.   Lung Cancer Screening  Referral: no  Additional Screening:  Hepatitis C Screening: does not qualify; Completed no  Vision Screening: Recommended annual ophthalmology exams for early detection of glaucoma and other disorders of the eye. Is the patient up to date with their annual eye exam?  Yes  Who is the provider or what is the name of the office in which the patient attends  annual eye exams? Jason B. Allyne Gee, MD If pt is not established with a provider, would they like to be referred to a provider to establish care? No .   Dental Screening: Recommended annual dental exams for proper oral hygiene  Community Resource Referral / Chronic Care Management: CRR required this visit?  No   CCM required this visit?  No      Plan:     I have personally reviewed and noted the following in the patients chart:    Medical and social history  Use of alcohol, tobacco or illicit drugs   Current medications and supplements  Functional ability and status  Nutritional status  Physical activity  Advanced directives  List of other physicians  Hospitalizations, surgeries, and ER visits in previous 12 months  Vitals  Screenings to include cognitive, depression, and falls  Referrals and appointments  In addition, I have reviewed and discussed with patient certain preventive protocols, quality metrics, and best practice recommendations. A written personalized care plan for preventive services as well as general preventive health recommendations were provided to patient.     Mickeal Needy, LPN   [id number]   Nurse Notes: n/a

## 2019-12-31 NOTE — Patient Instructions (Addendum)
Alexandra Reyes , Thank you for taking time to come for your Medicare Wellness Visit. I appreciate your ongoing commitment to your health goals. Please review the following plan we discussed and let me know if I can assist you in the future.   Screening recommendations/referrals: Colonoscopy: no repeat due to age Mammogram: no repeat due to age Bone Density: 12/06/2015 Recommended yearly ophthalmology/optometry visit for glaucoma screening and checkup Recommended yearly dental visit for hygiene and checkup  Vaccinations: Influenza vaccine: 03/31/2019 Pneumococcal vaccine: completed Tdap vaccine: never done Shingles vaccine: completed   Covid-19: completed  Advanced directives: Please bring a copy of your health care power of attorney and living will to the office at your convenience.  Conditions/risks identified: Yes; Please continue to do your personal lifestyle choices by: daily care of teeth and gums, regular physical activity (goal should be 5 days a week for 30 minutes), eat a healthy diet, avoid tobacco and drug use, limiting any alcohol intake, taking a low-dose aspirin (if not allergic or have been advised by your provider otherwise) and taking vitamins and minerals as recommended by your provider. Continue doing brain stimulating activities (puzzles, reading, adult coloring books, staying active) to keep memory sharp. Continue to eat heart healthy diet (full of fruits, vegetables, whole grains, lean protein, water--limit salt, fat, and sugar intake) and increase physical activity as tolerated.  Next appointment: Please schedule your next Medicare Wellness Visit with your Nurse Health Advisor in 1 year.  Preventive Care 4 Years and Older, Female Preventive care refers to lifestyle choices and visits with your health care provider that can promote health and wellness. What does preventive care include?  A yearly physical exam. This is also called an annual well check.  Dental exams once  or twice a year.  Routine eye exams. Ask your health care provider how often you should have your eyes checked.  Personal lifestyle choices, including:  Daily care of your teeth and gums.  Regular physical activity.  Eating a healthy diet.  Avoiding tobacco and drug use.  Limiting alcohol use.  Practicing safe sex.  Taking low-dose aspirin every day.  Taking vitamin and mineral supplements as recommended by your health care provider. What happens during an annual well check? The services and screenings done by your health care provider during your annual well check will depend on your age, overall health, lifestyle risk factors, and family history of disease. Counseling  Your health care provider may ask you questions about your:  Alcohol use.  Tobacco use.  Drug use.  Emotional well-being.  Home and relationship well-being.  Sexual activity.  Eating habits.  History of falls.  Memory and ability to understand (cognition).  Work and work Astronomer.  Reproductive health. Screening  You may have the following tests or measurements:  Height, weight, and BMI.  Blood pressure.  Lipid and cholesterol levels. These may be checked every 5 years, or more frequently if you are over 5 years old.  Skin check.  Lung cancer screening. You may have this screening every year starting at age 22 if you have a 30-pack-year history of smoking and currently smoke or have quit within the past 15 years.  Fecal occult blood test (FOBT) of the stool. You may have this test every year starting at age 13.  Flexible sigmoidoscopy or colonoscopy. You may have a sigmoidoscopy every 5 years or a colonoscopy every 10 years starting at age 61.  Hepatitis C blood test.  Hepatitis B blood test.  Sexually  transmitted disease (STD) testing.  Diabetes screening. This is done by checking your blood sugar (glucose) after you have not eaten for a while (fasting). You may have this  done every 1-3 years.  Bone density scan. This is done to screen for osteoporosis. You may have this done starting at age 17.  Mammogram. This may be done every 1-2 years. Talk to your health care provider about how often you should have regular mammograms. Talk with your health care provider about your test results, treatment options, and if necessary, the need for more tests. Vaccines  Your health care provider may recommend certain vaccines, such as:  Influenza vaccine. This is recommended every year.  Tetanus, diphtheria, and acellular pertussis (Tdap, Td) vaccine. You may need a Td booster every 10 years.  Zoster vaccine. You may need this after age 82.  Pneumococcal 13-valent conjugate (PCV13) vaccine. One dose is recommended after age 28.  Pneumococcal polysaccharide (PPSV23) vaccine. One dose is recommended after age 74. Talk to your health care provider about which screenings and vaccines you need and how often you need them. This information is not intended to replace advice given to you by your health care provider. Make sure you discuss any questions you have with your health care provider. Document Released: 06/23/2015 Document Revised: 02/14/2016 Document Reviewed: 03/28/2015 Elsevier Interactive Patient Education  2017 [employer].  Fall Prevention in the Home Falls can cause injuries. They can happen to people of all ages. There are many things you can do to make your home safe and to help prevent falls. What can I do on the outside of my home?  Regularly fix the edges of walkways and driveways and fix any cracks.  Remove anything that might make you trip as you walk through a door, such as a raised step or threshold.  Trim any bushes or trees on the path to your home.  Use bright outdoor lighting.  Clear any walking paths of anything that might make someone trip, such as rocks or tools.  Regularly check to see if handrails are loose or broken. Make sure that  both sides of any steps have handrails.  Any raised decks and porches should have guardrails on the edges.  Have any leaves, snow, or ice cleared regularly.  Use sand or salt on walking paths during winter.  Clean up any spills in your garage right away. This includes oil or grease spills. What can I do in the bathroom?  Use night lights.  Install grab bars by the toilet and in the tub and shower. Do not use towel bars as grab bars.  Use non-skid mats or decals in the tub or shower.  If you need to sit down in the shower, use a plastic, non-slip stool.  Keep the floor dry. Clean up any water that spills on the floor as soon as it happens.  Remove soap buildup in the tub or shower regularly.  Attach bath mats securely with double-sided non-slip rug tape.  Do not have throw rugs and other things on the floor that can make you trip. What can I do in the bedroom?  Use night lights.  Make sure that you have a light by your bed that is easy to reach.  Do not use any sheets or blankets that are too big for your bed. They should not hang down onto the floor.  Have a firm chair that has side arms. You can use this for support while you get dressed.  Do not have throw rugs and other things on the floor that can make you trip. What can I do in the kitchen?  Clean up any spills right away.  Avoid walking on wet floors.  Keep items that you use a lot in easy-to-reach places.  If you need to reach something above you, use a strong step stool that has a grab bar.  Keep electrical cords out of the way.  Do not use floor polish or wax that makes floors slippery. If you must use wax, use non-skid floor wax.  Do not have throw rugs and other things on the floor that can make you trip. What can I do with my stairs?  Do not leave any items on the stairs.  Make sure that there are handrails on both sides of the stairs and use them. Fix handrails that are broken or loose. Make sure  that handrails are as long as the stairways.  Check any carpeting to make sure that it is firmly attached to the stairs. Fix any carpet that is loose or worn.  Avoid having throw rugs at the top or bottom of the stairs. If you do have throw rugs, attach them to the floor with carpet tape.  Make sure that you have a light switch at the top of the stairs and the bottom of the stairs. If you do not have them, ask someone to add them for you. What else can I do to help prevent falls?  Wear shoes that:  Do not have high heels.  Have rubber bottoms.  Are comfortable and fit you well.  Are closed at the toe. Do not wear sandals.  If you use a stepladder:  Make sure that it is fully opened. Do not climb a closed stepladder.  Make sure that both sides of the stepladder are locked into place.  Ask someone to hold it for you, if possible.  Clearly mark and make sure that you can see:  Any grab bars or handrails.  First and last steps.  Where the edge of each step is.  Use tools that help you move around (mobility aids) if they are needed. These include:  Canes.  Walkers.  Scooters.  Crutches.  Turn on the lights when you go into a dark area. Replace any light bulbs as soon as they burn out.  Set up your furniture so you have a clear path. Avoid moving your furniture around.  If any of your floors are uneven, fix them.  If there are any pets around you, be aware of where they are.  Review your medicines with your doctor. Some medicines can make you feel dizzy. This can increase your chance of falling. Ask your doctor what other things that you can do to help prevent falls. This information is not intended to replace advice given to you by your health care provider. Make sure you discuss any questions you have with your health care provider. Document Released: 03/23/2009 Document Revised: 11/02/2015 Document Reviewed: 07/01/2014 Elsevier Interactive Patient Education   2017 [employer].

## 2020-01-01 ENCOUNTER — Encounter: Payer: Self-pay | Admitting: Internal Medicine

## 2020-01-01 NOTE — Assessment & Plan Note (Signed)
>>  ASSESSMENT AND PLAN FOR HTN (HYPERTENSION) WRITTEN ON 01/01/2020 11:45 AM BY Corwin Levins, MD  stable overall by history and exam, recent data reviewed with pt, and pt to continue medical treatment as before,  to f/u any worsening symptoms or concerns

## 2020-01-01 NOTE — Assessment & Plan Note (Signed)
stable overall by history and exam, recent data reviewed with pt, and pt to continue medical treatment as before,  to f/u any worsening symptoms or concerns  

## 2020-01-01 NOTE — Assessment & Plan Note (Addendum)
stable overall by history and exam, recent data reviewed with pt, and pt to continue medical treatment as before,  to f/u any worsening symptoms or concerns  I spent 31 minutes in preparing to see the patient by review of recent labs, imaging and procedures, obtaining and reviewing separately obtained history, communicating with the patient and family or caregiver, ordering medications, tests or procedures, and documenting clinical information in the EHR including the differential Dx, treatment, and any further evaluation and other management of hyperglycemia, hld, htn, gerd, ckd

## 2020-01-04 ENCOUNTER — Other Ambulatory Visit: Payer: Self-pay | Admitting: Internal Medicine

## 2020-01-12 DIAGNOSIS — N816 Rectocele: Secondary | ICD-10-CM | POA: Diagnosis not present

## 2020-02-07 ENCOUNTER — Other Ambulatory Visit: Payer: Self-pay | Admitting: Internal Medicine

## 2020-02-08 NOTE — Telephone Encounter (Signed)
Done erx 

## 2020-02-09 ENCOUNTER — Encounter: Payer: Self-pay | Admitting: Primary Care

## 2020-02-09 ENCOUNTER — Ambulatory Visit (INDEPENDENT_AMBULATORY_CARE_PROVIDER_SITE_OTHER): Payer: Medicare Other | Admitting: Primary Care

## 2020-02-09 ENCOUNTER — Ambulatory Visit: Payer: Medicare Other | Admitting: Pulmonary Disease

## 2020-02-09 ENCOUNTER — Other Ambulatory Visit: Payer: Self-pay

## 2020-02-09 DIAGNOSIS — J479 Bronchiectasis, uncomplicated: Secondary | ICD-10-CM

## 2020-02-09 DIAGNOSIS — J449 Chronic obstructive pulmonary disease, unspecified: Secondary | ICD-10-CM

## 2020-02-09 NOTE — Progress Notes (Signed)
@Patient  ID: , female    DOB: 08-30-1925, 86 y.o.   MRN: [id number]  Chief Complaint  Patient presents with  . Follow-up    Referring provider: [id number], MD  HPI: 86 year old female, former smoker quit 1996 (54-pack-year history).  Past medical history significant for COPD, ILD, bronchiectasis, allergic rhinitis, hypertension, coronary artery disease, SVT, osteoarthritis, chronic kidney disease stage III.  Patient of Dr. 1997, last seen in office on 08/04/2019.  02/09/2020 Patient presents today for 6 month follow-up for bronchiectasis/COPD.  She is doing fairly well. She has been using therapy vest twice a day. She does not like using vest but does admit that it helps a great deal.  States that she is able to get up most of her mucus in the morning. Sputum is stringy white.  She continues to use Symbicort 2 puffs twice daily. She is wondering if she can stop using this. Advised she continue as she reports it helps with her breathing. She is able to talk on phone and practices reading out loud. She has question regarding whether or not her family from 04/10/2020 can visit her later this month d/t the pandemic. Both her family and herself have been vaccinated for covid-19.   Independent interpretation of tests . 12/28/2018 CT chest images revealed bilateral lower lobe bronchiectasis and interstitial thickening   No Known Allergies  Immunization History  Administered Date(s) Administered  . Influenza, High Dose Seasonal PF 05/08/2016, 03/10/2017, 04/06/2018  . Influenza, Seasonal, Injecte, Preservative Fre 03/10/2013  . Influenza,inj,quad, With Preservative 03/10/2017  . Influenza-Unspecified 03/10/2014, 03/11/2015, 04/06/2018, 03/31/2019  . PFIZER SARS-COV-2 Vaccination 07/17/2019, 08/11/2019  . Pneumococcal Conjugate-13 05/10/2005, 06/30/2013  . Pneumococcal Polysaccharide-23 06/10/1996  . Tetanus 10/28/2012  . Zoster 05/10/2008  . Zoster Recombinat (Shingrix)  07/23/2018, 01/26/2019, 04/20/2019    Past Medical History:  Diagnosis Date  . Age-related macular degeneration, wet, both eyes (HCC)   . Alcohol dependence in remission (HCC) 05/22/2011  . Allergic rhinitis, cause unspecified 05/22/2011  . Allergy   . Anemia, iron deficiency 05/18/2011  . Cholelithiasis   . COPD (chronic obstructive pulmonary disease) (HCC)   . DDD (degenerative disc disease), lumbar 05/18/2011  . Depression    "@ times" (09/19/2016)  . First degree AV block   . GERD (gastroesophageal reflux disease)   . Hiatal hernia   . History of blood transfusion    "3 or 4 related to childbirth"  . HTN (hypertension) 05/18/2011   pt denies this hx on 09/19/2016  . Hyperlipidemia 05/18/2011  . Macular degeneration   . Myocardial infarction (HCC)    "EKG shows I've had 2; date unknown" (09/19/2016)  . OA (osteoarthritis)    "a little here and there; mainly in the back" (09/19/2016)  . Osteoporosis 05/18/2011  . PAT (paroxysmal atrial tachycardia) (HCC)   . Pectus excavatum 05/18/2011  . Personal history of colonic polyps 10/28/2012  . Pneumonia    "as a child"  . SVT (supraventricular tachycardia) (HCC)   . Urinary frequency     Tobacco History: Social History   Tobacco Use  Smoking Status Former Smoker  . Packs/day: 1.50  . Years: 36.00  . Pack years: 54.00  . Types: Cigarettes  . Start date: 03/01/1959  . Quit date: 06/10/1994  . Years since quitting: 25.6  Smokeless Tobacco Never Used   Counseling given: Not Answered   Outpatient Medications Prior to Visit  Medication Sig Dispense Refill  . acetaminophen (TYLENOL) 500 MG tablet Take 1,000  mg by mouth 2 (two) times daily as needed for moderate pain or headache.    . ALPRAZolam (XANAX) 0.25 MG tablet TAKE ONE TABLET BY MOUTH EVERY NIGHT AT BEDTIME AS NEEDED FOR ANXIETY 90 tablet 1  . amLODipine (NORVASC) 5 MG tablet TAKE ONE TABLET BY MOUTH DAILY 90 tablet 1  . Ascorbic Acid (VITAMIN C PO) Take 1 tablet by mouth  daily. Reported on 08/08/2015    . aspirin 81 MG tablet Take 81 mg by mouth at bedtime.     . B Complex Vitamins (VITAMIN B COMPLEX PO) Take 1 tablet by mouth daily.     . budesonide-formoterol (SYMBICORT) 80-4.5 MCG/ACT inhaler INHALE TWO PUFFS BY MOUTH TWICE A DAY *WAIT 20 MINUTES BETWEEN EACH PUFF* 10.2 g 3  . calcium carbonate (OS-CAL) 600 MG tablet Take 600 mg by mouth daily.    Marland Kitchen CARTIA XT 120 MG 24 hr capsule TAKE ONE CAPSULE BY MOUTH DAILY 90 capsule 3  . diltiazem (CARDIZEM) 30 MG tablet Take 1 tablet (30 mg total) by mouth 4 (four) times daily as needed. 120 tablet 11  . docusate sodium (COLACE) 100 MG capsule Take 100 mg by mouth daily.     . furosemide (LASIX) 40 MG tablet TAKE ONE TABLET BY MOUTH DAILY 90 tablet 3  . gabapentin (NEURONTIN) 100 MG capsule TAKE ONE CAPSULE BY MOUTH THREE TIMES A DAY 90 capsule 10  . Glucosamine HCl-MSM (GLUCOSAMINE-MSM PO) Take 2 tablets by mouth daily.     . nitroGLYCERIN (NITROSTAT) 0.4 MG SL tablet Place 1 tablet (0.4 mg total) under the tongue every 5 (five) minutes as needed for chest pain. 25 tablet 3  . omeprazole (PRILOSEC) 40 MG capsule TAKE ONE CAPSULE BY MOUTH DAILY 90 capsule 1  . Respiratory Therapy Supplies (FLUTTER) DEVI Use as directed 1 each 0  . simvastatin (ZOCOR) 10 MG tablet TAKE ONE TABLET BY MOUTH DAILY 90 tablet 3  . traMADol (ULTRAM) 50 MG tablet TAKE ONE TABLET BY MOUTH TWICE A DAY AS NEEDED 60 tablet 2  . triamcinolone cream (KENALOG) 0.1 % Apply 1 application topically daily as needed (hives). Apply to affected area 30 g 0  . vitamin E 200 UNIT capsule Take 200 Units by mouth daily.     No facility-administered medications prior to visit.   Review of Systems  Review of Systems  Constitutional: Negative.   Respiratory: Positive for cough. Negative for chest tightness, shortness of breath and wheezing.   Cardiovascular: Negative.    Physical Exam  BP 118/64 (BP Location: Left Arm, Cuff Size: Normal)   Pulse 71    Temp 98.1 F (36.7 C) (Oral)   Ht 5\' 3"  (1.6 m)   Wt 129 lb 6.4 oz (58.7 kg)   LMP  (LMP Unknown)   SpO2 98%   BMI 22.92 kg/m  Physical Exam Constitutional:      Appearance: Normal appearance.  HENT:     Head: Normocephalic and atraumatic.     Mouth/Throat:     Mouth: Mucous membranes are moist.     Pharynx: Oropharynx is clear.  Cardiovascular:     Rate and Rhythm: Normal rate and regular rhythm.  Pulmonary:     Effort: Pulmonary effort is normal.     Breath sounds: Normal breath sounds.  Musculoskeletal:        General: Normal range of motion.  Skin:    General: Skin is warm and dry.  Neurological:     General: No focal deficit present.  Mental Status: She is alert and oriented to person, place, and time. Mental status is at baseline.  Psychiatric:        Mood and Affect: Mood normal.        Behavior: Behavior normal.        Thought Content: Thought content normal.        Judgment: Judgment normal.      Lab Results:  CBC    Component Value Date/Time   WBC 7.4 06/23/2019 1426   RBC 5.06 06/23/2019 1426   HGB 15.0 06/23/2019 1426   HGB 13.9 07/17/2016 1029   HCT 45.5 06/23/2019 1426   HCT 41.7 07/17/2016 1029   PLT 387.0 06/23/2019 1426   PLT 387 (H) 07/17/2016 1029   MCV 89.8 06/23/2019 1426   MCV 89 07/17/2016 1029   MCH 30.0 06/25/2017 1034   MCHC 32.9 06/23/2019 1426   RDW 14.9 06/23/2019 1426   RDW 14.7 07/17/2016 1029   LYMPHSABS 1.0 06/23/2019 1426   LYMPHSABS 1.3 07/17/2016 1029   MONOABS 0.8 06/23/2019 1426   EOSABS 0.2 06/23/2019 1426   EOSABS 0.2 07/17/2016 1029   BASOSABS 0.1 06/23/2019 1426   BASOSABS 0.1 07/17/2016 1029    BMET    Component Value Date/Time   NA 133 (L) 06/23/2019 1426   NA 136 07/17/2016 1029   K 3.9 06/23/2019 1426   CL 98 06/23/2019 1426   CO2 27 06/23/2019 1426   GLUCOSE 80 06/23/2019 1426   BUN 16 06/23/2019 1426   BUN 14 07/17/2016 1029   CREATININE 0.80 06/23/2019 1426   CREATININE 1.11 (H) 02/14/2016  1234   CALCIUM 9.2 06/23/2019 1426   GFRNONAA 50 (L) 06/25/2017 1034   GFRAA 58 (L) 06/25/2017 1034    BNP No results found for: BNP  ProBNP    Component Value Date/Time   PROBNP 104.0 (H) 02/07/2011 1125    Imaging: No results found.   Assessment & Plan:   Bronchiectasis without acute exacerbation (HCC) - Stable on current regimen; No acute exacerbation symptoms. She is compliant with therapy vest and reports significant improvement in cough and mucus productive with regular use. Plan continue Symbicort 1-2 puffs twice a day, therapy vest twice daily and flutter valve as needed. Advised patient that if she develops purulent colored mucus or fever to notify our office     Glenford Bayley, NP 02/09/2020

## 2020-02-09 NOTE — Assessment & Plan Note (Deleted)
-   Continue Symbicort 1-2 puffs twice a day (rinse mouth after use)

## 2020-02-09 NOTE — Patient Instructions (Addendum)
Recommendations: - Continue Symbicort 1-2 puffs twice a day (rinse mouth after use) - Continue use therapy vest twice daily - When not able to use vest use flutter valve as often as needed for chest congestion - If you develop purulent colored mucus or fever let us know   Ok to have family visit. Would recommend they get Rapid covid test at at local pharmacy/drug store before seeing you. Use standard precautions such as hand washing and masking to limit exposure.   Due for Flu vaccine and covid-19 boost in October   Follow-up: - 6 months with Dr. Tonia Brooms     Bronchiectasis  Bronchiectasis is a condition in which the airways in the lungs (bronchi) are damaged and widened. The condition makes it hard for the lungs to get rid of mucus, and it causes mucus to gather in the bronchi. This condition often leads to lung infections, which can make the condition worse. What are the causes? You can be born with this condition or you can develop it later in life. Common causes of this condition include:  Cystic fibrosis.  Repeated lung infections, such as pneumonia or tuberculosis.  An object or other blockage in the lungs.  Breathing in fluid, food, or other objects (aspiration).  A problem with the immune system and lung structure that is present at birth (congenital). Sometimes the cause is not known. What are the signs or symptoms? Common symptoms of this condition include:  A daily cough that brings up mucus and lasts for more than 3 weeks.  Lung infections that happen often.  Shortness of breath and wheezing.  Weakness and fatigue. How is this diagnosed? This condition is diagnosed with tests, such as:  Chest X-rays or CT scans. These are done to check for changes in the lungs.  Breathing tests. These are done to check how well your lungs are working.  A test of a sample of your saliva (sputum culture). This test is done to check for infection.  Blood tests and other tests.  These are done to check for related diseases or causes. How is this treated? Treatment for this condition depends on the severity of the illness and its cause. Treatment may include:  Medicines that loosen mucus so it can be coughed up (expectorants).  Medicines that relax the muscles of the bronchi (bronchodilators).  Antibiotic medicines to prevent or treat infection.  Physical therapy to help clear mucus from the lungs. Techniques may include: ? Postural drainage. This is when you sit or lie in certain positions so that mucus can drain by gravity. ? Chest percussion. This involves tapping the chest or back with a cupped hand. ? Chest vibration. For this therapy, a hand or special equipment vibrates your chest and back.  Surgery to remove the affected part of the lung. This may be done in severe cases. Follow these instructions at home: Medicines  Take over-the-counter and prescription medicines only as told by your health care provider.  If you were prescribed an antibiotic medicine, take it as told by your health care provider. Do not stop taking the antibiotic even if you start to feel better.  Avoid taking sedatives and antihistamines unless your health care provider tells you to take them. These medicines tend to thicken the mucus in the lungs. Managing symptoms  Perform breathing exercises or techniques to clear your lungs as told by your health care provider.  Consider using a cold steam vaporizer or humidifier in your room or home to help  loosen secretions.  If you have a cough that gets worse at night, try sleeping in a semi-upright position. General instructions  Get plenty of rest.  Drink enough fluid to keep your urine clear or pale yellow.  Stay inside when pollution and ozone levels are high.  Stay up to date with vaccinations and immunizations.  Avoid cigarette smoke and other lung irritants.  Do not use any products that contain nicotine or tobacco, such  as cigarettes and e-cigarettes. If you need help quitting, ask your health care provider.  Keep all follow-up visits as told by your health care provider. This is important. Contact a health care provider if:  You cough up more sputum than before and the sputum is yellow or green in color.  You have a fever.  You cannot control your cough and are losing sleep. Get help right away if:  You cough up blood.  You have chest pain.  You have increasing shortness of breath.  You have pain that gets worse or is not controlled with medicines.  You have a fever and your symptoms suddenly get worse. Summary  Bronchiectasis is a condition in which the airways in the lungs (bronchi) are damaged and widened. The condition makes it hard for the lungs to get rid of mucus, and it causes mucus to gather in the bronchi.  Treatment usually includes therapy to help clear mucus from the lungs.  Stay up to date with vaccinations and immunizations. This information is not intended to replace advice given to you by your health care provider. Make sure you discuss any questions you have with your health care provider. Document Revised: 05/09/2017 Document Reviewed: 07/01/2016 Elsevier Patient Education  2020 [employer].

## 2020-02-09 NOTE — Assessment & Plan Note (Addendum)
-   Stable on current regimen; No acute exacerbation symptoms. She is compliant with therapy vest and reports significant improvement in cough and mucus productive with regular use. Plan continue Symbicort 1-2 puffs twice a day, therapy vest twice daily and flutter valve as needed. Advised patient that if she develops purulent colored mucus or fever to notify our office

## 2020-03-09 ENCOUNTER — Other Ambulatory Visit: Payer: Self-pay | Admitting: *Deleted

## 2020-03-09 MED ORDER — BUDESONIDE-FORMOTEROL FUMARATE 80-4.5 MCG/ACT IN AERO: INHALATION_SPRAY | RESPIRATORY_TRACT | 5 refills | Status: DC

## 2020-03-20 DIAGNOSIS — Z23 Encounter for immunization: Secondary | ICD-10-CM | POA: Diagnosis not present

## 2020-03-29 DIAGNOSIS — H353212 Exudative age-related macular degeneration, right eye, with inactive choroidal neovascularization: Secondary | ICD-10-CM | POA: Diagnosis not present

## 2020-03-29 DIAGNOSIS — H353223 Exudative age-related macular degeneration, left eye, with inactive scar: Secondary | ICD-10-CM | POA: Diagnosis not present

## 2020-04-04 ENCOUNTER — Other Ambulatory Visit: Payer: Self-pay | Admitting: Internal Medicine

## 2020-04-04 DIAGNOSIS — Z23 Encounter for immunization: Secondary | ICD-10-CM | POA: Diagnosis not present

## 2020-04-12 DIAGNOSIS — N816 Rectocele: Secondary | ICD-10-CM | POA: Diagnosis not present

## 2020-05-02 ENCOUNTER — Ambulatory Visit (INDEPENDENT_AMBULATORY_CARE_PROVIDER_SITE_OTHER): Payer: Medicare Other | Admitting: Cardiology

## 2020-05-02 ENCOUNTER — Encounter: Payer: Self-pay | Admitting: Cardiology

## 2020-05-02 ENCOUNTER — Other Ambulatory Visit: Payer: Self-pay

## 2020-05-02 VITALS — BP 118/62 | HR 80 | Ht 63.0 in | Wt 127.0 lb

## 2020-05-02 DIAGNOSIS — I471 Supraventricular tachycardia: Secondary | ICD-10-CM

## 2020-05-02 DIAGNOSIS — E7800 Pure hypercholesterolemia, unspecified: Secondary | ICD-10-CM | POA: Diagnosis not present

## 2020-05-02 DIAGNOSIS — I2510 Atherosclerotic heart disease of native coronary artery without angina pectoris: Secondary | ICD-10-CM | POA: Diagnosis not present

## 2020-05-02 DIAGNOSIS — M940 Chondrocostal junction syndrome [Tietze]: Secondary | ICD-10-CM

## 2020-05-02 NOTE — Progress Notes (Signed)
Cardiology Office Note:    Date:  05/02/2020   ID:  Alexandra, Reyes [birth year], MRN [id number]  PCP:  Corwin Levins, MD  [facility] Cardiologist:  No primary care provider on file.  [facility] Electrophysiologist:  None   Referring MD: Corwin Levins, MD     History of Present Illness:    Alexandra Reyes is a 86 y.o. female here for follow up of SVT. Last saw Dr. Johney Frame on 12/01/2019, office note reviewed.  She was doing well at that time.  Chest pain was atypical and symptoms were stable.  No symptoms of palpitations.   Came to [place] from [place] to help treat the polio epidemic.  Went to [employer] high school in [place].  Overall has been doing quite well.  She likes to stay on the Cardizem.  When she came off of it at 1 point she started to have tachy arrhythmias again.  Past Medical History:  Diagnosis Date  . Age-related macular degeneration, wet, both eyes (HCC)   . Alcohol dependence in remission (HCC) 05/22/2011  . Allergic rhinitis, cause unspecified 05/22/2011  . Allergy   . Anemia, iron deficiency 05/18/2011  . Cholelithiasis   . COPD (chronic obstructive pulmonary disease) (HCC)   . DDD (degenerative disc disease), lumbar 05/18/2011  . Depression    "@ times" (09/19/2016)  . First degree AV block   . GERD (gastroesophageal reflux disease)   . Hiatal hernia   . History of blood transfusion    "3 or 4 related to childbirth"  . HTN (hypertension) 05/18/2011   pt denies this hx on 09/19/2016  . Hyperlipidemia 05/18/2011  . Macular degeneration   . Myocardial infarction (HCC)    "EKG shows I've had 2; date unknown" (09/19/2016)  . OA (osteoarthritis)    "a little here and there; mainly in the back" (09/19/2016)  . Osteoporosis 05/18/2011  . PAT (paroxysmal atrial tachycardia) (HCC)   . Pectus excavatum 05/18/2011  . Personal history of colonic polyps 10/28/2012  . Pneumonia    "as a child"  . SVT (supraventricular tachycardia) (HCC)    . Urinary frequency     Past Surgical History:  Procedure Laterality Date  . ABDOMINAL HYSTERECTOMY  1988  . BREAST BIOPSY Right 1948   "it was ok"  . CARDIAC CATHETERIZATION    . CARDIOVASCULAR STRESS TEST  03/08/2009   EF 84%  . CATARACT EXTRACTION W/ INTRAOCULAR LENS  IMPLANT, BILATERAL Bilateral   . INGUINAL HERNIA REPAIR Right 06/23/2017   Procedure: REPAIR INCARCERATED  RIGHT FEMORAL HERNIA WITH MESH;  Surgeon: Harriette Bouillon, MD;  Location: [facility] OR;  Service: General;  Laterality: Right;  . KNEE ARTHROSCOPY Right   . RIGHT/LEFT HEART CATH AND CORONARY ANGIOGRAPHY N/A 07/25/2016   Procedure: Right/Left Heart Cath and Coronary Angiography;  Surgeon: Kathleene Hazel, MD;  Location: [facility] INVASIVE CV LAB;  Service: Cardiovascular;  Laterality: N/A;  . SVT ABLATION  09/19/2016  . SVT ABLATION N/A 09/19/2016   Procedure: SVT Ablation;  Surgeon: Hillis Range, MD;  Location: [facility] INVASIVE CV LAB;  Service: Cardiovascular;  Laterality: N/A;  . TONSILLECTOMY AND ADENOIDECTOMY    . US ECHOCARDIOGRAPHY  01/17/2003   EF 55-60%    Current Medications: Current Meds  Medication Sig  . acetaminophen (TYLENOL) 500 MG tablet Take 1,000 mg by mouth 2 (two) times daily as needed for moderate pain or headache.  . ALPRAZolam (XANAX) 0.25 MG tablet TAKE ONE TABLET BY  MOUTH EVERY NIGHT AT BEDTIME AS NEEDED FOR ANXIETY  . amLODipine (NORVASC) 5 MG tablet TAKE ONE TABLET BY MOUTH DAILY  . Ascorbic Acid (VITAMIN C PO) Take 1 tablet by mouth daily. Reported on 08/08/2015  . aspirin 81 MG tablet Take 81 mg by mouth at bedtime.   . B Complex Vitamins (VITAMIN B COMPLEX PO) Take 1 tablet by mouth daily.   . budesonide-formoterol (SYMBICORT) 80-4.5 MCG/ACT inhaler INHALE TWO PUFFS BY MOUTH TWICE A DAY *WAIT 20 MINUTES BETWEEN EACH PUFF*  . calcium carbonate (OS-CAL) 600 MG tablet Take 600 mg by mouth daily.  Marland Kitchen CARTIA XT 120 MG 24 hr capsule TAKE ONE CAPSULE BY MOUTH DAILY  . diltiazem (CARDIZEM) 30 MG tablet  Take 1 tablet (30 mg total) by mouth 4 (four) times daily as needed.  . docusate sodium (COLACE) 100 MG capsule Take 100 mg by mouth daily.   . furosemide (LASIX) 40 MG tablet TAKE ONE TABLET BY MOUTH DAILY  . gabapentin (NEURONTIN) 100 MG capsule TAKE ONE CAPSULE BY MOUTH THREE TIMES A DAY  . Glucosamine HCl-MSM (GLUCOSAMINE-MSM PO) Take 2 tablets by mouth daily.   . nitroGLYCERIN (NITROSTAT) 0.4 MG SL tablet Place 1 tablet (0.4 mg total) under the tongue every 5 (five) minutes as needed for chest pain.  Marland Kitchen omeprazole (PRILOSEC) 40 MG capsule TAKE ONE CAPSULE BY MOUTH DAILY  . Respiratory Therapy Supplies (FLUTTER) DEVI Use as directed  . simvastatin (ZOCOR) 10 MG tablet TAKE ONE TABLET BY MOUTH DAILY  . traMADol (ULTRAM) 50 MG tablet TAKE ONE TABLET BY MOUTH TWICE A DAY AS NEEDED  . triamcinolone cream (KENALOG) 0.1 % Apply 1 application topically daily as needed (hives). Apply to affected area  . vitamin E 200 UNIT capsule Take 200 Units by mouth daily.     Allergies:   Patient has no known allergies.   Social History   Socioeconomic History  . Marital status: Widowed    Spouse name: Not on file  . Number of children: 2  . Years of education: 83  . Highest education level: Not on file  Occupational History  . Occupation: Retired Designer, jewellery  Tobacco Use  . Smoking status: Former Smoker    Packs/day: 1.50    Years: 36.00    Pack years: 54.00    Types: Cigarettes    Start date: 03/01/1959    Quit date: 06/10/1994    Years since quitting: 25.9  . Smokeless tobacco: Never Used  Vaping Use  . Vaping Use: Never used  Substance and Sexual Activity  . Alcohol use: No  . Drug use: No  . Sexual activity: Never  Other Topics Concern  . Not on file  Social History Narrative   Originally from [place], [place]. She moved to [place] in 1950 during the polio epidemic. 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 Remote bird exposure. No mold or hot tub exposure.    Social  Determinants of Health   Financial Resource Strain: Low Risk   . Difficulty of Paying Living Expenses: Not hard at all  Food Insecurity: No Food Insecurity  . Worried About Programme researcher, broadcasting/film/video in the Last Year: Never true  . Ran Out of Food in the Last Year: Never true  Transportation Needs: No Transportation Needs  . Lack of Transportation (Medical): No  . Lack of Transportation (Non-Medical): No  Physical Activity:   . Days of Exercise per Week: Not on file  . Minutes of Exercise per  Session: Not on file  Stress: No Stress Concern Present  . Feeling of Stress : Not at all  Social Connections:   . Frequency of Communication with Friends and Family: Not on file  . Frequency of Social Gatherings with Friends and Family: Not on file  . Attends Religious Services: Not on file  . Active Member of Clubs or Organizations: Not on file  . Attends BankerClub or Organization Meetings: Not on file  . Marital Status: Not on file     Family History: The patient's family history includes Arthritis in her mother; Heart disease in her father; Heart failure in her mother; Hypertension in her father; Kidney failure in her father.  ROS:   Please see the history of present illness.     All other systems reviewed and are negative.  EKGs/Labs/Other Studies Reviewed:    The following studies were reviewed today:  Cath 2018:  Prox RCA lesion, 100 %stenosed.  Prox LAD lesion, 20 %stenosed.  LV end diastolic pressure is normal.   1. 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 The mid and distal vessel fills briskly from left to right collaterals.  2. Mild non-obstructive disease in the LAD 3. Normal filling pressures.  4. Normal PA pressures  Diagnostic Dominance: Right  Intervention      Recent Labs: 06/23/2019: ALT 17; BUN 16; Creatinine, Ser 0.80; Hemoglobin 15.0; Platelets 387.0; Potassium 3.9; Sodium 133  Recent Lipid Panel    Component Value Date/Time   CHOL 191 06/23/2019 1426    CHOL 160 09/30/2016 1205   TRIG 133.0 06/23/2019 1426   HDL 78.20 06/23/2019 1426   HDL 73 09/30/2016 1205   CHOLHDL 2 06/23/2019 1426   VLDL 26.6 06/23/2019 1426   LDLCALC 86 06/23/2019 1426   LDLCALC 69 09/30/2016 1205   LDLDIRECT 129.2 05/15/2011 0925     Risk Assessment/Calculations:       Physical Exam:    VS:  BP 118/62   Pulse 80   Ht 5\' 3"  (1.6 m)   Wt 127 lb (57.6 kg)   LMP  (LMP Unknown)   SpO2 96%   BMI 22.50 kg/m     Wt Readings from Last 3 Encounters:  05/02/20 127 lb (57.6 kg)  02/09/20 129 lb 6.4 oz (58.7 kg)  12/31/19 130 lb (59 kg)     GEN:  Well nourished, well developed in no acute distress HEENT: Normal NECK: No JVD; No carotid bruits LYMPHATICS: No lymphadenopathy CARDIAC: RRR, no murmurs, rubs, gallops RESPIRATORY:  Clear to auscultation without rales, wheezing or rhonchi  ABDOMEN: Soft, non-tender, non-distended MUSCULOSKELETAL:  No edema; No deformity  SKIN: Warm and dry NEUROLOGIC:  Alert and oriented x 3 PSYCHIATRIC:  Normal affect   ASSESSMENT:    1. Coronary artery disease involving native coronary artery of native heart without angina pectoris   2. SVT (supraventricular tachycardia) (HCC)   3. Costochondritis   4. Pure hypercholesterolemia    PLAN:    In order of problems listed above:  PSVT -Status post AVNRT ablation, Dr. Delfina RedwoodAllred-doing well. Cardizem 120 CD. Works for you.  We can keep this going.  She had one episode of tachycardia when she stopped it.  Resumed it now feels better.  Chronic chest pain -Costochondritis at times.  Atypical.  Likely musculoskeletal.  Underneath lower ribs bilaterally.  Essential hypertension -Stable no medication changes made.  Hyperlipidemia -Continuing with simvastatin.  LDL 86.  Bronchiectasis -Has chronic shortness of breath.  Follows with pulmonary.  CAD -Previously  100% occluded RCA on cardiac catheterization.  Overall has been doing well.  Shared Decision Making/Informed  Consent        Medication Adjustments/Labs and Tests Ordered: Current medicines are reviewed at length with the patient today.  Concerns regarding medicines are outlined above.  No orders of the defined types were placed in this encounter.  No orders of the defined types were placed in this encounter.   Patient Instructions  Medication Instructions:  The current medical regimen is effective;  continue present plan and medications.  *If you need a refill on your cardiac medications before your next appointment, please call your pharmacy*  Follow-Up: At [facility], you and your health needs are our priority.  As part of our continuing mission to provide you with exceptional heart care, we have created designated Provider Care Teams.  These Care Teams include your primary Cardiologist (physician) and Advanced Practice Providers (APPs -  Physician Assistants and Nurse Practitioners) who all work together to provide you with the care you need, when you need it.  We recommend signing up for the patient portal called "MyChart".  Sign up information is provided on this After Visit Summary.  MyChart is used to connect with patients for Virtual Visits (Telemedicine).  Patients are able to view lab/test results, encounter notes, upcoming appointments, etc.  Non-urgent messages can be sent to your provider as well.   To learn more about what you can do with MyChart, go to [email or web address].    Your next appointment:   1 year  The format for your next appointment:   In Person  Provider:   Donato Schultz, MD  Thank you for choosing [facility]!!        Signed, Donato Schultz, MD  05/02/2020 1:45 PM    [facility]

## 2020-05-02 NOTE — Patient Instructions (Addendum)
Medication Instructions:  The current medical regimen is effective;  continue present plan and medications.  *If you need a refill on your cardiac medications before your next appointment, please call your pharmacy*  Follow-Up: At [facility], you and your health needs are our priority.  As part of our continuing mission to provide you with exceptional heart care, we have created designated Provider Care Teams.  These Care Teams include your primary Cardiologist (physician) and Advanced Practice Providers (APPs -  Physician Assistants and Nurse Practitioners) who all work together to provide you with the care you need, when you need it.  We recommend signing up for the patient portal called "MyChart".  Sign up information is provided on this After Visit Summary.  MyChart is used to connect with patients for Virtual Visits (Telemedicine).  Patients are able to view lab/test results, encounter notes, upcoming appointments, etc.  Non-urgent messages can be sent to your provider as well.   To learn more about what you can do with MyChart, go to [email or web address].    Your next appointment:   1 year  The format for your next appointment:   In Person  Provider:   Donato Schultz, MD  Thank you for choosing [facility]!!

## 2020-06-06 ENCOUNTER — Other Ambulatory Visit: Payer: Self-pay

## 2020-06-06 MED ORDER — FUROSEMIDE 40 MG PO TABS: ORAL_TABLET | ORAL | 3 refills | Status: DC

## 2020-06-28 ENCOUNTER — Other Ambulatory Visit: Payer: Self-pay | Admitting: Internal Medicine

## 2020-07-05 ENCOUNTER — Other Ambulatory Visit: Payer: Self-pay

## 2020-07-05 ENCOUNTER — Encounter: Payer: Self-pay | Admitting: Internal Medicine

## 2020-07-05 ENCOUNTER — Ambulatory Visit (INDEPENDENT_AMBULATORY_CARE_PROVIDER_SITE_OTHER): Payer: Medicare Other | Admitting: Internal Medicine

## 2020-07-05 VITALS — BP 140/70 | HR 64 | Temp 98.1°F | Ht 63.0 in | Wt 123.0 lb

## 2020-07-05 DIAGNOSIS — M545 Low back pain, unspecified: Secondary | ICD-10-CM

## 2020-07-05 DIAGNOSIS — E7849 Other hyperlipidemia: Secondary | ICD-10-CM

## 2020-07-05 DIAGNOSIS — R739 Hyperglycemia, unspecified: Secondary | ICD-10-CM | POA: Diagnosis not present

## 2020-07-05 DIAGNOSIS — E538 Deficiency of other specified B group vitamins: Secondary | ICD-10-CM | POA: Diagnosis not present

## 2020-07-05 DIAGNOSIS — I10 Essential (primary) hypertension: Secondary | ICD-10-CM | POA: Diagnosis not present

## 2020-07-05 DIAGNOSIS — N183 Chronic kidney disease, stage 3 unspecified: Secondary | ICD-10-CM | POA: Diagnosis not present

## 2020-07-05 DIAGNOSIS — K623 Rectal prolapse: Secondary | ICD-10-CM | POA: Diagnosis not present

## 2020-07-05 DIAGNOSIS — G8929 Other chronic pain: Secondary | ICD-10-CM | POA: Diagnosis not present

## 2020-07-05 LAB — LIPID PANEL
Cholesterol: 178 mg/dL (ref 0–200)
HDL: 75.6 mg/dL (ref >39.00)
LDL Cholesterol: 81 mg/dL (ref 0–99)
NonHDL: 102.23
Total CHOL/HDL Ratio: 2
Triglycerides: 108 mg/dL (ref 0.0–149.0)
VLDL: 21.6 mg/dL (ref 0.0–40.0)

## 2020-07-05 LAB — BASIC METABOLIC PANEL
BUN: 19 mg/dL (ref 6–23)
CO2: 28 mEq/L (ref 19–32)
Calcium: 9.4 mg/dL (ref 8.4–10.5)
Chloride: 96 mEq/L (ref 96–112)
Creatinine, Ser: 0.88 mg/dL (ref 0.40–1.20)
GFR: 57.08 mL/min — ABNORMAL LOW (ref >60.00)
Glucose, Bld: 77 mg/dL (ref 70–99)
Potassium: 4.1 mEq/L (ref 3.5–5.1)
Sodium: 132 mEq/L — ABNORMAL LOW (ref 135–145)

## 2020-07-05 LAB — HEPATIC FUNCTION PANEL
ALT: 16 U/L (ref 0–35)
AST: 20 U/L (ref 0–37)
Albumin: 4.3 g/dL (ref 3.5–5.2)
Alkaline Phosphatase: 81 U/L (ref 39–117)
Bilirubin, Direct: 0.1 mg/dL (ref 0.0–0.3)
Total Bilirubin: 0.5 mg/dL (ref 0.2–1.2)
Total Protein: 6.9 g/dL (ref 6.0–8.3)

## 2020-07-05 LAB — CBC WITH DIFFERENTIAL/PLATELET
Basophils Absolute: 0.1 10*3/uL (ref 0.0–0.1)
Basophils Relative: 0.8 % (ref 0.0–3.0)
Eosinophils Absolute: 0.1 10*3/uL (ref 0.0–0.7)
Eosinophils Relative: 1.5 % (ref 0.0–5.0)
HCT: 46.1 % — ABNORMAL HIGH (ref 36.0–46.0)
Hemoglobin: 15.2 g/dL — ABNORMAL HIGH (ref 12.0–15.0)
Lymphocytes Relative: 12.8 % (ref 12.0–46.0)
Lymphs Abs: 1.1 10*3/uL (ref 0.7–4.0)
MCHC: 32.9 g/dL (ref 30.0–36.0)
MCV: 89 fl (ref 78.0–100.0)
Monocytes Absolute: 1 10*3/uL (ref 0.1–1.0)
Monocytes Relative: 11.5 % (ref 3.0–12.0)
Neutro Abs: 6.3 10*3/uL (ref 1.4–7.7)
Neutrophils Relative %: 73.4 % (ref 43.0–77.0)
Platelets: 382 10*3/uL (ref 150.0–400.0)
RBC: 5.19 Mil/uL — ABNORMAL HIGH (ref 3.87–5.11)
RDW: 14.6 % (ref 11.5–15.5)
WBC: 8.6 10*3/uL (ref 4.0–10.5)

## 2020-07-05 LAB — VITAMIN D 25 HYDROXY (VIT D DEFICIENCY, FRACTURES): VITD: 48.32 ng/mL (ref 30.00–100.00)

## 2020-07-05 LAB — TSH: TSH: 1.67 u[IU]/mL (ref 0.35–4.50)

## 2020-07-05 LAB — VITAMIN B12: Vitamin B-12: 820 pg/mL (ref 211–911)

## 2020-07-05 LAB — HEMOGLOBIN A1C: Hgb A1c MFr Bld: 5.8 % (ref 4.6–6.5)

## 2020-07-05 NOTE — Progress Notes (Signed)
Established Patient Office Visit  Subjective:  Patient ID: Alexandra Reyes, female    DOB: 1925/09/10  Age: 87 y.o. MRN: [id number]      Chief Complaint: follow up HTN, HLD and hyperglycemia , ckd, rectal prolapse       HPI:  Alexandra Reyes is a 87 y.o. female here with worsening rectal prolapse x months with occasional leakage, not taking the metamucil every day but does help with bowel incontinence and control, also c/o 1 wk onset left LBP without obvious cause such as fall, twisting turning lifting or similar, denies fever, other worsening GI or GU symptoms, and worse to stand up from sitting, especially after sitting for some time. Has seen Dr Ethelene Halamos witwh left hip pain with cortisone recently, improved.  Has 1 wk right > left anterior leg pain, mod, sharp and dull, worse to stand up from sitting, no falls, has known bilat hip DJD per Dr Ethelene Halamos, has tramadol, walks with cane and has life alert.  Plans to f/u with Dr Ethelene Halamos.  Also has seperarate left lower back pain known to pain manageent who wanted to do cortisone per pt, but she has delcined so far  ALso has bilateral costal margin pain for several months mild intermittent sharp worse to bend forward.  Also seeing GYN for prolapse and has pessary management and changes routinely every 3 mo.  Also has rectal prolapse as well., leads to less control and irregular BM.  Has tried metamucil and is helping for now.      Wt Readings from Last 3 Encounters:  07/05/20 123 lb (55.8 kg)  05/02/20 127 lb (57.6 kg)  02/09/20 129 lb 6.4 oz (58.7 kg)   BP Readings from Last 3 Encounters:  07/05/20 140/70  05/02/20 118/62  02/09/20 118/64         Past Medical History:  Diagnosis Date  . Age-related macular degeneration, wet, both eyes (HCC)   . Alcohol dependence in remission (HCC) 05/22/2011  . Allergic rhinitis, cause unspecified 05/22/2011  . Allergy   . Anemia, iron deficiency 05/18/2011  . Cholelithiasis   . COPD (chronic obstructive  pulmonary disease) (HCC)   . DDD (degenerative disc disease), lumbar 05/18/2011  . Depression    "@ times" (09/19/2016)  . First degree AV block   . GERD (gastroesophageal reflux disease)   . Hiatal hernia   . History of blood transfusion    "3 or 4 related to childbirth"  . HTN (hypertension) 05/18/2011   pt denies this hx on 09/19/2016  . Hyperlipidemia 05/18/2011  . Macular degeneration   . Myocardial infarction (HCC)    "EKG shows I've had 2; date unknown" (09/19/2016)  . OA (osteoarthritis)    "a little here and there; mainly in the back" (09/19/2016)  . Osteoporosis 05/18/2011  . PAT (paroxysmal atrial tachycardia) (HCC)   . Pectus excavatum 05/18/2011  . Personal history of colonic polyps 10/28/2012  . Pneumonia    "as a child"  . SVT (supraventricular tachycardia) (HCC)   . Urinary frequency    Past Surgical History:  Procedure Laterality Date  . ABDOMINAL HYSTERECTOMY  1988  . BREAST BIOPSY Right 1948   "it was ok"  . CARDIAC CATHETERIZATION    . CARDIOVASCULAR STRESS TEST  03/08/2009   EF 84%  . CATARACT EXTRACTION W/ INTRAOCULAR LENS  IMPLANT, BILATERAL Bilateral   . INGUINAL HERNIA REPAIR Right 06/23/2017   Procedure: REPAIR INCARCERATED  RIGHT FEMORAL HERNIA WITH MESH;  Surgeon: Harriette Bouillonornett, Thomas,  MD;  Location: [facility] OR;  Service: General;  Laterality: Right;  . KNEE ARTHROSCOPY Right   . RIGHT/LEFT HEART CATH AND CORONARY ANGIOGRAPHY N/A 07/25/2016   Procedure: Right/Left Heart Cath and Coronary Angiography;  Surgeon: Kathleene Hazel, MD;  Location: [facility] INVASIVE CV LAB;  Service: Cardiovascular;  Laterality: N/A;  . SVT ABLATION  09/19/2016  . SVT ABLATION N/A 09/19/2016   Procedure: SVT Ablation;  Surgeon: Hillis Range, MD;  Location: [facility] INVASIVE CV LAB;  Service: Cardiovascular;  Laterality: N/A;  . TONSILLECTOMY AND ADENOIDECTOMY    . US ECHOCARDIOGRAPHY  01/17/2003   EF 55-60%    reports that she quit smoking about 26 years ago. Her smoking use included cigarettes.  She started smoking about 61 years ago. She has a 54.00 pack-year smoking history. She has never used smokeless tobacco. She reports that she does not drink alcohol and does not use drugs. family history includes Arthritis in her mother; Heart disease in her father; Heart failure in her mother; Hypertension in her father; Kidney failure in her father. No Known Allergies Current Outpatient Medications on File Prior to Visit  Medication Sig Dispense Refill  . acetaminophen (TYLENOL) 500 MG tablet Take 1,000 mg by mouth 2 (two) times daily as needed for moderate pain or headache.    . ALPRAZolam (XANAX) 0.25 MG tablet TAKE ONE TABLET BY MOUTH EVERY NIGHT AT BEDTIME AS NEEDED FOR ANXIETY 90 tablet 1  . amLODipine (NORVASC) 5 MG tablet TAKE ONE TABLET BY MOUTH DAILY 90 tablet 1  . Ascorbic Acid (VITAMIN C PO) Take 1 tablet by mouth daily. Reported on 08/08/2015    . aspirin 81 MG tablet Take 81 mg by mouth at bedtime.    . B Complex Vitamins (VITAMIN B COMPLEX PO) Take 1 tablet by mouth daily.    . budesonide-formoterol (SYMBICORT) 80-4.5 MCG/ACT inhaler INHALE TWO PUFFS BY MOUTH TWICE A DAY *WAIT 20 MINUTES BETWEEN EACH PUFF* 10.2 g 5  . calcium carbonate (OS-CAL) 600 MG tablet Take 600 mg by mouth daily.    Marland Kitchen CARTIA XT 120 MG 24 hr capsule TAKE ONE CAPSULE BY MOUTH DAILY 90 capsule 3  . diltiazem (CARDIZEM) 30 MG tablet Take 1 tablet (30 mg total) by mouth 4 (four) times daily as needed. 120 tablet 11  . docusate sodium (COLACE) 100 MG capsule Take 100 mg by mouth daily.    . furosemide (LASIX) 40 MG tablet TAKE ONE TABLET BY MOUTH DAILY 90 tablet 3  . gabapentin (NEURONTIN) 100 MG capsule TAKE ONE CAPSULE BY MOUTH THREE TIMES A DAY 90 capsule 10  . Glucosamine HCl-MSM (GLUCOSAMINE-MSM PO) Take 2 tablets by mouth daily.     . nitroGLYCERIN (NITROSTAT) 0.4 MG SL tablet Place 1 tablet (0.4 mg total) under the tongue every 5 (five) minutes as needed for chest pain. 25 tablet 3  . omeprazole (PRILOSEC)  40 MG capsule TAKE ONE CAPSULE BY MOUTH DAILY 90 capsule 1  . Respiratory Therapy Supplies (FLUTTER) DEVI Use as directed 1 each 0  . simvastatin (ZOCOR) 10 MG tablet TAKE ONE TABLET BY MOUTH DAILY 90 tablet 3  . traMADol (ULTRAM) 50 MG tablet TAKE ONE TABLET BY MOUTH TWICE A DAY AS NEEDED 60 tablet 2  . triamcinolone cream (KENALOG) 0.1 % Apply 1 application topically daily as needed (hives). Apply to affected area 30 g 0  . vitamin E 200 UNIT capsule Take 200 Units by mouth daily.     No current facility-administered medications on file  prior to visit.        ROS:  All others reviewed and negative.  Objective        PE:  BP 140/70   Pulse 64   Temp 98.1 F (36.7 C) (Temporal)   Ht 5\' 3"  (1.6 m)   Wt 123 lb (55.8 kg)   LMP  (LMP Unknown)   SpO2 98%   BMI 21.79 kg/m                 Constitutional: Pt appears in NAD               HENT: Head: NCAT.                Right Ear: External ear normal.                 Left Ear: External ear normal.                Eyes: . Pupils are equal, round, and reactive to light. Conjunctivae and EOM are normal               Nose: without d/c or deformity               Neck: Neck supple. Gross normal ROM               Cardiovascular: Normal rate and regular rhythm.                 Pulmonary/Chest: Effort normal and breath sounds without rales or wheezing.                Abd:  Soft, NT, ND, + BS, no organomegaly               LBP  - has mild tender left paravertebral               Neurological: Pt is alert. At baseline orientation, motor grossly intact               Skin: Skin is warm. No rashes, no other new lesions, LE edema - none               Psychiatric: Pt behavior is normal without agitation   Assessment/Plan:  TERIYAH PURINGTON is a 87 y.o. White or Caucasian [1] female with  has a past medical history of Age-related macular degeneration, wet, both eyes (HCC), Alcohol dependence in remission (HCC) (05/22/2011), Allergic rhinitis, cause  unspecified (05/22/2011), Allergy, Anemia, iron deficiency (05/18/2011), Cholelithiasis, COPD (chronic obstructive pulmonary disease) (HCC), DDD (degenerative disc disease), lumbar (05/18/2011), Depression, First degree AV block, GERD (gastroesophageal reflux disease), Hiatal hernia, History of blood transfusion, HTN (hypertension) (05/18/2011), Hyperlipidemia (05/18/2011), Macular degeneration, Myocardial infarction (HCC), OA (osteoarthritis), Osteoporosis (05/18/2011), PAT (paroxysmal atrial tachycardia) (HCC), Pectus excavatum (05/18/2011), Personal history of colonic polyps (10/28/2012), Pneumonia, SVT (supraventricular tachycardia) (HCC), and Urinary frequency.   Micro: none  Cardiac tracings I have personally interpreted today:  none  Pertinent Radiological findings (summarize): none   Lab Results  Component Value Date   WBC 8.6 07/05/2020   HGB 15.2 (H) 07/05/2020   HCT 46.1 (H) 07/05/2020   PLT 382.0 07/05/2020   GLUCOSE 77 07/05/2020   CHOL 178 07/05/2020   TRIG 108.0 07/05/2020   HDL 75.60 07/05/2020   LDLDIRECT 129.2 05/15/2011   LDLCALC 81 07/05/2020   ALT 16 07/05/2020   AST 20 07/05/2020   NA 132 (L) 07/05/2020   K 4.1 07/05/2020   CL  96 07/05/2020   CREATININE 0.88 07/05/2020   BUN 19 07/05/2020   CO2 28 07/05/2020   TSH 1.67 07/05/2020   INR 1.0 07/17/2016   HGBA1C 5.8 07/05/2020     Assessment & Plan:   Problem List Items Addressed This Visit      Medium   Rectal prolapse    To increase metamucil to daily use      Low back pain - Primary    Ok for otc lidocaine patch topical prn      Hyperlipidemia    Lab Results  Component Value Date   LDLCALC 81 07/05/2020   Stable, pt to continue current statin zocor 20       Relevant Orders   Lipid panel (Completed)   Hepatic function panel (Completed)   CBC with Differential/Platelet (Completed)   TSH (Completed)   Urinalysis, Routine w reflex microscopic (Completed)   Basic metabolic panel (Completed)    Hyperglycemia    Lab Results  Component Value Date   HGBA1C 5.8 07/05/2020   Stable, pt to continue current medical treatment  - diet      Relevant Orders   Hemoglobin A1c (Completed)   HTN (hypertension)    BP Readings from Last 3 Encounters:  07/05/20 140/70  05/02/20 118/62  02/09/20 118/64   Stable, pt to continue medical treatment amlodipine   Current Outpatient Medications (Cardiovascular):  .  amLODipine (NORVASC) 5 MG tablet, TAKE ONE TABLET BY MOUTH DAILY .  CARTIA XT 120 MG 24 hr capsule, TAKE ONE CAPSULE BY MOUTH DAILY .  diltiazem (CARDIZEM) 30 MG tablet, Take 1 tablet (30 mg total) by mouth 4 (four) times daily as needed. .  furosemide (LASIX) 40 MG tablet, TAKE ONE TABLET BY MOUTH DAILY .  nitroGLYCERIN (NITROSTAT) 0.4 MG SL tablet, Place 1 tablet (0.4 mg total) under the tongue every 5 (five) minutes as needed for chest pain. .  simvastatin (ZOCOR) 10 MG tablet, TAKE ONE TABLET BY MOUTH DAILY  Current Outpatient Medications (Respiratory):  .  budesonide-formoterol (SYMBICORT) 80-4.5 MCG/ACT inhaler, INHALE TWO PUFFS BY MOUTH TWICE A DAY *WAIT 20 MINUTES BETWEEN EACH PUFF*  Current Outpatient Medications (Analgesics):  .  acetaminophen (TYLENOL) 500 MG tablet, Take 1,000 mg by mouth 2 (two) times daily as needed for moderate pain or headache. Marland Kitchen  aspirin 81 MG tablet, Take 81 mg by mouth at bedtime. .  traMADol (ULTRAM) 50 MG tablet, TAKE ONE TABLET BY MOUTH TWICE A DAY AS NEEDED   Current Outpatient Medications (Other):  Marland Kitchen  ALPRAZolam (XANAX) 0.25 MG tablet, TAKE ONE TABLET BY MOUTH EVERY NIGHT AT BEDTIME AS NEEDED FOR ANXIETY .  Ascorbic Acid (VITAMIN C PO), Take 1 tablet by mouth daily. Reported on 08/08/2015 .  B Complex Vitamins (VITAMIN B COMPLEX PO), Take 1 tablet by mouth daily. .  calcium carbonate (OS-CAL) 600 MG tablet, Take 600 mg by mouth daily. .  cephALEXin (KEFLEX) 500 MG capsule, Take 1 capsule (500 mg total) by mouth 3 (three) times daily for 10  days. Marland Kitchen  docusate sodium (COLACE) 100 MG capsule, Take 100 mg by mouth daily. Marland Kitchen  gabapentin (NEURONTIN) 100 MG capsule, TAKE ONE CAPSULE BY MOUTH THREE TIMES A DAY .  Glucosamine HCl-MSM (GLUCOSAMINE-MSM PO), Take 2 tablets by mouth daily.  Marland Kitchen  omeprazole (PRILOSEC) 40 MG capsule, TAKE ONE CAPSULE BY MOUTH DAILY .  Respiratory Therapy Supplies (FLUTTER) DEVI, Use as directed .  triamcinolone cream (KENALOG) 0.1 %, Apply 1 application topically daily as  needed (hives). Apply to affected area .  vitamin E 200 UNIT capsule, Take 200 Units by mouth daily.       CKD (chronic kidney disease), stage III (HCC)    Lab Results  Component Value Date   CREATININE 0.88 07/05/2020   Stable overall, cont to avoid nephrotoxins       Relevant Orders   VITAMIN D 25 Hydroxy (Vit-D Deficiency, Fractures) (Completed)    Other Visit Diagnoses    B12 deficiency       Relevant Orders   Vitamin B12 (Completed)      No orders of the defined types were placed in this encounter.   Follow-up: Return in about 6 months (around 01/02/2021).    Oliver Barre, MD 07/08/2020 9:14 PM [facility]

## 2020-07-05 NOTE — Patient Instructions (Signed)

## 2020-07-06 ENCOUNTER — Encounter: Payer: Self-pay | Admitting: Internal Medicine

## 2020-07-06 ENCOUNTER — Other Ambulatory Visit: Payer: Self-pay | Admitting: Internal Medicine

## 2020-07-06 LAB — URINALYSIS, ROUTINE W REFLEX MICROSCOPIC
Bilirubin Urine: NEGATIVE
Hgb urine dipstick: NEGATIVE
Ketones, ur: NEGATIVE
Nitrite: NEGATIVE
RBC / HPF: NONE SEEN (ref 0–?)
Specific Gravity, Urine: 1.01 (ref 1.000–1.030)
Total Protein, Urine: NEGATIVE
Urine Glucose: NEGATIVE
Urobilinogen, UA: 0.2 (ref 0.0–1.0)
pH: 7 (ref 5.0–8.0)

## 2020-07-06 MED ORDER — CEPHALEXIN 500 MG PO CAPS: 500.0000 mg | ORAL_CAPSULE | Freq: Three times a day (TID) | ORAL | 0 refills | Status: AC

## 2020-07-08 ENCOUNTER — Encounter: Payer: Self-pay | Admitting: Internal Medicine

## 2020-07-08 NOTE — Assessment & Plan Note (Signed)
To increase metamucil to daily use

## 2020-07-08 NOTE — Assessment & Plan Note (Signed)
Ok for otc lidocaine patch topical prn

## 2020-07-08 NOTE — Assessment & Plan Note (Signed)
Lab Results  Component Value Date   CREATININE 0.88 07/05/2020   Stable overall, cont to avoid nephrotoxins

## 2020-07-08 NOTE — Assessment & Plan Note (Signed)
BP Readings from Last 3 Encounters:  07/05/20 140/70  05/02/20 118/62  02/09/20 118/64   Stable, pt to continue medical treatment amlodipine   Current Outpatient Medications (Cardiovascular):  .  amLODipine (NORVASC) 5 MG tablet, TAKE ONE TABLET BY MOUTH DAILY .  CARTIA XT 120 MG 24 hr capsule, TAKE ONE CAPSULE BY MOUTH DAILY .  diltiazem (CARDIZEM) 30 MG tablet, Take 1 tablet (30 mg total) by mouth 4 (four) times daily as needed. .  furosemide (LASIX) 40 MG tablet, TAKE ONE TABLET BY MOUTH DAILY .  nitroGLYCERIN (NITROSTAT) 0.4 MG SL tablet, Place 1 tablet (0.4 mg total) under the tongue every 5 (five) minutes as needed for chest pain. .  simvastatin (ZOCOR) 10 MG tablet, TAKE ONE TABLET BY MOUTH DAILY  Current Outpatient Medications (Respiratory):  .  budesonide-formoterol (SYMBICORT) 80-4.5 MCG/ACT inhaler, INHALE TWO PUFFS BY MOUTH TWICE A DAY *WAIT 20 MINUTES BETWEEN EACH PUFF*  Current Outpatient Medications (Analgesics):  .  acetaminophen (TYLENOL) 500 MG tablet, Take 1,000 mg by mouth 2 (two) times daily as needed for moderate pain or headache. Marland Kitchen  aspirin 81 MG tablet, Take 81 mg by mouth at bedtime. .  traMADol (ULTRAM) 50 MG tablet, TAKE ONE TABLET BY MOUTH TWICE A DAY AS NEEDED   Current Outpatient Medications (Other):  Marland Kitchen  ALPRAZolam (XANAX) 0.25 MG tablet, TAKE ONE TABLET BY MOUTH EVERY NIGHT AT BEDTIME AS NEEDED FOR ANXIETY .  Ascorbic Acid (VITAMIN C PO), Take 1 tablet by mouth daily. Reported on 08/08/2015 .  B Complex Vitamins (VITAMIN B COMPLEX PO), Take 1 tablet by mouth daily. .  calcium carbonate (OS-CAL) 600 MG tablet, Take 600 mg by mouth daily. .  cephALEXin (KEFLEX) 500 MG capsule, Take 1 capsule (500 mg total) by mouth 3 (three) times daily for 10 days. Marland Kitchen  docusate sodium (COLACE) 100 MG capsule, Take 100 mg by mouth daily. Marland Kitchen  gabapentin (NEURONTIN) 100 MG capsule, TAKE ONE CAPSULE BY MOUTH THREE TIMES A DAY .  Glucosamine HCl-MSM (GLUCOSAMINE-MSM PO), Take 2  tablets by mouth daily.  Marland Kitchen  omeprazole (PRILOSEC) 40 MG capsule, TAKE ONE CAPSULE BY MOUTH DAILY .  Respiratory Therapy Supplies (FLUTTER) DEVI, Use as directed .  triamcinolone cream (KENALOG) 0.1 %, Apply 1 application topically daily as needed (hives). Apply to affected area .  vitamin E 200 UNIT capsule, Take 200 Units by mouth daily.

## 2020-07-08 NOTE — Assessment & Plan Note (Signed)
>>  ASSESSMENT AND PLAN FOR HTN (HYPERTENSION) WRITTEN ON 07/08/2020  9:19 PM BY Corwin Levins, MD  BP Readings from Last 3 Encounters:  07/05/20 140/70  05/02/20 118/62  02/09/20 118/64   Stable, pt to continue medical treatment amlodipine   Current Outpatient Medications (Cardiovascular):  .  amLODipine (NORVASC) 5 MG tablet, TAKE ONE TABLET BY MOUTH DAILY .  CARTIA XT 120 MG 24 hr capsule, TAKE ONE CAPSULE BY MOUTH DAILY .  diltiazem (CARDIZEM) 30 MG tablet, Take 1 tablet (30 mg total) by mouth 4 (four) times daily as needed. .  furosemide (LASIX) 40 MG tablet, TAKE ONE TABLET BY MOUTH DAILY .  nitroGLYCERIN (NITROSTAT) 0.4 MG SL tablet, Place 1 tablet (0.4 mg total) under the tongue every 5 (five) minutes as needed for chest pain. .  simvastatin (ZOCOR) 10 MG tablet, TAKE ONE TABLET BY MOUTH DAILY  Current Outpatient Medications (Respiratory):  .  budesonide-formoterol (SYMBICORT) 80-4.5 MCG/ACT inhaler, INHALE TWO PUFFS BY MOUTH TWICE A DAY *WAIT 20 MINUTES BETWEEN EACH PUFF*  Current Outpatient Medications (Analgesics):  .  acetaminophen (TYLENOL) 500 MG tablet, Take 1,000 mg by mouth 2 (two) times daily as needed for moderate pain or headache. Marland Kitchen  aspirin 81 MG tablet, Take 81 mg by mouth at bedtime. .  traMADol (ULTRAM) 50 MG tablet, TAKE ONE TABLET BY MOUTH TWICE A DAY AS NEEDED   Current Outpatient Medications (Other):  Marland Kitchen  ALPRAZolam (XANAX) 0.25 MG tablet, TAKE ONE TABLET BY MOUTH EVERY NIGHT AT BEDTIME AS NEEDED FOR ANXIETY .  Ascorbic Acid (VITAMIN C PO), Take 1 tablet by mouth daily. Reported on 08/08/2015 .  B Complex Vitamins (VITAMIN B COMPLEX PO), Take 1 tablet by mouth daily. .  calcium carbonate (OS-CAL) 600 MG tablet, Take 600 mg by mouth daily. .  cephALEXin (KEFLEX) 500 MG capsule, Take 1 capsule (500 mg total) by mouth 3 (three) times daily for 10 days. Marland Kitchen  docusate sodium (COLACE) 100 MG capsule, Take 100 mg by mouth daily. Marland Kitchen  gabapentin (NEURONTIN) 100 MG  capsule, TAKE ONE CAPSULE BY MOUTH THREE TIMES A DAY .  Glucosamine HCl-MSM (GLUCOSAMINE-MSM PO), Take 2 tablets by mouth daily.  Marland Kitchen  omeprazole (PRILOSEC) 40 MG capsule, TAKE ONE CAPSULE BY MOUTH DAILY .  Respiratory Therapy Supplies (FLUTTER) DEVI, Use as directed .  triamcinolone cream (KENALOG) 0.1 %, Apply 1 application topically daily as needed (hives). Apply to affected area .  vitamin E 200 UNIT capsule, Take 200 Units by mouth daily.

## 2020-07-08 NOTE — Assessment & Plan Note (Signed)
Lab Results  Component Value Date   LDLCALC 81 07/05/2020   Stable, pt to continue current statin zocor 20

## 2020-07-08 NOTE — Assessment & Plan Note (Signed)
Lab Results  °Component Value Date  ° HGBA1C 5.8 07/05/2020  ° °Stable, pt to continue current medical treatment  - diet °

## 2020-07-10 ENCOUNTER — Other Ambulatory Visit: Payer: Self-pay

## 2020-07-10 MED ORDER — SIMVASTATIN 10 MG PO TABS: 10.0000 mg | ORAL_TABLET | Freq: Every day | ORAL | 3 refills | Status: DC

## 2020-07-12 DIAGNOSIS — H353233 Exudative age-related macular degeneration, bilateral, with inactive scar: Secondary | ICD-10-CM | POA: Diagnosis not present

## 2020-07-12 DIAGNOSIS — H43392 Other vitreous opacities, left eye: Secondary | ICD-10-CM | POA: Diagnosis not present

## 2020-07-12 DIAGNOSIS — H43813 Vitreous degeneration, bilateral: Secondary | ICD-10-CM | POA: Diagnosis not present

## 2020-07-12 DIAGNOSIS — H35371 Puckering of macula, right eye: Secondary | ICD-10-CM | POA: Diagnosis not present

## 2020-07-17 DIAGNOSIS — Z4689 Encounter for fitting and adjustment of other specified devices: Secondary | ICD-10-CM | POA: Diagnosis not present

## 2020-07-17 DIAGNOSIS — N816 Rectocele: Secondary | ICD-10-CM | POA: Diagnosis not present

## 2020-07-24 ENCOUNTER — Ambulatory Visit: Payer: Medicare Other | Admitting: Pulmonary Disease

## 2020-08-02 ENCOUNTER — Other Ambulatory Visit: Payer: Self-pay | Admitting: Internal Medicine

## 2020-08-23 ENCOUNTER — Encounter: Payer: Self-pay | Admitting: Pulmonary Disease

## 2020-08-23 ENCOUNTER — Other Ambulatory Visit: Payer: Self-pay

## 2020-08-23 ENCOUNTER — Ambulatory Visit (INDEPENDENT_AMBULATORY_CARE_PROVIDER_SITE_OTHER): Payer: Medicare Other | Admitting: Pulmonary Disease

## 2020-08-23 VITALS — BP 124/62 | HR 65 | Temp 98.0°F | Ht 63.0 in | Wt 124.5 lb

## 2020-08-23 DIAGNOSIS — J479 Bronchiectasis, uncomplicated: Secondary | ICD-10-CM | POA: Diagnosis not present

## 2020-08-23 DIAGNOSIS — J849 Interstitial pulmonary disease, unspecified: Secondary | ICD-10-CM | POA: Diagnosis not present

## 2020-08-23 DIAGNOSIS — J449 Chronic obstructive pulmonary disease, unspecified: Secondary | ICD-10-CM

## 2020-08-23 NOTE — Progress Notes (Signed)
Synopsis: Referred in February 2021 for bronchiectasis by Biagio Borg, MD  Subjective:   PATIENT ID: Alexandra Reyes: female DOB: [birth year], MRN: [id number]  Chief Complaint  Patient presents with  . Follow-up    Gets [facility] with exertion/walking.  Having pain in her chest under bil breast and this flares when she is trying to do things around the house.  After rest this clears up. Denies any nausea or back pain or pain radiating into her arms or neck.     This is a 87 year old female history of hypertension reflux COPD.  Followed in the pulmonary clinic for bronchiectasis last seen in the clinic November 2020 by Eric Form, NP.  Patient is a former patient of Dr. Lake Bells.  Has had some chest pains treated with nitroglycerin history of coronary disease.  Currently compliant with use of flutter valve Symbicort plus Mucinex.  Latest pulmonary function test completed in March 2020 prior CT imaging of the chest in 2017, also CT imaging July 2020 bilateral lower lobe bronchiectasis..  Rheumatologic labs in 2017 were negative.  Doing okay with full airway clearance devices.  Breathing somewhat stable.  Occupation: Patient is from [place].  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  She came to [facility] in the late 1940s early 1950s as disaster relief from the [employer].  She came here to work inside the [place] off of [employer].  And routinely managed patients on iron lung.  She stated "the biggest problem where the doctors, they would come in on Sunday after church to visit and not wash their hands."  OV 08/04/2019: She has been doing ok with her breathing. She has been having pains in her chest but this is better now on gabapentin. She uses her flutter valve at least twice per days.  She has been using this and does feel like she brings up some sputum.  He does not always feel like she is able to clear her airways.  She has lived by herself for  the past 25 years after the death of her husband from lung cancer.    OV 08/23/2020: Here today for follow-up.  Overall breathing well.  She still complains of intermittent chest pains around the inferior portion of the thoracic cage.  She sees PM&R as well as primary care for this.  Pain is controlled with Tylenol and gabapentin.  She does feel sleepy during the day.  She does not usually get in the bed till after 11 PM.  She is not limited at this time by any respiratory issues.  Uses her flutter valve and as needed Mucinex.   Past Medical History:  Diagnosis Date  . Age-related macular degeneration, wet, both eyes ([place])   . Alcohol dependence in remission ([place]) 05/22/2011  . Allergic rhinitis, cause unspecified 05/22/2011  . Allergy   . Anemia, iron deficiency 05/18/2011  . Cholelithiasis   . COPD (chronic obstructive pulmonary disease) ([place])   . DDD (degenerative disc disease), lumbar 05/18/2011  . Depression    "@ times" (09/19/2016)  . First degree AV block   . GERD (gastroesophageal reflux disease)   . Hiatal hernia   . History of blood transfusion    "3 or 4 related to childbirth"  . HTN (hypertension) 05/18/2011   pt denies this hx on 09/19/2016  . Hyperlipidemia 05/18/2011  . Macular degeneration   . Myocardial infarction ([place])    "EKG shows I've had 2; date  unknown" (09/19/2016)  . OA (osteoarthritis)    "a little here and there; mainly in the back" (09/19/2016)  . Osteoporosis 05/18/2011  . PAT (paroxysmal atrial tachycardia) ([place])   . Pectus excavatum 05/18/2011  . Personal history of colonic polyps 10/28/2012  . Pneumonia    "as a child"  . SVT (supraventricular tachycardia) ([place])   . Urinary frequency      Family History  Problem Relation Age of Onset  . Heart failure Mother   . Arthritis Mother   . Kidney failure Father   . Heart disease Father   . Hypertension Father      Past Surgical History:  Procedure Laterality Date  . ABDOMINAL HYSTERECTOMY  1988  .  BREAST BIOPSY Right 1948   "it was ok"  . CARDIAC CATHETERIZATION    . CARDIOVASCULAR STRESS TEST  03/08/2009   EF 84%  . CATARACT EXTRACTION W/ INTRAOCULAR LENS  IMPLANT, BILATERAL Bilateral   . INGUINAL HERNIA REPAIR Right 06/23/2017   Procedure: REPAIR INCARCERATED  RIGHT FEMORAL HERNIA WITH MESH;  Surgeon: Erroll Luna, MD;  Location: [facility];  Service: General;  Laterality: Right;  . KNEE ARTHROSCOPY Right   . RIGHT/LEFT HEART CATH AND CORONARY ANGIOGRAPHY N/A 07/25/2016   Procedure: Right/Left Heart Cath and Coronary Angiography;  Surgeon: Burnell Blanks, MD;  Location: [facility];  Service: Cardiovascular;  Laterality: N/A;  . SVT ABLATION  09/19/2016  . SVT ABLATION N/A 09/19/2016   Procedure: SVT Ablation;  Surgeon: Thompson Grayer, MD;  Location: [facility];  Service: Cardiovascular;  Laterality: N/A;  . TONSILLECTOMY AND ADENOIDECTOMY    . US ECHOCARDIOGRAPHY  01/17/2003   EF 55-60%    Social History   Socioeconomic History  . Marital status: Widowed    Spouse name: Not on file  . Number of children: 2  . Years of education: 63  . Highest education level: Not on file  Occupational History  . Occupation: Retired Equities trader  Tobacco Use  . Smoking status: Former Smoker    Packs/day: 1.50    Years: 36.00    Pack years: 54.00    Types: Cigarettes    Start date: 03/01/1959    Quit date: 06/10/1994    Years since quitting: 26.2  . Smokeless tobacco: Never Used  Vaping Use  . Vaping Use: Never used  Substance and Sexual Activity  . Alcohol use: No  . Drug use: No  . Sexual activity: Never  Other Topics Concern  . Not on file  Social History Narrative   Originally from [place], [place]. She moved to [place] in 1950 during the polio epidemic. Previously worked as a Marine scientist. No pets currently. Remote bird exposure. No mold or hot tub exposure.    Social Determinants of Health   Financial Resource Strain: Low Risk   . Difficulty of Paying Living  Expenses: Not hard at all  Food Insecurity: No Food Insecurity  . Worried About Charity fundraiser in the Last Year: Never true  . Ran Out of Food in the Last Year: Never true  Transportation Needs: No Transportation Needs  . Lack of Transportation (Medical): No  . Lack of Transportation (Non-Medical): No  Physical Activity: Not on file  Stress: No Stress Concern Present  . Feeling of Stress : Not at all  Social Connections: Not on file  Intimate Partner Violence: Not on file     No Known Allergies   Outpatient Medications Prior to Visit  Medication Sig Dispense Refill  . acetaminophen (TYLENOL) 500 MG tablet Take 1,000 mg by mouth 2 (two) times daily as needed for moderate pain or headache.    . ALPRAZolam (XANAX) 0.25 MG tablet TAKE ONE TABLET BY MOUTH EVERY NIGHT AT BEDTIME AS NEEDED FOR ANXIETY 90 tablet 1  . amLODipine (NORVASC) 5 MG tablet TAKE ONE TABLET BY MOUTH DAILY 90 tablet 1  . Ascorbic Acid (VITAMIN C PO) Take 1 tablet by mouth daily. Reported on 08/08/2015    . aspirin 81 MG tablet Take 81 mg by mouth at bedtime.    . B Complex Vitamins (VITAMIN B COMPLEX PO) Take 1 tablet by mouth daily.    . budesonide-formoterol (SYMBICORT) 80-4.5 MCG/ACT inhaler INHALE TWO PUFFS BY MOUTH TWICE A DAY *WAIT 20 MINUTES BETWEEN EACH PUFF* 10.2 g 5  . calcium carbonate (OS-CAL) 600 MG tablet Take 600 mg by mouth daily.    Marland Kitchen CARTIA XT 120 MG 24 hr capsule TAKE ONE CAPSULE BY MOUTH DAILY 90 capsule 3  . diltiazem (CARDIZEM) 30 MG tablet Take 1 tablet (30 mg total) by mouth 4 (four) times daily as needed. 120 tablet 11  . furosemide (LASIX) 40 MG tablet TAKE ONE TABLET BY MOUTH DAILY 90 tablet 3  . gabapentin (NEURONTIN) 100 MG capsule TAKE ONE CAPSULE BY MOUTH THREE TIMES A DAY 90 capsule 10  . Glucosamine HCl-MSM (GLUCOSAMINE-MSM PO) Take 2 tablets by mouth daily.     . nitroGLYCERIN (NITROSTAT) 0.4 MG SL tablet Place 1 tablet (0.4 mg total) under the tongue every 5 (five) minutes as  needed for chest pain. 25 tablet 3  . omeprazole (PRILOSEC) 40 MG capsule TAKE ONE CAPSULE BY MOUTH DAILY 90 capsule 1  . Respiratory Therapy Supplies (FLUTTER) DEVI Use as directed 1 each 0  . simvastatin (ZOCOR) 10 MG tablet Take 1 tablet (10 mg total) by mouth daily. 90 tablet 3  . traMADol (ULTRAM) 50 MG tablet TAKE ONE TABLET BY MOUTH TWICE A DAY AS NEEDED 60 tablet 2  . triamcinolone cream (KENALOG) 0.1 % Apply 1 application topically daily as needed (hives). Apply to affected area 30 g 0  . vitamin E 200 UNIT capsule Take 200 Units by mouth daily.    Marland Kitchen docusate sodium (COLACE) 100 MG capsule Take 100 mg by mouth daily. (Patient not taking: Reported on 08/23/2020)     No facility-administered medications prior to visit.    Review of Systems  Constitutional: Negative for chills, fever, malaise/fatigue and weight loss.  HENT: Negative for hearing loss, sore throat and tinnitus.   Eyes: Negative for blurred vision and double vision.  Respiratory: Negative for cough, hemoptysis, sputum production, shortness of breath, wheezing and stridor.   Cardiovascular: Negative for chest pain, palpitations, orthopnea, leg swelling and PND.  Gastrointestinal: Negative for abdominal pain, constipation, diarrhea, heartburn, nausea and vomiting.  Genitourinary: Negative for dysuria, hematuria and urgency.  Musculoskeletal: Negative for joint pain and myalgias.  Skin: Negative for itching and rash.  Neurological: Negative for dizziness, tingling, weakness and headaches.  Endo/Heme/Allergies: Negative for environmental allergies. Does not bruise/bleed easily.  Psychiatric/Behavioral: Negative for depression. The patient is not nervous/anxious and does not have insomnia.   All other systems reviewed and are negative.    Objective:  Physical Exam Vitals reviewed.  Constitutional:      General: She is not in acute distress.    Appearance: She is well-developed.     Comments: Thin elderly  HENT:  Head: Normocephalic and atraumatic.  Eyes:     General: No scleral icterus.    Conjunctiva/sclera: Conjunctivae normal.     Pupils: Pupils are equal, round, and reactive to light.  Neck:     Vascular: No JVD.     Trachea: No tracheal deviation.  Cardiovascular:     Rate and Rhythm: Normal rate and regular rhythm.     Heart sounds: Normal heart sounds. No murmur heard.   Pulmonary:     Effort: Pulmonary effort is normal. No tachypnea, accessory muscle usage or respiratory distress.     Breath sounds: Normal breath sounds. No stridor. No wheezing, rhonchi or rales.  Abdominal:     General: Bowel sounds are normal. There is no distension.     Palpations: Abdomen is soft.     Tenderness: There is no abdominal tenderness.  Musculoskeletal:        General: No tenderness.     Cervical back: Neck supple.  Lymphadenopathy:     Cervical: No cervical adenopathy.  Skin:    General: Skin is warm and dry.     Capillary Refill: Capillary refill takes less than 2 seconds.     Findings: No rash.  Neurological:     Mental Status: She is alert and oriented to person, place, and time.  Psychiatric:        Behavior: Behavior normal.      Vitals:   08/23/20 1348  BP: 124/62  Pulse: 65  Temp: 98 F (36.7 C)  TempSrc: Tympanic  SpO2: 96%  Weight: 124 lb 8 oz (56.5 kg)  Height: '5\' 3"'  (1.6 m)   96% on RA BMI Readings from Last 3 Encounters:  08/23/20 22.05 kg/m  07/05/20 21.79 kg/m  05/02/20 22.50 kg/m   Wt Readings from Last 3 Encounters:  08/23/20 124 lb 8 oz (56.5 kg)  07/05/20 123 lb (55.8 kg)  05/02/20 127 lb (57.6 kg)     CBC    Component Value Date/Time   WBC 8.6 07/05/2020 1356   RBC 5.19 (H) 07/05/2020 1356   HGB 15.2 (H) 07/05/2020 1356   HGB 13.9 07/17/2016 1029   HCT 46.1 (H) 07/05/2020 1356   HCT 41.7 07/17/2016 1029   PLT 382.0 07/05/2020 1356   PLT 387 (H) 07/17/2016 1029   MCV 89.0 07/05/2020 1356   MCV 89 07/17/2016 1029   MCH 30.0 06/25/2017 1034    MCHC 32.9 07/05/2020 1356   RDW 14.6 07/05/2020 1356   RDW 14.7 07/17/2016 1029   LYMPHSABS 1.1 07/05/2020 1356   LYMPHSABS 1.3 07/17/2016 1029   MONOABS 1.0 07/05/2020 1356   EOSABS 0.1 07/05/2020 1356   EOSABS 0.2 07/17/2016 1029   BASOSABS 0.1 07/05/2020 1356   BASOSABS 0.1 07/17/2016 1029    Chest Imaging: July 2020 CT chest bilateral lower lobe bronchiectasis.  Pulmonary Functions Testing Results: PFT Results Latest Ref Rng & Units 08/26/2018 07/12/2015 04/28/2013  FVC-Pre L 2.27 2.29 2.34  FVC-Predicted Pre % 112 106 103  FVC-Post L 2.29 2.39 2.33  FVC-Predicted Post % 113 110 103  Pre FEV1/FVC % % 75 68 69  Post FEV1/FCV % % 76 74 70  FEV1-Pre L 1.70 1.57 1.63  FEV1-Predicted Pre % 114 99 97  FEV1-Post L 1.74 1.76 1.63  DLCO uncorrected ml/min/mmHg 12.50 12.09 12.65  DLCO UNC% % 71 52 55  DLVA Predicted % 89 70 67  TLC L 4.86 - 4.51  TLC % Predicted % 99 - 92  RV % Predicted %  107 - 76    Assessment & Plan:     ICD-10-CM   1. Bronchiectasis without acute exacerbation ([place])  J47.9   2. Chronic obstructive pulmonary disease, unspecified COPD type ([place])  J44.9   3. ILD (interstitial lung disease) ([place])  J84.9     Assessment:   This is a 87 year old female with chronic bronchiectasis, COPD, ILD.  Her biggest complaint today is a recurrent chest pains which have been labeled as costochondritis.  This has been going on since the last time we met.  Her respiratory symptoms are managed with flutter valve to help mobilize secretions.  She currently lives alone unable to do CPT by her self.    Plan: Continue Symbicort Continue current airway clearance techniques Follow-up with [place] in 1 year or as needed.    Current Outpatient Medications:  .  acetaminophen (TYLENOL) 500 MG tablet, Take 1,000 mg by mouth 2 (two) times daily as needed for moderate pain or headache., Disp: , Rfl:  .  ALPRAZolam (XANAX) 0.25 MG tablet, TAKE ONE TABLET BY MOUTH EVERY NIGHT AT BEDTIME AS  NEEDED FOR ANXIETY, Disp: 90 tablet, Rfl: 1 .  amLODipine (NORVASC) 5 MG tablet, TAKE ONE TABLET BY MOUTH DAILY, Disp: 90 tablet, Rfl: 1 .  Ascorbic Acid (VITAMIN C PO), Take 1 tablet by mouth daily. Reported on 08/08/2015, Disp: , Rfl:  .  aspirin 81 MG tablet, Take 81 mg by mouth at bedtime., Disp: , Rfl:  .  B Complex Vitamins (VITAMIN B COMPLEX PO), Take 1 tablet by mouth daily., Disp: , Rfl:  .  budesonide-formoterol (SYMBICORT) 80-4.5 MCG/ACT inhaler, INHALE TWO PUFFS BY MOUTH TWICE A DAY *WAIT 20 MINUTES BETWEEN EACH PUFF*, Disp: 10.2 g, Rfl: 5 .  calcium carbonate (OS-CAL) 600 MG tablet, Take 600 mg by mouth daily., Disp: , Rfl:  .  CARTIA XT 120 MG 24 hr capsule, TAKE ONE CAPSULE BY MOUTH DAILY, Disp: 90 capsule, Rfl: 3 .  diltiazem (CARDIZEM) 30 MG tablet, Take 1 tablet (30 mg total) by mouth 4 (four) times daily as needed., Disp: 120 tablet, Rfl: 11 .  furosemide (LASIX) 40 MG tablet, TAKE ONE TABLET BY MOUTH DAILY, Disp: 90 tablet, Rfl: 3 .  gabapentin (NEURONTIN) 100 MG capsule, TAKE ONE CAPSULE BY MOUTH THREE TIMES A DAY, Disp: 90 capsule, Rfl: 10 .  Glucosamine HCl-MSM (GLUCOSAMINE-MSM PO), Take 2 tablets by mouth daily. , Disp: , Rfl:  .  nitroGLYCERIN (NITROSTAT) 0.4 MG SL tablet, Place 1 tablet (0.4 mg total) under the tongue every 5 (five) minutes as needed for chest pain., Disp: 25 tablet, Rfl: 3 .  omeprazole (PRILOSEC) 40 MG capsule, TAKE ONE CAPSULE BY MOUTH DAILY, Disp: 90 capsule, Rfl: 1 .  Respiratory Therapy Supplies (FLUTTER) DEVI, Use as directed, Disp: 1 each, Rfl: 0 .  simvastatin (ZOCOR) 10 MG tablet, Take 1 tablet (10 mg total) by mouth daily., Disp: 90 tablet, Rfl: 3 .  traMADol (ULTRAM) 50 MG tablet, TAKE ONE TABLET BY MOUTH TWICE A DAY AS NEEDED, Disp: 60 tablet, Rfl: 2 .  triamcinolone cream (KENALOG) 0.1 %, Apply 1 application topically daily as needed (hives). Apply to affected area, Disp: 30 g, Rfl: 0 .  vitamin E 200 UNIT capsule, Take 200 Units by mouth  daily., Disp: , Rfl:    Garner Nash, DO [facility] 08/23/2020 1:55 PM

## 2020-08-23 NOTE — Patient Instructions (Signed)
Thank you for visiting Dr. Tonia Brooms at [facility]. Today we recommend the following:  Continue symbicort  Call us if symptoms change   Return in about 1 year (around 08/23/2021), or if symptoms worsen or fail to improve, for with APP or Dr. Tonia Brooms.    Please do your part to reduce the spread of COVID-19.

## 2020-09-03 ENCOUNTER — Emergency Department (HOSPITAL_COMMUNITY): Payer: Medicare Other

## 2020-09-03 ENCOUNTER — Ambulatory Visit (HOSPITAL_COMMUNITY)
Admission: EM | Admit: 2020-09-03 | Discharge: 2020-09-03 | Disposition: A | Discharge status: Other Acute Inpatient Hospital | Payer: Medicare Other

## 2020-09-03 ENCOUNTER — Inpatient Hospital Stay (HOSPITAL_COMMUNITY)
Admission: EM | Admit: 2020-09-03 | Discharge: 2020-09-07 | DRG: 483 | Disposition: A | Discharge status: Home Health | Payer: Medicare Other | Attending: Internal Medicine | Admitting: Internal Medicine

## 2020-09-03 ENCOUNTER — Encounter (HOSPITAL_COMMUNITY): Payer: Self-pay | Admitting: Emergency Medicine

## 2020-09-03 ENCOUNTER — Other Ambulatory Visit: Payer: Self-pay

## 2020-09-03 DIAGNOSIS — J449 Chronic obstructive pulmonary disease, unspecified: Secondary | ICD-10-CM | POA: Diagnosis present

## 2020-09-03 DIAGNOSIS — S12110A Anterior displaced Type II dens fracture, initial encounter for closed fracture: Secondary | ICD-10-CM | POA: Diagnosis present

## 2020-09-03 DIAGNOSIS — S42252A Displaced fracture of greater tuberosity of left humerus, initial encounter for closed fracture: Secondary | ICD-10-CM | POA: Diagnosis not present

## 2020-09-03 DIAGNOSIS — Z7951 Long term (current) use of inhaled steroids: Secondary | ICD-10-CM

## 2020-09-03 DIAGNOSIS — F1021 Alcohol dependence, in remission: Secondary | ICD-10-CM | POA: Diagnosis present

## 2020-09-03 DIAGNOSIS — E876 Hypokalemia: Secondary | ICD-10-CM | POA: Diagnosis not present

## 2020-09-03 DIAGNOSIS — K219 Gastro-esophageal reflux disease without esophagitis: Secondary | ICD-10-CM | POA: Diagnosis present

## 2020-09-03 DIAGNOSIS — S42212A Unspecified displaced fracture of surgical neck of left humerus, initial encounter for closed fracture: Secondary | ICD-10-CM

## 2020-09-03 DIAGNOSIS — S02109A Fracture of base of skull, unspecified side, initial encounter for closed fracture: Secondary | ICD-10-CM | POA: Diagnosis not present

## 2020-09-03 DIAGNOSIS — S299XXA Unspecified injury of thorax, initial encounter: Secondary | ICD-10-CM | POA: Diagnosis not present

## 2020-09-03 DIAGNOSIS — J849 Interstitial pulmonary disease, unspecified: Secondary | ICD-10-CM | POA: Diagnosis present

## 2020-09-03 DIAGNOSIS — S0219XA Other fracture of base of skull, initial encounter for closed fracture: Secondary | ICD-10-CM | POA: Diagnosis not present

## 2020-09-03 DIAGNOSIS — I672 Cerebral atherosclerosis: Secondary | ICD-10-CM | POA: Diagnosis not present

## 2020-09-03 DIAGNOSIS — I110 Hypertensive heart disease with heart failure: Secondary | ICD-10-CM | POA: Diagnosis present

## 2020-09-03 DIAGNOSIS — S12090A Other displaced fracture of first cervical vertebra, initial encounter for closed fracture: Secondary | ICD-10-CM | POA: Diagnosis present

## 2020-09-03 DIAGNOSIS — Z87891 Personal history of nicotine dependence: Secondary | ICD-10-CM

## 2020-09-03 DIAGNOSIS — I2510 Atherosclerotic heart disease of native coronary artery without angina pectoris: Secondary | ICD-10-CM | POA: Diagnosis present

## 2020-09-03 DIAGNOSIS — S22089A Unspecified fracture of T11-T12 vertebra, initial encounter for closed fracture: Secondary | ICD-10-CM | POA: Diagnosis present

## 2020-09-03 DIAGNOSIS — E785 Hyperlipidemia, unspecified: Secondary | ICD-10-CM | POA: Diagnosis present

## 2020-09-03 DIAGNOSIS — Z841 Family history of disorders of kidney and ureter: Secondary | ICD-10-CM

## 2020-09-03 DIAGNOSIS — S22000A Wedge compression fracture of unspecified thoracic vertebra, initial encounter for closed fracture: Secondary | ICD-10-CM

## 2020-09-03 DIAGNOSIS — S199XXA Unspecified injury of neck, initial encounter: Secondary | ICD-10-CM | POA: Diagnosis not present

## 2020-09-03 DIAGNOSIS — M7989 Other specified soft tissue disorders: Secondary | ICD-10-CM | POA: Diagnosis not present

## 2020-09-03 DIAGNOSIS — J9811 Atelectasis: Secondary | ICD-10-CM | POA: Diagnosis not present

## 2020-09-03 DIAGNOSIS — I700 Atherosclerosis of aorta: Secondary | ICD-10-CM | POA: Diagnosis not present

## 2020-09-03 DIAGNOSIS — S42242A 4-part fracture of surgical neck of left humerus, initial encounter for closed fracture: Secondary | ICD-10-CM | POA: Diagnosis not present

## 2020-09-03 DIAGNOSIS — S22020A Wedge compression fracture of second thoracic vertebra, initial encounter for closed fracture: Secondary | ICD-10-CM | POA: Diagnosis not present

## 2020-09-03 DIAGNOSIS — Y92017 Garden or yard in single-family (private) house as the place of occurrence of the external cause: Secondary | ICD-10-CM

## 2020-09-03 DIAGNOSIS — S129XXA Fracture of neck, unspecified, initial encounter: Secondary | ICD-10-CM | POA: Diagnosis present

## 2020-09-03 DIAGNOSIS — Z96612 Presence of left artificial shoulder joint: Secondary | ICD-10-CM

## 2020-09-03 DIAGNOSIS — Z8261 Family history of arthritis: Secondary | ICD-10-CM

## 2020-09-03 DIAGNOSIS — W010XXA Fall on same level from slipping, tripping and stumbling without subsequent striking against object, initial encounter: Secondary | ICD-10-CM | POA: Diagnosis present

## 2020-09-03 DIAGNOSIS — S42292A Other displaced fracture of upper end of left humerus, initial encounter for closed fracture: Secondary | ICD-10-CM | POA: Diagnosis not present

## 2020-09-03 DIAGNOSIS — Z7982 Long term (current) use of aspirin: Secondary | ICD-10-CM

## 2020-09-03 DIAGNOSIS — S12000A Unspecified displaced fracture of first cervical vertebra, initial encounter for closed fracture: Secondary | ICD-10-CM | POA: Diagnosis not present

## 2020-09-03 DIAGNOSIS — Z8249 Family history of ischemic heart disease and other diseases of the circulatory system: Secondary | ICD-10-CM

## 2020-09-03 DIAGNOSIS — I471 Supraventricular tachycardia, unspecified: Secondary | ICD-10-CM | POA: Diagnosis present

## 2020-09-03 DIAGNOSIS — M25512 Pain in left shoulder: Secondary | ICD-10-CM | POA: Diagnosis not present

## 2020-09-03 DIAGNOSIS — S12112A Nondisplaced Type II dens fracture, initial encounter for closed fracture: Secondary | ICD-10-CM | POA: Diagnosis not present

## 2020-09-03 DIAGNOSIS — M542 Cervicalgia: Secondary | ICD-10-CM | POA: Diagnosis not present

## 2020-09-03 DIAGNOSIS — I509 Heart failure, unspecified: Secondary | ICD-10-CM | POA: Diagnosis present

## 2020-09-03 DIAGNOSIS — S0031XA Abrasion of nose, initial encounter: Secondary | ICD-10-CM | POA: Diagnosis present

## 2020-09-03 DIAGNOSIS — I252 Old myocardial infarction: Secondary | ICD-10-CM

## 2020-09-03 DIAGNOSIS — S12100A Unspecified displaced fracture of second cervical vertebra, initial encounter for closed fracture: Secondary | ICD-10-CM

## 2020-09-03 DIAGNOSIS — Z79899 Other long term (current) drug therapy: Secondary | ICD-10-CM

## 2020-09-03 DIAGNOSIS — Z20822 Contact with and (suspected) exposure to covid-19: Secondary | ICD-10-CM | POA: Diagnosis present

## 2020-09-03 DIAGNOSIS — E871 Hypo-osmolality and hyponatremia: Secondary | ICD-10-CM | POA: Diagnosis present

## 2020-09-03 DIAGNOSIS — E875 Hyperkalemia: Secondary | ICD-10-CM | POA: Diagnosis present

## 2020-09-03 DIAGNOSIS — S42202A Unspecified fracture of upper end of left humerus, initial encounter for closed fracture: Secondary | ICD-10-CM | POA: Diagnosis not present

## 2020-09-03 LAB — CBC
HCT: 43.6 % (ref 36.0–46.0)
Hemoglobin: 15 g/dL (ref 12.0–15.0)
MCH: 29.7 pg (ref 26.0–34.0)
MCHC: 34.4 g/dL (ref 30.0–36.0)
MCV: 86.3 fL (ref 80.0–100.0)
Platelets: 359 10*3/uL (ref 150–400)
RBC: 5.05 MIL/uL (ref 3.87–5.11)
RDW: 14.1 % (ref 11.5–15.5)
WBC: 17.3 10*3/uL — ABNORMAL HIGH (ref 4.0–10.5)
nRBC: 0 % (ref 0.0–0.2)

## 2020-09-03 LAB — RESP PANEL BY RT-PCR (FLU A&B, COVID) ARPGX2
Influenza A by PCR: NEGATIVE
Influenza B by PCR: NEGATIVE
SARS Coronavirus 2 by RT PCR: NEGATIVE

## 2020-09-03 LAB — BASIC METABOLIC PANEL
Anion gap: 14 (ref 5–15)
BUN: 17 mg/dL (ref 8–23)
CO2: 17 mmol/L — ABNORMAL LOW (ref 22–32)
Calcium: 8.8 mg/dL — ABNORMAL LOW (ref 8.9–10.3)
Chloride: 95 mmol/L — ABNORMAL LOW (ref 98–111)
Creatinine, Ser: 0.94 mg/dL (ref 0.44–1.00)
GFR, Estimated: 57 mL/min — ABNORMAL LOW (ref >60)
Glucose, Bld: 110 mg/dL — ABNORMAL HIGH (ref 70–99)
Potassium: 5.6 mmol/L — ABNORMAL HIGH (ref 3.5–5.1)
Sodium: 126 mmol/L — ABNORMAL LOW (ref 135–145)

## 2020-09-03 MED ORDER — MORPHINE SULFATE (PF) 2 MG/ML IV SOLN
1.0000 mg | INTRAVENOUS | Status: DC | PRN
  Administered 2020-09-03 – 2020-09-06 (×10): 1 mg via INTRAVENOUS
  Filled 2020-09-03 (×10): qty 1

## 2020-09-03 MED ORDER — MORPHINE SULFATE (PF) 4 MG/ML IV SOLN
4.0000 mg | Freq: Once | INTRAVENOUS | Status: AC
  Administered 2020-09-03: 4 mg via INTRAVENOUS
  Filled 2020-09-03: qty 1

## 2020-09-03 NOTE — ED Provider Notes (Signed)
[facility] EMERGENCY DEPARTMENT Provider Note   CSN: [id number] Arrival date & time: 09/03/20  1655     History Chief Complaint  Patient presents with  . Fall    Alexandra Reyes is a 87 y.o. female w/ hx of DDD, HTN, presenting to ED with mechanical fall and shoulder pain and neck pain.  She slipped walking outside, fell onto her left shoulder and rolled down a hill, struck her head on the ground.  Possible brief LOC.  She is not on A/C.  She has an extensive medical history as noted below.  She reports pain in her left shoulder and upper arm with movement, and pain in her neck and mid-back.  Mild headache.  No numbness or weakness.  HPI     Past Medical History:  Diagnosis Date  . Age-related macular degeneration, wet, both eyes (HCC)   . Alcohol dependence in remission (HCC) 05/22/2011  . Allergic rhinitis, cause unspecified 05/22/2011  . Allergy   . Anemia, iron deficiency 05/18/2011  . Cholelithiasis   . COPD (chronic obstructive pulmonary disease) (HCC)   . DDD (degenerative disc disease), lumbar 05/18/2011  . Depression    "@ times" (09/19/2016)  . First degree AV block   . GERD (gastroesophageal reflux disease)   . Hiatal hernia   . History of blood transfusion    "3 or 4 related to childbirth"  . HTN (hypertension) 05/18/2011   pt denies this hx on 09/19/2016  . Hyperlipidemia 05/18/2011  . Macular degeneration   . Myocardial infarction (HCC)    "EKG shows I've had 2; date unknown" (09/19/2016)  . OA (osteoarthritis)    "a little here and there; mainly in the back" (09/19/2016)  . Osteoporosis 05/18/2011  . PAT (paroxysmal atrial tachycardia) (HCC)   . Pectus excavatum 05/18/2011  . Personal history of colonic polyps 10/28/2012  . Pneumonia    "as a child"  . SVT (supraventricular tachycardia) (HCC)   . Urinary frequency     Patient Active Problem List   Diagnosis Date Noted  . Cervical spine fracture (HCC) 09/03/2020  . Rectal prolapse  07/05/2020  . Low back pain 07/05/2020  . Spondylosis without myelopathy or radiculopathy, lumbar region 11/23/2019  . Costochondritis 08/10/2018  . Wrist pain 06/20/2018  . Abnormal urine color 06/17/2018  . Hyperglycemia 12/15/2017  . Femoral hernia of right side with obstruction 06/23/2017  . CKD (chronic kidney disease), stage III (HCC) 03/12/2017  . Weight loss 11/27/2016  . SVT (supraventricular tachycardia) (HCC) 09/19/2016  . Left-sided nosebleed 09/11/2016  . Coronary artery disease involving native coronary artery of native heart without angina pectoris   . ILD (interstitial lung disease) (HCC) 05/22/2016  . Constipation 03/13/2016  . Bronchiectasis without acute exacerbation (HCC) 03/13/2016  . Intercostal neuralgia 08/30/2015  . Dyspnea 08/02/2015  . Bilateral leg pain 02/01/2015  . Diastolic dysfunction 02/01/2015  . Peripheral edema 02/01/2015  . Abnormal EKG 05/18/2014  . Syncope 05/18/2014  . Dyslipidemia 05/14/2013  . Dyspnea on exertion 05/14/2013  . DOE (dyspnea on exertion) 04/28/2013  . Personal history of colonic polyps 10/28/2012  . Osteopenia 04/29/2012  . Elevated digoxin level 04/29/2012  . Allergic rhinitis, cause unspecified 05/22/2011  . Alcohol dependence in remission (HCC) 05/22/2011  . Depression 05/22/2011  . Preventative health care 05/18/2011  . HTN (hypertension) 05/18/2011  . Hyperlipidemia 05/18/2011  . Anemia, iron deficiency 05/18/2011  . GERD (gastroesophageal reflux disease) 05/18/2011  . DDD (degenerative disc disease), lumbar 05/18/2011  .  Pectus excavatum 05/18/2011  . Macular degeneration   . OA (osteoarthritis)   . Cholelithiasis   . COPD (chronic obstructive pulmonary disease) (HCC)   . Paroxysmal SVT (supraventricular tachycardia) (HCC) 02/07/2011    Past Surgical History:  Procedure Laterality Date  . ABDOMINAL HYSTERECTOMY  1988  . BREAST BIOPSY Right 1948   "it was ok"  . CARDIAC CATHETERIZATION    .  CARDIOVASCULAR STRESS TEST  03/08/2009   EF 84%  . CATARACT EXTRACTION W/ INTRAOCULAR LENS  IMPLANT, BILATERAL Bilateral   . INGUINAL HERNIA REPAIR Right 06/23/2017   Procedure: REPAIR INCARCERATED  RIGHT FEMORAL HERNIA WITH MESH;  Surgeon: Harriette Bouillon, MD;  Location: [facility] OR;  Service: General;  Laterality: Right;  . KNEE ARTHROSCOPY Right   . RIGHT/LEFT HEART CATH AND CORONARY ANGIOGRAPHY N/A 07/25/2016   Procedure: Right/Left Heart Cath and Coronary Angiography;  Surgeon: Kathleene Hazel, MD;  Location: [facility] INVASIVE CV LAB;  Service: Cardiovascular;  Laterality: N/A;  . SVT ABLATION  09/19/2016  . SVT ABLATION N/A 09/19/2016   Procedure: SVT Ablation;  Surgeon: Hillis Range, MD;  Location: [facility] INVASIVE CV LAB;  Service: Cardiovascular;  Laterality: N/A;  . TONSILLECTOMY AND ADENOIDECTOMY    . US ECHOCARDIOGRAPHY  01/17/2003   EF 55-60%     OB History   No obstetric history on file.     Family History  Problem Relation Age of Onset  . Heart failure Mother   . Arthritis Mother   . Kidney failure Father   . Heart disease Father   . Hypertension Father     Social History   Tobacco Use  . Smoking status: Former Smoker    Packs/day: 1.50    Years: 36.00    Pack years: 54.00    Types: Cigarettes    Start date: 03/01/1959    Quit date: 06/10/1994    Years since quitting: 26.2  . Smokeless tobacco: Never Used  Vaping Use  . Vaping Use: Never used  Substance Use Topics  . Alcohol use: No  . Drug use: No    Home Medications Prior to Admission medications   Medication Sig Start Date End Date Taking? Authorizing Provider  acetaminophen (TYLENOL) 500 MG tablet Take 1,000 mg by mouth 2 (two) times daily as needed for moderate pain or headache.    [provider]  ALPRAZolam Prudy Feeler) 0.25 MG tablet TAKE ONE TABLET BY MOUTH EVERY NIGHT AT BEDTIME AS NEEDED FOR ANXIETY 12/14/19   Corwin Levins, MD  amLODipine (NORVASC) 5 MG tablet TAKE ONE TABLET BY MOUTH DAILY 06/28/20    Allred, Fayrene Fearing, MD  Ascorbic Acid (VITAMIN C PO) Take 1 tablet by mouth daily. Reported on 08/08/2015    [provider]  aspirin 81 MG tablet Take 81 mg by mouth at bedtime.    [provider]  B Complex Vitamins (VITAMIN B COMPLEX PO) Take 1 tablet by mouth daily.    [provider]  budesonide-formoterol (SYMBICORT) 80-4.5 MCG/ACT inhaler INHALE TWO PUFFS BY MOUTH TWICE A DAY *WAIT 20 MINUTES BETWEEN EACH PUFF* 03/09/20   Icard, Elige Radon L, DO  calcium carbonate (OS-CAL) 600 MG tablet Take 600 mg by mouth daily.    [provider]  CARTIA XT 120 MG 24 hr capsule TAKE ONE CAPSULE BY MOUTH DAILY 12/14/19   Allred, Fayrene Fearing, MD  diltiazem (CARDIZEM) 30 MG tablet Take 1 tablet (30 mg total) by mouth 4 (four) times daily as needed. 01/08/17   Hillis Range, MD  furosemide (LASIX) 40 MG tablet TAKE ONE TABLET BY MOUTH DAILY 06/06/20   Jake Bathe, MD  gabapentin (NEURONTIN) 100 MG capsule TAKE ONE CAPSULE BY MOUTH THREE TIMES A DAY 11/18/19   Kirsteins, Victorino Sparrow, MD  Glucosamine HCl-MSM (GLUCOSAMINE-MSM PO) Take 2 tablets by mouth daily.     [provider]  nitroGLYCERIN (NITROSTAT) 0.4 MG SL tablet Place 1 tablet (0.4 mg total) under the tongue every 5 (five) minutes as needed for chest pain. 05/03/19   Jake Bathe, MD  omeprazole (PRILOSEC) 40 MG capsule TAKE ONE CAPSULE BY MOUTH DAILY 04/04/20   Hilarie Fredrickson, MD  Respiratory Therapy Supplies (FLUTTER) DEVI Use as directed 12/02/18   Bevelyn Ngo, NP  simvastatin (ZOCOR) 10 MG tablet Take 1 tablet (10 mg total) by mouth daily. 07/10/20   Jake Bathe, MD  traMADol (ULTRAM) 50 MG tablet TAKE ONE TABLET BY MOUTH TWICE A DAY AS NEEDED 08/02/20   Corwin Levins, MD  triamcinolone cream (KENALOG) 0.1 % Apply 1 application topically daily as needed (hives). Apply to affected area 12/16/18   Corwin Levins, MD  vitamin E 200 UNIT capsule Take 200 Units by mouth daily.    [provider]    Allergies     Patient has no known allergies.  Review of Systems   Review of Systems  Constitutional: Negative for chills and fever.  Eyes: Negative for pain and visual disturbance.  Respiratory: Negative for cough and shortness of breath.   Cardiovascular: Negative for chest pain and palpitations.  Gastrointestinal: Negative for abdominal pain and vomiting.  Musculoskeletal: Positive for arthralgias, myalgias and neck pain.  Skin: Negative for color change and rash.  Neurological: Negative for weakness and numbness.  All other systems reviewed and are negative.   Physical Exam Updated Vital Signs BP (!) 145/77   Pulse (!) 103   Temp 98.1 F (36.7 C) (Oral)   Resp (!) 22   Ht 5\' 3"  (1.6 m)   Wt 56.2 kg   LMP  (LMP Unknown)   SpO2 99%   BMI 21.97 kg/m   Physical Exam Constitutional:      General: She is not in acute distress. HENT:     Head: Normocephalic.     Comments: Abrasion to nose Eyes:     Conjunctiva/sclera: Conjunctivae normal.     Pupils: Pupils are equal, round, and reactive to light.  Cardiovascular:     Rate and Rhythm: Normal rate.     Pulses: Normal pulses.  Pulmonary:     Effort: Pulmonary effort is normal. No respiratory distress.  Abdominal:     General: There is no distension.     Tenderness: There is no abdominal tenderness.  Skin:    General: Skin is warm and dry.  Neurological:     General: No focal deficit present.     Mental Status: She is alert and oriented to person, place, and time. Mental status is at baseline.  Psychiatric:        Mood and Affect: Mood normal.        Behavior: Behavior normal.     ED Results / Procedures / Treatments   Labs (all labs ordered are listed, but only abnormal results are displayed) Labs Reviewed  BASIC METABOLIC PANEL - Abnormal; Notable for the following components:      Result Value   Sodium 126 (*)    Potassium 5.6 (*)    Chloride 95 (*)  CO2 17 (*)    Glucose, Bld 110 (*)    Calcium 8.8 (*)    GFR,  Estimated 57 (*)    All other components within normal limits  CBC - Abnormal; Notable for the following components:   WBC 17.3 (*)    All other components within normal limits  RESP PANEL BY RT-PCR (FLU A&B, COVID) [id number]    EKG None  Radiology CT Head Wo Contrast  Result Date: 09/03/2020 CLINICAL DATA:  Fall EXAM: CT HEAD WITHOUT CONTRAST CT CERVICAL SPINE WITHOUT CONTRAST TECHNIQUE: Multidetector CT imaging of the head and cervical spine was performed following the standard protocol without intravenous contrast. Multiplanar CT image reconstructions of the cervical spine were also generated. COMPARISON:  None. FINDINGS: CT HEAD FINDINGS Brain: There is no mass, hemorrhage or extra-axial collection. The size and configuration of the ventricles and extra-axial CSF spaces are normal. The brain parenchyma is normal, without evidence of acute or chronic infarction. Vascular: Atherosclerotic calcification of the internal carotid arteries at the skull base. No abnormal hyperdensity of the major intracranial arteries or dural venous sinuses. Skull: The visualized skull base, calvarium and extracranial soft tissues are normal. Sinuses/Orbits: No fluid levels or advanced mucosal thickening of the visualized paranasal sinuses. No mastoid or middle ear effusion. The orbits are normal. CT CERVICAL SPINE FINDINGS Alignment: Grade 1 anterolisthesis at C4-5 and C5-6 Skull base and vertebrae: There is hypertrophy and mineralization at the C1-2 level. There is a large, rounded area of lucency through the odontoid process. There is a type 2 fracture through this region with minimal displacement. Additionally, there is a minimally displaced fracture of the midline anterior arch of C1. there is a compression fracture of T2 with approximately 25% height loss but no retropulsion. Soft tissues and spinal canal: No prevertebral fluid or swelling. No visible canal hematoma. Disc levels: No bony spinal canal stenosis Upper  chest: No pneumothorax, pulmonary nodule or pleural effusion. Other: Normal visualized paraspinal cervical soft tissues. IMPRESSION: 1. Minimally displaced anterior C1 arch and type 2 odontoid process fractures. 2. No acute intracranial abnormality. 3. Moderate hypertrophic arthritic change at the atlantoaxial joint, which could be due to rheumatoid arthritis, gout or CPPD. 4. T2 compression fracture with 25% height loss but no retropulsion. Critical Value/emergent results were called by telephone at the time of interpretation on 09/03/2020 at 8:09 pm to provider Lynesha Bango , who verbally acknowledged these results. Electronically Signed   By: Deatra Robinson M.D.   On: 09/03/2020 20:10   CT Chest Wo Contrast  Result Date: 09/03/2020 CLINICAL DATA:  Recent fall with known proximal left humeral fracture and chest pain, evaluate for possible rib fracture EXAM: CT CHEST WITHOUT CONTRAST TECHNIQUE: Multidetector CT imaging of the chest was performed following the standard protocol without IV contrast. COMPARISON:  12/28/2018 FINDINGS: Cardiovascular: Thoracic aorta and its branches demonstrate diffuse atherosclerotic calcifications. No aneurysmal dilatation is identified. Coronary calcifications are seen. Pulmonary artery is not significantly enlarged. Mediastinum/Nodes: Thoracic inlet is within normal limits with the exception of a 2 cm hypodense nodule within the right lobe of the thyroid bridging into the isthmus. No sizable hilar or mediastinal adenopathy is noted. No esophageal abnormality is seen. Lungs/Pleura: Lungs are well aerated bilaterally. No focal infiltrate or sizable effusion is seen. Minimal right basilar atelectasis is noted. No sizable effusion or pneumothorax is seen. Upper Abdomen: Renal cystic change is noted and stable from the prior exam. Remainder of the upper abdomen appears within normal limits. Musculoskeletal: Compression deformity  is noted at T2 which is new from the prior exam of 2020  but of uncertain chronicity. No other compression deformities are seen. Sternum is unremarkable. Comminuted proximal left humeral fracture is again seen stable from prior plain films. No acute rib fracture is noted. IMPRESSION: Stable left humeral fracture. No definitive rib abnormality is seen. Chronic appearing T2 compression fracture. This is new from prior exam of 2020 but of uncertain chronicity. Correlation to point tenderness is recommended. 2 cm hypodense nodule within the right lobe of the thyroid. This is stable from a prior exam of 2020 and given the patient's age no follow-up recommended unless clinically warranted (ref: J Am Coll Radiol. 2015 Feb;12(2): 143-50). Aortic Atherosclerosis (ICD10-I70.0). Electronically Signed   By: Alcide CleverMark  Lukens M.D.   On: 09/03/2020 19:58   CT Cervical Spine Wo Contrast  Result Date: 09/03/2020 CLINICAL DATA:  Fall EXAM: CT HEAD WITHOUT CONTRAST CT CERVICAL SPINE WITHOUT CONTRAST TECHNIQUE: Multidetector CT imaging of the head and cervical spine was performed following the standard protocol without intravenous contrast. Multiplanar CT image reconstructions of the cervical spine were also generated. COMPARISON:  None. FINDINGS: CT HEAD FINDINGS Brain: There is no mass, hemorrhage or extra-axial collection. The size and configuration of the ventricles and extra-axial CSF spaces are normal. The brain parenchyma is normal, without evidence of acute or chronic infarction. Vascular: Atherosclerotic calcification of the internal carotid arteries at the skull base. No abnormal hyperdensity of the major intracranial arteries or dural venous sinuses. Skull: The visualized skull base, calvarium and extracranial soft tissues are normal. Sinuses/Orbits: No fluid levels or advanced mucosal thickening of the visualized paranasal sinuses. No mastoid or middle ear effusion. The orbits are normal. CT CERVICAL SPINE FINDINGS Alignment: Grade 1 anterolisthesis at C4-5 and C5-6 Skull base  and vertebrae: There is hypertrophy and mineralization at the C1-2 level. There is a large, rounded area of lucency through the odontoid process. There is a type 2 fracture through this region with minimal displacement. Additionally, there is a minimally displaced fracture of the midline anterior arch of C1. there is a compression fracture of T2 with approximately 25% height loss but no retropulsion. Soft tissues and spinal canal: No prevertebral fluid or swelling. No visible canal hematoma. Disc levels: No bony spinal canal stenosis Upper chest: No pneumothorax, pulmonary nodule or pleural effusion. Other: Normal visualized paraspinal cervical soft tissues. IMPRESSION: 1. Minimally displaced anterior C1 arch and type 2 odontoid process fractures. 2. No acute intracranial abnormality. 3. Moderate hypertrophic arthritic change at the atlantoaxial joint, which could be due to rheumatoid arthritis, gout or CPPD. 4. T2 compression fracture with 25% height loss but no retropulsion. Critical Value/emergent results were called by telephone at the time of interpretation on 09/03/2020 at 8:09 pm to provider Kelie Gainey , who verbally acknowledged these results. Electronically Signed   By: Deatra RobinsonKevin  Herman M.D.   On: 09/03/2020 20:10   CT HUMERUS LEFT WO CONTRAST  Result Date: 09/03/2020 CLINICAL DATA:  Evaluate right proximal humeral fracture EXAM: CT OF THE UPPER LEFT EXTREMITY WITHOUT CONTRAST TECHNIQUE: Multidetector CT imaging of the upper left extremity was performed according to the standard protocol. COMPARISON:  X-ray 09/03/2020 FINDINGS: Bones/Joint/Cartilage Acute comminuted fracture of the left humeral head and neck. Transverse component of the surgical neck with approximately 1.2 cm of anterior displacement. Greater tuberosity fracture component with approximately 0.6 cm of posterosuperior displacement. Minimally displaced fracture involves the inferior margin of the lesser tuberosity. No fracture involvement  of the humeral head articular  surface. Inferior subluxation of the humeral head likely secondary to space-occupying joint effusion/hemarthrosis. No dislocation. No additional fractures. Mild glenohumeral and acromioclavicular osteoarthritis. Elbow joint intact without fracture or dislocation. Ligaments Suboptimally assessed by CT. Muscles and Tendons Preserved bulk of the rotator cuff musculature. Limited evaluation of the rotator cuff tendons. Soft tissues Prominent soft tissue swelling at the fracture site. No organized fluid collection/hematoma. Visualized portion of the left lung field is clear. IMPRESSION: 1. Acute comminuted fracture of the left humeral head and neck, as described above. 2. Inferior subluxation of the humeral head likely secondary to space-occupying joint effusion/hemarthrosis. Electronically Signed   By: Duanne Guess D.O.   On: 09/03/2020 20:04   DG Shoulder Left  Result Date: 09/03/2020 CLINICAL DATA:  Status post fall while raking leaves. EXAM: LEFT SHOULDER - 2+ VIEW COMPARISON:  None FINDINGS: There is an acute, comminuted impacted fracture deformity involving surgical neck and greater tuberosity of the proximal left humerus. No signs of dislocation. Overlying soft tissue swelling noted. IMPRESSION: Acute, comminuted impacted fracture deformity involving the surgical neck and greater tuberosity of the proximal left humerus. Electronically Signed   By: Signa Kell M.D.   On: 09/03/2020 18:34   DG Humerus Left  Result Date: 09/03/2020 CLINICAL DATA:  Fall raking leaves today.  Left shoulder pain. EXAM: LEFT HUMERUS - 2+ VIEW COMPARISON:  None. FINDINGS: Comminuted and displaced proximal humerus fracture. Dominant fracture plane is through the surgical neck. There is displacement of the greater trochanter of 4 mm. No definite glenohumeral extension. Distal humerus is intact. IMPRESSION: Comminuted and displaced proximal humerus fracture involving the surgical neck and greater  trochanter. Electronically Signed   By: Narda Rutherford M.D.   On: 09/03/2020 18:32    Procedures Procedures   Medications Ordered in ED Medications  morphine 2 MG/ML injection 1 mg (1 mg Intravenous Given 09/03/20 2313)  ALPRAZolam (XANAX) tablet 0.25 mg (has no administration in time range)  morphine 4 MG/ML injection 4 mg (4 mg Intravenous Given 09/03/20 2014)    ED Course  I have reviewed the triage vital signs and the nursing notes.  Pertinent labs & imaging results that were available during my care of the patient were reviewed by me and considered in my medical decision making (see chart for details).  Mechanical fall down hill today  Workup in ED significant for cervical and thoracic fx on CT imaging, and closed left humerus fx  She is neurovascularly intact.  No evidence of spinal cord injury on my exam.  Neurosurgery consulted -recommending C-spine collar and outpt f/u.    Ortho consult placed - by phone no immediate surgical recommendations.  Ortho team to formally consult tonight or tomorrow.  CTH with acute traumatic brain bleed or injury.  Otherwise doubt pelvic fx or extremity fx  IV pain medications ordered Labs reviewed - Na 126, K 5.6, WBC 17.3 - may be reactive.  No fever to suggest infection.  Clinical Course as of 09/04/20 Oval Linsey Sep 03, 2020  2025 CT CERVICAL SPINE FINDINGS  Alignment: Grade 1 anterolisthesis at C4-5 and C5-6  Skull base and vertebrae: There is hypertrophy and mineralization at the C1-2 level. There is a large, rounded area of lucency through the odontoid process. There is a type 2 fracture through this region with minimal displacement. Additionally, there is a minimally displaced fracture of the midline anterior arch of C1. there is a compression fracture of T2 with approximately 25% height loss but no retropulsion.  Soft  tissues and spinal canal: No prevertebral fluid or swelling. No visible canal hematoma.  Disc  levels: No bony spinal canal stenosis  Upper chest: No pneumothorax, pulmonary nodule or pleural effusion.  Other: Normal visualized paraspinal cervical soft tissues.  IMPRESSION: 1. Minimally displaced anterior C1 arch and type 2 odontoid process fractures. 2. No acute intracranial abnormality. 3. Moderate hypertrophic arthritic change at the atlantoaxial joint, which could be due to rheumatoid arthritis, gout or CPPD. 4. T2 compression fracture with 25% height loss but no retropulsion [MT]  2026 Consulted neurosurgery regarding fx, aspen collar applied. [MT]  2143 No surgical plans per neurosurgery team.  Patient to be admitted to hospitalist for pain control, PT evaluation, given her numerous injuries.  I spoke to Dr Aundria Rud earlier from [facility] who advised a sling and follow up, but given the patient will be admitted now, I consulted with Alphonsa Overall from [facility] and asked that their team see the patient this evening or tomorrow.  There were no immediate surgical plans. [MT]  2158 Signed out to hospitalist [MT]    Clinical Course User Index [MT] Terald Sleeper, MD    Final Clinical Impression(s) / ED Diagnoses Final diagnoses:  Closed displaced fracture of surgical neck of left humerus, unspecified fracture morphology, initial encounter  Closed odontoid fracture, initial encounter (HCC)  Compression fracture of thoracic vertebra, unspecified thoracic vertebral level, initial encounter [facility])    Rx / DC Orders ED Discharge Orders    None       Terald Sleeper, MD 09/04/20 0021

## 2020-09-03 NOTE — ED Notes (Signed)
Received verbal report from kristin

## 2020-09-03 NOTE — ED Notes (Signed)
Ortho tech paged at this time

## 2020-09-03 NOTE — ED Notes (Signed)
Provider at bedside at this time

## 2020-09-03 NOTE — Consult Note (Signed)
Chief Complaint   Chief Complaint  Patient presents with  . Fall    HPI   Consult requested by:  Dr Renaye Rakers, EDP [facility] Reason for consult: cervical fractures  HPI: Alexandra Reyes is a 87 y.o. female with multiple medical comorbidites listed below who presented to the ED after a fall. Patient was outside raking when she lost her balance on acorns, falling landing on left shoulder and rolling down a hill. Did strike head. ?LOC.She underwent work up by EDP and was found to have C1, C2 and T2 fractures as well as a left humeral fracture. A NSY consultation was requested. She complains of mostly LUE pain. No significant neck pain. No N/T/W in RUE, BLE. Able to ambulate after the fall. Was placed in aspen c collar by EDP.   Patient Active Problem List   Diagnosis Date Noted  . Rectal prolapse 07/05/2020  . Low back pain 07/05/2020  . Spondylosis without myelopathy or radiculopathy, lumbar region 11/23/2019  . Costochondritis 08/10/2018  . Wrist pain 06/20/2018  . Abnormal urine color 06/17/2018  . Hyperglycemia 12/15/2017  . Femoral hernia of right side with obstruction 06/23/2017  . CKD (chronic kidney disease), stage III (HCC) 03/12/2017  . Weight loss 11/27/2016  . SVT (supraventricular tachycardia) (HCC) 09/19/2016  . Left-sided nosebleed 09/11/2016  . Coronary artery disease involving native coronary artery of native heart without angina pectoris   . ILD (interstitial lung disease) (HCC) 05/22/2016  . Constipation 03/13/2016  . Bronchiectasis without acute exacerbation (HCC) 03/13/2016  . Intercostal neuralgia 08/30/2015  . Dyspnea 08/02/2015  . Bilateral leg pain 02/01/2015  . Diastolic dysfunction 02/01/2015  . Peripheral edema 02/01/2015  . Abnormal EKG 05/18/2014  . Syncope 05/18/2014  . Dyslipidemia 05/14/2013  . Dyspnea on exertion 05/14/2013  . DOE (dyspnea on exertion) 04/28/2013  . Personal history of colonic polyps 10/28/2012  . Osteopenia 04/29/2012  .  Elevated digoxin level 04/29/2012  . Allergic rhinitis, cause unspecified 05/22/2011  . Alcohol dependence in remission (HCC) 05/22/2011  . Depression 05/22/2011  . Preventative health care 05/18/2011  . HTN (hypertension) 05/18/2011  . Hyperlipidemia 05/18/2011  . Anemia, iron deficiency 05/18/2011  . GERD (gastroesophageal reflux disease) 05/18/2011  . DDD (degenerative disc disease), lumbar 05/18/2011  . Pectus excavatum 05/18/2011  . Macular degeneration   . OA (osteoarthritis)   . Cholelithiasis   . COPD (chronic obstructive pulmonary disease) (HCC)   . Paroxysmal SVT (supraventricular tachycardia) (HCC) 02/07/2011    PMH: Past Medical History:  Diagnosis Date  . Age-related macular degeneration, wet, both eyes (HCC)   . Alcohol dependence in remission (HCC) 05/22/2011  . Allergic rhinitis, cause unspecified 05/22/2011  . Allergy   . Anemia, iron deficiency 05/18/2011  . Cholelithiasis   . COPD (chronic obstructive pulmonary disease) (HCC)   . DDD (degenerative disc disease), lumbar 05/18/2011  . Depression    "@ times" (09/19/2016)  . First degree AV block   . GERD (gastroesophageal reflux disease)   . Hiatal hernia   . History of blood transfusion    "3 or 4 related to childbirth"  . HTN (hypertension) 05/18/2011   pt denies this hx on 09/19/2016  . Hyperlipidemia 05/18/2011  . Macular degeneration   . Myocardial infarction (HCC)    "EKG shows I've had 2; date unknown" (09/19/2016)  . OA (osteoarthritis)    "a little here and there; mainly in the back" (09/19/2016)  . Osteoporosis 05/18/2011  . PAT (paroxysmal atrial tachycardia) (HCC)   .  Pectus excavatum 05/18/2011  . Personal history of colonic polyps 10/28/2012  . Pneumonia    "as a child"  . SVT (supraventricular tachycardia) (HCC)   . Urinary frequency     PSH: Past Surgical History:  Procedure Laterality Date  . ABDOMINAL HYSTERECTOMY  1988  . BREAST BIOPSY Right 1948   "it was ok"  . CARDIAC  CATHETERIZATION    . CARDIOVASCULAR STRESS TEST  03/08/2009   EF 84%  . CATARACT EXTRACTION W/ INTRAOCULAR LENS  IMPLANT, BILATERAL Bilateral   . INGUINAL HERNIA REPAIR Right 06/23/2017   Procedure: REPAIR INCARCERATED  RIGHT FEMORAL HERNIA WITH MESH;  Surgeon: Harriette Bouillonornett, Thomas, MD;  Location: [facility] OR;  Service: General;  Laterality: Right;  . KNEE ARTHROSCOPY Right   . RIGHT/LEFT HEART CATH AND CORONARY ANGIOGRAPHY N/A 07/25/2016   Procedure: Right/Left Heart Cath and Coronary Angiography;  Surgeon: Kathleene Hazelhristopher D McAlhany, MD;  [facility];  Service: Cardiovascular;  Laterality: N/A;  . SVT ABLATION  09/19/2016  . SVT ABLATION N/A 09/19/2016   Procedure: SVT Ablation;  Surgeon: Hillis RangeJames Allred, MD;  [facility];  Service: Cardiovascular;  Laterality: N/A;  . TONSILLECTOMY AND ADENOIDECTOMY    . US ECHOCARDIOGRAPHY  01/17/2003   EF 55-60%    (Not in a hospital admission)   SH: Social History   Tobacco Use  . Smoking status: Former Smoker    Packs/day: 1.50    Years: 36.00    Pack years: 54.00    Types: Cigarettes    Start date: 03/01/1959    Quit date: 06/10/1994    Years since quitting: 26.2  . Smokeless tobacco: Never Used  Vaping Use  . Vaping Use: Never used  Substance Use Topics  . Alcohol use: No  . Drug use: No    MEDS: Prior to Admission medications   Medication Sig Start Date End Date Taking? Authorizing Provider  acetaminophen (TYLENOL) 500 MG tablet Take 1,000 mg by mouth 2 (two) times daily as needed for moderate pain or headache.    [provider]  ALPRAZolam Prudy Feeler(XANAX) 0.25 MG tablet TAKE ONE TABLET BY MOUTH EVERY NIGHT AT BEDTIME AS NEEDED FOR ANXIETY 12/14/19   [name], [name], MD  amLODipine (NORVASC) 5 MG tablet TAKE ONE TABLET BY MOUTH DAILY 06/28/20   Allred, Fayrene FearingJames, MD  Ascorbic Acid (VITAMIN C PO) Take 1 tablet by mouth daily. Reported on 08/08/2015    [provider]  aspirin 81 MG tablet Take 81 mg by mouth at bedtime.     [provider]  B Complex Vitamins (VITAMIN B COMPLEX PO) Take 1 tablet by mouth daily.    [provider]  budesonide-formoterol (SYMBICORT) 80-4.5 MCG/ACT inhaler INHALE TWO PUFFS BY MOUTH TWICE A DAY *WAIT 20 MINUTES BETWEEN EACH PUFF* 03/09/20   Icard, Elige RadonBradley L, DO  calcium carbonate (OS-CAL) 600 MG tablet Take 600 mg by mouth daily.    [provider]  CARTIA XT 120 MG 24 hr capsule TAKE ONE CAPSULE BY MOUTH DAILY 12/14/19   Allred, Fayrene FearingJames, MD  diltiazem (CARDIZEM) 30 MG tablet Take 1 tablet (30 mg total) by mouth 4 (four) times daily as needed. 01/08/17   Allred, Fayrene FearingJames, MD  furosemide (LASIX) 40 MG tablet TAKE ONE TABLET BY MOUTH DAILY 06/06/20   Jake BatheSkains, Mark C, MD  gabapentin (NEURONTIN) 100 MG capsule TAKE ONE CAPSULE BY MOUTH THREE TIMES A DAY 11/18/19   Kirsteins, Victorino SparrowAndrew E, MD  Glucosamine HCl-MSM (GLUCOSAMINE-MSM PO) Take 2  tablets by mouth daily.     [provider]  nitroGLYCERIN (NITROSTAT) 0.4 MG SL tablet Place 1 tablet (0.4 mg total) under the tongue every 5 (five) minutes as needed for chest pain. 05/03/19   Jake Bathe, MD  omeprazole (PRILOSEC) 40 MG capsule TAKE ONE CAPSULE BY MOUTH DAILY 04/04/20   Hilarie Fredrickson, MD  Respiratory Therapy Supplies (FLUTTER) DEVI Use as directed 12/02/18   Bevelyn Ngo, NP  simvastatin (ZOCOR) 10 MG tablet Take 1 tablet (10 mg total) by mouth daily. 07/10/20   Jake Bathe, MD  traMADol (ULTRAM) 50 MG tablet TAKE ONE TABLET BY MOUTH TWICE A DAY AS NEEDED 08/02/20   Corwin Levins, MD  triamcinolone cream (KENALOG) 0.1 % Apply 1 application topically daily as needed (hives). Apply to affected area 12/16/18   Corwin Levins, MD  vitamin E 200 UNIT capsule Take 200 Units by mouth daily.    [provider]    ALLERGY: No Known Allergies  Social History   Tobacco Use  . Smoking status: Former Smoker    Packs/day: 1.50    Years: 36.00    Pack years: 54.00    Types: Cigarettes    Start date:  03/01/1959    Quit date: 06/10/1994    Years since quitting: 26.2  . Smokeless tobacco: Never Used  Substance Use Topics  . Alcohol use: No     Family History  Problem Relation Age of Onset  . Heart failure Mother   . Arthritis Mother   . Kidney failure Father   . Heart disease Father   . Hypertension Father      ROS   Review of Systems  All other systems reviewed and are negative.   Exam   Vitals:   09/03/20 1740 09/03/20 1954  BP: 131/77 (!) 143/109  Pulse: 71 (!) 109  Resp: (!) 22 (!) 22  Temp: 98.4 F (36.9 C) 97.6 F (36.4 C)  SpO2:  100%   General appearance: elderly female ,resting comfortably, c cillar in place. Abrasion to nose Eyes: No scleral injection Cardiovascular: Regular rate and rhythm without murmurs, rubs, gallops. No edema or variciosities. Distal pulses normal. Pulmonary: Effort normal, non-labored breathing Musculoskeletal:     Muscle tone upper extremities: Normal    Muscle tone lower extremities: Normal    Motor exam: Upper Extremities Deltoid Bicep Tricep Grip  Right 5/5 5/5 5/5 5/5  Left *exam limited  Due to fracture. Able to move well but has pain mediated weakness  3/5 3/5 5/5   Lower Extremity IP Quad PF DF EHL  Right 5/5 5/5 5/5 5/5 5/5  Left 5/5 5/5 5/5 5/5 5/5   Neurological Mental Status:    - Patient is awake, alert, oriented to person, place, month, year, and situation    - Patient is able to give a clear and coherent history.    - No signs of aphasia or neglect Cranial Nerves    - II: Visual Fields are full. PERRL    - III/IV/VI: EOMI without ptosis or diploplia.     - V: Facial sensation is grossly normal    - VII: Facial movement is symmetric.     - VIII: hearing is intact to voice    - X: Uvula elevates symmetrically    - XI: Shoulder shrug is symmetric.    - XII: tongue is midline without atrophy or fasciculations.  Sensory: Sensation grossly intact to LT  Results - Imaging/Labs  Results for orders placed or  performed during the hospital encounter of 09/03/20 (from the past 48 hour(s))  CBC     Status: Abnormal   Collection Time: 09/03/20  6:56 PM  Result Value Ref Range   WBC 17.3 (H) 4.0 - 10.5 K/uL   RBC 5.05 3.87 - 5.11 MIL/uL   Hemoglobin 15.0 12.0 - 15.0 g/dL   HCT 40.9 81.1 - 91.4 %   MCV 86.3 80.0 - 100.0 fL   MCH 29.7 26.0 - 34.0 pg   MCHC 34.4 30.0 - 36.0 g/dL   RDW 78.2 95.6 - 21.3 %   Platelets 359 150 - 400 K/uL   nRBC 0.0 0.0 - 0.2 %    Comment: Performed at [facility], [address] [place], [place] [address]    CT Head Wo Contrast  Result Date: 09/03/2020 CLINICAL DATA:  Fall EXAM: CT HEAD WITHOUT CONTRAST CT CERVICAL SPINE WITHOUT CONTRAST TECHNIQUE: Multidetector CT imaging of the head and cervical spine was performed following the standard protocol without intravenous contrast. Multiplanar CT image reconstructions of the cervical spine were also generated. COMPARISON:  None. FINDINGS: CT HEAD FINDINGS Brain: There is no mass, hemorrhage or extra-axial collection. The size and configuration of the ventricles and extra-axial CSF spaces are normal. The brain parenchyma is normal, without evidence of acute or chronic infarction. Vascular: Atherosclerotic calcification of the internal carotid arteries at the skull base. No abnormal hyperdensity of the major intracranial arteries or dural venous sinuses. Skull: The visualized skull base, calvarium and extracranial soft tissues are normal. Sinuses/Orbits: No fluid levels or advanced mucosal thickening of the visualized paranasal sinuses. No mastoid or middle ear effusion. The orbits are normal. CT CERVICAL SPINE FINDINGS Alignment: Grade 1 anterolisthesis at C4-5 and C5-6 Skull base and vertebrae: There is hypertrophy and mineralization at the C1-2 level. There is a large, rounded area of lucency through the odontoid process. There is a type 2 fracture through this region with minimal displacement. Additionally, there is a  minimally displaced fracture of the midline anterior arch of C1. there is a compression fracture of T2 with approximately 25% height loss but no retropulsion. Soft tissues and spinal canal: No prevertebral fluid or swelling. No visible canal hematoma. Disc levels: No bony spinal canal stenosis Upper chest: No pneumothorax, pulmonary nodule or pleural effusion. Other: Normal visualized paraspinal cervical soft tissues. IMPRESSION: 1. Minimally displaced anterior C1 arch and type 2 odontoid process fractures. 2. No acute intracranial abnormality. 3. Moderate hypertrophic arthritic change at the atlantoaxial joint, which could be due to rheumatoid arthritis, gout or CPPD. 4. T2 compression fracture with 25% height loss but no retropulsion. Critical Value/emergent results were called by telephone at the time of interpretation on 09/03/2020 at 8:09 pm to provider MATTHEW TRIFAN , who verbally acknowledged these results. Electronically Signed   By: Deatra Robinson M.D.   On: 09/03/2020 20:10   CT Chest Wo Contrast  Result Date: 09/03/2020 CLINICAL DATA:  Recent fall with known proximal left humeral fracture and chest pain, evaluate for possible rib fracture EXAM: CT CHEST WITHOUT CONTRAST TECHNIQUE: Multidetector CT imaging of the chest was performed following the standard protocol without IV contrast. COMPARISON:  12/28/2018 FINDINGS: Cardiovascular: Thoracic aorta and its branches demonstrate diffuse atherosclerotic calcifications. No aneurysmal dilatation is identified. Coronary calcifications are seen. Pulmonary artery is not significantly enlarged. Mediastinum/Nodes: Thoracic inlet is within normal limits with the exception of a 2 cm hypodense nodule within the right lobe of the thyroid bridging  into the isthmus. No sizable hilar or mediastinal adenopathy is noted. No esophageal abnormality is seen. Lungs/Pleura: Lungs are well aerated bilaterally. No focal infiltrate or sizable effusion is seen. Minimal right  basilar atelectasis is noted. No sizable effusion or pneumothorax is seen. Upper Abdomen: Renal cystic change is noted and stable from the prior exam. Remainder of the upper abdomen appears within normal limits. Musculoskeletal: Compression deformity is noted at T2 which is new from the prior exam of 2020 but of uncertain chronicity. No other compression deformities are seen. Sternum is unremarkable. Comminuted proximal left humeral fracture is again seen stable from prior plain films. No acute rib fracture is noted. IMPRESSION: Stable left humeral fracture. No definitive rib abnormality is seen. Chronic appearing T2 compression fracture. This is new from prior exam of 2020 but of uncertain chronicity. Correlation to point tenderness is recommended. 2 cm hypodense nodule within the right lobe of the thyroid. This is stable from a prior exam of 2020 and given the patient's age no follow-up recommended unless clinically warranted (ref: J Am Coll Radiol. 2015 Feb;12(2): 143-50). Aortic Atherosclerosis (ICD10-I70.0). Electronically Signed   By: Alcide Clever M.D.   On: 09/03/2020 19:58   CT Cervical Spine Wo Contrast  Result Date: 09/03/2020 CLINICAL DATA:  Fall EXAM: CT HEAD WITHOUT CONTRAST CT CERVICAL SPINE WITHOUT CONTRAST TECHNIQUE: Multidetector CT imaging of the head and cervical spine was performed following the standard protocol without intravenous contrast. Multiplanar CT image reconstructions of the cervical spine were also generated. COMPARISON:  None. FINDINGS: CT HEAD FINDINGS Brain: There is no mass, hemorrhage or extra-axial collection. The size and configuration of the ventricles and extra-axial CSF spaces are normal. The brain parenchyma is normal, without evidence of acute or chronic infarction. Vascular: Atherosclerotic calcification of the internal carotid arteries at the skull base. No abnormal hyperdensity of the major intracranial arteries or dural venous sinuses. Skull: The visualized skull  base, calvarium and extracranial soft tissues are normal. Sinuses/Orbits: No fluid levels or advanced mucosal thickening of the visualized paranasal sinuses. No mastoid or middle ear effusion. The orbits are normal. CT CERVICAL SPINE FINDINGS Alignment: Grade 1 anterolisthesis at C4-5 and C5-6 Skull base and vertebrae: There is hypertrophy and mineralization at the C1-2 level. There is a large, rounded area of lucency through the odontoid process. There is a type 2 fracture through this region with minimal displacement. Additionally, there is a minimally displaced fracture of the midline anterior arch of C1. there is a compression fracture of T2 with approximately 25% height loss but no retropulsion. Soft tissues and spinal canal: No prevertebral fluid or swelling. No visible canal hematoma. Disc levels: No bony spinal canal stenosis Upper chest: No pneumothorax, pulmonary nodule or pleural effusion. Other: Normal visualized paraspinal cervical soft tissues. IMPRESSION: 1. Minimally displaced anterior C1 arch and type 2 odontoid process fractures. 2. No acute intracranial abnormality. 3. Moderate hypertrophic arthritic change at the atlantoaxial joint, which could be due to rheumatoid arthritis, gout or CPPD. 4. T2 compression fracture with 25% height loss but no retropulsion. Critical Value/emergent results were called by telephone at the time of interpretation on 09/03/2020 at 8:09 pm to provider MATTHEW TRIFAN , who verbally acknowledged these results. Electronically Signed   By: Deatra Robinson M.D.   On: 09/03/2020 20:10   CT HUMERUS LEFT WO CONTRAST  Result Date: 09/03/2020 CLINICAL DATA:  Evaluate right proximal humeral fracture EXAM: CT OF THE UPPER LEFT EXTREMITY WITHOUT CONTRAST TECHNIQUE: Multidetector CT imaging of the upper left  extremity was performed according to the standard protocol. COMPARISON:  X-ray 09/03/2020 FINDINGS: Bones/Joint/Cartilage Acute comminuted fracture of the left humeral head and  neck. Transverse component of the surgical neck with approximately 1.2 cm of anterior displacement. Greater tuberosity fracture component with approximately 0.6 cm of posterosuperior displacement. Minimally displaced fracture involves the inferior margin of the lesser tuberosity. No fracture involvement of the humeral head articular surface. Inferior subluxation of the humeral head likely secondary to space-occupying joint effusion/hemarthrosis. No dislocation. No additional fractures. Mild glenohumeral and acromioclavicular osteoarthritis. Elbow joint intact without fracture or dislocation. Ligaments Suboptimally assessed by CT. Muscles and Tendons Preserved bulk of the rotator cuff musculature. Limited evaluation of the rotator cuff tendons. Soft tissues Prominent soft tissue swelling at the fracture site. No organized fluid collection/hematoma. Visualized portion of the left lung field is clear. IMPRESSION: 1. Acute comminuted fracture of the left humeral head and neck, as described above. 2. Inferior subluxation of the humeral head likely secondary to space-occupying joint effusion/hemarthrosis. Electronically Signed   By: Duanne Guess D.O.   On: 09/03/2020 20:04   DG Shoulder Left  Result Date: 09/03/2020 CLINICAL DATA:  Status post fall while raking leaves. EXAM: LEFT SHOULDER - 2+ VIEW COMPARISON:  None FINDINGS: There is an acute, comminuted impacted fracture deformity involving surgical neck and greater tuberosity of the proximal left humerus. No signs of dislocation. Overlying soft tissue swelling noted. IMPRESSION: Acute, comminuted impacted fracture deformity involving the surgical neck and greater tuberosity of the proximal left humerus. Electronically Signed   By: Signa Kell M.D.   On: 09/03/2020 18:34   DG Humerus Left  Result Date: 09/03/2020 CLINICAL DATA:  Fall raking leaves today.  Left shoulder pain. EXAM: LEFT HUMERUS - 2+ VIEW COMPARISON:  None. FINDINGS: Comminuted and  displaced proximal humerus fracture. Dominant fracture plane is through the surgical neck. There is displacement of the greater trochanter of 4 mm. No definite glenohumeral extension. Distal humerus is intact. IMPRESSION: Comminuted and displaced proximal humerus fracture involving the surgical neck and greater trochanter. Electronically Signed   By: Narda Rutherford M.D.   On: 09/03/2020 18:32   Impression/Plan   87 y.o. female with multiple injuries after a fall earlier today including C1, C2 and ?T2 fracture as well as left humerus fracture. With exception of pain mediated weakness LUE, she is otherwise neurologically intact.  C1 anterior arch fracture, type II odontoid fracture, T2 compression fracture likely chronic - stable fractures that do that require operative stabilization. These fractures should hopefully heal with bracing. - Aspen c collar has already been placed by EDP. This is to be worn at all times including showering. - I would like to follow up with her in 2 weeks for repeat Xrays for monitoring.  Please call for any concerns.  Cindra Presume, PA-C [facility]

## 2020-09-03 NOTE — ED Notes (Signed)
Provider assisted this rn with placing c-collar on pt

## 2020-09-03 NOTE — ED Triage Notes (Signed)
Pt slipped on acorn and fell while doing yard work just [employer]. Hit head.  No blood thinners.  C/o L shoulder, L upper arm, and L elbow pain.  Abrasion to nose.  Denies LOC.  Denies neck and back pain.

## 2020-09-03 NOTE — ED Notes (Signed)
Messaged provider in reference to pt requesting pain medication and to sit up in the bed

## 2020-09-03 NOTE — Progress Notes (Signed)
Orthopedic Tech Progress Note Patient Details:  Alexandra Reyes 08/02/1925 [id number]  Ortho Devices Type of Ortho Device: Sling immobilizer Ortho Device/Splint Location: lue Ortho Device/Splint Interventions: Ordered,Application,Adjustment   Post Interventions Patient Tolerated: Well Instructions Provided: Care of device,Adjustment of device   [name] 09/03/2020, 9:44 PM

## 2020-09-03 NOTE — ED Notes (Signed)
Provider at bedside

## 2020-09-04 ENCOUNTER — Encounter (HOSPITAL_COMMUNITY): Payer: Self-pay | Admitting: Internal Medicine

## 2020-09-04 DIAGNOSIS — Y92017 Garden or yard in single-family (private) house as the place of occurrence of the external cause: Secondary | ICD-10-CM | POA: Diagnosis not present

## 2020-09-04 DIAGNOSIS — Z8781 Personal history of (healed) traumatic fracture: Secondary | ICD-10-CM | POA: Diagnosis not present

## 2020-09-04 DIAGNOSIS — E871 Hypo-osmolality and hyponatremia: Secondary | ICD-10-CM | POA: Diagnosis not present

## 2020-09-04 DIAGNOSIS — S22089A Unspecified fracture of T11-T12 vertebra, initial encounter for closed fracture: Secondary | ICD-10-CM | POA: Diagnosis present

## 2020-09-04 DIAGNOSIS — I471 Supraventricular tachycardia: Secondary | ICD-10-CM | POA: Diagnosis not present

## 2020-09-04 DIAGNOSIS — Z841 Family history of disorders of kidney and ureter: Secondary | ICD-10-CM | POA: Diagnosis not present

## 2020-09-04 DIAGNOSIS — S42212A Unspecified displaced fracture of surgical neck of left humerus, initial encounter for closed fracture: Secondary | ICD-10-CM | POA: Insufficient documentation

## 2020-09-04 DIAGNOSIS — J849 Interstitial pulmonary disease, unspecified: Secondary | ICD-10-CM | POA: Diagnosis present

## 2020-09-04 DIAGNOSIS — Z96612 Presence of left artificial shoulder joint: Secondary | ICD-10-CM | POA: Diagnosis not present

## 2020-09-04 DIAGNOSIS — I2510 Atherosclerotic heart disease of native coronary artery without angina pectoris: Secondary | ICD-10-CM | POA: Diagnosis present

## 2020-09-04 DIAGNOSIS — I252 Old myocardial infarction: Secondary | ICD-10-CM | POA: Diagnosis not present

## 2020-09-04 DIAGNOSIS — I509 Heart failure, unspecified: Secondary | ICD-10-CM | POA: Diagnosis present

## 2020-09-04 DIAGNOSIS — Z20822 Contact with and (suspected) exposure to covid-19: Secondary | ICD-10-CM | POA: Diagnosis present

## 2020-09-04 DIAGNOSIS — S12110A Anterior displaced Type II dens fracture, initial encounter for closed fracture: Secondary | ICD-10-CM | POA: Diagnosis present

## 2020-09-04 DIAGNOSIS — K219 Gastro-esophageal reflux disease without esophagitis: Secondary | ICD-10-CM | POA: Diagnosis not present

## 2020-09-04 DIAGNOSIS — Z79899 Other long term (current) drug therapy: Secondary | ICD-10-CM | POA: Diagnosis not present

## 2020-09-04 DIAGNOSIS — S42242A 4-part fracture of surgical neck of left humerus, initial encounter for closed fracture: Secondary | ICD-10-CM | POA: Diagnosis not present

## 2020-09-04 DIAGNOSIS — Z471 Aftercare following joint replacement surgery: Secondary | ICD-10-CM | POA: Diagnosis not present

## 2020-09-04 DIAGNOSIS — S129XXD Fracture of neck, unspecified, subsequent encounter: Secondary | ICD-10-CM | POA: Diagnosis not present

## 2020-09-04 DIAGNOSIS — Z8249 Family history of ischemic heart disease and other diseases of the circulatory system: Secondary | ICD-10-CM | POA: Diagnosis not present

## 2020-09-04 DIAGNOSIS — Z7951 Long term (current) use of inhaled steroids: Secondary | ICD-10-CM | POA: Diagnosis not present

## 2020-09-04 DIAGNOSIS — S0031XA Abrasion of nose, initial encounter: Secondary | ICD-10-CM | POA: Diagnosis present

## 2020-09-04 DIAGNOSIS — Z96642 Presence of left artificial hip joint: Secondary | ICD-10-CM | POA: Diagnosis not present

## 2020-09-04 DIAGNOSIS — Z8261 Family history of arthritis: Secondary | ICD-10-CM | POA: Diagnosis not present

## 2020-09-04 DIAGNOSIS — W010XXA Fall on same level from slipping, tripping and stumbling without subsequent striking against object, initial encounter: Secondary | ICD-10-CM | POA: Diagnosis present

## 2020-09-04 DIAGNOSIS — M542 Cervicalgia: Secondary | ICD-10-CM | POA: Diagnosis present

## 2020-09-04 DIAGNOSIS — E785 Hyperlipidemia, unspecified: Secondary | ICD-10-CM | POA: Diagnosis present

## 2020-09-04 DIAGNOSIS — Z87891 Personal history of nicotine dependence: Secondary | ICD-10-CM | POA: Diagnosis not present

## 2020-09-04 DIAGNOSIS — S22000A Wedge compression fracture of unspecified thoracic vertebra, initial encounter for closed fracture: Secondary | ICD-10-CM | POA: Diagnosis not present

## 2020-09-04 DIAGNOSIS — J449 Chronic obstructive pulmonary disease, unspecified: Secondary | ICD-10-CM | POA: Diagnosis not present

## 2020-09-04 DIAGNOSIS — S129XXA Fracture of neck, unspecified, initial encounter: Secondary | ICD-10-CM

## 2020-09-04 DIAGNOSIS — Z7982 Long term (current) use of aspirin: Secondary | ICD-10-CM | POA: Diagnosis not present

## 2020-09-04 DIAGNOSIS — S12000A Unspecified displaced fracture of first cervical vertebra, initial encounter for closed fracture: Secondary | ICD-10-CM | POA: Insufficient documentation

## 2020-09-04 DIAGNOSIS — J479 Bronchiectasis, uncomplicated: Secondary | ICD-10-CM | POA: Diagnosis not present

## 2020-09-04 DIAGNOSIS — F1021 Alcohol dependence, in remission: Secondary | ICD-10-CM | POA: Diagnosis present

## 2020-09-04 DIAGNOSIS — S12090A Other displaced fracture of first cervical vertebra, initial encounter for closed fracture: Secondary | ICD-10-CM | POA: Diagnosis present

## 2020-09-04 DIAGNOSIS — I110 Hypertensive heart disease with heart failure: Secondary | ICD-10-CM | POA: Diagnosis present

## 2020-09-04 LAB — CBC
HCT: 39.3 % (ref 36.0–46.0)
Hemoglobin: 13.6 g/dL (ref 12.0–15.0)
MCH: 30.1 pg (ref 26.0–34.0)
MCHC: 34.6 g/dL (ref 30.0–36.0)
MCV: 86.9 fL (ref 80.0–100.0)
Platelets: 317 10*3/uL (ref 150–400)
RBC: 4.52 MIL/uL (ref 3.87–5.11)
RDW: 14.4 % (ref 11.5–15.5)
WBC: 12.1 10*3/uL — ABNORMAL HIGH (ref 4.0–10.5)
nRBC: 0 % (ref 0.0–0.2)

## 2020-09-04 LAB — BASIC METABOLIC PANEL
Anion gap: 10 (ref 5–15)
BUN: 13 mg/dL (ref 8–23)
CO2: 18 mmol/L — ABNORMAL LOW (ref 22–32)
Calcium: 8.5 mg/dL — ABNORMAL LOW (ref 8.9–10.3)
Chloride: 99 mmol/L (ref 98–111)
Creatinine, Ser: 0.79 mg/dL (ref 0.44–1.00)
GFR, Estimated: >60 mL/min (ref >60)
Glucose, Bld: 118 mg/dL — ABNORMAL HIGH (ref 70–99)
Potassium: 3.7 mmol/L (ref 3.5–5.1)
Sodium: 127 mmol/L — ABNORMAL LOW (ref 135–145)

## 2020-09-04 LAB — SURGICAL PCR SCREEN
MRSA, PCR: NEGATIVE
Staphylococcus aureus: NEGATIVE

## 2020-09-04 MED ORDER — FUROSEMIDE 40 MG PO TABS
40.0000 mg | ORAL_TABLET | Freq: Every day | ORAL | Status: DC
  Administered 2020-09-04: 40 mg via ORAL
  Filled 2020-09-04: qty 1

## 2020-09-04 MED ORDER — NITROGLYCERIN 0.4 MG SL SUBL: 0.4000 mg | SUBLINGUAL_TABLET | SUBLINGUAL | Status: DC | PRN

## 2020-09-04 MED ORDER — SIMVASTATIN 20 MG PO TABS
10.0000 mg | ORAL_TABLET | Freq: Every day | ORAL | Status: DC
Start: 2020-09-04 — End: 2020-09-07
  Administered 2020-09-04: 10 mg via ORAL
  Filled 2020-09-04 (×3): qty 1

## 2020-09-04 MED ORDER — ALPRAZOLAM 0.25 MG PO TABS
0.2500 mg | ORAL_TABLET | Freq: Once | ORAL | Status: AC
  Administered 2020-09-04: 0.25 mg via ORAL
  Filled 2020-09-04: qty 1

## 2020-09-04 MED ORDER — ALPRAZOLAM 0.25 MG PO TABS
0.2500 mg | ORAL_TABLET | Freq: Every evening | ORAL | Status: DC | PRN
  Administered 2020-09-05 – 2020-09-06 (×2): 0.25 mg via ORAL
  Filled 2020-09-04 (×2): qty 1

## 2020-09-04 MED ORDER — GABAPENTIN 100 MG PO CAPS
100.0000 mg | ORAL_CAPSULE | Freq: Three times a day (TID) | ORAL | Status: DC
  Administered 2020-09-04 – 2020-09-07 (×9): 100 mg via ORAL
  Filled 2020-09-04 (×9): qty 1

## 2020-09-04 MED ORDER — MOMETASONE FURO-FORMOTEROL FUM 100-5 MCG/ACT IN AERO
2.0000 | INHALATION_SPRAY | Freq: Two times a day (BID) | RESPIRATORY_TRACT | Status: DC
  Administered 2020-09-04 – 2020-09-07 (×7): 2 via RESPIRATORY_TRACT
  Filled 2020-09-04: qty 8.8

## 2020-09-04 MED ORDER — SODIUM CHLORIDE 0.9 % IV SOLN: INTRAVENOUS | Status: DC

## 2020-09-04 MED ORDER — ENSURE PRE-SURGERY PO LIQD
296.0000 mL | Freq: Once | ORAL | Status: AC
  Administered 2020-09-04: 296 mL via ORAL
  Filled 2020-09-04: qty 296

## 2020-09-04 MED ORDER — LIP MEDEX EX OINT
TOPICAL_OINTMENT | CUTANEOUS | Status: DC | PRN
  Filled 2020-09-04: qty 7

## 2020-09-04 MED ORDER — MUPIROCIN 2 % EX OINT
1.0000 "application " | TOPICAL_OINTMENT | Freq: Two times a day (BID) | CUTANEOUS | Status: DC
  Administered 2020-09-04 – 2020-09-07 (×7): 1 via NASAL
  Filled 2020-09-04: qty 22

## 2020-09-04 MED ORDER — POVIDONE-IODINE 10 % EX SWAB
2.0000 "application " | Freq: Once | CUTANEOUS | Status: AC
  Administered 2020-09-05: 2 via TOPICAL

## 2020-09-04 MED ORDER — DILTIAZEM HCL ER COATED BEADS 120 MG PO CP24
120.0000 mg | ORAL_CAPSULE | Freq: Every day | ORAL | Status: DC
  Administered 2020-09-04 – 2020-09-07 (×4): 120 mg via ORAL
  Filled 2020-09-04 (×4): qty 1

## 2020-09-04 MED ORDER — CHLORHEXIDINE GLUCONATE 4 % EX LIQD
60.0000 mL | Freq: Once | CUTANEOUS | Status: AC
  Administered 2020-09-05: 4 via TOPICAL
  Filled 2020-09-04 (×2): qty 60

## 2020-09-04 MED ORDER — PANTOPRAZOLE SODIUM 40 MG PO TBEC
40.0000 mg | DELAYED_RELEASE_TABLET | Freq: Every day | ORAL | Status: DC
  Administered 2020-09-04 – 2020-09-07 (×4): 40 mg via ORAL
  Filled 2020-09-04 (×4): qty 1

## 2020-09-04 MED ORDER — AMLODIPINE BESYLATE 5 MG PO TABS
5.0000 mg | ORAL_TABLET | Freq: Every day | ORAL | Status: DC
  Administered 2020-09-04 – 2020-09-07 (×4): 5 mg via ORAL
  Filled 2020-09-04 (×4): qty 1

## 2020-09-04 MED ORDER — CEFAZOLIN SODIUM-DEXTROSE 2-4 GM/100ML-% IV SOLN
2.0000 g | INTRAVENOUS | Status: AC
  Administered 2020-09-05: 2 g via INTRAVENOUS
  Filled 2020-09-04: qty 100

## 2020-09-04 NOTE — TOC CAGE-AID Note (Signed)
Transition of Care [facility]) - CAGE-AID Screening   Patient Details  Name: Alexandra Reyes MRN: [id number] Date of Birth: 07/30/1925  Transition of Care [facility]) CM/SW Contact:    Carley Hammed, LCSWA Phone Number: 09/04/2020, 10:19 AM   Clinical Narrative: CSW completed assessment with pt who noted no current issues with drug or alcohol and denied use at the time of her injury. Pt did note that she attended AA and has been sober for 42 years. CSW congratulated her on this achievement and pt denied need for further resources.   CAGE-AID Screening:    Have You Ever Felt You Ought to Cut Down on Your Drinking or Drug Use?: No Have People Annoyed You By Critizing Your Drinking Or Drug Use?: No Have You Felt Bad Or Guilty About Your Drinking Or Drug Use?: No Have You Ever Had a Drink or Used Drugs First Thing In The Morning to Steady Your Nerves or to Get Rid of a Hangover?: No CAGE-AID Score: 0  Substance Abuse Education Offered: No (Patient declines at this time)

## 2020-09-04 NOTE — Progress Notes (Signed)
  NEUROSURGERY PROGRESS NOTE   No issues overnight.  Neck pain manageable No N/T in BUE  EXAM:  BP 131/78 (BP Location: Left Arm)   Pulse 98   Temp 98.2 F (36.8 C) (Oral)   Resp 18   Ht 5\' 3"  (1.6 m)   Wt 56.2 kg   LMP  (LMP Unknown)   SpO2 97%   BMI 21.97 kg/m   Awake, alert, oriented  c collar in place Speech fluent, appropriate  CN grossly intact  5/5 RUE/BLE LUE sling. 5/5 grip  IMPRESSION/PLAN 87 y.o. female s/p fall with C1 anterior arch fracture, type II odontoid fracture & T2 compression fracture likely chronic after fall. Remains neuro intact. No new NS recs. Continue C collar at all times. F/U 2 weeks outpatient for monitoring. Please call for any concerns.

## 2020-09-04 NOTE — Consult Note (Signed)
Reason for Consult:Left humerus fx Referring Physician: Arvilla MarketA Kakrakandy Time called: 1324: 0922 Time at bedside: 0935   Alexandra Reyes is an 87 y.o. female.  HPI: Alexandra FriedlanderFlorence was out working in her yard yesterday. She was trying to move a large branch, turned, lost her balance, and fell. She had immediate pain in her shoulder and neck. She was brought to the ED where x-rays showed a humeral head fx and some vertebral fxs. She was admitted and orthopedic surgery was consulted. She lives alone at home and does not use any assistive devices to ambulate. She is RHD.  Past Medical History:  Diagnosis Date  . Age-related macular degeneration, wet, both eyes (HCC)   . Alcohol dependence in remission (HCC) 05/22/2011  . Allergic rhinitis, cause unspecified 05/22/2011  . Allergy   . Anemia, iron deficiency 05/18/2011  . Cholelithiasis   . COPD (chronic obstructive pulmonary disease) (HCC)   . DDD (degenerative disc disease), lumbar 05/18/2011  . Depression    "@ times" (09/19/2016)  . First degree AV block   . GERD (gastroesophageal reflux disease)   . Hiatal hernia   . History of blood transfusion    "3 or 4 related to childbirth"  . HTN (hypertension) 05/18/2011   pt denies this hx on 09/19/2016  . Hyperlipidemia 05/18/2011  . Macular degeneration   . Myocardial infarction (HCC)    "EKG shows I've had 2; date unknown" (09/19/2016)  . OA (osteoarthritis)    "a little here and there; mainly in the back" (09/19/2016)  . Osteoporosis 05/18/2011  . PAT (paroxysmal atrial tachycardia) (HCC)   . Pectus excavatum 05/18/2011  . Personal history of colonic polyps 10/28/2012  . Pneumonia    "as a child"  . SVT (supraventricular tachycardia) (HCC)   . Urinary frequency     Past Surgical History:  Procedure Laterality Date  . ABDOMINAL HYSTERECTOMY  1988  . BREAST BIOPSY Right 1948   "it was ok"  . CARDIAC CATHETERIZATION    . CARDIOVASCULAR STRESS TEST  03/08/2009   EF 84%  . CATARACT EXTRACTION W/  INTRAOCULAR LENS  IMPLANT, BILATERAL Bilateral   . INGUINAL HERNIA REPAIR Right 06/23/2017   Procedure: REPAIR INCARCERATED  RIGHT FEMORAL HERNIA WITH MESH;  Surgeon: Harriette Bouillonornett, Thomas, MD;  Location: [facility] OR;  Service: General;  Laterality: Right;  . KNEE ARTHROSCOPY Right   . RIGHT/LEFT HEART CATH AND CORONARY ANGIOGRAPHY N/A 07/25/2016   Procedure: Right/Left Heart Cath and Coronary Angiography;  Surgeon: Kathleene Hazelhristopher D McAlhany, MD;  [facility];  Service: Cardiovascular;  Laterality: N/A;  . SVT ABLATION  09/19/2016  . SVT ABLATION N/A 09/19/2016   Procedure: SVT Ablation;  Surgeon: Hillis RangeJames Allred, MD;  [facility];  Service: Cardiovascular;  Laterality: N/A;  . TONSILLECTOMY AND ADENOIDECTOMY    . US ECHOCARDIOGRAPHY  01/17/2003   EF 55-60%    Family History  Problem Relation Age of Onset  . Heart failure Mother   . Arthritis Mother   . Kidney failure Father   . Heart disease Father   . Hypertension Father     Social History:  reports that she quit smoking about 26 years ago. Her smoking use included cigarettes. She started smoking about 61 years ago. She has a 54.00 pack-year smoking history. She has never used smokeless tobacco. She reports that she does not drink alcohol and does not use drugs.  Allergies: No Known Allergies  Medications: I have reviewed the patient's current medications.  Results  for orders placed or performed during the hospital encounter of 09/03/20 (from the past 48 hour(s))  Basic metabolic panel     Status: Abnormal   Collection Time: 09/03/20  6:56 PM  Result Value Ref Range   Sodium 126 (L) 135 - 145 mmol/L   Potassium 5.6 (H) 3.5 - 5.1 mmol/L    Comment: SPECIMEN HEMOLYZED. HEMOLYSIS MAY AFFECT INTEGRITY OF RESULTS.   Chloride 95 (L) 98 - 111 mmol/L   CO2 17 (L) 22 - 32 mmol/L   Glucose, Bld 110 (H) 70 - 99 mg/dL    Comment: Glucose reference range applies only to samples taken after fasting for at least 8 hours.   BUN  17 8 - 23 mg/dL   Creatinine, Ser 0.35 0.44 - 1.00 mg/dL   Calcium 8.8 (L) 8.9 - 10.3 mg/dL   GFR, Estimated 57 (L) >60 mL/min    Comment: (NOTE) Calculated using the CKD-EPI Creatinine Equation (2021)    Anion gap 14 5 - 15    Comment: Performed at [facility], [address] [place], [place] [address]  CBC     Status: Abnormal   Collection Time: 09/03/20  6:56 PM  Result Value Ref Range   WBC 17.3 (H) 4.0 - 10.5 K/uL   RBC 5.05 3.87 - 5.11 MIL/uL   Hemoglobin 15.0 12.0 - 15.0 g/dL   HCT 18.2 99.3 - 71.6 %   MCV 86.3 80.0 - 100.0 fL   MCH 29.7 26.0 - 34.0 pg   MCHC 34.4 30.0 - 36.0 g/dL   RDW 96.7 89.3 - 81.0 %   Platelets 359 150 - 400 K/uL   nRBC 0.0 0.0 - 0.2 %    Comment: Performed at [facility], [address], [place], [place] [address]  Resp Panel by RT-PCR (Flu A&B, Covid) Nasopharyngeal Swab     Status: None   Collection Time: 09/03/20  8:35 PM   Specimen: Nasopharyngeal Swab; Nasopharyngeal(NP) swabs in vial transport medium  Result Value Ref Range   SARS Coronavirus 2 by RT PCR NEGATIVE NEGATIVE    Comment: (NOTE) SARS-CoV-2 target nucleic acids are NOT DETECTED.  The SARS-CoV-2 RNA is generally detectable in upper respiratory specimens during the acute phase of infection. The lowest concentration of SARS-CoV-2 viral copies this assay can detect is 138 copies/mL. A negative result does not preclude SARS-Cov-2 infection and should not be used as the sole basis for treatment or other patient management decisions. A negative result may occur with  improper specimen collection/handling, submission of specimen other than nasopharyngeal swab, presence of viral mutation(s) within the areas targeted by this assay, and inadequate number of viral copies(<138 copies/mL). A negative result must be combined with clinical observations, patient history, and epidemiological information. The expected result is Negative.  Fact Sheet for Patients:   BloggerCourse.com  Fact Sheet for Healthcare Providers:  SeriousBroker.it  This test is no t yet approved or cleared by the [place] FDA and  has been authorized for detection and/or diagnosis of SARS-CoV-2 by FDA under an Emergency Use Authorization (EUA). This EUA will remain  in effect (meaning this test can be used) for the duration of the COVID-19 declaration under Section 564(b)(1) of the Act, 21 U.S.C.section 360bbb-3(b)(1), unless the authorization is terminated  or revoked sooner.       Influenza A by PCR NEGATIVE NEGATIVE   Influenza B by PCR NEGATIVE NEGATIVE    Comment: (NOTE) The Xpert Xpress SARS-CoV-2/FLU/RSV plus assay is intended  as an aid in the diagnosis of influenza from Nasopharyngeal swab specimens and should not be used as a sole basis for treatment. Nasal washings and aspirates are unacceptable for Xpert Xpress SARS-CoV-2/FLU/RSV testing.  Fact Sheet for Patients: BloggerCourse.com  Fact Sheet for Healthcare Providers: SeriousBroker.it  This test is not yet approved or cleared by the [place] FDA and has been authorized for detection and/or diagnosis of SARS-CoV-2 by FDA under an Emergency Use Authorization (EUA). This EUA will remain in effect (meaning this test can be used) for the duration of the COVID-19 declaration under Section 564(b)(1) of the Act, 21 U.S.C. section 360bbb-3(b)(1), unless the authorization is terminated or revoked.  Performed at [facility], [address], [place], [place] [address]   Basic metabolic panel     Status: Abnormal   Collection Time: 09/04/20  4:36 AM  Result Value Ref Range   Sodium 127 (L) 135 - 145 mmol/L   Potassium 3.7 3.5 - 5.1 mmol/L   Chloride 99 98 - 111 mmol/L   CO2 18 (L) 22 - 32 mmol/L   Glucose, Bld 118 (H) 70 - 99 mg/dL    Comment: Glucose reference range applies only to samples  taken after fasting for at least 8 hours.   BUN 13 8 - 23 mg/dL   Creatinine, Ser 6.21 0.44 - 1.00 mg/dL   Calcium 8.5 (L) 8.9 - 10.3 mg/dL   GFR, Estimated >30 >86 mL/min    Comment: (NOTE) Calculated using the CKD-EPI Creatinine Equation (2021)    Anion gap 10 5 - 15    Comment: Performed at [facility], [address] [place], [place] [address]  CBC     Status: Abnormal   Collection Time: 09/04/20  4:36 AM  Result Value Ref Range   WBC 12.1 (H) 4.0 - 10.5 K/uL   RBC 4.52 3.87 - 5.11 MIL/uL   Hemoglobin 13.6 12.0 - 15.0 g/dL   HCT 96.2 95.2 - 84.1 %   MCV 86.9 80.0 - 100.0 fL   MCH 30.1 26.0 - 34.0 pg   MCHC 34.6 30.0 - 36.0 g/dL   RDW 32.4 40.1 - 02.7 %   Platelets 317 150 - 400 K/uL   nRBC 0.0 0.0 - 0.2 %    Comment: Performed at [facility], [address] [place], [place] [address]    CT Head Wo Contrast  Result Date: 09/03/2020 CLINICAL DATA:  Fall EXAM: CT HEAD WITHOUT CONTRAST CT CERVICAL SPINE WITHOUT CONTRAST TECHNIQUE: Multidetector CT imaging of the head and cervical spine was performed following the standard protocol without intravenous contrast. Multiplanar CT image reconstructions of the cervical spine were also generated. COMPARISON:  None. FINDINGS: CT HEAD FINDINGS Brain: There is no mass, hemorrhage or extra-axial collection. The size and configuration of the ventricles and extra-axial CSF spaces are normal. The brain parenchyma is normal, without evidence of acute or chronic infarction. Vascular: Atherosclerotic calcification of the internal carotid arteries at the skull base. No abnormal hyperdensity of the major intracranial arteries or dural venous sinuses. Skull: The visualized skull base, calvarium and extracranial soft tissues are normal. Sinuses/Orbits: No fluid levels or advanced mucosal thickening of the visualized paranasal sinuses. No mastoid or middle ear effusion. The orbits are normal. CT CERVICAL SPINE FINDINGS Alignment: Grade 1  anterolisthesis at C4-5 and C5-6 Skull base and vertebrae: There is hypertrophy and mineralization at the C1-2 level. There is a large, rounded area of lucency through the odontoid process.  There is a type 2 fracture through this region with minimal displacement. Additionally, there is a minimally displaced fracture of the midline anterior arch of C1. there is a compression fracture of T2 with approximately 25% height loss but no retropulsion. Soft tissues and spinal canal: No prevertebral fluid or swelling. No visible canal hematoma. Disc levels: No bony spinal canal stenosis Upper chest: No pneumothorax, pulmonary nodule or pleural effusion. Other: Normal visualized paraspinal cervical soft tissues. IMPRESSION: 1. Minimally displaced anterior C1 arch and type 2 odontoid process fractures. 2. No acute intracranial abnormality. 3. Moderate hypertrophic arthritic change at the atlantoaxial joint, which could be due to rheumatoid arthritis, gout or CPPD. 4. T2 compression fracture with 25% height loss but no retropulsion. Critical Value/emergent results were called by telephone at the time of interpretation on 09/03/2020 at 8:09 pm to provider MATTHEW TRIFAN , who verbally acknowledged these results. Electronically Signed   By: Deatra Robinson M.D.   On: 09/03/2020 20:10   CT Chest Wo Contrast  Result Date: 09/03/2020 CLINICAL DATA:  Recent fall with known proximal left humeral fracture and chest pain, evaluate for possible rib fracture EXAM: CT CHEST WITHOUT CONTRAST TECHNIQUE: Multidetector CT imaging of the chest was performed following the standard protocol without IV contrast. COMPARISON:  12/28/2018 FINDINGS: Cardiovascular: Thoracic aorta and its branches demonstrate diffuse atherosclerotic calcifications. No aneurysmal dilatation is identified. Coronary calcifications are seen. Pulmonary artery is not significantly enlarged. Mediastinum/Nodes: Thoracic inlet is within normal limits with the exception of a 2  cm hypodense nodule within the right lobe of the thyroid bridging into the isthmus. No sizable hilar or mediastinal adenopathy is noted. No esophageal abnormality is seen. Lungs/Pleura: Lungs are well aerated bilaterally. No focal infiltrate or sizable effusion is seen. Minimal right basilar atelectasis is noted. No sizable effusion or pneumothorax is seen. Upper Abdomen: Renal cystic change is noted and stable from the prior exam. Remainder of the upper abdomen appears within normal limits. Musculoskeletal: Compression deformity is noted at T2 which is new from the prior exam of 2020 but of uncertain chronicity. No other compression deformities are seen. Sternum is unremarkable. Comminuted proximal left humeral fracture is again seen stable from prior plain films. No acute rib fracture is noted. IMPRESSION: Stable left humeral fracture. No definitive rib abnormality is seen. Chronic appearing T2 compression fracture. This is new from prior exam of 2020 but of uncertain chronicity. Correlation to point tenderness is recommended. 2 cm hypodense nodule within the right lobe of the thyroid. This is stable from a prior exam of 2020 and given the patient's age no follow-up recommended unless clinically warranted (ref: J Am Coll Radiol. 2015 Feb;12(2): 143-50). Aortic Atherosclerosis (ICD10-I70.0). Electronically Signed   By: Alcide Clever M.D.   On: 09/03/2020 19:58   CT Cervical Spine Wo Contrast  Result Date: 09/03/2020 CLINICAL DATA:  Fall EXAM: CT HEAD WITHOUT CONTRAST CT CERVICAL SPINE WITHOUT CONTRAST TECHNIQUE: Multidetector CT imaging of the head and cervical spine was performed following the standard protocol without intravenous contrast. Multiplanar CT image reconstructions of the cervical spine were also generated. COMPARISON:  None. FINDINGS: CT HEAD FINDINGS Brain: There is no mass, hemorrhage or extra-axial collection. The size and configuration of the ventricles and extra-axial CSF spaces are normal.  The brain parenchyma is normal, without evidence of acute or chronic infarction. Vascular: Atherosclerotic calcification of the internal carotid arteries at the skull base. No abnormal hyperdensity of the major intracranial arteries or dural venous sinuses. Skull: The visualized skull base,  calvarium and extracranial soft tissues are normal. Sinuses/Orbits: No fluid levels or advanced mucosal thickening of the visualized paranasal sinuses. No mastoid or middle ear effusion. The orbits are normal. CT CERVICAL SPINE FINDINGS Alignment: Grade 1 anterolisthesis at C4-5 and C5-6 Skull base and vertebrae: There is hypertrophy and mineralization at the C1-2 level. There is a large, rounded area of lucency through the odontoid process. There is a type 2 fracture through this region with minimal displacement. Additionally, there is a minimally displaced fracture of the midline anterior arch of C1. there is a compression fracture of T2 with approximately 25% height loss but no retropulsion. Soft tissues and spinal canal: No prevertebral fluid or swelling. No visible canal hematoma. Disc levels: No bony spinal canal stenosis Upper chest: No pneumothorax, pulmonary nodule or pleural effusion. Other: Normal visualized paraspinal cervical soft tissues. IMPRESSION: 1. Minimally displaced anterior C1 arch and type 2 odontoid process fractures. 2. No acute intracranial abnormality. 3. Moderate hypertrophic arthritic change at the atlantoaxial joint, which could be due to rheumatoid arthritis, gout or CPPD. 4. T2 compression fracture with 25% height loss but no retropulsion. Critical Value/emergent results were called by telephone at the time of interpretation on 09/03/2020 at 8:09 pm to provider MATTHEW TRIFAN , who verbally acknowledged these results. Electronically Signed   By: Deatra Robinson M.D.   On: 09/03/2020 20:10   CT HUMERUS LEFT WO CONTRAST  Result Date: 09/03/2020 CLINICAL DATA:  Evaluate right proximal humeral  fracture EXAM: CT OF THE UPPER LEFT EXTREMITY WITHOUT CONTRAST TECHNIQUE: Multidetector CT imaging of the upper left extremity was performed according to the standard protocol. COMPARISON:  X-ray 09/03/2020 FINDINGS: Bones/Joint/Cartilage Acute comminuted fracture of the left humeral head and neck. Transverse component of the surgical neck with approximately 1.2 cm of anterior displacement. Greater tuberosity fracture component with approximately 0.6 cm of posterosuperior displacement. Minimally displaced fracture involves the inferior margin of the lesser tuberosity. No fracture involvement of the humeral head articular surface. Inferior subluxation of the humeral head likely secondary to space-occupying joint effusion/hemarthrosis. No dislocation. No additional fractures. Mild glenohumeral and acromioclavicular osteoarthritis. Elbow joint intact without fracture or dislocation. Ligaments Suboptimally assessed by CT. Muscles and Tendons Preserved bulk of the rotator cuff musculature. Limited evaluation of the rotator cuff tendons. Soft tissues Prominent soft tissue swelling at the fracture site. No organized fluid collection/hematoma. Visualized portion of the left lung field is clear. IMPRESSION: 1. Acute comminuted fracture of the left humeral head and neck, as described above. 2. Inferior subluxation of the humeral head likely secondary to space-occupying joint effusion/hemarthrosis. Electronically Signed   By: Duanne Guess D.O.   On: 09/03/2020 20:04   DG Shoulder Left  Result Date: 09/03/2020 CLINICAL DATA:  Status post fall while raking leaves. EXAM: LEFT SHOULDER - 2+ VIEW COMPARISON:  None FINDINGS: There is an acute, comminuted impacted fracture deformity involving surgical neck and greater tuberosity of the proximal left humerus. No signs of dislocation. Overlying soft tissue swelling noted. IMPRESSION: Acute, comminuted impacted fracture deformity involving the surgical neck and greater  tuberosity of the proximal left humerus. Electronically Signed   By: Signa Kell M.D.   On: 09/03/2020 18:34   DG Humerus Left  Result Date: 09/03/2020 CLINICAL DATA:  Fall raking leaves today.  Left shoulder pain. EXAM: LEFT HUMERUS - 2+ VIEW COMPARISON:  None. FINDINGS: Comminuted and displaced proximal humerus fracture. Dominant fracture plane is through the surgical neck. There is displacement of the greater trochanter of 4 mm. No definite  glenohumeral extension. Distal humerus is intact. IMPRESSION: Comminuted and displaced proximal humerus fracture involving the surgical neck and greater trochanter. Electronically Signed   By: Narda Rutherford M.D.   On: 09/03/2020 18:32    Review of Systems  HENT: Negative for ear discharge, ear pain, hearing loss and tinnitus.   Eyes: Negative for photophobia and pain.  Respiratory: Negative for cough and shortness of breath.   Cardiovascular: Negative for chest pain.  Gastrointestinal: Negative for abdominal pain, nausea and vomiting.  Genitourinary: Negative for dysuria, flank pain, frequency and urgency.  Musculoskeletal: Positive for arthralgias (Left shoulder) and neck pain. Negative for back pain and myalgias.  Neurological: Positive for dizziness. Negative for headaches.  Hematological: Does not bruise/bleed easily.  Psychiatric/Behavioral: The patient is not nervous/anxious.    Blood pressure 140/85, pulse 96, temperature 98.3 F (36.8 C), temperature source Oral, resp. rate 16, height 5\' 3"  (1.6 m), weight 56.2 kg, SpO2 97 %. Physical Exam Constitutional:      General: She is not in acute distress.    Appearance: She is well-developed. She is not diaphoretic.  HENT:     Head: Normocephalic and atraumatic.  Eyes:     General: No scleral icterus.       Right eye: No discharge.        Left eye: No discharge.     Conjunctiva/sclera: Conjunctivae normal.  Neck:     Comments: C-collar Cardiovascular:     Rate and Rhythm: Normal rate  and regular rhythm.  Pulmonary:     Effort: Pulmonary effort is normal. No respiratory distress.  Musculoskeletal:     Comments: Left shoulder, elbow, wrist, digits- no skin wounds, mod TTP shoulder, in sling, no instability, no blocks to motion  Sens  Ax/R/M/U intact  Mot   Ax/ R/ PIN/ M/ AIN/ U intact  Rad 2+  Skin:    General: Skin is warm and dry.  Neurological:     Mental Status: She is alert.  Psychiatric:        Behavior: Behavior normal.     Assessment/Plan: Left humerus fx -- Plan reverse shoulder arthroplasty tomorrow with Dr. if cleared. Please keep NPO after MN. Vertebral fxs -- per NS Multiple medical problems including AVNRT status post ablation, hypertension, hyperlipidemia, and bronchiectasis -- per primary service    Aundria Rud, PA-C Orthopedic Surgery [phone] 09/04/2020, 9:48 AM

## 2020-09-04 NOTE — H&P (Addendum)
History and Physical    Alexandra Reyes ZOX:[id number] DOB: [birth year] DOA: 09/03/2020  PCP: [name], [name], MD  Patient coming from: Home.  Chief Complaint: Fall.  HPI: Alexandra SpikeFlorence C Reyes is a 87 y.o. female with history of AVNRT status post ablation, hypertension, hyperlipidemia, bronchiectasis had a fall at home while she tripped on some a cone on the ER.  Hit her head but did not lose consciousness.  Was brought to the ER.  ED Course: CT scan shows C1/C2/T12 fractures and also x-rays revealed left humeral head fracture with subluxation.  On-call neurosurgery was consulted requested cervical collar placement and on-call orthopedic surgeon Dr. Neurologist was consulted requested follow-up with outpatient.  Given the significant pain at this time patient is admitted for further observation.  Covid test was negative labs are hyponatremia with sodium 126 potassium 5.6 Covid test negative.  WBC count was 17.3.  Review of Systems: As per HPI, rest all negative.   Past Medical History:  Diagnosis Date  . Age-related macular degeneration, wet, both eyes (HCC)   . Alcohol dependence in remission (HCC) 05/22/2011  . Allergic rhinitis, cause unspecified 05/22/2011  . Allergy   . Anemia, iron deficiency 05/18/2011  . Cholelithiasis   . COPD (chronic obstructive pulmonary disease) (HCC)   . DDD (degenerative disc disease), lumbar 05/18/2011  . Depression    "@ times" (09/19/2016)  . First degree AV block   . GERD (gastroesophageal reflux disease)   . Hiatal hernia   . History of blood transfusion    "3 or 4 related to childbirth"  . HTN (hypertension) 05/18/2011   pt denies this hx on 09/19/2016  . Hyperlipidemia 05/18/2011  . Macular degeneration   . Myocardial infarction (HCC)    "EKG shows I've had 2; date unknown" (09/19/2016)  . OA (osteoarthritis)    "a little here and there; mainly in the back" (09/19/2016)  . Osteoporosis 05/18/2011  . PAT (paroxysmal atrial tachycardia) (HCC)   .  Pectus excavatum 05/18/2011  . Personal history of colonic polyps 10/28/2012  . Pneumonia    "as a child"  . SVT (supraventricular tachycardia) (HCC)   . Urinary frequency     Past Surgical History:  Procedure Laterality Date  . ABDOMINAL HYSTERECTOMY  1988  . BREAST BIOPSY Right 1948   "it was ok"  . CARDIAC CATHETERIZATION    . CARDIOVASCULAR STRESS TEST  03/08/2009   EF 84%  . CATARACT EXTRACTION W/ INTRAOCULAR LENS  IMPLANT, BILATERAL Bilateral   . INGUINAL HERNIA REPAIR Right 06/23/2017   Procedure: REPAIR INCARCERATED  RIGHT FEMORAL HERNIA WITH MESH;  Surgeon: Harriette Bouillonornett, Thomas, MD;  Location: [facility] OR;  Service: General;  Laterality: Right;  . KNEE ARTHROSCOPY Right   . RIGHT/LEFT HEART CATH AND CORONARY ANGIOGRAPHY N/A 07/25/2016   Procedure: Right/Left Heart Cath and Coronary Angiography;  Surgeon: Kathleene Hazelhristopher D McAlhany, MD;  Location: [facility];  Service: Cardiovascular;  Laterality: N/A;  . SVT ABLATION  09/19/2016  . SVT ABLATION N/A 09/19/2016   Procedure: SVT Ablation;  Surgeon: Hillis RangeJames Allred, MD;  [facility];  Service: Cardiovascular;  Laterality: N/A;  . TONSILLECTOMY AND ADENOIDECTOMY    . US ECHOCARDIOGRAPHY  01/17/2003   EF 55-60%     reports that she quit smoking about 26 years ago. Her smoking use included cigarettes. She started smoking about 61 years ago. She has a 54.00 pack-year smoking history. She has never used smokeless tobacco. She reports that she does not  drink alcohol and does not use drugs.  No Known Allergies  Family History  Problem Relation Age of Onset  . Heart failure Mother   . Arthritis Mother   . Kidney failure Father   . Heart disease Father   . Hypertension Father     Prior to Admission medications   Medication Sig Start Date End Date Taking? Authorizing Provider  acetaminophen (TYLENOL) 500 MG tablet Take 1,000 mg by mouth 2 (two) times daily as needed for moderate pain or headache.    [provider]   ALPRAZolam Prudy Feeler) 0.25 MG tablet TAKE ONE TABLET BY MOUTH EVERY NIGHT AT BEDTIME AS NEEDED FOR ANXIETY 12/14/19   Corwin Levins, MD  amLODipine (NORVASC) 5 MG tablet TAKE ONE TABLET BY MOUTH DAILY 06/28/20   Allred, Fayrene Fearing, MD  Ascorbic Acid (VITAMIN C PO) Take 1 tablet by mouth daily. Reported on 08/08/2015    [provider]  aspirin 81 MG tablet Take 81 mg by mouth at bedtime.    [provider]  B Complex Vitamins (VITAMIN B COMPLEX PO) Take 1 tablet by mouth daily.    [provider]  budesonide-formoterol (SYMBICORT) 80-4.5 MCG/ACT inhaler INHALE TWO PUFFS BY MOUTH TWICE A DAY *WAIT 20 MINUTES BETWEEN EACH PUFF* 03/09/20   Icard, Elige Radon L, DO  calcium carbonate (OS-CAL) 600 MG tablet Take 600 mg by mouth daily.    [provider]  CARTIA XT 120 MG 24 hr capsule TAKE ONE CAPSULE BY MOUTH DAILY 12/14/19   Allred, Fayrene Fearing, MD  diltiazem (CARDIZEM) 30 MG tablet Take 1 tablet (30 mg total) by mouth 4 (four) times daily as needed. 01/08/17   Allred, Fayrene Fearing, MD  furosemide (LASIX) 40 MG tablet TAKE ONE TABLET BY MOUTH DAILY 06/06/20   Jake Bathe, MD  gabapentin (NEURONTIN) 100 MG capsule TAKE ONE CAPSULE BY MOUTH THREE TIMES A DAY 11/18/19   Kirsteins, Victorino Sparrow, MD  Glucosamine HCl-MSM (GLUCOSAMINE-MSM PO) Take 2 tablets by mouth daily.     [provider]  nitroGLYCERIN (NITROSTAT) 0.4 MG SL tablet Place 1 tablet (0.4 mg total) under the tongue every 5 (five) minutes as needed for chest pain. 05/03/19   Jake Bathe, MD  omeprazole (PRILOSEC) 40 MG capsule TAKE ONE CAPSULE BY MOUTH DAILY 04/04/20   Hilarie Fredrickson, MD  Respiratory Therapy Supplies (FLUTTER) DEVI Use as directed 12/02/18   Bevelyn Ngo, NP  simvastatin (ZOCOR) 10 MG tablet Take 1 tablet (10 mg total) by mouth daily. 07/10/20   Jake Bathe, MD  traMADol (ULTRAM) 50 MG tablet TAKE ONE TABLET BY MOUTH TWICE A DAY AS NEEDED 08/02/20   Corwin Levins, MD  triamcinolone cream (KENALOG) 0.1 % Apply 1  application topically daily as needed (hives). Apply to affected area 12/16/18   Corwin Levins, MD  vitamin E 200 UNIT capsule Take 200 Units by mouth daily.    [provider]    Physical Exam: Constitutional: Moderately built and nourished. Vitals:   09/03/20 2215 09/03/20 2228 09/03/20 2330 09/03/20 2337  BP: 114/85  (!) 145/77   Pulse: (!) 108  (!) 103   Resp:      Temp:  98.4 F (36.9 C)  98.1 F (36.7 C)  TempSrc:  Oral  Oral  SpO2: 99%  99%   Weight:      Height:       Eyes: Anicteric no pallor. ENMT: No discharge from the ears eyes nose or mouth. Neck:  On neck collar. Respiratory: No rhonchi or crepitations. Cardiovascular: S1-S2 heard. Abdomen: Soft nontender bowel sounds present. Musculoskeletal: Left shoulder in sling. Skin: No rash. Neurologic: Alert awake oriented to time place and person.  Moves all extremities. Psychiatric: Appears normal.  Normal affect.   Labs on Admission: I have personally reviewed following labs and imaging studies  CBC: Recent Labs  Lab 09/03/20 1856  WBC 17.3*  HGB 15.0  HCT 43.6  MCV 86.3  PLT 359   Basic Metabolic Panel: Recent Labs  Lab 09/03/20 1856  NA 126*  K 5.6*  CL 95*  CO2 17*  GLUCOSE 110*  BUN 17  CREATININE 0.94  CALCIUM 8.8*   GFR: Estimated Creatinine Clearance: 31.6 mL/min (by C-G formula based on SCr of 0.94 mg/dL). Liver Function Tests: No results for input(s): AST, ALT, ALKPHOS, BILITOT, PROT, ALBUMIN in the last 168 hours. No results for input(s): LIPASE, AMYLASE in the last 168 hours. No results for input(s): AMMONIA in the last 168 hours. Coagulation Profile: No results for input(s): INR, PROTIME in the last 168 hours. Cardiac Enzymes: No results for input(s): CKTOTAL, CKMB, CKMBINDEX, TROPONINI in the last 168 hours. BNP (last 3 results) No results for input(s): PROBNP in the last 8760 hours. HbA1C: No results for input(s): HGBA1C in the last 72 hours. CBG: No results for  input(s): GLUCAP in the last 168 hours. Lipid Profile: No results for input(s): CHOL, HDL, LDLCALC, TRIG, CHOLHDL, LDLDIRECT in the last 72 hours. Thyroid Function Tests: No results for input(s): TSH, T4TOTAL, FREET4, T3FREE, THYROIDAB in the last 72 hours. Anemia Panel: No results for input(s): VITAMINB12, FOLATE, FERRITIN, TIBC, IRON, RETICCTPCT in the last 72 hours. Urine analysis:    Component Value Date/Time   COLORURINE YELLOW 07/05/2020 1356   APPEARANCEUR CLEAR 07/05/2020 1356   LABSPEC 1.010 07/05/2020 1356   PHURINE 7.0 07/05/2020 1356   GLUCOSEU NEGATIVE 07/05/2020 1356   HGBUR NEGATIVE 07/05/2020 1356   BILIRUBINUR NEGATIVE 07/05/2020 1356   KETONESUR NEGATIVE 07/05/2020 1356   PROTEINUR NEGATIVE 06/23/2017 1759   UROBILINOGEN 0.2 07/05/2020 1356   NITRITE NEGATIVE 07/05/2020 1356   LEUKOCYTESUR LARGE (A) 07/05/2020 1356   Sepsis Labs: (procalcitonin:4,lacticidven:4) ) Recent Results (from the past 240 hour(s))  Resp Panel by RT-PCR (Flu A&B, Covid) Nasopharyngeal Swab     Status: None   Collection Time: 09/03/20  8:35 PM   Specimen: Nasopharyngeal Swab; Nasopharyngeal(NP) swabs in vial transport medium  Result Value Ref Range Status   SARS Coronavirus 2 by RT PCR NEGATIVE NEGATIVE Final    Comment: (NOTE) SARS-CoV-2 target nucleic acids are NOT DETECTED.  The SARS-CoV-2 RNA is generally detectable in upper respiratory specimens during the acute phase of infection. The lowest concentration of SARS-CoV-2 viral copies this assay can detect is 138 copies/mL. A negative result does not preclude SARS-Cov-2 infection and should not be used as the sole basis for treatment or other patient management decisions. A negative result may occur with  improper specimen collection/handling, submission of specimen other than nasopharyngeal swab, presence of viral mutation(s) within the areas targeted by this assay, and inadequate number of viral copies(<138  copies/mL). A negative result must be combined with clinical observations, patient history, and epidemiological information. The expected result is Negative.  Fact Sheet for Patients:  BloggerCourse.com  Fact Sheet for Healthcare Providers:  SeriousBroker.it  This test is no t yet approved or cleared by the [place] FDA and  has been authorized for detection and/or diagnosis of SARS-CoV-2 by FDA under  an Emergency Use Authorization (EUA). This EUA will remain  in effect (meaning this test can be used) for the duration of the COVID-19 declaration under Section 564(b)(1) of the Act, 21 U.S.C.section 360bbb-3(b)(1), unless the authorization is terminated  or revoked sooner.       Influenza A by PCR NEGATIVE NEGATIVE Final   Influenza B by PCR NEGATIVE NEGATIVE Final    Comment: (NOTE) The Xpert Xpress SARS-CoV-2/FLU/RSV plus assay is intended as an aid in the diagnosis of influenza from Nasopharyngeal swab specimens and should not be used as a sole basis for treatment. Nasal washings and aspirates are unacceptable for Xpert Xpress SARS-CoV-2/FLU/RSV testing.  Fact Sheet for Patients: BloggerCourse.com  Fact Sheet for Healthcare Providers: SeriousBroker.it  This test is not yet approved or cleared by the [place] FDA and has been authorized for detection and/or diagnosis of SARS-CoV-2 by FDA under an Emergency Use Authorization (EUA). This EUA will remain in effect (meaning this test can be used) for the duration of the COVID-19 declaration under Section 564(b)(1) of the Act, 21 U.S.C. section 360bbb-3(b)(1), unless the authorization is terminated or revoked.  Performed at [facility], [address], [place], [place] [address]      Radiological Exams on Admission: CT Head Wo Contrast  Result Date: 09/03/2020 CLINICAL DATA:  Fall EXAM: CT HEAD WITHOUT  CONTRAST CT CERVICAL SPINE WITHOUT CONTRAST TECHNIQUE: Multidetector CT imaging of the head and cervical spine was performed following the standard protocol without intravenous contrast. Multiplanar CT image reconstructions of the cervical spine were also generated. COMPARISON:  None. FINDINGS: CT HEAD FINDINGS Brain: There is no mass, hemorrhage or extra-axial collection. The size and configuration of the ventricles and extra-axial CSF spaces are normal. The brain parenchyma is normal, without evidence of acute or chronic infarction. Vascular: Atherosclerotic calcification of the internal carotid arteries at the skull base. No abnormal hyperdensity of the major intracranial arteries or dural venous sinuses. Skull: The visualized skull base, calvarium and extracranial soft tissues are normal. Sinuses/Orbits: No fluid levels or advanced mucosal thickening of the visualized paranasal sinuses. No mastoid or middle ear effusion. The orbits are normal. CT CERVICAL SPINE FINDINGS Alignment: Grade 1 anterolisthesis at C4-5 and C5-6 Skull base and vertebrae: There is hypertrophy and mineralization at the C1-2 level. There is a large, rounded area of lucency through the odontoid process. There is a type 2 fracture through this region with minimal displacement. Additionally, there is a minimally displaced fracture of the midline anterior arch of C1. there is a compression fracture of T2 with approximately 25% height loss but no retropulsion. Soft tissues and spinal canal: No prevertebral fluid or swelling. No visible canal hematoma. Disc levels: No bony spinal canal stenosis Upper chest: No pneumothorax, pulmonary nodule or pleural effusion. Other: Normal visualized paraspinal cervical soft tissues. IMPRESSION: 1. Minimally displaced anterior C1 arch and type 2 odontoid process fractures. 2. No acute intracranial abnormality. 3. Moderate hypertrophic arthritic change at the atlantoaxial joint, which could be due to rheumatoid  arthritis, gout or CPPD. 4. T2 compression fracture with 25% height loss but no retropulsion. Critical Value/emergent results were called by telephone at the time of interpretation on 09/03/2020 at 8:09 pm to provider MATTHEW TRIFAN , who verbally acknowledged these results. Electronically Signed   By: Deatra Robinson M.D.   On: 09/03/2020 20:10   CT Chest Wo Contrast  Result Date: 09/03/2020 CLINICAL DATA:  Recent fall with known proximal left humeral fracture and chest pain, evaluate for  possible rib fracture EXAM: CT CHEST WITHOUT CONTRAST TECHNIQUE: Multidetector CT imaging of the chest was performed following the standard protocol without IV contrast. COMPARISON:  12/28/2018 FINDINGS: Cardiovascular: Thoracic aorta and its branches demonstrate diffuse atherosclerotic calcifications. No aneurysmal dilatation is identified. Coronary calcifications are seen. Pulmonary artery is not significantly enlarged. Mediastinum/Nodes: Thoracic inlet is within normal limits with the exception of a 2 cm hypodense nodule within the right lobe of the thyroid bridging into the isthmus. No sizable hilar or mediastinal adenopathy is noted. No esophageal abnormality is seen. Lungs/Pleura: Lungs are well aerated bilaterally. No focal infiltrate or sizable effusion is seen. Minimal right basilar atelectasis is noted. No sizable effusion or pneumothorax is seen. Upper Abdomen: Renal cystic change is noted and stable from the prior exam. Remainder of the upper abdomen appears within normal limits. Musculoskeletal: Compression deformity is noted at T2 which is new from the prior exam of 2020 but of uncertain chronicity. No other compression deformities are seen. Sternum is unremarkable. Comminuted proximal left humeral fracture is again seen stable from prior plain films. No acute rib fracture is noted. IMPRESSION: Stable left humeral fracture. No definitive rib abnormality is seen. Chronic appearing T2 compression fracture. This is new  from prior exam of 2020 but of uncertain chronicity. Correlation to point tenderness is recommended. 2 cm hypodense nodule within the right lobe of the thyroid. This is stable from a prior exam of 2020 and given the patient's age no follow-up recommended unless clinically warranted (ref: J Am Coll Radiol. 2015 Feb;12(2): 143-50). Aortic Atherosclerosis (ICD10-I70.0). Electronically Signed   By: Alcide Clever M.D.   On: 09/03/2020 19:58   CT Cervical Spine Wo Contrast  Result Date: 09/03/2020 CLINICAL DATA:  Fall EXAM: CT HEAD WITHOUT CONTRAST CT CERVICAL SPINE WITHOUT CONTRAST TECHNIQUE: Multidetector CT imaging of the head and cervical spine was performed following the standard protocol without intravenous contrast. Multiplanar CT image reconstructions of the cervical spine were also generated. COMPARISON:  None. FINDINGS: CT HEAD FINDINGS Brain: There is no mass, hemorrhage or extra-axial collection. The size and configuration of the ventricles and extra-axial CSF spaces are normal. The brain parenchyma is normal, without evidence of acute or chronic infarction. Vascular: Atherosclerotic calcification of the internal carotid arteries at the skull base. No abnormal hyperdensity of the major intracranial arteries or dural venous sinuses. Skull: The visualized skull base, calvarium and extracranial soft tissues are normal. Sinuses/Orbits: No fluid levels or advanced mucosal thickening of the visualized paranasal sinuses. No mastoid or middle ear effusion. The orbits are normal. CT CERVICAL SPINE FINDINGS Alignment: Grade 1 anterolisthesis at C4-5 and C5-6 Skull base and vertebrae: There is hypertrophy and mineralization at the C1-2 level. There is a large, rounded area of lucency through the odontoid process. There is a type 2 fracture through this region with minimal displacement. Additionally, there is a minimally displaced fracture of the midline anterior arch of C1. there is a compression fracture of T2 with  approximately 25% height loss but no retropulsion. Soft tissues and spinal canal: No prevertebral fluid or swelling. No visible canal hematoma. Disc levels: No bony spinal canal stenosis Upper chest: No pneumothorax, pulmonary nodule or pleural effusion. Other: Normal visualized paraspinal cervical soft tissues. IMPRESSION: 1. Minimally displaced anterior C1 arch and type 2 odontoid process fractures. 2. No acute intracranial abnormality. 3. Moderate hypertrophic arthritic change at the atlantoaxial joint, which could be due to rheumatoid arthritis, gout or CPPD. 4. T2 compression fracture with 25% height loss but no retropulsion.  Critical Value/emergent results were called by telephone at the time of interpretation on 09/03/2020 at 8:09 pm to provider MATTHEW TRIFAN , who verbally acknowledged these results. Electronically Signed   By: Deatra Robinson M.D.   On: 09/03/2020 20:10   CT HUMERUS LEFT WO CONTRAST  Result Date: 09/03/2020 CLINICAL DATA:  Evaluate right proximal humeral fracture EXAM: CT OF THE UPPER LEFT EXTREMITY WITHOUT CONTRAST TECHNIQUE: Multidetector CT imaging of the upper left extremity was performed according to the standard protocol. COMPARISON:  X-ray 09/03/2020 FINDINGS: Bones/Joint/Cartilage Acute comminuted fracture of the left humeral head and neck. Transverse component of the surgical neck with approximately 1.2 cm of anterior displacement. Greater tuberosity fracture component with approximately 0.6 cm of posterosuperior displacement. Minimally displaced fracture involves the inferior margin of the lesser tuberosity. No fracture involvement of the humeral head articular surface. Inferior subluxation of the humeral head likely secondary to space-occupying joint effusion/hemarthrosis. No dislocation. No additional fractures. Mild glenohumeral and acromioclavicular osteoarthritis. Elbow joint intact without fracture or dislocation. Ligaments Suboptimally assessed by CT. Muscles and Tendons  Preserved bulk of the rotator cuff musculature. Limited evaluation of the rotator cuff tendons. Soft tissues Prominent soft tissue swelling at the fracture site. No organized fluid collection/hematoma. Visualized portion of the left lung field is clear. IMPRESSION: 1. Acute comminuted fracture of the left humeral head and neck, as described above. 2. Inferior subluxation of the humeral head likely secondary to space-occupying joint effusion/hemarthrosis. Electronically Signed   By: Duanne Guess D.O.   On: 09/03/2020 20:04   DG Shoulder Left  Result Date: 09/03/2020 CLINICAL DATA:  Status post fall while raking leaves. EXAM: LEFT SHOULDER - 2+ VIEW COMPARISON:  None FINDINGS: There is an acute, comminuted impacted fracture deformity involving surgical neck and greater tuberosity of the proximal left humerus. No signs of dislocation. Overlying soft tissue swelling noted. IMPRESSION: Acute, comminuted impacted fracture deformity involving the surgical neck and greater tuberosity of the proximal left humerus. Electronically Signed   By: Signa Kell M.D.   On: 09/03/2020 18:34   DG Humerus Left  Result Date: 09/03/2020 CLINICAL DATA:  Fall raking leaves today.  Left shoulder pain. EXAM: LEFT HUMERUS - 2+ VIEW COMPARISON:  None. FINDINGS: Comminuted and displaced proximal humerus fracture. Dominant fracture plane is through the surgical neck. There is displacement of the greater trochanter of 4 mm. No definite glenohumeral extension. Distal humerus is intact. IMPRESSION: Comminuted and displaced proximal humerus fracture involving the surgical neck and greater trochanter. Electronically Signed   By: Narda Rutherford M.D.   On: 09/03/2020 18:32      Assessment/Plan Principal Problem:   Cervical spine fracture (HCC) Active Problems:   Paroxysmal SVT (supraventricular tachycardia) (HCC)   ILD (interstitial lung disease) (HCC)    1. C1 and C2 and T12 fracture status post fall for which neurosurgery  has been consulted and requested placing patient on cervical collar and following up with them as outpatient.  Presently on pain medication will get physical therapy consult. 2. Left humeral head comminuted fracture with subluxation for which Dr. Aundria Rud orthopedic surgeon has been consulted presently on sling.  Requested follow-up as an outpatient.  Pain medication at this time. 3. History of AVNRT status post ablation on Cardizem. 4. Hypertension on Cardizem and amlodipine. 5. History of CHF appears compensated on Lasix. 6. Hyperlipidemia on statins. 7. History of bronchiectasis on inhalers. 8. Hyponatremia with hyperkalemia appears to be new.  Follow metabolic panel closely.  Check urine studies. 9. Leukocytosis likely reactive.  DVT prophylaxis: SCDs for now.  If no surgery anticipated may change Lovenox. Code Status: Full code. Family Communication: Son at the bedside. Disposition Plan: Home. Consults called: Neurosurgery, orthopedics, physical therapy. Admission status: Observation.   Eduard Clos MD [facility] Pager [phone].  If 7PM-7AM, please contact night-coverage [email or web address] Password [facility]  09/04/2020, 12:33 AM

## 2020-09-04 NOTE — Evaluation (Signed)
Physical Therapy Evaluation Patient Details Name: MATA ROWEN MRN: [id number] DOB: 25-May-1926 Today's Date: 09/04/2020   History of Present Illness  Pt is a 87yo female who sustained a fall while raking leaves in her yard and sustained C1/C2/T2 fractures and L humeral head fracture and subluxation, planned for reverse shld surgery by Dr. Aundria Rud on 3/29. PMH: COPD, DD, first degree AV block, GERD, hiatal hernia pSH: inguinal hernia repair 2019    Clinical Impression  Pt admitted with above. Pt planned to have L reverse total shoulder surgery on Wednesday 3/29. Pt functioning currently at min/modA. Pt very eager to mobilize and recover. Pt very appreciative of PT services. Pt was indep and living alone PTA. Spoke extensively with patient regarding d/c options. Pt reports he daughter lives in [place] but would probably come out but doesn't know how long she could stay now that she is having surgery. Pt also with son here locally but unsure how long he could stay as well. Pt aware she is unsafe to return home alone and if her family can't assist will need to go to [facility] however strongly prefers to go home. Pt needing assist with all ADLs and mobility at this time. PT to cont to follow and will re-assess s/p surgery tomorrow.     Follow Up Recommendations Home health PT;Supervision/Assistance - 24 hour (if daughter and son can not provide 24/7 assist then pt with need [facility] upon d/c. Pt to talk with family when they visit today)    Equipment Recommendations   (TBD s/p surgery)    Recommendations for Other Services       Precautions / Restrictions Precautions Precautions: Fall Precaution Comments: L UE in sling, c-collar Required Braces or Orthoses: Cervical Brace;Sling Cervical Brace: Hard collar;At all times Restrictions Weight Bearing Restrictions: Yes LUE Weight Bearing: Non weight bearing Other Position/Activity Restrictions: L UE in sling, planned for OR tomorrow 3/29       Mobility  Bed Mobility Overal bed mobility: Needs Assistance Bed Mobility: Rolling;Sidelying to Sit Rolling: Min assist Sidelying to sit: Min assist       General bed mobility comments: pt used R UE on bed rail to help pull herself over onto R shoulder, minA for trunk elevation to get elbow underneath to push up on, pt with report of dizzinses    Transfers Overall transfer level: Needs assistance Equipment used: 1 person hand held assist Transfers: Sit to/from Stand Sit to Stand: Min assist         General transfer comment: minA to steady during powering up due to inability to use L UE  Ambulation/Gait Ambulation/Gait assistance: Min assist;Mod assist Gait Distance (Feet): 15 Feet (x2) Assistive device: 1 person hand held assist Gait Pattern/deviations: Step-through pattern;Decreased stride length;Trunk flexed Gait velocity: impulsively fast due to urgency to use bathroom   General Gait Details: pt with report of prolapsed rectum and had urgency to use the bathroom upon standing, pt quick to move and unsteady requiring modA to prevent fall more so due to urgency and moving to fast, pt aware she is unsteady and thankful for the assist from the PT  Stairs            Wheelchair Mobility    Modified Rankin (Stroke Patients Only)       Balance Overall balance assessment: Needs assistance Sitting-balance support: Feet supported;Single extremity supported Sitting balance-Leahy Scale: Fair     Standing balance support: Single extremity supported;During functional activity Standing balance-Leahy Scale: Poor Standing balance comment:  requires external support                             Pertinent Vitals/Pain Pain Assessment: 0-10 Pain Score: 8  Pain Location: L shoulder Pain Descriptors / Indicators: Discomfort Pain Intervention(s): Patient requesting pain meds-RN notified    Home Living Family/patient expects to be discharged to:: Private  residence Living Arrangements: Alone Available Help at Discharge: Family;Available PRN/intermittently (reports son and daughter may be able to stay with her but not for a long time) Type of Home: House Home Access: Stairs to enter Entrance Stairs-Rails: Can reach both Entrance Stairs-Number of Steps: 5 Home Layout: One level Home Equipment: Cane - single point;Bedside commode;Grab bars - toilet;Grab bars - tub/shower      Prior Function Level of Independence: Independent         Comments: very active, still doing yard work     Higher education careers adviser   Dominant Hand: Right    Extremity/Trunk Assessment   Upper Extremity Assessment Upper Extremity Assessment: LUE deficits/detail LUE Deficits / Details: able to move wrist, hand and elbow, no movement at shld due to pain and fracture    Lower Extremity Assessment Lower Extremity Assessment: Generalized weakness    Cervical / Trunk Assessment Cervical / Trunk Assessment: Other exceptions Cervical / Trunk Exceptions: multiple fractures  Communication   Communication: No difficulties  Cognition Arousal/Alertness: Awake/alert Behavior During Therapy: WFL for tasks assessed/performed Overall Cognitive Status: Within Functional Limits for tasks assessed                                        General Comments General comments (skin integrity, edema, etc.): vss, swelling at L shld. Pt taken to the bathroom. Pt attempted to complete hygiene but needed assist with back side, pt requiring assist to open item on her breakfast tray due to only having R UE to use    Exercises     Assessment/Plan    PT Assessment Patient needs continued PT services  PT Problem List Decreased strength;Decreased activity tolerance;Decreased balance;Decreased mobility;Decreased knowledge of use of DME       PT Treatment Interventions DME instruction;Gait training;Stair training;Functional mobility training;Therapeutic  activities;Therapeutic exercise;Balance training    PT Goals (Current goals can be found in the Care Plan section)  Acute Rehab PT Goals Patient Stated Goal: get stronger to be able to go home and not to rehab PT Goal Formulation: With patient Time For Goal Achievement: 09/18/20 Potential to Achieve Goals: Good    Frequency Min 4X/week   Barriers to discharge Decreased caregiver support lives alone    Co-evaluation               AM-PAC PT "6 Clicks" Mobility  Outcome Measure Help needed turning from your back to your side while in a flat bed without using bedrails?: A Little Help needed moving from lying on your back to sitting on the side of a flat bed without using bedrails?: A Little Help needed moving to and from a bed to a chair (including a wheelchair)?: A Little Help needed standing up from a chair using your arms (e.g., wheelchair or bedside chair)?: A Little Help needed to walk in hospital room?: A Lot Help needed climbing 3-5 steps with a railing? : A Lot 6 Click Score: 16    End of Session Equipment Utilized During Treatment:  Gait belt;Cervical collar;Other (comment) (L UE sling) Activity Tolerance: Patient tolerated treatment well Patient left: in chair;with call bell/phone within reach;with chair alarm set;with nursing/sitter in room Nurse Communication: Mobility status;Patient requests pain meds PT Visit Diagnosis: Unsteadiness on feet (R26.81);Difficulty in walking, not elsewhere classified (R26.2);Pain Pain - Right/Left: Left Pain - part of body: Shoulder    Time: 0859-1000 PT Time Calculation (min) (ACUTE ONLY): 61 min   Charges:   PT Evaluation $PT Eval Moderate Complexity: 1 Mod PT Treatments $Gait Training: 8-22 mins $Therapeutic Activity: 23-37 mins        Lewis Shock, PT, DPT Acute Rehabilitation Services Pager #: [phone] Office #: [phone]   Iona Hansen 09/04/2020, 10:24 AM

## 2020-09-04 NOTE — Plan of Care (Signed)

## 2020-09-04 NOTE — Progress Notes (Signed)
Patient arrived to room via stretcher.  Positioned for comfort in bed, purwick placed.  Patient oriented to room, call light. Vitals stable at this time.  Orders reviewed with patient, all questions answered. Morphine given for complaint of pain. Call light within reach.

## 2020-09-04 NOTE — Progress Notes (Signed)
Alexandra Reyes  PPJ:[id number] DOB: Apr 23, 1926 DOA: 09/03/2020 PCP: Corwin Levins, MD    Brief Narrative:  [phone] w/ a hx of AVNRT status post ablation, HTN, HLD, COPD, macular degeneration, and bronchiectasis who suffered a mechanical fall at home and presented to the ER.  There was no loss of consciousness.  CT noted C1/C2/T2 fractures as well as a left humeral head fracture with subluxation.  Significant Events:  3/28 admit via ER after mechanical fall with multiple fractures  Antimicrobials:  None  DVT prophylaxis: SCDs  Consultants:  None  Code Status: FULL CODE  Subjective: Patient was examined and interviewed by one of my partners earlier today.  Assessment & Plan:  C1 anterior arch fracture, type II odontoid fracture, and T2 compression fracture -traumatic Evaluated by Neurosurgery -placed in cervical collar which she is to wear at all times - to follow-up with Neurosurgery as outpatient in 2 weeks  Left humeral head comminuted fracture with subluxation Orthopedic trauma service following -for operative correction tomorrow -no outstanding medical issues which would prohibit surgical correction at this time  AVNRT status post ablation Continue chronic Cardizem  HTN  HLD  COPD with bronchiectasis Continue usual inhaled medications  Hyponatremia Review of records suggest this is a chronic issue, but perhaps a little more pronounced at this time -likely slightly volume depleted -gently hydrate and follow  Hyperkalemia Appears to been due to simple hemolysis -normal in follow-up   Family Communication:   Objective: Blood pressure 140/85, pulse 96, temperature 98.3 F (36.8 C), temperature source Oral, resp. rate 16, height 5\' 3"  (1.6 m), weight 56.2 kg, SpO2 97 %. No intake or output data in the 24 hours ending 09/04/20 1005 Filed Weights   09/03/20 1949  Weight: 56.2 kg    Examination: Patient was examined by my partner earlier today  CBC: Recent  Labs  Lab 09/03/20 1856 09/04/20 0436  WBC 17.3* 12.1*  HGB 15.0 13.6  HCT 43.6 39.3  MCV 86.3 86.9  PLT 359 317   Basic Metabolic Panel: Recent Labs  Lab 09/03/20 1856 09/04/20 0436  NA 126* 127*  K 5.6* 3.7  CL 95* 99  CO2 17* 18*  GLUCOSE 110* 118*  BUN 17 13  CREATININE 0.94 0.79  CALCIUM 8.8* 8.5*   GFR: Estimated Creatinine Clearance: 37.1 mL/min (by C-G formula based on SCr of 0.79 mg/dL).  Liver Function Tests: No results for input(s): AST, ALT, ALKPHOS, BILITOT, PROT, ALBUMIN in the last 168 hours. No results for input(s): LIPASE, AMYLASE in the last 168 hours. No results for input(s): AMMONIA in the last 168 hours.  Coagulation Profile: No results for input(s): INR, PROTIME in the last 168 hours.  Cardiac Enzymes: No results for input(s): CKTOTAL, CKMB, CKMBINDEX, TROPONINI in the last 168 hours.  HbA1C: Hgb A1c MFr Bld  Date/Time Value Ref Range Status  07/05/2020 01:56 PM 5.8 4.6 - 6.5 % Final    Comment:    Glycemic Control Guidelines for People with Diabetes:Non Diabetic:  <6%Goal of Therapy: <7%Additional Action Suggested:  >8%   06/23/2019 02:26 PM 5.8 4.6 - 6.5 % Final    Comment:    Glycemic Control Guidelines for People with Diabetes:Non Diabetic:  <6%Goal of Therapy: <7%Additional Action Suggested:  >8%     CBG: No results for input(s): GLUCAP in the last 168 hours.  Recent Results (from the past 240 hour(s))  Resp Panel by RT-PCR (Flu A&B, Covid) Nasopharyngeal Swab     Status: None  Collection Time: 09/03/20  8:35 PM   Specimen: Nasopharyngeal Swab; Nasopharyngeal(NP) swabs in vial transport medium  Result Value Ref Range Status   SARS Coronavirus 2 by RT PCR NEGATIVE NEGATIVE Final    Comment: (NOTE) SARS-CoV-2 target nucleic acids are NOT DETECTED.  The SARS-CoV-2 RNA is generally detectable in upper respiratory specimens during the acute phase of infection. The lowest concentration of SARS-CoV-2 viral copies this assay can  detect is 138 copies/mL. A negative result does not preclude SARS-Cov-2 infection and should not be used as the sole basis for treatment or other patient management decisions. A negative result may occur with  improper specimen collection/handling, submission of specimen other than nasopharyngeal swab, presence of viral mutation(s) within the areas targeted by this assay, and inadequate number of viral copies(<138 copies/mL). A negative result must be combined with clinical observations, patient history, and epidemiological information. The expected result is Negative.  Fact Sheet for Patients:  BloggerCourse.com  Fact Sheet for Healthcare Providers:  SeriousBroker.it  This test is no t yet approved or cleared by the [place] FDA and  has been authorized for detection and/or diagnosis of SARS-CoV-2 by FDA under an Emergency Use Authorization (EUA). This EUA will remain  in effect (meaning this test can be used) for the duration of the COVID-19 declaration under Section 564(b)(1) of the Act, 21 U.S.C.section 360bbb-3(b)(1), unless the authorization is terminated  or revoked sooner.       Influenza A by PCR NEGATIVE NEGATIVE Final   Influenza B by PCR NEGATIVE NEGATIVE Final    Comment: (NOTE) The Xpert Xpress SARS-CoV-2/FLU/RSV plus assay is intended as an aid in the diagnosis of influenza from Nasopharyngeal swab specimens and should not be used as a sole basis for treatment. Nasal washings and aspirates are unacceptable for Xpert Xpress SARS-CoV-2/FLU/RSV testing.  Fact Sheet for Patients: BloggerCourse.com  Fact Sheet for Healthcare Providers: SeriousBroker.it  This test is not yet approved or cleared by the [place] FDA and has been authorized for detection and/or diagnosis of SARS-CoV-2 by FDA under an Emergency Use Authorization (EUA). This EUA will remain in  effect (meaning this test can be used) for the duration of the COVID-19 declaration under Section 564(b)(1) of the Act, 21 U.S.C. section 360bbb-3(b)(1), unless the authorization is terminated or revoked.  Performed at [facility], [address] [place], [place] [address]      Scheduled Meds: . amLODipine  5 mg Oral Daily  . diltiazem  120 mg Oral Daily  . feeding supplement  296 mL Oral Once  . furosemide  40 mg Oral Daily  . gabapentin  100 mg Oral TID  . mometasone-formoterol  2 puff Inhalation BID  . pantoprazole  40 mg Oral Daily  . simvastatin  10 mg Oral Daily   :   LOS: 0 days   Lonia Blood, MD [facility] Office  [phone] Pager - Text Page per Amion  If 7PM-7AM, please contact night-coverage per Amion 09/04/2020, 10:05 AM

## 2020-09-04 NOTE — ED Notes (Signed)
Made pt and family aware of admit status at this time

## 2020-09-04 NOTE — ED Notes (Signed)
Alexandra Reyes son [id number]

## 2020-09-04 NOTE — ED Notes (Signed)
Per pt and son he will be taking her hearing aids home. Pt wants it noted that her hearing aids were found on the floor and that someone dropped them. Advised pt I could only chart they were located on the floor when this rn first entered the room and that I did not witness anyone dropping them on the floor.

## 2020-09-04 NOTE — Plan of Care (Signed)
  Problem: Pain Managment: Goal: General experience of comfort will improve Outcome: Progressing   Problem: Safety: Goal: Ability to remain free from injury will improve Outcome: Progressing   

## 2020-09-05 ENCOUNTER — Encounter (HOSPITAL_COMMUNITY): Payer: Self-pay | Admitting: Internal Medicine

## 2020-09-05 ENCOUNTER — Encounter (HOSPITAL_COMMUNITY)
Admission: EM | Disposition: A | Discharge status: Home Health | Payer: Self-pay | Source: Home / Self Care | Attending: Internal Medicine

## 2020-09-05 ENCOUNTER — Inpatient Hospital Stay (HOSPITAL_COMMUNITY): Payer: Medicare Other | Admitting: Certified Registered"

## 2020-09-05 ENCOUNTER — Inpatient Hospital Stay (HOSPITAL_COMMUNITY): Payer: Medicare Other

## 2020-09-05 DIAGNOSIS — S129XXD Fracture of neck, unspecified, subsequent encounter: Secondary | ICD-10-CM

## 2020-09-05 DIAGNOSIS — S22000A Wedge compression fracture of unspecified thoracic vertebra, initial encounter for closed fracture: Secondary | ICD-10-CM

## 2020-09-05 HISTORY — PX: REVERSE SHOULDER ARTHROPLASTY: SHX5054

## 2020-09-05 LAB — CBC
HCT: 36.3 % (ref 36.0–46.0)
Hemoglobin: 12.4 g/dL (ref 12.0–15.0)
MCH: 30 pg (ref 26.0–34.0)
MCHC: 34.2 g/dL (ref 30.0–36.0)
MCV: 87.7 fL (ref 80.0–100.0)
Platelets: 291 10*3/uL (ref 150–400)
RBC: 4.14 MIL/uL (ref 3.87–5.11)
RDW: 14.6 % (ref 11.5–15.5)
WBC: 14.6 10*3/uL — ABNORMAL HIGH (ref 4.0–10.5)
nRBC: 0 % (ref 0.0–0.2)

## 2020-09-05 LAB — COMPREHENSIVE METABOLIC PANEL
ALT: 16 U/L (ref 0–44)
AST: 23 U/L (ref 15–41)
Albumin: 3 g/dL — ABNORMAL LOW (ref 3.5–5.0)
Alkaline Phosphatase: 63 U/L (ref 38–126)
Anion gap: 9 (ref 5–15)
BUN: 8 mg/dL (ref 8–23)
CO2: 22 mmol/L (ref 22–32)
Calcium: 8.2 mg/dL — ABNORMAL LOW (ref 8.9–10.3)
Chloride: 98 mmol/L (ref 98–111)
Creatinine, Ser: 0.55 mg/dL (ref 0.44–1.00)
GFR, Estimated: >60 mL/min (ref >60)
Glucose, Bld: 139 mg/dL — ABNORMAL HIGH (ref 70–99)
Potassium: 3.2 mmol/L — ABNORMAL LOW (ref 3.5–5.1)
Sodium: 129 mmol/L — ABNORMAL LOW (ref 135–145)
Total Bilirubin: 1 mg/dL (ref 0.3–1.2)
Total Protein: 5.7 g/dL — ABNORMAL LOW (ref 6.5–8.1)

## 2020-09-05 SURGERY — ARTHROPLASTY, SHOULDER, TOTAL, REVERSE
Anesthesia: General | Site: Shoulder | Laterality: Left

## 2020-09-05 MED ORDER — OXYCODONE HCL 5 MG PO TABS: 5.0000 mg | ORAL_TABLET | Freq: Three times a day (TID) | ORAL | 0 refills | Status: DC | PRN

## 2020-09-05 MED ORDER — FENTANYL CITRATE (PF) 100 MCG/2ML IJ SOLN
INTRAMUSCULAR | Status: AC
  Filled 2020-09-05: qty 2

## 2020-09-05 MED ORDER — DEXAMETHASONE SODIUM PHOSPHATE 10 MG/ML IJ SOLN
INTRAMUSCULAR | Status: DC | PRN
  Administered 2020-09-05: 10 mg via INTRAVENOUS

## 2020-09-05 MED ORDER — SUGAMMADEX SODIUM 200 MG/2ML IV SOLN
INTRAVENOUS | Status: DC | PRN
  Administered 2020-09-05: 150 mg via INTRAVENOUS

## 2020-09-05 MED ORDER — TRANEXAMIC ACID-NACL 1000-0.7 MG/100ML-% IV SOLN
INTRAVENOUS | Status: DC | PRN
  Administered 2020-09-05: 1000 mg via INTRAVENOUS

## 2020-09-05 MED ORDER — PHENYLEPHRINE 40 MCG/ML (10ML) SYRINGE FOR IV PUSH (FOR BLOOD PRESSURE SUPPORT)
PREFILLED_SYRINGE | INTRAVENOUS | Status: DC | PRN
  Administered 2020-09-05 (×4): 80 ug via INTRAVENOUS

## 2020-09-05 MED ORDER — BUPIVACAINE HCL (PF) 0.25 % IJ SOLN
INTRAMUSCULAR | Status: AC
  Filled 2020-09-05: qty 30

## 2020-09-05 MED ORDER — ONDANSETRON HCL 4 MG/2ML IJ SOLN: 4.0000 mg | Freq: Four times a day (QID) | INTRAMUSCULAR | Status: DC | PRN

## 2020-09-05 MED ORDER — BUPIVACAINE-EPINEPHRINE (PF) 0.25% -1:200000 IJ SOLN
INTRAMUSCULAR | Status: DC | PRN
  Administered 2020-09-05: 18 mL

## 2020-09-05 MED ORDER — DOCUSATE SODIUM 100 MG PO CAPS
100.0000 mg | ORAL_CAPSULE | Freq: Two times a day (BID) | ORAL | Status: DC
  Administered 2020-09-05 – 2020-09-06 (×2): 100 mg via ORAL
  Filled 2020-09-05 (×4): qty 1

## 2020-09-05 MED ORDER — ONDANSETRON HCL 4 MG PO TABS: 4.0000 mg | ORAL_TABLET | Freq: Four times a day (QID) | ORAL | Status: DC | PRN

## 2020-09-05 MED ORDER — PHENOL 1.4 % MT LIQD: 1.0000 | OROMUCOSAL | Status: DC | PRN

## 2020-09-05 MED ORDER — EPINEPHRINE PF 1 MG/ML IJ SOLN
INTRAMUSCULAR | Status: AC
  Filled 2020-09-05: qty 1

## 2020-09-05 MED ORDER — METOCLOPRAMIDE HCL 5 MG/ML IJ SOLN: 5.0000 mg | Freq: Three times a day (TID) | INTRAMUSCULAR | Status: DC | PRN

## 2020-09-05 MED ORDER — FENTANYL CITRATE (PF) 250 MCG/5ML IJ SOLN
INTRAMUSCULAR | Status: AC
  Filled 2020-09-05: qty 5

## 2020-09-05 MED ORDER — POTASSIUM CHLORIDE CRYS ER 20 MEQ PO TBCR: 40.0000 meq | EXTENDED_RELEASE_TABLET | Freq: Once | ORAL | Status: DC

## 2020-09-05 MED ORDER — ROCURONIUM BROMIDE 10 MG/ML (PF) SYRINGE
PREFILLED_SYRINGE | INTRAVENOUS | Status: DC | PRN
  Administered 2020-09-05: 40 mg via INTRAVENOUS
  Administered 2020-09-05: 30 mg via INTRAVENOUS

## 2020-09-05 MED ORDER — PROPOFOL 10 MG/ML IV BOLUS
INTRAVENOUS | Status: DC | PRN
  Administered 2020-09-05: 80 mg via INTRAVENOUS

## 2020-09-05 MED ORDER — TRANEXAMIC ACID-NACL 1000-0.7 MG/100ML-% IV SOLN
INTRAVENOUS | Status: AC
  Filled 2020-09-05: qty 100

## 2020-09-05 MED ORDER — PROPOFOL 10 MG/ML IV BOLUS
INTRAVENOUS | Status: AC
  Filled 2020-09-05: qty 20

## 2020-09-05 MED ORDER — ONDANSETRON HCL 4 MG/2ML IJ SOLN
INTRAMUSCULAR | Status: DC | PRN
  Administered 2020-09-05: 4 mg via INTRAVENOUS

## 2020-09-05 MED ORDER — VANCOMYCIN HCL 1000 MG IV SOLR
INTRAVENOUS | Status: AC
  Filled 2020-09-05: qty 1000

## 2020-09-05 MED ORDER — VANCOMYCIN HCL 1 G IV SOLR
INTRAVENOUS | Status: DC | PRN
  Administered 2020-09-05: 1000 mg

## 2020-09-05 MED ORDER — 0.9 % SODIUM CHLORIDE (POUR BTL) OPTIME
TOPICAL | Status: DC | PRN
  Administered 2020-09-05: 1000 mL

## 2020-09-05 MED ORDER — LACTATED RINGERS IV SOLN: INTRAVENOUS | Status: DC

## 2020-09-05 MED ORDER — ONDANSETRON HCL 4 MG/2ML IJ SOLN: 4.0000 mg | Freq: Once | INTRAMUSCULAR | Status: DC | PRN

## 2020-09-05 MED ORDER — FENTANYL CITRATE (PF) 100 MCG/2ML IJ SOLN
25.0000 ug | INTRAMUSCULAR | Status: DC | PRN
  Administered 2020-09-05: 50 ug via INTRAVENOUS
  Administered 2020-09-05: 25 ug via INTRAVENOUS

## 2020-09-05 MED ORDER — METOCLOPRAMIDE HCL 5 MG PO TABS: 5.0000 mg | ORAL_TABLET | Freq: Three times a day (TID) | ORAL | Status: DC | PRN

## 2020-09-05 MED ORDER — CHLORHEXIDINE GLUCONATE 0.12 % MT SOLN: 15.0000 mL | Freq: Once | OROMUCOSAL | Status: AC

## 2020-09-05 MED ORDER — LIDOCAINE 2% (20 MG/ML) 5 ML SYRINGE
INTRAMUSCULAR | Status: DC | PRN
  Administered 2020-09-05: 60 mg via INTRAVENOUS

## 2020-09-05 MED ORDER — POTASSIUM CHLORIDE 10 MEQ/100ML IV SOLN
10.0000 meq | INTRAVENOUS | Status: AC
  Administered 2020-09-05 (×3): 10 meq via INTRAVENOUS
  Filled 2020-09-05 (×3): qty 100

## 2020-09-05 MED ORDER — ORAL CARE MOUTH RINSE: 15.0000 mL | Freq: Once | OROMUCOSAL | Status: AC

## 2020-09-05 MED ORDER — CEFAZOLIN SODIUM-DEXTROSE 1-4 GM/50ML-% IV SOLN
1.0000 g | Freq: Three times a day (TID) | INTRAVENOUS | Status: AC
  Administered 2020-09-05 – 2020-09-06 (×3): 1 g via INTRAVENOUS
  Filled 2020-09-05 (×4): qty 50

## 2020-09-05 MED ORDER — MENTHOL 3 MG MT LOZG: 1.0000 | LOZENGE | OROMUCOSAL | Status: DC | PRN

## 2020-09-05 MED ORDER — FENTANYL CITRATE (PF) 250 MCG/5ML IJ SOLN
INTRAMUSCULAR | Status: DC | PRN
  Administered 2020-09-05 (×3): 50 ug via INTRAVENOUS

## 2020-09-05 MED ORDER — CHLORHEXIDINE GLUCONATE 0.12 % MT SOLN
OROMUCOSAL | Status: AC
  Administered 2020-09-05: 15 mL via OROMUCOSAL
  Filled 2020-09-05: qty 15

## 2020-09-05 SURGICAL SUPPLY — 73 items
BASEPLATE AUG FULL 24X10X2 LAT (Plate) ×2 IMPLANT
BASEPLATE MOD POST AUGM MGS 20 (Plate) ×2 IMPLANT
BIT DRILL 5/64X5 DISP (BIT) ×3 IMPLANT
BIT DRILL FLUTED 3.0 STRL (BIT) ×2 IMPLANT
BLADE SAG 18X100X1.27 (BLADE) ×1 IMPLANT
CLOSURE WOUND 1/2 X4 (GAUZE/BANDAGES/DRESSINGS) ×2
COVER SURGICAL LIGHT HANDLE (MISCELLANEOUS) ×3 IMPLANT
COVER WAND RF STERILE (DRAPES) ×1 IMPLANT
CUP SUT UNIV REVERS 36 NEUTRAL (Cup) ×2 IMPLANT
DRAPE IMP U-DRAPE 54X76 (DRAPES) ×6 IMPLANT
DRAPE INCISE IOBAN 66X45 STRL (DRAPES) ×3 IMPLANT
DRAPE ORTHO SPLIT 77X108 STRL (DRAPES) ×6
DRAPE SURG 17X23 STRL (DRAPES) ×3 IMPLANT
DRAPE SURG ORHT 6 SPLT 77X108 (DRAPES) ×2 IMPLANT
DRAPE U-SHAPE 47X51 STRL (DRAPES) ×3 IMPLANT
DRESSING AQUACEL AG SP 3.5X6 (GAUZE/BANDAGES/DRESSINGS) ×1 IMPLANT
DRSG AQUACEL AG ADV 3.5X10 (GAUZE/BANDAGES/DRESSINGS) ×3 IMPLANT
DRSG AQUACEL AG SP 3.5X6 (GAUZE/BANDAGES/DRESSINGS)
DURAPREP 26ML APPLICATOR (WOUND CARE) ×3 IMPLANT
ELECT BLADE 4.0 EZ CLEAN MEGAD (MISCELLANEOUS) ×3
ELECT REM PT RETURN 9FT ADLT (ELECTROSURGICAL) ×3
ELECTRODE BLDE 4.0 EZ CLN MEGD (MISCELLANEOUS) ×1 IMPLANT
ELECTRODE REM PT RTRN 9FT ADLT (ELECTROSURGICAL) ×1 IMPLANT
FACESHIELD WRAPAROUND (MASK) IMPLANT
FACESHIELD WRAPAROUND OR TEAM (MASK) ×1 IMPLANT
GLENOSPHERE 36 +4 LAT/24 (Joint) ×2 IMPLANT
GLOVE BIO SURGEON STRL SZ7.5 (GLOVE) ×6 IMPLANT
GLOVE SRG 8 PF TXTR STRL LF DI (GLOVE) ×2 IMPLANT
GLOVE SURG UNDER POLY LF SZ8 (GLOVE) ×6
GOWN STRL REUS W/ TWL LRG LVL3 (GOWN DISPOSABLE) ×1 IMPLANT
GOWN STRL REUS W/ TWL XL LVL3 (GOWN DISPOSABLE) ×2 IMPLANT
GOWN STRL REUS W/TWL LRG LVL3 (GOWN DISPOSABLE) ×3
GOWN STRL REUS W/TWL XL LVL3 (GOWN DISPOSABLE) ×6
KIT BASIN OR (CUSTOM PROCEDURE TRAY) ×3 IMPLANT
KIT TURNOVER KIT B (KITS) ×3 IMPLANT
LINER HUMERAL 36 +3MM SM (Shoulder) ×2 IMPLANT
MANIFOLD NEPTUNE II (INSTRUMENTS) ×3 IMPLANT
NDL 1/2 CIR MAYO (NEEDLE) ×1 IMPLANT
NDL HYPO 25GX1X1/2 BEV (NEEDLE) ×1 IMPLANT
NEEDLE 1/2 CIR MAYO (NEEDLE) ×3 IMPLANT
NEEDLE HYPO 25GX1X1/2 BEV (NEEDLE) ×3 IMPLANT
NS IRRIG 1000ML POUR BTL (IV SOLUTION) ×3 IMPLANT
PACK SHOULDER (CUSTOM PROCEDURE TRAY) ×3 IMPLANT
PAD ARMBOARD 7.5X6 YLW CONV (MISCELLANEOUS) ×6 IMPLANT
PIN SET MODULAR GLENOID SYSTEM (PIN) ×2 IMPLANT
REAMER ANGLED HEAD SMALL (DRILL) ×2 IMPLANT
RESTRAINT HEAD UNIVERSAL NS (MISCELLANEOUS) ×3 IMPLANT
SCREW PERI LOCK 5.5X16 (Screw) ×6 IMPLANT
SCREW PERIPHERAL 5.5X28 LOCK (Screw) ×2 IMPLANT
SLING ARM IMMOBILIZER LRG (SOFTGOODS) IMPLANT
SLING ARM IMMOBILIZER MED (SOFTGOODS) IMPLANT
SPONGE LAP 18X18 RF (DISPOSABLE) IMPLANT
SPONGE LAP 4X18 RFD (DISPOSABLE) ×3 IMPLANT
STEM HUMERAL UNIVER REV SIZE 7 (Stem) ×2 IMPLANT
STRIP CLOSURE SKIN 1/2X4 (GAUZE/BANDAGES/DRESSINGS) ×3 IMPLANT
SUCTION FRAZIER HANDLE 10FR (MISCELLANEOUS) ×3
SUCTION TUBE FRAZIER 10FR DISP (MISCELLANEOUS) ×1 IMPLANT
SUT FIBERWIRE #2 38 T-5 BLUE (SUTURE) ×6
SUT MNCRL AB 3-0 PS2 27 (SUTURE) ×2 IMPLANT
SUT MNCRL AB 4-0 PS2 18 (SUTURE) ×3 IMPLANT
SUT VIC AB 1 CT1 27 (SUTURE) ×3
SUT VIC AB 1 CT1 27XBRD ANBCTR (SUTURE) IMPLANT
SUT VIC AB 2-0 CT1 27 (SUTURE) ×3
SUT VIC AB 2-0 CT1 TAPERPNT 27 (SUTURE) ×1 IMPLANT
SUTURE FIBERWR #2 38 T-5 BLUE (SUTURE) ×1 IMPLANT
SUTURE TAPE 1.3 40 TPR END (SUTURE) IMPLANT
SUTURETAPE 1.3 40 TPR END (SUTURE) ×6
SYR CONTROL 10ML LL (SYRINGE) ×3 IMPLANT
TOWEL GREEN STERILE (TOWEL DISPOSABLE) ×3 IMPLANT
TOWEL GREEN STERILE FF (TOWEL DISPOSABLE) ×3 IMPLANT
TOWER CARTRIDGE SMART MIX (DISPOSABLE) IMPLANT
WATER STERILE IRR 1000ML POUR (IV SOLUTION) ×3 IMPLANT
YANKAUER SUCT BULB TIP NO VENT (SUCTIONS) ×3 IMPLANT

## 2020-09-05 NOTE — Brief Op Note (Signed)
09/03/2020 - 09/05/2020  2:45 PM  PATIENT:  Alexandra Reyes  87 y.o. female  PRE-OPERATIVE DIAGNOSIS:  Left humerus fracture  POST-OPERATIVE DIAGNOSIS:  Left humerus fracture  PROCEDURE:  Procedure(s): REVERSE SHOULDER ARTHROPLASTY (Left)  SURGEON:  Surgeon(s) and Role:    * Yolonda Kida, MD - Primary  PHYSICIAN ASSISTANT: Dion Saucier, PA-C  ANESTHESIA:   regional and general  EBL:  100 mL   BLOOD ADMINISTERED:none  DRAINS: none   LOCAL MEDICATIONS USED:  0.25% marcaine with epi  SPECIMEN:  No Specimen  DISPOSITION OF SPECIMEN:  N/A  COUNTS:  YES  TOURNIQUET:  * No tourniquets in log *  DICTATION: .Note written in EPIC  PLAN OF CARE: Admit to inpatient   PATIENT DISPOSITION:  PACU - hemodynamically stable.   Delay start of Pharmacological VTE agent (>24hrs) due to surgical blood loss or risk of bleeding: not applicable

## 2020-09-05 NOTE — Op Note (Signed)
Date: 09/05/2020   PRE-OPERATIVE DIAGNOSIS:  Left proximal humerus fracture   POST-OPERATIVE DIAGNOSIS:  Same   PROCEDURE:  1. Left REVERSE SHOULDER ARTHROPLASTY    SURGEON:  Yolonda Kida, MD   ASSISTANT: Dion Saucier, PA-C   ANESTHESIA:   General with a block   ESTIMATED BLOOD LOSS: See anesthesia record   PREOPERATIVE INDICATIONS: Alexandra Reyes is a 87 yo, RHD female who sustained a Left proximal humerus fracture following a fall.  Due to the comminution and displaced nature of the fracture and head splitting components we discussed operative management.  We discussed moving forward with reverse shoulder arthroplasty for his injury to allow earlier weight bearing on a walker and/or cane as needed and lower risk of AVN, non union or progression of shoulder arthritis following the fracture. Thus we elected to proceed with reverse shoulder arthroplasty for the plan.  The risks benefits and alternatives were discussed with the patient preoperatively including but not limited to the risks of infection, bleeding, nerve injury, cardiopulmonary complications, the need for revision surgery, dislocation, brachial plexus palsy, incomplete relief of pain, among others, and the patient was willing to proceed. The patient patient provided informed consent.   OPERATIVE IMPLANTS:  Arthrex size 7 long humeral stem Size full superior augment with 20 mm central post 4 peripheral locking screws Standard humeral tray with +3 mm poly liner   OPERATIVE FINDINGS:   Comminuted, four-part proximal humerus fracture with head splitting component in the coronal plane.  Biceps tendon was intact.  Rotator cuff musculature was intact to the tuberosity fragments.  There was moderate glenoid wear noted in the superior half of the glenoid face.   OPERATIVE PROCEDURE: The patient was brought to the operating room and placed in the supine position. General anesthesia was administered, and care was taken to  maintain C-spine precautions throughout the procedure while maintaining the Aspen collar as well. IV antibiotics were given. Time out was performed. The upper extremity was prepped and draped in usual sterile fashion. The patient was in a beachchair position. Deltopectoral approach was carried out. After dissection through skin and subcutaneous fat, the cephalic vein was identified with the deltopectoral interval.     The fracture was identified and working through the fracture in the biceps groove the lesser and greater tuberosities were freed up and tagged with #2 Fiber wire sutures.  The humeral head was fragmented and removed from the wound.     Next, the long head of the biceps tendon was tenotomized.   I then performed circumferential releases of the humerus.  I then moved to sizing the humerus.  There was minimal calcar bone loss and this was used to reference the height of the stem, as was the upper border of the pectoralis major tendon.  The canal was reamed and found to fit best with a 7 mm stem.   We next turned to the glenoid.  Deep retractors were placed, and I resected the labrum as well as the residual long head of biceps, and then placed a guidepin into the center position on the glenoid, with slight inferior declination.  At this juncture we used the augmented templates.  It was noted to be suitable for a full superior augment.  I then reamed over the guidepin, and this created a small metaphyseal cancellus blush inferiorly, removing just the cartilage to the subchondral bone superiorly. The base plate was selected and impacted place, and then I secured it with superior and inferior long locking screws.  I placed a short locking screws on anterior aspect,  And posterior aspect.   I then turned my attention to the glenosphere, and impacted this into place, and securing it with the central set screw.    The glenoid sphere was completely seated, and had engagement of the [facility] taper. I then  turned my attention back to the humerus.    The 7 mm stem was seated to the appropriate height to allow approximate 5.6 cm from the top of the Pectoralis major tendon to the top of the glenosphere.  The stem was placed in 30 degrees of retroversion.     Once the stem was impacted into place we trialed poly liners.  Using the standard humeral metaglen, with a +3 mm polyethylene, The shoulder had excellent motion, and was stable.  The final poly was impacted and again showed good motion and stability.  Next, I irrigated the wounds copiously.    The greater tuberosity was brought back to the humeral stem suture holes and secured with bone graft from the humeral head as augment.  The lesser tuberosity was likewise repaired back to the suture cup holes of the proximal humerus.  The deltoid was noted to have excellent tension.  The axillary nerve was palpated at the end of implanting, and found to be in continuity and not under undo tension.   I then irrigated the shoulder copiously once more, repaired the deltopectoral interval with Vicryl followed by subcutaneous monocryl and then subcuticular monocryl with Steri-Strips and sterile gauze for the skin. The patient was awakened and returned back in stable and satisfactory condition. There no complications and he tolerated the procedure well.  All counts were correct x 2.   The patient awakened from general anesthesia with no complications and transferred to PACU in stable condition.   Postoperative Plan: Alexandra Reyes will remain in his sling until her regional block has worn off, but is ok to remove it for use with his walker, as tolerated.  The sling will be worn for sleep for 6 weeks.  She will return back to the medicine service for postoperative care and medical management.  We will begin occupational therapy while in the hospital on the conservative protocol.  I will see her back in the office in 2 weeks.

## 2020-09-05 NOTE — Progress Notes (Signed)
Patient transferred to short stay with stable condition.

## 2020-09-05 NOTE — Anesthesia Preprocedure Evaluation (Signed)
Anesthesia Evaluation  Patient identified by MRN, date of birth, ID band Patient awake  General Assessment Comment:87yo w/ a hx of AVNRT status post ablation, HTN, HLD, COPD, macular degeneration, and bronchiectasis who suffered a mechanical fall at home and presented to the ER  Reviewed: Allergy & Precautions, NPO status , Patient's Chart, lab work & pertinent test results  Airway Mallampati: II  TM Distance: >3 FB Neck ROM: Limited    Dental  (+) Teeth Intact, Dental Advisory Given   Pulmonary COPD, former smoker,    Pulmonary exam normal breath sounds clear to auscultation       Cardiovascular hypertension, Pt. on medications + CAD and + Past MI  Normal cardiovascular exam+ dysrhythmias Supra Ventricular Tachycardia  Rhythm:Regular Rate:Normal     Neuro/Psych PSYCHIATRIC DISORDERS Depression C1 anterior arch fracture, type II odontoid fracture, and T2 compression fracture -traumatic Evaluated by Neurosurgery -placed in cervical collar which she is to wear at all times    GI/Hepatic hiatal hernia, GERD  Medicated,(+)     substance abuse  alcohol use,   Endo/Other  negative endocrine ROS  Renal/GU negative Renal ROS     Musculoskeletal  (+) Arthritis , Left humerus fx   Abdominal   Peds  Hematology negative hematology ROS (+)   Anesthesia Other Findings Day of surgery medications reviewed with the patient.  Reproductive/Obstetrics                             Anesthesia Physical Anesthesia Plan  ASA: III  Anesthesia Plan: General   Post-op Pain Management:    Induction: Intravenous  PONV Risk Score and Plan: 3  Airway Management Planned: Oral ETT and Video Laryngoscope Planned  Additional Equipment:   Intra-op Plan:   Post-operative Plan: Extubation in OR  Informed Consent: I have reviewed the patients History and Physical, chart, labs and discussed the procedure including  the risks, benefits and alternatives for the proposed anesthesia with the patient or authorized representative who has indicated his/her understanding and acceptance.     Dental advisory given  Plan Discussed with: CRNA  Anesthesia Plan Comments:         Anesthesia Quick Evaluation

## 2020-09-05 NOTE — Transfer of Care (Signed)
Immediate Anesthesia Transfer of Care Note  Patient: Alexandra Reyes  Procedure(s) Performed: REVERSE SHOULDER ARTHROPLASTY (Left Shoulder)  Patient Location: PACU  Anesthesia Type:General  Level of Consciousness: drowsy and patient cooperative  Airway & Oxygen Therapy: Patient Spontanous Breathing  Post-op Assessment: Report given to RN, Post -op Vital signs reviewed and stable and Patient moving all extremities X 4  Post vital signs: Reviewed and stable  Last Vitals:  Vitals Value Taken Time  BP 147/70 09/05/20 1516  Temp    Pulse 93 09/05/20 1518  Resp 17 09/05/20 1518  SpO2 100 % 09/05/20 1518  Vitals shown include unvalidated device data.  Last Pain:  Vitals:   09/05/20 0757  TempSrc: Oral  PainSc:       Patients Stated Pain Goal: 0 (09/04/20 2200)  Complications: No complications documented.

## 2020-09-05 NOTE — TOC Initial Note (Addendum)
Transition of Care [facility]) - Initial/Assessment Note    Patient Details  Name: Alexandra Reyes MRN: [id number] Date of Birth: 09-Jul-1925  Transition of [facility]) CM/SW Contact:    Epifanio Lesches, RN Phone Number: 09/05/2020, 9:37 AM  Clinical Narrative:                 Admitted s/p a fall, suffered C1/C2/T12 fractures and left humeral head fracture.  Hx of AVNRT s/p ablation, hypertension, hyperlipidemia, bronchiectasis, ILD. PTA home alone. Independent with ADL's, no DME usage.  Plan: reverse shoulder arthroplasty  with Dr. Aundria Rud 3/29   [facility])     [phone]      Per son, daughter Gerome Apley from [place] to assist with 24/7 supervision and  care x 2 weeks once d/c if d/c to home.  TOC team monitoring  and will assist TOC  needs....   09/06/2020 @ 1547 NCM spoke with pt and son regarding d/c plan for tomorrow. Pt states already has bsc, will need rolling walker and shower chair @ d/c Referral made with [employer] for.DME needs, ... equipment will be delivered to bedside prior to d/c.   Expected Discharge Plan: Home/Self Care Barriers to Discharge: Continued Medical Work up   Patient Goals and CMS Choice        Expected Discharge Plan and Services Expected Discharge Plan: Home/Self Care                                              Prior Living Arrangements/Services                       Activities of Daily Living Home Assistive Devices/Equipment: None ADL Screening (condition at time of admission) Patient's cognitive ability adequate to safely complete daily activities?: Yes Is the patient deaf or have difficulty hearing?: Yes Does the patient have difficulty seeing, even when wearing glasses/contacts?: No Does the patient have difficulty concentrating, remembering, or making decisions?: No Patient able to express need for assistance with ADLs?: Yes Does the patient have difficulty dressing or bathing?: No Independently performs  ADLs?: Yes (appropriate for developmental age) Does the patient have difficulty walking or climbing stairs?: No Weakness of Legs: Both Weakness of Arms/Hands: Left  Permission Sought/Granted                  Emotional Assessment              Admission diagnosis:  Cervical spine fracture (HCC) [S12.9XXA] Closed displaced fracture of surgical neck of left humerus, unspecified fracture morphology, initial encounter [S42.212A] Closed odontoid fracture, initial encounter (HCC) [S12.100A] Compression fracture of thoracic vertebra, unspecified thoracic vertebral level, initial encounter (HCC) [S22.000A] C1 cervical fracture (HCC) [S12.000A] Patient Active Problem List   Diagnosis Date Noted  . C1 cervical fracture (HCC) 09/04/2020  . Closed displaced fracture of surgical neck of left humerus   . Cervical spine fracture (HCC) 09/03/2020  . Rectal prolapse 07/05/2020  . Low back pain 07/05/2020  . Spondylosis without myelopathy or radiculopathy, lumbar region 11/23/2019  . Costochondritis 08/10/2018  . Wrist pain 06/20/2018  . Abnormal urine color 06/17/2018  . Hyperglycemia 12/15/2017  . Femoral hernia of right side with obstruction 06/23/2017  . CKD (chronic kidney disease), stage III (HCC) 03/12/2017  . Weight loss 11/27/2016  . SVT (supraventricular tachycardia) (HCC) 09/19/2016  . Left-sided  nosebleed 09/11/2016  . Coronary artery disease involving native coronary artery of native heart without angina pectoris   . ILD (interstitial lung disease) (HCC) 05/22/2016  . Constipation 03/13/2016  . Bronchiectasis without acute exacerbation (HCC) 03/13/2016  . Intercostal neuralgia 08/30/2015  . Dyspnea 08/02/2015  . Bilateral leg pain 02/01/2015  . Diastolic dysfunction 02/01/2015  . Peripheral edema 02/01/2015  . Abnormal EKG 05/18/2014  . Syncope 05/18/2014  . Dyslipidemia 05/14/2013  . Dyspnea on exertion 05/14/2013  . DOE (dyspnea on exertion) 04/28/2013  . Personal  history of colonic polyps 10/28/2012  . Osteopenia 04/29/2012  . Elevated digoxin level 04/29/2012  . Allergic rhinitis, cause unspecified 05/22/2011  . Alcohol dependence in remission (HCC) 05/22/2011  . Depression 05/22/2011  . Preventative health care 05/18/2011  . HTN (hypertension) 05/18/2011  . Hyperlipidemia 05/18/2011  . Anemia, iron deficiency 05/18/2011  . GERD (gastroesophageal reflux disease) 05/18/2011  . DDD (degenerative disc disease), lumbar 05/18/2011  . Pectus excavatum 05/18/2011  . Macular degeneration   . OA (osteoarthritis)   . Cholelithiasis   . COPD (chronic obstructive pulmonary disease) (HCC)   . Paroxysmal SVT (supraventricular tachycardia) (HCC) 02/07/2011   PCP:  Corwin Levins, MD Pharmacy:   [facility] [address] [place] - [address] [place] [address] Phone: [phone] Fax: [phone]     Social Determinants of Health (SDOH) Interventions    Readmission Risk Interventions No flowsheet data found.

## 2020-09-05 NOTE — Progress Notes (Signed)
PHARMACY NOTE:  ANTIMICROBIAL RENAL DOSAGE ADJUSTMENT  Current antimicrobial regimen includes a mismatch between antimicrobial dosage and estimated renal function.  As per policy approved by the Pharmacy & Therapeutics and Medical Executive Committees, the antimicrobial dosage will be adjusted accordingly.  Current antimicrobial dosage:  Cefazolin 1 g IV every 6 hours x 3 doses  Indication: surgical prophylaxis  Renal Function: Estimated Creatinine Clearance: 37.1 mL/min (by C-G formula based on SCr of 0.55 mg/dL). []      On intermittent HD, scheduled: []      On CRRT    Antimicrobial dosage has been changed to:   Cefazolin 1 g IV every 8 hours x 3 doses.  Thank you for allowing pharmacy to be a part of this patient's care.  , RPh Clinical Pharmacist  Please check AMION for all [facility] Pharmacy phone numbers After 10:00 PM, call Main Pharmacy [phone]  09/05/2020 4:06 PM

## 2020-09-05 NOTE — Progress Notes (Addendum)
PIV consult: Current IV infusing, patent. No additional site needed per RN. Cancel consult.

## 2020-09-05 NOTE — Progress Notes (Signed)
Patient returned from PACU, Alert and oriented x4. No c/o pain at this time. Ambulated to chair.

## 2020-09-05 NOTE — Progress Notes (Signed)
Alexandra Reyes  IWP:[id number] DOB: Feb 21, 1926 DOA: 09/03/2020 PCP: Corwin Levins, MD    Brief Narrative:  [phone] w/ a hx of AVNRT status post ablation, HTN, HLD, COPD, macular degeneration, and bronchiectasis who suffered a mechanical fall at home and presented to the ER.  There was no loss of consciousness.  CT noted C1/C2/T2 fractures as well as a left humeral head fracture with subluxation.  Significant Events:  3/28 admit via ER after mechanical fall with multiple fractures  Antimicrobials:  None  DVT prophylaxis: SCDs  Consultants:  None  Code Status: FULL CODE  Subjective: Afebrile.  Vital signs stable.  Mild hypokalemia.  Sodium improving.  Reports significant pain in her left arm.  Denies chest pain or shortness of breath.  Assessment & Plan:  C1 anterior arch fracture, type II odontoid fracture, and T2 compression fracture -traumatic Evaluated by Neurosurgery -placed in cervical collar which she is to wear at all times - to follow-up with Neurosurgery as outpatient in 2 weeks  Left humeral head comminuted fracture with subluxation Orthopedic trauma service following -for operative correction today - no outstanding medical issues which would prohibit surgical correction at this time  AVNRT status post ablation Continue chronic Cardizem - in NSR   HTN Blood pressure well controlled  HLD Continue home medical therapy  COPD with bronchiectasis Continue usual inhaled medications -well compensated at present  Hyponatremia Review of records suggest this is a chronic issue, but perhaps a little more pronounced at this time -likely slightly volume depleted -improving with gentle hydration  Hypokalemia Gently supplement and follow  Family Communication: Spoke with son at bedside  Objective: Blood pressure 119/66, pulse 89, temperature 98.2 F (36.8 C), temperature source Oral, resp. rate 18, height 5\' 3"  (1.6 m), weight 56.2 kg, SpO2 98 %.  Intake/Output  Summary (Last 24 hours) at 09/05/2020 0956 Last data filed at 09/05/2020 0900 Gross per 24 hour  Intake 997.87 ml  Output 300 ml  Net 697.87 ml   Filed Weights   09/03/20 1949  Weight: 56.2 kg    Examination: General: No acute respiratory distress Lungs: Clear to auscultation bilaterally  Cardiovascular: Regular rate and rhythm without murmur  Abdomen: Nontender, nondistended, soft, bowel sounds positive, no rebound Extremities: No signif edema bilateral lower extremities   CBC: Recent Labs  Lab 09/03/20 1856 09/04/20 0436 09/05/20 0257  WBC 17.3* 12.1* 14.6*  HGB 15.0 13.6 12.4  HCT 43.6 39.3 36.3  MCV 86.3 86.9 87.7  PLT 359 317 291   Basic Metabolic Panel: Recent Labs  Lab 09/03/20 1856 09/04/20 0436 09/05/20 0257  NA 126* 127* 129*  K 5.6* 3.7 3.2*  CL 95* 99 98  CO2 17* 18* 22  GLUCOSE 110* 118* 139*  BUN 17 13 8   CREATININE 0.94 0.79 0.55  CALCIUM 8.8* 8.5* 8.2*   GFR: Estimated Creatinine Clearance: 37.1 mL/min (by C-G formula based on SCr of 0.55 mg/dL).  Liver Function Tests: Recent Labs  Lab 09/05/20 0257  AST 23  ALT 16  ALKPHOS 63  BILITOT 1.0  PROT 5.7*  ALBUMIN 3.0*    HbA1C: Hgb A1c MFr Bld  Date/Time Value Ref Range Status  07/05/2020 01:56 PM 5.8 4.6 - 6.5 % Final    Comment:    Glycemic Control Guidelines for People with Diabetes:Non Diabetic:  <6%Goal of Therapy: <7%Additional Action Suggested:  >8%   06/23/2019 02:26 PM 5.8 4.6 - 6.5 % Final    Comment:    Glycemic Control Guidelines  for People with Diabetes:Non Diabetic:  <6%Goal of Therapy: <7%Additional Action Suggested:  >8%     CBG: No results for input(s): GLUCAP in the last 168 hours.  Recent Results (from the past 240 hour(s))  Resp Panel by RT-PCR (Flu A&B, Covid) Nasopharyngeal Swab     Status: None   Collection Time: 09/03/20  8:35 PM   Specimen: Nasopharyngeal Swab; Nasopharyngeal(NP) swabs in vial transport medium  Result Value Ref Range Status   SARS  Coronavirus 2 by RT PCR NEGATIVE NEGATIVE Final    Comment: (NOTE) SARS-CoV-2 target nucleic acids are NOT DETECTED.  The SARS-CoV-2 RNA is generally detectable in upper respiratory specimens during the acute phase of infection. The lowest concentration of SARS-CoV-2 viral copies this assay can detect is 138 copies/mL. A negative result does not preclude SARS-Cov-2 infection and should not be used as the sole basis for treatment or other patient management decisions. A negative result may occur with  improper specimen collection/handling, submission of specimen other than nasopharyngeal swab, presence of viral mutation(s) within the areas targeted by this assay, and inadequate number of viral copies(<138 copies/mL). A negative result must be combined with clinical observations, patient history, and epidemiological information. The expected result is Negative.  Fact Sheet for Patients:  BloggerCourse.com  Fact Sheet for Healthcare Providers:  SeriousBroker.it  This test is no t yet approved or cleared by the [place] FDA and  has been authorized for detection and/or diagnosis of SARS-CoV-2 by FDA under an Emergency Use Authorization (EUA). This EUA will remain  in effect (meaning this test can be used) for the duration of the COVID-19 declaration under Section 564(b)(1) of the Act, 21 U.S.C.section 360bbb-3(b)(1), unless the authorization is terminated  or revoked sooner.       Influenza A by PCR NEGATIVE NEGATIVE Final   Influenza B by PCR NEGATIVE NEGATIVE Final    Comment: (NOTE) The Xpert Xpress SARS-CoV-2/FLU/RSV plus assay is intended as an aid in the diagnosis of influenza from Nasopharyngeal swab specimens and should not be used as a sole basis for treatment. Nasal washings and aspirates are unacceptable for Xpert Xpress SARS-CoV-2/FLU/RSV testing.  Fact Sheet for  Patients: BloggerCourse.com  Fact Sheet for Healthcare Providers: SeriousBroker.it  This test is not yet approved or cleared by the [place] FDA and has been authorized for detection and/or diagnosis of SARS-CoV-2 by FDA under an Emergency Use Authorization (EUA). This EUA will remain in effect (meaning this test can be used) for the duration of the COVID-19 declaration under Section 564(b)(1) of the Act, 21 U.S.C. section 360bbb-3(b)(1), unless the authorization is terminated or revoked.  Performed at [facility], [address] [place], [place] [address]   Surgical PCR screen     Status: None   Collection Time: 09/04/20 10:23 AM   Specimen: Nasal Mucosa; Nasal Swab  Result Value Ref Range Status   MRSA, PCR NEGATIVE NEGATIVE Final   Staphylococcus aureus NEGATIVE NEGATIVE Final    Comment: (NOTE) The Xpert SA Assay (FDA approved for NASAL specimens in patients 50 years of age and older), is one component of a comprehensive surveillance program. It is not intended to diagnose infection nor to guide or monitor treatment. Performed at [facility], [address] [place], [place] [address]      Scheduled Meds: . amLODipine  5 mg Oral Daily  . diltiazem  120 mg Oral Daily  . gabapentin  100 mg Oral TID  . mometasone-formoterol  2 puff  Inhalation BID  . mupirocin ointment  1 application Nasal BID  . pantoprazole  40 mg Oral Daily  . povidone-iodine  2 application Topical Once  . simvastatin  10 mg Oral Daily   : . sodium chloride 50 mL/hr at 09/05/20 0631  .  ceFAZolin (ANCEF) IV       LOS: 1 day   Lonia Blood, MD [facility] Office  [phone] Pager - Text Page per Amion  If 7PM-7AM, please contact night-coverage per Amion 09/05/2020, 9:56 AM

## 2020-09-05 NOTE — Plan of Care (Signed)
  Problem: Safety: Goal: Ability to remain free from injury will improve Outcome: Progressing   Problem: Pain Managment: Goal: General experience of comfort will improve Outcome: Progressing   

## 2020-09-05 NOTE — H&P (Addendum)
H&P update  The surgical history has been reviewed and remains accurate without interval change.  The patient was re-examined and patient's physiologic condition has not changed significantly in the last 30 days. The condition still exists that makes this procedure necessary. The treatment plan remains the same, without new options for care.  No new pharmacological allergies or types of therapy has been initiated that would change the plan or the appropriateness of the plan.  The patient and/or family understand the potential benefits and risks.  On physical exam she is complaining of some decrease sensation on the ulnar nerve distribution.  This is intermittent per her subjective component.  My exam today she has good motor function of ulnar nerve as well as sensation.  Alexandra Reyes P. Aundria Rud, MD 09/05/2020 12:33 PM

## 2020-09-05 NOTE — Anesthesia Procedure Notes (Addendum)
Procedure Name: Intubation Date/Time: 09/05/2020 1:08 PM Performed by: Macie Burows, CRNA Pre-anesthesia Checklist: Patient identified, Emergency Drugs available, Suction available and Patient being monitored Patient Re-evaluated:Patient Re-evaluated prior to induction Oxygen Delivery Method: Circle system utilized Preoxygenation: Pre-oxygenation with 100% oxygen Induction Type: IV induction Ventilation: Mask ventilation without difficulty Laryngoscope Size: Glidescope and 3 Grade View: Grade I Tube type: Oral Tube size: 7.0 mm Number of attempts: 1 Airway Equipment and Method: Video-laryngoscopy and Rigid stylet Placement Confirmation: ETT inserted through vocal cords under direct vision,  positive ETCO2 and breath sounds checked- equal and bilateral Secured at: 23 cm Tube secured with: Tape Dental Injury: Teeth and Oropharynx as per pre-operative assessment  Comments: Cervical spine stabilized by c-collar throughout intubation

## 2020-09-05 NOTE — Discharge Instructions (Signed)
-  Orthopedic discharge instructions:  -Plan for maintenance of sling for approximately 6 weeks postoperatively.  This may be removed for activities of daily living and gentle shoulder pendulums and scapular retractions.  She may participate in active elbow and hand and wrist range of motion as tolerated..  -Maintain postoperative bandage until your follow-up appointment.  If this become saturated or soiled you should remove and replace with daily dry dressing.  -You may remove your sling when you are awake and in a chair or in a seated position and allow the elbow to extend.  This will take pressure off the ulnar nerve.  -You should apply ice to the left shoulder for 30 minutes out of each hour that you are awake.  -For mild to moderate pain use Tylenol as needed around-the-clock.  For breakthrough pain use oxycodone as necessary.  -Follow-up with Dr. Aundria Rud in 2 weeks for routine postop care.

## 2020-09-05 NOTE — Anesthesia Postprocedure Evaluation (Signed)
Anesthesia Post Note  Patient: [name]  Procedure(s) Performed: REVERSE SHOULDER ARTHROPLASTY (Left Shoulder)     Patient location during evaluation: PACU Anesthesia Type: General Level of consciousness: awake and alert Pain management: pain level controlled Vital Signs Assessment: post-procedure vital signs reviewed and stable Respiratory status: spontaneous breathing, nonlabored ventilation, respiratory function stable and patient connected to nasal cannula oxygen Cardiovascular status: blood pressure returned to baseline and stable Postop Assessment: no apparent nausea or vomiting Anesthetic complications: no   No complications documented.  Last Vitals:  Vitals:   09/05/20 1553 09/05/20 1607  BP:  132/68  Pulse:  86  Resp:  18  Temp: 36.9 C 36.6 C  SpO2:  97%    Last Pain:  Vitals:   09/05/20 1607  TempSrc: Oral  PainSc:                  Nelle Don Fines [place]

## 2020-09-06 ENCOUNTER — Encounter (HOSPITAL_COMMUNITY): Payer: Self-pay | Admitting: Orthopedic Surgery

## 2020-09-06 DIAGNOSIS — J479 Bronchiectasis, uncomplicated: Secondary | ICD-10-CM

## 2020-09-06 DIAGNOSIS — S12000A Unspecified displaced fracture of first cervical vertebra, initial encounter for closed fracture: Secondary | ICD-10-CM

## 2020-09-06 DIAGNOSIS — I471 Supraventricular tachycardia: Secondary | ICD-10-CM

## 2020-09-06 DIAGNOSIS — Z8781 Personal history of (healed) traumatic fracture: Secondary | ICD-10-CM

## 2020-09-06 DIAGNOSIS — E871 Hypo-osmolality and hyponatremia: Secondary | ICD-10-CM

## 2020-09-06 LAB — BASIC METABOLIC PANEL
Anion gap: 13 (ref 5–15)
BUN: 12 mg/dL (ref 8–23)
CO2: 18 mmol/L — ABNORMAL LOW (ref 22–32)
Calcium: 8.4 mg/dL — ABNORMAL LOW (ref 8.9–10.3)
Chloride: 102 mmol/L (ref 98–111)
Creatinine, Ser: 0.64 mg/dL (ref 0.44–1.00)
GFR, Estimated: >60 mL/min (ref >60)
Glucose, Bld: 106 mg/dL — ABNORMAL HIGH (ref 70–99)
Potassium: 5 mmol/L (ref 3.5–5.1)
Sodium: 133 mmol/L — ABNORMAL LOW (ref 135–145)

## 2020-09-06 MED ORDER — TRAMADOL HCL 50 MG PO TABS
50.0000 mg | ORAL_TABLET | Freq: Four times a day (QID) | ORAL | Status: DC | PRN
  Administered 2020-09-06 – 2020-09-07 (×2): 50 mg via ORAL
  Filled 2020-09-06 (×2): qty 1

## 2020-09-06 MED ORDER — ACETAMINOPHEN 325 MG PO TABS: 650.0000 mg | ORAL_TABLET | Freq: Four times a day (QID) | ORAL | Status: DC | PRN

## 2020-09-06 NOTE — Progress Notes (Signed)
Occupational Therapy Evaluation Patient Details Name: Alexandra Reyes MRN: [id number] DOB: 05-06-1926 Today's Date: 09/06/2020    History of Present Illness Pt is a 87yo female who sustained a fall while raking leaves in her yard and sustained C1/C2/T2 fractures and L humeral head fracture and subluxation, Left REVERSE SHOULDER ARTHROPLASTY 3/39. PMH: COPD, DD, first degree AV block, GERD, hiatal hernia pSH: inguinal hernia repair 2019   Clinical Impression   PTA pt lives alone independently, drives and manages her own medications and finances. Spoke with pt's phone over the phone who plans to stay with his mom 24/7 until his sister arrives. Pt currently requires HHA with mobility and mod A with LB ADL and Max A with UB ADL. Began education on cervical and shoulder precautions - written information provided. Recommend DC home with 24/7 S and HHOT and HH Aide if available. Son plans to be here tomorrow @ 1100 for education. Will follow acutely.     Follow Up Recommendations  Home health OT;HH Aide; Supervision/Assistance - 24 hour    Equipment Recommendations  Tub/shower seat    Recommendations for Other Services       Precautions / Restrictions Precautions Precautions: Fall;Shoulder;Cervical Type of Shoulder Precautions: no ROM shoulder; AROM elbow/wrist/hand Shoulder Interventions: Shoulder sling/immobilizer;Off for dressing/bathing/exercises (Per Dr Aundria Rud OK to remove and use LUE for RW if needed) Precaution Comments: L UE in sling, c-collar Required Braces or Orthoses: Cervical Brace;Sling Cervical Brace: Hard collar;At all times Restrictions Weight Bearing Restrictions: Yes LUE Weight Bearing: Non weight bearing Other Position/Activity Restrictions: OK to remove sling and use LUE with RW if needed      Mobility Bed Mobility Overal bed mobility: Needs Assistance   Rolling: Min guard Sidelying to sit: Min guard       General bed mobility comments: vc for log rolling     Transfers Overall transfer level: Needs assistance   Transfers: Sit to/from Stand Sit to Stand: Min guard              Balance Overall balance assessment: History of Falls;Needs assistance   Sitting balance-Leahy Scale: Good       Standing balance-Leahy Scale: Poor                             ADL either performed or assessed with clinical judgement   ADL Overall ADL's : Needs assistance/impaired Eating/Feeding: Set up;Supervision/ safety;Sitting   Grooming: Minimal assistance;Sitting   Upper Body Bathing: Moderate assistance;Sitting   Lower Body Bathing: Moderate assistance;Sit to/from stand   Upper Body Dressing : Maximal assistance;Sitting   Lower Body Dressing: Moderate assistance;Sit to/from stand   Toilet Transfer: Minimal assistance (HHA)   Toileting- Clothing Manipulation and Hygiene: Moderate assistance       Functional mobility during ADLs: Minimal assistance (HHA) General ADL Comments: shoulder DC education sheet issued. Began education     Vision Baseline Vision/History: Macular Degeneration;Wears glasses Wears Glasses: At all times (uses magnifying glass)       Perception     Praxis      Pertinent Vitals/Pain Pain Assessment: Faces Faces Pain Scale: Hurts little more Pain Location: L shoulder; neck Pain Descriptors / Indicators: Discomfort Pain Intervention(s): Limited activity within patient's tolerance     Hand Dominance Right   Extremity/Trunk Assessment Upper Extremity Assessment Upper Extremity Assessment: LUE deficits/detail LUE Deficits / Details: elbow/wrist/hand AROM WFL; deformities in digits at baseline from arthritis but functional; per MD note previous decreased  ulnar nerve function however ulnar n function appears WFL at this time; current MD note also reports WFL LUE Coordination: decreased gross motor   Lower Extremity Assessment Lower Extremity Assessment: Defer to PT evaluation   Cervical / Trunk  Assessment Cervical / Trunk Assessment: Other exceptions (odontoid and C2 fx; chronic T2 compression fx)   Communication Communication Communication: No difficulties   Cognition Arousal/Alertness: Awake/alert Behavior During Therapy: Impulsive;WFL for tasks assessed/performed Overall Cognitive Status: No family/caregiver present to determine baseline cognitive functioning                                 General Comments: Decreased insight/awareness; decreased STM   General Comments  significant bruising LUE; mod edema around elbow    Exercises Exercises: Shoulder Shoulder Exercises Elbow Flexion: Left;10 reps;AAROM;AROM;Seated Elbow Extension: Left;AROM;AAROM;10 reps;Seated Wrist Flexion: AROM;Left;10 reps;Seated Wrist Extension: AROM;Left;10 reps;Seated Digit Composite Flexion: AROM;Left;10 reps Composite Extension: AROM;Left;10 reps   Shoulder Instructions      Home Living Family/patient expects to be discharged to:: Private residence Living Arrangements: Alone Available Help at Discharge: Family;Available 24 hours/day Type of Home: House Home Access: Stairs to enter [employer] of Steps: 5 Entrance Stairs-Rails: Can reach both Home Layout: One level     Bathroom Shower/Tub: Chief Strategy Officer: Standard Bathroom Accessibility: Yes (unsure)   Home Equipment: Bedside commode;Cane - single point;Grab bars - tub/shower          Prior Functioning/Environment Level of Independence: Independent        Comments: drives; does her medication and financial management        OT Problem List: Decreased strength;Decreased range of motion;Impaired balance (sitting and/or standing);Decreased activity tolerance;Decreased coordination;Impaired vision/perception;Decreased cognition;Decreased safety awareness;Decreased knowledge of use of DME or AE;Impaired UE functional use;Pain;Increased edema      OT Treatment/Interventions:  Self-care/ADL training;Therapeutic exercise;Neuromuscular education;DME and/or AE instruction;Therapeutic activities;Patient/family education;Balance training    OT Goals(Current goals can be found in the care plan section) Acute Rehab OT Goals Patient Stated Goal: to go home OT Goal Formulation: With patient Time For Goal Achievement: 09/20/20 Potential to Achieve Goals: Good  OT Frequency: Min 3X/week   Barriers to D/C:            Co-evaluation              AM-PAC OT "6 Clicks" Daily Activity     Outcome Measure Help from another person eating meals?: A Little Help from another person taking care of personal grooming?: A Little Help from another person toileting, which includes using toliet, bedpan, or urinal?: A Lot Help from another person bathing (including washing, rinsing, drying)?: A Lot Help from another person to put on and taking off regular upper body clothing?: A Lot Help from another person to put on and taking off regular lower body clothing?: A Lot 6 Click Score: 14   End of Session Equipment Utilized During Treatment: Gait belt Nurse Communication: Mobility status;Precautions;Weight bearing status  Activity Tolerance: Patient tolerated treatment well Patient left: in bed;with call bell/phone within reach;with bed alarm set  OT Visit Diagnosis: Unsteadiness on feet (R26.81);Muscle weakness (generalized) (M62.81);History of falling (Z91.81);Pain Pain - Right/Left: Left Pain - part of body: Shoulder (neck)                Time: 3267-1245 OT Time Calculation (min): 34 min Charges:  OT General Charges $OT Visit: 1 Visit OT Evaluation $OT Eval Moderate Complexity: 1  Mod OT Treatments $Therapeutic Exercise: 8-22 mins  Luisa Dago, OT/L   Acute OT Clinical Specialist Acute Rehabilitation Services Pager [phone] Office [phone]   [facility] 09/06/2020, 12:27 PM

## 2020-09-06 NOTE — Progress Notes (Signed)
Physical Therapy Treatment Patient Details Name: Alexandra Reyes MRN: [id number] DOB: 10/10/1925 Today's Date: 09/06/2020    History of Present Illness Pt is a 87yo female who sustained a fall while raking leaves in her yard and sustained C1/C2/T2 fractures and L humeral head fracture and subluxation, Left REVERSE SHOULDER ARTHROPLASTY 3/29. PMH: COPD, DD, first degree AV block, GERD, hiatal hernia pSH: inguinal hernia repair 2019    PT Comments    Despite having a reverse total shoulder yesterday pt in good spirits and able to transfer and ambulate with minA. Pt is at increased falls risk as pt with impaired balance. Pt did well with R HHA assist today but is cleared by MD to use RW if needed. Will trial next session. Pt reports he son and daughter will come home with her and provide 24/7 assist for the next 2-3weeks. I feel pt is safe to return home with this support and HHPT. Acute PT to cont to follow.    Follow Up Recommendations  Home health PT;Supervision/Assistance - 24 hour     Equipment Recommendations  Rolling walker with 5" wheels    Recommendations for Other Services       Precautions / Restrictions Precautions Precautions: Fall;Shoulder;Cervical Type of Shoulder Precautions: no ROM shoulder; AROM elbow/wrist/hand Shoulder Interventions: Shoulder sling/immobilizer;Off for dressing/bathing/exercises (Per Dr Aundria Rud OK to remove and use LUE for RW if needed) Precaution Comments: L UE in sling, c-collar Required Braces or Orthoses: Cervical Brace;Sling Cervical Brace: Hard collar;At all times Restrictions Weight Bearing Restrictions: Yes LUE Weight Bearing: Non weight bearing Other Position/Activity Restrictions: OK to remove sling and use LUE with RW if needed    Mobility  Bed Mobility Overal bed mobility: Needs Assistance Bed Mobility: Supine to Sit Rolling: Min guard Sidelying to sit: Min guard Supine to sit: Min assist     General bed mobility comments: HOB  all the way up, minA with trunk elevation and to bring LEs off EOB    Transfers Overall transfer level: Needs assistance Equipment used: 1 person hand held assist Transfers: Sit to/from Stand Sit to Stand: Min assist         General transfer comment: minA to power up and steady  Ambulation/Gait Ambulation/Gait assistance: Min assist;Mod assist Gait Distance (Feet): 120 Feet Assistive device: 1 person hand held assist Gait Pattern/deviations: Step-through pattern;Decreased stride length;Trunk flexed Gait velocity: appropriate for age   General Gait Details: pt allowed to use RW PRN with L UE however she prefered R HHA today. Pt with 1 episode of LOB during turning requiring modA to prevent fall, slow, short shuffled steps   Stairs             Wheelchair Mobility    Modified Rankin (Stroke Patients Only)       Balance Overall balance assessment: History of Falls;Needs assistance Sitting-balance support: Feet supported;Single extremity supported Sitting balance-Leahy Scale: Good Sitting balance - Comments: pt sat EOB x 8 min to wash face, change gown, and fix sling   Standing balance support: Single extremity supported;During functional activity Standing balance-Leahy Scale: Poor Standing balance comment: requires external support, minA to stand and doff mesh underwear and perform pericare                            Cognition Arousal/Alertness: Awake/alert Behavior During Therapy: WFL for tasks assessed/performed Overall Cognitive Status: No family/caregiver present to determine baseline cognitive functioning  General Comments: decreased insight to safety and deficits, and impaired memory however suspect this to be her baseline      Exercises Shoulder Exercises Elbow Flexion: Left;10 reps;AAROM;AROM;Seated Elbow Extension: Left;AROM;AAROM;10 reps;Seated Wrist Flexion: AROM;Left;10 reps;Seated Wrist  Extension: AROM;Left;10 reps;Seated Digit Composite Flexion: AROM;Left;10 reps Composite Extension: AROM;Left;10 reps    General Comments General comments (skin integrity, edema, etc.): bruising on L shld, dressing intacte      Pertinent Vitals/Pain Pain Assessment: Faces Faces Pain Scale: Hurts little more Pain Location: L shoulder; neck Pain Descriptors / Indicators: Discomfort Pain Intervention(s): Monitored during session    Home Living Family/patient expects to be discharged to:: Private residence Living Arrangements: Alone Available Help at Discharge: Family;Available 24 hours/day Type of Home: House Home Access: Stairs to enter Entrance Stairs-Rails: Can reach both Home Layout: One level Home Equipment: Bedside commode;Cane - single point;Grab bars - tub/shower      Prior Function Level of Independence: Independent      Comments: drives; does her medication and financial management   PT Goals (current goals can now be found in the care plan section) Acute Rehab PT Goals Patient Stated Goal: go home Progress towards PT goals: Progressing toward goals    Frequency    Min 4X/week      PT Plan Current plan remains appropriate    Co-evaluation              AM-PAC PT "6 Clicks" Mobility   Outcome Measure  Help needed turning from your back to your side while in a flat bed without using bedrails?: A Little Help needed moving from lying on your back to sitting on the side of a flat bed without using bedrails?: A Little Help needed moving to and from a bed to a chair (including a wheelchair)?: A Little Help needed standing up from a chair using your arms (e.g., wheelchair or bedside chair)?: A Little Help needed to walk in hospital room?: A Little Help needed climbing 3-5 steps with a railing? : A Lot 6 Click Score: 17    End of Session Equipment Utilized During Treatment: Gait belt;Cervical collar;Other (comment) (L sling) Activity Tolerance: Patient  tolerated treatment well Patient left: in chair;with call bell/phone within reach;with chair alarm set;with nursing/sitter in room Nurse Communication: Mobility status PT Visit Diagnosis: Unsteadiness on feet (R26.81);Difficulty in walking, not elsewhere classified (R26.2);Pain Pain - Right/Left: Left Pain - part of body: Shoulder     Time: 3267-1245 PT Time Calculation (min) (ACUTE ONLY): 29 min  Charges:  $Gait Training: 8-22 mins $Therapeutic Activity: 8-22 mins                     Lewis Shock, PT, DPT Acute Rehabilitation Services Pager #: [phone] Office #: [phone]    Iona Hansen 09/06/2020, 1:37 PM

## 2020-09-06 NOTE — Progress Notes (Signed)
PROGRESS NOTE    Alexandra Reyes  VPX:[id number] DOB: 1925-07-27 DOA: 09/03/2020 PCP: Corwin Levins, MD    Chief Complaint  Patient presents with  . Fall    Brief Narrative:   87yo w/ a hx of AVNRT status post ablation, HTN, HLD, COPD, macular degeneration, and bronchiectasis who suffered a mechanical fall at home and presented to the ER.  There was no loss of consciousness.  CT noted C1/C2/T2 fractures as well as a left humeral head fracture with subluxation.  Subjective:   Post day one, currently denies pain She is sitting up in chair with neck collar on and left shoulder sling She is aaox3 with short term memory impairment, reports that is baseline She reports h/o bronchiectasis, no cough, no sob, she is on room air  Assessment & Plan:   Principal Problem:   Cervical spine fracture (HCC) Active Problems:   Paroxysmal SVT (supraventricular tachycardia) (HCC)   ILD (interstitial lung disease) (HCC)   C1 cervical fracture (HCC)  C1 anterior arch fracture, type II odontoid fracture -Evaluated by neurosurgery, recommended cervical collar, follow-up with neurosurgery in 2 weeks  Left humeral head comminuted fracture with subluxation -s/p  Left REVERSE SHOULDER ARTHROPLASTY on 3/29 -follow ortho recommendations:  "--Sling at all times other than for activities of daily living or exercise of the hand, wrist, and elbow.  She also may use the left upper extremity for rolling walker when needed. -She may remove the sling to rest her neck and allow elbow extension while lying in the bed with a pillow behind the shoulder and humerus. -Maintain postoperative bandage until follow-up.  She may shower with this in place. -Follow-up with me in 2 weeks."  Chronic appearing T2 compression fracture. This is new from prior exam of 2020 but of uncertain chronicity.  Does not appear to have  point tenderness   Hyponatremia Received gentle hydration, sodium normalized   2 cm  hypodense nodule within the right lobe of the thyroid. This is stable from a prior exam of 2020 and given the patient's age no follow-up recommended unless clinically warranted (ref: J Am Coll Radiol. 2015 Feb;12(2): 143-50).  VNRT status post ablation Continue chronic Cardizem - in NSR   HTN Blood pressure well controlled  HLD Continue home medical therapy  COPD with bronchiectasis Continue usual inhaled medications -well compensated at present     Unresulted Labs (From admission, onward)          Start     Ordered   09/07/20 0500  Magnesium  Tomorrow morning,   R       Question:  Specimen collection method  Answer:  Lab=Lab collect   09/06/20 1859   09/07/20 0500  CBC with Differential/Platelet  Tomorrow morning,   R       Question:  Specimen collection method  Answer:  Lab=Lab collect   09/06/20 1859   09/07/20 0500  Basic metabolic panel  Tomorrow morning,   R       Question:  Specimen collection method  Answer:  Lab=Lab collect   09/06/20 1900            DVT prophylaxis: SCD's Start: 09/05/20 1625 SCDs Start: 09/04/20 0032   Code Status: Full Family Communication: Patient Disposition:   Status is: Inpatient  Dispo: The patient is from: Home              Anticipated d/c is to: Home with home health  Anticipated d/c date is: Likely tomorrow                Consultants:   Neurosurgery  Orthopedic surgery  Procedures:   Left shoulder surgery  Antimicrobials:   Anti-infectives (From admission, onward)   Start     Dose/Rate Route Frequency Ordered Stop   09/05/20 2130  ceFAZolin (ANCEF) IVPB 1 g/50 mL premix        1 g 100 mL/hr over 30 Minutes Intravenous Every 8 hours 09/05/20 1522 09/06/20 1634   09/05/20 1435  vancomycin (VANCOCIN) powder  Status:  Discontinued          As needed 09/05/20 1436 09/05/20 1513   09/05/20 0600  ceFAZolin (ANCEF) IVPB 2g/100 mL premix        2 g 200 mL/hr over 30 Minutes Intravenous To Short Stay  09/04/20 2004 09/05/20 1320          Objective: Vitals:   09/05/20 1959 09/06/20 0100 09/06/20 0500 09/06/20 1503  BP:  128/70 128/73 120/67  Pulse:  91 99 80  Resp:  18 18 18   Temp:  98.4 F (36.9 C) 98.8 F (37.1 C) 98.1 F (36.7 C)  TempSrc:  Axillary Axillary Oral  SpO2: 98% 98% 99% 97%  Weight:      Height:        Intake/Output Summary (Last 24 hours) at 09/06/2020 1900 Last data filed at 09/06/2020 1604 Gross per 24 hour  Intake 690 ml  Output --  Net 690 ml   Filed Weights   09/03/20 1949  Weight: 56.2 kg    Examination:  General exam: calm, NAD, neck collar on Respiratory system: Clear to auscultation. Respiratory effort normal. Cardiovascular system: S1 & S2 heard, RRR. No JVD, no murmur, No pedal edema. Gastrointestinal system: Abdomen is nondistended, soft and nontender.  Normal bowel sounds heard. Central nervous system: Alert and oriented. No focal neurological deficits. Extremities: left shoulder post op changes, sling in place Skin: No rashes, lesions or ulcers Psychiatry: Judgement and insight appear normal. Mood & affect appropriate.     Data Reviewed: I have personally reviewed following labs and imaging studies  CBC: Recent Labs  Lab 09/03/20 1856 09/04/20 0436 09/05/20 0257  WBC 17.3* 12.1* 14.6*  HGB 15.0 13.6 12.4  HCT 43.6 39.3 36.3  MCV 86.3 86.9 87.7  PLT 359 317 291    Basic Metabolic Panel: Recent Labs  Lab 09/03/20 1856 09/04/20 0436 09/05/20 0257 09/06/20 0358  NA 126* 127* 129* 133*  K 5.6* 3.7 3.2* 5.0  CL 95* 99 98 102  CO2 17* 18* 22 18*  GLUCOSE 110* 118* 139* 106*  BUN 17 13 8 12   CREATININE 0.94 0.79 0.55 0.64  CALCIUM 8.8* 8.5* 8.2* 8.4*    GFR: Estimated Creatinine Clearance: 37.1 mL/min (by C-G formula based on SCr of 0.64 mg/dL).  Liver Function Tests: Recent Labs  Lab 09/05/20 0257  AST 23  ALT 16  ALKPHOS 63  BILITOT 1.0  PROT 5.7*  ALBUMIN 3.0*    CBG: No results for input(s):  GLUCAP in the last 168 hours.   Recent Results (from the past 240 hour(s))  Resp Panel by RT-PCR (Flu A&B, Covid) Nasopharyngeal Swab     Status: None   Collection Time: 09/03/20  8:35 PM   Specimen: Nasopharyngeal Swab; Nasopharyngeal(NP) swabs in vial transport medium  Result Value Ref Range Status   SARS Coronavirus 2 by RT PCR NEGATIVE NEGATIVE Final    Comment: (NOTE) SARS-CoV-2  target nucleic acids are NOT DETECTED.  The SARS-CoV-2 RNA is generally detectable in upper respiratory specimens during the acute phase of infection. The lowest concentration of SARS-CoV-2 viral copies this assay can detect is 138 copies/mL. A negative result does not preclude SARS-Cov-2 infection and should not be used as the sole basis for treatment or other patient management decisions. A negative result may occur with  improper specimen collection/handling, submission of specimen other than nasopharyngeal swab, presence of viral mutation(s) within the areas targeted by this assay, and inadequate number of viral copies(<138 copies/mL). A negative result must be combined with clinical observations, patient history, and epidemiological information. The expected result is Negative.  Fact Sheet for Patients:  BloggerCourse.comhttps://www.fda.gov/media/152166/download  Fact Sheet for Healthcare Providers:  SeriousBroker.ithttps://www.fda.gov/media/152162/download  This test is no t yet approved or cleared by the [place] FDA and  has been authorized for detection and/or diagnosis of SARS-CoV-2 by FDA under an Emergency Use Authorization (EUA). This EUA will remain  in effect (meaning this test can be used) for the duration of the COVID-19 declaration under Section 564(b)(1) of the Act, 21 U.S.C.section 360bbb-3(b)(1), unless the authorization is terminated  or revoked sooner.       Influenza A by PCR NEGATIVE NEGATIVE Final   Influenza B by PCR NEGATIVE NEGATIVE Final    Comment: (NOTE) The Xpert Xpress SARS-CoV-2/FLU/RSV  plus assay is intended as an aid in the diagnosis of influenza from Nasopharyngeal swab specimens and should not be used as a sole basis for treatment. Nasal washings and aspirates are unacceptable for Xpert Xpress SARS-CoV-2/FLU/RSV testing.  Fact Sheet for Patients: BloggerCourse.comhttps://www.fda.gov/media/152166/download  Fact Sheet for Healthcare Providers: SeriousBroker.ithttps://www.fda.gov/media/152162/download  This test is not yet approved or cleared by the [place] FDA and has been authorized for detection and/or diagnosis of SARS-CoV-2 by FDA under an Emergency Use Authorization (EUA). This EUA will remain in effect (meaning this test can be used) for the duration of the COVID-19 declaration under Section 564(b)(1) of the Act, 21 U.S.C. section 360bbb-3(b)(1), unless the authorization is terminated or revoked.  Performed at [facility], [address] [place], [place] [address]   Surgical PCR screen     Status: None   Collection Time: 09/04/20 10:23 AM   Specimen: Nasal Mucosa; Nasal Swab  Result Value Ref Range Status   MRSA, PCR NEGATIVE NEGATIVE Final   Staphylococcus aureus NEGATIVE NEGATIVE Final    Comment: (NOTE) The Xpert SA Assay (FDA approved for NASAL specimens in patients 85 years of age and older), is one component of a comprehensive surveillance program. It is not intended to diagnose infection nor to guide or monitor treatment. Performed at [facility], [address] [place], [place] [address]          Radiology Studies: DG Shoulder Left Port  Result Date: 09/05/2020 CLINICAL DATA:  Status post left total shoulder arthroplasty. EXAM: LEFT SHOULDER COMPARISON:  September 03, 2020. FINDINGS: The left glenoid and humeral components appear to be well situated. IMPRESSION: Status post left total shoulder arthroplasty. Electronically Signed   By: Lupita RaiderJames  Green Jr M.D.   On: 09/05/2020 16:41        Scheduled Meds: . amLODipine  5 mg Oral Daily  . diltiazem   120 mg Oral Daily  . docusate sodium  100 mg Oral BID  . gabapentin  100 mg Oral TID  . mometasone-formoterol  2 puff Inhalation BID  . mupirocin ointment  1 application Nasal BID  . pantoprazole  40 mg Oral Daily  . simvastatin  10 mg Oral Daily   Continuous Infusions: . sodium chloride 50 mL/hr at 09/05/20 2120     LOS: 2 days   Time spent: Greater than 50% of this time was spent in counseling, explanation of diagnosis, planning of further management, and coordination of care.   Voice Recognition Reubin Milan dictation system was used to create this note, attempts have been made to correct errors. Please contact the author with questions and/or clarifications.   Albertine Grates, MD PhD FACP [facility]  Available via Epic secure chat 7am-7pm for nonurgent issues Please page for urgent issues To page the attending provider between 7A-7P or the covering provider during after hours 7P-7A, please log into the web site [email or web address] and access using universal [place] password for that web site. If you do not have the password, please call the hospital operator.    09/06/2020, 7:00 PM

## 2020-09-06 NOTE — Progress Notes (Signed)
   Subjective:  Patient reports pain as mild.  Doing well today with no specific complaints.  No chest pain or shortness of breath.  No numbness or tingling in the left arm.  Objective:   VITALS:   Vitals:   09/05/20 1959 09/06/20 0100 09/06/20 0500 09/06/20 1503  BP:  128/70 128/73 120/67  Pulse:  91 99 80  Resp:  18 18 18   Temp:  98.4 F (36.9 C) 98.8 F (37.1 C) 98.1 F (36.7 C)  TempSrc:  Axillary Axillary Oral  SpO2: 98% 98% 99% 97%  Weight:      Height:        Neurologically intact Sensation intact distally Intact pulses distally Incision: dressing C/D/I Sling in place  Lab Results  Component Value Date   WBC 14.6 (H) 09/05/2020   HGB 12.4 09/05/2020   HCT 36.3 09/05/2020   MCV 87.7 09/05/2020   PLT 291 09/05/2020   BMET    Component Value Date/Time   NA 133 (L) 09/06/2020 0358   NA 136 07/17/2016 1029   K 5.0 09/06/2020 0358   CL 102 09/06/2020 0358   CO2 18 (L) 09/06/2020 0358   GLUCOSE 106 (H) 09/06/2020 0358   BUN 12 09/06/2020 0358   BUN 14 07/17/2016 1029   CREATININE 0.64 09/06/2020 0358   CREATININE 1.11 (H) 02/14/2016 1234   CALCIUM 8.4 (L) 09/06/2020 0358   GFRNONAA >60 09/06/2020 0358   GFRAA 58 (L) 06/25/2017 1034     Assessment/Plan: 1 Day Post-Op   Principal Problem:   Cervical spine fracture (HCC) Active Problems:   Paroxysmal SVT (supraventricular tachycardia) (HCC)   ILD (interstitial lung disease) (HCC)   C1 cervical fracture (HCC)   Up with therapy -Sling at all times other than for activities of daily living or exercise of the hand, wrist, and elbow.  She also may use the left upper extremity for rolling walker when needed.  -She may remove the sling to rest her neck and allow elbow extension while lying in the bed with a pillow behind the shoulder and humerus.  -Maintain postoperative bandage until follow-up.  She may shower with this in place.  -Follow-up with me in 2 weeks.   06/27/2017 09/06/2020,  3:30 PM   09/08/2020, MD [phone]

## 2020-09-07 LAB — CBC WITH DIFFERENTIAL/PLATELET
Abs Immature Granulocytes: 0.08 10*3/uL — ABNORMAL HIGH (ref 0.00–0.07)
Basophils Absolute: 0 10*3/uL (ref 0.0–0.1)
Basophils Relative: 0 %
Eosinophils Absolute: 0 10*3/uL (ref 0.0–0.5)
Eosinophils Relative: 0 %
HCT: 33 % — ABNORMAL LOW (ref 36.0–46.0)
Hemoglobin: 11.2 g/dL — ABNORMAL LOW (ref 12.0–15.0)
Immature Granulocytes: 1 %
Lymphocytes Relative: 9 %
Lymphs Abs: 1.2 10*3/uL (ref 0.7–4.0)
MCH: 29.8 pg (ref 26.0–34.0)
MCHC: 33.9 g/dL (ref 30.0–36.0)
MCV: 87.8 fL (ref 80.0–100.0)
Monocytes Absolute: 1.4 10*3/uL — ABNORMAL HIGH (ref 0.1–1.0)
Monocytes Relative: 10 %
Neutro Abs: 10.7 10*3/uL — ABNORMAL HIGH (ref 1.7–7.7)
Neutrophils Relative %: 80 %
Platelets: 300 10*3/uL (ref 150–400)
RBC: 3.76 MIL/uL — ABNORMAL LOW (ref 3.87–5.11)
RDW: 14.2 % (ref 11.5–15.5)
WBC: 13.4 10*3/uL — ABNORMAL HIGH (ref 4.0–10.5)
nRBC: 0 % (ref 0.0–0.2)

## 2020-09-07 LAB — BASIC METABOLIC PANEL
Anion gap: 9 (ref 5–15)
BUN: 8 mg/dL (ref 8–23)
CO2: 20 mmol/L — ABNORMAL LOW (ref 22–32)
Calcium: 8 mg/dL — ABNORMAL LOW (ref 8.9–10.3)
Chloride: 101 mmol/L (ref 98–111)
Creatinine, Ser: 0.51 mg/dL (ref 0.44–1.00)
GFR, Estimated: >60 mL/min (ref >60)
Glucose, Bld: 99 mg/dL (ref 70–99)
Potassium: 3.4 mmol/L — ABNORMAL LOW (ref 3.5–5.1)
Sodium: 130 mmol/L — ABNORMAL LOW (ref 135–145)

## 2020-09-07 LAB — MAGNESIUM: Magnesium: 1.9 mg/dL (ref 1.7–2.4)

## 2020-09-07 NOTE — Care Management Important Message (Signed)
Important Message  Patient Details  Name: EMALYNN CLEWIS MRN: [id number] Date of Birth: January 30, 1926   Medicare Important Message Given:  Yes     Sayre Witherington P Younique Casad 09/07/2020, 11:53 AM

## 2020-09-07 NOTE — Progress Notes (Signed)
Provided discharge education/instructions to Pt and Pt's daughter and son, all questions and concerns addressed, Pt not in distress, discharged home with DME and belongings accompanied by family.

## 2020-09-07 NOTE — Progress Notes (Signed)
Verified with lab that am blood draw was done.

## 2020-09-07 NOTE — Plan of Care (Signed)

## 2020-09-07 NOTE — Plan of Care (Signed)

## 2020-09-07 NOTE — Discharge Summary (Signed)
Discharge Summary  Alexandra Reyes:[id number] DOB: 02/27/1926  PCP: Corwin Levins, MD  Admit date: 09/03/2020 Discharge date: 09/07/2020  Time spent:  Recommendations for Outpatient Follow-up:  1. F/u with PCP within a week  for hospital discharge follow up, repeat cbc/bmp at follow up 2. F/u with orthopedics Dr Aundria Rud 3. F/u with neurosurgery 4. Home health with 24/7 care at home   Discharge Diagnoses:  Active Hospital Problems   Diagnosis Date Noted  . Cervical spine fracture (HCC) 09/03/2020  . C1 cervical fracture (HCC) 09/04/2020  . ILD (interstitial lung disease) (HCC) 05/22/2016  . Paroxysmal SVT (supraventricular tachycardia) (HCC) 02/07/2011    Resolved Hospital Problems  No resolved problems to display.    Discharge Condition: stable  Diet recommendation: regular diet  Filed Weights   09/03/20 1949  Weight: 56.2 kg    History of present illness: (per admitting MD Dr Toniann Fail) Chief Complaint: Fall.  HPI: Alexandra Reyes is a 87 y.o. female with history of AVNRT status post ablation, hypertension, hyperlipidemia, bronchiectasis had a fall at home while she tripped on some a cone on the ER.  Hit her head but did not lose consciousness.  Was brought to the ER.  ED Course: CT scan shows C1/C2/T12 fractures and also x-rays revealed left humeral head fracture with subluxation.  On-call neurosurgery was consulted requested cervical collar placement and on-call orthopedic surgeon Dr. Neurologist was consulted requested follow-up with outpatient.  Given the significant pain at this time patient is admitted for further observation.  Covid test was negative labs are hyponatremia with sodium 126 potassium 5.6 Covid test negative.  WBC count was 17.3.  Hospital Course:  Principal Problem:   Cervical spine fracture (HCC) Active Problems:   Paroxysmal SVT (supraventricular tachycardia) (HCC)   ILD (interstitial lung disease) (HCC)   C1 cervical fracture  (HCC)  C1 anterior arch fracture, type II odontoid fracture -Evaluated by neurosurgery, recommended cervical collar, follow-up with neurosurgery in 2 weeks  Left humeral head comminuted fracture with subluxation -s/p LeftREVERSE SHOULDER ARTHROPLASTY on 3/29 -follow ortho recommendations:  "--Sling at all times other than for activities of daily living or exercise of the hand, wrist, and elbow. She also may use the left upper extremity for rolling walker when needed. -She may remove the sling to rest her neck and allow elbow extension while lying in the bed with a pillow behind the shoulder and humerus. -Maintain postoperative bandage until follow-up. She may shower with this in place. -Follow-up with me in 2 weeks."  Chronic appearing T2 compression fracture. This is new from prior exam of 2020 but of uncertain chronicity.  Does not appear to have  point tenderness   Hyponatremia Acute on chronic Received gentle hydration, sodium improved, sodium 126 on presentation, sodium 130 at discharge F/u with pcp , repeat bmp at follow up   2 cm hypodense nodule within the right lobe of the thyroid. This is stable from a prior exam of 2020 and given the patient's age no follow-up recommended unless clinically warranted (ref: J Am Coll Radiol. 2015 Feb;12(2): 143-50).  VNRT status post ablation Continue chronic Cardizem- in NSR F/u with cardiology  HTN Blood pressure well controlled  HLD Continue home medical therapy  COPD with bronchiectasis Continue usual inhaled medications-well compensated at present   Code Status: Full Family Communication: daughter and son at bedside    Consultants:   Neurosurgery  Orthopedic surgery  Procedures:   Left shoulder surgery    Discharge Exam:  BP 125/79 (BP Location: Right Arm)   Pulse (!) 102   Temp 98.2 F (36.8 C) (Oral)   Resp 18   Ht 5\' 3"  (1.6 m)   Wt 56.2 kg   LMP  (LMP Unknown)   SpO2 96%   BMI  21.97 kg/m   General: NAD, aaox3, + neck collar, + left  shoulder sling Cardiovascular: RRR Respiratory: CTABL  Discharge Instructions You were cared for by a hospitalist during your hospital stay. If you have any questions about your discharge medications or the care you received while you were in the hospital after you are discharged, you can call the unit and asked to speak with the hospitalist on call if the hospitalist that took care of you is not available. Once you are discharged, your primary care physician will handle any further medical issues. Please note that NO REFILLS for any discharge medications will be authorized once you are discharged, as it is imperative that you return to your primary care physician (or establish a relationship with a primary care physician if you do not have one) for your aftercare needs so that they can reassess your need for medications and monitor your lab values.  Discharge Instructions    Diet general   Complete by: As directed    Discharge wound care:   Complete by: As directed    Reinforce dressing, follow up with orthopedics   Increase activity slowly   Complete by: As directed      Allergies as of 09/07/2020   No Known Allergies     Medication List    TAKE these medications   acetaminophen 500 MG tablet Commonly known as: TYLENOL Take 1,000 mg by mouth 2 (two) times daily as needed for moderate pain or headache.   ALPRAZolam 0.25 MG tablet Commonly known as: XANAX TAKE ONE TABLET BY MOUTH EVERY NIGHT AT BEDTIME AS NEEDED FOR ANXIETY What changed: See the new instructions.   amLODipine 5 MG tablet Commonly known as: NORVASC TAKE ONE TABLET BY MOUTH DAILY   aspirin 81 MG tablet Take 81 mg by mouth at bedtime.   budesonide-formoterol 80-4.5 MCG/ACT inhaler Commonly known as: Symbicort INHALE TWO PUFFS BY MOUTH TWICE A DAY *WAIT 20 MINUTES BETWEEN EACH PUFF* What changed:   how much to take  how to take this  when to take  this  additional instructions   calcium carbonate 600 MG tablet Commonly known as: OS-CAL Take 600 mg by mouth daily.   Cartia XT 120 MG 24 hr capsule Generic drug: diltiazem TAKE ONE CAPSULE BY MOUTH DAILY What changed: how much to take   diltiazem 30 MG tablet Commonly known as: CARDIZEM Take 1 tablet (30 mg total) by mouth 4 (four) times daily as needed. What changed: reasons to take this   Flutter Devi Use as directed   furosemide 40 MG tablet Commonly known as: LASIX TAKE ONE TABLET BY MOUTH DAILY What changed:   how much to take  how to take this  when to take this  additional instructions   gabapentin 100 MG capsule Commonly known as: NEURONTIN TAKE ONE CAPSULE BY MOUTH THREE TIMES A DAY   GLUCOSAMINE-MSM PO Take 2 tablets by mouth daily.   nitroGLYCERIN 0.4 MG SL tablet Commonly known as: NITROSTAT Place 1 tablet (0.4 mg total) under the tongue every 5 (five) minutes as needed for chest pain.   omeprazole 40 MG capsule Commonly known as: PRILOSEC TAKE ONE CAPSULE BY MOUTH DAILY   oxyCODONE  5 MG immediate release tablet Commonly known as: Roxicodone Take 1 tablet (5 mg total) by mouth every 8 (eight) hours as needed.   simvastatin 10 MG tablet Commonly known as: ZOCOR Take 1 tablet (10 mg total) by mouth daily. What changed: when to take this   traMADol 50 MG tablet Commonly known as: ULTRAM TAKE ONE TABLET BY MOUTH TWICE A DAY AS NEEDED What changed:   when to take this  reasons to take this   triamcinolone 0.1 % Commonly known as: KENALOG Apply 1 application topically daily as needed (hives). Apply to affected area   VITAMIN B COMPLEX PO Take 1 tablet by mouth daily.   VITAMIN C PO Take 500 mg by mouth daily. Reported on 08/08/2015   vitamin E 200 UNIT capsule Take 200 Units by mouth daily.            Durable Medical Equipment  (From admission, onward)         Start     Ordered   09/06/20 1551  For home use only DME  Walker rolling  Once       Question Answer Comment  Walker: With 5 Inch Wheels   Patient needs a walker to treat with the following condition Weakness      09/06/20 1551   09/06/20 1551  For home use only DME Other see comment  Once       Comments: Shower chair  Question:  Length of Need  Answer:  12 Months   09/06/20 1551   09/06/20 1537  For home use only DME Walker rolling  Once       Question Answer Comment  Walker: With 5 Inch Wheels   Patient needs a walker to treat with the following condition Fx      09/06/20 1537           Discharge Care Instructions  (From admission, onward)         Start     Ordered   09/07/20 0000  Discharge wound care:       Comments: Reinforce dressing, follow up with orthopedics   09/07/20 1000         No Known Allergies  Follow-up Information    Yolonda Kida, MD In 2 weeks.   Specialty: Orthopedic Surgery Why: For suture removal Contact information: [address] [place] [address] [phone]        [facility] Follow up.   Specialty: Home Health Services Why: [facility] will provide home heakth PT and OT services. Start of care will be within 48 hours post discharge       [facility] .        Costella, Darci Current, PA-C Follow up.   Specialty: Physician Assistant Why:  2 weeks for repeat Xrays for monitoring C1 anterior arch fracture, type II odontoid fracture Contact information: [address] [place] [address] [phone]        Corwin Levins, MD Follow up in 1 week(s).   Specialties: Internal Medicine, Radiology Why: hospital discharge follow up, repeat cbc/bmp at follow up Contact information: [address] [place] [address] [phone]                The results of significant diagnostics from this hospitalization (including imaging, microbiology, ancillary and laboratory) are listed below for reference.     Significant Diagnostic Studies: CT Head Wo Contrast  Result Date: 09/03/2020 CLINICAL DATA:  Fall EXAM: CT HEAD WITHOUT CONTRAST CT CERVICAL SPINE WITHOUT CONTRAST TECHNIQUE: Multidetector CT imaging of the head and cervical spine was performed following the standard protocol without intravenous contrast. Multiplanar CT image reconstructions of the cervical spine were also generated. COMPARISON:  None. FINDINGS: CT HEAD FINDINGS Brain: There is no mass, hemorrhage or extra-axial collection. The size and configuration of the ventricles and extra-axial CSF spaces are normal. The brain parenchyma is normal, without evidence of acute or chronic infarction. Vascular: Atherosclerotic calcification of the internal carotid arteries at the skull base. No abnormal hyperdensity of the major intracranial arteries or dural venous sinuses. Skull: The visualized skull base, calvarium and extracranial soft tissues are normal. Sinuses/Orbits: No fluid levels or advanced mucosal thickening of the visualized paranasal sinuses. No mastoid or middle ear effusion. The orbits are normal. CT CERVICAL SPINE FINDINGS Alignment: Grade 1 anterolisthesis at C4-5 and C5-6 Skull base and vertebrae: There is hypertrophy and mineralization at the C1-2 level. There is a large, rounded area of lucency through the odontoid process. There is a type 2 fracture through this region with minimal displacement. Additionally, there is a minimally displaced fracture of the midline anterior arch of C1. there is a compression fracture of T2 with approximately 25% height loss but no retropulsion. Soft tissues and spinal canal: No prevertebral fluid or swelling. No visible canal hematoma. Disc levels: No bony spinal canal stenosis Upper chest: No pneumothorax, pulmonary nodule or pleural effusion. Other: Normal visualized paraspinal cervical soft tissues. IMPRESSION: 1. Minimally displaced anterior C1 arch and type 2 odontoid process fractures. 2. No acute  intracranial abnormality. 3. Moderate hypertrophic arthritic change at the atlantoaxial joint, which could be due to rheumatoid arthritis, gout or CPPD. 4. T2 compression fracture with 25% height loss but no retropulsion. Critical Value/emergent results were called by telephone at the time of interpretation on 09/03/2020 at 8:09 pm to provider MATTHEW TRIFAN , who verbally acknowledged these results. Electronically Signed   By: Deatra RobinsonKevin  Herman M.D.   On: 09/03/2020 20:10   CT Chest Wo Contrast  Result Date: 09/03/2020 CLINICAL DATA:  Recent fall with known proximal left humeral fracture and chest pain, evaluate for possible rib fracture EXAM: CT CHEST WITHOUT CONTRAST TECHNIQUE: Multidetector CT imaging of the chest was performed following the standard protocol without IV contrast. COMPARISON:  12/28/2018 FINDINGS: Cardiovascular: Thoracic aorta and its branches demonstrate diffuse atherosclerotic calcifications. No aneurysmal dilatation is identified. Coronary calcifications are seen. Pulmonary artery is not significantly enlarged. Mediastinum/Nodes: Thoracic inlet is within normal limits with the exception of a 2 cm hypodense nodule within the right lobe of the thyroid bridging into the isthmus. No sizable hilar or mediastinal adenopathy is noted. No esophageal abnormality is seen. Lungs/Pleura: Lungs are well aerated bilaterally. No focal infiltrate or sizable effusion is seen. Minimal right basilar atelectasis is noted. No sizable effusion or pneumothorax is seen. Upper Abdomen: Renal cystic change is noted and stable from the prior exam. Remainder of the upper abdomen appears within normal limits. Musculoskeletal: Compression deformity is noted at T2 which is new from the prior exam of 2020 but of uncertain chronicity. No other compression deformities are seen. Sternum is unremarkable. Comminuted proximal left humeral fracture is again seen stable from prior plain films. No acute rib fracture is noted.  IMPRESSION: Stable left humeral fracture. No definitive rib abnormality is seen. Chronic appearing T2 compression fracture. This is new from prior exam of 2020 but of uncertain chronicity. Correlation to point tenderness is recommended. 2 cm hypodense  nodule within the right lobe of the thyroid. This is stable from a prior exam of 2020 and given the patient's age no follow-up recommended unless clinically warranted (ref: J Am Coll Radiol. 2015 Feb;12(2): 143-50). Aortic Atherosclerosis (ICD10-I70.0). Electronically Signed   By: Alcide Clever M.D.   On: 09/03/2020 19:58   CT Cervical Spine Wo Contrast  Result Date: 09/03/2020 CLINICAL DATA:  Fall EXAM: CT HEAD WITHOUT CONTRAST CT CERVICAL SPINE WITHOUT CONTRAST TECHNIQUE: Multidetector CT imaging of the head and cervical spine was performed following the standard protocol without intravenous contrast. Multiplanar CT image reconstructions of the cervical spine were also generated. COMPARISON:  None. FINDINGS: CT HEAD FINDINGS Brain: There is no mass, hemorrhage or extra-axial collection. The size and configuration of the ventricles and extra-axial CSF spaces are normal. The brain parenchyma is normal, without evidence of acute or chronic infarction. Vascular: Atherosclerotic calcification of the internal carotid arteries at the skull base. No abnormal hyperdensity of the major intracranial arteries or dural venous sinuses. Skull: The visualized skull base, calvarium and extracranial soft tissues are normal. Sinuses/Orbits: No fluid levels or advanced mucosal thickening of the visualized paranasal sinuses. No mastoid or middle ear effusion. The orbits are normal. CT CERVICAL SPINE FINDINGS Alignment: Grade 1 anterolisthesis at C4-5 and C5-6 Skull base and vertebrae: There is hypertrophy and mineralization at the C1-2 level. There is a large, rounded area of lucency through the odontoid process. There is a type 2 fracture through this region with minimal  displacement. Additionally, there is a minimally displaced fracture of the midline anterior arch of C1. there is a compression fracture of T2 with approximately 25% height loss but no retropulsion. Soft tissues and spinal canal: No prevertebral fluid or swelling. No visible canal hematoma. Disc levels: No bony spinal canal stenosis Upper chest: No pneumothorax, pulmonary nodule or pleural effusion. Other: Normal visualized paraspinal cervical soft tissues. IMPRESSION: 1. Minimally displaced anterior C1 arch and type 2 odontoid process fractures. 2. No acute intracranial abnormality. 3. Moderate hypertrophic arthritic change at the atlantoaxial joint, which could be due to rheumatoid arthritis, gout or CPPD. 4. T2 compression fracture with 25% height loss but no retropulsion. Critical Value/emergent results were called by telephone at the time of interpretation on 09/03/2020 at 8:09 pm to provider MATTHEW TRIFAN , who verbally acknowledged these results. Electronically Signed   By: Deatra Robinson M.D.   On: 09/03/2020 20:10   CT HUMERUS LEFT WO CONTRAST  Result Date: 09/03/2020 CLINICAL DATA:  Evaluate right proximal humeral fracture EXAM: CT OF THE UPPER LEFT EXTREMITY WITHOUT CONTRAST TECHNIQUE: Multidetector CT imaging of the upper left extremity was performed according to the standard protocol. COMPARISON:  X-ray 09/03/2020 FINDINGS: Bones/Joint/Cartilage Acute comminuted fracture of the left humeral head and neck. Transverse component of the surgical neck with approximately 1.2 cm of anterior displacement. Greater tuberosity fracture component with approximately 0.6 cm of posterosuperior displacement. Minimally displaced fracture involves the inferior margin of the lesser tuberosity. No fracture involvement of the humeral head articular surface. Inferior subluxation of the humeral head likely secondary to space-occupying joint effusion/hemarthrosis. No dislocation. No additional fractures. Mild glenohumeral  and acromioclavicular osteoarthritis. Elbow joint intact without fracture or dislocation. Ligaments Suboptimally assessed by CT. Muscles and Tendons Preserved bulk of the rotator cuff musculature. Limited evaluation of the rotator cuff tendons. Soft tissues Prominent soft tissue swelling at the fracture site. No organized fluid collection/hematoma. Visualized portion of the left lung field is clear. IMPRESSION: 1. Acute comminuted fracture of the left  humeral head and neck, as described above. 2. Inferior subluxation of the humeral head likely secondary to space-occupying joint effusion/hemarthrosis. Electronically Signed   By: Duanne Guess D.O.   On: 09/03/2020 20:04   DG Shoulder Left  Result Date: 09/03/2020 CLINICAL DATA:  Status post fall while raking leaves. EXAM: LEFT SHOULDER - 2+ VIEW COMPARISON:  None FINDINGS: There is an acute, comminuted impacted fracture deformity involving surgical neck and greater tuberosity of the proximal left humerus. No signs of dislocation. Overlying soft tissue swelling noted. IMPRESSION: Acute, comminuted impacted fracture deformity involving the surgical neck and greater tuberosity of the proximal left humerus. Electronically Signed   By: Signa Kell M.D.   On: 09/03/2020 18:34   DG Shoulder Left Port  Result Date: 09/05/2020 CLINICAL DATA:  Status post left total shoulder arthroplasty. EXAM: LEFT SHOULDER COMPARISON:  September 03, 2020. FINDINGS: The left glenoid and humeral components appear to be well situated. IMPRESSION: Status post left total shoulder arthroplasty. Electronically Signed   By: Lupita Raider M.D.   On: 09/05/2020 16:41   DG Humerus Left  Result Date: 09/03/2020 CLINICAL DATA:  Fall raking leaves today.  Left shoulder pain. EXAM: LEFT HUMERUS - 2+ VIEW COMPARISON:  None. FINDINGS: Comminuted and displaced proximal humerus fracture. Dominant fracture plane is through the surgical neck. There is displacement of the greater trochanter of 4  mm. No definite glenohumeral extension. Distal humerus is intact. IMPRESSION: Comminuted and displaced proximal humerus fracture involving the surgical neck and greater trochanter. Electronically Signed   By: Narda Rutherford M.D.   On: 09/03/2020 18:32    Microbiology: Recent Results (from the past 240 hour(s))  Resp Panel by RT-PCR (Flu A&B, Covid) Nasopharyngeal Swab     Status: None   Collection Time: 09/03/20  8:35 PM   Specimen: Nasopharyngeal Swab; Nasopharyngeal(NP) swabs in vial transport medium  Result Value Ref Range Status   SARS Coronavirus 2 by RT PCR NEGATIVE NEGATIVE Final    Comment: (NOTE) SARS-CoV-2 target nucleic acids are NOT DETECTED.  The SARS-CoV-2 RNA is generally detectable in upper respiratory specimens during the acute phase of infection. The lowest concentration of SARS-CoV-2 viral copies this assay can detect is 138 copies/mL. A negative result does not preclude SARS-Cov-2 infection and should not be used as the sole basis for treatment or other patient management decisions. A negative result may occur with  improper specimen collection/handling, submission of specimen other than nasopharyngeal swab, presence of viral mutation(s) within the areas targeted by this assay, and inadequate number of viral copies(<138 copies/mL). A negative result must be combined with clinical observations, patient history, and epidemiological information. The expected result is Negative.  Fact Sheet for Patients:  BloggerCourse.com  Fact Sheet for Healthcare Providers:  SeriousBroker.it  This test is no t yet approved or cleared by the [place] FDA and  has been authorized for detection and/or diagnosis of SARS-CoV-2 by FDA under an Emergency Use Authorization (EUA). This EUA will remain  in effect (meaning this test can be used) for the duration of the COVID-19 declaration under Section 564(b)(1) of the Act,  21 U.S.C.section 360bbb-3(b)(1), unless the authorization is terminated  or revoked sooner.       Influenza A by PCR NEGATIVE NEGATIVE Final   Influenza B by PCR NEGATIVE NEGATIVE Final    Comment: (NOTE) The Xpert Xpress SARS-CoV-2/FLU/RSV plus assay is intended as an aid in the diagnosis of influenza from Nasopharyngeal swab specimens and should not be used as a  sole basis for treatment. Nasal washings and aspirates are unacceptable for Xpert Xpress SARS-CoV-2/FLU/RSV testing.  Fact Sheet for Patients: BloggerCourse.com  Fact Sheet for Healthcare Providers: SeriousBroker.it  This test is not yet approved or cleared by the [place] FDA and has been authorized for detection and/or diagnosis of SARS-CoV-2 by FDA under an Emergency Use Authorization (EUA). This EUA will remain in effect (meaning this test can be used) for the duration of the COVID-19 declaration under Section 564(b)(1) of the Act, 21 U.S.C. section 360bbb-3(b)(1), unless the authorization is terminated or revoked.  Performed at [facility], [address], [place], [place] [address]   Surgical PCR screen     Status: None   Collection Time: 09/04/20 10:23 AM   Specimen: Nasal Mucosa; Nasal Swab  Result Value Ref Range Status   MRSA, PCR NEGATIVE NEGATIVE Final   Staphylococcus aureus NEGATIVE NEGATIVE Final    Comment: (NOTE) The Xpert SA Assay (FDA approved for NASAL specimens in patients 30 years of age and older), is one component of a comprehensive surveillance program. It is not intended to diagnose infection nor to guide or monitor treatment. Performed at [facility], [address] [place], [place] [address]      Labs: Basic Metabolic Panel: Recent Labs  Lab 09/03/20 1856 09/04/20 0436 09/05/20 0257 09/06/20 0358 09/07/20 0729  NA 126* 127* 129* 133* 130*  K 5.6* 3.7 3.2* 5.0 3.4*  CL 95* 99 98 102 101  CO2 17* 18* 22  18* 20*  GLUCOSE 110* 118* 139* 106* 99  BUN CREATININE 0.94 0.79 0.55 0.64 0.51  CALCIUM 8.8* 8.5* 8.2* 8.4* 8.0*  MG  --   --   --   --  1.9   Liver Function Tests: Recent Labs  Lab 09/05/20 0257  AST 23  ALT 16  ALKPHOS 63  BILITOT 1.0  PROT 5.7*  ALBUMIN 3.0*   No results for input(s): LIPASE, AMYLASE in the last 168 hours. No results for input(s): AMMONIA in the last 168 hours. CBC: Recent Labs  Lab 09/03/20 1856 09/04/20 0436 09/05/20 0257 09/07/20 0729  WBC 17.3* 12.1* 14.6* 13.4*  NEUTROABS  --   --   --  10.7*  HGB 15.0 13.6 12.4 11.2*  HCT 43.6 39.3 36.3 33.0*  MCV 86.3 86.9 87.7 87.8  PLT 359 317 291 300   Cardiac Enzymes: No results for input(s): CKTOTAL, CKMB, CKMBINDEX, TROPONINI in the last 168 hours. BNP: BNP (last 3 results) No results for input(s): BNP in the last 8760 hours.  ProBNP (last 3 results) No results for input(s): PROBNP in the last 8760 hours.  CBG: No results for input(s): GLUCAP in the last 168 hours.     Signed:  Albertine Grates MD, PhD, FACP  [facility] 09/07/2020, 10:04 AM

## 2020-09-07 NOTE — Progress Notes (Signed)
Physical Therapy Treatment Patient Details Name: Alexandra Reyes MRN: [id number] DOB: September 29, 1925 Today's Date: 09/07/2020    History of Present Illness Pt is a 87yo female who sustained a fall while raking leaves in her yard and sustained C1/C2/T2 fractures and L humeral head fracture and subluxation, Left REVERSE SHOULDER ARTHROPLASTY 3/29. PMH: COPD, DD, first degree AV block, GERD, hiatal hernia pSH: inguinal hernia repair 2019    PT Comments    Pt was seen for mobility on [facility] with stairs trained by PT with family in attendance, and family asking about platform for LUE.  Told family that PT was unaware of any such plan as chart does not substantiate this, and received confirmation of same from the staff.  Follow along as her stay requires to work on mobility with [facility], which pt preferred to RW.  Pt is also tending to let cane lag behind, prompted her to keep it more ahead of her to walk.  Stairs are a challenge, did 5 steps x 2 with railing and mn guard.  Family has a gait belt for home now.    Follow Up Recommendations  Home health PT;Supervision/Assistance - 24 hour     Equipment Recommendations  Rolling walker with 5" wheels    Recommendations for Other Services       Precautions / Restrictions Precautions Precautions: Fall;Shoulder;Cervical Type of Shoulder Precautions: no ROM shoulder; AROM elbow/wrist/hand Shoulder Interventions: Shoulder sling/immobilizer;Off for dressing/bathing/exercises (may remove if needed to use walker  ) Precaution Booklet Issued: Yes (comment) Precaution Comments: L UE in sling, c-collar Required Braces or Orthoses: Cervical Brace;Sling Cervical Brace: Hard collar;At all times Restrictions Weight Bearing Restrictions: (P) Yes LUE Weight Bearing: (P) Non weight bearing Other Position/Activity Restrictions: OK to remove sling and use LUE with RW if needed    Mobility  Bed Mobility Overal bed mobility: Needs Assistance Bed Mobility: Supine to  Sit;Sit to Supine   Sidelying to sit: Min assist Supine to sit: Min assist Sit to supine: Min assist   General bed mobility comments: varying heights of HOB during mult transitions    Transfers Overall transfer level: Needs assistance Equipment used: 1 person hand held assist;Straight cane Transfers: Sit to/from Stand Sit to Stand: Min guard         General transfer comment: min guard with reminders to use stable surface to push off with RUE  Ambulation/Gait Ambulation/Gait assistance: Min assist;Min guard Gait Distance (Feet): 60 Feet (mult turns and maneuvers in room) Assistive device: 1 person hand held assist;Straight cane Gait Pattern/deviations: Step-through pattern;Decreased stride length;Trunk flexed Gait velocity: appropriate for age Gait velocity interpretation: (P) <1.8 ft/sec, indicate of risk for recurrent falls General Gait Details: avoided RW today, used SPC and min guard to walk and maneuver   Stairs             Wheelchair Mobility    Modified Rankin (Stroke Patients Only)       Balance Overall balance assessment: History of Falls;Needs assistance Sitting-balance support: Feet supported Sitting balance-Leahy Scale: Good     Standing balance support: Single extremity supported Standing balance-Leahy Scale: Poor Standing balance comment: min guard and SPC to maneuver to chair and back to bed                            Cognition Arousal/Alertness: Awake/alert Behavior During Therapy: WFL for tasks assessed/performed Overall Cognitive Status: Within Functional Limits for tasks assessed  General Comments: likely baseline cognition as family is not reacting to her statements      Exercises Other Exercises Other Exercises: encouraged elbow, wrist and hand AROM exercises, family involved in education. Pt with WFL elbow extension.    General Comments General comments (skin integrity,  edema, etc.): (P) LUE in sling, did not look at surgery site      Pertinent Vitals/Pain Pain Assessment: Faces Faces Pain Scale: Hurts little more (.) Pain Location: L shoulder; neck Pain Descriptors / Indicators: Guarding;Grimacing Pain Intervention(s): Limited activity within patient's tolerance;Monitored during session;Repositioned;Premedicated before session (Simultaneous filing. User may not have seen previous data.)    Home Living                      Prior Function            PT Goals (current goals can now be found in the care plan section) Acute Rehab PT Goals Patient Stated Goal: go home PT Goal Formulation: With patient Progress towards PT goals: (P) Progressing toward goals    Frequency    Min 4X/week      PT Plan Current plan remains appropriate    Co-evaluation              AM-PAC PT "6 Clicks" Mobility   Outcome Measure  Help needed turning from your back to your side while in a flat bed without using bedrails?: A Little Help needed moving from lying on your back to sitting on the side of a flat bed without using bedrails?: A Little Help needed moving to and from a bed to a chair (including a wheelchair)?: A Little Help needed standing up from a chair using your arms (e.g., wheelchair or bedside chair)?: A Little Help needed to walk in hospital room?: A Little Help needed climbing 3-5 steps with a railing? : A Little 6 Click Score: 18    End of Session Equipment Utilized During Treatment: Gait belt;Cervical collar;Other (comment) (LUE sling) Activity Tolerance: Patient tolerated treatment well Patient left: in bed;with call bell/phone within reach;with bed alarm set;with family/visitor present Nurse Communication: Mobility status;Other (comment) (replace purwick) PT Visit Diagnosis: Unsteadiness on feet (R26.81);Difficulty in walking, not elsewhere classified (R26.2);Pain Pain - Right/Left: Left Pain - part of body: Shoulder      Time: (P) 1131-(P) 1212 PT Time Calculation (min) (ACUTE ONLY): (P) 41 min  Charges:  $Gait Training: (P) 23-37 mins $Therapeutic Activity: (P) 8-22 mins                   Ivar Drape 09/07/2020, 1:50 PM  Samul Dada, PT MS Acute Rehab Dept. Number: [facility] [id number] and [facility] [phone]

## 2020-09-07 NOTE — Progress Notes (Signed)
Subjective: Alexandra Reyes is a 87 year old female 2 days s/p right reverse total shoulder arthroplasty.   Patient reports pain as mild to moderate.  Patient reports overall she is doing well.  She is worked with physical therapy and did some walking.  She denies numbness or tingling today.  Denies fever or chills.  She is stating her sling except for elbow, wrist, and digit range of motion exercises.  Objective:   VITALS:   Vitals:   09/06/20 1503 09/06/20 2030 09/07/20 0507 09/07/20 0835  BP: 120/67 132/66 (!) 146/85 125/79  Pulse: 80 76 98 (!) 102  Resp: 18 17 16 18   Temp: 98.1 F (36.7 C) 98 F (36.7 C) 97.8 F (36.6 C) 98.2 F (36.8 C)  TempSrc: Oral Oral Oral Oral  SpO2: 97% 98%  96%  Weight:      Height:         Left Upper Extremity:   INSPECTION & PALPATION: No gross deformity of the shoulder.  Resolving ecchymosis of the upper arm and forearm.  Aquacel dressing present without discharge.  Dressing is clean dry and intact.  SENSORY: sensation is intact to light touch in:  superficial radial nerve distribution (dorsal first web space) median nerve distribution (tip of index finger)   ulnar nerve distribution (tip of small finger)    Axillary nerve distribution (lateral shoulder)   ROM: painless passive range of motion of the elbow and wrist.  Elbow range of motion 5-1 25   MOTOR:  + motor posterior interosseous nerve (thumb IP extension) + anterior interosseous nerve (thumb IP flexion, index finger DIP flexion) + radial nerve (wrist extension) + median nerve (palpable firing thenar mass) + ulnar nerve (palpable firing of first dorsal interosseous muscle)    VASCULAR: 2+ radial pulse, brisk capillary refill < 2 sec, fingers warm and well-perfused     Lab Results  Component Value Date   WBC 13.4 (H) 09/07/2020   HGB 11.2 (L) 09/07/2020   HCT 33.0 (L) 09/07/2020   MCV 87.8 09/07/2020   PLT 300 09/07/2020   BMET    Component Value Date/Time   NA 130 (L)  09/07/2020 0729   NA 136 07/17/2016 1029   K 3.4 (L) 09/07/2020 0729   CL 101 09/07/2020 0729   CO2 20 (L) 09/07/2020 0729   GLUCOSE 99 09/07/2020 0729   BUN 8 09/07/2020 0729   BUN 14 07/17/2016 1029   CREATININE 0.51 09/07/2020 0729   CREATININE 1.11 (H) 02/14/2016 1234   CALCIUM 8.0 (L) 09/07/2020 0729   GFRNONAA >60 09/07/2020 0729   GFRAA 58 (L) 06/25/2017 1034     Assessment/Plan: 2 Days Post-Op   Principal Problem:   Cervical spine fracture (HCC) Active Problems:   Paroxysmal SVT (supraventricular tachycardia) (HCC)   ILD (interstitial lung disease) (HCC)   C1 cervical fracture (HCC)   Advance diet Up with therapy Discharge per primary  Weightbearing Status: NWB in a silng in general except may use the LUE for rolling walker and ADLs. Patient will remove the sling for hygiene, ADLs, and to work on elbow, wrist, and digit range of motion.  Maintain dressing until first post op visit, dressing is water proof - okay to shower.   If you she removes the sling while at rest recommend a pillow behind the shoulder for support.  Follow-up with Dr. 06/27/2017 or Aundria Rud PA-C in 2 weeks for xrays and wound check.    Alexandra Reyes 09/07/2020, 12:42 PM  Alexandra Reyes  [facility] PA-C  Physician Assistant with Dr. Rebekah [place]

## 2020-09-07 NOTE — Progress Notes (Signed)
Occupational Therapy Treatment Patient Details Name: Alexandra Reyes MRN: [id number] DOB: [birth year] Today's Date: 09/07/2020    History of present illness Pt is a 87yo female who sustained a fall while raking leaves in her yard and sustained C1/C2/T2 fractures and L humeral head fracture and subluxation, Left REVERSE SHOULDER ARTHROPLASTY 3/29. PMH: COPD, DD, first degree AV block, GERD, hiatal hernia pSH: inguinal hernia repair 2019   OT comments  Pt seen for OT education regarding shoulder and cervical precautions, compensatory strategies with ADLs, cervical collar and sling management, etc. Pt's son and daughter, who will be assisting at d/c, present and involved in session. Cueing needed at times for maintaining precautions. D/c plan remains appropriate.     Follow Up Recommendations  Home health OT;Supervision/Assistance - 24 hour    Equipment Recommendations  Tub/shower seat    Recommendations for Other Services      Precautions / Restrictions Precautions Precautions: Fall;Shoulder;Cervical Type of Shoulder Precautions: no ROM shoulder; AROM elbow/wrist/hand Shoulder Interventions: Shoulder sling/immobilizer;Off for dressing/bathing/exercises Precaution Booklet Issued: Yes (comment) Precaution Comments: L UE in sling, c-collar Required Braces or Orthoses: Cervical Brace;Sling Cervical Brace: Hard collar;At all times Restrictions Weight Bearing Restrictions: Yes LUE Weight Bearing: Non weight bearing Other Position/Activity Restrictions: OK to remove sling and use LUE with RW if needed       Mobility Bed Mobility               General bed mobility comments: up in recliner    Transfers Overall transfer level: Needs assistance Equipment used: 1 person hand held assist Transfers: Sit to/from Stand Sit to Stand: Min assist         General transfer comment: minA to power up and steady. cane in R hand utilized.    Balance Overall balance assessment: History  of Falls;Needs assistance Sitting-balance support: Feet supported;Single extremity supported Sitting balance-Leahy Scale: Good     Standing balance support: Single extremity supported;During functional activity Standing balance-Leahy Scale: Poor Standing balance comment: requires at least single UE support. cues for technique.                           ADL either performed or assessed with clinical judgement   ADL Overall ADL's : Needs assistance/impaired                                       General ADL Comments: Pt's son and daughter present for education on cervical and L shoulder precautions especially as related to basic ADLs. Handouts provided. Discussed techniques for bathing/dressing, oral care, positioning, sling management, cervical collar management (if needed for cleanliness). reinforced precautions and sling and cervical collar wearing schedules     Vision   Additional Comments: Macular degeneration   Perception     Praxis      Cognition Arousal/Alertness: Awake/alert Behavior During Therapy: WFL for tasks assessed/performed                                   General Comments: decreased insight to safety and deficits, and impaired memory however suspect this to be her baseline        Exercises Exercises: Other exercises Other Exercises Other Exercises: encouraged elbow, wrist and hand AROM exercises, family involved in education. Pt with WFL elbow extension.   Shoulder  Instructions       General Comments      Pertinent Vitals/ Pain       Pain Assessment: Faces Faces Pain Scale: Hurts a little bit Pain Location: L shoulder; neck Pain Descriptors / Indicators: Discomfort Pain Intervention(s): Monitored during session  Home Living                                          Prior Functioning/Environment              Frequency  Min 3X/week        Progress Toward Goals  OT  Goals(current goals can now be found in the care plan section)  Progress towards OT goals: Progressing toward goals  Acute Rehab OT Goals Patient Stated Goal: go home OT Goal Formulation: With patient Time For Goal Achievement: 09/20/20 Potential to Achieve Goals: Good ADL Goals Pt Will Perform Upper Body Bathing: with caregiver independent in assisting;with supervision;sitting Pt Will Perform Lower Body Bathing: with caregiver independent in assisting;with supervision;sit to/from stand Pt Will Perform Upper Body Dressing: with supervision;with set-up;sitting;with caregiver independent in assisting Pt Will Perform Lower Body Dressing: with supervision;with caregiver independent in assisting;sit to/from stand Pt Will Perform Toileting - Clothing Manipulation and hygiene: with modified independence Pt/caregiver will Perform Home Exercise Program: Left upper extremity;With Supervision;With written HEP provided;Increased ROM Additional ADL Goal #1: Pt/family will independently verbalize understandng of managmenet of LUE regarding sling use and positioning  Plan Discharge plan remains appropriate    Co-evaluation                 AM-PAC OT "6 Clicks" Daily Activity     Outcome Measure   Help from another person eating meals?: A Little Help from another person taking care of personal grooming?: A Little Help from another person toileting, which includes using toliet, bedpan, or urinal?: A Lot Help from another person bathing (including washing, rinsing, drying)?: A Lot Help from another person to put on and taking off regular upper body clothing?: A Lot Help from another person to put on and taking off regular lower body clothing?: A Lot 6 Click Score: 14    End of Session Equipment Utilized During Treatment: Cervical collar;Other (comment);Gait belt (L UE sling, SPC)  OT Visit Diagnosis: Unsteadiness on feet (R26.81);Muscle weakness (generalized) (M62.81);History of falling  (Z91.81);Pain Pain - Right/Left: Left Pain - part of body: Shoulder (neck)   Activity Tolerance Patient tolerated treatment well   Patient Left Other (comment);with family/visitor present (working with PT)   Nurse Communication          Time: 8182-9937 OT Time Calculation (min): 26 min  Charges: OT General Charges $OT Visit: 1 Visit OT Treatments $Self Care/Home Management : 23-37 mins  Raynald Kemp, OT Acute Rehabilitation Services Pager: [phone] Office: [phone]    Pilar Grammes 09/07/2020, 1:40 PM

## 2020-09-07 NOTE — TOC Transition Note (Signed)
Transition of Care [facility]) - CM/SW Discharge Note   Patient Details  Name: Alexandra Reyes MRN: [id number] Date of Birth: 1925-10-25  Transition of Care [facility]) CM/SW Contact:  Epifanio Lesches, RN Phone Number: 09/07/2020, 11:42 AM   Clinical Narrative:    Patient will DC QB:HALP  Anticipated DC date: 09/07/2020 Family notified: yes Transport by: car        - reverse shoulder arthroplasty 3/29  Per MD patient ready for DC today . RN, patient, and patient's family, notified of DC.  Acceptance received from [facility] for home health services. DME delivered to bedside. Pt with transportation to home . Pt without Rx med concerns.  Post hospital f/u scheduled and noted on AVS.  RNCM will sign off for now as intervention is no longer needed. Please consult [place] again if new needs arise.   Final next level of care: Home w Home Health Services Barriers to Discharge: No Barriers Identified   Patient Goals and CMS Choice     Choice offered to / list presented to : Patient  Discharge Placement                       Discharge Plan and Services   Discharge Planning Services: CM Consult            DME Arranged: Dan Humphreys rolling (shower chair) DME Agency: [employer] Date DME Agency Contacted: 09/06/20 Time DME Agency Contacted: [phone] Representative spoke with at DME Agency: Velna Hatchet HH Arranged: PT,OT,Nurse's Aide HH Agency: [facility] Date HH Agency Contacted: 09/07/20 Time HH Agency Contacted: [phone] Representative spoke with at [facility] Agency: [place]  Social Determinants of Health (SDOH) Interventions     Readmission Risk Interventions No flowsheet data found.

## 2020-09-13 ENCOUNTER — Other Ambulatory Visit: Payer: Self-pay

## 2020-09-13 ENCOUNTER — Encounter: Payer: Self-pay | Admitting: Internal Medicine

## 2020-09-13 ENCOUNTER — Ambulatory Visit (INDEPENDENT_AMBULATORY_CARE_PROVIDER_SITE_OTHER): Payer: Medicare Other | Admitting: Internal Medicine

## 2020-09-13 VITALS — BP 124/70 | HR 83

## 2020-09-13 DIAGNOSIS — S12001D Unspecified nondisplaced fracture of first cervical vertebra, subsequent encounter for fracture with routine healing: Secondary | ICD-10-CM | POA: Diagnosis not present

## 2020-09-13 DIAGNOSIS — R739 Hyperglycemia, unspecified: Secondary | ICD-10-CM | POA: Diagnosis not present

## 2020-09-13 DIAGNOSIS — S42212D Unspecified displaced fracture of surgical neck of left humerus, subsequent encounter for fracture with routine healing: Secondary | ICD-10-CM

## 2020-09-13 DIAGNOSIS — E871 Hypo-osmolality and hyponatremia: Secondary | ICD-10-CM

## 2020-09-13 DIAGNOSIS — I471 Supraventricular tachycardia: Secondary | ICD-10-CM

## 2020-09-13 DIAGNOSIS — I10 Essential (primary) hypertension: Secondary | ICD-10-CM

## 2020-09-13 LAB — CBC WITH DIFFERENTIAL/PLATELET
Basophils Absolute: 0.1 10*3/uL (ref 0.0–0.1)
Basophils Relative: 0.9 % (ref 0.0–3.0)
Eosinophils Absolute: 0.1 10*3/uL (ref 0.0–0.7)
Eosinophils Relative: 0.9 % (ref 0.0–5.0)
HCT: 32.3 % — ABNORMAL LOW (ref 36.0–46.0)
Hemoglobin: 10.8 g/dL — ABNORMAL LOW (ref 12.0–15.0)
Lymphocytes Relative: 12.1 % (ref 12.0–46.0)
Lymphs Abs: 1.4 10*3/uL (ref 0.7–4.0)
MCHC: 33.5 g/dL (ref 30.0–36.0)
MCV: 87.5 fl (ref 78.0–100.0)
Monocytes Absolute: 1.4 10*3/uL — ABNORMAL HIGH (ref 0.1–1.0)
Monocytes Relative: 11.8 % (ref 3.0–12.0)
Neutro Abs: 8.9 10*3/uL — ABNORMAL HIGH (ref 1.4–7.7)
Neutrophils Relative %: 74.3 % (ref 43.0–77.0)
Platelets: 704 10*3/uL — ABNORMAL HIGH (ref 150.0–400.0)
RBC: 3.69 Mil/uL — ABNORMAL LOW (ref 3.87–5.11)
RDW: 14.4 % (ref 11.5–15.5)
WBC: 12 10*3/uL — ABNORMAL HIGH (ref 4.0–10.5)

## 2020-09-13 LAB — HEPATIC FUNCTION PANEL
ALT: 21 U/L (ref 0–35)
AST: 21 U/L (ref 0–37)
Albumin: 3.3 g/dL — ABNORMAL LOW (ref 3.5–5.2)
Alkaline Phosphatase: 102 U/L (ref 39–117)
Bilirubin, Direct: 0.2 mg/dL (ref 0.0–0.3)
Total Bilirubin: 1 mg/dL (ref 0.2–1.2)
Total Protein: 5.5 g/dL — ABNORMAL LOW (ref 6.0–8.3)

## 2020-09-13 LAB — BASIC METABOLIC PANEL
BUN: 16 mg/dL (ref 6–23)
CO2: 25 mEq/L (ref 19–32)
Calcium: 8.4 mg/dL (ref 8.4–10.5)
Chloride: 97 mEq/L (ref 96–112)
Creatinine, Ser: 0.8 mg/dL (ref 0.40–1.20)
GFR: 63.91 mL/min (ref >60.00)
Glucose, Bld: 98 mg/dL (ref 70–99)
Potassium: 4 mEq/L (ref 3.5–5.1)
Sodium: 132 mEq/L — ABNORMAL LOW (ref 135–145)

## 2020-09-13 NOTE — Patient Instructions (Addendum)
Please continue all other medications as before, except no more oxycodone  Please have the pharmacy call with any other refills you may need.  Please continue your efforts at being more active, low cholesterol diet, and weight control.  You are otherwise up to date with prevention measures today.  Please keep your appointments with your specialists as you may have planned

## 2020-09-13 NOTE — Progress Notes (Signed)
Patient ID: Alexandra Reyes, female   DOB: [birth year], 87 y.o.   MRN: [id number]        Chief Complaint: follow up post hospn       HPI:  Alexandra Reyes is a 87 y.o. female here post hospn mar 27-31 after fall with c1 cervical fx as well as  x-rays revealed left humeral head fracture with subluxation. Did also have Na 126.  For C1 anterior arch fracture,type II odontoid fracture - -Evaluated by neurosurgery,recommended cervical collar,follow-up with neurosurgery in 2 weeks.  For Left humeral head comminuted fracture with subluxation - pt is s/pLeftREVERSE SHOULDER ARTHROPLASTYon 3/29 and follow ortho recommendations:  "--Sling at all times other than for activities of daily living or exercise of the hand, wrist, and elbow. She also may use the left upper extremity for rolling walker when needed. -She may remove the sling to rest her neck and allow elbow extension while lying in the bed with a pillow behind the shoulder and humerus. -Maintain postoperative bandage until follow-up. She may shower with this in place. Due for f/u there as well at 2 wks and has appt.  Na improved to 130 at d/c with IVF.       Pt here with daughter who can only remain here in town for about 1 more wk, overall pt doing well without further specific ortho complaints. Left arm in sling. Surgical site without signficant redness or swelling  Pt denies chest pain, increased sob or doe, wheezing, orthopnea, PND, increased LE swelling, palpitations, dizziness or syncope.  Denies other new focal neuro s/s.  Pt denies polydipsia, polyuria,  Pt denies fever, wt loss, night sweats, loss of appetite, or other constitutional symptoms   Wt Readings from Last 3 Encounters:  09/03/20 124 lb (56.2 kg)  08/23/20 124 lb 8 oz (56.5 kg)  07/05/20 123 lb (55.8 kg)   BP Readings from Last 3 Encounters:  09/13/20 124/70  09/07/20 125/79  08/23/20 124/62   Past Medical History:  Diagnosis Date  . Age-related macular  degeneration, wet, both eyes (HCC)   . Alcohol dependence in remission (HCC) 05/22/2011  . Allergic rhinitis, cause unspecified 05/22/2011  . Allergy   . Anemia, iron deficiency 05/18/2011  . Cholelithiasis   . COPD (chronic obstructive pulmonary disease) (HCC)   . DDD (degenerative disc disease), lumbar 05/18/2011  . Depression    "@ times" (09/19/2016)  . First degree AV block   . GERD (gastroesophageal reflux disease)   . Hiatal hernia   . History of blood transfusion    "3 or 4 related to childbirth"  . HTN (hypertension) 05/18/2011   pt denies this hx on 09/19/2016  . Hyperlipidemia 05/18/2011  . Macular degeneration   . Myocardial infarction (HCC)    "EKG shows I've had 2; date unknown" (09/19/2016)  . OA (osteoarthritis)    "a little here and there; mainly in the back" (09/19/2016)  . Osteoporosis 05/18/2011  . PAT (paroxysmal atrial tachycardia) (HCC)   . Pectus excavatum 05/18/2011  . Personal history of colonic polyps 10/28/2012  . Pneumonia    "as a child"  . SVT (supraventricular tachycardia) (HCC)   . Urinary frequency    Past Surgical History:  Procedure Laterality Date  . ABDOMINAL HYSTERECTOMY  1988  . BREAST BIOPSY Right 1948   "it was ok"  . CARDIAC CATHETERIZATION    . CARDIOVASCULAR STRESS TEST  03/08/2009   EF 84%  . CATARACT EXTRACTION W/ INTRAOCULAR LENS  IMPLANT, BILATERAL  Bilateral   . INGUINAL HERNIA REPAIR Right 06/23/2017   Procedure: REPAIR INCARCERATED  RIGHT FEMORAL HERNIA WITH MESH;  Surgeon: Harriette Bouillon, MD;  Location: [facility] OR;  Service: General;  Laterality: Right;  . KNEE ARTHROSCOPY Right   . REVERSE SHOULDER ARTHROPLASTY Left 09/05/2020   Procedure: REVERSE SHOULDER ARTHROPLASTY;  Surgeon: Yolonda Kida, MD;  Location: [facility] OR;  Service: Orthopedics;  Laterality: Left;  . RIGHT/LEFT HEART CATH AND CORONARY ANGIOGRAPHY N/A 07/25/2016   Procedure: Right/Left Heart Cath and Coronary Angiography;  Surgeon: Kathleene Hazel, MD;  Location:  [facility] INVASIVE CV LAB;  Service: Cardiovascular;  Laterality: N/A;  . SVT ABLATION  09/19/2016  . SVT ABLATION N/A 09/19/2016   Procedure: SVT Ablation;  Surgeon: Hillis Range, MD;  Location: [facility] INVASIVE CV LAB;  Service: Cardiovascular;  Laterality: N/A;  . TONSILLECTOMY AND ADENOIDECTOMY    . US ECHOCARDIOGRAPHY  01/17/2003   EF 55-60%    reports that she quit smoking about 26 years ago. Her smoking use included cigarettes. She started smoking about 61 years ago. She has a 54.00 pack-year smoking history. She has never used smokeless tobacco. She reports that she does not drink alcohol and does not use drugs. family history includes Arthritis in her mother; Heart disease in her father; Heart failure in her mother; Hypertension in her father; Kidney failure in her father. Allergies  Allergen Reactions  . Oxycodone Other (See Comments)    hallucinations   Current Outpatient Medications on File Prior to Visit  Medication Sig Dispense Refill  . acetaminophen (TYLENOL) 500 MG tablet Take 1,000 mg by mouth 2 (two) times daily as needed for moderate pain or headache.    . ALPRAZolam (XANAX) 0.25 MG tablet TAKE ONE TABLET BY MOUTH EVERY NIGHT AT BEDTIME AS NEEDED FOR ANXIETY (Patient taking differently: Take 0.125-0.25 mg by mouth at bedtime as needed for anxiety or sleep.) 90 tablet 1  . amLODipine (NORVASC) 5 MG tablet TAKE ONE TABLET BY MOUTH DAILY (Patient taking differently: Take 5 mg by mouth daily.) 90 tablet 1  . Ascorbic Acid (VITAMIN C PO) Take 500 mg by mouth daily. Reported on 08/08/2015    . aspirin 81 MG tablet Take 81 mg by mouth at bedtime.    . B Complex Vitamins (VITAMIN B COMPLEX PO) Take 1 tablet by mouth daily.    . calcium carbonate (OS-CAL) 600 MG tablet Take 600 mg by mouth daily.    Marland Kitchen CARTIA XT 120 MG 24 hr capsule TAKE ONE CAPSULE BY MOUTH DAILY (Patient taking differently: Take 120 mg by mouth daily.) 90 capsule 3  . diltiazem (CARDIZEM) 30 MG tablet Take 1 tablet (30 mg total)  by mouth 4 (four) times daily as needed. (Patient taking differently: Take 30 mg by mouth 4 (four) times daily as needed (Tachycardia).) 120 tablet 11  . furosemide (LASIX) 40 MG tablet TAKE ONE TABLET BY MOUTH DAILY (Patient taking differently: Take 40 mg by mouth daily.) 90 tablet 3  . gabapentin (NEURONTIN) 100 MG capsule TAKE ONE CAPSULE BY MOUTH THREE TIMES A DAY (Patient taking differently: Take 100 mg by mouth 3 (three) times daily.) 90 capsule 10  . Glucosamine HCl-MSM (GLUCOSAMINE-MSM PO) Take 2 tablets by mouth daily.     . nitroGLYCERIN (NITROSTAT) 0.4 MG SL tablet Place 1 tablet (0.4 mg total) under the tongue every 5 (five) minutes as needed for chest pain. 25 tablet 3  . omeprazole (PRILOSEC) 40 MG capsule TAKE ONE CAPSULE BY MOUTH DAILY (Patient  taking differently: Take 40 mg by mouth daily.) 90 capsule 1  . Respiratory Therapy Supplies (FLUTTER) DEVI Use as directed 1 each 0  . simvastatin (ZOCOR) 10 MG tablet Take 1 tablet (10 mg total) by mouth daily. (Patient taking differently: Take 10 mg by mouth daily at 6 PM.) 90 tablet 3  . traMADol (ULTRAM) 50 MG tablet TAKE ONE TABLET BY MOUTH TWICE A DAY AS NEEDED (Patient taking differently: Take 50 mg by mouth every 12 (twelve) hours as needed for moderate pain.) 60 tablet 2  . triamcinolone cream (KENALOG) 0.1 % Apply 1 application topically daily as needed (hives). Apply to affected area 30 g 0  . vitamin E 200 UNIT capsule Take 200 Units by mouth daily.    Marland Kitchen. oxyCODONE (ROXICODONE) 5 MG immediate release tablet Take 1 tablet (5 mg total) by mouth every 8 (eight) hours as needed. (Patient not taking: Reported on 09/13/2020) 20 tablet 0   No current facility-administered medications on file prior to visit.        ROS:  All others reviewed and negative.  Objective        PE:  BP 124/70 (BP Location: Right Arm, Patient Position: Sitting, Cuff Size: Normal)   Pulse 83   LMP  (LMP Unknown)   SpO2 96%                 Constitutional: Pt  appears in NAD               HENT: Head: NCAT.                Right Ear: External ear normal.                 Left Ear: External ear normal.                Eyes: . Pupils are equal, round, and reactive to light. Conjunctivae and EOM are normal               Nose: without d/c or deformity               Neck: Neck supple. Gross normal ROM               Cardiovascular: Normal rate and regular rhythm.                 Pulmonary/Chest: Effort normal and breath sounds without rales or wheezing.                Abd:  Soft, NT, ND, + BS, no organomegaly               Neurological: Pt is alert. At baseline orientation, motor grossly intact               Skin: Skin is warm. No rashes, no other new lesions, LE edema - none               Psychiatric: Pt behavior is normal without agitation   Micro: none  Cardiac tracings I have personally interpreted today:  none  Pertinent Radiological findings (summarize): none   Lab Results  Component Value Date   WBC 12.0 (H) 09/13/2020   HGB 10.8 (L) 09/13/2020   HCT 32.3 (L) 09/13/2020   PLT 704.0 (H) 09/13/2020   GLUCOSE 98 09/13/2020   CHOL 178 07/05/2020   TRIG 108.0 07/05/2020   HDL 75.60 07/05/2020   LDLDIRECT 129.2 05/15/2011   LDLCALC 81 07/05/2020   ALT  21 09/13/2020   AST 21 09/13/2020   NA 132 (L) 09/13/2020   K 4.0 09/13/2020   CL 97 09/13/2020   CREATININE 0.80 09/13/2020   BUN 16 09/13/2020   CO2 25 09/13/2020   TSH 1.67 07/05/2020   INR 1.0 07/17/2016   HGBA1C 5.8 07/05/2020   Assessment/Plan:  Alexandra Reyes is a 87 y.o. White or Caucasian [1] female with  has a past medical history of Age-related macular degeneration, wet, both eyes (HCC), Alcohol dependence in remission (HCC) (05/22/2011), Allergic rhinitis, cause unspecified (05/22/2011), Allergy, Anemia, iron deficiency (05/18/2011), Cholelithiasis, COPD (chronic obstructive pulmonary disease) (HCC), DDD (degenerative disc disease), lumbar (05/18/2011), Depression, First degree  AV block, GERD (gastroesophageal reflux disease), Hiatal hernia, History of blood transfusion, HTN (hypertension) (05/18/2011), Hyperlipidemia (05/18/2011), Macular degeneration, Myocardial infarction (HCC), OA (osteoarthritis), Osteoporosis (05/18/2011), PAT (paroxysmal atrial tachycardia) (HCC), Pectus excavatum (05/18/2011), Personal history of colonic polyps (10/28/2012), Pneumonia, SVT (supraventricular tachycardia) (HCC), and Urinary frequency.  Hyperglycemia Lab Results  Component Value Date   HGBA1C 5.8 07/05/2020   Stable, pt to continue current medical treatment  - diet   HTN (hypertension) BP Readings from Last 3 Encounters:  09/13/20 124/70  09/07/20 125/79  08/23/20 124/62   Stable, pt to continue medical treatment - cardizem, lasix   Paroxysmal SVT (supraventricular tachycardia) (HCC) Stable on cardizem  Hyponatremia Declines further f/u lab today  C1 cervical fracture (HCC) To f/u also NS as planned  Closed displaced fracture of surgical neck of left humerus Stable, for f/u ortho as planned  Followup: Return in about 4 months (around 01/02/2021).  Oliver Barre, MD 09/18/2020 2:30 AM [facility] Internal Medicine

## 2020-09-17 ENCOUNTER — Other Ambulatory Visit: Payer: Self-pay | Admitting: *Deleted

## 2020-09-17 MED ORDER — BUDESONIDE-FORMOTEROL FUMARATE 80-4.5 MCG/ACT IN AERO: INHALATION_SPRAY | RESPIRATORY_TRACT | 5 refills | Status: DC

## 2020-09-18 ENCOUNTER — Encounter: Payer: Self-pay | Admitting: Internal Medicine

## 2020-09-18 DIAGNOSIS — Z4789 Encounter for other orthopedic aftercare: Secondary | ICD-10-CM | POA: Insufficient documentation

## 2020-09-18 DIAGNOSIS — M542 Cervicalgia: Secondary | ICD-10-CM | POA: Insufficient documentation

## 2020-09-18 NOTE — Assessment & Plan Note (Signed)
Declines further f/u lab today

## 2020-09-18 NOTE — Assessment & Plan Note (Signed)
Lab Results  °Component Value Date  ° HGBA1C 5.8 07/05/2020  ° °Stable, pt to continue current medical treatment  - diet °

## 2020-09-18 NOTE — Assessment & Plan Note (Signed)
>>  ASSESSMENT AND PLAN FOR HTN (HYPERTENSION) WRITTEN ON 09/18/2020  2:27 AM BY Corwin Levins, MD  BP Readings from Last 3 Encounters:  09/13/20 124/70  09/07/20 125/79  08/23/20 124/62   Stable, pt to continue medical treatment - cardizem, lasix

## 2020-09-18 NOTE — Assessment & Plan Note (Signed)
Stable, for f/u ortho as planned 

## 2020-09-18 NOTE — Assessment & Plan Note (Signed)
BP Readings from Last 3 Encounters:  09/13/20 124/70  09/07/20 125/79  08/23/20 124/62   Stable, pt to continue medical treatment - cardizem, lasix

## 2020-09-18 NOTE — Assessment & Plan Note (Signed)
Stable on cardizem

## 2020-09-18 NOTE — Assessment & Plan Note (Signed)
To f/u also NS as planned

## 2020-09-19 DIAGNOSIS — Z4789 Encounter for other orthopedic aftercare: Secondary | ICD-10-CM | POA: Diagnosis not present

## 2020-09-20 DIAGNOSIS — S22029A Unspecified fracture of second thoracic vertebra, initial encounter for closed fracture: Secondary | ICD-10-CM | POA: Insufficient documentation

## 2020-10-19 DIAGNOSIS — Z4789 Encounter for other orthopedic aftercare: Secondary | ICD-10-CM | POA: Diagnosis not present

## 2020-10-23 ENCOUNTER — Other Ambulatory Visit: Payer: Self-pay | Admitting: Internal Medicine

## 2020-10-30 ENCOUNTER — Other Ambulatory Visit: Payer: Self-pay | Admitting: Internal Medicine

## 2020-10-30 DIAGNOSIS — S12101D Unspecified nondisplaced fracture of second cervical vertebra, subsequent encounter for fracture with routine healing: Secondary | ICD-10-CM | POA: Diagnosis not present

## 2020-10-30 DIAGNOSIS — S22029D Unspecified fracture of second thoracic vertebra, subsequent encounter for fracture with routine healing: Secondary | ICD-10-CM | POA: Diagnosis not present

## 2020-10-30 DIAGNOSIS — S12001D Unspecified nondisplaced fracture of first cervical vertebra, subsequent encounter for fracture with routine healing: Secondary | ICD-10-CM | POA: Insufficient documentation

## 2020-11-08 ENCOUNTER — Telehealth: Payer: Self-pay | Admitting: Internal Medicine

## 2020-11-08 DIAGNOSIS — H353233 Exudative age-related macular degeneration, bilateral, with inactive scar: Secondary | ICD-10-CM | POA: Diagnosis not present

## 2020-11-08 NOTE — Telephone Encounter (Signed)
Patient called and was wondering if Dr. Jonny Ruiz could call her in an Iron supplement. She said that she is concerned about her hemoglobin that was drawn in April. She said that she is having a hard time eating. She said that she stays nauseated most of the time. She said that it can be sent to [facility] [address], [place] - [address] Please advise

## 2020-11-09 NOTE — Telephone Encounter (Signed)
Patient called again. Please advise

## 2020-11-09 NOTE — Telephone Encounter (Signed)
No, iron of course can make some GI symptoms worse and ideally she would need a blood test to prove she had low iron to get the prescriptions  Of note, she has had iron checked 3 time in the past 9 years and has been normal 3 times.  We could see her back at an OV and consider checking the iron again, and anything else needed as well. thanks

## 2020-11-10 NOTE — Telephone Encounter (Signed)
Patient scheduled for Monday 11/13/20

## 2020-11-13 ENCOUNTER — Ambulatory Visit: Payer: Medicare Other | Admitting: Internal Medicine

## 2020-11-13 ENCOUNTER — Other Ambulatory Visit: Payer: Self-pay

## 2020-11-14 ENCOUNTER — Encounter: Payer: Self-pay | Admitting: Internal Medicine

## 2020-11-14 ENCOUNTER — Ambulatory Visit (INDEPENDENT_AMBULATORY_CARE_PROVIDER_SITE_OTHER): Payer: Medicare Other | Admitting: Internal Medicine

## 2020-11-14 VITALS — BP 132/76 | HR 82 | Ht 63.0 in | Wt 113.0 lb

## 2020-11-14 DIAGNOSIS — J449 Chronic obstructive pulmonary disease, unspecified: Secondary | ICD-10-CM | POA: Insufficient documentation

## 2020-11-14 DIAGNOSIS — K2970 Gastritis, unspecified, without bleeding: Secondary | ICD-10-CM | POA: Diagnosis not present

## 2020-11-14 DIAGNOSIS — I2510 Atherosclerotic heart disease of native coronary artery without angina pectoris: Secondary | ICD-10-CM

## 2020-11-14 DIAGNOSIS — K219 Gastro-esophageal reflux disease without esophagitis: Secondary | ICD-10-CM

## 2020-11-14 DIAGNOSIS — K8020 Calculus of gallbladder without cholecystitis without obstruction: Secondary | ICD-10-CM | POA: Insufficient documentation

## 2020-11-14 DIAGNOSIS — M1990 Unspecified osteoarthritis, unspecified site: Secondary | ICD-10-CM | POA: Insufficient documentation

## 2020-11-14 DIAGNOSIS — S42213A Unspecified displaced fracture of surgical neck of unspecified humerus, initial encounter for closed fracture: Secondary | ICD-10-CM | POA: Insufficient documentation

## 2020-11-14 DIAGNOSIS — R110 Nausea: Secondary | ICD-10-CM

## 2020-11-14 DIAGNOSIS — S12100A Unspecified displaced fracture of second cervical vertebra, initial encounter for closed fracture: Secondary | ICD-10-CM | POA: Insufficient documentation

## 2020-11-14 DIAGNOSIS — I10 Essential (primary) hypertension: Secondary | ICD-10-CM

## 2020-11-14 DIAGNOSIS — R634 Abnormal weight loss: Secondary | ICD-10-CM | POA: Diagnosis not present

## 2020-11-14 LAB — CBC WITH DIFFERENTIAL/PLATELET
Basophils Absolute: 0.1 10*3/uL (ref 0.0–0.1)
Basophils Relative: 0.7 % (ref 0.0–3.0)
Eosinophils Absolute: 0.1 10*3/uL (ref 0.0–0.7)
Eosinophils Relative: 0.7 % (ref 0.0–5.0)
HCT: 44.9 % (ref 36.0–46.0)
Hemoglobin: 14.6 g/dL (ref 12.0–15.0)
Lymphocytes Relative: 8.9 % — ABNORMAL LOW (ref 12.0–46.0)
Lymphs Abs: 0.7 10*3/uL (ref 0.7–4.0)
MCHC: 32.5 g/dL (ref 30.0–36.0)
MCV: 86.3 fl (ref 78.0–100.0)
Monocytes Absolute: 0.8 10*3/uL (ref 0.1–1.0)
Monocytes Relative: 9.5 % (ref 3.0–12.0)
Neutro Abs: 6.7 10*3/uL (ref 1.4–7.7)
Neutrophils Relative %: 80.2 % — ABNORMAL HIGH (ref 43.0–77.0)
Platelets: 423 10*3/uL — ABNORMAL HIGH (ref 150.0–400.0)
RBC: 5.2 Mil/uL — ABNORMAL HIGH (ref 3.87–5.11)
RDW: 15.9 % — ABNORMAL HIGH (ref 11.5–15.5)
WBC: 8.3 10*3/uL (ref 4.0–10.5)

## 2020-11-14 MED ORDER — ONDANSETRON HCL 4 MG PO TABS: 4.0000 mg | ORAL_TABLET | Freq: Three times a day (TID) | ORAL | 1 refills | Status: DC | PRN

## 2020-11-14 MED ORDER — PANTOPRAZOLE SODIUM 40 MG PO TBEC: 40.0000 mg | DELAYED_RELEASE_TABLET | Freq: Two times a day (BID) | ORAL | 5 refills | Status: DC

## 2020-11-14 NOTE — Assessment & Plan Note (Signed)
>>  ASSESSMENT AND PLAN FOR HTN (HYPERTENSION) WRITTEN ON 11/14/2020  8:18 PM BY Corwin Levins, MD  BP Readings from Last 3 Encounters:  11/14/20 132/76  09/13/20 124/70  09/07/20 125/79   Stable, pt to continue medical treatment cardizem , lasix

## 2020-11-14 NOTE — Assessment & Plan Note (Signed)
BP Readings from Last 3 Encounters:  11/14/20 132/76  09/13/20 124/70  09/07/20 125/79   Stable, pt to continue medical treatment cardizem , lasix

## 2020-11-14 NOTE — Progress Notes (Signed)
Patient ID: Alexandra Reyes, female   DOB: 25-Sep-1925, 87 y.o.   MRN: [id number]        Chief Complaint: epigatric pain, nausea and wt loss       HPI:  Alexandra Reyes is a 87 y.o. female here with 10 das worsening epigastric discomfort, nausea with few vomiting, reflux at times lower mid chest, and reduced po intake with 10 lb wt loss and reduced po intake.   Pt denies fever, night sweats, loss of appetite, or other constitutional symptoms  Has not had this previously and just miserable. Pt denies chest pain, increased sob or doe, wheezing, orthopnea, PND, increased LE swelling, palpitations, dizziness or syncope.   Pt denies polydipsia, polyuria, or new focal neuro s/s.  Has recent left shoulder surgury still sore the the upper lateral arm but o/w doing ok with tramadol.  Does not think tramadol is the cause as has been taking ok previously.  GB is intact  Good compliance with prilosec 40.  Has seen Dr Perry/GI in past       Wt Readings from Last 3 Encounters:  11/14/20 113 lb (51.3 kg)  09/03/20 124 lb (56.2 kg)  08/23/20 124 lb 8 oz (56.5 kg)   BP Readings from Last 3 Encounters:  11/14/20 132/76  09/13/20 124/70  09/07/20 125/79         Past Medical History:  Diagnosis Date  . Age-related macular degeneration, wet, both eyes (HCC)   . Alcohol dependence in remission (HCC) 05/22/2011  . Allergic rhinitis, cause unspecified 05/22/2011  . Allergy   . Anemia, iron deficiency 05/18/2011  . Cholelithiasis   . COPD (chronic obstructive pulmonary disease) (HCC)   . DDD (degenerative disc disease), lumbar 05/18/2011  . Depression    "@ times" (09/19/2016)  . First degree AV block   . GERD (gastroesophageal reflux disease)   . Hiatal hernia   . History of blood transfusion    "3 or 4 related to childbirth"  . HTN (hypertension) 05/18/2011   pt denies this hx on 09/19/2016  . Hyperlipidemia 05/18/2011  . Macular degeneration   . Myocardial infarction (HCC)    "EKG shows I've had 2; date  unknown" (09/19/2016)  . OA (osteoarthritis)    "a little here and there; mainly in the back" (09/19/2016)  . Osteoporosis 05/18/2011  . PAT (paroxysmal atrial tachycardia) (HCC)   . Pectus excavatum 05/18/2011  . Personal history of colonic polyps 10/28/2012  . Pneumonia    "as a child"  . SVT (supraventricular tachycardia) (HCC)   . Urinary frequency    Past Surgical History:  Procedure Laterality Date  . ABDOMINAL HYSTERECTOMY  1988  . BREAST BIOPSY Right 1948   "it was ok"  . CARDIAC CATHETERIZATION    . CARDIOVASCULAR STRESS TEST  03/08/2009   EF 84%  . CATARACT EXTRACTION W/ INTRAOCULAR LENS  IMPLANT, BILATERAL Bilateral   . INGUINAL HERNIA REPAIR Right 06/23/2017   Procedure: REPAIR INCARCERATED  RIGHT FEMORAL HERNIA WITH MESH;  Surgeon: Harriette Bouillon, MD;  Location: [facility] OR;  Service: General;  Laterality: Right;  . KNEE ARTHROSCOPY Right   . REVERSE SHOULDER ARTHROPLASTY Left 09/05/2020   Procedure: REVERSE SHOULDER ARTHROPLASTY;  Surgeon: Yolonda Kida, MD;  Location: [facility] OR;  Service: Orthopedics;  Laterality: Left;  . RIGHT/LEFT HEART CATH AND CORONARY ANGIOGRAPHY N/A 07/25/2016   Procedure: Right/Left Heart Cath and Coronary Angiography;  Surgeon: Kathleene Hazel, MD;  Location: [facility] INVASIVE CV LAB;  Service: Cardiovascular;  Laterality: N/A;  . SVT ABLATION  09/19/2016  . SVT ABLATION N/A 09/19/2016   Procedure: SVT Ablation;  Surgeon: Hillis RangeJames Allred, MD;  [facility];  Service: Cardiovascular;  Laterality: N/A;  . TONSILLECTOMY AND ADENOIDECTOMY    . US ECHOCARDIOGRAPHY  01/17/2003   EF 55-60%    reports that she quit smoking about 26 years ago. Her smoking use included cigarettes. She started smoking about 61 years ago. She has a 54.00 pack-year smoking history. She has never used smokeless tobacco. She reports that she does not drink alcohol and does not use drugs. family history includes Arthritis in her mother; Heart disease in her father; Heart  failure in her mother; Hypertension in her father; Kidney failure in her father. Allergies  Allergen Reactions  . Oxycodone Other (See Comments)    hallucinations   Current Outpatient Medications on File Prior to Visit  Medication Sig Dispense Refill  . acetaminophen (TYLENOL) 500 MG tablet Take 1,000 mg by mouth 2 (two) times daily as needed for moderate pain or headache.    . ALPRAZolam (XANAX) 0.25 MG tablet TAKE ONE TABLET BY MOUTH EVERY NIGHT AT BEDTIME AS NEEDED FOR ANXIETY 90 tablet 1  . amLODipine (NORVASC) 5 MG tablet TAKE ONE TABLET BY MOUTH DAILY (Patient taking differently: Take 5 mg by mouth daily.) 90 tablet 1  . Ascorbic Acid (VITAMIN C PO) Take 500 mg by mouth daily. Reported on 08/08/2015    . aspirin 81 MG tablet Take 81 mg by mouth at bedtime.    . B Complex Vitamins (VITAMIN B COMPLEX PO) Take 1 tablet by mouth daily.    . budesonide-formoterol (SYMBICORT) 80-4.5 MCG/ACT inhaler INHALE TWO PUFFS BY MOUTH TWICE A DAY *WAIT 20 MINUTES BETWEEN EACH PUFF* 10.2 g 5  . calcium carbonate (OS-CAL) 600 MG tablet Take 600 mg by mouth daily.    Marland Kitchen. CARTIA XT 120 MG 24 hr capsule TAKE ONE CAPSULE BY MOUTH DAILY (Patient taking differently: Take 120 mg by mouth daily.) 90 capsule 3  . diltiazem (CARDIZEM) 30 MG tablet Take 1 tablet (30 mg total) by mouth 4 (four) times daily as needed. (Patient taking differently: Take 30 mg by mouth 4 (four) times daily as needed (Tachycardia).) 120 tablet 11  . furosemide (LASIX) 40 MG tablet TAKE ONE TABLET BY MOUTH DAILY (Patient taking differently: Take 40 mg by mouth daily.) 90 tablet 3  . Glucosamine HCl-MSM (GLUCOSAMINE-MSM PO) Take 2 tablets by mouth daily.     . nitroGLYCERIN (NITROSTAT) 0.4 MG SL tablet Place 1 tablet (0.4 mg total) under the tongue every 5 (five) minutes as needed for chest pain. 25 tablet 3  . omeprazole (PRILOSEC) 40 MG capsule TAKE ONE CAPSULE BY MOUTH DAILY 90 capsule 1  . Respiratory Therapy Supplies (FLUTTER) DEVI Use as  directed 1 each 0  . simvastatin (ZOCOR) 10 MG tablet Take 1 tablet (10 mg total) by mouth daily. (Patient taking differently: Take 10 mg by mouth daily at 6 PM.) 90 tablet 3  . traMADol (ULTRAM) 50 MG tablet TAKE ONE TABLET BY MOUTH TWICE A DAY AS NEEDED (Patient taking differently: Take 50 mg by mouth every 12 (twelve) hours as needed for moderate pain.) 60 tablet 2  . triamcinolone cream (KENALOG) 0.1 % Apply 1 application topically daily as needed (hives). Apply to affected area 30 g 0  . vitamin E 200 UNIT capsule Take 200 Units by mouth daily.    Marland Kitchen. gabapentin (NEURONTIN) 100 MG capsule TAKE  ONE CAPSULE BY MOUTH THREE TIMES A DAY (Patient not taking: Reported on 11/14/2020) 90 capsule 10  . oxyCODONE (ROXICODONE) 5 MG immediate release tablet Take 1 tablet (5 mg total) by mouth every 8 (eight) hours as needed. (Patient not taking: Reported on 11/14/2020) 20 tablet 0   No current facility-administered medications on file prior to visit.        ROS:  All others reviewed and negative.  Objective        PE:  BP 132/76 (BP Location: Left Arm, Patient Position: Sitting, Cuff Size: Normal)   Pulse 82   Ht 5\' 3"  (1.6 m)   Wt 113 lb (51.3 kg)   LMP  (LMP Unknown)   SpO2 99%   BMI 20.02 kg/m                 Constitutional: Pt appears in NAD               HENT: Head: NCAT.                Right Ear: External ear normal.                 Left Ear: External ear normal.                Eyes: . Pupils are equal, round, and reactive to light. Conjunctivae and EOM are normal               Nose: without d/c or deformity               Neck: Neck supple. Gross normal ROM               Cardiovascular: Normal rate and regular rhythm.                 Pulmonary/Chest: Effort normal and breath sounds without rales or wheezing.                Abd:  Soft, mild epigastric tender, ND, + BS, no organomegaly               Neurological: Pt is alert. At baseline orientation, motor grossly intact               Skin:  Skin is warm. No rashes, no other new lesions, LE edema - none               Psychiatric: Pt behavior is normal without agitation   Micro: none  Cardiac tracings I have personally interpreted today:  none  Pertinent Radiological findings (summarize): none   Lab Results  Component Value Date   WBC 12.0 (H) 09/13/2020   HGB 10.8 (L) 09/13/2020   HCT 32.3 (L) 09/13/2020   PLT 704.0 (H) 09/13/2020   GLUCOSE 98 09/13/2020   CHOL 178 07/05/2020   TRIG 108.0 07/05/2020   HDL 75.60 07/05/2020   LDLDIRECT 129.2 05/15/2011   LDLCALC 81 07/05/2020   ALT 21 09/13/2020   AST 21 09/13/2020   NA 132 (L) 09/13/2020   K 4.0 09/13/2020   CL 97 09/13/2020   CREATININE 0.80 09/13/2020   BUN 16 09/13/2020   CO2 25 09/13/2020   TSH 1.67 07/05/2020   INR 1.0 07/17/2016   HGBA1C 5.8 07/05/2020   Assessment/Plan:  Alexandra Reyes is a 87 y.o. White or Caucasian [1] female with  has a past medical history of Age-related macular degeneration, wet, both eyes (HCC), Alcohol dependence in remission (HCC) (05/22/2011), Allergic  rhinitis, cause unspecified (05/22/2011), Allergy, Anemia, iron deficiency (05/18/2011), Cholelithiasis, COPD (chronic obstructive pulmonary disease) (HCC), DDD (degenerative disc disease), lumbar (05/18/2011), Depression, First degree AV block, GERD (gastroesophageal reflux disease), Hiatal hernia, History of blood transfusion, HTN (hypertension) (05/18/2011), Hyperlipidemia (05/18/2011), Macular degeneration, Myocardial infarction (HCC), OA (osteoarthritis), Osteoporosis (05/18/2011), PAT (paroxysmal atrial tachycardia) (HCC), Pectus excavatum (05/18/2011), Personal history of colonic polyps (10/28/2012), Pneumonia, SVT (supraventricular tachycardia) (HCC), and Urinary frequency.  Gastroesophageal reflux disease For change PPI as above  HTN (hypertension) BP Readings from Last 3 Encounters:  11/14/20 132/76  09/13/20 124/70  09/07/20 125/79   Stable, pt to continue medical treatment  cardizem , lasix   Weight loss Ok for ensure bid  Nausea For zofran prn,  to f/u any worsening symptoms or concerns  Gastritis without bleeding For change PPI to protonix 40 bid, labs as ordered, abd u/s and refer GI  Followup: Return if symptoms worsen or fail to improve.  Oliver Barre, MD 11/14/2020 8:22 PM  [facility] - [facility] Internal Medicine

## 2020-11-14 NOTE — Patient Instructions (Signed)
I suspect you have gastritis vs something like a gallbladder problem  Ok to stop the prilosec (omeprazole)  Please take all new medication as prescribed - the protonix at 40 mg twice per day for acid and gastritis  Please take all new medication as prescribed - the zofran as needed for nausea  You will be contacted regarding the referral for: Ultrasound for the gallbladder  You will be contacted regarding the referral for: Dr Marina Goodell - Gastroenterology  Please continue all other medications as before, including the tramadol as needed for pain  Please have the pharmacy call with any other refills you may need.  Please keep your appointments with your specialists as you may have planned  Please go to the LAB at the blood drawing area for the tests to be done  You will be contacted by phone if any changes need to be made immediately.  Otherwise, you will receive a letter about your results with an explanation, but please check with MyChart first.  Please remember to sign up for MyChart if you have not done so, as this will be important to you in the future with finding out test results, communicating by private email, and scheduling acute appointments online when needed.  Please make an Appointment to return in 1 month, or sooner if needed

## 2020-11-14 NOTE — Assessment & Plan Note (Signed)
For change PPI to protonix 40 bid, labs as ordered, abd u/s and refer GI

## 2020-11-14 NOTE — Assessment & Plan Note (Signed)
Ok for ensure bid

## 2020-11-14 NOTE — Assessment & Plan Note (Signed)
For change PPI as above 

## 2020-11-14 NOTE — Assessment & Plan Note (Signed)
For zofran prn,  to f/u any worsening symptoms or concerns 

## 2020-11-15 ENCOUNTER — Encounter: Payer: Self-pay | Admitting: Internal Medicine

## 2020-11-15 ENCOUNTER — Other Ambulatory Visit: Payer: Self-pay | Admitting: Internal Medicine

## 2020-11-15 LAB — BASIC METABOLIC PANEL
BUN: 21 mg/dL (ref 6–23)
CO2: 25 mEq/L (ref 19–32)
Calcium: 9.5 mg/dL (ref 8.4–10.5)
Chloride: 96 mEq/L (ref 96–112)
Creatinine, Ser: 1.06 mg/dL (ref 0.40–1.20)
GFR: 45.54 mL/min — ABNORMAL LOW (ref >60.00)
Glucose, Bld: 87 mg/dL (ref 70–99)
Potassium: 4.5 mEq/L (ref 3.5–5.1)
Sodium: 132 mEq/L — ABNORMAL LOW (ref 135–145)

## 2020-11-15 LAB — URINALYSIS, ROUTINE W REFLEX MICROSCOPIC
Bilirubin Urine: NEGATIVE
Ketones, ur: NEGATIVE
Nitrite: NEGATIVE
Specific Gravity, Urine: <=1.005 — AB (ref 1.000–1.030)
Total Protein, Urine: NEGATIVE
Urine Glucose: NEGATIVE
Urobilinogen, UA: 0.2 (ref 0.0–1.0)
pH: 6 (ref 5.0–8.0)

## 2020-11-15 LAB — HEPATIC FUNCTION PANEL
ALT: 16 U/L (ref 0–35)
AST: 21 U/L (ref 0–37)
Albumin: 4.4 g/dL (ref 3.5–5.2)
Alkaline Phosphatase: 100 U/L (ref 39–117)
Bilirubin, Direct: 0 mg/dL (ref 0.0–0.3)
Total Bilirubin: 0.5 mg/dL (ref 0.2–1.2)
Total Protein: 7.2 g/dL (ref 6.0–8.3)

## 2020-11-15 LAB — LIPASE: Lipase: 54 U/L (ref 11.0–59.0)

## 2020-11-15 MED ORDER — CEPHALEXIN 500 MG PO CAPS
500.0000 mg | ORAL_CAPSULE | Freq: Three times a day (TID) | ORAL | 0 refills | Status: AC
Start: 2020-11-15 — End: 2020-11-25

## 2020-11-17 ENCOUNTER — Ambulatory Visit
Admission: RE | Admit: 2020-11-17 | Discharge: 2020-11-17 | Disposition: A | Discharge status: Home / Self Care | Payer: Medicare Other | Source: Ambulatory Visit | Attending: Internal Medicine | Admitting: Internal Medicine

## 2020-11-17 ENCOUNTER — Encounter: Payer: Self-pay | Admitting: Internal Medicine

## 2020-11-17 DIAGNOSIS — I700 Atherosclerosis of aorta: Secondary | ICD-10-CM | POA: Diagnosis not present

## 2020-11-17 DIAGNOSIS — R110 Nausea: Secondary | ICD-10-CM | POA: Diagnosis not present

## 2020-11-17 DIAGNOSIS — K219 Gastro-esophageal reflux disease without esophagitis: Secondary | ICD-10-CM

## 2020-11-17 DIAGNOSIS — K2970 Gastritis, unspecified, without bleeding: Secondary | ICD-10-CM

## 2020-11-17 DIAGNOSIS — N281 Cyst of kidney, acquired: Secondary | ICD-10-CM | POA: Diagnosis not present

## 2020-11-17 DIAGNOSIS — R634 Abnormal weight loss: Secondary | ICD-10-CM

## 2020-11-20 DIAGNOSIS — N816 Rectocele: Secondary | ICD-10-CM | POA: Diagnosis not present

## 2020-11-21 ENCOUNTER — Ambulatory Visit: Payer: Medicare Other | Admitting: Physical Medicine & Rehabilitation

## 2020-12-04 DIAGNOSIS — Z4789 Encounter for other orthopedic aftercare: Secondary | ICD-10-CM | POA: Diagnosis not present

## 2020-12-10 ENCOUNTER — Other Ambulatory Visit: Payer: Self-pay | Admitting: Internal Medicine

## 2020-12-12 ENCOUNTER — Telehealth: Payer: Self-pay | Admitting: Internal Medicine

## 2020-12-12 ENCOUNTER — Other Ambulatory Visit: Payer: Self-pay | Admitting: Internal Medicine

## 2020-12-12 NOTE — Telephone Encounter (Signed)
Called pt back in regards to BP and black out.  She reports that Friday night while walking from bathroom to chair she became weak and her knees gave out.  She was able to fall into a chair.  She reports she blacked out for a few minutes.  She reports that she was upset with her son prior to event and felt nauseated.  She sat in chair for a few minutes and then checked BP 152/112.  She sat around in chair for a while and BP came down to 144/92.  She reports that on Saturday she felt a little better and DBP in 80's.  Today she feels weak and shaky.  She was upset with her son today and took PRN xanax to help her relax.  She reports that she drank an ensure, bowl of cereal and an apple.  Per pt she drinks tea and water all day and voids without difficulty.  She reports that she takes heart meds as ordered.  Pt noted to be SOB while speaking with me.  I advised her to call 911 to be evaluated if symptoms return.  She is scheduled for an appointment with Dr. Johney Frame tomorrow 12/13/20.  She thanked me for returning her call.

## 2020-12-12 NOTE — Telephone Encounter (Signed)
New message   Pt c/o BP issue: STAT if pt c/o blurred vision, one-sided weakness or slurred speech  1. What are your last 5 BP readings? 101/112 (Friday) 146/100, 126/75  2. Are you having any other symptoms (ex. Dizziness, headache, blurred vision, passed out)? Pt states she blacked out, not for long but she doesn't remember.  3. What is your BP issue? Pt has concerns with her BP. She requested appt with Dr. Johney Frame, scheduled tomorrow with at [facility]. Would like to speak with someone.

## 2020-12-13 ENCOUNTER — Other Ambulatory Visit: Payer: Self-pay

## 2020-12-13 ENCOUNTER — Encounter (HOSPITAL_BASED_OUTPATIENT_CLINIC_OR_DEPARTMENT_OTHER): Payer: Self-pay | Admitting: Internal Medicine

## 2020-12-13 ENCOUNTER — Ambulatory Visit (INDEPENDENT_AMBULATORY_CARE_PROVIDER_SITE_OTHER): Payer: Medicare Other | Admitting: Internal Medicine

## 2020-12-13 VITALS — BP 142/74 | HR 89 | Ht 63.0 in | Wt 111.0 lb

## 2020-12-13 DIAGNOSIS — R079 Chest pain, unspecified: Secondary | ICD-10-CM | POA: Diagnosis not present

## 2020-12-13 DIAGNOSIS — R0602 Shortness of breath: Secondary | ICD-10-CM | POA: Diagnosis not present

## 2020-12-13 DIAGNOSIS — G8929 Other chronic pain: Secondary | ICD-10-CM | POA: Diagnosis not present

## 2020-12-13 DIAGNOSIS — I471 Supraventricular tachycardia, unspecified: Secondary | ICD-10-CM

## 2020-12-13 DIAGNOSIS — I10 Essential (primary) hypertension: Secondary | ICD-10-CM | POA: Diagnosis not present

## 2020-12-13 MED ORDER — DILTIAZEM HCL 30 MG PO TABS: 30.0000 mg | ORAL_TABLET | Freq: Four times a day (QID) | ORAL | 11 refills | Status: DC | PRN

## 2020-12-13 MED ORDER — AMLODIPINE BESYLATE 5 MG PO TABS: 5.0000 mg | ORAL_TABLET | Freq: Every day | ORAL | 3 refills | Status: DC

## 2020-12-13 MED ORDER — FUROSEMIDE 20 MG PO TABS
20.0000 mg | ORAL_TABLET | Freq: Every day | ORAL | 3 refills | Status: DC
Start: 2020-12-13 — End: 2021-12-06

## 2020-12-13 NOTE — Patient Instructions (Addendum)
Medication Instructions:  Reduce Lasix to 20 mg daily Your physician recommends that you continue on your current medications as directed. Please refer to the Current Medication list given to you today.  Labwork: None ordered.  Testing/Procedures: Your physician has requested that you have an echocardiogram. Echocardiography is a painless test that uses sound waves to create images of your heart. It provides your doctor with information about the size and shape of your heart and how well your heart's chambers and valves are working. This procedure takes approximately one hour. There are no restrictions for this procedure.   Follow-Up: Your physician wants you to follow-up in: 6 weeks with Dr. Anne Fu team. 1 year with Francis Dowse PA.   Any Other Special Instructions Will Be Listed Below (If Applicable).  If you need a refill on your cardiac medications before your next appointment, please call your pharmacy.

## 2020-12-13 NOTE — Progress Notes (Signed)
PCP: Corwin Levins, MD Primary Cardiologist: Dr Anne Fu Primary EP: Dr Johney Frame  Alexandra Reyes is a 87 y.o. female who presents today for routine electrophysiology followup. She has declined over the past year.  In March, she fell and injured her shoulder. She required surgery.  She has had weight loss and feeling unwell.  Her family does not feel that she should drive.   She has poor appetite and poor PO intake.  On this past Friday, she was weak while walking down the hall.  She feel to the floor due to weakness.  She does not think that she had LOC for any prolonged time.  She states "I may have blacked out for a second".  She has venous insufficiency which has been worse since not wearing her support hose.  Today, she denies symptoms of palpitations, chest pain.  The patient is otherwise without complaint today.   Past Medical History:  Diagnosis Date   Age-related macular degeneration, wet, both eyes (HCC)    Alcohol dependence in remission (HCC) 05/22/2011   Allergic rhinitis, cause unspecified 05/22/2011   Allergy    Anemia, iron deficiency 05/18/2011   Cholelithiasis    COPD (chronic obstructive pulmonary disease) (HCC)    DDD (degenerative disc disease), lumbar 05/18/2011   Depression    "@ times" (09/19/2016)   First degree AV block    GERD (gastroesophageal reflux disease)    Hiatal hernia    History of blood transfusion    "3 or 4 related to childbirth"   HTN (hypertension) 05/18/2011   pt denies this hx on 09/19/2016   Hyperlipidemia 05/18/2011   Macular degeneration    Myocardial infarction (HCC)    "EKG shows I've had 2; date unknown" (09/19/2016)   OA (osteoarthritis)    "a little here and there; mainly in the back" (09/19/2016)   Osteoporosis 05/18/2011   PAT (paroxysmal atrial tachycardia) (HCC)    Pectus excavatum 05/18/2011   Personal history of colonic polyps 10/28/2012   Pneumonia    "as a child"   SVT (supraventricular tachycardia) (HCC)    Urinary frequency     Past Surgical History:  Procedure Laterality Date   ABDOMINAL HYSTERECTOMY  1988   BREAST BIOPSY Right 1948   "it was ok"   CARDIAC CATHETERIZATION     CARDIOVASCULAR STRESS TEST  03/08/2009   EF 84%   CATARACT EXTRACTION W/ INTRAOCULAR LENS  IMPLANT, BILATERAL Bilateral    INGUINAL HERNIA REPAIR Right 06/23/2017   Procedure: REPAIR INCARCERATED  RIGHT FEMORAL HERNIA WITH MESH;  Surgeon: Harriette Bouillon, MD;  Location: [facility] OR;  Service: General;  Laterality: Right;   KNEE ARTHROSCOPY Right    REVERSE SHOULDER ARTHROPLASTY Left 09/05/2020   Procedure: REVERSE SHOULDER ARTHROPLASTY;  Surgeon: Yolonda Kida, MD;  Location: [facility] OR;  Service: Orthopedics;  Laterality: Left;   RIGHT/LEFT HEART CATH AND CORONARY ANGIOGRAPHY N/A 07/25/2016   Procedure: Right/Left Heart Cath and Coronary Angiography;  Surgeon: Kathleene Hazel, MD;  Location: [facility] INVASIVE CV LAB;  Service: Cardiovascular;  Laterality: N/A;   SVT ABLATION  09/19/2016   SVT ABLATION N/A 09/19/2016   Procedure: SVT Ablation;  Surgeon: Hillis Range, MD;  Location: [facility] INVASIVE CV LAB;  Service: Cardiovascular;  Laterality: N/A;   TONSILLECTOMY AND ADENOIDECTOMY     US ECHOCARDIOGRAPHY  01/17/2003   EF 55-60%    ROS- all systems are reviewed and negatives except as per HPI above  Current Outpatient Medications  Medication Sig Dispense  Refill   acetaminophen (TYLENOL) 500 MG tablet Take 1,000 mg by mouth 2 (two) times daily as needed for moderate pain or headache.     ALPRAZolam (XANAX) 0.25 MG tablet TAKE ONE TABLET BY MOUTH EVERY NIGHT AT BEDTIME AS NEEDED FOR ANXIETY 90 tablet 1   amLODipine (NORVASC) 5 MG tablet TAKE ONE TABLET BY MOUTH DAILY 90 tablet 1   Ascorbic Acid (VITAMIN C PO) Take 500 mg by mouth daily. Reported on 08/08/2015     aspirin 81 MG tablet Take 81 mg by mouth at bedtime.     B Complex Vitamins (VITAMIN B COMPLEX PO) Take 1 tablet by mouth daily.     budesonide-formoterol (SYMBICORT) 80-4.5 MCG/ACT  inhaler INHALE TWO PUFFS BY MOUTH TWICE A DAY *WAIT 20 MINUTES BETWEEN EACH PUFF* 10.2 g 5   calcium carbonate (OS-CAL) 600 MG tablet Take 600 mg by mouth daily.     CARTIA XT 120 MG 24 hr capsule TAKE ONE CAPSULE BY MOUTH DAILY 90 capsule 3   diltiazem (CARDIZEM) 30 MG tablet Take 1 tablet (30 mg total) by mouth 4 (four) times daily as needed. 120 tablet 11   furosemide (LASIX) 40 MG tablet TAKE ONE TABLET BY MOUTH DAILY 90 tablet 3   Glucosamine HCl-MSM (GLUCOSAMINE-MSM PO) Take 2 tablets by mouth daily.      nitroGLYCERIN (NITROSTAT) 0.4 MG SL tablet Place 1 tablet (0.4 mg total) under the tongue every 5 (five) minutes as needed for chest pain. 25 tablet 3   omeprazole (PRILOSEC) 40 MG capsule TAKE ONE CAPSULE BY MOUTH DAILY 90 capsule 1   ondansetron (ZOFRAN) 4 MG tablet Take 1 tablet (4 mg total) by mouth every 8 (eight) hours as needed for nausea or vomiting. 30 tablet 1   pantoprazole (PROTONIX) 40 MG tablet Take 1 tablet (40 mg total) by mouth 2 (two) times daily before a meal. 60 tablet 5   Respiratory Therapy Supplies (FLUTTER) DEVI Use as directed 1 each 0   simvastatin (ZOCOR) 10 MG tablet Take 1 tablet (10 mg total) by mouth daily. (Patient taking differently: Take 10 mg by mouth daily at 6 PM.) 90 tablet 3   traMADol (ULTRAM) 50 MG tablet TAKE ONE TABLET BY MOUTH TWICE A DAY AS NEEDED (Patient taking differently: Take 50 mg by mouth every 12 (twelve) hours as needed for moderate pain.) 60 tablet 2   triamcinolone cream (KENALOG) 0.1 % Apply 1 application topically daily as needed (hives). Apply to affected area 30 g 0   vitamin E 200 UNIT capsule Take 200 Units by mouth daily.     No current facility-administered medications for this visit.    Physical Exam: Vitals:   12/13/20 1412  BP: (!) 142/74  Pulse: 89  SpO2: 98%  Weight: 111 lb (50.3 kg)  Height: 5\' 3"  (1.6 m)    GEN- The patient is elderly and frail appearing, alert and oriented x 3 today.   Head- normocephalic,  atraumatic Eyes-  Sclera clear, conjunctiva pink Ears- hearing intact Oropharynx- clear with dry MM Lungs- Clear to ausculation bilaterally, normal work of breathing Heart- Regular rate and rhythm, no murmurs, rubs or gallops, PMI not laterally displaced GI- soft, NT, ND, + BS Extremities- + venous insufficiency with venous stasis changes  Wt Readings from Last 3 Encounters:  12/13/20 111 lb (50.3 kg)  11/14/20 113 lb (51.3 kg)  09/03/20 124 lb (56.2 kg)    EKG tracing ordered today is personally reviewed and shows sinus with incomplete  lbbb (chronic)  Assessment and Plan:  Syncope Labs from 6/22 are reviewed and suggest dehydration.  She appears dry on exam today.   Reduce lasix to 20mg  daily.  Adequate hydration and heat avoidance advised. Echo to evaluate for structural changes.  My suspicion for arrhythmia is low by history.  Consider 30 day monitor if she has further episodes.  2. SVT Well controlled post ablation for AVNRT She prefer to continue low dose diltiazem  3. Atypical chest pain secondary to costochondritis Stable No change required today  4. HTN Continue amlodipine 5mg  daily Swelling is worsened by amlodipine Consider reducing in the future  5. CAD No ischemic symptoms No changes  6. Venous insufficiency Reduce lasix as above Consider reducing norvasc which is making worse Support hose advised as well as leg elevation  Risks, benefits and potential toxicities for medications prescribed and/or refilled reviewed with patient today.   Follow-up with general cardiology team Return to see EP APP annually I will see as needed  MD, [facility] 12/13/2020 2:21 PM

## 2020-12-14 ENCOUNTER — Other Ambulatory Visit: Payer: Self-pay | Admitting: Internal Medicine

## 2020-12-14 ENCOUNTER — Telehealth: Payer: Self-pay | Admitting: Internal Medicine

## 2020-12-14 MED ORDER — DILTIAZEM HCL ER COATED BEADS 120 MG PO CP24
120.0000 mg | ORAL_CAPSULE | Freq: Every day | ORAL | 3 refills | Status: DC
Start: 2020-12-14 — End: 2021-12-04

## 2020-12-14 NOTE — Telephone Encounter (Signed)
  *  STAT* If patient is at the pharmacy, call can be transferred to refill team.   1. Which medications need to be refilled? (please list name of each medication and dose if known) CARTIA XT 120 MG 24 hr capsule  2. Which pharmacy/location (including street and city if local pharmacy) is medication to be sent to? [facility] [id number] [place], [place] - [address]  3. Do they need a 30 day or 90 day supply? 90 days

## 2020-12-14 NOTE — Telephone Encounter (Signed)
Pt's medication was sent to pt's pharmacy as requested. Confirmation received.  °

## 2020-12-15 ENCOUNTER — Other Ambulatory Visit: Payer: Self-pay

## 2020-12-21 ENCOUNTER — Encounter: Payer: Medicare Other | Admitting: Physical Medicine & Rehabilitation

## 2020-12-22 ENCOUNTER — Encounter: Payer: Self-pay | Admitting: Internal Medicine

## 2021-01-02 ENCOUNTER — Other Ambulatory Visit: Payer: Self-pay

## 2021-01-02 ENCOUNTER — Ambulatory Visit (INDEPENDENT_AMBULATORY_CARE_PROVIDER_SITE_OTHER): Payer: Medicare Other | Admitting: Internal Medicine

## 2021-01-02 VITALS — BP 118/60 | HR 78 | Temp 97.7°F | Ht 63.0 in | Wt 107.0 lb

## 2021-01-02 DIAGNOSIS — I10 Essential (primary) hypertension: Secondary | ICD-10-CM

## 2021-01-02 DIAGNOSIS — K219 Gastro-esophageal reflux disease without esophagitis: Secondary | ICD-10-CM | POA: Diagnosis not present

## 2021-01-02 DIAGNOSIS — J449 Chronic obstructive pulmonary disease, unspecified: Secondary | ICD-10-CM | POA: Diagnosis not present

## 2021-01-02 DIAGNOSIS — I700 Atherosclerosis of aorta: Secondary | ICD-10-CM | POA: Insufficient documentation

## 2021-01-02 DIAGNOSIS — R739 Hyperglycemia, unspecified: Secondary | ICD-10-CM

## 2021-01-02 DIAGNOSIS — K2970 Gastritis, unspecified, without bleeding: Secondary | ICD-10-CM | POA: Diagnosis not present

## 2021-01-02 NOTE — Patient Instructions (Signed)
Ok to stop the amlodipine due to being on the diltiazem and the lower BP today and the dizziness  Please try to take at least 2 cans of Ensure between meals every day  Please continue all other medications as before, and refills have been done if requested.  Please have the pharmacy call with any other refills you may need  Please keep your appointments with your specialists as you may have planned  Please make an Appointment to return in 6 months, or sooner if needed

## 2021-01-02 NOTE — Progress Notes (Signed)
Chief Complaint: follow up HTN, aortic atherosclerosis, recent wt loss, gastritis       HPI:  Alexandra Reyes is a 87 y.o. female here overall much improved with gastirits symptoms on bid PPI and no further GI symptoms including nausea for 2 wks.  Denies worsening reflux, abd pain, dysphagia, n/v, bowel change or blood.  Peak wt was 155 lbs about 4 yrs ago, and has another 6 lbs since I last saw her for unclear reasons    Pt denies fever, night sweats, loss of appetite, or other constitutional symptoms, and is working on trying to increase her calories  Lasix decreased to 20 mg per Dr Tillie Rung after syncope for 1 min episode per pt.   Trying to get a f/u appt with Dr Anne Fu but has no appt to Nov 2022, but than able to get appt with Card PA in late aug 2022.  Pt currently is on two calcium channel blockers.    Pt denies polydipsia, polyuria, or new focal neuro s/s.  Denies worsening depressive symptoms, suicidal ideation, or panic; has ongoing anxiety     Wt Readings from Last 3 Encounters:  01/02/21 107 lb (48.5 kg)  12/13/20 111 lb (50.3 kg)  11/14/20 113 lb (51.3 kg)   BP Readings from Last 3 Encounters:  01/02/21 118/60  12/13/20 (!) 142/74  11/14/20 132/76         Past Medical History:  Diagnosis Date   Age-related macular degeneration, wet, both eyes (HCC)    Alcohol dependence in remission (HCC) 05/22/2011   Allergic rhinitis, cause unspecified 05/22/2011   Allergy    Anemia, iron deficiency 05/18/2011   Cholelithiasis    COPD (chronic obstructive pulmonary disease) (HCC)    DDD (degenerative disc disease), lumbar 05/18/2011   Depression    "@ times" (09/19/2016)   First degree AV block    GERD (gastroesophageal reflux disease)    Hiatal hernia    History of blood transfusion    "3 or 4 related to childbirth"   HTN (hypertension) 05/18/2011   pt denies this hx on 09/19/2016   Hyperlipidemia 05/18/2011   Macular degeneration    Myocardial infarction (HCC)    "EKG shows I've  had 2; date unknown" (09/19/2016)   OA (osteoarthritis)    "a little here and there; mainly in the back" (09/19/2016)   Osteoporosis 05/18/2011   PAT (paroxysmal atrial tachycardia) (HCC)    Pectus excavatum 05/18/2011   Personal history of colonic polyps 10/28/2012   Pneumonia    "as a child"   SVT (supraventricular tachycardia) (HCC)    Urinary frequency    Past Surgical History:  Procedure Laterality Date   ABDOMINAL HYSTERECTOMY  1988   BREAST BIOPSY Right 1948   "it was ok"   CARDIAC CATHETERIZATION     CARDIOVASCULAR STRESS TEST  03/08/2009   EF 84%   CATARACT EXTRACTION W/ INTRAOCULAR LENS  IMPLANT, BILATERAL Bilateral    INGUINAL HERNIA REPAIR Right 06/23/2017   Procedure: REPAIR INCARCERATED  RIGHT FEMORAL HERNIA WITH MESH;  Surgeon: Harriette Bouillon, MD;  Location: [facility] OR;  Service: General;  Laterality: Right;   KNEE ARTHROSCOPY Right    REVERSE SHOULDER ARTHROPLASTY Left 09/05/2020   Procedure: REVERSE SHOULDER ARTHROPLASTY;  Surgeon: Yolonda Kida, MD;  Location: [facility] OR;  Service: Orthopedics;  Laterality: Left;   RIGHT/LEFT HEART CATH AND CORONARY ANGIOGRAPHY N/A 07/25/2016   Procedure: Right/Left Heart Cath and Coronary Angiography;  Surgeon: Kathleene Hazel,  MD;  Location: [facility] INVASIVE CV LAB;  Service: Cardiovascular;  Laterality: N/A;   SVT ABLATION  09/19/2016   SVT ABLATION N/A 09/19/2016   Procedure: SVT Ablation;  Surgeon: Hillis Range, MD;  Location: [facility] INVASIVE CV LAB;  Service: Cardiovascular;  Laterality: N/A;   TONSILLECTOMY AND ADENOIDECTOMY     US ECHOCARDIOGRAPHY  01/17/2003   EF 55-60%    reports that she quit smoking about 26 years ago. Her smoking use included cigarettes. She started smoking about 61 years ago. She has a 54.00 pack-year smoking history. She has never used smokeless tobacco. She reports that she does not drink alcohol and does not use drugs. family history includes Arthritis in her mother; Heart disease in her father; Heart failure  in her mother; Hypertension in her father; Kidney failure in her father. Allergies  Allergen Reactions   Oxycodone Other (See Comments)    hallucinations   Current Outpatient Medications on File Prior to Visit  Medication Sig Dispense Refill   acetaminophen (TYLENOL) 500 MG tablet Take 1,000 mg by mouth 2 (two) times daily as needed for moderate pain or headache.     ALPRAZolam (XANAX) 0.25 MG tablet TAKE ONE TABLET BY MOUTH EVERY NIGHT AT BEDTIME AS NEEDED FOR ANXIETY 90 tablet 1   Ascorbic Acid (VITAMIN C PO) Take 500 mg by mouth daily. Reported on 08/08/2015     aspirin 81 MG tablet Take 81 mg by mouth at bedtime.     B Complex Vitamins (VITAMIN B COMPLEX PO) Take 1 tablet by mouth daily.     budesonide-formoterol (SYMBICORT) 80-4.5 MCG/ACT inhaler INHALE TWO PUFFS BY MOUTH TWICE A DAY *WAIT 20 MINUTES BETWEEN EACH PUFF* 10.2 g 5   calcium carbonate (OS-CAL) 600 MG tablet Take 600 mg by mouth daily.     diltiazem (CARDIZEM) 30 MG tablet Take 1 tablet (30 mg total) by mouth 4 (four) times daily as needed. 60 tablet 11   diltiazem (CARTIA XT) 120 MG 24 hr capsule Take 1 capsule (120 mg total) by mouth daily. 90 capsule 3   furosemide (LASIX) 20 MG tablet Take 1 tablet (20 mg total) by mouth daily. 90 tablet 3   Glucosamine HCl-MSM (GLUCOSAMINE-MSM PO) Take 2 tablets by mouth daily.      nitroGLYCERIN (NITROSTAT) 0.4 MG SL tablet Place 1 tablet (0.4 mg total) under the tongue every 5 (five) minutes as needed for chest pain. 25 tablet 3   ondansetron (ZOFRAN) 4 MG tablet Take 1 tablet (4 mg total) by mouth every 8 (eight) hours as needed for nausea or vomiting. 30 tablet 1   pantoprazole (PROTONIX) 40 MG tablet Take 1 tablet (40 mg total) by mouth 2 (two) times daily before a meal. 60 tablet 5   Respiratory Therapy Supplies (FLUTTER) DEVI Use as directed 1 each 0   simvastatin (ZOCOR) 10 MG tablet Take 1 tablet (10 mg total) by mouth daily. (Patient taking differently: Take 10 mg by mouth daily  at 6 PM.) 90 tablet 3   traMADol (ULTRAM) 50 MG tablet TAKE ONE TABLET BY MOUTH TWICE A DAY AS NEEDED (Patient taking differently: Take 50 mg by mouth every 12 (twelve) hours as needed for moderate pain.) 60 tablet 2   triamcinolone cream (KENALOG) 0.1 % Apply 1 application topically daily as needed (hives). Apply to affected area 30 g 0   vitamin E 200 UNIT capsule Take 200 Units by mouth daily.     omeprazole (PRILOSEC) 40 MG capsule TAKE ONE CAPSULE BY  MOUTH DAILY (Patient not taking: Reported on 01/02/2021) 90 capsule 1   No current facility-administered medications on file prior to visit.        ROS:  All others reviewed and negative.  Objective        PE:  BP 118/60   Pulse 78   Temp 97.7 F (36.5 C) (Oral)   Ht 5\' 3"  (1.6 m)   Wt 107 lb (48.5 kg)   LMP  (LMP Unknown)   SpO2 97%   BMI 18.95 kg/m                 Constitutional: Pt appears in NAD               HENT: Head: NCAT.                Right Ear: External ear normal.                 Left Ear: External ear normal.                Eyes: . Pupils are equal, round, and reactive to light. Conjunctivae and EOM are normal               Nose: without d/c or deformity               Neck: Neck supple. Gross normal ROM               Cardiovascular: Normal rate and regular rhythm.                 Pulmonary/Chest: Effort normal and breath sounds without rales or wheezing.                Abd:  Soft, NT, ND, + BS, no organomegaly               Neurological: Pt is alert. At baseline orientation, motor grossly intact               Skin: Skin is warm. No rashes, no other new lesions, LE edema - none               Psychiatric: Pt behavior is normal without agitation   Micro: none  Cardiac tracings I have personally interpreted today:  none  Pertinent Radiological findings (summarize): none   Lab Results  Component Value Date   WBC 8.3 11/14/2020   HGB 14.6 11/14/2020   HCT 44.9 11/14/2020   PLT 423.0 (H) 11/14/2020   GLUCOSE 87  11/14/2020   CHOL 178 07/05/2020   TRIG 108.0 07/05/2020   HDL 75.60 07/05/2020   LDLDIRECT 129.2 05/15/2011   LDLCALC 81 07/05/2020   ALT 16 11/14/2020   AST 21 11/14/2020   NA 132 (L) 11/14/2020   K 4.5 11/14/2020   CL 96 11/14/2020   CREATININE 1.06 11/14/2020   BUN 21 11/14/2020   CO2 25 11/14/2020   TSH 1.67 07/05/2020   INR 1.0 07/17/2016   HGBA1C 5.8 07/05/2020   Assessment/Plan:  TANIJA GERMANI is a 87 y.o. White or Caucasian [1] female with  has a past medical history of Age-related macular degeneration, wet, both eyes (HCC), Alcohol dependence in remission (HCC) (05/22/2011), Allergic rhinitis, cause unspecified (05/22/2011), Allergy, Anemia, iron deficiency (05/18/2011), Cholelithiasis, COPD (chronic obstructive pulmonary disease) (HCC), DDD (degenerative disc disease), lumbar (05/18/2011), Depression, First degree AV block, GERD (gastroesophageal reflux disease), Hiatal hernia, History of blood transfusion, HTN (hypertension) (05/18/2011), Hyperlipidemia (05/18/2011), Macular degeneration, Myocardial  infarction (HCC), OA (osteoarthritis), Osteoporosis (05/18/2011), PAT (paroxysmal atrial tachycardia) (HCC), Pectus excavatum (05/18/2011), Personal history of colonic polyps (10/28/2012), Pneumonia, SVT (supraventricular tachycardia) (HCC), and Urinary frequency.  Aortic atherosclerosis (HCC) Pt to continue diet, exercise; declines statin  HTN (hypertension) With recent recurrent dizziness; now on amlodipine and diltiazem, will d/c amlodipine and follow dizziness and BP,  to f/u any worsening symptoms or concerns  COPD (chronic obstructive pulmonary disease) Stable with current symbicort,  to f/u any worsening symptoms or concerns  Hyperglycemia Lab Results  Component Value Date   HGBA1C 5.8 07/05/2020   Stable, pt to continue current medical treatment  - diet   Gastritis without bleeding Much improved symptomatically per pt, to continue bid PPI for now given ongoing wt  loss,  to f/u any worsening symptoms or concerns  Gastroesophageal reflux disease Improved now on bid PPI  Followup: Return in about 6 months (around 07/05/2021), or if symptoms worsen or fail to improve.  Oliver BarreJames Mataya Kilduff, MD 01/04/2021 5:49 AM [facility]

## 2021-01-03 ENCOUNTER — Ambulatory Visit (HOSPITAL_COMMUNITY): Payer: Medicare Other | Attending: Internal Medicine

## 2021-01-03 DIAGNOSIS — I10 Essential (primary) hypertension: Secondary | ICD-10-CM | POA: Diagnosis not present

## 2021-01-03 DIAGNOSIS — I471 Supraventricular tachycardia, unspecified: Secondary | ICD-10-CM

## 2021-01-03 DIAGNOSIS — G8929 Other chronic pain: Secondary | ICD-10-CM

## 2021-01-03 DIAGNOSIS — R55 Syncope and collapse: Secondary | ICD-10-CM | POA: Diagnosis not present

## 2021-01-03 DIAGNOSIS — R079 Chest pain, unspecified: Secondary | ICD-10-CM | POA: Insufficient documentation

## 2021-01-03 DIAGNOSIS — R0602 Shortness of breath: Secondary | ICD-10-CM | POA: Diagnosis not present

## 2021-01-03 LAB — ECHOCARDIOGRAM COMPLETE
Area-P 1/2: 3.93 cm2
S' Lateral: 2.6 cm

## 2021-01-04 ENCOUNTER — Telehealth: Payer: Self-pay | Admitting: Internal Medicine

## 2021-01-04 ENCOUNTER — Encounter: Payer: Self-pay | Admitting: Internal Medicine

## 2021-01-04 NOTE — Assessment & Plan Note (Signed)
Lab Results  °Component Value Date  ° HGBA1C 5.8 07/05/2020  ° °Stable, pt to continue current medical treatment  - diet °

## 2021-01-04 NOTE — Assessment & Plan Note (Signed)
With recent recurrent dizziness; now on amlodipine and diltiazem, will d/c amlodipine and follow dizziness and BP,  to f/u any worsening symptoms or concerns

## 2021-01-04 NOTE — Assessment & Plan Note (Signed)
Stable with current symbicort,  to f/u any worsening symptoms or concerns

## 2021-01-04 NOTE — Telephone Encounter (Signed)
The patient has been notified of the result and verbalized understanding.  All questions (if any) were answered. Sampson Goon, RN 01/04/2021 11:18 AM

## 2021-01-04 NOTE — Assessment & Plan Note (Signed)
>>  ASSESSMENT AND PLAN FOR HTN (HYPERTENSION) WRITTEN ON 01/04/2021  5:40 AM BY Corwin Levins, MD  With recent recurrent dizziness; now on amlodipine and diltiazem, will d/c amlodipine and follow dizziness and BP,  to f/u any worsening symptoms or concerns

## 2021-01-04 NOTE — Assessment & Plan Note (Signed)
Improved now on bid PPI

## 2021-01-04 NOTE — Telephone Encounter (Signed)
Patient is returning call to discuss echo results. °

## 2021-01-04 NOTE — Assessment & Plan Note (Signed)
Pt to continue diet, exercise; declines statin

## 2021-01-04 NOTE — Assessment & Plan Note (Signed)
Much improved symptomatically per pt, to continue bid PPI for now given ongoing wt loss,  to f/u any worsening symptoms or concerns

## 2021-01-09 ENCOUNTER — Telehealth: Payer: Self-pay | Admitting: Internal Medicine

## 2021-01-09 DIAGNOSIS — I10 Essential (primary) hypertension: Secondary | ICD-10-CM

## 2021-01-09 DIAGNOSIS — R634 Abnormal weight loss: Secondary | ICD-10-CM

## 2021-01-09 DIAGNOSIS — E7849 Other hyperlipidemia: Secondary | ICD-10-CM

## 2021-01-09 DIAGNOSIS — R739 Hyperglycemia, unspecified: Secondary | ICD-10-CM

## 2021-01-09 NOTE — Telephone Encounter (Signed)
Last OV: 01/02/21 Last OV weight loss mentioned for ensure bid 11/14/20.

## 2021-01-09 NOTE — Telephone Encounter (Signed)
  Patient calling to report Ensure is not helping her to maintain weight. She is drinking 2 a day She is down to 103  Seeking advice

## 2021-01-09 NOTE — Telephone Encounter (Signed)
Ok I have referred to medical nutrition counseling

## 2021-01-10 DIAGNOSIS — H43813 Vitreous degeneration, bilateral: Secondary | ICD-10-CM | POA: Diagnosis not present

## 2021-01-10 DIAGNOSIS — H43392 Other vitreous opacities, left eye: Secondary | ICD-10-CM | POA: Diagnosis not present

## 2021-01-10 DIAGNOSIS — H35371 Puckering of macula, right eye: Secondary | ICD-10-CM | POA: Diagnosis not present

## 2021-01-10 DIAGNOSIS — H353233 Exudative age-related macular degeneration, bilateral, with inactive scar: Secondary | ICD-10-CM | POA: Diagnosis not present

## 2021-01-11 ENCOUNTER — Other Ambulatory Visit: Payer: Self-pay | Admitting: Internal Medicine

## 2021-01-11 MED ORDER — MEGESTROL ACETATE 40 MG PO TABS: 40.0000 mg | ORAL_TABLET | Freq: Every day | ORAL | 1 refills | Status: DC

## 2021-01-11 NOTE — Telephone Encounter (Signed)
Ok for megace 40 mg - done erx

## 2021-01-11 NOTE — Telephone Encounter (Signed)
Patient calling in about weight loss.. Says the medical nutrition counseling office called her but her insurance does not cover classes & classes will be $200 oop   Patient says she can not afford to pay for the classes & would like for provider to prescribed her something to increase her appetite   Req callback [phone]

## 2021-01-18 NOTE — Telephone Encounter (Signed)
Ok to stop this medication and continue to monitor the weight

## 2021-01-18 NOTE — Telephone Encounter (Signed)
Patient started taking megestrol (MEGACE) 40 MG tablet about 1 week ago  feeling fatigue, tired, all she wants to do is sleep, feeling dizzy/weak  BP read 122/78 today  Patient wants to know if she needs to continue taking this medication  Callback # [phone]

## 2021-01-23 NOTE — Telephone Encounter (Signed)
Patient has been contacted and advised as seen by provider. Advised patient to let us know if there are any other problems.

## 2021-01-26 NOTE — Telephone Encounter (Signed)
Ok for [employer] if this helps, but also consider change the ensure to boost if having too much loose stools

## 2021-01-26 NOTE — Telephone Encounter (Signed)
Patient notified  Ok for metamucil if this helps, but also consider change the ensure to boost if having too much loose stools

## 2021-01-31 NOTE — Progress Notes (Signed)
Cardiology Office Note    Date:  02/06/2021   ID:  Jahne, Krukowski [birth year], MRN [id number]   PCP:  Corwin Levins, MD   [facility]  Cardiologist:  Donato Schultz, MD   Advanced Practice Provider:  No care team member to display Electrophysiologist:  Hillis Range, MD   [phone]   Chief Complaint  Patient presents with   Follow-up     History of Present Illness:  Alexandra Reyes is a 87 y.o. female with history of SVT, atypical chest pain felt secondary to costochondritis at times, hypertension, CAD with CTO of the RCA on cath in 2018, venous insufficiency,  Patient saw Dr. Johney Frame 12/13/2020 at which time she had a decline in her health with a fall and shoulder injury requiring surgery as well as poor appetite and poor oral intake.  She had fallen to the floor due to weakness and felt she may have blacked out for a second.  She does have venous insufficiency was not wearing support hose.  Dr. Johney Frame reviewed the notes and felt she was dehydrated and reduce Lasix to 20 mg once daily avoid heat and ordered an echo which showed normal LVEF grade 1 DD no valvular abnormalities. Consider 30-day monitor if recurrent episodes.  Consider reducing Norvasc in the future support hose.  Patient comes in with her son. PCP stopped amlodipine and is wearing support hose. Says heart raced for about a week after stopping amlodipine and diastolic BP was running high but now it has leveled out. Now that she's drinking ensure it helps. Has chronic chest pain and dyspnea that is unchanged. She does ok if she paces herself. Leg swelling down. Her biggest problem in nausea and diarrhea being followed by Dr. Jonny Ruiz  Past Medical History:  Diagnosis Date   Age-related macular degeneration, wet, both eyes (HCC)    Alcohol dependence in remission (HCC) 05/22/2011   Allergic rhinitis, cause unspecified 05/22/2011   Allergy    Anemia, iron deficiency 05/18/2011   Cholelithiasis     COPD (chronic obstructive pulmonary disease) (HCC)    DDD (degenerative disc disease), lumbar 05/18/2011   Depression    "@ times" (09/19/2016)   First degree AV block    GERD (gastroesophageal reflux disease)    Hiatal hernia    History of blood transfusion    "3 or 4 related to childbirth"   HTN (hypertension) 05/18/2011   pt denies this hx on 09/19/2016   Hyperlipidemia 05/18/2011   Macular degeneration    Myocardial infarction (HCC)    "EKG shows I've had 2; date unknown" (09/19/2016)   OA (osteoarthritis)    "a little here and there; mainly in the back" (09/19/2016)   Osteoporosis 05/18/2011   PAT (paroxysmal atrial tachycardia) (HCC)    Pectus excavatum 05/18/2011   Personal history of colonic polyps 10/28/2012   Pneumonia    "as a child"   SVT (supraventricular tachycardia) (HCC)    Urinary frequency     Past Surgical History:  Procedure Laterality Date   ABDOMINAL HYSTERECTOMY  1988   BREAST BIOPSY Right 1948   "it was ok"   CARDIAC CATHETERIZATION     CARDIOVASCULAR STRESS TEST  03/08/2009   EF 84%   CATARACT EXTRACTION W/ INTRAOCULAR LENS  IMPLANT, BILATERAL Bilateral    INGUINAL HERNIA REPAIR Right 06/23/2017   Procedure: REPAIR INCARCERATED  RIGHT FEMORAL HERNIA WITH MESH;  Surgeon: Harriette Bouillon, MD;  Location: [facility] OR;  Service: General;  Laterality: Right;   KNEE ARTHROSCOPY Right    REVERSE SHOULDER ARTHROPLASTY Left 09/05/2020   Procedure: REVERSE SHOULDER ARTHROPLASTY;  Surgeon: Yolonda Kida, MD;  Location: [facility] OR;  Service: Orthopedics;  Laterality: Left;   RIGHT/LEFT HEART CATH AND CORONARY ANGIOGRAPHY N/A 07/25/2016   Procedure: Right/Left Heart Cath and Coronary Angiography;  Surgeon: Kathleene Hazel, MD;  Location: [facility] INVASIVE CV LAB;  Service: Cardiovascular;  Laterality: N/A;   SVT ABLATION  09/19/2016   SVT ABLATION N/A 09/19/2016   Procedure: SVT Ablation;  Surgeon: Hillis Range, MD;  [facility] INV[facility] CV LAB;  Service: Cardiovascular;   Laterality: N/A;   TONSILLECTOMY AND ADENOIDECTOMY     US ECHOCARDIOGRAPHY  01/17/2003   EF 55-60%    Current Medications: Current Meds  Medication Sig   acetaminophen (TYLENOL) 500 MG tablet Take 1,000 mg by mouth 2 (two) times daily as needed for moderate pain or headache.   ALPRAZolam (XANAX) 0.25 MG tablet TAKE ONE TABLET BY MOUTH EVERY NIGHT AT BEDTIME AS NEEDED FOR ANXIETY   Ascorbic Acid (VITAMIN C PO) Take 500 mg by mouth daily. Reported on 08/08/2015   aspirin 81 MG tablet Take 81 mg by mouth at bedtime.   B Complex Vitamins (VITAMIN B COMPLEX PO) Take 1 tablet by mouth daily.   budesonide-formoterol (SYMBICORT) 80-4.5 MCG/ACT inhaler INHALE TWO PUFFS BY MOUTH TWICE A DAY *WAIT 20 MINUTES BETWEEN EACH PUFF*   calcium carbonate (OS-CAL) 600 MG tablet Take 600 mg by mouth daily.   diltiazem (CARDIZEM) 30 MG tablet Take 1 tablet (30 mg total) by mouth 4 (four) times daily as needed.   diltiazem (CARTIA XT) 120 MG 24 hr capsule Take 1 capsule (120 mg total) by mouth daily.   furosemide (LASIX) 20 MG tablet Take 1 tablet (20 mg total) by mouth daily.   Glucosamine HCl-MSM (GLUCOSAMINE-MSM PO) Take 2 tablets by mouth daily.    nitroGLYCERIN (NITROSTAT) 0.4 MG SL tablet Place 1 tablet (0.4 mg total) under the tongue every 5 (five) minutes as needed for chest pain.   omeprazole (PRILOSEC) 40 MG capsule TAKE ONE CAPSULE BY MOUTH DAILY   ondansetron (ZOFRAN) 4 MG tablet Take 1 tablet (4 mg total) by mouth every 8 (eight) hours as needed for nausea or vomiting.   pantoprazole (PROTONIX) 40 MG tablet Take 1 tablet (40 mg total) by mouth 2 (two) times daily before a meal.   Respiratory Therapy Supplies (FLUTTER) DEVI Use as directed   simvastatin (ZOCOR) 10 MG tablet Take 1 tablet (10 mg total) by mouth daily. (Patient taking differently: Take 10 mg by mouth daily at 6 PM.)   traMADol (ULTRAM) 50 MG tablet TAKE ONE TABLET BY MOUTH TWICE A DAY AS NEEDED (Patient taking differently: Take 50 mg by  mouth every 12 (twelve) hours as needed for moderate pain.)   triamcinolone cream (KENALOG) 0.1 % Apply 1 application topically daily as needed (hives). Apply to affected area   vitamin E 200 UNIT capsule Take 200 Units by mouth daily.     Allergies:   Oxycodone   Social History   Socioeconomic History   Marital status: Widowed    Spouse name: Not on file   Number of children: 2   Years of education: 108   Highest education level: Not on file  Occupational History   Occupation: Retired Designer, jewellery  Tobacco Use   Smoking status: Former    Packs/day: 1.50    Years: 36.00    Pack years: 54.00  Types: Cigarettes    Start date: 03/01/1959    Quit date: 06/10/1994    Years since quitting: 26.6   Smokeless tobacco: Never  Vaping Use   Vaping Use: Never used  Substance and Sexual Activity   Alcohol use: No   Drug use: No   Sexual activity: Never  Other Topics Concern   Not on file  Social History Narrative   [place] She moved to [place] in 1950 during the polio epidemic. Previously worked as a Engineer, civil (consulting)nurse. No pets currently. Remote bird exposure. No mold or hot tub exposure.    Social Determinants of Health   Financial Resource Strain: Not on file  Food Insecurity: Not on file  Transportation Needs: Not on file  Physical Activity: Not on file  Stress: Not on file  Social Connections: Not on file     Family History:  The patient's  family history includes Arthritis in her mother; Heart disease in her father; Heart failure in her mother; Hypertension in her father; Kidney failure in her father.   ROS:   Please see the history of present illness.    ROS All other systems reviewed and are negative.   PHYSICAL EXAM:   VS:  BP 136/74   Pulse (!) 57   Ht 5\' 3"  (1.6 m)   Wt 106 lb (48.1 kg)   LMP  (LMP Unknown)   SpO2 94%   BMI 18.78 kg/m   Physical Exam  GEN: Well nourished, well developed, in no acute distress  Neck: no JVD, carotid bruits, or  masses Cardiac:RRR; no murmurs, rubs, or gallops  Respiratory:  clear to auscultation bilaterally, normal work of breathing GI: soft, nontender, nondistended, + BS Ext: without cyanosis, clubbing, or edema, Good distal pulses bilaterally Neuro:  Alert and Oriented x 3 Psych: euthymic mood, full affect  Wt Readings from Last 3 Encounters:  02/06/21 106 lb (48.1 kg)  01/02/21 107 lb (48.5 kg)  12/13/20 111 lb (50.3 kg)      Studies/Labs Reviewed:   EKG:  EKG is not ordered today.     Recent Labs: 07/05/2020: TSH 1.67 09/07/2020: Magnesium 1.9 11/14/2020: ALT 16; BUN 21; Creatinine, Ser 1.06; Hemoglobin 14.6; Platelets 423.0; Potassium 4.5; Sodium 132   Lipid Panel    Component Value Date/Time   CHOL 178 07/05/2020 1356   CHOL 160 09/30/2016 1205   TRIG 108.0 07/05/2020 1356   HDL 75.60 07/05/2020 1356   HDL 73 09/30/2016 1205   CHOLHDL 2 07/05/2020 1356   VLDL 21.6 07/05/2020 1356   LDLCALC 81 07/05/2020 1356   LDLCALC 69 09/30/2016 1205   LDLDIRECT 129.2 05/15/2011 0925    Additional studies/ records that were reviewed today include:  Cath 2018: Prox RCA lesion, 100 %stenosed. Prox LAD lesion, 20 %stenosed. LV end diastolic pressure is normal.   1. 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 The mid and distal vessel fills briskly from left to right collaterals.  2. Mild non-obstructive disease in the LAD 3. Normal filling pressures.  4. Normal PA pressures   Diagnostic Dominance: Right Intervention     2D echo 01/03/2021 IMPRESSIONS     1. Left ventricular ejection fraction, by estimation, is 55 to 60%. The  left ventricle has normal function. The left ventricle has no regional  wall motion abnormalities. Left ventricular diastolic parameters are  consistent with Grade I diastolic  dysfunction (impaired relaxation).   2. Right ventricular systolic function is normal. The right ventricular  size  is normal. There is normal pulmonary artery systolic pressure.    3. The mitral valve is grossly normal. No evidence of mitral valve  regurgitation. No evidence of mitral stenosis.   4. The aortic valve is tricuspid. There is mild calcification of the  aortic valve. There is mild thickening of the aortic valve. Aortic valve  regurgitation is not visualized. Aortic valve sclerosis is present, with  no evidence of aortic valve stenosis.   5. Aortic dilatation noted. There is borderline dilatation of the  ascending aorta, measuring 39 mm.   Comparison(s): A prior study was performed on 07/01/2015. Prior images  unable to be directly viewed, comparison made by report only. Similar to  prior report.    Risk Assessment/Calculations:         ASSESSMENT:    1. Coronary artery disease involving native coronary artery of native heart without angina pectoris   2. Paroxysmal SVT (supraventricular tachycardia) (HCC)   3. Syncope, unspecified syncope type   4. Essential hypertension   5. Other hyperlipidemia      PLAN:  In order of problems listed above:  CAD with CTO of the RCA on cath in 2018 medical therapy history of atypical chest pain felt secondary to costochondritis, no changes in her symptoms. Continue current treatment  History of AVNRT ablation by Dr. Lourdes Sledge  Recent syncope felt secondary to dehydration 2D echo showed normal LVEF 55 to 60% with grade 1 DD no significant valvular disease.  See above for details. Amlodipine stopped and now wearing compression hose.  Hypertension-BP stable off meds. Can reduce lasix to prn if needed in future.  Hyperlipidemia-LDL 81.   Shared Decision Making/Informed Consent        Medication Adjustments/Labs and Tests Ordered: Current medicines are reviewed at length with the patient today.  Concerns regarding medicines are outlined above.  Medication changes, Labs and Tests ordered today are listed in the Patient Instructions below. There are no Patient Instructions on file for this visit.    Elson Clan, PA-C  02/06/2021 11:55 AM    [facility] [address] [place], [place], [place]  [address] Phone: [phone]; Fax: [phone]

## 2021-02-06 ENCOUNTER — Other Ambulatory Visit: Payer: Self-pay

## 2021-02-06 ENCOUNTER — Ambulatory Visit (INDEPENDENT_AMBULATORY_CARE_PROVIDER_SITE_OTHER): Payer: Medicare Other | Admitting: Physician Assistant

## 2021-02-06 ENCOUNTER — Telehealth: Payer: Self-pay

## 2021-02-06 ENCOUNTER — Encounter: Payer: Self-pay | Admitting: Physician Assistant

## 2021-02-06 VITALS — BP 136/74 | HR 57 | Ht 63.0 in | Wt 106.0 lb

## 2021-02-06 DIAGNOSIS — I10 Essential (primary) hypertension: Secondary | ICD-10-CM

## 2021-02-06 DIAGNOSIS — I471 Supraventricular tachycardia, unspecified: Secondary | ICD-10-CM

## 2021-02-06 DIAGNOSIS — E7849 Other hyperlipidemia: Secondary | ICD-10-CM

## 2021-02-06 DIAGNOSIS — R55 Syncope and collapse: Secondary | ICD-10-CM | POA: Diagnosis not present

## 2021-02-06 DIAGNOSIS — I2510 Atherosclerotic heart disease of native coronary artery without angina pectoris: Secondary | ICD-10-CM

## 2021-02-06 NOTE — Patient Instructions (Signed)

## 2021-02-06 NOTE — Telephone Encounter (Signed)
Please advise as the pt is wanting to know if she can take the whole dose of Imodium instead of the child's dose. Pt is taking 1/2 a pill and wants to know can she take a whole pill?  **Pt also wants to know how long can she take imodium for as it has been helping her with her diarrhea a lot. Pt states she has taken it for 2 days already.  **Pt states she has a rectal prolapse and states any time she eats she goes to the restroom or has a lot of gas and wants to now if it is ok to take gas a/w imodium?  Please call pt back at [phone].  **Pt states she is drinking 2 boost a day and eating with still no weight gain as she is now 103 lbs.

## 2021-02-07 NOTE — Telephone Encounter (Signed)
Whole pill is ok, and there is no time limit I am aware that she would have to stop

## 2021-02-08 NOTE — Telephone Encounter (Signed)
LDVM for pt of Dr. [name]'s instructions.  

## 2021-02-11 ENCOUNTER — Other Ambulatory Visit: Payer: Self-pay | Admitting: Internal Medicine

## 2021-02-28 ENCOUNTER — Other Ambulatory Visit: Payer: Self-pay

## 2021-03-01 ENCOUNTER — Ambulatory Visit (INDEPENDENT_AMBULATORY_CARE_PROVIDER_SITE_OTHER): Payer: Medicare Other | Admitting: Internal Medicine

## 2021-03-01 ENCOUNTER — Encounter: Payer: Self-pay | Admitting: Internal Medicine

## 2021-03-01 VITALS — BP 134/78 | HR 84 | Ht 63.0 in | Wt 104.0 lb

## 2021-03-01 DIAGNOSIS — F32A Depression, unspecified: Secondary | ICD-10-CM | POA: Diagnosis not present

## 2021-03-01 DIAGNOSIS — R739 Hyperglycemia, unspecified: Secondary | ICD-10-CM | POA: Diagnosis not present

## 2021-03-01 DIAGNOSIS — R110 Nausea: Secondary | ICD-10-CM | POA: Diagnosis not present

## 2021-03-01 DIAGNOSIS — R634 Abnormal weight loss: Secondary | ICD-10-CM | POA: Diagnosis not present

## 2021-03-01 DIAGNOSIS — I2510 Atherosclerotic heart disease of native coronary artery without angina pectoris: Secondary | ICD-10-CM

## 2021-03-01 MED ORDER — CITALOPRAM HYDROBROMIDE 10 MG PO TABS: 10.0000 mg | ORAL_TABLET | Freq: Every day | ORAL | 3 refills | Status: DC

## 2021-03-01 NOTE — Progress Notes (Signed)
Patient ID: Alexandra Reyes, female   DOB: 1926/02/15, 87 y.o.   MRN: [id number]        Chief Complaint: follow up GI complaints, wt loss, depression       HPI:  Alexandra Reyes is a 87 y.o. female here with c/o 1 mo worsening gas glaotin, diarrhea and nausea now resolved except for intermittent nausea; Taking immodium for occas gas and diarrhea, and occas gasx.  Has ongoing rectal prolapse followed per GI, wondering if can cause the nausea.  Has had some wt loss as well but not clear is related;  Wt down 3 lbs despite change PPI and taking Boost.  Admits to midl to mod worsening depressive symptoms, but no suicidal ideation, or panic; has ongoing anxiety increased recently as well.  No fever, vomiting, blood.  Pt denies chest pain, increased sob or doe, wheezing, orthopnea, PND, increased LE swelling, palpitations, dizziness or syncope.   Pt denies polydipsia, polyuria, Wt Readings from Last 3 Encounters:  03/01/21 104 lb (47.2 kg)  02/06/21 106 lb (48.1 kg)  01/02/21 107 lb (48.5 kg)   BP Readings from Last 3 Encounters:  03/01/21 134/78  02/06/21 136/74  01/02/21 118/60         Past Medical History:  Diagnosis Date   Age-related macular degeneration, wet, both eyes (HCC)    Alcohol dependence in remission (HCC) 05/22/2011   Allergic rhinitis, cause unspecified 05/22/2011   Allergy    Anemia, iron deficiency 05/18/2011   Cholelithiasis    COPD (chronic obstructive pulmonary disease) (HCC)    DDD (degenerative disc disease), lumbar 05/18/2011   Depression    "@ times" (09/19/2016)   First degree AV block    GERD (gastroesophageal reflux disease)    Hiatal hernia    History of blood transfusion    "3 or 4 related to childbirth"   HTN (hypertension) 05/18/2011   pt denies this hx on 09/19/2016   Hyperlipidemia 05/18/2011   Macular degeneration    Myocardial infarction (HCC)    "EKG shows I've had 2; date unknown" (09/19/2016)   OA (osteoarthritis)    "a little here and there; mainly  in the back" (09/19/2016)   Osteoporosis 05/18/2011   PAT (paroxysmal atrial tachycardia) (HCC)    Pectus excavatum 05/18/2011   Personal history of colonic polyps 10/28/2012   Pneumonia    "as a child"   SVT (supraventricular tachycardia) (HCC)    Urinary frequency    Past Surgical History:  Procedure Laterality Date   ABDOMINAL HYSTERECTOMY  1988   BREAST BIOPSY Right 1948   "it was ok"   CARDIAC CATHETERIZATION     CARDIOVASCULAR STRESS TEST  03/08/2009   EF 84%   CATARACT EXTRACTION W/ INTRAOCULAR LENS  IMPLANT, BILATERAL Bilateral    INGUINAL HERNIA REPAIR Right 06/23/2017   Procedure: REPAIR INCARCERATED  RIGHT FEMORAL HERNIA WITH MESH;  Surgeon: Harriette Bouillon, MD;  Location: [facility] OR;  Service: General;  Laterality: Right;   KNEE ARTHROSCOPY Right    REVERSE SHOULDER ARTHROPLASTY Left 09/05/2020   Procedure: REVERSE SHOULDER ARTHROPLASTY;  Surgeon: Yolonda Kida, MD;  Location: [facility] OR;  Service: Orthopedics;  Laterality: Left;   RIGHT/LEFT HEART CATH AND CORONARY ANGIOGRAPHY N/A 07/25/2016   Procedure: Right/Left Heart Cath and Coronary Angiography;  Surgeon: Kathleene Hazel, MD;  Location: [facility] INVASIVE CV LAB;  Service: Cardiovascular;  Laterality: N/A;   SVT ABLATION  09/19/2016   SVT ABLATION N/A 09/19/2016   Procedure: SVT Ablation;  Surgeon:  Hillis Range, MD;  Location: [facility] INVASIVE CV LAB;  Service: Cardiovascular;  Laterality: N/A;   TONSILLECTOMY AND ADENOIDECTOMY     US ECHOCARDIOGRAPHY  01/17/2003   EF 55-60%    reports that she quit smoking about 26 years ago. Her smoking use included cigarettes. She started smoking about 62 years ago. She has a 54.00 pack-year smoking history. She has never used smokeless tobacco. She reports that she does not drink alcohol and does not use drugs. family history includes Arthritis in her mother; Heart disease in her father; Heart failure in her mother; Hypertension in her father; Kidney failure in her father. Allergies  Allergen  Reactions   Oxycodone Other (See Comments)    hallucinations   Current Outpatient Medications on File Prior to Visit  Medication Sig Dispense Refill   acetaminophen (TYLENOL) 500 MG tablet Take 1,000 mg by mouth 2 (two) times daily as needed for moderate pain or headache.     ALPRAZolam (XANAX) 0.25 MG tablet TAKE ONE TABLET BY MOUTH EVERY NIGHT AT BEDTIME AS NEEDED FOR ANXIETY 90 tablet 1   Ascorbic Acid (VITAMIN C PO) Take 500 mg by mouth daily. Reported on 08/08/2015     aspirin 81 MG tablet Take 81 mg by mouth at bedtime.     B Complex Vitamins (VITAMIN B COMPLEX PO) Take 1 tablet by mouth daily.     budesonide-formoterol (SYMBICORT) 80-4.5 MCG/ACT inhaler INHALE TWO PUFFS BY MOUTH TWICE A DAY *WAIT 20 MINUTES BETWEEN EACH PUFF* 10.2 g 5   calcium carbonate (OS-CAL) 600 MG tablet Take 600 mg by mouth daily.     diltiazem (CARDIZEM) 30 MG tablet Take 1 tablet (30 mg total) by mouth 4 (four) times daily as needed. 60 tablet 11   diltiazem (CARTIA XT) 120 MG 24 hr capsule Take 1 capsule (120 mg total) by mouth daily. 90 capsule 3   furosemide (LASIX) 20 MG tablet Take 1 tablet (20 mg total) by mouth daily. 90 tablet 3   Glucosamine HCl-MSM (GLUCOSAMINE-MSM PO) Take 2 tablets by mouth daily.      nitroGLYCERIN (NITROSTAT) 0.4 MG SL tablet Place 1 tablet (0.4 mg total) under the tongue every 5 (five) minutes as needed for chest pain. 25 tablet 3   ondansetron (ZOFRAN) 4 MG tablet Take 1 tablet (4 mg total) by mouth every 8 (eight) hours as needed for nausea or vomiting. 30 tablet 1   pantoprazole (PROTONIX) 40 MG tablet Take 1 tablet (40 mg total) by mouth 2 (two) times daily before a meal. 60 tablet 5   Respiratory Therapy Supplies (FLUTTER) DEVI Use as directed 1 each 0   simvastatin (ZOCOR) 10 MG tablet Take 1 tablet (10 mg total) by mouth daily. (Patient taking differently: Take 10 mg by mouth daily at 6 PM.) 90 tablet 3   traMADol (ULTRAM) 50 MG tablet TAKE ONE TABLET BY MOUTH TWICE A DAY AS  NEEDED 60 tablet 2   triamcinolone cream (KENALOG) 0.1 % Apply 1 application topically daily as needed (hives). Apply to affected area 30 g 0   vitamin E 200 UNIT capsule Take 200 Units by mouth daily.     megestrol (MEGACE) 40 MG tablet Take 1 tablet (40 mg total) by mouth daily. (Patient not taking: Reported on 03/01/2021) 90 tablet 1   No current facility-administered medications on file prior to visit.        ROS:  All others reviewed and negative.  Objective        PE:  BP 134/78 (BP Location: Left Arm, Patient Position: Sitting, Cuff Size: Normal)   Pulse 84   Ht 5\' 3"  (1.6 m)   Wt 104 lb (47.2 kg)   LMP  (LMP Unknown)   SpO2 98%   BMI 18.42 kg/m                 Constitutional: Pt appears in NAD               HENT: Head: NCAT.                Right Ear: External ear normal.                 Left Ear: External ear normal.                Eyes: . Pupils are equal, round, and reactive to light. Conjunctivae and EOM are normal               Nose: without d/c or deformity               Neck: Neck supple. Gross normal ROM               Cardiovascular: Normal rate and regular rhythm.                 Pulmonary/Chest: Effort normal and breath sounds without rales or wheezing.                Abd:  Soft, NT, ND, + BS, no organomegaly               Neurological: Pt is alert. At baseline orientation, motor grossly intact               Skin: Skin is warm. No rashes, no other new lesions, LE edema - none               Psychiatric: Pt behavior is normal without agitation , + depressed nervous affect  Micro: none  Cardiac tracings I have personally interpreted today:  none  Pertinent Radiological findings (summarize): none   Lab Results  Component Value Date   WBC 8.3 11/14/2020   HGB 14.6 11/14/2020   HCT 44.9 11/14/2020   PLT 423.0 (H) 11/14/2020   GLUCOSE 87 11/14/2020   CHOL 178 07/05/2020   TRIG 108.0 07/05/2020   HDL 75.60 07/05/2020   LDLDIRECT 129.2 05/15/2011   LDLCALC 81  07/05/2020   ALT 16 11/14/2020   AST 21 11/14/2020   NA 132 (L) 11/14/2020   K 4.5 11/14/2020   CL 96 11/14/2020   CREATININE 1.06 11/14/2020   BUN 21 11/14/2020   CO2 25 11/14/2020   TSH 1.67 07/05/2020   INR 1.0 07/17/2016   HGBA1C 5.8 07/05/2020   Assessment/Plan:  Alexandra Reyes is a 87 y.o. White or Caucasian [1] female with  has a past medical history of Age-related macular degeneration, wet, both eyes (HCC), Alcohol dependence in remission (HCC) (05/22/2011), Allergic rhinitis, cause unspecified (05/22/2011), Allergy, Anemia, iron deficiency (05/18/2011), Cholelithiasis, COPD (chronic obstructive pulmonary disease) (HCC), DDD (degenerative disc disease), lumbar (05/18/2011), Depression, First degree AV block, GERD (gastroesophageal reflux disease), Hiatal hernia, History of blood transfusion, HTN (hypertension) (05/18/2011), Hyperlipidemia (05/18/2011), Macular degeneration, Myocardial infarction (HCC), OA (osteoarthritis), Osteoporosis (05/18/2011), PAT (paroxysmal atrial tachycardia) (HCC), Pectus excavatum (05/18/2011), Personal history of colonic polyps (10/28/2012), Pneumonia, SVT (supraventricular tachycardia) (HCC), and Urinary frequency.  Nausea Exam o/w benign, etiolgy unclear, for zofran prn,  to f/u  any worsening symptoms or concerns, declines GI f/u at this time  Weight loss Etiology unclear, but possible depression related; ok for probiotic for GI symptoms as well, declines further lab eval or GI f/u, now to continue megace if she likes  Hyperglycemia Lab Results  Component Value Date   HGBA1C 5.8 07/05/2020   Stable, pt to continue current medical treatment  - diet   Depression Chronic persistent with recent worsening - for celexa 10 qd, declines referral for counseling or psyhciatry  Followup: Return in about 6 months (around 08/29/2021).  Oliver Barre, MD 03/04/2021 2:36 PM  [facility] Medicine

## 2021-03-01 NOTE — Patient Instructions (Signed)
Please take all new medication as prescribed - the celexa 10 mg per day  Ok to take the OTC Probiotic , as well as the Gasx and the Immodium as needed  You can also take the Megace, but it is probably not really needed  Please continue all other medications as before, and refills have been done if requested.  Please have the pharmacy call with any other refills you may need  Please keep your appointments with your specialists as you may have planned  Please make an Appointment to return in 6 months, or sooner if needed

## 2021-03-04 ENCOUNTER — Encounter: Payer: Self-pay | Admitting: Internal Medicine

## 2021-03-04 NOTE — Assessment & Plan Note (Signed)
Etiology unclear, but possible depression related; ok for probiotic for GI symptoms as well, declines further lab eval or GI f/u, now to continue megace if she likes

## 2021-03-04 NOTE — Assessment & Plan Note (Signed)
Lab Results  °Component Value Date  ° HGBA1C 5.8 07/05/2020  ° °Stable, pt to continue current medical treatment  - diet °

## 2021-03-04 NOTE — Assessment & Plan Note (Signed)
Exam o/w benign, etiolgy unclear, for zofran prn,  to f/u any worsening symptoms or concerns, declines GI f/u at this time

## 2021-03-04 NOTE — Assessment & Plan Note (Signed)
Chronic persistent with recent worsening - for celexa 10 qd, declines referral for counseling or psyhciatry

## 2021-03-07 ENCOUNTER — Encounter: Payer: Self-pay | Admitting: Gastroenterology

## 2021-03-13 ENCOUNTER — Telehealth: Payer: Self-pay | Admitting: *Deleted

## 2021-03-13 ENCOUNTER — Other Ambulatory Visit: Payer: Self-pay | Admitting: *Deleted

## 2021-03-13 MED ORDER — FLUTICASONE FUROATE-VILANTEROL 100-25 MCG/INH IN AEPB: 1.0000 | INHALATION_SPRAY | Freq: Every day | RESPIRATORY_TRACT | 2 refills | Status: DC

## 2021-03-13 NOTE — Telephone Encounter (Signed)
BI please advise. Thanks  Pt is on symbicort 80 and went to refill this but this is not covered by her insurance.  The following are alternatives given:  Advair HFA  [phone] Breo 100 or 200   Please advise thanks

## 2021-03-13 NOTE — Telephone Encounter (Signed)
Refill has been sent to the pharmacy. Nothing further is needed. 

## 2021-03-20 NOTE — Telephone Encounter (Signed)
Called the pharmacy and they are aware that the breo is replacing the symbicort.  He did get the breo to go through and the cost will be $42.  He is going to call the pt to make sure she is aware and let her know the cost.  Nothing further is needed.

## 2021-03-21 ENCOUNTER — Other Ambulatory Visit (INDEPENDENT_AMBULATORY_CARE_PROVIDER_SITE_OTHER): Payer: Medicare Other

## 2021-03-21 ENCOUNTER — Encounter: Payer: Self-pay | Admitting: Gastroenterology

## 2021-03-21 ENCOUNTER — Ambulatory Visit (INDEPENDENT_AMBULATORY_CARE_PROVIDER_SITE_OTHER): Payer: Medicare Other | Admitting: Gastroenterology

## 2021-03-21 VITALS — BP 102/78 | HR 72 | Ht 63.0 in | Wt 105.0 lb

## 2021-03-21 DIAGNOSIS — R110 Nausea: Secondary | ICD-10-CM

## 2021-03-21 DIAGNOSIS — R197 Diarrhea, unspecified: Secondary | ICD-10-CM

## 2021-03-21 DIAGNOSIS — R634 Abnormal weight loss: Secondary | ICD-10-CM | POA: Diagnosis not present

## 2021-03-21 LAB — BASIC METABOLIC PANEL
BUN: 31 mg/dL — ABNORMAL HIGH (ref 6–23)
CO2: 25 mEq/L (ref 19–32)
Calcium: 8.8 mg/dL (ref 8.4–10.5)
Chloride: 99 mEq/L (ref 96–112)
Creatinine, Ser: 1.16 mg/dL (ref 0.40–1.20)
GFR: 40.77 mL/min — ABNORMAL LOW (ref >60.00)
Glucose, Bld: 91 mg/dL (ref 70–99)
Potassium: 4.2 mEq/L (ref 3.5–5.1)
Sodium: 130 mEq/L — ABNORMAL LOW (ref 135–145)

## 2021-03-21 NOTE — Patient Instructions (Signed)
Stop Pantoprazole. Restart Omeprazole (Prilosec) 20 mg daily 30-60 minutes before breakfast.   Your provider has requested that you go to the basement level for lab work before leaving today. Press "B" on the elevator. The lab is located at the first door on the left as you exit the elevator.  You have been scheduled for a CT scan of the abdomen and pelvis at [facility], 1st floor Radiology. You are scheduled on Tuesday 03/27/21  at 4 pm. You should arrive 15 minutes prior to your appointment time for registration.    Please follow the written instructions below on the day of your exam:   1) Do not eat anything after 12 pm (4 hours prior to your test)   2) Drink 1 bottle of contrast @ 2 pm (2 hours prior to your exam)  Remember to shake well before drinking and do NOT pour over ice.     Drink 1 bottle of contrast @ 3 pm (1 hour prior to your exam)   You may take any medications as prescribed with a small amount of water, if necessary. If you take any of the following medications: METFORMIN, GLUCOPHAGE, GLUCOVANCE, AVANDAMET, RIOMET, FORTAMET, Katie MET, JANUMET, GLUMETZA or METAGLIP, you MAY be asked to HOLD this medication 48 hours AFTER the exam.   The purpose of you drinking the oral contrast is to aid in the visualization of your intestinal tract. The contrast solution may cause some diarrhea. Depending on your individual set of symptoms, you may also receive an intravenous injection of x-ray contrast/dye. Plan on being at [facility] for 45 minutes or longer, depending on the type of exam you are having performed.   If you have any questions regarding your exam or if you need to reschedule, you may call [facility] at [phone] between the hours of 8:00 am and 5:00 pm, Monday-Friday.   If you are age 74 or older, your body mass index should be between 23-30. Your Body mass index is 18.6 kg/m. If this is out of the aforementioned range listed, please consider follow  up with your Primary Care Provider.  If you are age 40 or younger, your body mass index should be between 19-25. Your Body mass index is 18.6 kg/m. If this is out of the aformentioned range listed, please consider follow up with your Primary Care Provider.   __________________________________________________________  The [place] [facility] providers would like to encourage you to use [facility] to communicate with providers for non-urgent requests or questions.  Due to long hold times on the telephone, sending your provider a message by [facility] may be a faster and more efficient way to get a response.  Please allow 48 business hours for a response.  Please remember that this is for non-urgent requests.

## 2021-03-21 NOTE — Progress Notes (Signed)
03/21/2021 Alexandra Reyes [id number] [birth year]   HISTORY OF PRESENT ILLNESS: This is a pleasant 87 year old female who is a patient of Dr. Lamar Sprinkles.  She was last seen here by him in August 2017 for complaints of GERD, chronic cough, atypical chest pain at that time.  She has been maintained on omeprazole 40 mg daily.  It looks like back in June she saw Dr. Jonny Ruiz and was complaining of similar symptoms so he discontinued her omeprazole and placed her on pantoprazole 40 mg twice daily.  She reports that she has been having nausea.  Then in August she started having bad diarrhea.  She feels like her symptoms are more/worse when she is anxious.  She feels like symptoms have abated over the past week or so.  She has lost about 20 to 25 pounds over the past couple of months.  She has not had any rectal bleeding.  No dysphagia for the most part.  She has been placed on Megace to try to stimulate her appetite.  It is somewhat hard to follow her and she tends to bounce around a lot, so makes it hard to get an exact timeline of symptoms.  She keeps talking about her rectal prolapse that was diagnosed 4 to 5 years ago by her gynecologist.  She also has vaginal wall prolapse and had a pessary that he was placing.  She is here today with a friend.  Colonoscopy 04/2005 with Dr. Marina Goodell showed colon polyps, diverticulosis, and internal/external hemorrhoids.  These were tubular adenomas.  Past Medical History:  Diagnosis Date   Age-related macular degeneration, wet, both eyes (HCC)    Alcohol dependence in remission (HCC) 05/22/2011   Allergic rhinitis, cause unspecified 05/22/2011   Allergy    Anemia, iron deficiency 05/18/2011   Cholelithiasis    COPD (chronic obstructive pulmonary disease) (HCC)    DDD (degenerative disc disease), lumbar 05/18/2011   Depression    "@ times" (09/19/2016)   First degree AV block    GERD (gastroesophageal reflux disease)    Hiatal hernia    History of blood transfusion     "3 or 4 related to childbirth"   HTN (hypertension) 05/18/2011   pt denies this hx on 09/19/2016   Hyperlipidemia 05/18/2011   Macular degeneration    Myocardial infarction (HCC)    "EKG shows I've had 2; date unknown" (09/19/2016)   OA (osteoarthritis)    "a little here and there; mainly in the back" (09/19/2016)   Osteoporosis 05/18/2011   PAT (paroxysmal atrial tachycardia) (HCC)    Pectus excavatum 05/18/2011   Personal history of colonic polyps 10/28/2012   Pneumonia    "as a child"   SVT (supraventricular tachycardia) (HCC)    Urinary frequency    Past Surgical History:  Procedure Laterality Date   ABDOMINAL HYSTERECTOMY  1988   BREAST BIOPSY Right 1948   "it was ok"   CARDIAC CATHETERIZATION     CARDIOVASCULAR STRESS TEST  03/08/2009   EF 84%   CATARACT EXTRACTION W/ INTRAOCULAR LENS  IMPLANT, BILATERAL Bilateral    INGUINAL HERNIA REPAIR Right 06/23/2017   Procedure: REPAIR INCARCERATED  RIGHT FEMORAL HERNIA WITH MESH;  Surgeon: Harriette Bouillon, MD;  Location: [facility] OR;  Service: General;  Laterality: Right;   KNEE ARTHROSCOPY Right    REVERSE SHOULDER ARTHROPLASTY Left 09/05/2020   Procedure: REVERSE SHOULDER ARTHROPLASTY;  Surgeon: Yolonda Kida, MD;  Location: [facility] OR;  Service: Orthopedics;  Laterality: Left;   RIGHT/LEFT HEART  CATH AND CORONARY ANGIOGRAPHY N/A 07/25/2016   Procedure: Right/Left Heart Cath and Coronary Angiography;  Surgeon: Kathleene Hazel, MD;  Location: [facility] INVASIVE CV LAB;  Service: Cardiovascular;  Laterality: N/A;   SVT ABLATION  09/19/2016   SVT ABLATION N/A 09/19/2016   Procedure: SVT Ablation;  Surgeon: Hillis Range, MD;  Location: [facility] INVASIVE CV LAB;  Service: Cardiovascular;  Laterality: N/A;   TONSILLECTOMY AND ADENOIDECTOMY     US ECHOCARDIOGRAPHY  01/17/2003   EF 55-60%    reports that she quit smoking about 26 years ago. Her smoking use included cigarettes. She started smoking about 62 years ago. She has a 54.00 pack-year smoking  history. She has never used smokeless tobacco. She reports that she does not drink alcohol and does not use drugs. family history includes Arthritis in her mother; Heart disease in her father; Heart failure in her mother; Hypertension in her father; Kidney failure in her father. Allergies  Allergen Reactions   Oxycodone Other (See Comments)    hallucinations      Outpatient Encounter Medications as of 03/21/2021  Medication Sig   acetaminophen (TYLENOL) 500 MG tablet Take 1,000 mg by mouth 2 (two) times daily as needed for moderate pain or headache.   ALPRAZolam (XANAX) 0.25 MG tablet TAKE ONE TABLET BY MOUTH EVERY NIGHT AT BEDTIME AS NEEDED FOR ANXIETY   Ascorbic Acid (VITAMIN C PO) Take 500 mg by mouth daily. Reported on 08/08/2015   aspirin 81 MG tablet Take 81 mg by mouth at bedtime.   B Complex Vitamins (VITAMIN B COMPLEX PO) Take 1 tablet by mouth daily.   budesonide-formoterol (SYMBICORT) 80-4.5 MCG/ACT inhaler INHALE TWO PUFFS BY MOUTH TWICE A DAY *WAIT 20 MINUTES BETWEEN EACH PUFF*   calcium carbonate (OS-CAL) 600 MG tablet Take 600 mg by mouth daily.   citalopram (CELEXA) 10 MG tablet Take 1 tablet (10 mg total) by mouth daily.   diltiazem (CARDIZEM) 30 MG tablet Take 1 tablet (30 mg total) by mouth 4 (four) times daily as needed.   diltiazem (CARTIA XT) 120 MG 24 hr capsule Take 1 capsule (120 mg total) by mouth daily.   fluticasone furoate-vilanterol (BREO ELLIPTA) 100-25 MCG/INH AEPB Inhale 1 puff into the lungs daily.   furosemide (LASIX) 20 MG tablet Take 1 tablet (20 mg total) by mouth daily.   Glucosamine HCl-MSM (GLUCOSAMINE-MSM PO) Take 2 tablets by mouth daily.    megestrol (MEGACE) 40 MG tablet Take 1 tablet (40 mg total) by mouth daily.   nitroGLYCERIN (NITROSTAT) 0.4 MG SL tablet Place 1 tablet (0.4 mg total) under the tongue every 5 (five) minutes as needed for chest pain.   ondansetron (ZOFRAN) 4 MG tablet Take 1 tablet (4 mg total) by mouth every 8 (eight) hours as  needed for nausea or vomiting.   pantoprazole (PROTONIX) 40 MG tablet Take 1 tablet (40 mg total) by mouth 2 (two) times daily before a meal.   Respiratory Therapy Supplies (FLUTTER) DEVI Use as directed   simvastatin (ZOCOR) 10 MG tablet Take 1 tablet (10 mg total) by mouth daily. (Patient taking differently: Take 10 mg by mouth daily at 6 PM.)   traMADol (ULTRAM) 50 MG tablet TAKE ONE TABLET BY MOUTH TWICE A DAY AS NEEDED   triamcinolone cream (KENALOG) 0.1 % Apply 1 application topically daily as needed (hives). Apply to affected area   vitamin E 200 UNIT capsule Take 200 Units by mouth daily.   No facility-administered encounter medications on file as of 03/21/2021.  REVIEW OF SYSTEMS  : All other systems reviewed and negative except where noted in the History of Present Illness.  PHYSICAL EXAM: BP 102/78   Pulse 72   Ht 5\' 3"  (1.6 m)   Wt 105 lb (47.6 kg)   LMP  (LMP Unknown)   SpO2 96%   BMI 18.60 kg/m  General: Well developed white female in no acute distress Head: Normocephalic and atraumatic Eyes:  Sclerae anicteric, conjunctiva pink. Ears: Normal auditory acuity Lungs: Clear throughout to auscultation; no W/R/R. Heart: Regular rate and rhythm; no M/R/G. Abdomen: Soft, non-distended.  BS present.  Non-tender. Musculoskeletal: Symmetrical with no gross deformities  Skin: No lesions on visible extremities Extremities: No edema  Neurological: Alert oriented x 4, grossly non-focal Psychological:  Alert and cooperative. Normal mood and affect  ASSESSMENT AND PLAN: *87 year old female with complaints of diarrhea, nausea, loss of weight.  Her PPI was changed from omeprazole 40 mg daily to pantoprazole 40 mg BID back in June to see if that would help with the nausea and suspected reflux symptoms.  I wonder if that is causing her diarrhea as the diarrhea began within a month or two of making that medication change.  She reports feeling worse when she is anxious.  Actually  reports that the symptoms have abated some over the past week or so.  She has lost about 20-25 pounds over the past couple of months.  Will plan for CT scan of the abdomen and pelvis with contrast to rule out any major GI findings as a less invasive start to her evaluation.  Will check a BMP today.  I am going to have her go back to her omeprazole 40 mg once daily instead of the pantoprazole.  CC:  July, MD

## 2021-03-22 ENCOUNTER — Telehealth: Payer: Self-pay | Admitting: Gastroenterology

## 2021-03-22 NOTE — Telephone Encounter (Signed)
Inbound call from patient states she was told to start taking Prilosec 20mg  and once she got home she noticed the prescription she already had was 40mg  capsule. States she would need a proscription for 20mg  unless 40mg  is okay to take. Patient would like a call back [phone]

## 2021-03-22 NOTE — Telephone Encounter (Signed)
Informed patient and she will continue the Omeprazole that was previously prescribed.

## 2021-03-22 NOTE — Telephone Encounter (Signed)
Please advise 

## 2021-03-27 ENCOUNTER — Ambulatory Visit (HOSPITAL_COMMUNITY)
Admission: RE | Admit: 2021-03-27 | Discharge: 2021-03-27 | Disposition: A | Discharge status: Home / Self Care | Payer: Medicare Other | Source: Ambulatory Visit | Attending: Gastroenterology | Admitting: Gastroenterology

## 2021-03-27 ENCOUNTER — Other Ambulatory Visit: Payer: Self-pay

## 2021-03-27 DIAGNOSIS — R110 Nausea: Secondary | ICD-10-CM | POA: Diagnosis not present

## 2021-03-27 DIAGNOSIS — R197 Diarrhea, unspecified: Secondary | ICD-10-CM | POA: Insufficient documentation

## 2021-03-27 DIAGNOSIS — R634 Abnormal weight loss: Secondary | ICD-10-CM | POA: Diagnosis not present

## 2021-03-27 DIAGNOSIS — I358 Other nonrheumatic aortic valve disorders: Secondary | ICD-10-CM | POA: Diagnosis not present

## 2021-03-27 MED ORDER — IOHEXOL 350 MG/ML SOLN
80.0000 mL | Freq: Once | INTRAVENOUS | Status: AC | PRN
  Administered 2021-03-27: 80 mL via INTRAVENOUS

## 2021-03-28 ENCOUNTER — Encounter: Payer: Self-pay | Admitting: Gastroenterology

## 2021-03-28 DIAGNOSIS — R197 Diarrhea, unspecified: Secondary | ICD-10-CM | POA: Insufficient documentation

## 2021-03-28 NOTE — Progress Notes (Signed)
Assessment and plan noted ?

## 2021-04-10 ENCOUNTER — Other Ambulatory Visit: Payer: Self-pay | Admitting: Internal Medicine

## 2021-04-10 ENCOUNTER — Telehealth: Payer: Self-pay | Admitting: Internal Medicine

## 2021-04-10 NOTE — Telephone Encounter (Signed)
Left message for patient to call me back at [phone] to schedule Medicare Annual Wellness Visit   Last AWV  12/31/19  Please schedule at anytime with [facility]-Nurse Health Advisor if patient calls the office back.    40 Minutes appointment   Any questions, please call me at [phone]  

## 2021-04-11 DIAGNOSIS — H353233 Exudative age-related macular degeneration, bilateral, with inactive scar: Secondary | ICD-10-CM | POA: Diagnosis not present

## 2021-04-30 ENCOUNTER — Other Ambulatory Visit: Payer: Self-pay

## 2021-04-30 ENCOUNTER — Ambulatory Visit (INDEPENDENT_AMBULATORY_CARE_PROVIDER_SITE_OTHER): Payer: Medicare Other | Admitting: Pulmonary Disease

## 2021-04-30 ENCOUNTER — Encounter: Payer: Self-pay | Admitting: Pulmonary Disease

## 2021-04-30 ENCOUNTER — Ambulatory Visit: Payer: Medicare Other | Admitting: Pulmonary Disease

## 2021-04-30 VITALS — BP 126/54 | HR 86 | Temp 98.1°F | Ht 62.0 in | Wt 102.6 lb

## 2021-04-30 DIAGNOSIS — J479 Bronchiectasis, uncomplicated: Secondary | ICD-10-CM | POA: Diagnosis not present

## 2021-04-30 DIAGNOSIS — J849 Interstitial pulmonary disease, unspecified: Secondary | ICD-10-CM | POA: Diagnosis not present

## 2021-04-30 DIAGNOSIS — J449 Chronic obstructive pulmonary disease, unspecified: Secondary | ICD-10-CM

## 2021-04-30 MED ORDER — BUDESONIDE-FORMOTEROL FUMARATE 80-4.5 MCG/ACT IN AERO: INHALATION_SPRAY | RESPIRATORY_TRACT | 5 refills | Status: DC

## 2021-04-30 MED ORDER — ALBUTEROL SULFATE HFA 108 (90 BASE) MCG/ACT IN AERS: 2.0000 | INHALATION_SPRAY | Freq: Four times a day (QID) | RESPIRATORY_TRACT | 6 refills | Status: DC | PRN

## 2021-04-30 NOTE — Progress Notes (Signed)
Synopsis: Referred in February 2021 for bronchiectasis by Corwin Levins, MD  Subjective:   PATIENT ID: Alexandra Reyes GENDER: female DOB: April 20, 1926, MRN: [id number]  Chief Complaint  Patient presents with   Follow-up    This is a 87 year old female history of hypertension reflux COPD.  Followed in the pulmonary clinic for bronchiectasis last seen in the clinic November 2020 by [name], NP.  Patient is a former patient of Dr. Kendrick Fries.  Has had some chest pains treated with nitroglycerin history of coronary disease.  Currently compliant with use of flutter valve Symbicort plus Mucinex.  Latest pulmonary function test completed in March 2020 prior CT imaging of the chest in 2017, also CT imaging July 2020 bilateral lower lobe bronchiectasis..  Rheumatologic labs in 2017 were negative.  Doing okay with full airway clearance devices.  Breathing somewhat stable.  Occupation: Patient is from [place].  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  She came to [facility] in the late 1940s early 1950s as disaster relief from the [employer].  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  And routinely managed patients on iron lung.  She stated "the biggest problem where the doctors, they would come in on Sunday after church to visit and not wash their hands."  OV 08/04/2019: She has been doing ok with her breathing. She has been having pains in her chest but this is better now on gabapentin. She uses her flutter valve at least twice per days.  She has been using this and does feel like she brings up some sputum.  He does not always feel like she is able to clear her airways.  She has lived by herself for the past 25 years after the death of her husband from lung cancer.    OV 08/23/2020: Here today for follow-up.  Overall breathing well.  She still complains of intermittent chest pains around the inferior portion of the thoracic cage.  She sees  PM&R as well as primary care for this.  Pain is controlled with Tylenol and gabapentin.  She does feel sleepy during the day.  She does not usually get in the bed till after 11 PM.  She is not limited at this time by any respiratory issues.  Uses her flutter valve and as needed Mucinex.  OV 04/30/2021: Here today for follow-up.  She is breathing okay.  Still has occasional chest pains.  Sounds like she did have a pretty eventful year after falling in March.  We saw her in the middle of March and unfortunately few weeks later she felt.  She had to be had a cervical neck brace as well as a shoulder surgery later.  Overall from respiratory standpoint she is doing okay.  Currently managing with her current inhaler regimen.   Past Medical History:  Diagnosis Date   Age-related macular degeneration, wet, both eyes (HCC)    Alcohol dependence in remission (HCC) 05/22/2011   Allergic rhinitis, cause unspecified 05/22/2011   Allergy    Anemia, iron deficiency 05/18/2011   Cholelithiasis    COPD (chronic obstructive pulmonary disease) (HCC)    DDD (degenerative disc disease), lumbar 05/18/2011   Depression    "@ times" (09/19/2016)   First degree AV block    GERD (gastroesophageal reflux disease)    Hiatal hernia    History of blood transfusion    "3 or 4 related to childbirth"   HTN (hypertension)  05/18/2011   pt denies this hx on 09/19/2016   Hyperlipidemia 05/18/2011   Macular degeneration    Myocardial infarction [facility])    "EKG shows I've had 2; date unknown" (09/19/2016)   OA (osteoarthritis)    "a little here and there; mainly in the back" (09/19/2016)   Osteoporosis 05/18/2011   PAT (paroxysmal atrial tachycardia) (HCC)    Pectus excavatum 05/18/2011   Personal history of colonic polyps 10/28/2012   Pneumonia    "as a child"   SVT (supraventricular tachycardia) ([place])    Urinary frequency      Family History  Problem Relation Age of Onset   Heart failure Mother    Arthritis Mother     Kidney failure Father    Heart disease Father    Hypertension Father      Past Surgical History:  Procedure Laterality Date   ABDOMINAL HYSTERECTOMY  1988   BREAST BIOPSY Right 1948   "it was ok"   CARDIAC CATHETERIZATION     CARDIOVASCULAR STRESS TEST  03/08/2009   EF 84%   CATARACT EXTRACTION W/ INTRAOCULAR LENS  IMPLANT, BILATERAL Bilateral    INGUINAL HERNIA REPAIR Right 06/23/2017   Procedure: REPAIR INCARCERATED  RIGHT FEMORAL HERNIA WITH MESH;  Surgeon: Erroll Luna, MD;  Location: [place];  Service: General;  Laterality: Right;   KNEE ARTHROSCOPY Right    REVERSE SHOULDER ARTHROPLASTY Left 09/05/2020   Procedure: REVERSE SHOULDER ARTHROPLASTY;  Surgeon: Nicholes Stairs, MD;  Location: [place];  Service: Orthopedics;  Laterality: Left;   RIGHT/LEFT HEART CATH AND CORONARY ANGIOGRAPHY N/A 07/25/2016   Procedure: Right/Left Heart Cath and Coronary Angiography;  Surgeon: Burnell Blanks, MD;  Location: [place] [facility];  Service: Cardiovascular;  Laterality: N/A;   SVT ABLATION  09/19/2016   SVT ABLATION N/A 09/19/2016   Procedure: SVT Ablation;  Surgeon: Thompson Grayer, MD;  Location: [place] [facility];  Service: Cardiovascular;  Laterality: N/A;   TONSILLECTOMY AND ADENOIDECTOMY     US ECHOCARDIOGRAPHY  01/17/2003   EF 55-60%    Social History   Socioeconomic History   Marital status: Widowed    Spouse name: Not on file   Number of children: 2   Years of education: 47   Highest education level: Not on file  Occupational History   Occupation: Retired Equities trader  Tobacco Use   Smoking status: Former    Packs/day: 1.50    Years: 36.00    Pack years: 54.00    Types: Cigarettes    Start date: 03/01/1959    Quit date: 06/10/1994    Years since quitting: 26.9   Smokeless tobacco: Never  Vaping Use   Vaping Use: Never used  Substance and Sexual Activity   Alcohol use: No   Drug use: No   Sexual activity: Never  Other Topics Concern   Not on file  Social  History Narrative   Originally from [place], [place]. She moved to [place] in 1950 during the polio epidemic. Previously worked as a Marine scientist. No pets currently. Remote bird exposure. No mold or hot tub exposure.    Social Determinants of Health   Financial Resource Strain: Not on file  Food Insecurity: Not on file  Transportation Needs: Not on file  Physical Activity: Not on file  Stress: Not on file  Social Connections: Not on file  Intimate Partner Violence: Not on file     Allergies  Allergen Reactions   Oxycodone Other (See Comments)  hallucinations     Outpatient Medications Prior to Visit  Medication Sig Dispense Refill   acetaminophen (TYLENOL) 500 MG tablet Take 1,000 mg by mouth 2 (two) times daily as needed for moderate pain or headache.     ALPRAZolam (XANAX) 0.25 MG tablet TAKE ONE TABLET BY MOUTH EVERY NIGHT AT BEDTIME AS NEEDED FOR ANXIETY 90 tablet 1   Ascorbic Acid (VITAMIN C PO) Take 500 mg by mouth daily. Reported on 08/08/2015     aspirin 81 MG tablet Take 81 mg by mouth at bedtime.     B Complex Vitamins (VITAMIN B COMPLEX PO) Take 1 tablet by mouth daily.     calcium carbonate (OS-CAL) 600 MG tablet Take 600 mg by mouth daily.     citalopram (CELEXA) 10 MG tablet Take 1 tablet (10 mg total) by mouth daily. 90 tablet 3   diltiazem (CARDIZEM) 30 MG tablet Take 1 tablet (30 mg total) by mouth 4 (four) times daily as needed. 60 tablet 11   diltiazem (CARTIA XT) 120 MG 24 hr capsule Take 1 capsule (120 mg total) by mouth daily. 90 capsule 3   fluticasone furoate-vilanterol (BREO ELLIPTA) 100-25 MCG/INH AEPB Inhale 1 puff into the lungs daily. 60 each 2   furosemide (LASIX) 20 MG tablet Take 1 tablet (20 mg total) by mouth daily. 90 tablet 3   Glucosamine HCl-MSM (GLUCOSAMINE-MSM PO) Take 2 tablets by mouth daily.      megestrol (MEGACE) 40 MG tablet Take 1 tablet (40 mg total) by mouth daily. 90 tablet 1   nitroGLYCERIN (NITROSTAT) 0.4 MG SL tablet Place 1 tablet (0.4 mg  total) under the tongue every 5 (five) minutes as needed for chest pain. 25 tablet 3   omeprazole (PRILOSEC) 40 MG capsule TAKE ONE CAPSULE BY MOUTH DAILY 90 capsule 1   ondansetron (ZOFRAN) 4 MG tablet Take 1 tablet (4 mg total) by mouth every 8 (eight) hours as needed for nausea or vomiting. 30 tablet 1   Respiratory Therapy Supplies (FLUTTER) DEVI Use as directed 1 each 0   simvastatin (ZOCOR) 10 MG tablet Take 1 tablet (10 mg total) by mouth daily. (Patient taking differently: Take 10 mg by mouth daily at 6 PM.) 90 tablet 3   traMADol (ULTRAM) 50 MG tablet TAKE ONE TABLET BY MOUTH TWICE A DAY AS NEEDED 60 tablet 2   triamcinolone cream (KENALOG) 0.1 % Apply 1 application topically daily as needed (hives). Apply to affected area 30 g 0   vitamin E 200 UNIT capsule Take 200 Units by mouth daily.     pantoprazole (PROTONIX) 40 MG tablet Take 1 tablet (40 mg total) by mouth 2 (two) times daily before a meal. (Patient not taking: Reported on 04/30/2021) 60 tablet 5   budesonide-formoterol (SYMBICORT) 80-4.5 MCG/ACT inhaler INHALE TWO PUFFS BY MOUTH TWICE A DAY *WAIT 20 MINUTES BETWEEN EACH PUFF* (Patient not taking: Reported on 04/30/2021) 10.2 g 5   No facility-administered medications prior to visit.    Review of Systems  Constitutional:  Negative for chills, fever, malaise/fatigue and weight loss.  HENT:  Negative for hearing loss, sore throat and tinnitus.   Eyes:  Negative for blurred vision and double vision.  Respiratory:  Negative for cough, hemoptysis, sputum production, shortness of breath, wheezing and stridor.   Cardiovascular:  Negative for chest pain, palpitations, orthopnea, leg swelling and PND.  Gastrointestinal:  Negative for abdominal pain, constipation, diarrhea, heartburn, nausea and vomiting.  Genitourinary:  Negative for dysuria, hematuria and urgency.  Musculoskeletal:  Positive for joint pain. Negative for myalgias.  Skin:  Negative for itching and rash.  Neurological:   Negative for dizziness, tingling, weakness and headaches.  Endo/Heme/Allergies:  Negative for environmental allergies. Does not bruise/bleed easily.  Psychiatric/Behavioral:  Negative for depression. The patient is not nervous/anxious and does not have insomnia.   All other systems reviewed and are negative.   Objective:  Physical Exam Vitals reviewed.  Constitutional:      General: She is not in acute distress.    Appearance: She is well-developed.     Comments: Thin elderly female  HENT:     Head: Normocephalic and atraumatic.  Eyes:     General: No scleral icterus.    Conjunctiva/sclera: Conjunctivae normal.     Pupils: Pupils are equal, round, and reactive to light.  Neck:     Vascular: No JVD.     Trachea: No tracheal deviation.  Cardiovascular:     Rate and Rhythm: Normal rate and regular rhythm.     Heart sounds: Normal heart sounds. No murmur heard. Pulmonary:     Effort: Pulmonary effort is normal. No tachypnea, accessory muscle usage or respiratory distress.     Breath sounds: Normal breath sounds. No stridor. No wheezing, rhonchi or rales.  Abdominal:     General: There is no distension.     Palpations: Abdomen is soft.     Tenderness: There is no abdominal tenderness.  Musculoskeletal:        General: No tenderness.     Cervical back: Neck supple.  Lymphadenopathy:     Cervical: No cervical adenopathy.  Skin:    General: Skin is warm and dry.     Capillary Refill: Capillary refill takes less than 2 seconds.     Findings: No rash.  Neurological:     Mental Status: She is alert and oriented to person, place, and time.  Psychiatric:        Behavior: Behavior normal.     Vitals:   04/30/21 1500  BP: (!) 126/54  Pulse: 86  Temp: 98.1 F (36.7 C)  TempSrc: Oral  SpO2: 98%  Weight: 102 lb 9.6 oz (46.5 kg)  Height: 5\' 2"  (1.575 m)   98% on RA BMI Readings from Last 3 Encounters:  04/30/21 18.77 kg/m  03/21/21 18.60 kg/m  03/01/21 18.42 kg/m    Wt Readings from Last 3 Encounters:  04/30/21 102 lb 9.6 oz (46.5 kg)  03/21/21 105 lb (47.6 kg)  03/01/21 104 lb (47.2 kg)     CBC    Component Value Date/Time   WBC 8.3 11/14/2020 1601   RBC 5.20 (H) 11/14/2020 1601   HGB 14.6 11/14/2020 1601   HGB 13.9 07/17/2016 1029   HCT 44.9 11/14/2020 1601   HCT 41.7 07/17/2016 1029   PLT 423.0 (H) 11/14/2020 1601   PLT 387 (H) 07/17/2016 1029   MCV 86.3 11/14/2020 1601   MCV 89 07/17/2016 1029   MCH 29.8 09/07/2020 0729   MCHC 32.5 11/14/2020 1601   RDW 15.9 (H) 11/14/2020 1601   RDW 14.7 07/17/2016 1029   LYMPHSABS 0.7 11/14/2020 1601   LYMPHSABS 1.3 07/17/2016 1029   MONOABS 0.8 11/14/2020 1601   EOSABS 0.1 11/14/2020 1601   EOSABS 0.2 07/17/2016 1029   BASOSABS 0.1 11/14/2020 1601   BASOSABS 0.1 07/17/2016 1029    Chest Imaging: July 2020 CT chest bilateral lower lobe bronchiectasis.  Pulmonary Functions Testing Results: PFT Results Latest Ref Rng & Units 08/26/2018 07/12/2015 04/28/2013  FVC-Pre L 2.27 2.29 2.34  FVC-Predicted Pre % 112 106 103  FVC-Post L 2.29 2.39 2.33  FVC-Predicted Post % 113 110 103  Pre FEV1/FVC % % 75 68 69  Post FEV1/FCV % % 76 74 70  FEV1-Pre L 1.70 1.57 1.63  FEV1-Predicted Pre % 114 99 97  FEV1-Post L 1.74 1.76 1.63  DLCO uncorrected ml/min/mmHg 12.50 12.09 12.65  DLCO UNC% % 71 52 55  DLVA Predicted % 89 70 67  TLC L 4.86 - 4.51  TLC % Predicted % 99 - 92  RV % Predicted % 107 - 76    Assessment & Plan:     ICD-10-CM   1. Bronchiectasis without acute exacerbation ([place])  J47.9     2. Chronic obstructive pulmonary disease, unspecified COPD type ([place])  J44.9     3. Bronchiectasis without complication ([place])  A999333     4. ILD (interstitial lung disease) ([place])  J84.9       Assessment:   This is a 87 year old female, chronic bronchiectasis, COPD, ILD.  Chest pains have resolved since her last office visit.  They do sometimes appears sporadically.  She is slowly starting to  recover after a eventful year following a fall in March.  She uses her flutter valve to help mobilize secretions.  Unable to do CPT by herself.  Unable due to the vest after recently having shoulder surgery.  Plan: Continue Symbicort Continue airway clearance techniques follow up with [place] in 1 year or as needed.    Current Outpatient Medications:    acetaminophen (TYLENOL) 500 MG tablet, Take 1,000 mg by mouth 2 (two) times daily as needed for moderate pain or headache., Disp: , Rfl:    albuterol (VENTOLIN HFA) 108 (90 Base) MCG/ACT inhaler, Inhale 2 puffs into the lungs every 6 (six) hours as needed for wheezing or shortness of breath., Disp: 8 g, Rfl: 6   ALPRAZolam (XANAX) 0.25 MG tablet, TAKE ONE TABLET BY MOUTH EVERY NIGHT AT BEDTIME AS NEEDED FOR ANXIETY, Disp: 90 tablet, Rfl: 1   Ascorbic Acid (VITAMIN C PO), Take 500 mg by mouth daily. Reported on 08/08/2015, Disp: , Rfl:    aspirin 81 MG tablet, Take 81 mg by mouth at bedtime., Disp: , Rfl:    B Complex Vitamins (VITAMIN B COMPLEX PO), Take 1 tablet by mouth daily., Disp: , Rfl:    calcium carbonate (OS-CAL) 600 MG tablet, Take 600 mg by mouth daily., Disp: , Rfl:    citalopram (CELEXA) 10 MG tablet, Take 1 tablet (10 mg total) by mouth daily., Disp: 90 tablet, Rfl: 3   diltiazem (CARDIZEM) 30 MG tablet, Take 1 tablet (30 mg total) by mouth 4 (four) times daily as needed., Disp: 60 tablet, Rfl: 11   diltiazem (CARTIA XT) 120 MG 24 hr capsule, Take 1 capsule (120 mg total) by mouth daily., Disp: 90 capsule, Rfl: 3   fluticasone furoate-vilanterol (BREO ELLIPTA) 100-25 MCG/INH AEPB, Inhale 1 puff into the lungs daily., Disp: 60 each, Rfl: 2   furosemide (LASIX) 20 MG tablet, Take 1 tablet (20 mg total) by mouth daily., Disp: 90 tablet, Rfl: 3   Glucosamine HCl-MSM (GLUCOSAMINE-MSM PO), Take 2 tablets by mouth daily. , Disp: , Rfl:    megestrol (MEGACE) 40 MG tablet, Take 1 tablet (40 mg total) by mouth daily., Disp: 90 tablet, Rfl: 1    nitroGLYCERIN (NITROSTAT) 0.4 MG SL tablet, Place 1 tablet (0.4 mg total) under the tongue every 5 (five) minutes as needed  for chest pain., Disp: 25 tablet, Rfl: 3   omeprazole (PRILOSEC) 40 MG capsule, TAKE ONE CAPSULE BY MOUTH DAILY, Disp: 90 capsule, Rfl: 1   ondansetron (ZOFRAN) 4 MG tablet, Take 1 tablet (4 mg total) by mouth every 8 (eight) hours as needed for nausea or vomiting., Disp: 30 tablet, Rfl: 1   Respiratory Therapy Supplies (FLUTTER) DEVI, Use as directed, Disp: 1 each, Rfl: 0   simvastatin (ZOCOR) 10 MG tablet, Take 1 tablet (10 mg total) by mouth daily. (Patient taking differently: Take 10 mg by mouth daily at 6 PM.), Disp: 90 tablet, Rfl: 3   traMADol (ULTRAM) 50 MG tablet, TAKE ONE TABLET BY MOUTH TWICE A DAY AS NEEDED, Disp: 60 tablet, Rfl: 2   triamcinolone cream (KENALOG) 0.1 %, Apply 1 application topically daily as needed (hives). Apply to affected area, Disp: 30 g, Rfl: 0   vitamin E 200 UNIT capsule, Take 200 Units by mouth daily., Disp: , Rfl:    pantoprazole (PROTONIX) 40 MG tablet, Take 1 tablet (40 mg total) by mouth 2 (two) times daily before a meal. (Patient not taking: Reported on 04/30/2021), Disp: 60 tablet, Rfl: 5   Garner Nash, DO [facility] 04/30/2021 5:23 PM

## 2021-04-30 NOTE — Patient Instructions (Addendum)
Thank you for visiting Dr. Tonia Brooms at [facility]. Today we recommend the following:  Continue breo inhaler  Continue albuterol as needed   Return in about 1 year (around 04/30/2022) for with APP or Dr. Tonia Brooms.    Please do your part to reduce the spread of COVID-19.

## 2021-05-07 ENCOUNTER — Encounter: Payer: Self-pay | Admitting: Cardiology

## 2021-05-07 ENCOUNTER — Other Ambulatory Visit: Payer: Self-pay | Admitting: Internal Medicine

## 2021-05-07 ENCOUNTER — Telehealth: Payer: Self-pay | Admitting: Internal Medicine

## 2021-05-07 ENCOUNTER — Other Ambulatory Visit (INDEPENDENT_AMBULATORY_CARE_PROVIDER_SITE_OTHER): Payer: Medicare Other

## 2021-05-07 ENCOUNTER — Ambulatory Visit (INDEPENDENT_AMBULATORY_CARE_PROVIDER_SITE_OTHER): Payer: Medicare Other | Admitting: Cardiology

## 2021-05-07 ENCOUNTER — Other Ambulatory Visit: Payer: Self-pay

## 2021-05-07 ENCOUNTER — Encounter: Payer: Self-pay | Admitting: Internal Medicine

## 2021-05-07 VITALS — BP 122/64 | HR 83 | Ht 62.0 in | Wt 104.0 lb

## 2021-05-07 DIAGNOSIS — I2510 Atherosclerotic heart disease of native coronary artery without angina pectoris: Secondary | ICD-10-CM | POA: Diagnosis not present

## 2021-05-07 DIAGNOSIS — I471 Supraventricular tachycardia, unspecified: Secondary | ICD-10-CM

## 2021-05-07 DIAGNOSIS — R300 Dysuria: Secondary | ICD-10-CM

## 2021-05-07 DIAGNOSIS — Z23 Encounter for immunization: Secondary | ICD-10-CM | POA: Diagnosis not present

## 2021-05-07 DIAGNOSIS — I700 Atherosclerosis of aorta: Secondary | ICD-10-CM

## 2021-05-07 DIAGNOSIS — J479 Bronchiectasis, uncomplicated: Secondary | ICD-10-CM | POA: Diagnosis not present

## 2021-05-07 LAB — URINALYSIS, ROUTINE W REFLEX MICROSCOPIC
Bilirubin Urine: NEGATIVE
Hgb urine dipstick: NEGATIVE
Ketones, ur: NEGATIVE
Nitrite: NEGATIVE
Specific Gravity, Urine: 1.01 (ref 1.000–1.030)
Total Protein, Urine: NEGATIVE
Urine Glucose: NEGATIVE
Urobilinogen, UA: 0.2 (ref 0.0–1.0)
pH: 6 (ref 5.0–8.0)

## 2021-05-07 MED ORDER — CEPHALEXIN 500 MG PO CAPS: 500.0000 mg | ORAL_CAPSULE | Freq: Three times a day (TID) | ORAL | 0 refills | Status: AC

## 2021-05-07 NOTE — Assessment & Plan Note (Signed)
Dr. Tonia Brooms has been seeing.  Interstitial fibrosis seen on CT.

## 2021-05-07 NOTE — Telephone Encounter (Signed)
Patient states she has a possible bladder infection, patient is requesting to drop off a urine sample today  Patient states today is the only day she will have a ride to drop off sample  Please call patient before 1pm today if possible

## 2021-05-07 NOTE — Assessment & Plan Note (Signed)
Continue with simvastatin 20 mg aspirin 81.

## 2021-05-07 NOTE — Assessment & Plan Note (Signed)
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  She takes Cardizem CD1 120 mg once a day.  Has been doing quite well.  She did have an episode of arrhythmia few weeks back, took the 30 mg as needed dosage.  Improved.

## 2021-05-07 NOTE — Assessment & Plan Note (Signed)
Occluded RCA with collaterals from left to right.  No angina

## 2021-05-07 NOTE — Patient Instructions (Signed)

## 2021-05-07 NOTE — Progress Notes (Signed)
Cardiology Office Note:    Date:  05/07/2021   ID:  [name], [name] 05/11/1926, MRN [id number]  PCP:  Biagio Borg, MD  [facility] Cardiologist:  Candee Furbish, MD  [facility] Electrophysiologist:  Thompson Grayer, MD   Referring MD: Biagio Borg, MD   History of Present Illness:    Alexandra Reyes is a 87 y.o. female here for the follow-up of coronary artery disease, hypertension, and hyperlipidemia. SVT ablation performed 09/2016.  She followed up with Ermalinda Barrios, PA-C on 02/06/2021. Blood pressure was 136/74. Her PCP discontinued amlodipine, and for a week she experienced racing heart beats and high diastolic BP. She had been wearing compression hose and her swelling had improved. She reported unchanged chronic chest pain and dyspnea.  Last saw Dr. Rayann Heman on 12/13/2020. Her blood pressure was 142/74. She reported a fall in 08/2020 resulting in a shoulder injury that required surgery. She was not feeling well and was losing weight, had a poor appetite. Also had recently fallen to the floor due to weakness. Her venous insufficiency was noted to be worse without wearing compression hose. EKG showed sinus rhythm with incomplete LBBB. Lasix was reduced to 20 mg daily.  Saw Dr. Rayann Heman on 12/01/2019, office note reviewed.  She was doing well at that time.  Chest pain was atypical and symptoms were stable.  No symptoms of palpitations.  Came to [place] from [place] to help treat the polio epidemic. Went to [employer] in [place].  At her last appointment she had been doing quite well.  She likes to stay on the Cardizem.  When she came off of it at 1 point she started to have tachy arrhythmias again.  Today: She is accompanied by her friend Alexandra Reyes. Overall, she appears well. She reports that she has recovered well from her shoulder arthroplasty. Since discontinuing amlodipine, she denies having any more syncopal episodes.  Her main ongoing concerns have been  intermittent diarrhea and nausea with subsequent loss of appetite and weight loss. She reports that every time her appetite improves, she suffers from recurring diarrhea and nausea which has caused her weight loss over the past months. Lately she has been drinking Ensure Boost protein drinks, which seems to be helping her.  She is followed by pulmonology concerning her bronchiectasis and COPD.  She denies any palpitations, or chest pain. No lightheadedness, headaches, orthopnea, PND, lower extremity edema or exertional symptoms.  Unfortunately, her brother passed away recently.  Past Medical History:  Diagnosis Date   Age-related macular degeneration, wet, both eyes (HCC)    Alcohol dependence in remission ([place]) 05/22/2011   Allergic rhinitis, cause unspecified 05/22/2011   Allergy    Anemia, iron deficiency 05/18/2011   Cholelithiasis    COPD (chronic obstructive pulmonary disease) (HCC)    DDD (degenerative disc disease), lumbar 05/18/2011   Depression    "@ times" (09/19/2016)   First degree AV block    GERD (gastroesophageal reflux disease)    Hiatal hernia    History of blood transfusion    "3 or 4 related to childbirth"   HTN (hypertension) 05/18/2011   pt denies this hx on 09/19/2016   Hyperlipidemia 05/18/2011   Macular degeneration    Myocardial infarction ([place])    "EKG shows I've had 2; date unknown" (09/19/2016)   OA (osteoarthritis)    "a little here and there; mainly in the back" (09/19/2016)   Osteoporosis 05/18/2011   PAT (paroxysmal atrial tachycardia) (  [place])    Pectus excavatum 05/18/2011   Personal history of colonic polyps 10/28/2012   Pneumonia    "as a child"   SVT (supraventricular tachycardia) ([place])    Urinary frequency     Past Surgical History:  Procedure Laterality Date   ABDOMINAL HYSTERECTOMY  1988   BREAST BIOPSY Right 1948   "it was ok"   CARDIAC CATHETERIZATION     CARDIOVASCULAR STRESS TEST  03/08/2009   EF 84%   CATARACT EXTRACTION W/ INTRAOCULAR  LENS  IMPLANT, BILATERAL Bilateral    INGUINAL HERNIA REPAIR Right 06/23/2017   Procedure: REPAIR INCARCERATED  RIGHT FEMORAL HERNIA WITH MESH;  Surgeon: Erroll Luna, MD;  Location: [facility];  Service: General;  Laterality: Right;   KNEE ARTHROSCOPY Right    REVERSE SHOULDER ARTHROPLASTY Left 09/05/2020   Procedure: REVERSE SHOULDER ARTHROPLASTY;  Surgeon: Nicholes Stairs, MD;  Location: [facility];  Service: Orthopedics;  Laterality: Left;   RIGHT/LEFT HEART CATH AND CORONARY ANGIOGRAPHY N/A 07/25/2016   Procedure: Right/Left Heart Cath and Coronary Angiography;  Surgeon: Burnell Blanks, MD;  Location: [facility];  Service: Cardiovascular;  Laterality: N/A;   SVT ABLATION  09/19/2016   SVT ABLATION N/A 09/19/2016   Procedure: SVT Ablation;  Surgeon: Thompson Grayer, MD;  Location: [facility];  Service: Cardiovascular;  Laterality: N/A;   TONSILLECTOMY AND ADENOIDECTOMY     US ECHOCARDIOGRAPHY  01/17/2003   EF 55-60%    Current Medications: Current Meds  Medication Sig   acetaminophen (TYLENOL) 500 MG tablet Take 1,000 mg by mouth 2 (two) times daily as needed for moderate pain or headache.   albuterol (VENTOLIN HFA) 108 (90 Base) MCG/ACT inhaler Inhale 2 puffs into the lungs every 6 (six) hours as needed for wheezing or shortness of breath.   ALPRAZolam (XANAX) 0.25 MG tablet TAKE ONE TABLET BY MOUTH EVERY NIGHT AT BEDTIME AS NEEDED FOR ANXIETY   Ascorbic Acid (VITAMIN C PO) Take 500 mg by mouth daily. Reported on 08/08/2015   aspirin 81 MG tablet Take 81 mg by mouth at bedtime.   B Complex Vitamins (VITAMIN B COMPLEX PO) Take 1 tablet by mouth daily.   calcium carbonate (OS-CAL) 600 MG tablet Take 600 mg by mouth daily.   citalopram (CELEXA) 10 MG tablet Take 1 tablet (10 mg total) by mouth daily.   diltiazem (CARDIZEM) 30 MG tablet Take 1 tablet (30 mg total) by mouth 4 (four) times daily as needed.   diltiazem (CARTIA XT) 120 MG 24 hr capsule Take 1 capsule (120 mg  total) by mouth daily.   fluticasone furoate-vilanterol (BREO ELLIPTA) 100-25 MCG/INH AEPB Inhale 1 puff into the lungs daily.   furosemide (LASIX) 20 MG tablet Take 1 tablet (20 mg total) by mouth daily.   Glucosamine HCl-MSM (GLUCOSAMINE-MSM PO) Take 2 tablets by mouth daily.    megestrol (MEGACE) 40 MG tablet Take 1 tablet (40 mg total) by mouth daily.   nitroGLYCERIN (NITROSTAT) 0.4 MG SL tablet Place 1 tablet (0.4 mg total) under the tongue every 5 (five) minutes as needed for chest pain.   omeprazole (PRILOSEC) 40 MG capsule TAKE ONE CAPSULE BY MOUTH DAILY   ondansetron (ZOFRAN) 4 MG tablet Take 1 tablet (4 mg total) by mouth every 8 (eight) hours as needed for nausea or vomiting.   Respiratory Therapy Supplies (FLUTTER) DEVI Use as directed   simvastatin (ZOCOR) 10 MG tablet Take 1 tablet (10 mg total) by mouth daily. (Patient taking differently: Take  10 mg by mouth daily at 6 PM.)   traMADol (ULTRAM) 50 MG tablet TAKE ONE TABLET BY MOUTH TWICE A DAY AS NEEDED   triamcinolone cream (KENALOG) 0.1 % Apply 1 application topically daily as needed (hives). Apply to affected area   vitamin E 200 UNIT capsule Take 200 Units by mouth daily.     Allergies:   Oxycodone   Social History   Socioeconomic History   Marital status: Widowed    Spouse name: Not on file   Number of children: 2   Years of education: 85   Highest education level: Not on file  Occupational History   Occupation: Retired Designer, jewellery  Tobacco Use   Smoking status: Former    Packs/day: 1.50    Years: 36.00    Pack years: 54.00    Types: Cigarettes    Start date: 03/01/1959    Quit date: 06/10/1994    Years since quitting: 26.9   Smokeless tobacco: Never  Vaping Use   Vaping Use: Never used  Substance and Sexual Activity   Alcohol use: No   Drug use: No   Sexual activity: Never  Other Topics Concern   Not on file  Social History Narrative   Originally from [place], [place]. She moved to [place] in 1950 during  the polio epidemic. 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 Remote bird exposure. No mold or hot tub exposure.    Social Determinants of Health   Financial Resource Strain: Not on file  Food Insecurity: Not on file  Transportation Needs: Not on file  Physical Activity: Not on file  Stress: Not on file  Social Connections: Not on file     Family History: The patient's family history includes Arthritis in her mother; Heart disease in her father; Heart failure in her mother; Hypertension in her father; Kidney failure in her father.  ROS:   Please see the history of present illness.    (+) Nausea (+) Diarrhea (+) Loss of appetite (+) Weight loss All other systems reviewed and are negative.  EKGs/Labs/Other Studies Reviewed:    The following studies were reviewed today:  CT Abdomen/Pelvis 03/27/2021: IMPRESSION: 1. No acute findings are noted in the abdomen or pelvis to account for the patient's symptoms. 2. Small simple appearing cyst in the left adnexal region, likely an ovarian cyst. No follow-up imaging recommended. Note: This recommendation does not apply to premenarchal patients and to those with increased risk (genetic, family history, elevated tumor markers or other high-risk factors) of ovarian cancer. Reference: JACR 2020 Feb; 17(2):248-254. 3. Worsening areas of fibrosis in the lung bases concerning for progressive interstitial lung disease. Further characterization with nonemergent high-resolution chest CT is suggested in the near future. Additionally, outpatient referral to Pulmonology for further clinical evaluation is recommended. 4. There are calcifications of the aortic valve. Echocardiographic correlation for evaluation of potential valvular dysfunction may be warranted if clinically indicated. 5. Aortic atherosclerosis, in addition to at least right coronary artery disease.  Echo 01/03/2021: Sonographer Comments: Technically difficult to position  patient.  IMPRESSIONS    1. Left ventricular ejection fraction, by estimation, is 55 to 60%. The  left ventricle has normal function. The left ventricle has no regional  wall motion abnormalities. Left ventricular diastolic parameters are  consistent with Grade I diastolic  dysfunction (impaired relaxation).   2. Right ventricular systolic function is normal. The right ventricular  size is normal. There is normal pulmonary artery systolic pressure.   3. The  mitral valve is grossly normal. No evidence of mitral valve  regurgitation. No evidence of mitral stenosis.   4. The aortic valve is tricuspid. There is mild calcification of the  aortic valve. There is mild thickening of the aortic valve. Aortic valve  regurgitation is not visualized. Aortic valve sclerosis is present, with  no evidence of aortic valve stenosis.   5. Aortic dilatation noted. There is borderline dilatation of the  ascending aorta, measuring 39 mm.   Comparison(s): A prior study was performed on 07/01/2015. Prior images  unable to be directly viewed, comparison made by report only. Similar to  prior report.   CT Chest 09/03/2020: COMPARISON:  12/28/2018   FINDINGS: Cardiovascular: Thoracic aorta and its branches demonstrate diffuse atherosclerotic calcifications. No aneurysmal dilatation is identified. Coronary calcifications are seen. Pulmonary artery is not significantly enlarged.   Mediastinum/Nodes: Thoracic inlet is within normal limits with the exception of a 2 cm hypodense nodule within the right lobe of the thyroid bridging into the isthmus. No sizable hilar or mediastinal adenopathy is noted. No esophageal abnormality is seen.   Lungs/Pleura: Lungs are well aerated bilaterally. No focal infiltrate or sizable effusion is seen. Minimal right basilar atelectasis is noted. No sizable effusion or pneumothorax is seen.   Upper Abdomen: Renal cystic change is noted and stable from the prior exam. Remainder  of the upper abdomen appears within normal limits.   Musculoskeletal: Compression deformity is noted at T2 which is new from the prior exam of [id number] but of uncertain chronicity. No other compression deformities are seen. Sternum is unremarkable. Comminuted proximal left humeral fracture is again seen stable from prior plain films. No acute rib fracture is noted.   IMPRESSION: Stable left humeral fracture.   No definitive rib abnormality is seen.   Chronic appearing T2 compression fracture. This is new from prior exam of [id number] but of uncertain chronicity. Correlation to point tenderness is recommended.   2 cm hypodense nodule within the right lobe of the thyroid. This is stable from a prior exam of 2020 and given the patient's age no follow-up recommended unless clinically warranted (ref: J Am Coll Radiol. 2015 Feb;12(2): 143-50).   Aortic Atherosclerosis (ICD10-I70.0).  Monitor 02/2017: Sinus rhythm Symptomatic episodes correspond to SVT.  This is a mid RP SVT and may represent ectopic atrial tachycardia, though reentrant arrhythmia cannot be excluded.  SVT Ablation 09/19/2016: CONCLUSIONS:  1. Sinus rhythm upon presentation.  2. The patient had dual AV nodal physiology with easily inducible and incessant classic AV nodal reentrant tachycardia, there were no other accessory pathways or arrhythmias induced  3. Successful radiofrequency modification of the slow AV nodal pathway  4. No inducible arrhythmias following ablation though the patient did have multifocal premature atrial contractiosn 5. No early apparent complications.   Cath 2018: Prox RCA lesion, 100 %stenosed. Prox LAD lesion, 20 %stenosed. LV end diastolic pressure is normal.   1. 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 The mid and distal vessel fills briskly from left to right collaterals.  2. Mild non-obstructive disease in the LAD 3. Normal filling pressures.  4. Normal PA  pressures  Diagnostic Dominance: Right  Intervention    EKG: EKG is personally reviewed and interpreted. 05/07/2021: EKG was not ordered. 12/13/2020 (Dr. Rayann Heman): Sinus rhythm, incomplete LBBB.  Recent Labs: 07/05/2020: TSH 1.67 09/07/2020: Magnesium 1.9 11/14/2020: ALT 16; Hemoglobin 14.6; Platelets 423.0 03/21/2021: BUN 31; Creatinine, Ser 1.16; Potassium 4.2; Sodium 130   Recent Lipid Panel    Component Value  Date/Time   CHOL 178 07/05/2020 1356   CHOL 160 09/30/2016 1205   TRIG 108.0 07/05/2020 1356   HDL 75.60 07/05/2020 1356   HDL 73 09/30/2016 1205   CHOLHDL 2 07/05/2020 1356   VLDL 21.6 07/05/2020 1356   LDLCALC 81 07/05/2020 1356   LDLCALC 69 09/30/2016 1205   LDLDIRECT 129.2 05/15/2011 0925     Risk Assessment/Calculations:       Physical Exam:    VS:  BP 122/64   Pulse 83   Ht 5\' 2"  (1.575 m)   Wt 104 lb (47.2 kg)   LMP  (LMP Unknown)   SpO2 98%   BMI 19.02 kg/m     Wt Readings from Last 3 Encounters:  05/07/21 104 lb (47.2 kg)  04/30/21 102 lb 9.6 oz (46.5 kg)  03/21/21 105 lb (47.6 kg)     GEN:  Well nourished, well developed in no acute distress HEENT: Normal NECK: No JVD; No carotid bruits LYMPHATICS: No lymphadenopathy CARDIAC: RRR, no murmurs, rubs, gallops RESPIRATORY:  Clear to auscultation without rales, wheezing or rhonchi  ABDOMEN: Soft, non-tender, non-distended MUSCULOSKELETAL:  No edema; No deformity  SKIN: Warm and dry NEUROLOGIC:  Alert and oriented x 3 PSYCHIATRIC:  Normal affect   ASSESSMENT:    1. Paroxysmal SVT (supraventricular tachycardia) ([place])   2. Coronary artery disease involving native coronary artery of native heart without angina pectoris   3. Aortic atherosclerosis ([place])   4. Bronchiectasis without acute exacerbation (HCC)     PLAN:    In order of problems listed above:  Paroxysmal SVT (supraventricular tachycardia) (HCC) AVNRT ablation by Dr. Rayann Heman.  She takes Cardizem CD1 120 mg once a day.  Has  been doing quite well.  She did have an episode of arrhythmia few weeks back, took the 30 mg as needed dosage.  Improved.  Coronary artery disease involving native coronary artery of native heart without angina pectoris Occluded RCA with collaterals from left to right.  No angina  Aortic atherosclerosis (HCC) Continue with simvastatin 20 mg aspirin 81.  Bronchiectasis without acute exacerbation (HCC) Dr. Valeta Harms has been seeing.  Interstitial fibrosis seen on CT.   Shared Decision Making/Informed Consent      Follow-up: 6 month.  Medication Adjustments/Labs and Tests Ordered: Current medicines are reviewed at length with the patient today.  Concerns regarding medicines are outlined above.   No orders of the defined types were placed in this encounter.  No orders of the defined types were placed in this encounter.  Patient Instructions  Medication Instructions:  The current medical regimen is effective;  continue present plan and medications.  *If you need a refill on your cardiac medications before your next appointment, please call your pharmacy*  Follow-Up: At [facility], you and your health needs are our priority.  As part of our continuing mission to provide you with exceptional heart care, we have created designated Provider Care Teams.  These Care Teams include your primary Cardiologist (physician) and Advanced Practice Providers (APPs -  Physician Assistants and Nurse Practitioners) who all work together to provide you with the care you need, when you need it.  We recommend signing up for the patient portal called "MyChart".  Sign up information is provided on this After Visit Summary.  MyChart is used to connect with patients for Virtual Visits (Telemedicine).  Patients are able to view lab/test results, encounter notes, upcoming appointments, etc.  Non-urgent messages can be sent to your provider as well.  To learn more about what you can do with MyChart, go to  [email or web address].    Your next appointment:   6 month(s)  The format for your next appointment:   In Person  Provider:   Candee Furbish, MD     Thank you for choosing [facility]!!     I,Mathew Stumpf,acting as a scribe for Candee Furbish, MD.,have documented all relevant documentation on the behalf of Candee Furbish, MD,as directed by  Candee Furbish, MD while in the presence of Candee Furbish, MD.  PLAN:   Signed, Candee Furbish, MD  05/07/2021 2:21 PM    [place]

## 2021-05-08 LAB — URINE CULTURE

## 2021-05-09 ENCOUNTER — Encounter: Payer: Self-pay | Admitting: Internal Medicine

## 2021-06-20 ENCOUNTER — Telehealth: Payer: Self-pay | Admitting: Pulmonary Disease

## 2021-06-20 NOTE — Telephone Encounter (Signed)
Call made to patient, confirmed DOB. Patient states that Virgel Bouquet is now more expensive than Symbicort and she would like to switch back to Symbicort stating its more affordable.   BI please advise. Thanks :)

## 2021-06-21 NOTE — Telephone Encounter (Signed)
Called pt and there was no answer- line rings and no option to leave msg.

## 2021-06-22 NOTE — Telephone Encounter (Signed)
Called again and still no answer or option to leave msg Closing per protocol

## 2021-07-09 ENCOUNTER — Encounter: Payer: Self-pay | Admitting: Internal Medicine

## 2021-07-09 ENCOUNTER — Other Ambulatory Visit: Payer: Self-pay

## 2021-07-09 ENCOUNTER — Ambulatory Visit (INDEPENDENT_AMBULATORY_CARE_PROVIDER_SITE_OTHER): Payer: Medicare Other | Admitting: Internal Medicine

## 2021-07-09 ENCOUNTER — Other Ambulatory Visit: Payer: Self-pay | Admitting: Internal Medicine

## 2021-07-09 VITALS — BP 120/70 | HR 85 | Temp 97.6°F | Ht 62.0 in | Wt 105.0 lb

## 2021-07-09 DIAGNOSIS — R350 Frequency of micturition: Secondary | ICD-10-CM

## 2021-07-09 DIAGNOSIS — E7800 Pure hypercholesterolemia, unspecified: Secondary | ICD-10-CM | POA: Diagnosis not present

## 2021-07-09 DIAGNOSIS — N1831 Chronic kidney disease, stage 3a: Secondary | ICD-10-CM | POA: Diagnosis not present

## 2021-07-09 DIAGNOSIS — R739 Hyperglycemia, unspecified: Secondary | ICD-10-CM

## 2021-07-09 DIAGNOSIS — N179 Acute kidney failure, unspecified: Secondary | ICD-10-CM

## 2021-07-09 DIAGNOSIS — E538 Deficiency of other specified B group vitamins: Secondary | ICD-10-CM

## 2021-07-09 DIAGNOSIS — E559 Vitamin D deficiency, unspecified: Secondary | ICD-10-CM | POA: Diagnosis not present

## 2021-07-09 DIAGNOSIS — I10 Essential (primary) hypertension: Secondary | ICD-10-CM

## 2021-07-09 LAB — CBC WITH DIFFERENTIAL/PLATELET
Basophils Absolute: 0.1 10*3/uL (ref 0.0–0.1)
Basophils Relative: 0.7 % (ref 0.0–3.0)
Eosinophils Absolute: 0.1 10*3/uL (ref 0.0–0.7)
Eosinophils Relative: 1.6 % (ref 0.0–5.0)
HCT: 45.5 % (ref 36.0–46.0)
Hemoglobin: 14.8 g/dL (ref 12.0–15.0)
Lymphocytes Relative: 11.3 % — ABNORMAL LOW (ref 12.0–46.0)
Lymphs Abs: 0.9 10*3/uL (ref 0.7–4.0)
MCHC: 32.5 g/dL (ref 30.0–36.0)
MCV: 91.5 fl (ref 78.0–100.0)
Monocytes Absolute: 0.8 10*3/uL (ref 0.1–1.0)
Monocytes Relative: 10.8 % (ref 3.0–12.0)
Neutro Abs: 5.9 10*3/uL (ref 1.4–7.7)
Neutrophils Relative %: 75.6 % (ref 43.0–77.0)
Platelets: 349 10*3/uL (ref 150.0–400.0)
RBC: 4.97 Mil/uL (ref 3.87–5.11)
RDW: 15.2 % (ref 11.5–15.5)
WBC: 7.8 10*3/uL (ref 4.0–10.5)

## 2021-07-09 LAB — URINALYSIS, ROUTINE W REFLEX MICROSCOPIC
Bilirubin Urine: NEGATIVE
Hgb urine dipstick: NEGATIVE
Ketones, ur: NEGATIVE
Nitrite: NEGATIVE
Specific Gravity, Urine: <=1.005 — AB (ref 1.000–1.030)
Total Protein, Urine: NEGATIVE
Urine Glucose: NEGATIVE
Urobilinogen, UA: 0.2 (ref 0.0–1.0)
pH: 7 (ref 5.0–8.0)

## 2021-07-09 LAB — HEPATIC FUNCTION PANEL
ALT: 23 U/L (ref 0–35)
AST: 27 U/L (ref 0–37)
Albumin: 3.9 g/dL (ref 3.5–5.2)
Alkaline Phosphatase: 65 U/L (ref 39–117)
Bilirubin, Direct: 0.1 mg/dL (ref 0.0–0.3)
Total Bilirubin: 0.6 mg/dL (ref 0.2–1.2)
Total Protein: 6.4 g/dL (ref 6.0–8.3)

## 2021-07-09 LAB — LIPID PANEL
Cholesterol: 148 mg/dL (ref 0–200)
HDL: 58.3 mg/dL (ref >39.00)
LDL Cholesterol: 71 mg/dL (ref 0–99)
NonHDL: 89.37
Total CHOL/HDL Ratio: 3
Triglycerides: 93 mg/dL (ref 0.0–149.0)
VLDL: 18.6 mg/dL (ref 0.0–40.0)

## 2021-07-09 LAB — HEMOGLOBIN A1C: Hgb A1c MFr Bld: 5.8 % (ref 4.6–6.5)

## 2021-07-09 LAB — BASIC METABOLIC PANEL
BUN: 36 mg/dL — ABNORMAL HIGH (ref 6–23)
CO2: 24 mEq/L (ref 19–32)
Calcium: 9.2 mg/dL (ref 8.4–10.5)
Chloride: 104 mEq/L (ref 96–112)
Creatinine, Ser: 1.43 mg/dL — ABNORMAL HIGH (ref 0.40–1.20)
GFR: 31.65 mL/min — ABNORMAL LOW (ref >60.00)
Glucose, Bld: 94 mg/dL (ref 70–99)
Potassium: 4.3 mEq/L (ref 3.5–5.1)
Sodium: 138 mEq/L (ref 135–145)

## 2021-07-09 LAB — TSH: TSH: 2.29 u[IU]/mL (ref 0.35–5.50)

## 2021-07-09 LAB — VITAMIN D 25 HYDROXY (VIT D DEFICIENCY, FRACTURES): VITD: 57.7 ng/mL (ref 30.00–100.00)

## 2021-07-09 LAB — VITAMIN B12: Vitamin B-12: 882 pg/mL (ref 211–911)

## 2021-07-09 NOTE — Progress Notes (Signed)
Patient ID: Alexandra Reyes, female   DOB: [birth year], 88 y.o.   MRN: [id number]        Chief Complaint: follow up urinary frequency, ckd, htn, hyperglycemia, hld       HPI:  Alexandra Reyes is a 88 y.o. female here with c/o 3 days osnet urinary frequency but Denies urinary symptoms such as dysuria, urgency, flank pain, hematuria or n/v, fever, chills.   Pt denies chest pain, increased sob or doe, wheezing, orthopnea, PND, increased LE swelling, palpitations, dizziness or syncope.   Pt denies polydipsia, polyuria, or new focal neuro s/s.   Pt denies fever, wt loss, night sweats, loss of appetite, or other constitutional symptoms       Wt Readings from Last 3 Encounters:  07/09/21 105 lb (47.6 kg)  05/07/21 104 lb (47.2 kg)  04/30/21 102 lb 9.6 oz (46.5 kg)   BP Readings from Last 3 Encounters:  07/09/21 120/70  05/07/21 122/64  04/30/21 (!) 126/54         Past Medical History:  Diagnosis Date   Age-related macular degeneration, wet, both eyes (HCC)    Alcohol dependence in remission ([place]) 05/22/2011   Allergic rhinitis, cause unspecified 05/22/2011   Allergy    Anemia, iron deficiency 05/18/2011   Cholelithiasis    COPD (chronic obstructive pulmonary disease) ([place])    DDD (degenerative disc disease), lumbar 05/18/2011   Depression    "@ times" (09/19/2016)   First degree AV block    GERD (gastroesophageal reflux disease)    Hiatal hernia    History of blood transfusion    "3 or 4 related to childbirth"   HTN (hypertension) 05/18/2011   pt denies this hx on 09/19/2016   Hyperlipidemia 05/18/2011   Macular degeneration    Myocardial infarction ([place])    "EKG shows I've had 2; date unknown" (09/19/2016)   OA (osteoarthritis)    "a little here and there; mainly in the back" (09/19/2016)   Osteoporosis 05/18/2011   PAT (paroxysmal atrial tachycardia) (HCC)    Pectus excavatum 05/18/2011   Personal history of colonic polyps 10/28/2012   Pneumonia    "as a child"   SVT (supraventricular  tachycardia) ([place])    Urinary frequency    Past Surgical History:  Procedure Laterality Date   [place]   "it was ok"   CARDIAC CATHETERIZATION     CARDIOVASCULAR STRESS TEST  03/08/2009   EF 84%   CATARACT EXTRACTION W/ INTRAOCULAR LENS  IMPLANT, BILATERAL Bilateral    INGUINAL HERNIA REPAIR Right 06/23/2017   Procedure: REPAIR INCARCERATED  RIGHT FEMORAL HERNIA WITH MESH;  Surgeon: Erroll Luna, MD;  Location: [facility];  Service: General;  Laterality: Right;   KNEE ARTHROSCOPY Right    REVERSE SHOULDER ARTHROPLASTY Left 09/05/2020   Procedure: REVERSE SHOULDER ARTHROPLASTY;  Surgeon: Nicholes Stairs, MD;  Location: [facility];  Service: Orthopedics;  Laterality: Left;   RIGHT/LEFT HEART CATH AND CORONARY ANGIOGRAPHY N/A 07/25/2016   Procedure: Right/Left Heart Cath and Coronary Angiography;  Surgeon: Burnell Blanks, MD;  Location: [facility];  Service: Cardiovascular;  Laterality: N/A;   SVT ABLATION  09/19/2016   SVT ABLATION N/A 09/19/2016   Procedure: SVT Ablation;  Surgeon: Thompson Grayer, MD;  Location: [facility];  Service: Cardiovascular;  Laterality: N/A;   TONSILLECTOMY AND ADENOIDECTOMY     US ECHOCARDIOGRAPHY  01/17/2003   EF 55-60%    reports that  she quit smoking about 27 years ago. Her smoking use included cigarettes. She started smoking about 62 years ago. She has a 54.00 pack-year smoking history. She has never used smokeless tobacco. She reports that she does not drink alcohol and does not use drugs. family history includes Arthritis in her mother; Heart disease in her father; Heart failure in her mother; Hypertension in her father; Kidney failure in her father. Allergies  Allergen Reactions   Citalopram    Citalopram Hydrobromide Other (See Comments)   Oxycodone Other (See Comments)    hallucinations   Current Outpatient Medications on File Prior to Visit  Medication Sig Dispense Refill    acetaminophen (TYLENOL) 500 MG tablet Take 1,000 mg by mouth 2 (two) times daily as needed for moderate pain or headache.     albuterol (VENTOLIN HFA) 108 (90 Base) MCG/ACT inhaler Inhale 2 puffs into the lungs every 6 (six) hours as needed for wheezing or shortness of breath. 8 g 6   ALPRAZolam (XANAX) 0.25 MG tablet TAKE ONE TABLET BY MOUTH EVERY NIGHT AT BEDTIME AS NEEDED FOR ANXIETY 90 tablet 1   Ascorbic Acid (VITAMIN C PO) Take 500 mg by mouth daily. Reported on 08/08/2015     aspirin 81 MG tablet Take 81 mg by mouth at bedtime.     B Complex Vitamins (VITAMIN B COMPLEX PO) Take 1 tablet by mouth daily.     budesonide-formoterol (SYMBICORT) 80-4.5 MCG/ACT inhaler SMARTSIG:By Mouth     calcium carbonate (OS-CAL) 600 MG tablet Take 600 mg by mouth daily.     diltiazem (CARDIZEM) 30 MG tablet Take 1 tablet (30 mg total) by mouth 4 (four) times daily as needed. 60 tablet 11   diltiazem (CARTIA XT) 120 MG 24 hr capsule Take 1 capsule (120 mg total) by mouth daily. 90 capsule 3   fluticasone furoate-vilanterol (BREO ELLIPTA) 100-25 MCG/INH AEPB Inhale 1 puff into the lungs daily. 60 each 2   furosemide (LASIX) 20 MG tablet Take 1 tablet (20 mg total) by mouth daily. 90 tablet 3   Glucosamine HCl-MSM (GLUCOSAMINE-MSM PO) Take 2 tablets by mouth daily.      megestrol (MEGACE) 40 MG tablet Take 1 tablet (40 mg total) by mouth daily. 90 tablet 1   nitroGLYCERIN (NITROSTAT) 0.4 MG SL tablet Place 1 tablet (0.4 mg total) under the tongue every 5 (five) minutes as needed for chest pain. 25 tablet 3   omeprazole (PRILOSEC) 40 MG capsule TAKE ONE CAPSULE BY MOUTH DAILY 90 capsule 1   ondansetron (ZOFRAN) 4 MG tablet Take 1 tablet (4 mg total) by mouth every 8 (eight) hours as needed for nausea or vomiting. 30 tablet 1   oxyCODONE (OXY IR/ROXICODONE) 5 MG immediate release tablet Roxicodone 5 mg tablet   5 mg by oral route.     Respiratory Therapy Supplies (FLUTTER) DEVI Use as directed 1 each 0    simvastatin (ZOCOR) 10 MG tablet Take 1 tablet (10 mg total) by mouth daily. (Patient taking differently: Take 10 mg by mouth daily at 6 PM.) 90 tablet 3   traMADol (ULTRAM) 50 MG tablet TAKE ONE TABLET BY MOUTH TWICE A DAY AS NEEDED 60 tablet 2   triamcinolone cream (KENALOG) 0.1 % Apply 1 application topically daily as needed (hives). Apply to affected area 30 g 0   vitamin E 200 UNIT capsule Take 200 Units by mouth daily.     No current facility-administered medications on file prior to visit.  ROS:  All others reviewed and negative.  Objective        PE:  BP 120/70 (BP Location: Left Arm, Patient Position: Sitting, Cuff Size: Large)    Pulse 85    Temp 97.6 F (36.4 C) (Oral)    Ht 5\' 2"  (1.575 m)    Wt 105 lb (47.6 kg)    LMP  (LMP Unknown)    SpO2 97%    BMI 19.20 kg/m                 Constitutional: Pt appears in NAD               HENT: Head: NCAT.                Right Ear: External ear normal.                 Left Ear: External ear normal.                Eyes: . Pupils are equal, round, and reactive to light. Conjunctivae and EOM are normal               Nose: without d/c or deformity               Neck: Neck supple. Gross normal ROM               Cardiovascular: Normal rate and regular rhythm.                 Pulmonary/Chest: Effort normal and breath sounds without rales or wheezing.                Abd:  Soft, NT, ND, + BS, no organomegaly               Neurological: Pt is alert. At baseline orientation, motor grossly intact               Skin: Skin is warm. No rashes, no other new lesions, LE edema - none               Psychiatric: Pt behavior is normal without agitation   Micro: none  Cardiac tracings I have personally interpreted today:  none  Pertinent Radiological findings (summarize): none   Lab Results  Component Value Date   WBC 7.8 07/09/2021   HGB 14.8 07/09/2021   HCT 45.5 07/09/2021   PLT 349.0 07/09/2021   GLUCOSE 94 07/09/2021   CHOL 148  07/09/2021   TRIG 93.0 07/09/2021   HDL 58.30 07/09/2021   LDLDIRECT 129.2 05/15/2011   LDLCALC 71 07/09/2021   ALT 23 07/09/2021   AST 27 07/09/2021   NA 138 07/09/2021   K 4.3 07/09/2021   CL 104 07/09/2021   CREATININE 1.43 (H) 07/09/2021   BUN 36 (H) 07/09/2021   CO2 24 07/09/2021   TSH 2.29 07/09/2021   INR 1.0 07/17/2016   HGBA1C 5.8 07/09/2021   Assessment/Plan:  DASHANA GROOVER is a 88 y.o. White or Caucasian [1] female with  has a past medical history of Age-related macular degeneration, wet, both eyes ([place]), Alcohol dependence in remission ([place]) (05/22/2011), Allergic rhinitis, cause unspecified (05/22/2011), Allergy, Anemia, iron deficiency (05/18/2011), Cholelithiasis, COPD (chronic obstructive pulmonary disease) ([place]), DDD (degenerative disc disease), lumbar (05/18/2011), Depression, First degree AV block, GERD (gastroesophageal reflux disease), Hiatal hernia, History of blood transfusion, HTN (hypertension) (05/18/2011), Hyperlipidemia (05/18/2011), Macular degeneration, Myocardial infarction ([place]), OA (osteoarthritis), Osteoporosis (05/18/2011), PAT (paroxysmal atrial tachycardia) ([place]), Pectus  excavatum (05/18/2011), Personal history of colonic polyps (10/28/2012), Pneumonia, SVT (supraventricular tachycardia) ([place]), and Urinary frequency.  CKD (chronic kidney disease), stage III (HCC) Lab Results  Component Value Date   CREATININE 1.43 (H) 07/09/2021   Stable overall, cont to avoid nephrotoxins   HTN (hypertension) BP Readings from Last 3 Encounters:  07/09/21 120/70  05/07/21 122/64  04/30/21 (!) 126/54   Stable, pt to continue medical treatment cardizem   Hyperglycemia Lab Results  Component Value Date   HGBA1C 5.8 07/09/2021   Stable, pt to continue current medical treatment  - diet   Hyperlipidemia Lab Results  Component Value Date   LDLCALC 71 07/09/2021   Mild uncontrolled, goal ldl < 70 pt to continue current statin zocor 10 as declines  change   Urinary frequency With possible UTI - for urine culture and tx pending results  Followup: Return in about 6 months (around 01/06/2022).  Alexandra Cower, MD 07/15/2021 9:56 PM [facility]

## 2021-07-09 NOTE — Patient Instructions (Addendum)

## 2021-07-11 DIAGNOSIS — H35371 Puckering of macula, right eye: Secondary | ICD-10-CM | POA: Diagnosis not present

## 2021-07-11 DIAGNOSIS — H43392 Other vitreous opacities, left eye: Secondary | ICD-10-CM | POA: Diagnosis not present

## 2021-07-11 DIAGNOSIS — H43813 Vitreous degeneration, bilateral: Secondary | ICD-10-CM | POA: Diagnosis not present

## 2021-07-11 DIAGNOSIS — H353233 Exudative age-related macular degeneration, bilateral, with inactive scar: Secondary | ICD-10-CM | POA: Diagnosis not present

## 2021-07-15 ENCOUNTER — Encounter: Payer: Self-pay | Admitting: Internal Medicine

## 2021-07-15 DIAGNOSIS — R350 Frequency of micturition: Secondary | ICD-10-CM | POA: Insufficient documentation

## 2021-07-15 NOTE — Assessment & Plan Note (Signed)
With possible UTI - for urine culture and tx pending results

## 2021-07-15 NOTE — Assessment & Plan Note (Signed)
BP Readings from Last 3 Encounters:  07/09/21 120/70  05/07/21 122/64  04/30/21 (!) 126/54   Stable, pt to continue medical treatment cardizem

## 2021-07-15 NOTE — Assessment & Plan Note (Signed)
Lab Results  Component Value Date   LDLCALC 71 07/09/2021   Mild uncontrolled, goal ldl < 70 pt to continue current statin zocor 10 as declines change

## 2021-07-15 NOTE — Assessment & Plan Note (Signed)
Lab Results  Component Value Date   CREATININE 1.43 (H) 07/09/2021   Stable overall, cont to avoid nephrotoxins

## 2021-07-15 NOTE — Assessment & Plan Note (Signed)
Lab Results  Component Value Date   HGBA1C 5.8 07/09/2021   Stable, pt to continue current medical treatment  - diet

## 2021-07-15 NOTE — Assessment & Plan Note (Signed)
>>  ASSESSMENT AND PLAN FOR HTN (HYPERTENSION) WRITTEN ON 07/15/2021  9:53 PM BY Corwin Levins, MD  BP Readings from Last 3 Encounters:  07/09/21 120/70  05/07/21 122/64  04/30/21 (!) 126/54   Stable, pt to continue medical treatment cardizem

## 2021-07-17 ENCOUNTER — Other Ambulatory Visit (INDEPENDENT_AMBULATORY_CARE_PROVIDER_SITE_OTHER): Payer: Medicare Other

## 2021-07-17 ENCOUNTER — Telehealth: Payer: Self-pay | Admitting: Pharmacy Technician

## 2021-07-17 ENCOUNTER — Other Ambulatory Visit (HOSPITAL_COMMUNITY): Payer: Self-pay

## 2021-07-17 DIAGNOSIS — N179 Acute kidney failure, unspecified: Secondary | ICD-10-CM | POA: Diagnosis not present

## 2021-07-17 LAB — BASIC METABOLIC PANEL WITH GFR
BUN: 32 mg/dL — ABNORMAL HIGH (ref 6–23)
CO2: 22 meq/L (ref 19–32)
Calcium: 9 mg/dL (ref 8.4–10.5)
Chloride: 107 meq/L (ref 96–112)
Creatinine, Ser: 1.21 mg/dL — ABNORMAL HIGH (ref 0.40–1.20)
GFR: 38.67 mL/min — ABNORMAL LOW
Glucose, Bld: 85 mg/dL (ref 70–99)
Potassium: 4.7 meq/L (ref 3.5–5.1)
Sodium: 136 meq/L (ref 135–145)

## 2021-07-17 NOTE — Telephone Encounter (Signed)
Patient Advocate Encounter  Received notification from [facility]) that prior authorization for [facility] is required.   PA NOT NEEDED. Symbicort is no longer covered on pt insurance. Pt was provided a 30 day transitional fill. Insurance will cover [facility] [place] with out a PA, then [facility] with a [facility] will continue to follow  Luciano Cutter, CPhT Patient Advocate Phone: [phone] Fax:  [phone]

## 2021-07-18 ENCOUNTER — Telehealth: Payer: Self-pay | Admitting: Internal Medicine

## 2021-07-18 NOTE — Telephone Encounter (Signed)
Discussed how and when to take Lasix with patient. Patient verbalizes understanding

## 2021-07-18 NOTE — Telephone Encounter (Signed)
Pt checking status of 07-17-2021 lab results, informed pt of provider's 07-17-2021 lab result notes  Pt inquiring if she does experience swelling, does she continue the lasix until the swelling subsides  Pt requesting a c/b w/ provider's recommendation

## 2021-07-19 ENCOUNTER — Telehealth: Payer: Self-pay | Admitting: Pulmonary Disease

## 2021-07-19 MED ORDER — FLUTICASONE FUROATE-VILANTEROL 100-25 MCG/ACT IN AEPB: 1.0000 | INHALATION_SPRAY | Freq: Every day | RESPIRATORY_TRACT | 2 refills | Status: DC

## 2021-07-19 NOTE — Telephone Encounter (Signed)
I called the patient and let her know that a refill was sent to the pharmacy. She was asked to call back with any questions.

## 2021-07-25 ENCOUNTER — Other Ambulatory Visit: Payer: Self-pay | Admitting: *Deleted

## 2021-07-25 MED ORDER — SIMVASTATIN 10 MG PO TABS: 10.0000 mg | ORAL_TABLET | Freq: Every day | ORAL | 3 refills | Status: DC

## 2021-08-27 ENCOUNTER — Other Ambulatory Visit: Payer: Self-pay | Admitting: Internal Medicine

## 2021-09-14 ENCOUNTER — Other Ambulatory Visit (HOSPITAL_COMMUNITY): Payer: Self-pay

## 2021-09-14 NOTE — Telephone Encounter (Signed)
Alexandra Reyes is a $154.10 copay, no PA needed. ?

## 2021-10-09 ENCOUNTER — Telehealth: Payer: Self-pay | Admitting: Internal Medicine

## 2021-10-09 NOTE — Telephone Encounter (Signed)
Pt states her family is discussing putting her in  assistant living and due to that she is suffering w/ anxiety and depression ? ?Pt states she only takes xanax at night and requesting a rx for daily use to treat mood disorder ? ?Please advise ?

## 2021-10-10 DIAGNOSIS — H43813 Vitreous degeneration, bilateral: Secondary | ICD-10-CM | POA: Diagnosis not present

## 2021-10-10 DIAGNOSIS — H35371 Puckering of macula, right eye: Secondary | ICD-10-CM | POA: Diagnosis not present

## 2021-10-10 MED ORDER — BUPROPION HCL ER (XL) 150 MG PO TB24: 150.0000 mg | ORAL_TABLET | Freq: Every day | ORAL | 3 refills | Status: DC

## 2021-10-10 MED ORDER — ALPRAZOLAM 0.25 MG PO TABS: 0.2500 mg | ORAL_TABLET | Freq: Two times a day (BID) | ORAL | 5 refills | Status: DC | PRN

## 2021-10-10 NOTE — Telephone Encounter (Signed)
Patient notified, but states that she was actually looking for something to take for depression that would replace the Citalopram that she used to take. Citalopram caused patient to become drowsy ?

## 2021-10-10 NOTE — Telephone Encounter (Signed)
Ok this was changed to bid prn ?

## 2021-10-10 NOTE — Telephone Encounter (Signed)
Patient notified via voicemail and patient's son notified as well ?

## 2021-10-10 NOTE — Telephone Encounter (Signed)
Ok sorry, I misunderstood ? ?Ok to not take the celexa, but take wellbutring XL 150 qd - done erx, as this is very easy to take without side effects ?

## 2021-12-04 ENCOUNTER — Other Ambulatory Visit: Payer: Self-pay | Admitting: Internal Medicine

## 2021-12-05 ENCOUNTER — Other Ambulatory Visit: Payer: Self-pay | Admitting: Internal Medicine

## 2021-12-06 ENCOUNTER — Other Ambulatory Visit (HOSPITAL_BASED_OUTPATIENT_CLINIC_OR_DEPARTMENT_OTHER): Payer: Self-pay | Admitting: Internal Medicine

## 2022-01-01 ENCOUNTER — Other Ambulatory Visit: Payer: Self-pay | Admitting: Pulmonary Disease

## 2022-01-07 ENCOUNTER — Encounter: Payer: Self-pay | Admitting: Internal Medicine

## 2022-01-07 ENCOUNTER — Ambulatory Visit (INDEPENDENT_AMBULATORY_CARE_PROVIDER_SITE_OTHER): Payer: Medicare Other | Admitting: Internal Medicine

## 2022-01-07 VITALS — BP 112/68 | HR 79 | Temp 98.3°F | Ht 62.0 in | Wt 107.0 lb

## 2022-01-07 DIAGNOSIS — N1831 Chronic kidney disease, stage 3a: Secondary | ICD-10-CM

## 2022-01-07 DIAGNOSIS — E7800 Pure hypercholesterolemia, unspecified: Secondary | ICD-10-CM | POA: Diagnosis not present

## 2022-01-07 DIAGNOSIS — K5900 Constipation, unspecified: Secondary | ICD-10-CM

## 2022-01-07 DIAGNOSIS — F32A Depression, unspecified: Secondary | ICD-10-CM

## 2022-01-07 DIAGNOSIS — I10 Essential (primary) hypertension: Secondary | ICD-10-CM | POA: Diagnosis not present

## 2022-01-07 DIAGNOSIS — R600 Localized edema: Secondary | ICD-10-CM

## 2022-01-07 DIAGNOSIS — R609 Edema, unspecified: Secondary | ICD-10-CM | POA: Diagnosis not present

## 2022-01-07 DIAGNOSIS — R739 Hyperglycemia, unspecified: Secondary | ICD-10-CM

## 2022-01-07 LAB — HEMOGLOBIN A1C: Hgb A1c MFr Bld: 6.1 % (ref 4.6–6.5)

## 2022-01-07 NOTE — Assessment & Plan Note (Signed)
Lab Results  Component Value Date   LDLCALC 71 07/09/2021   Stable, pt to continue current statin zocor 10 mg qd

## 2022-01-07 NOTE — Assessment & Plan Note (Signed)
With mild worsening, for add miralax dail to colace 100 bid

## 2022-01-07 NOTE — Assessment & Plan Note (Signed)
Lab Results  Component Value Date   CREATININE 1.21 (H) 07/17/2021   Stable overall, cont to avoid nephrotoxins

## 2022-01-07 NOTE — Patient Instructions (Signed)
Ok to take the OTC Colace twice per day AND the miralax daily with it  Please continue all other medications as before, and refills have been done if requested.  Please have the pharmacy call with any other refills you may need.  Please keep your appointments with your specialists as you may have planned  Please go to the LAB at the blood drawing area for the tests to be done  You will be contacted by phone if any changes need to be made immediately.  Otherwise, you will receive a letter about your results with an explanation, but please check with MyChart first.  Please remember to sign up for MyChart if you have not done so, as this will be important to you in the future with finding out test results, communicating by private email, and scheduling acute appointments online when needed.  Please make an Appointment to return in 6 months, or sooner if needed, also with Lab Appointment for testing done 3-5 days before at the FIRST FLOOR Lab (so this is for TWO appointments - please see the scheduling desk as you leave)

## 2022-01-07 NOTE — Progress Notes (Signed)
Patient ID: Alexandra Reyes, female   DOB: Oct 18, 1925, 88 y.o.   MRN: [id number]        Chief Complaint: follow up depression, ckd, leg swelling, constipation       HPI:  Alexandra Reyes is a 88 y.o. female here overall doing ok with friend for support, has no family but pt now being assisted with move to local assisted living, no longer driving.  Recently daugher living out of town has arranged for food deliveries.  Pt denies chest pain, increased sob or doe, wheezing, orthopnea, PND, increased LE swelling, palpitations, dizziness or syncope.  Pt denies polydipsia, polyuria, or new focal neuro s/s.   Depession symptoms much improved with wellbutrin xl 150 qd.  Does have mild recent worsening constipation however and wondering if she can take a laxative with the colace bid.       Wt Readings from Last 3 Encounters:  01/07/22 107 lb (48.5 kg)  07/09/21 105 lb (47.6 kg)  05/07/21 104 lb (47.2 kg)   BP Readings from Last 3 Encounters:  01/07/22 112/68  07/09/21 120/70  05/07/21 122/64         Past Medical History:  Diagnosis Date   Age-related macular degeneration, wet, both eyes (HCC)    Alcohol dependence in remission (HCC) 05/22/2011   Allergic rhinitis, cause unspecified 05/22/2011   Allergy    Anemia, iron deficiency 05/18/2011   Cholelithiasis    COPD (chronic obstructive pulmonary disease) (HCC)    DDD (degenerative disc disease), lumbar 05/18/2011   Depression    "@ times" (09/19/2016)   First degree AV block    GERD (gastroesophageal reflux disease)    Hiatal hernia    History of blood transfusion    "3 or 4 related to childbirth"   HTN (hypertension) 05/18/2011   pt denies this hx on 09/19/2016   Hyperlipidemia 05/18/2011   Macular degeneration    Myocardial infarction (HCC)    "EKG shows I've had 2; date unknown" (09/19/2016)   OA (osteoarthritis)    "a little here and there; mainly in the back" (09/19/2016)   Osteoporosis 05/18/2011   PAT (paroxysmal atrial tachycardia)  (HCC)    Pectus excavatum 05/18/2011   Personal history of colonic polyps 10/28/2012   Pneumonia    "as a child"   SVT (supraventricular tachycardia) (HCC)    Urinary frequency    Past Surgical History:  Procedure Laterality Date   ABDOMINAL HYSTERECTOMY  1988   BREAST BIOPSY Right 1948   "it was ok"   CARDIAC CATHETERIZATION     CARDIOVASCULAR STRESS TEST  03/08/2009   EF 84%   CATARACT EXTRACTION W/ INTRAOCULAR LENS  IMPLANT, BILATERAL Bilateral    INGUINAL HERNIA REPAIR Right 06/23/2017   Procedure: REPAIR INCARCERATED  RIGHT FEMORAL HERNIA WITH MESH;  Surgeon: Harriette Bouillon, MD;  Location: [facility] OR;  Service: General;  Laterality: Right;   KNEE ARTHROSCOPY Right    REVERSE SHOULDER ARTHROPLASTY Left 09/05/2020   Procedure: REVERSE SHOULDER ARTHROPLASTY;  Surgeon: Yolonda Kida, MD;  Location: [facility] OR;  Service: Orthopedics;  Laterality: Left;   RIGHT/LEFT HEART CATH AND CORONARY ANGIOGRAPHY N/A 07/25/2016   Procedure: Right/Left Heart Cath and Coronary Angiography;  Surgeon: Kathleene Hazel, MD;  Location: [facility] INVASIVE CV LAB;  Service: Cardiovascular;  Laterality: N/A;   SVT ABLATION  09/19/2016   SVT ABLATION N/A 09/19/2016   Procedure: SVT Ablation;  Surgeon: Hillis Range, MD;  Location: [facility] INVASIVE CV LAB;  Service: Cardiovascular;  Laterality:  N/A;   TONSILLECTOMY AND ADENOIDECTOMY     US ECHOCARDIOGRAPHY  01/17/2003   EF 55-60%    reports that she quit smoking about 27 years ago. Her smoking use included cigarettes. She started smoking about 62 years ago. She has a 54.00 pack-year smoking history. She has never used smokeless tobacco. She reports that she does not drink alcohol and does not use drugs. family history includes Arthritis in her mother; Heart disease in her father; Heart failure in her mother; Hypertension in her father; Kidney failure in her father. Allergies  Allergen Reactions   Citalopram    Citalopram Hydrobromide Other (See Comments)   Oxycodone  Other (See Comments)    hallucinations   Current Outpatient Medications on File Prior to Visit  Medication Sig Dispense Refill   acetaminophen (TYLENOL) 500 MG tablet Take 1,000 mg by mouth 2 (two) times daily as needed for moderate pain or headache.     albuterol (VENTOLIN HFA) 108 (90 Base) MCG/ACT inhaler Inhale 2 puffs into the lungs every 6 (six) hours as needed for wheezing or shortness of breath. 8 g 6   ALPRAZolam (XANAX) 0.25 MG tablet Take 1 tablet (0.25 mg total) by mouth 2 (two) times daily as needed for anxiety. 60 tablet 5   Ascorbic Acid (VITAMIN C PO) Take 500 mg by mouth daily. Reported on 08/08/2015     aspirin 81 MG tablet Take 81 mg by mouth at bedtime.     B Complex Vitamins (VITAMIN B COMPLEX PO) Take 1 tablet by mouth daily.     BREO ELLIPTA 100-25 MCG/ACT AEPB INHALE 1 PUFF INTO THE LUNGS DAILY 60 each 2   buPROPion (WELLBUTRIN XL) 150 MG 24 hr tablet Take 1 tablet (150 mg total) by mouth daily. 90 tablet 3   calcium carbonate (OS-CAL) 600 MG tablet Take 600 mg by mouth daily.     cetirizine (ZYRTEC) 10 MG tablet Take 10 mg by mouth daily.     diltiazem (CARDIZEM CD) 120 MG 24 hr capsule TAKE ONE CAPSULE BY MOUTH DAILY 90 capsule 0   diltiazem (CARDIZEM) 30 MG tablet Take 1 tablet (30 mg total) by mouth 4 (four) times daily as needed. 60 tablet 11   Glucosamine HCl-MSM (GLUCOSAMINE-MSM PO) Take 2 tablets by mouth daily.      nitroGLYCERIN (NITROSTAT) 0.4 MG SL tablet Place 1 tablet (0.4 mg total) under the tongue every 5 (five) minutes as needed for chest pain. 25 tablet 3   omeprazole (PRILOSEC) 40 MG capsule TAKE ONE CAPSULE BY MOUTH DAILY 90 capsule 1   Respiratory Therapy Supplies (FLUTTER) DEVI Use as directed 1 each 0   simvastatin (ZOCOR) 10 MG tablet Take 1 tablet (10 mg total) by mouth daily. 90 tablet 3   traMADol (ULTRAM) 50 MG tablet TAKE ONE TABLET BY MOUTH TWICE A DAY AS NEEDED 60 tablet 2   triamcinolone cream (KENALOG) 0.1 % Apply 1 application topically  daily as needed (hives). Apply to affected area 30 g 0   vitamin E 200 UNIT capsule Take 200 Units by mouth daily.     furosemide (LASIX) 20 MG tablet TAKE ONE TABLET BY MOUTH DAILY (Patient not taking: Reported on 01/07/2022) 30 tablet 0   ondansetron (ZOFRAN) 4 MG tablet Take 1 tablet (4 mg total) by mouth every 8 (eight) hours as needed for nausea or vomiting. (Patient not taking: Reported on 01/07/2022) 30 tablet 1   No current facility-administered medications on file prior to visit.  ROS:  All others reviewed and negative.  Objective        PE:  BP 112/68 (BP Location: Left Arm, Patient Position: Sitting, Cuff Size: Normal)   Pulse 79   Temp 98.3 F (36.8 C) (Oral)   Ht 5\' 2"  (1.575 m)   Wt 107 lb (48.5 kg)   LMP  (LMP Unknown)   SpO2 97%   BMI 19.57 kg/m                 Constitutional: Pt appears in NAD               HENT: Head: NCAT.                Right Ear: External ear normal.                 Left Ear: External ear normal.                Eyes: . Pupils are equal, round, and reactive to light. Conjunctivae and EOM are normal               Nose: without d/c or deformity               Neck: Neck supple. Gross normal ROM               Cardiovascular: Normal rate and regular rhythm.                 Pulmonary/Chest: Effort normal and breath sounds without rales or wheezing.                Abd:  Soft, NT, ND, + BS, no organomegaly               Neurological: Pt is alert. At baseline orientation, motor grossly intact               Skin: Skin is warm. No rashes, no other new lesions, LE edema - none               Psychiatric: Pt behavior is normal without agitation   Micro: none  Cardiac tracings I have personally interpreted today:  none  Pertinent Radiological findings (summarize): none   Lab Results  Component Value Date   WBC 7.8 07/09/2021   HGB 14.8 07/09/2021   HCT 45.5 07/09/2021   PLT 349.0 07/09/2021   GLUCOSE 85 07/17/2021   CHOL 148 07/09/2021    TRIG 93.0 07/09/2021   HDL 58.30 07/09/2021   LDLDIRECT 129.2 05/15/2011   LDLCALC 71 07/09/2021   ALT 23 07/09/2021   AST 27 07/09/2021   NA 136 07/17/2021   K 4.7 07/17/2021   CL 107 07/17/2021   CREATININE 1.21 (H) 07/17/2021   BUN 32 (H) 07/17/2021   CO2 22 07/17/2021   TSH 2.29 07/09/2021   INR 1.0 07/17/2016   HGBA1C 5.8 07/09/2021   Assessment/Plan:  Alexandra Reyes is a 88 y.o. White or Caucasian [1] female with  has a past medical history of Age-related macular degeneration, wet, both eyes (HCC), Alcohol dependence in remission (HCC) (05/22/2011), Allergic rhinitis, cause unspecified (05/22/2011), Allergy, Anemia, iron deficiency (05/18/2011), Cholelithiasis, COPD (chronic obstructive pulmonary disease) (HCC), DDD (degenerative disc disease), lumbar (05/18/2011), Depression, First degree AV block, GERD (gastroesophageal reflux disease), Hiatal hernia, History of blood transfusion, HTN (hypertension) (05/18/2011), Hyperlipidemia (05/18/2011), Macular degeneration, Myocardial infarction (HCC), OA (osteoarthritis), Osteoporosis (05/18/2011), PAT (paroxysmal atrial tachycardia) (HCC), Pectus excavatum (05/18/2011), Personal history of colonic polyps (  10/28/2012), Pneumonia, SVT (supraventricular tachycardia) ([place]), and Urinary frequency.  Hyperlipidemia Lab Results  Component Value Date   LDLCALC 71 07/09/2021   Stable, pt to continue current statin zocor 10 mg qd   Hyperglycemia Lab Results  Component Value Date   HGBA1C 5.8 07/09/2021   Stable, pt to continue current medical treatment  - diet, wt control  HTN (hypertension) BP Readings from Last 3 Encounters:  01/07/22 112/68  07/09/21 120/70  05/07/21 122/64   Stable, pt to continue medical treatment card cd 120 qd    Depression Improved,continue wellbutrin xl 150 qd  Constipation With mild worsening, for add miralax dail to colace 100 bid  CKD (chronic kidney disease), stage III ([place]) Lab Results  Component  Value Date   CREATININE 1.21 (H) 07/17/2021   Stable overall, cont to avoid nephrotoxins   Peripheral edema Controlled, for lasix 20 qd prn only  Followup: Return in about 6 months (around 07/10/2022).  Cathlean Cower, MD 01/07/2022 7:57 PM [facility]

## 2022-01-07 NOTE — Assessment & Plan Note (Signed)
>>  ASSESSMENT AND PLAN FOR HTN (HYPERTENSION) WRITTEN ON 01/07/2022  7:55 PM BY Corwin Levins, MD  BP Readings from Last 3 Encounters:  01/07/22 112/68  07/09/21 120/70  05/07/21 122/64   Stable, pt to continue medical treatment card cd 120 qd

## 2022-01-07 NOTE — Assessment & Plan Note (Signed)
Controlled, for lasix 20 qd prn only

## 2022-01-07 NOTE — Assessment & Plan Note (Signed)
BP Readings from Last 3 Encounters:  01/07/22 112/68  07/09/21 120/70  05/07/21 122/64   Stable, pt to continue medical treatment card cd 120 qd

## 2022-01-07 NOTE — Assessment & Plan Note (Signed)
Lab Results  Component Value Date   HGBA1C 5.8 07/09/2021   Stable, pt to continue current medical treatment  - diet, wt control

## 2022-01-07 NOTE — Assessment & Plan Note (Signed)
Improved,continue wellbutrin xl 150 qd

## 2022-01-08 LAB — LIPID PANEL
Cholesterol: 200 mg/dL (ref 0–200)
HDL: 88.6 mg/dL (ref >39.00)
LDL Cholesterol: 88 mg/dL (ref 0–99)
NonHDL: 111.03
Total CHOL/HDL Ratio: 2
Triglycerides: 114 mg/dL (ref 0.0–149.0)
VLDL: 22.8 mg/dL (ref 0.0–40.0)

## 2022-01-08 LAB — HEPATIC FUNCTION PANEL
ALT: 20 U/L (ref 0–35)
AST: 23 U/L (ref 0–37)
Albumin: 4.3 g/dL (ref 3.5–5.2)
Alkaline Phosphatase: 74 U/L (ref 39–117)
Bilirubin, Direct: 0.1 mg/dL (ref 0.0–0.3)
Total Bilirubin: 0.5 mg/dL (ref 0.2–1.2)
Total Protein: 6.7 g/dL (ref 6.0–8.3)

## 2022-01-08 LAB — BASIC METABOLIC PANEL
BUN: 25 mg/dL — ABNORMAL HIGH (ref 6–23)
CO2: 28 mEq/L (ref 19–32)
Calcium: 9.1 mg/dL (ref 8.4–10.5)
Chloride: 101 mEq/L (ref 96–112)
Creatinine, Ser: 0.97 mg/dL (ref 0.40–1.20)
GFR: 50.25 mL/min — ABNORMAL LOW (ref >60.00)
Glucose, Bld: 94 mg/dL (ref 70–99)
Potassium: 4.8 mEq/L (ref 3.5–5.1)
Sodium: 136 mEq/L (ref 135–145)

## 2022-01-09 DIAGNOSIS — H35371 Puckering of macula, right eye: Secondary | ICD-10-CM | POA: Diagnosis not present

## 2022-01-09 DIAGNOSIS — H43392 Other vitreous opacities, left eye: Secondary | ICD-10-CM | POA: Diagnosis not present

## 2022-01-09 DIAGNOSIS — H43813 Vitreous degeneration, bilateral: Secondary | ICD-10-CM | POA: Diagnosis not present

## 2022-01-09 DIAGNOSIS — H3581 Retinal edema: Secondary | ICD-10-CM | POA: Diagnosis not present

## 2022-01-09 DIAGNOSIS — H353233 Exudative age-related macular degeneration, bilateral, with inactive scar: Secondary | ICD-10-CM | POA: Diagnosis not present

## 2022-01-30 ENCOUNTER — Telehealth: Payer: Self-pay

## 2022-01-30 NOTE — Telephone Encounter (Signed)
Pt daughter is calling to see if we have received the form from [place] where the pt will be placed.  They advise the daughter that it was faxed last week and they haven't heard back.  Pt daughter is asking for a callback with an update on the status of the completion.  Please advise

## 2022-02-05 NOTE — Telephone Encounter (Signed)
LM for pt to schedule OV to have forms completed

## 2022-02-05 NOTE — Telephone Encounter (Signed)
Forms retrieved. Cannot fully complete form until TB test is administered. Appt for TB test scheduled for Wednesday, reading scheduled for Friday.

## 2022-02-05 NOTE — Telephone Encounter (Signed)
Patient called wanting to know how much longer it is going to take to get her placement forms completed and if and when she had her last "TB Shot." Please return call at [phone].

## 2022-02-06 ENCOUNTER — Ambulatory Visit (INDEPENDENT_AMBULATORY_CARE_PROVIDER_SITE_OTHER): Payer: Medicare Other

## 2022-02-06 DIAGNOSIS — Z111 Encounter for screening for respiratory tuberculosis: Secondary | ICD-10-CM

## 2022-02-06 NOTE — Progress Notes (Signed)
PPD placement done today. Pt to return on Friday 02/08/22 for read.   Please co-sign.

## 2022-02-07 NOTE — Telephone Encounter (Signed)
Forms put aside until patient comes in for office visit.

## 2022-02-08 ENCOUNTER — Ambulatory Visit: Payer: Medicare Other

## 2022-02-08 LAB — TB SKIN TEST
Induration: 0 mm
TB Skin Test: NEGATIVE

## 2022-02-12 ENCOUNTER — Encounter: Payer: Self-pay | Admitting: Internal Medicine

## 2022-02-12 ENCOUNTER — Ambulatory Visit (INDEPENDENT_AMBULATORY_CARE_PROVIDER_SITE_OTHER): Payer: Medicare Other | Admitting: Internal Medicine

## 2022-02-12 VITALS — BP 140/76 | HR 81 | Temp 98.0°F | Wt 109.0 lb

## 2022-02-12 DIAGNOSIS — N1831 Chronic kidney disease, stage 3a: Secondary | ICD-10-CM

## 2022-02-12 DIAGNOSIS — R739 Hyperglycemia, unspecified: Secondary | ICD-10-CM | POA: Diagnosis not present

## 2022-02-12 DIAGNOSIS — Z022 Encounter for examination for admission to residential institution: Secondary | ICD-10-CM | POA: Diagnosis not present

## 2022-02-12 DIAGNOSIS — I10 Essential (primary) hypertension: Secondary | ICD-10-CM | POA: Diagnosis not present

## 2022-02-12 MED ORDER — FUROSEMIDE 20 MG PO TABS: 20.0000 mg | ORAL_TABLET | Freq: Every day | ORAL | 0 refills | Status: DC | PRN

## 2022-02-12 NOTE — Assessment & Plan Note (Signed)
BP Readings from Last 3 Encounters:  02/12/22 (!) 140/76  01/07/22 112/68  07/09/21 120/70   uncontrolled, pt to continue medical treatment cardizem CD 120 qd as declines change

## 2022-02-12 NOTE — Assessment & Plan Note (Signed)
>>  ASSESSMENT AND PLAN FOR HTN (HYPERTENSION) WRITTEN ON 02/12/2022  9:23 PM BY Corwin Levins, MD  BP Readings from Last 3 Encounters:  02/12/22 (!) 140/76  01/07/22 112/68  07/09/21 120/70   uncontrolled, pt to continue medical treatment cardizem CD 120 qd as declines change

## 2022-02-12 NOTE — Progress Notes (Signed)
Patient ID: Alexandra Reyes, female   DOB: 1925-11-19, 88 y.o.   MRN: [id number]        Chief Complaint: follow up HTN, hyperglycemia, ckd, assisted living forms to fill out       HPI:  Alexandra Reyes is a 88 y.o. female here overall doing ok, Pt denies chest pain, increased sob or doe, wheezing, orthopnea, PND, increased LE swelling, palpitations, dizziness or syncope.   Pt denies polydipsia, polyuria, or new focal neuro s/s.    Pt denies fever, wt loss, night sweats, loss of appetite, or other constitutional symptoms  Due to enter asissted living tomorrow, needs FL2 filled out.   BP at home < 140/90       Wt Readings from Last 3 Encounters:  02/12/22 109 lb (49.4 kg)  01/07/22 107 lb (48.5 kg)  07/09/21 105 lb (47.6 kg)   BP Readings from Last 3 Encounters:  02/12/22 (!) 140/76  01/07/22 112/68  07/09/21 120/70         Past Medical History:  Diagnosis Date   Age-related macular degeneration, wet, both eyes (HCC)    Alcohol dependence in remission (HCC) 05/22/2011   Allergic rhinitis, cause unspecified 05/22/2011   Allergy    Anemia, iron deficiency 05/18/2011   Cholelithiasis    COPD (chronic obstructive pulmonary disease) (HCC)    DDD (degenerative disc disease), lumbar 05/18/2011   Depression    "@ times" (09/19/2016)   First degree AV block    GERD (gastroesophageal reflux disease)    Hiatal hernia    History of blood transfusion    "3 or 4 related to childbirth"   HTN (hypertension) 05/18/2011   pt denies this hx on 09/19/2016   Hyperlipidemia 05/18/2011   Macular degeneration    Myocardial infarction (HCC)    "EKG shows I've had 2; date unknown" (09/19/2016)   OA (osteoarthritis)    "a little here and there; mainly in the back" (09/19/2016)   Osteoporosis 05/18/2011   PAT (paroxysmal atrial tachycardia) (HCC)    Pectus excavatum 05/18/2011   Personal history of colonic polyps 10/28/2012   Pneumonia    "as a child"   SVT (supraventricular tachycardia) (HCC)    Urinary  frequency    Past Surgical History:  Procedure Laterality Date   ABDOMINAL HYSTERECTOMY  1988   BREAST BIOPSY Right 1948   "it was ok"   CARDIAC CATHETERIZATION     CARDIOVASCULAR STRESS TEST  03/08/2009   EF 84%   CATARACT EXTRACTION W/ INTRAOCULAR LENS  IMPLANT, BILATERAL Bilateral    INGUINAL HERNIA REPAIR Right 06/23/2017   Procedure: REPAIR INCARCERATED  RIGHT FEMORAL HERNIA WITH MESH;  Surgeon: Harriette Bouillon, MD;  Location: [facility] OR;  Service: General;  Laterality: Right;   KNEE ARTHROSCOPY Right    REVERSE SHOULDER ARTHROPLASTY Left 09/05/2020   Procedure: REVERSE SHOULDER ARTHROPLASTY;  Surgeon: Yolonda Kida, MD;  Location: [facility] OR;  Service: Orthopedics;  Laterality: Left;   RIGHT/LEFT HEART CATH AND CORONARY ANGIOGRAPHY N/A 07/25/2016   Procedure: Right/Left Heart Cath and Coronary Angiography;  Surgeon: Kathleene Hazel, MD;  Location: [facility] INVASIVE CV LAB;  Service: Cardiovascular;  Laterality: N/A;   SVT ABLATION  09/19/2016   SVT ABLATION N/A 09/19/2016   Procedure: SVT Ablation;  Surgeon: Hillis Range, MD;  Location: [facility] INVASIVE CV LAB;  Service: Cardiovascular;  Laterality: N/A;   TONSILLECTOMY AND ADENOIDECTOMY     US ECHOCARDIOGRAPHY  01/17/2003   EF 55-60%    reports that she  quit smoking about 27 years ago. Her smoking use included cigarettes. She started smoking about 62 years ago. She has a 54.00 pack-year smoking history. She has never used smokeless tobacco. She reports that she does not drink alcohol and does not use drugs. family history includes Arthritis in her mother; Heart disease in her father; Heart failure in her mother; Hypertension in her father; Kidney failure in her father. Allergies  Allergen Reactions   Citalopram    Citalopram Hydrobromide Other (See Comments)   Oxycodone Other (See Comments)    hallucinations   Current Outpatient Medications on File Prior to Visit  Medication Sig Dispense Refill   acetaminophen (TYLENOL) 500 MG tablet  Take 1,000 mg by mouth 2 (two) times daily as needed for moderate pain or headache.     albuterol (VENTOLIN HFA) 108 (90 Base) MCG/ACT inhaler Inhale 2 puffs into the lungs every 6 (six) hours as needed for wheezing or shortness of breath. 8 g 6   ALPRAZolam (XANAX) 0.25 MG tablet Take 1 tablet (0.25 mg total) by mouth 2 (two) times daily as needed for anxiety. 60 tablet 5   Ascorbic Acid (VITAMIN C PO) Take 500 mg by mouth daily. Reported on 08/08/2015     aspirin 81 MG tablet Take 81 mg by mouth at bedtime.     B Complex Vitamins (VITAMIN B COMPLEX PO) Take 1 tablet by mouth daily.     BREO ELLIPTA 100-25 MCG/ACT AEPB INHALE 1 PUFF INTO THE LUNGS DAILY 60 each 2   buPROPion (WELLBUTRIN XL) 150 MG 24 hr tablet Take 1 tablet (150 mg total) by mouth daily. 90 tablet 3   calcium carbonate (OS-CAL) 600 MG tablet Take 600 mg by mouth daily.     cetirizine (ZYRTEC) 10 MG tablet Take 10 mg by mouth daily.     diltiazem (CARDIZEM CD) 120 MG 24 hr capsule TAKE ONE CAPSULE BY MOUTH DAILY 90 capsule 0   diltiazem (CARDIZEM) 30 MG tablet Take 1 tablet (30 mg total) by mouth 4 (four) times daily as needed. 60 tablet 11   Glucosamine HCl-MSM (GLUCOSAMINE-MSM PO) Take 2 tablets by mouth daily.      nitroGLYCERIN (NITROSTAT) 0.4 MG SL tablet Place 1 tablet (0.4 mg total) under the tongue every 5 (five) minutes as needed for chest pain. 25 tablet 3   omeprazole (PRILOSEC) 40 MG capsule TAKE ONE CAPSULE BY MOUTH DAILY 90 capsule 1   Respiratory Therapy Supplies (FLUTTER) DEVI Use as directed 1 each 0   simvastatin (ZOCOR) 10 MG tablet Take 1 tablet (10 mg total) by mouth daily. 90 tablet 3   traMADol (ULTRAM) 50 MG tablet TAKE ONE TABLET BY MOUTH TWICE A DAY AS NEEDED 60 tablet 2   No current facility-administered medications on file prior to visit.        ROS:  All others reviewed and negative.  Objective        PE:  BP (!) 140/76 (BP Location: Right Arm, Patient Position: Sitting, Cuff Size: Normal)    Pulse 81   Temp 98 F (36.7 C) (Oral)   Wt 109 lb (49.4 kg)   LMP  (LMP Unknown)   SpO2 94%   BMI 19.94 kg/m                 Constitutional: Pt appears in NAD               HENT: Head: NCAT.  Right Ear: External ear normal.                 Left Ear: External ear normal.                Eyes: . Pupils are equal, round, and reactive to light. Conjunctivae and EOM are normal               Nose: without d/c or deformity               Neck: Neck supple. Gross normal ROM               Cardiovascular: Normal rate and regular rhythm.                 Pulmonary/Chest: Effort normal and breath sounds without rales or wheezing.                Abd:  Soft, NT, ND, + BS, no organomegaly               Neurological: Pt is alert. At baseline orientation, motor grossly intact               Skin: Skin is warm. No rashes, no other new lesions, LE edema - none               Psychiatric: Pt behavior is normal without agitation   Micro: none  Cardiac tracings I have personally interpreted today:  none  Pertinent Radiological findings (summarize): none   Lab Results  Component Value Date   WBC 7.8 07/09/2021   HGB 14.8 07/09/2021   HCT 45.5 07/09/2021   PLT 349.0 07/09/2021   GLUCOSE 94 01/07/2022   CHOL 200 01/07/2022   TRIG 114.0 01/07/2022   HDL 88.60 01/07/2022   LDLDIRECT 129.2 05/15/2011   LDLCALC 88 01/07/2022   ALT 20 01/07/2022   AST 23 01/07/2022   NA 136 01/07/2022   K 4.8 01/07/2022   CL 101 01/07/2022   CREATININE 0.97 01/07/2022   BUN 25 (H) 01/07/2022   CO2 28 01/07/2022   TSH 2.29 07/09/2021   INR 1.0 07/17/2016   HGBA1C 6.1 01/07/2022   Assessment/Plan:  ALLETTA MATTOS is a 88 y.o. White or Caucasian [1] female with  has a past medical history of Age-related macular degeneration, wet, both eyes (HCC), Alcohol dependence in remission (HCC) (05/22/2011), Allergic rhinitis, cause unspecified (05/22/2011), Allergy, Anemia, iron deficiency (05/18/2011),  Cholelithiasis, COPD (chronic obstructive pulmonary disease) (HCC), DDD (degenerative disc disease), lumbar (05/18/2011), Depression, First degree AV block, GERD (gastroesophageal reflux disease), Hiatal hernia, History of blood transfusion, HTN (hypertension) (05/18/2011), Hyperlipidemia (05/18/2011), Macular degeneration, Myocardial infarction (HCC), OA (osteoarthritis), Osteoporosis (05/18/2011), PAT (paroxysmal atrial tachycardia) (HCC), Pectus excavatum (05/18/2011), Personal history of colonic polyps (10/28/2012), Pneumonia, SVT (supraventricular tachycardia) (HCC), and Urinary frequency.  CKD (chronic kidney disease), stage III (HCC) Lab Results  Component Value Date   CREATININE 0.97 01/07/2022   Stable overall, cont to avoid nephrotoxins   HTN (hypertension) BP Readings from Last 3 Encounters:  02/12/22 (!) 140/76  01/07/22 112/68  07/09/21 120/70   uncontrolled, pt to continue medical treatment cardizem CD 120 qd as declines change    Hyperglycemia Lab Results  Component Value Date   HGBA1C 6.1 01/07/2022   Stable, pt to continue current medical treatment  - diet, wt control, excercise   Encounter for examination for admission to nursing home FL2 form filled out and other admit forms  Followup: Return in about 6  months (around 08/13/2022).  Cathlean Cower, MD 02/12/2022 9:26 PM [facility]

## 2022-02-12 NOTE — Assessment & Plan Note (Signed)
FL2 form filled out and other admit forms

## 2022-02-12 NOTE — Assessment & Plan Note (Signed)
Lab Results  Component Value Date   CREATININE 0.97 01/07/2022   Stable overall, cont to avoid nephrotoxins

## 2022-02-12 NOTE — Assessment & Plan Note (Signed)
Lab Results  Component Value Date   HGBA1C 6.1 01/07/2022   Stable, pt to continue current medical treatment  - diet, wt control, excercise

## 2022-02-12 NOTE — Patient Instructions (Signed)
Your forms were filled out today  Please continue all other medications as before, and refills have been done if requested.  Please have the pharmacy call with any other refills you may need.  Please continue your efforts at being more active, low cholesterol diet, and weight control.  Please keep your appointments with your specialists as you may have planned

## 2022-02-15 NOTE — Telephone Encounter (Signed)
Done hardcopy to staff 

## 2022-02-15 NOTE — Telephone Encounter (Signed)
Pt daughter is calling back in stating that some information is needing to be added due to her not being able to have OTC meds in her room. Pt needs a stool softer and laxative for PRN use ordered.   Pt daughter reports that she is getting impacted.  Also [facility] faxed for Clarification on 9/6 for medications listed on FL2.  [facility] Fax [phone]  Please advise

## 2022-02-18 ENCOUNTER — Telehealth: Payer: Self-pay

## 2022-02-18 MED ORDER — TRAMADOL HCL 50 MG PO TABS: 50.0000 mg | ORAL_TABLET | Freq: Two times a day (BID) | ORAL | 2 refills | Status: DC | PRN

## 2022-02-18 NOTE — Telephone Encounter (Signed)
Faxed and confirmation was received.  Called to verify it was received at [facility].

## 2022-02-18 NOTE — Telephone Encounter (Signed)
Pt was advised that she needs a new prescription for  traMADol (ULTRAM) 50 MG tablet  Please fax to [facility] [phone]

## 2022-02-22 NOTE — Telephone Encounter (Signed)
Rx faxed

## 2022-03-03 ENCOUNTER — Other Ambulatory Visit (HOSPITAL_BASED_OUTPATIENT_CLINIC_OR_DEPARTMENT_OTHER): Payer: Self-pay | Admitting: Internal Medicine

## 2022-03-18 ENCOUNTER — Telehealth: Payer: Self-pay | Admitting: Internal Medicine

## 2022-03-18 NOTE — Telephone Encounter (Signed)
Alexandra Reyes needs a hard script sent in for her xanax - [place] [facility] - Fax [phone]

## 2022-03-19 NOTE — Telephone Encounter (Signed)
Alexandra Reyes has called to check the status of this medication. Please fax hard script to [facility]  Fax: [phone]  Phone: [phone]

## 2022-03-19 NOTE — Telephone Encounter (Signed)
I cannot as faxing prescriptions for benzodiazepines is illegal

## 2022-03-20 NOTE — Telephone Encounter (Signed)
No sorry, printing rx for benzodiazepine is illegal as well; all rx in [place] are required electronically to be sent

## 2022-03-20 NOTE — Telephone Encounter (Signed)
Informed Monique that we are unable to fax her request and that she may pick it up if you write a new script.

## 2022-03-24 DIAGNOSIS — Z23 Encounter for immunization: Secondary | ICD-10-CM | POA: Diagnosis not present

## 2022-04-02 DIAGNOSIS — E785 Hyperlipidemia, unspecified: Secondary | ICD-10-CM | POA: Diagnosis not present

## 2022-04-02 DIAGNOSIS — K219 Gastro-esophageal reflux disease without esophagitis: Secondary | ICD-10-CM | POA: Diagnosis not present

## 2022-04-02 DIAGNOSIS — K5902 Outlet dysfunction constipation: Secondary | ICD-10-CM | POA: Diagnosis not present

## 2022-04-02 DIAGNOSIS — M1991 Primary osteoarthritis, unspecified site: Secondary | ICD-10-CM | POA: Diagnosis not present

## 2022-04-02 DIAGNOSIS — I129 Hypertensive chronic kidney disease with stage 1 through stage 4 chronic kidney disease, or unspecified chronic kidney disease: Secondary | ICD-10-CM | POA: Diagnosis not present

## 2022-04-02 DIAGNOSIS — J449 Chronic obstructive pulmonary disease, unspecified: Secondary | ICD-10-CM | POA: Diagnosis not present

## 2022-04-02 DIAGNOSIS — N183 Chronic kidney disease, stage 3 unspecified: Secondary | ICD-10-CM | POA: Diagnosis not present

## 2022-04-09 DIAGNOSIS — E559 Vitamin D deficiency, unspecified: Secondary | ICD-10-CM | POA: Diagnosis not present

## 2022-04-09 DIAGNOSIS — Z79899 Other long term (current) drug therapy: Secondary | ICD-10-CM | POA: Diagnosis not present

## 2022-04-10 ENCOUNTER — Telehealth: Payer: Self-pay | Admitting: Internal Medicine

## 2022-04-10 DIAGNOSIS — K623 Rectal prolapse: Secondary | ICD-10-CM | POA: Diagnosis not present

## 2022-04-10 DIAGNOSIS — E782 Mixed hyperlipidemia: Secondary | ICD-10-CM | POA: Diagnosis not present

## 2022-04-10 DIAGNOSIS — J449 Chronic obstructive pulmonary disease, unspecified: Secondary | ICD-10-CM | POA: Diagnosis not present

## 2022-04-10 DIAGNOSIS — I129 Hypertensive chronic kidney disease with stage 1 through stage 4 chronic kidney disease, or unspecified chronic kidney disease: Secondary | ICD-10-CM | POA: Diagnosis not present

## 2022-04-10 DIAGNOSIS — N1831 Chronic kidney disease, stage 3a: Secondary | ICD-10-CM | POA: Diagnosis not present

## 2022-04-10 DIAGNOSIS — K582 Mixed irritable bowel syndrome: Secondary | ICD-10-CM | POA: Diagnosis not present

## 2022-04-10 NOTE — Telephone Encounter (Signed)
Left message for patient to call back to schedule Medicare Annual Wellness Visit   Last AWV  12/31/19  Please schedule at anytime with [facility]-Nurse Health Advisor if patient calls the office back.     Any questions, please call me at [phone]  

## 2022-04-30 DIAGNOSIS — I129 Hypertensive chronic kidney disease with stage 1 through stage 4 chronic kidney disease, or unspecified chronic kidney disease: Secondary | ICD-10-CM | POA: Diagnosis not present

## 2022-04-30 DIAGNOSIS — N1831 Chronic kidney disease, stage 3a: Secondary | ICD-10-CM | POA: Diagnosis not present

## 2022-04-30 DIAGNOSIS — J449 Chronic obstructive pulmonary disease, unspecified: Secondary | ICD-10-CM | POA: Diagnosis not present

## 2022-04-30 DIAGNOSIS — E782 Mixed hyperlipidemia: Secondary | ICD-10-CM | POA: Diagnosis not present

## 2022-04-30 DIAGNOSIS — M1991 Primary osteoarthritis, unspecified site: Secondary | ICD-10-CM | POA: Diagnosis not present

## 2022-04-30 DIAGNOSIS — K219 Gastro-esophageal reflux disease without esophagitis: Secondary | ICD-10-CM | POA: Diagnosis not present

## 2022-05-07 ENCOUNTER — Ambulatory Visit: Payer: Medicare Other | Attending: Cardiology | Admitting: Cardiology

## 2022-05-07 ENCOUNTER — Encounter: Payer: Self-pay | Admitting: Cardiology

## 2022-05-07 VITALS — BP 120/70 | HR 86 | Ht 62.0 in | Wt 114.0 lb

## 2022-05-07 DIAGNOSIS — I10 Essential (primary) hypertension: Secondary | ICD-10-CM | POA: Insufficient documentation

## 2022-05-07 DIAGNOSIS — I480 Paroxysmal atrial fibrillation: Secondary | ICD-10-CM | POA: Insufficient documentation

## 2022-05-07 MED ORDER — APIXABAN 2.5 MG PO TABS: 2.5000 mg | ORAL_TABLET | Freq: Two times a day (BID) | ORAL | 3 refills | Status: DC

## 2022-05-07 MED ORDER — APIXABAN 2.5 MG PO TABS: 2.5000 mg | ORAL_TABLET | Freq: Two times a day (BID) | ORAL | 6 refills | Status: DC

## 2022-05-07 NOTE — Progress Notes (Signed)
Cardiology Office Note:    Date:  05/10/2022   ID:  Khyrie, Doleman April 23, 1926, MRN [id number]  PCP:  Biagio Borg, MD  [facility] Cardiologist:  Candee Furbish, MD  [facility] Electrophysiologist:  Thompson Grayer, MD   Referring MD: Biagio Borg, MD   History of Present Illness:    Alexandra Reyes is a 88 y.o. female here for the follow-up of coronary artery disease, hypertension, and hyperlipidemia. SVT ablation performed 09/2016.  She followed up with Ermalinda Barrios, PA-C on 02/06/2021. Blood pressure was 136/74. Her PCP discontinued amlodipine, and for a week she experienced racing heart beats and high diastolic BP. She had been wearing compression hose and her swelling had improved. She reported unchanged chronic chest pain and dyspnea.  Last saw Dr. Rayann Heman on 12/13/2020. Her blood pressure was 142/74. She reported a fall in 08/2020 resulting in a shoulder injury that required surgery. She was not feeling well and was losing weight, had a poor appetite. Also had recently fallen to the floor due to weakness. Her venous insufficiency was noted to be worse without wearing compression hose. EKG showed sinus rhythm with incomplete LBBB. Lasix was reduced to 20 mg daily.  Saw Dr. Rayann Heman on 12/01/2019, office note reviewed.  She was doing well at that time.  Chest pain was atypical and symptoms were stable.  No symptoms of palpitations.  Came to [place] from [place] to help treat the polio epidemic. Went to [employer] in [place].  At her last appointment she had been doing quite well.  She likes to stay on the Cardizem.  When she came off of it at 1 point she started to have tachy arrhythmias again.  At her last appointment she reported that she was doing well overall. She reported that she had recovered well from her shoulder arthroplasty. Since amlodipine was discontinued, she denied having any more syncopal episodes. Her primary concern at that time were  intermittent diarrhea and nausea, with subsequent loss of appetite and weight loss. When her appetite improved her symptoms of diarrhea and nausea return. She had started drinking Ensure protein drinks which seemed to help.   She was still followed by pulmonology concerning her bronchiectasis and COPD at that time.   She did report that her brother had recently passed away.   Today, she is accompanied by a friend. She has just moved into an assisted living community; [facility]. She is trying to adjust to the change.She was having difficulty being alone at home so much, she likes being around people. Overall she is feeling well.   She denies any palpitations, chest pain, shortness of breath, or peripheral edema. No lightheadedness, headaches, syncope, orthopnea, or PND.    Past Medical History:  Diagnosis Date   Age-related macular degeneration, wet, both eyes ([name])    Alcohol dependence in remission ([place]) 05/22/2011   Allergic rhinitis, cause unspecified 05/22/2011   Allergy    Anemia, iron deficiency 05/18/2011   Cholelithiasis    COPD (chronic obstructive pulmonary disease) (HCC)    DDD (degenerative disc disease), lumbar 05/18/2011   Depression    "@ times" (09/19/2016)   First degree AV block    GERD (gastroesophageal reflux disease)    Hiatal hernia    History of blood transfusion    "3 or 4 related to childbirth"   HTN (hypertension) 05/18/2011   pt denies this hx on 09/19/2016   Hyperlipidemia 05/18/2011   Macular degeneration  Myocardial infarction (HCC)    "EKG shows I've had 2; date unknown" (09/19/2016)   OA (osteoarthritis)    "a little here and there; mainly in the back" (09/19/2016)   Osteoporosis 05/18/2011   PAT (paroxysmal atrial tachycardia)    Pectus excavatum 05/18/2011   Personal history of colonic polyps 10/28/2012   Pneumonia    "as a child"   SVT (supraventricular tachycardia)    Urinary frequency     Past Surgical History:  Procedure Laterality Date    ABDOMINAL HYSTERECTOMY  1988   BREAST BIOPSY Right 1948   "it was ok"   CARDIAC CATHETERIZATION     CARDIOVASCULAR STRESS TEST  03/08/2009   EF 84%   CATARACT EXTRACTION W/ INTRAOCULAR LENS  IMPLANT, BILATERAL Bilateral    INGUINAL HERNIA REPAIR Right 06/23/2017   Procedure: REPAIR INCARCERATED  RIGHT FEMORAL HERNIA WITH MESH;  Surgeon: Harriette Bouillon, MD;  Location: [facility] OR;  Service: General;  Laterality: Right;   KNEE ARTHROSCOPY Right    REVERSE SHOULDER ARTHROPLASTY Left 09/05/2020   Procedure: REVERSE SHOULDER ARTHROPLASTY;  Surgeon: Yolonda Kida, MD;  Location: [facility] OR;  Service: Orthopedics;  Laterality: Left;   RIGHT/LEFT HEART CATH AND CORONARY ANGIOGRAPHY N/A 07/25/2016   Procedure: Right/Left Heart Cath and Coronary Angiography;  Surgeon: Kathleene Hazel, MD;  [facility]VAS[facility] LAB;  Service: Cardiovascular;  Laterality: N/A;   SVT ABLATION  09/19/2016   SVT ABLATION N/A 09/19/2016   Procedure: SVT Ablation;  Surgeon: Hillis Range, MD;  Location: [facility] INVASIVE CV LAB;  Service: Cardiovascular;  Laterality: N/A;   TONSILLECTOMY AND ADENOIDECTOMY     US ECHOCARDIOGRAPHY  01/17/2003   EF 55-60%    Current Medications: Current Meds  Medication Sig   acetaminophen (TYLENOL) 500 MG tablet Take 1,000 mg by mouth 2 (two) times daily as needed for moderate pain or headache.   albuterol (VENTOLIN HFA) 108 (90 Base) MCG/ACT inhaler Inhale 2 puffs into the lungs every 6 (six) hours as needed for wheezing or shortness of breath.   ALPRAZolam (XANAX) 0.25 MG tablet Take 1 tablet (0.25 mg total) by mouth 2 (two) times daily as needed for anxiety.   Ascorbic Acid (VITAMIN C PO) Take 500 mg by mouth daily. Reported on 08/08/2015   B Complex Vitamins (VITAMIN B COMPLEX PO) Take 1 tablet by mouth daily.   BREO ELLIPTA 100-25 MCG/ACT AEPB INHALE 1 PUFF INTO THE LUNGS DAILY   buPROPion (WELLBUTRIN XL) 150 MG 24 hr tablet Take 1 tablet (150 mg total) by mouth daily.   calcium  carbonate (OS-CAL) 600 MG tablet Take 600 mg by mouth daily.   cetirizine (ZYRTEC) 10 MG tablet Take 10 mg by mouth daily.   diltiazem (CARDIZEM CD) 120 MG 24 hr capsule Take 1 capsule (120 mg total) by mouth daily.   diltiazem (CARDIZEM) 30 MG tablet Take 1 tablet (30 mg total) by mouth 4 (four) times daily as needed.   furosemide (LASIX) 20 MG tablet Take 1 tablet (20 mg total) by mouth daily as needed.   Glucosamine HCl-MSM (GLUCOSAMINE-MSM PO) Take 2 tablets by mouth daily.    nitroGLYCERIN (NITROSTAT) 0.4 MG SL tablet Place 1 tablet (0.4 mg total) under the tongue every 5 (five) minutes as needed for chest pain.   omeprazole (PRILOSEC) 40 MG capsule TAKE ONE CAPSULE BY MOUTH DAILY   Respiratory Therapy Supplies (FLUTTER) DEVI Use as directed   simvastatin (ZOCOR) 10 MG tablet Take 1 tablet (10 mg total) by mouth daily.  traMADol (ULTRAM) 50 MG tablet Take 1 tablet (50 mg total) by mouth 2 (two) times daily as needed.   [DISCONTINUED] apixaban (ELIQUIS) 2.5 MG TABS tablet Take 1 tablet (2.5 mg total) by mouth 2 (two) times daily.   [DISCONTINUED] aspirin 81 MG tablet Take 81 mg by mouth at bedtime.     Allergies:   Citalopram, Citalopram hydrobromide, and Oxycodone   Social History   Socioeconomic History   Marital status: Widowed    Spouse name: Not on file   Number of children: 2   Years of education: 22   Highest education level: Not on file  Occupational History   Occupation: Retired Equities trader  Tobacco Use   Smoking status: Former    Packs/day: 1.50    Years: 36.00    Total pack years: 54.00    Types: Cigarettes    Start date: 03/01/1959    Quit date: 06/10/1994    Years since quitting: 27.9   Smokeless tobacco: Never  Vaping Use   Vaping Use: Never used  Substance and Sexual Activity   Alcohol use: No   Drug use: No   Sexual activity: Never  Other Topics Concern   Not on file  Social History Narrative   Originally from [place], [place]. She moved to [place] in  1950 during the polio epidemic. Previously worked as a Marine scientist. No pets currently. Remote bird exposure. No mold or hot tub exposure.    Social Determinants of Health   Financial Resource Strain: Low Risk  (12/31/2019)   Overall Financial Resource Strain (CARDIA)    Difficulty of Paying Living Expenses: Not hard at all  Food Insecurity: No Food Insecurity (12/31/2019)   Hunger Vital Sign    Worried About Running Out of Food in the Last Year: Never true    Ran Out of Food in the Last Year: Never true  Transportation Needs: No Transportation Needs (12/31/2019)   PRAPARE - Hydrologist (Medical): No    Lack of Transportation (Non-Medical): No  Physical Activity: Inactive (12/29/2017)   Exercise Vital Sign    Days of Exercise per Week: 0 days    Minutes of Exercise per Session: 0 min  Stress: No Stress Concern Present (12/31/2019)   [place]    Feeling of Stress : Not at all  Social Connections: Moderately Integrated (12/29/2017)   Social Connection and Isolation Panel [NHANES]    Frequency of Communication with Friends and Family: More than three times a week    Frequency of Social Gatherings with Friends and Family: More than three times a week    Attends Religious Services: More than 4 times per year    Active Member of [employer] or Organizations: Yes    Attends Archivist Meetings: More than 4 times per year    Marital Status: Widowed     Family History: The patient's family history includes Arthritis in her mother; Heart disease in her father; Heart failure in her mother; Hypertension in her father; Kidney failure in her father.  ROS:   Please see the history of present illness.     All other systems reviewed and are negative.  EKGs/Labs/Other Studies Reviewed:    The following studies were reviewed today:  CT Abdomen/Pelvis 03/27/2021: IMPRESSION: 1. No acute findings are  noted in the abdomen or pelvis to account for the patient's symptoms. 2. Small simple appearing cyst in the left adnexal region, likely  an ovarian cyst. No follow-up imaging recommended. Note: This recommendation does not apply to premenarchal patients and to those with increased risk (genetic, family history, elevated tumor markers or other high-risk factors) of ovarian cancer. Reference: JACR 2020 Feb; 17(2):248-254. 3. Worsening areas of fibrosis in the lung bases concerning for progressive interstitial lung disease. Further characterization with nonemergent high-resolution chest CT is suggested in the near future. Additionally, outpatient referral to Pulmonology for further clinical evaluation is recommended. 4. There are calcifications of the aortic valve. Echocardiographic correlation for evaluation of potential valvular dysfunction may be warranted if clinically indicated. 5. Aortic atherosclerosis, in addition to at least right coronary artery disease.  Echo 01/03/2021: Sonographer Comments: Technically difficult to position patient.  IMPRESSIONS    1. Left ventricular ejection fraction, by estimation, is 55 to 60%. The  left ventricle has normal function. The left ventricle has no regional  wall motion abnormalities. Left ventricular diastolic parameters are  consistent with Grade I diastolic  dysfunction (impaired relaxation).   2. Right ventricular systolic function is normal. The right ventricular  size is normal. There is normal pulmonary artery systolic pressure.   3. The mitral valve is grossly normal. No evidence of mitral valve  regurgitation. No evidence of mitral stenosis.   4. The aortic valve is tricuspid. There is mild calcification of the  aortic valve. There is mild thickening of the aortic valve. Aortic valve  regurgitation is not visualized. Aortic valve sclerosis is present, with  no evidence of aortic valve stenosis.   5. Aortic dilatation noted. There  is borderline dilatation of the  ascending aorta, measuring 39 mm.   Comparison(s): A prior study was performed on 07/01/2015. Prior images  unable to be directly viewed, comparison made by report only. Similar to  prior report.   CT Chest 09/03/2020: COMPARISON:  12/28/2018   FINDINGS: Cardiovascular: Thoracic aorta and its branches demonstrate diffuse atherosclerotic calcifications. No aneurysmal dilatation is identified. Coronary calcifications are seen. Pulmonary artery is not significantly enlarged.   Mediastinum/Nodes: Thoracic inlet is within normal limits with the exception of a 2 cm hypodense nodule within the right lobe of the thyroid bridging into the isthmus. No sizable hilar or mediastinal adenopathy is noted. No esophageal abnormality is seen.   Lungs/Pleura: Lungs are well aerated bilaterally. No focal infiltrate or sizable effusion is seen. Minimal right basilar atelectasis is noted. No sizable effusion or pneumothorax is seen.   Upper Abdomen: Renal cystic change is noted and stable from the prior exam. Remainder of the upper abdomen appears within normal limits.   Musculoskeletal: Compression deformity is noted at T2 which is new from the prior exam of [id number] but of uncertain chronicity. No other compression deformities are seen. Sternum is unremarkable. Comminuted proximal left humeral fracture is again seen stable from prior plain films. No acute rib fracture is noted.   IMPRESSION: Stable left humeral fracture.   No definitive rib abnormality is seen.   Chronic appearing T2 compression fracture. This is new from prior exam of [id number] but of uncertain chronicity. Correlation to point tenderness is recommended.   2 cm hypodense nodule within the right lobe of the thyroid. This is stable from a prior exam of 2020 and given the patient's age no follow-up recommended unless clinically warranted (ref: J Am Coll Radiol. 2015 Feb;12(2): 143-50).   Aortic  Atherosclerosis (ICD10-I70.0).  Monitor 02/2017: Sinus rhythm Symptomatic episodes correspond to SVT.  This is a mid RP SVT and may represent ectopic atrial tachycardia, though reentrant  arrhythmia cannot be excluded.  SVT Ablation 09/19/2016: CONCLUSIONS:  1. Sinus rhythm upon presentation.  2. The patient had dual AV nodal physiology with easily inducible and incessant classic AV nodal reentrant tachycardia, there were no other accessory pathways or arrhythmias induced  3. Successful radiofrequency modification of the slow AV nodal pathway  4. No inducible arrhythmias following ablation though the patient did have multifocal premature atrial contractiosn 5. No early apparent complications.   Cath 2018: Prox RCA lesion, 100 %stenosed. Prox LAD lesion, 20 %stenosed. LV end diastolic pressure is normal.   1. 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 The mid and distal vessel fills briskly from left to right collaterals.  2. Mild non-obstructive disease in the LAD 3. Normal filling pressures.  4. Normal PA pressures  Diagnostic Dominance: Right  Intervention    EKG: EKG is personally reviewed and interpreted. 05/07/2022: Atrial fibrillation 05/07/2021: EKG was not ordered. 12/13/2020 (Dr. Rayann Heman): Sinus rhythm, incomplete LBBB.  Recent Labs: 07/09/2021: Hemoglobin 14.8; Platelets 349.0; TSH 2.29 01/07/2022: ALT 20; BUN 25; Creatinine, Ser 0.97; Potassium 4.8; Sodium 136   Recent Lipid Panel    Component Value Date/Time   CHOL 200 01/07/2022 1504   CHOL 160 09/30/2016 1205   TRIG 114.0 01/07/2022 1504   HDL 88.60 01/07/2022 1504   HDL 73 09/30/2016 1205   CHOLHDL 2 01/07/2022 1504   VLDL 22.8 01/07/2022 1504   LDLCALC 88 01/07/2022 1504   LDLCALC 69 09/30/2016 1205   LDLDIRECT 129.2 05/15/2011 0925     Risk Assessment/Calculations:       Physical Exam:    VS:  BP 120/70 (BP Location: Left Arm, Patient Position: Sitting, Cuff Size: Normal)   Pulse 86   Ht 5'  2" (1.575 m)   Wt 114 lb (51.7 kg)   LMP  (LMP Unknown)   BMI 20.85 kg/m     Wt Readings from Last 3 Encounters:  05/07/22 114 lb (51.7 kg)  02/12/22 109 lb (49.4 kg)  01/07/22 107 lb (48.5 kg)     GEN:  Well nourished, well developed in no acute distress HEENT: Normal NECK: No JVD; No carotid bruits LYMPHATICS: No lymphadenopathy CARDIAC:  Irregularly irregular, no murmurs, rubs, gallops RESPIRATORY:  Clear to auscultation without rales, wheezing or rhonchi  ABDOMEN: Soft, non-tender, non-distended MUSCULOSKELETAL:  No edema; No deformity  SKIN: Warm and dry NEUROLOGIC:  Alert and oriented x 3 PSYCHIATRIC:  Normal affect   ASSESSMENT:    1. PAF (paroxysmal atrial fibrillation) ([place])   2. Essential hypertension      PLAN:    In order of problems listed above:  Paroxysmal atrial fibrillation - Newly discovered today.  Heart rate is 86 bpm has a left bundle branch block as well.  Continue with Cardizem. - We will stop her aspirin 81 mg and start Eliquis 2.5 mg twice a day dose adjusted for her age and weight. -Continue to monitor her closely  She lives at [facility], we will give her a written prescription.   CAD with CTO of the RCA on cath in 2018 medical therapy history of atypical chest pain felt secondary to costochondritis, no changes in her symptoms. Continue current treatment   History of AVNRT ablation by Dr. Larena Sox   Prior syncope felt secondary to dehydration 2D echo showed normal LVEF 55 to 60% with grade 1 DD no significant valvular disease.  See above for details. Amlodipine stopped and now wearing compression hose.   Hypertension-BP stable off meds. Can reduce lasix  to prn if needed in future.   Hyperlipidemia-LDL 81.    Shared Decision Making/Informed Consent      Follow-up: 4 month  Medication Adjustments/Labs and Tests Ordered: Current medicines are reviewed at length with the patient today.  Concerns regarding medicines are outlined  above.   Orders Placed This Encounter  Procedures   EKG 12-Lead    Meds ordered this encounter  Medications   DISCONTD: apixaban (ELIQUIS) 2.5 MG TABS tablet    Sig: Take 1 tablet (2.5 mg total) by mouth 2 (two) times daily.    Dispense:  60 tablet    Refill:  3   apixaban (ELIQUIS) 2.5 MG TABS tablet    Sig: Take 1 tablet (2.5 mg total) by mouth 2 (two) times daily.    Dispense:  60 tablet    Refill:  6    Patient Instructions  Medication Instructions:  Your physician has recommended you make the following change in your medication: Stop taking aspirin 81 mg. Start taking eliquis 2.5 mg BID.  *If you need a refill on your cardiac medications before your next appointment, please call your pharmacy*   Lab Work: None. If you have labs (blood work) drawn today and your tests are completely normal, you will receive your results only by: [place] (if you have MyChart) OR A paper copy in the mail If you have any lab test that is abnormal or we need to change your treatment, we will call you to review the results.   Testing/Procedures: None.   Follow-Up: At [facility], you and your health needs are our priority.  As part of our continuing mission to provide you with exceptional heart care, we have created designated Provider Care Teams.  These Care Teams include your primary Cardiologist (physician) and Advanced Practice Providers (APPs -  Physician Assistants and Nurse Practitioners) who all work together to provide you with the care you need, when you need it.  We recommend signing up for the patient portal called "MyChart".  Sign up information is provided on this After Visit Summary.  MyChart is used to connect with patients for Virtual Visits (Telemedicine).  Patients are able to view lab/test results, encounter notes, upcoming appointments, etc.  Non-urgent messages can be sent to your provider as well.   To learn more about what you can do with MyChart, go  to [email or web address].     Important Information About Sugar          I,Jessica Ford,acting as a scribe for [employer], MD.,have documented all relevant documentation on the behalf of Candee Furbish, MD,as directed by  Candee Furbish, MD while in the presence of Candee Furbish, MD.   I, Candee Furbish, MD, have reviewed all documentation for this visit. The documentation on 05/10/22 for the exam, diagnosis, procedures, and orders are all accurate and complete.    Signed, Candee Furbish, MD  05/10/2022 7:00 AM    [facility]

## 2022-05-07 NOTE — Patient Instructions (Signed)
Medication Instructions:  Your physician has recommended you make the following change in your medication: Stop taking aspirin 81 mg. Start taking eliquis 2.5 mg BID.  *If you need a refill on your cardiac medications before your next appointment, please call your pharmacy*   Lab Work: None. If you have labs (blood work) drawn today and your tests are completely normal, you will receive your results only by: MyChart Message (if you have MyChart) OR A paper copy in the mail If you have any lab test that is abnormal or we need to change your treatment, we will call you to review the results.   Testing/Procedures: None.   Follow-Up: At [facility], you and your health needs are our priority.  As part of our continuing mission to provide you with exceptional heart care, we have created designated Provider Care Teams.  These Care Teams include your primary Cardiologist (physician) and Advanced Practice Providers (APPs -  Physician Assistants and Nurse Practitioners) who all work together to provide you with the care you need, when you need it.  We recommend signing up for the patient portal called "MyChart".  Sign up information is provided on this After Visit Summary.  MyChart is used to connect with patients for Virtual Visits (Telemedicine).  Patients are able to view lab/test results, encounter notes, upcoming appointments, etc.  Non-urgent messages can be sent to your provider as well.   To learn more about what you can do with MyChart, go to [email or web address].     Important Information About Sugar

## 2022-06-05 ENCOUNTER — Telehealth: Payer: Self-pay | Admitting: Internal Medicine

## 2022-06-05 NOTE — Telephone Encounter (Signed)
error 

## 2022-06-11 DIAGNOSIS — E782 Mixed hyperlipidemia: Secondary | ICD-10-CM | POA: Diagnosis not present

## 2022-06-11 DIAGNOSIS — K219 Gastro-esophageal reflux disease without esophagitis: Secondary | ICD-10-CM | POA: Diagnosis not present

## 2022-06-11 DIAGNOSIS — N1831 Chronic kidney disease, stage 3a: Secondary | ICD-10-CM | POA: Diagnosis not present

## 2022-06-11 DIAGNOSIS — J449 Chronic obstructive pulmonary disease, unspecified: Secondary | ICD-10-CM | POA: Diagnosis not present

## 2022-06-11 DIAGNOSIS — I129 Hypertensive chronic kidney disease with stage 1 through stage 4 chronic kidney disease, or unspecified chronic kidney disease: Secondary | ICD-10-CM | POA: Diagnosis not present

## 2022-06-14 ENCOUNTER — Telehealth: Payer: Self-pay | Admitting: Internal Medicine

## 2022-06-14 DIAGNOSIS — E538 Deficiency of other specified B group vitamins: Secondary | ICD-10-CM

## 2022-06-14 DIAGNOSIS — R739 Hyperglycemia, unspecified: Secondary | ICD-10-CM

## 2022-06-14 DIAGNOSIS — E559 Vitamin D deficiency, unspecified: Secondary | ICD-10-CM

## 2022-06-14 DIAGNOSIS — E7800 Pure hypercholesterolemia, unspecified: Secondary | ICD-10-CM

## 2022-06-14 NOTE — Telephone Encounter (Signed)
Pt has 6 month visit with PCP 2/5, lab appt 1/31.  Please enter lab orders.

## 2022-06-14 NOTE — Telephone Encounter (Signed)
Ok labs are in 

## 2022-07-03 DIAGNOSIS — H353222 Exudative age-related macular degeneration, left eye, with inactive choroidal neovascularization: Secondary | ICD-10-CM | POA: Diagnosis not present

## 2022-07-03 DIAGNOSIS — H353134 Nonexudative age-related macular degeneration, bilateral, advanced atrophic with subfoveal involvement: Secondary | ICD-10-CM | POA: Diagnosis not present

## 2022-07-03 DIAGNOSIS — H04123 Dry eye syndrome of bilateral lacrimal glands: Secondary | ICD-10-CM | POA: Diagnosis not present

## 2022-07-03 DIAGNOSIS — H43813 Vitreous degeneration, bilateral: Secondary | ICD-10-CM | POA: Diagnosis not present

## 2022-07-03 DIAGNOSIS — H3581 Retinal edema: Secondary | ICD-10-CM | POA: Diagnosis not present

## 2022-07-03 DIAGNOSIS — H35371 Puckering of macula, right eye: Secondary | ICD-10-CM | POA: Diagnosis not present

## 2022-07-03 DIAGNOSIS — H43392 Other vitreous opacities, left eye: Secondary | ICD-10-CM | POA: Diagnosis not present

## 2022-07-03 DIAGNOSIS — H353213 Exudative age-related macular degeneration, right eye, with inactive scar: Secondary | ICD-10-CM | POA: Diagnosis not present

## 2022-07-10 ENCOUNTER — Other Ambulatory Visit (INDEPENDENT_AMBULATORY_CARE_PROVIDER_SITE_OTHER): Payer: Medicare Other

## 2022-07-10 DIAGNOSIS — E538 Deficiency of other specified B group vitamins: Secondary | ICD-10-CM

## 2022-07-10 DIAGNOSIS — E7800 Pure hypercholesterolemia, unspecified: Secondary | ICD-10-CM

## 2022-07-10 DIAGNOSIS — R739 Hyperglycemia, unspecified: Secondary | ICD-10-CM | POA: Diagnosis not present

## 2022-07-10 DIAGNOSIS — E559 Vitamin D deficiency, unspecified: Secondary | ICD-10-CM | POA: Diagnosis not present

## 2022-07-10 LAB — CBC WITH DIFFERENTIAL/PLATELET
Basophils Absolute: 0 10*3/uL (ref 0.0–0.1)
Basophils Relative: 0.5 % (ref 0.0–3.0)
Eosinophils Absolute: 0.1 10*3/uL (ref 0.0–0.7)
Eosinophils Relative: 1.4 % (ref 0.0–5.0)
HCT: 38.1 % (ref 36.0–46.0)
Hemoglobin: 12.8 g/dL (ref 12.0–15.0)
Lymphocytes Relative: 12.3 % (ref 12.0–46.0)
Lymphs Abs: 1 10*3/uL (ref 0.7–4.0)
MCHC: 33.5 g/dL (ref 30.0–36.0)
MCV: 92.7 fl (ref 78.0–100.0)
Monocytes Absolute: 0.7 10*3/uL (ref 0.1–1.0)
Monocytes Relative: 9.5 % (ref 3.0–12.0)
Neutro Abs: 6 10*3/uL (ref 1.4–7.7)
Neutrophils Relative %: 76.3 % (ref 43.0–77.0)
Platelets: 334 10*3/uL (ref 150.0–400.0)
RBC: 4.11 Mil/uL (ref 3.87–5.11)
RDW: 14.4 % (ref 11.5–15.5)
WBC: 7.9 10*3/uL (ref 4.0–10.5)

## 2022-07-10 LAB — HEMOGLOBIN A1C: Hgb A1c MFr Bld: 5.7 % (ref 4.6–6.5)

## 2022-07-10 LAB — LIPID PANEL
Cholesterol: 194 mg/dL (ref 0–200)
HDL: 82.5 mg/dL (ref >39.00)
LDL Cholesterol: 94 mg/dL (ref 0–99)
NonHDL: 111.51
Total CHOL/HDL Ratio: 2
Triglycerides: 90 mg/dL (ref 0.0–149.0)
VLDL: 18 mg/dL (ref 0.0–40.0)

## 2022-07-10 LAB — HEPATIC FUNCTION PANEL
ALT: 15 U/L (ref 0–35)
AST: 18 U/L (ref 0–37)
Albumin: 4.2 g/dL (ref 3.5–5.2)
Alkaline Phosphatase: 66 U/L (ref 39–117)
Bilirubin, Direct: 0.1 mg/dL (ref 0.0–0.3)
Total Bilirubin: 0.4 mg/dL (ref 0.2–1.2)
Total Protein: 6.2 g/dL (ref 6.0–8.3)

## 2022-07-10 LAB — BASIC METABOLIC PANEL
BUN: 22 mg/dL (ref 6–23)
CO2: 22 mEq/L (ref 19–32)
Calcium: 8.7 mg/dL (ref 8.4–10.5)
Chloride: 100 mEq/L (ref 96–112)
Creatinine, Ser: 1.16 mg/dL (ref 0.40–1.20)
GFR: 40.4 mL/min — ABNORMAL LOW (ref >60.00)
Glucose, Bld: 132 mg/dL — ABNORMAL HIGH (ref 70–99)
Potassium: 4.2 mEq/L (ref 3.5–5.1)
Sodium: 131 mEq/L — ABNORMAL LOW (ref 135–145)

## 2022-07-10 LAB — VITAMIN D 25 HYDROXY (VIT D DEFICIENCY, FRACTURES): VITD: 34.92 ng/mL (ref 30.00–100.00)

## 2022-07-10 LAB — VITAMIN B12: Vitamin B-12: 350 pg/mL (ref 211–911)

## 2022-07-10 LAB — TSH: TSH: 1.52 u[IU]/mL (ref 0.35–5.50)

## 2022-07-15 ENCOUNTER — Other Ambulatory Visit: Payer: Self-pay

## 2022-07-15 ENCOUNTER — Ambulatory Visit (INDEPENDENT_AMBULATORY_CARE_PROVIDER_SITE_OTHER): Payer: Medicare Other | Admitting: Internal Medicine

## 2022-07-15 VITALS — BP 136/84 | HR 72 | Temp 97.6°F | Ht 62.0 in | Wt 116.0 lb

## 2022-07-15 DIAGNOSIS — Z0001 Encounter for general adult medical examination with abnormal findings: Secondary | ICD-10-CM

## 2022-07-15 DIAGNOSIS — I10 Essential (primary) hypertension: Secondary | ICD-10-CM

## 2022-07-15 DIAGNOSIS — R739 Hyperglycemia, unspecified: Secondary | ICD-10-CM

## 2022-07-15 DIAGNOSIS — E559 Vitamin D deficiency, unspecified: Secondary | ICD-10-CM | POA: Diagnosis not present

## 2022-07-15 DIAGNOSIS — N1831 Chronic kidney disease, stage 3a: Secondary | ICD-10-CM

## 2022-07-15 DIAGNOSIS — E7800 Pure hypercholesterolemia, unspecified: Secondary | ICD-10-CM

## 2022-07-15 LAB — URINALYSIS, ROUTINE W REFLEX MICROSCOPIC
Bilirubin Urine: NEGATIVE
Hgb urine dipstick: NEGATIVE
Ketones, ur: NEGATIVE
Nitrite: NEGATIVE
RBC / HPF: NONE SEEN (ref 0–?)
Specific Gravity, Urine: 1.01 (ref 1.000–1.030)
Total Protein, Urine: NEGATIVE
Urine Glucose: NEGATIVE
Urobilinogen, UA: 0.2 (ref 0.0–1.0)
pH: 6.5 (ref 5.0–8.0)

## 2022-07-15 NOTE — Progress Notes (Unsigned)
Patient ID: Alexandra Reyes, female   DOB: [birth year], 89 y.o.   MRN: [id number]         Chief Complaint:: wellness exam and low vit d, ckd 3a, htn, hld, hyperglycemia       HPI:  Alexandra Reyes is a 89 y.o. female here for wellness exam; for tdap at pharmacy o/w up to date                        Also not taking Vit D.  Pt denies chest pain, increased sob or doe, wheezing, orthopnea, PND, increased LE swelling, palpitations, dizziness or syncope.   Pt denies polydipsia, polyuria, or new focal neuro s/s.    Pt denies fever, wt loss, night sweats, loss of appetite, or other constitutional symptoms    Wt Readings from Last 3 Encounters:  07/15/22 116 lb (52.6 kg)  05/07/22 114 lb (51.7 kg)  02/12/22 109 lb (49.4 kg)   BP Readings from Last 3 Encounters:  07/15/22 136/84  05/07/22 120/70  02/12/22 (!) 140/76   Immunization History  Administered Date(s) Administered   Fluad Quad(high Dose 65+) 05/07/2021   Influenza, High Dose Seasonal PF 05/08/2016, 03/10/2017, 04/06/2018   Influenza, Seasonal, Injecte, Preservative Fre 03/10/2013   Influenza,inj,quad, With Preservative 03/10/2017   Influenza-Unspecified 03/15/1972, 03/10/2014, 03/11/2015, 04/06/2018, 03/31/2019, 04/01/2022   PFIZER(Purple Top)SARS-COV-2 Vaccination 07/17/2019, 08/11/2019, 02/14/2020   PPD Test 02/06/2022   Pneumococcal Conjugate-13 05/10/2005, 06/30/2013   Pneumococcal Polysaccharide-23 06/10/1996   Tetanus 10/28/2012   Zoster Recombinat (Shingrix) 07/23/2018, 01/26/2019, 04/20/2019   Zoster, Live 05/10/2008   Health Maintenance Due  Topic Date Due   DTaP/Tdap/Td (1 - Tdap) 10/29/2012   Medicare Annual Wellness (AWV)  12/30/2020      Past Medical History:  Diagnosis Date   Age-related macular degeneration, wet, both eyes ([place])    Alcohol dependence in remission ([place]) 05/22/2011   Allergic rhinitis, cause unspecified 05/22/2011   Allergy    Anemia, iron deficiency 05/18/2011   Cholelithiasis    COPD  (chronic obstructive pulmonary disease) ([place])    DDD (degenerative disc disease), lumbar 05/18/2011   Depression    "@ times" (09/19/2016)   First degree AV block    GERD (gastroesophageal reflux disease)    Hiatal hernia    History of blood transfusion    "3 or 4 related to childbirth"   HTN (hypertension) 05/18/2011   pt denies this hx on 09/19/2016   Hyperlipidemia 05/18/2011   Macular degeneration    Myocardial infarction ([place])    "EKG shows I've had 2; date unknown" (09/19/2016)   OA (osteoarthritis)    "a little here and there; mainly in the back" (09/19/2016)   Osteoporosis 05/18/2011   PAT (paroxysmal atrial tachycardia)    Pectus excavatum 05/18/2011   Personal history of colonic polyps 10/28/2012   Pneumonia    "as a child"   SVT (supraventricular tachycardia)    Urinary frequency    Past Surgical History:  Procedure Laterality Date   [place]   "it was ok"   CARDIAC CATHETERIZATION     CARDIOVASCULAR STRESS TEST  03/08/2009   EF 84%   CATARACT EXTRACTION W/ INTRAOCULAR LENS  IMPLANT, BILATERAL Bilateral    INGUINAL HERNIA REPAIR Right 06/23/2017   Procedure: REPAIR INCARCERATED  RIGHT FEMORAL HERNIA WITH MESH;  Surgeon: Erroll Luna, MD;  Location: [facility];  Service: General;  Laterality: Right;   KNEE ARTHROSCOPY  Right    REVERSE SHOULDER ARTHROPLASTY Left 09/05/2020   Procedure: REVERSE SHOULDER ARTHROPLASTY;  Surgeon: Nicholes Stairs, MD;  Location: [facility];  Service: Orthopedics;  Laterality: Left;   RIGHT/LEFT HEART CATH AND CORONARY ANGIOGRAPHY N/A 07/25/2016   Procedure: Right/Left Heart Cath and Coronary Angiography;  Surgeon: Burnell Blanks, MD;  Location: [facility];  Service: Cardiovascular;  Laterality: N/A;   SVT ABLATION  09/19/2016   SVT ABLATION N/A 09/19/2016   Procedure: SVT Ablation;  Surgeon: Thompson Grayer, MD;  Location: [facility];  Service: Cardiovascular;  Laterality: N/A;    TONSILLECTOMY AND ADENOIDECTOMY     US ECHOCARDIOGRAPHY  01/17/2003   EF 55-60%    reports that she quit smoking about 28 years ago. Her smoking use included cigarettes. She started smoking about 63 years ago. She has a 54.00 pack-year smoking history. She has never used smokeless tobacco. She reports that she does not drink alcohol and does not use drugs. family history includes Arthritis in her mother; Heart disease in her father; Heart failure in her mother; Hypertension in her father; Kidney failure in her father. Allergies  Allergen Reactions   Citalopram    Citalopram Hydrobromide Other (See Comments)   Oxycodone Other (See Comments)    hallucinations   Current Outpatient Medications on File Prior to Visit  Medication Sig Dispense Refill   acetaminophen (TYLENOL) 500 MG tablet Take 1,000 mg by mouth 2 (two) times daily as needed for moderate pain or headache.     albuterol (VENTOLIN HFA) 108 (90 Base) MCG/ACT inhaler Inhale 2 puffs into the lungs every 6 (six) hours as needed for wheezing or shortness of breath. 8 g 6   ALPRAZolam (XANAX) 0.25 MG tablet Take 1 tablet (0.25 mg total) by mouth 2 (two) times daily as needed for anxiety. 60 tablet 5   apixaban (ELIQUIS) 2.5 MG TABS tablet Take 1 tablet (2.5 mg total) by mouth 2 (two) times daily. 60 tablet 6   Ascorbic Acid (VITAMIN C PO) Take 500 mg by mouth daily. Reported on 08/08/2015     B Complex Vitamins (VITAMIN B COMPLEX PO) Take 1 tablet by mouth daily.     BREO ELLIPTA 100-25 MCG/ACT AEPB INHALE 1 PUFF INTO THE LUNGS DAILY 60 each 2   buPROPion (WELLBUTRIN XL) 150 MG 24 hr tablet Take 1 tablet (150 mg total) by mouth daily. 90 tablet 3   calcium carbonate (OS-CAL) 600 MG tablet Take 600 mg by mouth daily.     cetirizine (ZYRTEC) 10 MG tablet Take 10 mg by mouth daily.     diltiazem (CARDIZEM CD) 120 MG 24 hr capsule Take 1 capsule (120 mg total) by mouth daily. 15 capsule 0   diltiazem (CARDIZEM) 30 MG tablet Take 1 tablet (30 mg  total) by mouth 4 (four) times daily as needed. 60 tablet 11   furosemide (LASIX) 20 MG tablet Take 1 tablet (20 mg total) by mouth daily as needed. 30 tablet 0   Glucosamine HCl-MSM (GLUCOSAMINE-MSM PO) Take 2 tablets by mouth daily.      nitroGLYCERIN (NITROSTAT) 0.4 MG SL tablet Place 1 tablet (0.4 mg total) under the tongue every 5 (five) minutes as needed for chest pain. 25 tablet 3   omeprazole (PRILOSEC) 40 MG capsule TAKE ONE CAPSULE BY MOUTH DAILY 90 capsule 1   Respiratory Therapy Supplies (FLUTTER) DEVI Use as directed 1 each 0   simvastatin (ZOCOR) 10 MG tablet Take 1 tablet (10 mg total)  by mouth daily. 90 tablet 3   traMADol (ULTRAM) 50 MG tablet Take 1 tablet (50 mg total) by mouth 2 (two) times daily as needed. 60 tablet 2   No current facility-administered medications on file prior to visit.        ROS:  All others reviewed and negative.  Objective        PE:  BP 136/84 (BP Location: Left Arm, Patient Position: Sitting, Cuff Size: Normal)   Pulse 72   Temp 97.6 F (36.4 C) (Oral)   Ht 5\' 2"  (1.575 m)   Wt 116 lb (52.6 kg)   LMP  (LMP Unknown)   SpO2 96%   BMI 21.22 kg/m                 Constitutional: Pt appears in NAD               HENT: Head: NCAT.                Right Ear: External ear normal.                 Left Ear: External ear normal.                Eyes: . Pupils are equal, round, and reactive to light. Conjunctivae and EOM are normal               Nose: without d/c or deformity               Neck: Neck supple. Gross normal ROM               Cardiovascular: Normal rate and regular rhythm.                 Pulmonary/Chest: Effort normal and breath sounds without rales or wheezing.                Abd:  Soft, NT, ND, + BS, no organomegaly               Neurological: Pt is alert. At baseline orientation, motor grossly intact               Skin: Skin is warm. No rashes, no other new lesions, LE edema - none               Psychiatric: Pt behavior is normal  without agitation   Micro: none  Cardiac tracings I have personally interpreted today:  none  Pertinent Radiological findings (summarize): none   Lab Results  Component Value Date   WBC 7.9 07/10/2022   HGB 12.8 07/10/2022   HCT 38.1 07/10/2022   PLT 334.0 07/10/2022   GLUCOSE 132 (H) 07/10/2022   CHOL 194 07/10/2022   TRIG 90.0 07/10/2022   HDL 82.50 07/10/2022   LDLDIRECT 129.2 05/15/2011   LDLCALC 94 07/10/2022   ALT 15 07/10/2022   AST 18 07/10/2022   NA 131 (L) 07/10/2022   K 4.2 07/10/2022   CL 100 07/10/2022   CREATININE 1.16 07/10/2022   BUN 22 07/10/2022   CO2 22 07/10/2022   TSH 1.52 07/10/2022   INR 1.0 07/17/2016   HGBA1C 5.7 07/10/2022   Assessment/Plan:  MARIAELENA CADE is a 89 y.o. White or Caucasian [1] female with  has a past medical history of Age-related macular degeneration, wet, both eyes ([place]), Alcohol dependence in remission ([place]) (05/22/2011), Allergic rhinitis, cause unspecified (05/22/2011), Allergy, Anemia, iron deficiency (05/18/2011), Cholelithiasis, COPD (chronic obstructive pulmonary disease) ([place]),  DDD (degenerative disc disease), lumbar (05/18/2011), Depression, First degree AV block, GERD (gastroesophageal reflux disease), Hiatal hernia, History of blood transfusion, HTN (hypertension) (05/18/2011), Hyperlipidemia (05/18/2011), Macular degeneration, Myocardial infarction ([facility]), OA (osteoarthritis), Osteoporosis (05/18/2011), PAT (paroxysmal atrial tachycardia), Pectus excavatum (05/18/2011), Personal history of colonic polyps (10/28/2012), Pneumonia, SVT (supraventricular tachycardia), and Urinary frequency.  Encounter for well adult exam with abnormal findings Age and sex appropriate education and counseling updated with regular exercise and diet Referrals for preventative services - none needed Immunizations addressed - for tdap at pharmacy Smoking counseling  - none needed Evidence for depression or other mood disorder - none significant Most  recent labs reviewed. I have personally reviewed and have noted: 1) the patient's medical and social history 2) The patient's current medications and supplements 3) The patient's height, weight, and BMI have been recorded in the chart   Vitamin D deficiency Last vitamin D Lab Results  Component Value Date   VD25OH 34.92 07/10/2022   Low, to start oral replacement   Hyperlipidemia Lab Results  Component Value Date   LDLCALC 94 07/10/2022   Stable, pt to continue current statin zocor 10 mg qd   Hyperglycemia Lab Results  Component Value Date   HGBA1C 5.7 07/10/2022   Stable, pt to continue current medical treatment  - diet, wt control   HTN (hypertension) BP Readings from Last 3 Encounters:  07/15/22 136/84  05/07/22 120/70  02/12/22 (!) 140/76   Stable, pt to continue medical treatment card CD 120 qd   CKD (chronic kidney disease), stage III ([facility]CC) Lab Results  Component Value Date   CREATININE 1.16 07/10/2022   Stable overall, cont to avoid nephrotoxins  Followup: Return in about 6 months (around 01/13/2023).  Oliver Barre, MD 07/16/2022 8:14 PM [facility] - [facility]

## 2022-07-15 NOTE — Patient Instructions (Addendum)
Please take OTC Vitamin D3 at 2000 units per day, indefinitely  Please continue all other medications as before, and refills have been done if requested.  Please have the pharmacy call with any other refills you may need.  Please continue your efforts at being more active, low cholesterol diet, and weight control.  You are otherwise up to date with prevention measures today.  Please keep your appointments with your specialists as you may have planned  Please make an Appointment to return in 6 months, or sooner if needed, also with Lab Appointment for testing done 3-5 days before at the [facility] (so this is for TWO appointments - please see the scheduling desk as you leave)

## 2022-07-16 ENCOUNTER — Encounter: Payer: Self-pay | Admitting: Internal Medicine

## 2022-07-16 DIAGNOSIS — Z0001 Encounter for general adult medical examination with abnormal findings: Secondary | ICD-10-CM | POA: Insufficient documentation

## 2022-07-16 NOTE — Assessment & Plan Note (Signed)
Last vitamin D Lab Results  Component Value Date   VD25OH 34.92 07/10/2022   Low, to start oral replacement

## 2022-07-16 NOTE — Assessment & Plan Note (Signed)
BP Readings from Last 3 Encounters:  07/15/22 136/84  05/07/22 120/70  02/12/22 (!) 140/76   Stable, pt to continue medical treatment card CD 120 qd

## 2022-07-16 NOTE — Assessment & Plan Note (Signed)
>>  ASSESSMENT AND PLAN FOR HTN (HYPERTENSION) WRITTEN ON 07/16/2022  8:13 PM BY Corwin Levins, MD  BP Readings from Last 3 Encounters:  07/15/22 136/84  05/07/22 120/70  02/12/22 (!) 140/76   Stable, pt to continue medical treatment card CD 120 qd

## 2022-07-16 NOTE — Assessment & Plan Note (Signed)
Lab Results  Component Value Date   CREATININE 1.16 07/10/2022   Stable overall, cont to avoid nephrotoxins

## 2022-07-16 NOTE — Assessment & Plan Note (Signed)
Age and sex appropriate education and counseling updated with regular exercise and diet Referrals for preventative services - none needed Immunizations addressed - for tdap at pharmacy Smoking counseling  - none needed Evidence for depression or other mood disorder - none significant Most recent labs reviewed. I have personally reviewed and have noted: 1) the patient's medical and social history 2) The patient's current medications and supplements 3) The patient's height, weight, and BMI have been recorded in the chart  

## 2022-07-16 NOTE — Assessment & Plan Note (Signed)
Lab Results  Component Value Date   HGBA1C 5.7 07/10/2022   Stable, pt to continue current medical treatment  - diet, wt control

## 2022-07-16 NOTE — Assessment & Plan Note (Signed)
Lab Results  Component Value Date   LDLCALC 94 07/10/2022   Stable, pt to continue current statin zocor 10 mg qd

## 2022-07-23 DIAGNOSIS — E782 Mixed hyperlipidemia: Secondary | ICD-10-CM | POA: Diagnosis not present

## 2022-07-23 DIAGNOSIS — K582 Mixed irritable bowel syndrome: Secondary | ICD-10-CM | POA: Diagnosis not present

## 2022-07-23 DIAGNOSIS — K219 Gastro-esophageal reflux disease without esophagitis: Secondary | ICD-10-CM | POA: Diagnosis not present

## 2022-07-23 DIAGNOSIS — M1991 Primary osteoarthritis, unspecified site: Secondary | ICD-10-CM | POA: Diagnosis not present

## 2022-07-23 DIAGNOSIS — J449 Chronic obstructive pulmonary disease, unspecified: Secondary | ICD-10-CM | POA: Diagnosis not present

## 2022-07-23 DIAGNOSIS — I129 Hypertensive chronic kidney disease with stage 1 through stage 4 chronic kidney disease, or unspecified chronic kidney disease: Secondary | ICD-10-CM | POA: Diagnosis not present

## 2022-07-23 DIAGNOSIS — N1831 Chronic kidney disease, stage 3a: Secondary | ICD-10-CM | POA: Diagnosis not present

## 2022-08-14 DIAGNOSIS — N1831 Chronic kidney disease, stage 3a: Secondary | ICD-10-CM | POA: Diagnosis not present

## 2022-08-14 DIAGNOSIS — K623 Rectal prolapse: Secondary | ICD-10-CM | POA: Diagnosis not present

## 2022-08-14 DIAGNOSIS — R350 Frequency of micturition: Secondary | ICD-10-CM | POA: Diagnosis not present

## 2022-08-14 DIAGNOSIS — I252 Old myocardial infarction: Secondary | ICD-10-CM | POA: Diagnosis not present

## 2022-08-14 DIAGNOSIS — K588 Other irritable bowel syndrome: Secondary | ICD-10-CM | POA: Diagnosis not present

## 2022-08-14 DIAGNOSIS — E782 Mixed hyperlipidemia: Secondary | ICD-10-CM | POA: Diagnosis not present

## 2022-08-14 DIAGNOSIS — J449 Chronic obstructive pulmonary disease, unspecified: Secondary | ICD-10-CM | POA: Diagnosis not present

## 2022-08-14 DIAGNOSIS — I10 Essential (primary) hypertension: Secondary | ICD-10-CM | POA: Diagnosis not present

## 2022-08-22 DIAGNOSIS — I10 Essential (primary) hypertension: Secondary | ICD-10-CM | POA: Diagnosis not present

## 2022-08-22 DIAGNOSIS — N1831 Chronic kidney disease, stage 3a: Secondary | ICD-10-CM | POA: Diagnosis not present

## 2022-08-22 DIAGNOSIS — J449 Chronic obstructive pulmonary disease, unspecified: Secondary | ICD-10-CM | POA: Diagnosis not present

## 2022-08-28 ENCOUNTER — Telehealth: Payer: Self-pay | Admitting: Pulmonary Disease

## 2022-08-28 NOTE — Telephone Encounter (Deleted)
Correction to prev note. Pt wants to know can she take her albuterol at night time

## 2022-08-28 NOTE — Telephone Encounter (Addendum)
Pt wants to know can she take her albuterol at night time

## 2022-08-28 NOTE — Telephone Encounter (Signed)
Spoke to pt answered her question. Pt verbalized understanding. Nothing further needed.

## 2022-08-31 ENCOUNTER — Telehealth: Payer: Self-pay | Admitting: Internal Medicine

## 2022-08-31 NOTE — Telephone Encounter (Signed)
89 yo woman on breo and prn albuterol. Hasn't been seen since 2022. Calling in over the weekend for shortness of breath x 2 days. No associated fevers, chills, cough, wheezing. Notes these symptoms started after receiving bad news about her son. Has been taking breo, albuterol and xanax. Wants to know what else she could take. Doesn't seem that steroids of abx are indicated at this time. She feels symptoms are related to "being 37, and there's not much you can do about that." Recommend in office evaluation this week with Dr. Valeta Harms or APP to determine if these symptoms are pulmonary in nature. Advised taking albuterol up to every 4 hours as needed for dyspnea if it helps. Otherwise she says she felt better when she talked to someone at her assisted living facility and it helped calm her down. Suspect her symptoms are largely attributed to anxiety.

## 2022-09-02 NOTE — Telephone Encounter (Signed)
Spoke with patient. She advises she was a little tachycardic last night. Staff in nursing home was able to get her calmed down. Patient has had increased sob x 2 weeks. I offered her several apts this week patient was not able to come in early due to living in a assisted living and needing a ride.  Nothing further needed

## 2022-09-06 ENCOUNTER — Telehealth: Payer: Self-pay | Admitting: Cardiology

## 2022-09-06 NOTE — Telephone Encounter (Signed)
Pt c/o medication issue:  1. Name of Medication:   ALPRAZolam (XANAX) 0.25 MG tablet    2. How are you currently taking this medication (dosage and times per day)?   Take 1 tablet (0.25 mg total) by mouth 2 (two) times daily as needed for anxiety.    3. Are you having a reaction (difficulty breathing--STAT)? No  4. What is your medication issue?  Pt's daughter would like to know if pt is able to have something extra for anxiety. She states pt's son is not doing well and pt is having a herd time with that. Daughter would like a callback regarding this matter as soon as possible. Please advise

## 2022-09-06 NOTE — Telephone Encounter (Signed)
Spoke with pt's daughter,DPR who states her brother is dying and not expected to live through the day.  She reports pt is very upset and trying to get to the hospital to see him before he passes.  She is asking if there is another medication she can take along with her Xanax to boost it's effect prescription or OTC.  She reports pt's PCP office is closed today.  Advised Dr Marlou Porch is out of the office and there is not a medication cardiology can prescribe or suggest OTC for pt's anxiety and stress she is currently experiencing.  Advised pt may consider going to urgent care for further assistance if needed.  RN offered empathy for current situation.  Pt's daughter verbalizes understanding and thanked Therapist, sports for the call.

## 2022-09-10 ENCOUNTER — Ambulatory Visit (INDEPENDENT_AMBULATORY_CARE_PROVIDER_SITE_OTHER): Payer: Medicare Other

## 2022-09-10 ENCOUNTER — Encounter: Payer: Self-pay | Admitting: Adult Health

## 2022-09-10 ENCOUNTER — Ambulatory Visit (INDEPENDENT_AMBULATORY_CARE_PROVIDER_SITE_OTHER): Payer: Medicare Other | Admitting: Adult Health

## 2022-09-10 VITALS — BP 130/70 | HR 91 | Temp 97.7°F | Ht 63.0 in | Wt 122.0 lb

## 2022-09-10 DIAGNOSIS — J479 Bronchiectasis, uncomplicated: Secondary | ICD-10-CM | POA: Diagnosis not present

## 2022-09-10 DIAGNOSIS — I5189 Other ill-defined heart diseases: Secondary | ICD-10-CM | POA: Diagnosis not present

## 2022-09-10 DIAGNOSIS — R0609 Other forms of dyspnea: Secondary | ICD-10-CM | POA: Diagnosis not present

## 2022-09-10 DIAGNOSIS — R042 Hemoptysis: Secondary | ICD-10-CM | POA: Diagnosis not present

## 2022-09-10 DIAGNOSIS — R0602 Shortness of breath: Secondary | ICD-10-CM | POA: Diagnosis not present

## 2022-09-10 MED ORDER — TRELEGY ELLIPTA 100-62.5-25 MCG/ACT IN AEPB: 1.0000 | INHALATION_SPRAY | Freq: Every day | RESPIRATORY_TRACT | 0 refills | Status: DC

## 2022-09-10 MED ORDER — FUROSEMIDE 20 MG PO TABS: 20.0000 mg | ORAL_TABLET | Freq: Every day | ORAL | 0 refills | Status: DC

## 2022-09-10 NOTE — Progress Notes (Addendum)
@Patient  ID: Alexandra Reyes, female    DOB: 1925-11-22, 89 y.o.   MRN: [id number]  Chief Complaint  Patient presents with   Acute Visit    Referring provider: Corwin Levins, MD  HPI: 89 year old female former smoker -heavy smoking followed for bronchiectasis Retired Designer, jewellery from [employer] Medical history significant A Fib , SVT s/p ablation  TEST/EVENTS :  PFT 08/2018 no significant airflow obstruction or restriction, mild decreased DLCO .   CT chest July 2020 showed bilateral lower lobe bronchiectasis and interstitial coarsening.  Findings may be postinfectious/postinflammatory.  Groundglass nodules similar measuring up to 4 mm.  09/10/2022 Follow up ; Bronchiectasis  Patient presents for a follow-up visit.  Last seen November 2022.  Patient is followed for bronchiectasis.  Patient complains over the last 2 weeks her breathing has not been doing as well.  Complains of increased shortness of breath with activities over last 2 months. Gets winded with walking . Noticed she was weak after having the Flu over the holidays in Nov/Dec 2023. Definitely worse last 3-4  weeks. Has minimal cough but did cough up mucus mixed with blood this am.  Moved to assisted living last year. Not happy with the way her meds are given out.  Son just passed away from cancer last week . Under a lot of stress.  Diagnosed with A Fib started on Eliquis over the holidays Nov 2023.  Fell off toilet , bruising to forehead . Cane . Has balance issues  Walk today in office with O2 sats 95% on room air. Remains on Breo daily. Rare use of Albuterol.  Can not use Vest therapy .  Chest x-ray today shows chronic interstitial markings.  No acute process.   Allergies  Allergen Reactions   Citalopram    Citalopram Hydrobromide Other (See Comments)   Oxycodone Other (See Comments)    hallucinations    Immunization History  Administered Date(s) Administered   Fluad Quad(high Dose 65+) 05/07/2021    Influenza, High Dose Seasonal PF 05/08/2016, 03/10/2017, 04/06/2018   Influenza, Seasonal, Injecte, Preservative Fre 03/10/2013   Influenza,inj,quad, With Preservative 03/10/2017   Influenza-Unspecified 03/15/1972, 03/10/2014, 03/11/2015, 04/06/2018, 03/31/2019, 04/01/2022   PFIZER(Purple Top)SARS-COV-2 Vaccination 07/17/2019, 08/11/2019, 02/14/2020   PPD Test 02/06/2022   Pneumococcal Conjugate-13 05/10/2005, 06/30/2013   Pneumococcal Polysaccharide-23 06/10/1996   Tetanus 10/28/2012   Zoster Recombinat (Shingrix) 07/23/2018, 01/26/2019, 04/20/2019   Zoster, Live 05/10/2008    Past Medical History:  Diagnosis Date   Age-related macular degeneration, wet, both eyes    Alcohol dependence in remission 05/22/2011   Allergic rhinitis, cause unspecified 05/22/2011   Allergy    Anemia, iron deficiency 05/18/2011   Cholelithiasis    COPD (chronic obstructive pulmonary disease)    DDD (degenerative disc disease), lumbar 05/18/2011   Depression    "@ times" (09/19/2016)   First degree AV block    GERD (gastroesophageal reflux disease)    Hiatal hernia    History of blood transfusion    "3 or 4 related to childbirth"   HTN (hypertension) 05/18/2011   pt denies this hx on 09/19/2016   Hyperlipidemia 05/18/2011   Macular degeneration    Myocardial infarction    "EKG shows I've had 2; date unknown" (09/19/2016)   OA (osteoarthritis)    "a little here and there; mainly in the back" (09/19/2016)   Osteoporosis 05/18/2011   PAT (paroxysmal atrial tachycardia)    Pectus excavatum 05/18/2011   Personal history of colonic polyps 10/28/2012  Pneumonia    "as a child"   SVT (supraventricular tachycardia)    Urinary frequency     Tobacco History: Social History   Tobacco Use  Smoking Status Former   Packs/day: 1.50   Years: 36.00   Additional pack years: 0.00   Total pack years: 54.00   Types: Cigarettes   Start date: 03/01/1959   Quit date: 06/10/1994   Years since quitting: 28.2   Smokeless Tobacco Never   Counseling given: Not Answered   Outpatient Medications Prior to Visit  Medication Sig Dispense Refill   acetaminophen (TYLENOL) 500 MG tablet Take 1,000 mg by mouth 2 (two) times daily as needed for moderate pain or headache.     albuterol (VENTOLIN HFA) 108 (90 Base) MCG/ACT inhaler Inhale 2 puffs into the lungs every 6 (six) hours as needed for wheezing or shortness of breath. 8 g 6   ALPRAZolam (XANAX) 0.25 MG tablet Take 1 tablet (0.25 mg total) by mouth 2 (two) times daily as needed for anxiety. 60 tablet 5   apixaban (ELIQUIS) 2.5 MG TABS tablet Take 1 tablet (2.5 mg total) by mouth 2 (two) times daily. 60 tablet 6   Ascorbic Acid (VITAMIN C PO) Take 500 mg by mouth daily. Reported on 08/08/2015     B Complex Vitamins (VITAMIN B COMPLEX PO) Take 1 tablet by mouth daily.     BREO ELLIPTA 100-25 MCG/ACT AEPB INHALE 1 PUFF INTO THE LUNGS DAILY 60 each 2   buPROPion (WELLBUTRIN XL) 150 MG 24 hr tablet Take 1 tablet (150 mg total) by mouth daily. 90 tablet 3   calcium carbonate (OS-CAL) 600 MG tablet Take 600 mg by mouth daily.     cetirizine (ZYRTEC) 10 MG tablet Take 10 mg by mouth daily.     diltiazem (CARDIZEM CD) 120 MG 24 hr capsule Take 1 capsule (120 mg total) by mouth daily. 15 capsule 0   diltiazem (CARDIZEM) 30 MG tablet Take 1 tablet (30 mg total) by mouth 4 (four) times daily as needed. 60 tablet 11   Glucosamine HCl-MSM (GLUCOSAMINE-MSM PO) Take 2 tablets by mouth daily.      nitroGLYCERIN (NITROSTAT) 0.4 MG SL tablet Place 1 tablet (0.4 mg total) under the tongue every 5 (five) minutes as needed for chest pain. 25 tablet 3   omeprazole (PRILOSEC) 40 MG capsule TAKE ONE CAPSULE BY MOUTH DAILY 90 capsule 1   simvastatin (ZOCOR) 10 MG tablet Take 1 tablet (10 mg total) by mouth daily. 90 tablet 3   traMADol (ULTRAM) 50 MG tablet Take 1 tablet (50 mg total) by mouth 2 (two) times daily as needed. 60 tablet 2   furosemide (LASIX) 20 MG tablet Take 1  tablet (20 mg total) by mouth daily as needed. (Patient not taking: Reported on 09/10/2022) 30 tablet 0   Respiratory Therapy Supplies (FLUTTER) DEVI Use as directed (Patient not taking: Reported on 09/10/2022) 1 each 0   No facility-administered medications prior to visit.     Review of Systems:   Constitutional:   No  weight loss, night sweats,  Fevers, chills,  +fatigue, or  lassitude.  HEENT:   No headaches,  Difficulty swallowing,  Tooth/dental problems, or  Sore throat,                No sneezing, itching, ear ache, nasal congestion, post nasal drip,   CV:  No chest pain,  Orthopnea, PND, swelling in lower extremities, anasarca, dizziness, palpitations, syncope.   GI  No  heartburn, indigestion, abdominal pain, nausea, vomiting, diarrhea, change in bowel habits, loss of appetite, bloody stools.   Resp: .  No chest wall deformity  Skin: no rash or lesions.  GU: no dysuria, change in color of urine, no urgency or frequency.  No flank pain, no hematuria   MS:  No joint pain or swelling.  No decreased range of motion.  No back pain.    Physical Exam  BP 130/70 (BP Location: Left Arm, Patient Position: Sitting, Cuff Size: Normal)   Pulse 91   Temp 97.7 F (36.5 C) (Oral)   Ht 5\' 3"  (1.6 m)   Wt 122 lb (55.3 kg)   LMP  (LMP Unknown)   SpO2 94%   BMI 21.61 kg/m   GEN: A/Ox3; pleasant , NAD, elderly   HEENT:  [place]/AT,  NOSE-clear, THROAT-clear, no lesions, no postnasal drip or exudate noted.   NECK:  Supple w/ fair ROM; no JVD; normal carotid impulses w/o bruits; no thyromegaly or nodules palpated; no lymphadenopathy.    RESP  Clear  P & A; w/o, wheezes/ rales/ or rhonchi. no accessory muscle use, no dullness to percussion  CARD:  RRR, no m/r/g, 1+ peripheral edema, pulses intact, no cyanosis or clubbing.  GI:   Soft & nt; nml bowel sounds; no organomegaly or masses detected.   Musco: Warm bil, no deformities or joint swelling noted.   Neuro: alert, no focal deficits  noted.    Skin: Warm, no lesions or rashes mild bruising along right eyebrow.     Lab Results:  CBC   BMET  Imaging: DG Chest 2 View  Result Date: 09/10/2022 CLINICAL DATA:  Bronchiectasis hemoptysis short of breath EXAM: CHEST - 2 VIEW COMPARISON:  Chest CT 09/03/2020 FINDINGS: Left shoulder replacement. Mild cardiomegaly. Aortic atherosclerosis. No acute airspace disease, pleural effusion or pneumothorax. Slightly coarse interstitial opacities appear chronic. The patient's chin obscures the apices. Mild atelectasis or scarring at the bases. IMPRESSION: No active cardiopulmonary disease. Mild cardiomegaly. Mild atelectasis or scarring at the bases with coarse chronic appearing opacities. Electronically Signed   By: Donavan Foil M.D.   On: 09/10/2022 15:31         Latest Ref Rng & Units 08/26/2018   10:51 AM 07/12/2015    3:52 PM 04/28/2013   11:58 AM  PFT Results  FVC-Pre L 2.27  P 2.29  2.34   FVC-Predicted Pre % 112  P 106  103   FVC-Post L 2.29  P 2.39  2.33   FVC-Predicted Post % 113  P 110  103   Pre FEV1/FVC % % 75  P 68  69   Post FEV1/FCV % % 76  P 74  70   FEV1-Pre L 1.70  P 1.57  1.63   FEV1-Predicted Pre % 114  P 99  97   FEV1-Post L 1.74  P 1.76  1.63   DLCO uncorrected ml/min/mmHg 12.50  P 12.09  12.65   DLCO UNC% % 71  P 52  55   DLVA Predicted % 89  P 70  67   TLC L 4.86  P  4.51   TLC % Predicted % 99  P  92   RV % Predicted % 107  P  76     P Preliminary result    No results found for: "NITRICOXIDE"      Assessment & Plan:   Bronchiectasis without acute exacerbation (HCC) Questionable mild flare-chest x-ray without acute process.  Previous PFTs  with no significant airflow obstruction or restriction.  Will increase maintenance regimen to Trelegy inhaler to see if this gives her any symptom relief.    Plan  Patient Instructions  Labs today.  Stop Breo, Begin Trelegy 1 puff daily (call back if you want to change to Trelegy )  Take Lasix 20mg   daily for next 2 days .  Elevate legs as able  Albuterol inhaler As needed   Follow up with Cardiology next week as planned  Follow up with Dr .Valeta Harms in 4 weeks and As needed   Please contact office for sooner follow up if symptoms do not improve or worsen or seek emergency care      Diastolic dysfunction Possible component of mild volume overload.  Will give gentle diuresis with Lasix 20 mg daily for the next 2 days. Continue follow-up with cardiology next week.  Dyspnea on exertion Increased shortness of breath with activity suspect is multifactorial.  Chest x-ray today without acute process.  Will check lab work with CBC Bement and BNP if BNP is elevated consider 2D echo.  Patient does have follow-up with cardiology next week.  Will give gentle diuresis.  Expand bronchodilator maintenance regimen to Trelegy.  Close follow-up Plan  Patient Instructions  Labs today.  Stop Breo, Begin Trelegy 1 puff daily (call back if you want to change to Trelegy )  Take Lasix 20mg  daily for next 2 days .  Elevate legs as able  Albuterol inhaler As needed   Follow up with Cardiology next week as planned  Follow up with Dr .Valeta Harms in 4 weeks and As needed   Please contact office for sooner follow up if symptoms do not improve or worsen or seek emergency care       I spent   45 minutes dedicated to the care of this patient on the date of this encounter to include pre-visit review of records, face-to-face time with the patient discussing conditions above, post visit ordering of testing, clinical documentation with the electronic health record, making appropriate referrals as documented, and communicating necessary findings to members of the patients care team.   Rexene Edison, NP 09/10/2022

## 2022-09-10 NOTE — Assessment & Plan Note (Signed)
Possible component of mild volume overload.  Will give gentle diuresis with Lasix 20 mg daily for the next 2 days. Continue follow-up with cardiology next week.

## 2022-09-10 NOTE — Assessment & Plan Note (Signed)
Increased shortness of breath with activity suspect is multifactorial.  Chest x-ray today without acute process.  Will check lab work with CBC Bement and BNP if BNP is elevated consider 2D echo.  Patient does have follow-up with cardiology next week.  Will give gentle diuresis.  Expand bronchodilator maintenance regimen to Trelegy.  Close follow-up Plan  Patient Instructions  Labs today.  Stop Breo, Begin Trelegy 1 puff daily (call back if you want to change to Trelegy )  Take Lasix 20mg  daily for next 2 days .  Elevate legs as able  Albuterol inhaler As needed   Follow up with Cardiology next week as planned  Follow up with Dr .Valeta Harms in 4 weeks and As needed   Please contact office for sooner follow up if symptoms do not improve or worsen or seek emergency care

## 2022-09-10 NOTE — Patient Instructions (Addendum)
Labs today.  Stop Breo, Begin Trelegy 1 puff daily (call back if you want to change to Trelegy )  Take Lasix 20mg  daily for next 2 days .  Elevate legs as able  Albuterol inhaler As needed   Follow up with Cardiology next week as planned  Follow up with Dr .Valeta Harms in 4 weeks and As needed   Please contact office for sooner follow up if symptoms do not improve or worsen or seek emergency care

## 2022-09-10 NOTE — Assessment & Plan Note (Signed)
Questionable mild flare-chest x-ray without acute process.  Previous PFTs with no significant airflow obstruction or restriction.  Will increase maintenance regimen to Trelegy inhaler to see if this gives her any symptom relief.    Plan  Patient Instructions  Labs today.  Stop Breo, Begin Trelegy 1 puff daily (call back if you want to change to Trelegy )  Take Lasix 20mg  daily for next 2 days .  Elevate legs as able  Albuterol inhaler As needed   Follow up with Cardiology next week as planned  Follow up with Dr .Valeta Harms in 4 weeks and As needed   Please contact office for sooner follow up if symptoms do not improve or worsen or seek emergency care

## 2022-09-11 ENCOUNTER — Telehealth: Payer: Self-pay | Admitting: *Deleted

## 2022-09-11 LAB — CBC WITH DIFFERENTIAL/PLATELET
Basophils Absolute: 0.1 10*3/uL (ref 0.0–0.1)
Basophils Relative: 0.8 % (ref 0.0–3.0)
Eosinophils Absolute: 0.1 10*3/uL (ref 0.0–0.7)
Eosinophils Relative: 1.6 % (ref 0.0–5.0)
HCT: 36 % (ref 36.0–46.0)
Hemoglobin: 12.1 g/dL (ref 12.0–15.0)
Lymphocytes Relative: 11.9 % — ABNORMAL LOW (ref 12.0–46.0)
Lymphs Abs: 1 10*3/uL (ref 0.7–4.0)
MCHC: 33.5 g/dL (ref 30.0–36.0)
MCV: 91.4 fl (ref 78.0–100.0)
Monocytes Absolute: 0.9 10*3/uL (ref 0.1–1.0)
Monocytes Relative: 10.9 % (ref 3.0–12.0)
Neutro Abs: 6.2 10*3/uL (ref 1.4–7.7)
Neutrophils Relative %: 74.8 % (ref 43.0–77.0)
Platelets: 372 10*3/uL (ref 150.0–400.0)
RBC: 3.94 Mil/uL (ref 3.87–5.11)
RDW: 14 % (ref 11.5–15.5)
WBC: 8.3 10*3/uL (ref 4.0–10.5)

## 2022-09-11 LAB — BASIC METABOLIC PANEL
BUN: 19 mg/dL (ref 6–23)
CO2: 22 mEq/L (ref 19–32)
Calcium: 8.5 mg/dL (ref 8.4–10.5)
Chloride: 97 mEq/L (ref 96–112)
Creatinine, Ser: 0.98 mg/dL (ref 0.40–1.20)
GFR: 49.4 mL/min — ABNORMAL LOW (ref >60.00)
Glucose, Bld: 90 mg/dL (ref 70–99)
Potassium: 4.5 mEq/L (ref 3.5–5.1)
Sodium: 127 mEq/L — ABNORMAL LOW (ref 135–145)

## 2022-09-11 LAB — BRAIN NATRIURETIC PEPTIDE: Pro B Natriuretic peptide (BNP): 1299 pg/mL — ABNORMAL HIGH (ref 0.0–100.0)

## 2022-09-11 NOTE — Telephone Encounter (Signed)
ATC patient x1.  LVM to return call regarding a better number to call at [facility] to speak with the nursing staff regarding the Lasix that she is to take today and tomorrow.  Will await return call.  Called [facility] at [phone], I was transferred to a number, no one was available to speak with, no VM available.

## 2022-09-12 ENCOUNTER — Telehealth: Payer: Self-pay | Admitting: Adult Health

## 2022-09-12 NOTE — Progress Notes (Signed)
Attempted to call patient at home/mobile number.  LM on VM.  Called alternate contact listed in patients chart.  Spoke with patients daughter, Alexandra Reyes. (On DPR).  Daughter is on her way to see patient now.  Reviewed all labs with Patti.  Rexene Edison, NP also spoke with patients daughter and gave detailed information regarding labs and plan of care.  Patients daughter verbalized understanding.  She will take her mother to the Emergency room immediately if patient is not doing well.  Rexene Edison, NP offered patient an office visit for tomorrow, (09/13/22) at 1:30 pm  in our [facility] clinic.  Daughter agreed to bring her mother to this appointment if she does not go to the emergency room today.  Scheduled office visit 09/13/22 at 1:30 pm.  Patients daughter, Alexandra Reyes, will call clinic if any further questions or concerns.

## 2022-09-12 NOTE — Telephone Encounter (Signed)
Spoke with daughter will see tomorrow as planned  ER if worse

## 2022-09-12 NOTE — Telephone Encounter (Signed)
Pt's daughter Precious Bard called the office wanting to speak with Tammy about pt.   She states that pt is feeling better compared to OV. Pt did take the lasix and also ate a full lunch. Precious Bard states that they are not planning on going to the ED, unless Tammy suggests for pt to still go. Pt does have a 1:30 OV scheduled tomorrow 4/5.  Routing to [employer].

## 2022-09-13 ENCOUNTER — Ambulatory Visit (INDEPENDENT_AMBULATORY_CARE_PROVIDER_SITE_OTHER): Payer: Medicare Other | Admitting: Adult Health

## 2022-09-13 ENCOUNTER — Encounter: Payer: Self-pay | Admitting: Adult Health

## 2022-09-13 VITALS — BP 136/70 | HR 87 | Temp 97.4°F | Ht 63.0 in | Wt 117.4 lb

## 2022-09-13 DIAGNOSIS — I5020 Unspecified systolic (congestive) heart failure: Secondary | ICD-10-CM | POA: Insufficient documentation

## 2022-09-13 DIAGNOSIS — J449 Chronic obstructive pulmonary disease, unspecified: Secondary | ICD-10-CM | POA: Diagnosis not present

## 2022-09-13 DIAGNOSIS — I480 Paroxysmal atrial fibrillation: Secondary | ICD-10-CM | POA: Diagnosis not present

## 2022-09-13 DIAGNOSIS — R0609 Other forms of dyspnea: Secondary | ICD-10-CM

## 2022-09-13 DIAGNOSIS — E871 Hypo-osmolality and hyponatremia: Secondary | ICD-10-CM | POA: Diagnosis not present

## 2022-09-13 DIAGNOSIS — I5033 Acute on chronic diastolic (congestive) heart failure: Secondary | ICD-10-CM

## 2022-09-13 DIAGNOSIS — J479 Bronchiectasis, uncomplicated: Secondary | ICD-10-CM | POA: Diagnosis not present

## 2022-09-13 DIAGNOSIS — I5030 Unspecified diastolic (congestive) heart failure: Secondary | ICD-10-CM | POA: Insufficient documentation

## 2022-09-13 DIAGNOSIS — I4891 Unspecified atrial fibrillation: Secondary | ICD-10-CM | POA: Insufficient documentation

## 2022-09-13 LAB — BRAIN NATRIURETIC PEPTIDE: Pro B Natriuretic peptide (BNP): 1388 pg/mL — ABNORMAL HIGH (ref 0.0–100.0)

## 2022-09-13 LAB — BASIC METABOLIC PANEL WITH GFR
BUN: 16 mg/dL (ref 6–23)
CO2: 22 meq/L (ref 19–32)
Calcium: 8.8 mg/dL (ref 8.4–10.5)
Chloride: 98 meq/L (ref 96–112)
Creatinine, Ser: 1.06 mg/dL (ref 0.40–1.20)
GFR: 44.96 mL/min — ABNORMAL LOW
Glucose, Bld: 96 mg/dL (ref 70–99)
Potassium: 4.1 meq/L (ref 3.5–5.1)
Sodium: 128 meq/L — ABNORMAL LOW (ref 135–145)

## 2022-09-13 MED ORDER — TRELEGY ELLIPTA 100-62.5-25 MCG/ACT IN AEPB: 1.0000 | INHALATION_SPRAY | Freq: Every day | RESPIRATORY_TRACT | 5 refills | Status: DC

## 2022-09-13 MED ORDER — FUROSEMIDE 20 MG PO TABS: 20.0000 mg | ORAL_TABLET | Freq: Every day | ORAL | 0 refills | Status: DC | PRN

## 2022-09-13 NOTE — Assessment & Plan Note (Signed)
Suspected acute decompensation of diastolic heart failure.  BNP is elevated at 1299.  Patient has no associated chest pain.  Chest x-ray shows no overt pulmonary edema.  I recommend an EKG and echo.  Patient does have a cardiology follow-up in 4 days.  She wants to wait to have these done at that time.  Seems to be slightly improved with initiation of Lasix.  Will continue with gentle diuresis with Lasix.  Check labs today.  If kidney function allows.  Will increase Lasix to 40 mg for 1 day then resume 20 mg.   Plan  Patient Instructions  Labs today.  Continue on Trelegy 1 puff daily.  Take Lasix 20mg  daily until seen by Cardiology next week.  Elevate legs as able  Albuterol inhaler As needed   Follow up with Cardiology next week as planned  Follow up with Dr .Tonia Brooms in 6-8 weeks and As needed   Please contact office for sooner follow up if symptoms do not improve or worsen or seek emergency care

## 2022-09-13 NOTE — Progress Notes (Signed)
Daughter, Clayborne Dana called after hours service.  I attempted to return her call.  No answer.  Left message that I was returning her call.

## 2022-09-13 NOTE — Progress Notes (Signed)
ATC patient and daughter Alexandra Reyes Dana), no answer, left detailed message per DPR.

## 2022-09-13 NOTE — Assessment & Plan Note (Signed)
Continue on current regimen and mucociliary clearance.

## 2022-09-13 NOTE — Assessment & Plan Note (Signed)
History of A-fib-rate appears to be controlled.  Continue with follow-up with cardiology.  Continue on maintenance regimen

## 2022-09-13 NOTE — Patient Instructions (Addendum)
Labs today.  Continue on Trelegy 1 puff daily.  Take Lasix 20mg  daily until seen by Cardiology next week.  Elevate legs as able  Albuterol inhaler As needed   Follow up with Cardiology next week as planned  Follow up with Dr .Tonia Brooms in 6-8 weeks and As needed   Please contact office for sooner follow up if symptoms do not improve or worsen or seek emergency care

## 2022-09-13 NOTE — Assessment & Plan Note (Signed)
Improved symptom burden on Trelegy.  Continue current regimen

## 2022-09-13 NOTE — Progress Notes (Signed)
 @Patient  ID: Alexandra Reyes, female    DOB: 11-20-1925, 89 y.o.   MRN: [id number]  Chief Complaint  Patient presents with   Acute Visit    Referring provider: [name], [name], MD  HPI: 89 year old female former smoker followed for bronchiectasis Retired Designer, jewelleryregistered nurse, [facility]. [phone] main . Nursing line [phone] Medical history significant for A-fib, SVT status post ablation  TEST/EVENTS :  PFT 08/2018 no significant airflow obstruction or restriction, mild decreased DLCO .    CT chest July 2020 showed bilateral lower lobe bronchiectasis and interstitial coarsening.  Findings may be postinfectious/postinflammatory.  Groundglass nodules similar measuring up to 4 mm.  09/13/2022 Follow up ; Dyspnea  Patient presents for a 3-day follow-up.  Patient was seen earlier this week with progressive shortness of breath and decreased activity tolerance.  Patient says she had the flu over the holidays in November and December.  Since then she her breathing has not been doing as good.  She has been more short of breath with activities.  Definitely over the last few weeks has noticed that her breathing has been getting worse.  She has minimally productive cough.  Did have 1 episode of some blood-tinged mucus but no further episodes.  Chest x-ray last visit showed chronic interstitial changes.  Walk test earlier this week showed O2 saturation is 95% on room air.  [facility]  She was started on Lasix 20 mg daily . Lab work showed sodium level is trending down from 131-127.  BNP was very high at 1299.  We have discussed with her daughter for patient to be evaluated in the emergency room however patient says she was slightly improved and wanted to avoid hospitalization if possible. Patient says since last visit she does feel slightly better and less short of breath.  We discussed getting an EKG and 2D  echo.  Patient wants to hold off she is seeing cardiology on 09/17/22.  She does have a history of A-fib and is on Eliquis.  Endorses compliance.    Allergies  Allergen Reactions   Citalopram    Citalopram Hydrobromide Other (See Comments)   Oxycodone Other (See Comments)    hallucinations    Immunization History  Administered Date(s) Administered   Fluad Quad(high Dose 65+) 05/07/2021   Influenza, High Dose Seasonal PF 05/08/2016, 03/10/2017, 04/06/2018   Influenza, Seasonal, Injecte, Preservative Fre 03/10/2013   Influenza,inj,quad, With Preservative 03/10/2017   Influenza-Unspecified 03/15/1972, 03/10/2014, 03/11/2015, 04/06/2018, 03/31/2019, 04/01/2022   PFIZER(Purple Top)SARS-COV-2 Vaccination 07/17/2019, 08/11/2019, 02/14/2020   PPD Test 02/06/2022   Pneumococcal Conjugate-13 05/10/2005, 06/30/2013   Pneumococcal Polysaccharide-23 06/10/1996   Tetanus 10/28/2012   Zoster Recombinat (Shingrix) 07/23/2018, 01/26/2019, 04/20/2019   Zoster, Live 05/10/2008    Past Medical History:  Diagnosis Date   Age-related macular degeneration, wet, both eyes    Alcohol dependence in remission 05/22/2011   Allergic rhinitis, cause unspecified 05/22/2011   Allergy    Anemia, iron deficiency 05/18/2011   Cholelithiasis    COPD (chronic obstructive pulmonary disease)    DDD (degenerative disc disease), lumbar 05/18/2011   Depression    "@ times" (09/19/2016)   First degree AV block    GERD (gastroesophageal reflux disease)    Hiatal hernia    History of blood transfusion    "3 or 4 related to childbirth"   HTN (hypertension) 05/18/2011   pt denies this hx on 09/19/2016   Hyperlipidemia 05/18/2011  Macular degeneration    Myocardial infarction    "EKG shows I've had 2; date unknown" (09/19/2016)   OA (osteoarthritis)    "a little here and there; mainly in the back" (09/19/2016)   Osteoporosis 05/18/2011   PAT (paroxysmal atrial tachycardia)    Pectus excavatum 05/18/2011   Personal  history of colonic polyps 10/28/2012   Pneumonia    "as a child"   SVT (supraventricular tachycardia)    Urinary frequency     Tobacco History: Social History   Tobacco Use  Smoking Status Former   Packs/day: 1.50   Years: 36.00   Additional pack years: 0.00   Total pack years: 54.00   Types: Cigarettes   Start date: 03/01/1959   Quit date: 06/10/1994   Years since quitting: 28.2  Smokeless Tobacco Never   Counseling given: Not Answered   Outpatient Medications Prior to Visit  Medication Sig Dispense Refill   acetaminophen (TYLENOL) 500 MG tablet Take 1,000 mg by mouth 2 (two) times daily as needed for moderate pain or headache.     albuterol (VENTOLIN HFA) 108 (90 Base) MCG/ACT inhaler Inhale 2 puffs into the lungs every 6 (six) hours as needed for wheezing or shortness of breath. 8 g 6   ALPRAZolam (XANAX) 0.25 MG tablet Take 1 tablet (0.25 mg total) by mouth 2 (two) times daily as needed for anxiety. 60 tablet 5   apixaban (ELIQUIS) 2.5 MG TABS tablet Take 1 tablet (2.5 mg total) by mouth 2 (two) times daily. 60 tablet 6   Ascorbic Acid (VITAMIN C PO) Take 500 mg by mouth daily. Reported on 08/08/2015     B Complex Vitamins (VITAMIN B COMPLEX PO) Take 1 tablet by mouth daily.     buPROPion (WELLBUTRIN XL) 150 MG 24 hr tablet Take 1 tablet (150 mg total) by mouth daily. 90 tablet 3   calcium carbonate (OS-CAL) 600 MG tablet Take 600 mg by mouth daily.     cetirizine (ZYRTEC) 10 MG tablet Take 10 mg by mouth daily.     diltiazem (CARDIZEM CD) 120 MG 24 hr capsule Take 1 capsule (120 mg total) by mouth daily. 15 capsule 0   diltiazem (CARDIZEM) 30 MG tablet Take 1 tablet (30 mg total) by mouth 4 (four) times daily as needed. 60 tablet 11   Glucosamine HCl-MSM (GLUCOSAMINE-MSM PO) Take 2 tablets by mouth daily.      nitroGLYCERIN (NITROSTAT) 0.4 MG SL tablet Place 1 tablet (0.4 mg total) under the tongue every 5 (five) minutes as needed for chest pain. 25 tablet 3   omeprazole  (PRILOSEC) 40 MG capsule TAKE ONE CAPSULE BY MOUTH DAILY 90 capsule 1   Respiratory Therapy Supplies (FLUTTER) DEVI Use as directed 1 each 0   simvastatin (ZOCOR) 10 MG tablet Take 1 tablet (10 mg total) by mouth daily. 90 tablet 3   traMADol (ULTRAM) 50 MG tablet Take 1 tablet (50 mg total) by mouth 2 (two) times daily as needed. 60 tablet 2   BREO ELLIPTA 100-25 MCG/ACT AEPB INHALE 1 PUFF INTO THE LUNGS DAILY 60 each 2   Fluticasone-Umeclidin-Vilant (TRELEGY ELLIPTA) 100-62.5-25 MCG/ACT AEPB Inhale 1 puff into the lungs daily. 3 each 0   furosemide (LASIX) 20 MG tablet Take 1 tablet (20 mg total) by mouth daily as needed. 30 tablet 0   furosemide (LASIX) 20 MG tablet Take 1 tablet (20 mg total) by mouth daily. (Patient not taking: Reported on 09/13/2022) 2 tablet 0   No facility-administered medications prior  to visit.     Review of Systems:   Constitutional:   No  weight loss, night sweats,  Fevers, chills, + fatigue, or  lassitude.  HEENT:   No headaches,  Difficulty swallowing,  Tooth/dental problems, or  Sore throat,                No sneezing, itching, ear ache, nasal congestion, post nasal drip,   CV:  No chest pain,  Orthopnea, PND, swelling in lower extremities, anasarca, dizziness, palpitations, syncope.   GI  No heartburn, indigestion, abdominal pain, nausea, vomiting, diarrhea, change in bowel habits, loss of appetite, bloody stools.   Resp: .  No chest wall deformity  Skin: no rash or lesions.  GU: no dysuria, change in color of urine, no urgency or frequency.  No flank pain, no hematuria   MS:  No joint pain or swelling.  No decreased range of motion.  No back pain.    Physical Exam  BP 136/70 (BP Location: Left Arm, Patient Position: Sitting, Cuff Size: Normal)   Pulse 87   Temp (!) 97.4 F (36.3 C) (Oral)   Ht 5\' 3"  (1.6 m)   Wt 117 lb 6.4 oz (53.3 kg)   LMP  (LMP Unknown)   SpO2 93%   BMI 20.80 kg/m   GEN: A/Ox3; pleasant , NAD, elderly    HEENT:   [place]/AT,   NOSE-clear, THROAT-clear, no lesions, no postnasal drip or exudate noted.   NECK:  Supple w/ fair ROM; no JVD; normal carotid impulses w/o bruits; no thyromegaly or nodules palpated; no lymphadenopathy.    RESP  Clear  P & A; w/o, wheezes/ rales/ or rhonchi. no accessory muscle use, no dullness to percussion  CARD:  irreg/irreg HR 85 , no m/r/g, tr peripheral edema, pulses intact, no cyanosis or clubbing.  GI:   Soft & nt; nml bowel sounds; no organomegaly or masses detected.   Musco: Warm bil, no deformities or joint swelling noted.   Neuro: alert, no focal deficits noted.    Skin: Warm, no lesions or rashes    Lab Results:  CBC    Component Value Date/Time   WBC 8.3 09/10/2022 1521   RBC 3.94 09/10/2022 1521   HGB 12.1 09/10/2022 1521   HGB 13.9 07/17/2016 1029   HCT 36.0 09/10/2022 1521   HCT 41.7 07/17/2016 1029   PLT 372.0 09/10/2022 1521   PLT 387 (H) 07/17/2016 1029   MCV 91.4 09/10/2022 1521   MCV 89 07/17/2016 1029   MCH 29.8 09/07/2020 0729   MCHC 33.5 09/10/2022 1521   RDW 14.0 09/10/2022 1521   RDW 14.7 07/17/2016 1029   LYMPHSABS 1.0 09/10/2022 1521   LYMPHSABS 1.3 07/17/2016 1029   MONOABS 0.9 09/10/2022 1521   EOSABS 0.1 09/10/2022 1521   EOSABS 0.2 07/17/2016 1029   BASOSABS 0.1 09/10/2022 1521   BASOSABS 0.1 07/17/2016 1029    BMET    Component Value Date/Time   NA 127 (L) 09/10/2022 1521   NA 136 07/17/2016 1029   K 4.5 09/10/2022 1521   CL 97 09/10/2022 1521   CO2 22 09/10/2022 1521   GLUCOSE 90 09/10/2022 1521   BUN 19 09/10/2022 1521   BUN 14 07/17/2016 1029   CREATININE 0.98 09/10/2022 1521   CREATININE 1.11 (H) 02/14/2016 1234   CALCIUM 8.5 09/10/2022 1521   GFRNONAA >60 09/07/2020 0729   GFRAA 58 (L) 06/25/2017 1034    BNP No results found for: "BNP"  ProBNP  Component Value Date/Time   PROBNP 1,299.0 (H) 09/10/2022 1521    Imaging: DG Chest 2 View  Result Date: 09/10/2022 CLINICAL DATA:  Bronchiectasis  hemoptysis short of breath EXAM: CHEST - 2 VIEW COMPARISON:  Chest CT 09/03/2020 FINDINGS: Left shoulder replacement. Mild cardiomegaly. Aortic atherosclerosis. No acute airspace disease, pleural effusion or pneumothorax. Slightly coarse interstitial opacities appear chronic. The patient's chin obscures the apices. Mild atelectasis or scarring at the bases. IMPRESSION: No active cardiopulmonary disease. Mild cardiomegaly. Mild atelectasis or scarring at the bases with coarse chronic appearing opacities. Electronically Signed   By: Jasmine Pang M.D.   On: 09/10/2022 15:31         Latest Ref Rng & Units 08/26/2018   10:51 AM 07/12/2015    3:52 PM 04/28/2013   11:58 AM  PFT Results  FVC-Pre L 2.27  P 2.29  2.34   FVC-Predicted Pre % 112  P 106  103   FVC-Post L 2.29  P 2.39  2.33   FVC-Predicted Post % 113  P 110  103   Pre FEV1/FVC % % 75  P 68  69   Post FEV1/FCV % % 76  P 74  70   FEV1-Pre L 1.70  P 1.57  1.63   FEV1-Predicted Pre % 114  P 99  97   FEV1-Post L 1.74  P 1.76  1.63   DLCO uncorrected ml/min/mmHg 12.50  P 12.09  12.65   DLCO UNC% % 71  P 52  55   DLVA Predicted % 89  P 70  67   TLC L 4.86  P  4.51   TLC % Predicted % 99  P  92   RV % Predicted % 107  P  76     P Preliminary result    No results found for: "NITRICOXIDE"      Assessment & Plan:   Diastolic CHF Suspected acute decompensation of diastolic heart failure.  BNP is elevated at 1299.  Patient has no associated chest pain.  Chest x-ray shows no overt pulmonary edema.  I recommend an EKG and echo.  Patient does have a cardiology follow-up in 4 days.  She wants to wait to have these done at that time.  Seems to be slightly improved with initiation of Lasix.  Will continue with gentle diuresis with Lasix.  Check labs today.  If kidney function allows.  Will increase Lasix to 40 mg for 1 day then resume 20 mg.   Plan  Patient Instructions  Labs today.  Continue on Trelegy 1 puff daily.  Take Lasix 20mg  daily  until seen by Cardiology next week.  Elevate legs as able  Albuterol inhaler As needed   Follow up with Cardiology next week as planned  Follow up with Dr .Tonia Brooms in 6-8 weeks and As needed   Please contact office for sooner follow up if symptoms do not improve or worsen or seek emergency care     COPD (chronic obstructive pulmonary disease) Improved symptom burden on Trelegy.  Continue current regimen  Bronchiectasis (HCC) Continue on current regimen and mucociliary clearance.  A-fib History of A-fib-rate appears to be controlled.  Continue with follow-up with cardiology.  Continue on maintenance regimen   I spent  41  minutes dedicated to the care of this patient on the date of this encounter to include pre-visit review of records, face-to-face time with the patient discussing conditions above, post visit ordering of testing, clinical documentation with the electronic health  record, making appropriate referrals as documented, and communicating necessary findings to members of the patients care team.     Alexandra Oaksammy Atwood Adcock, NP 09/13/2022

## 2022-09-17 ENCOUNTER — Encounter: Payer: Self-pay | Admitting: Cardiology

## 2022-09-17 ENCOUNTER — Ambulatory Visit: Payer: Medicare Other | Attending: Cardiology | Admitting: Cardiology

## 2022-09-17 VITALS — BP 120/60 | HR 89 | Ht 63.0 in | Wt 117.0 lb

## 2022-09-17 DIAGNOSIS — I10 Essential (primary) hypertension: Secondary | ICD-10-CM | POA: Diagnosis not present

## 2022-09-17 DIAGNOSIS — I5189 Other ill-defined heart diseases: Secondary | ICD-10-CM | POA: Insufficient documentation

## 2022-09-17 DIAGNOSIS — R0602 Shortness of breath: Secondary | ICD-10-CM | POA: Insufficient documentation

## 2022-09-17 MED ORDER — FUROSEMIDE 40 MG PO TABS: 40.0000 mg | ORAL_TABLET | Freq: Every day | ORAL | 1 refills | Status: DC

## 2022-09-17 MED ORDER — FUROSEMIDE 40 MG PO TABS: 40.0000 mg | ORAL_TABLET | Freq: Every day | ORAL | 11 refills | Status: DC

## 2022-09-17 NOTE — Addendum Note (Signed)
Addended by: Sharin Grave on: 09/17/2022 03:11 PM   Modules accepted: Orders

## 2022-09-17 NOTE — Progress Notes (Signed)
Cardiology Office Note:    Date:  09/17/2022   ID:  Alexandra Reyes, Alexandra Reyes 1925-09-25, MRN [id number]  PCP:  Corwin Levins, MD  [facility] Cardiologist:  Donato Schultz, MD  [facility] Electrophysiologist:  Hillis Range, MD (Inactive)   Referring MD: Corwin Levins, MD   History of Present Illness:    Alexandra Reyes is a 89 y.o. female here for the follow-up CAD hypertension hyperlipidemia SVT post ablation 09/2016.  Recent pulmonology visit 09/13/2022 with dyspnea.  Serum sodium between 127 and 131 BNP 1299.  More short of breath with activities been getting worse over the last several weeks.  See below for details.   Came to [place] from [place] to help treat the polio epidemic. Went to [employer] school in [place].  Assisted living community; [facility].   Past Medical History:  Diagnosis Date   Age-related macular degeneration, wet, both eyes    Alcohol dependence in remission 05/22/2011   Allergic rhinitis, cause unspecified 05/22/2011   Allergy    Anemia, iron deficiency 05/18/2011   Cholelithiasis    COPD (chronic obstructive pulmonary disease)    DDD (degenerative disc disease), lumbar 05/18/2011   Depression    "@ times" (09/19/2016)   First degree AV block    GERD (gastroesophageal reflux disease)    Hiatal hernia    History of blood transfusion    "3 or 4 related to childbirth"   HTN (hypertension) 05/18/2011   pt denies this hx on 09/19/2016   Hyperlipidemia 05/18/2011   Macular degeneration    Myocardial infarction    "EKG shows I've had 2; date unknown" (09/19/2016)   OA (osteoarthritis)    "a little here and there; mainly in the back" (09/19/2016)   Osteoporosis 05/18/2011   PAT (paroxysmal atrial tachycardia)    Pectus excavatum 05/18/2011   Personal history of colonic polyps 10/28/2012   Pneumonia    "as a child"   SVT (supraventricular tachycardia)    Urinary frequency     Past Surgical History:  Procedure Laterality Date    ABDOMINAL HYSTERECTOMY  1988   BREAST BIOPSY Right 1948   "it was ok"   CARDIAC CATHETERIZATION     CARDIOVASCULAR STRESS TEST  03/08/2009   EF 84%   CATARACT EXTRACTION W/ INTRAOCULAR LENS  IMPLANT, BILATERAL Bilateral    INGUINAL HERNIA REPAIR Right 06/23/2017   Procedure: REPAIR INCARCERATED  RIGHT FEMORAL HERNIA WITH MESH;  Surgeon: Harriette Bouillon, MD;  Location: [facility] OR;  Service: General;  Laterality: Right;   KNEE ARTHROSCOPY Right    REVERSE SHOULDER ARTHROPLASTY Left 09/05/2020   Procedure: REVERSE SHOULDER ARTHROPLASTY;  Surgeon: Yolonda Kida, MD;  Location: [facility] OR;  Service: Orthopedics;  Laterality: Left;   RIGHT/LEFT HEART CATH AND CORONARY ANGIOGRAPHY N/A 07/25/2016   Procedure: Right/Left Heart Cath and Coronary Angiography;  Surgeon: Kathleene Hazel, MD;  Location: [facility] INVASIVE CV LAB;  Service: Cardiovascular;  Laterality: N/A;   SVT ABLATION  09/19/2016   SVT ABLATION N/A 09/19/2016   Procedure: SVT Ablation;  Surgeon: Hillis Range, MD;  Location: [facility] INVASIVE CV LAB;  Service: Cardiovascular;  Laterality: N/A;   TONSILLECTOMY AND ADENOIDECTOMY     US ECHOCARDIOGRAPHY  01/17/2003   EF 55-60%    Current Medications: Current Meds  Medication Sig   acetaminophen (TYLENOL) 500 MG tablet Take 1,000 mg by mouth 2 (two) times daily as needed for moderate pain or headache.   albuterol (VENTOLIN HFA) 108 (90  Base) MCG/ACT inhaler Inhale 2 puffs into the lungs every 6 (six) hours as needed for wheezing or shortness of breath.   ALPRAZolam (XANAX) 0.25 MG tablet Take 1 tablet (0.25 mg total) by mouth 2 (two) times daily as needed for anxiety.   apixaban (ELIQUIS) 2.5 MG TABS tablet Take 1 tablet (2.5 mg total) by mouth 2 (two) times daily.   Ascorbic Acid (VITAMIN C PO) Take 500 mg by mouth daily. Reported on 08/08/2015   B Complex Vitamins (VITAMIN B COMPLEX PO) Take 1 tablet by mouth daily.   buPROPion (WELLBUTRIN XL) 150 MG 24 hr tablet Take 1 tablet (150 mg total) by  mouth daily.   calcium carbonate (OS-CAL) 600 MG tablet Take 600 mg by mouth daily.   cetirizine (ZYRTEC) 10 MG tablet Take 10 mg by mouth daily.   diltiazem (CARDIZEM CD) 120 MG 24 hr capsule Take 1 capsule (120 mg total) by mouth daily.   diltiazem (CARDIZEM) 30 MG tablet Take 1 tablet (30 mg total) by mouth 4 (four) times daily as needed.   Fluticasone-Umeclidin-Vilant (TRELEGY ELLIPTA) 100-62.5-25 MCG/ACT AEPB Inhale 1 puff into the lungs daily.   furosemide (LASIX) 40 MG tablet Take 1 tablet (40 mg total) by mouth daily.   Glucosamine HCl-MSM (GLUCOSAMINE-MSM PO) Take 2 tablets by mouth daily.    nitroGLYCERIN (NITROSTAT) 0.4 MG SL tablet Place 1 tablet (0.4 mg total) under the tongue every 5 (five) minutes as needed for chest pain.   omeprazole (PRILOSEC) 40 MG capsule TAKE ONE CAPSULE BY MOUTH DAILY   Respiratory Therapy Supplies (FLUTTER) DEVI Use as directed   simvastatin (ZOCOR) 10 MG tablet Take 1 tablet (10 mg total) by mouth daily.   traMADol (ULTRAM) 50 MG tablet Take 1 tablet (50 mg total) by mouth 2 (two) times daily as needed.   [DISCONTINUED] furosemide (LASIX) 20 MG tablet Take 1 tablet (20 mg total) by mouth daily as needed.     Allergies:   Citalopram, Citalopram hydrobromide, and Oxycodone   Social History   Socioeconomic History   Marital status: Widowed    Spouse name: Not on file   Number of children: 2   Years of education: 67   Highest education level: Not on file  Occupational History   Occupation: Retired Designer, jewellery  Tobacco Use   Smoking status: Former    Packs/day: 1.50    Years: 36.00    Additional pack years: 0.00    Total pack years: 54.00    Types: Cigarettes    Start date: 03/01/1959    Quit date: 06/10/1994    Years since quitting: 28.2   Smokeless tobacco: Never  Vaping Use   Vaping Use: Never used  Substance and Sexual Activity   Alcohol use: No   Drug use: No   Sexual activity: Never  Other Topics Concern   Not on file  Social  History Narrative   Originally from [place], [place]. She moved to [place] in 1950 during the polio epidemic. 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 Remote bird exposure. No mold or hot tub exposure.    Social Determinants of Health   Financial Resource Strain: Low Risk  (12/31/2019)   Overall Financial Resource Strain (CARDIA)    Difficulty of Paying Living Expenses: Not hard at all  Food Insecurity: No Food Insecurity (12/31/2019)   Hunger Vital Sign    Worried About Running Out of Food in the Last Year: Never true    Ran Out of Food  in the Last Year: Never true  Transportation Needs: No Transportation Needs (12/31/2019)   PRAPARE - Administrator, Civil Service (Medical): No    Lack of Transportation (Non-Medical): No  Physical Activity: Inactive (12/29/2017)   Exercise Vital Sign    Days of Exercise per Week: 0 days    Minutes of Exercise per Session: 0 min  Stress: No Stress Concern Present (12/31/2019)   [employer] of Occupational Health - Occupational Stress Questionnaire    Feeling of Stress : Not at all  Social Connections: Moderately Integrated (12/29/2017)   Social Connection and Isolation Panel [NHANES]    Frequency of Communication with Friends and Family: More than three times a week    Frequency of Social Gatherings with Friends and Family: More than three times a week    Attends Religious Services: More than 4 times per year    Active Member of [employer] or Organizations: Yes    Attends Banker Meetings: More than 4 times per year    Marital Status: Widowed     Family History: The patient's family history includes Arthritis in her mother; Heart disease in her father; Heart failure in her mother; Hypertension in her father; Kidney failure in her father.  ROS:   Please see the history of present illness.     All other systems reviewed and are negative.  EKGs/Labs/Other Studies Reviewed:    The following studies were reviewed  today:  EKG: Sinus rhythm left bundle branch block heart rate 82 bpm  CT Abdomen/Pelvis 03/27/2021: IMPRESSION: 1. No acute findings are noted in the abdomen or pelvis to account for the patient's symptoms. 2. Small simple appearing cyst in the left adnexal region, likely an ovarian cyst. No follow-up imaging recommended. Note: This recommendation does not apply to premenarchal patients and to those with increased risk (genetic, family history, elevated tumor markers or other high-risk factors) of ovarian cancer. Reference: JACR 2020 Feb; 17(2):248-254. 3. Worsening areas of fibrosis in the lung bases concerning for progressive interstitial lung disease. Further characterization with nonemergent high-resolution chest CT is suggested in the near future. Additionally, outpatient referral to Pulmonology for further clinical evaluation is recommended. 4. There are calcifications of the aortic valve. Echocardiographic correlation for evaluation of potential valvular dysfunction may be warranted if clinically indicated. 5. Aortic atherosclerosis, in addition to at least right coronary artery disease.  Echo 01/03/2021: Sonographer Comments: Technically difficult to position patient.  IMPRESSIONS    1. Left ventricular ejection fraction, by estimation, is 55 to 60%. The  left ventricle has normal function. The left ventricle has no regional  wall motion abnormalities. Left ventricular diastolic parameters are  consistent with Grade I diastolic  dysfunction (impaired relaxation).   2. Right ventricular systolic function is normal. The right ventricular  size is normal. There is normal pulmonary artery systolic pressure.   3. The mitral valve is grossly normal. No evidence of mitral valve  regurgitation. No evidence of mitral stenosis.   4. The aortic valve is tricuspid. There is mild calcification of the  aortic valve. There is mild thickening of the aortic valve. Aortic valve   regurgitation is not visualized. Aortic valve sclerosis is present, with  no evidence of aortic valve stenosis.   5. Aortic dilatation noted. There is borderline dilatation of the  ascending aorta, measuring 39 mm.   Comparison(s): A prior study was performed on 07/01/2015. Prior images  unable to be directly viewed, comparison made by report only. Similar to  prior report.   CT Chest 09/03/2020: COMPARISON:  12/28/2018   FINDINGS: Cardiovascular: Thoracic aorta and its branches demonstrate diffuse atherosclerotic calcifications. No aneurysmal dilatation is identified. Coronary calcifications are seen. Pulmonary artery is not significantly enlarged.   Mediastinum/Nodes: Thoracic inlet is within normal limits with the exception of a 2 cm hypodense nodule within the right lobe of the thyroid bridging into the isthmus. No sizable hilar or mediastinal adenopathy is noted. No esophageal abnormality is seen.   Lungs/Pleura: Lungs are well aerated bilaterally. No focal infiltrate or sizable effusion is seen. Minimal right basilar atelectasis is noted. No sizable effusion or pneumothorax is seen.   Upper Abdomen: Renal cystic change is noted and stable from the prior exam. Remainder of the upper abdomen appears within normal limits.   Musculoskeletal: Compression deformity is noted at T2 which is new from the prior exam of 2020 but of uncertain chronicity. No other compression deformities are seen. Sternum is unremarkable. Comminuted proximal left humeral fracture is again seen stable from prior plain films. No acute rib fracture is noted.   IMPRESSION: Stable left humeral fracture.   No definitive rib abnormality is seen.   Chronic appearing T2 compression fracture. This is new from prior exam of 2020 but of uncertain chronicity. Correlation to point tenderness is recommended.   2 cm hypodense nodule within the right lobe of the thyroid. This is stable from a prior exam of  2020 and given the patient's age no follow-up recommended unless clinically warranted (ref: J Am Coll Radiol. 2015 Feb;12(2): 143-50).   Aortic Atherosclerosis (ICD10-I70.0).  Monitor 02/2017: Sinus rhythm Symptomatic episodes correspond to SVT.  This is a mid RP SVT and may represent ectopic atrial tachycardia, though reentrant arrhythmia cannot be excluded.  SVT Ablation 09/19/2016: CONCLUSIONS:  1. Sinus rhythm upon presentation.  2. The patient had dual AV nodal physiology with easily inducible and incessant classic AV nodal reentrant tachycardia, there were no other accessory pathways or arrhythmias induced  3. Successful radiofrequency modification of the slow AV nodal pathway  4. No inducible arrhythmias following ablation though the patient did have multifocal premature atrial contractiosn 5. No early apparent complications.   Cath 2018: Prox RCA lesion, 100 %stenosed. Prox LAD lesion, 20 %stenosed. LV end diastolic pressure is normal.   1. 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 The mid and distal vessel fills briskly from left to right collaterals.  2. Mild non-obstructive disease in the LAD 3. Normal filling pressures.  4. Normal PA pressures  Diagnostic Dominance: Right  Intervention    EKG: EKG is personally reviewed and interpreted. 05/07/2022: Atrial fibrillation 05/07/2021: EKG was not ordered. 12/13/2020 (Dr. Johney Frame): Sinus rhythm, incomplete LBBB.  Recent Labs: 07/10/2022: ALT 15; TSH 1.52 09/10/2022: Hemoglobin 12.1; Platelets 372.0 09/13/2022: BUN 16; Creatinine, Ser 1.06; Potassium 4.1; Pro B Natriuretic peptide (BNP) 1,388.0; Sodium 128   Recent Lipid Panel    Component Value Date/Time   CHOL 194 07/10/2022 1356   CHOL 160 09/30/2016 1205   TRIG 90.0 07/10/2022 1356   HDL 82.50 07/10/2022 1356   HDL 73 09/30/2016 1205   CHOLHDL 2 07/10/2022 1356   VLDL 18.0 07/10/2022 1356   LDLCALC 94 07/10/2022 1356   LDLCALC 69 09/30/2016 1205   LDLDIRECT  129.2 05/15/2011 0925     Risk Assessment/Calculations:       Physical Exam:    VS:  BP 120/60 (BP Location: Left Arm, Patient Position: Sitting, Cuff Size: Normal)   Pulse 89   Ht 5'  3" (1.6 m)   Wt 117 lb (53.1 kg)   LMP  (LMP Unknown)   SpO2 93%   BMI 20.73 kg/m     Wt Readings from Last 3 Encounters:  09/17/22 117 lb (53.1 kg)  09/13/22 117 lb 6.4 oz (53.3 kg)  09/10/22 122 lb (55.3 kg)     GEN:  Well nourished, well developed in no acute distress HEENT: Normal NECK: No JVD; No carotid bruits LYMPHATICS: No lymphadenopathy CARDIAC:  Irregularly irregular, no murmurs, rubs, gallops RESPIRATORY:  Clear to auscultation without rales, wheezing or rhonchi  ABDOMEN: Soft, non-tender, non-distended MUSCULOSKELETAL:  2+ ankle edema; No deformity  SKIN: Warm and dry NEUROLOGIC:  Alert and oriented x 3 PSYCHIATRIC:  Normal affect   ASSESSMENT:    1. Essential hypertension   2. Diastolic dysfunction   3. SOB (shortness of breath)       PLAN:    In order of problems listed above:  Acute on Chronic diastolic heart failure - Chest x-ray no significant pulmonary edema.  We will repeat echocardiogram.  Continue with Lasix.  BNP 1299.  Serum sodium is low.  Continue to restrict dietary sodium.  Continue with Lasix 40 mg once a day to reduce free water.  Traditionally would like to add SGLT2 inhibitor however will hold off. -She does feel better after 2 days of Lasix 40 mg.  She still has ankle edema still has more fluid to go.  Last creatinine 1.06. -Check EKG.  Check echocardiogram.  We will have her come back in 2 weeks for close follow-up.  Grieving - Her son recently died from cancer.  Her [place] from [place] with [place] today.  Paroxysmal atrial fibrillation - Previously discovered at office visit.  Heart rate is 86 bpm has a left bundle branch block as well.  Continue with Cardizem. - Eliquis 2.5 mg twice a day dose adjusted for her age and  weight. -Continue to monitor her closely   CAD with CTO of the RCA on cath in 2018 medical therapy history of atypical chest pain felt secondary to costochondritis, no changes in her symptoms. Continue current treatment   History of AVNRT ablation by Dr. Lourdes SledgeAllred-stable, on diltiazem.    Hypertension-BP stable    Hyperlipidemia-LDL 94.   Shared Decision Making/Informed Consent      Follow-up: 2 weeks with APP  Medication Adjustments/Labs and Tests Ordered: Current medicines are reviewed at length with the patient today.  Concerns regarding medicines are outlined above.   Orders Placed This Encounter  Procedures   EKG 12-Lead   ECHOCARDIOGRAM COMPLETE    Meds ordered this encounter  Medications   furosemide (LASIX) 40 MG tablet    Sig: Take 1 tablet (40 mg total) by mouth daily.    Dispense:  90 tablet    Refill:  1    Patient Instructions  Medication Instructions:  Please increase your Furosemide to 40 mg a day. Continue all other medications as listed.  *If you need a refill on your cardiac medications before your next appointment, please call your pharmacy*  Testing/Procedures: Your physician has requested that you have an echocardiogram. Echocardiography is a painless test that uses sound waves to create images of your heart. It provides your doctor with information about the size and shape of your heart and how well your heart's chambers and valves are working. This procedure takes approximately one hour. There are no restrictions for this procedure. Please do NOT wear cologne, perfume, aftershave, or lotions (  deodorant is allowed). Please arrive 15 minutes prior to your appointment time.   Follow-Up: At [facility], you and your health needs are our priority.  As part of our continuing mission to provide you with exceptional heart care, we have created designated Provider Care Teams.  These Care Teams include your primary Cardiologist (physician) and  Advanced Practice Providers (APPs -  Physician Assistants and Nurse Practitioners) who all work together to provide you with the care you need, when you need it.  We recommend signing up for the patient portal called "MyChart".  Sign up information is provided on this After Visit Summary.  MyChart is used to connect with patients for Virtual Visits (Telemedicine).  Patients are able to view lab/test results, encounter notes, upcoming appointments, etc.  Non-urgent messages can be sent to your provider as well.   To learn more about what you can do with MyChart, go to [email or web address].    Your next appointment:   2 week(s)  Provider:   Jari Favre, PA-C, Ronie Spies, PA-C, Robin Searing, NP, Nada Boozer, NP, Jacolyn Reedy, PA-C, Eligha Bridegroom, NP, or Tereso Newcomer, PA-C            Signed, Donato Schultz, MD  09/17/2022 3:03 PM    [facility]

## 2022-09-17 NOTE — Patient Instructions (Signed)
Medication Instructions:  Please increase your Furosemide to 40 mg a day. Continue all other medications as listed.  *If you need a refill on your cardiac medications before your next appointment, please call your pharmacy*  Testing/Procedures: Your physician has requested that you have an echocardiogram. Echocardiography is a painless test that uses sound waves to create images of your heart. It provides your doctor with information about the size and shape of your heart and how well your heart's chambers and valves are working. This procedure takes approximately one hour. There are no restrictions for this procedure. Please do NOT wear cologne, perfume, aftershave, or lotions (deodorant is allowed). Please arrive 15 minutes prior to your appointment time.   Follow-Up: At [facility], you and your health needs are our priority.  As part of our continuing mission to provide you with exceptional heart care, we have created designated Provider Care Teams.  These Care Teams include your primary Cardiologist (physician) and Advanced Practice Providers (APPs -  Physician Assistants and Nurse Practitioners) who all work together to provide you with the care you need, when you need it.  We recommend signing up for the patient portal called "MyChart".  Sign up information is provided on this After Visit Summary.  MyChart is used to connect with patients for Virtual Visits (Telemedicine).  Patients are able to view lab/test results, encounter notes, upcoming appointments, etc.  Non-urgent messages can be sent to your provider as well.   To learn more about what you can do with MyChart, go to [email or web address].    Your next appointment:   2 week(s)  Provider:   Jari Favre, PA-C, Ronie Spies, PA-C, Robin Searing, NP, Nada Boozer, NP, Jacolyn Reedy, PA-C, Eligha Bridegroom, NP, or Tereso Newcomer, PA-C

## 2022-10-02 ENCOUNTER — Encounter (HOSPITAL_COMMUNITY): Payer: Self-pay | Admitting: Cardiology

## 2022-10-02 ENCOUNTER — Encounter (HOSPITAL_COMMUNITY): Payer: Self-pay

## 2022-10-02 ENCOUNTER — Encounter: Payer: Self-pay | Admitting: Physician Assistant

## 2022-10-02 ENCOUNTER — Inpatient Hospital Stay (HOSPITAL_COMMUNITY)
Admission: AD | Admit: 2022-10-02 | Discharge: 2022-10-05 | DRG: 291 | Disposition: A | Discharge status: Skilled Nursing Facility | Payer: Medicare Other | Attending: Cardiovascular Disease | Admitting: Cardiovascular Disease

## 2022-10-02 ENCOUNTER — Other Ambulatory Visit: Payer: Self-pay

## 2022-10-02 ENCOUNTER — Ambulatory Visit (INDEPENDENT_AMBULATORY_CARE_PROVIDER_SITE_OTHER): Payer: Medicare Other | Admitting: Physician Assistant

## 2022-10-02 VITALS — BP 140/88 | HR 84 | Ht 63.0 in | Wt 124.8 lb

## 2022-10-02 DIAGNOSIS — I3139 Other pericardial effusion (noninflammatory): Secondary | ICD-10-CM | POA: Diagnosis present

## 2022-10-02 DIAGNOSIS — Z9841 Cataract extraction status, right eye: Secondary | ICD-10-CM

## 2022-10-02 DIAGNOSIS — I2510 Atherosclerotic heart disease of native coronary artery without angina pectoris: Secondary | ICD-10-CM

## 2022-10-02 DIAGNOSIS — I5032 Chronic diastolic (congestive) heart failure: Secondary | ICD-10-CM

## 2022-10-02 DIAGNOSIS — I252 Old myocardial infarction: Secondary | ICD-10-CM | POA: Diagnosis not present

## 2022-10-02 DIAGNOSIS — I110 Hypertensive heart disease with heart failure: Secondary | ICD-10-CM | POA: Diagnosis present

## 2022-10-02 DIAGNOSIS — I081 Rheumatic disorders of both mitral and tricuspid valves: Secondary | ICD-10-CM | POA: Diagnosis present

## 2022-10-02 DIAGNOSIS — Z79899 Other long term (current) drug therapy: Secondary | ICD-10-CM | POA: Diagnosis not present

## 2022-10-02 DIAGNOSIS — Z8249 Family history of ischemic heart disease and other diseases of the circulatory system: Secondary | ICD-10-CM

## 2022-10-02 DIAGNOSIS — Z66 Do not resuscitate: Secondary | ICD-10-CM | POA: Diagnosis present

## 2022-10-02 DIAGNOSIS — I447 Left bundle-branch block, unspecified: Secondary | ICD-10-CM | POA: Diagnosis present

## 2022-10-02 DIAGNOSIS — Z8261 Family history of arthritis: Secondary | ICD-10-CM

## 2022-10-02 DIAGNOSIS — J449 Chronic obstructive pulmonary disease, unspecified: Secondary | ICD-10-CM | POA: Diagnosis present

## 2022-10-02 DIAGNOSIS — H3530 Unspecified macular degeneration: Secondary | ICD-10-CM | POA: Diagnosis present

## 2022-10-02 DIAGNOSIS — Z86010 Personal history of colonic polyps: Secondary | ICD-10-CM | POA: Diagnosis not present

## 2022-10-02 DIAGNOSIS — E871 Hypo-osmolality and hyponatremia: Secondary | ICD-10-CM | POA: Diagnosis present

## 2022-10-02 DIAGNOSIS — I10 Essential (primary) hypertension: Secondary | ICD-10-CM | POA: Insufficient documentation

## 2022-10-02 DIAGNOSIS — I480 Paroxysmal atrial fibrillation: Secondary | ICD-10-CM

## 2022-10-02 DIAGNOSIS — J811 Chronic pulmonary edema: Secondary | ICD-10-CM | POA: Diagnosis not present

## 2022-10-02 DIAGNOSIS — M810 Age-related osteoporosis without current pathological fracture: Secondary | ICD-10-CM | POA: Diagnosis present

## 2022-10-02 DIAGNOSIS — Z90710 Acquired absence of both cervix and uterus: Secondary | ICD-10-CM

## 2022-10-02 DIAGNOSIS — Z885 Allergy status to narcotic agent status: Secondary | ICD-10-CM

## 2022-10-02 DIAGNOSIS — Z7901 Long term (current) use of anticoagulants: Secondary | ICD-10-CM

## 2022-10-02 DIAGNOSIS — I5033 Acute on chronic diastolic (congestive) heart failure: Secondary | ICD-10-CM | POA: Diagnosis not present

## 2022-10-02 DIAGNOSIS — F32A Depression, unspecified: Secondary | ICD-10-CM | POA: Diagnosis present

## 2022-10-02 DIAGNOSIS — K219 Gastro-esophageal reflux disease without esophagitis: Secondary | ICD-10-CM | POA: Diagnosis present

## 2022-10-02 DIAGNOSIS — I5043 Acute on chronic combined systolic (congestive) and diastolic (congestive) heart failure: Secondary | ICD-10-CM | POA: Diagnosis present

## 2022-10-02 DIAGNOSIS — Z888 Allergy status to other drugs, medicaments and biological substances status: Secondary | ICD-10-CM

## 2022-10-02 DIAGNOSIS — I509 Heart failure, unspecified: Secondary | ICD-10-CM | POA: Diagnosis not present

## 2022-10-02 DIAGNOSIS — E785 Hyperlipidemia, unspecified: Secondary | ICD-10-CM | POA: Diagnosis present

## 2022-10-02 DIAGNOSIS — Z96612 Presence of left artificial shoulder joint: Secondary | ICD-10-CM | POA: Diagnosis present

## 2022-10-02 DIAGNOSIS — Z87891 Personal history of nicotine dependence: Secondary | ICD-10-CM

## 2022-10-02 DIAGNOSIS — J90 Pleural effusion, not elsewhere classified: Secondary | ICD-10-CM | POA: Diagnosis not present

## 2022-10-02 DIAGNOSIS — F1021 Alcohol dependence, in remission: Secondary | ICD-10-CM | POA: Diagnosis present

## 2022-10-02 DIAGNOSIS — Z9842 Cataract extraction status, left eye: Secondary | ICD-10-CM

## 2022-10-02 DIAGNOSIS — Z961 Presence of intraocular lens: Secondary | ICD-10-CM | POA: Diagnosis present

## 2022-10-02 LAB — CBC WITH DIFFERENTIAL/PLATELET
Abs Immature Granulocytes: 0.03 10*3/uL (ref 0.00–0.07)
Basophils Absolute: 0 10*3/uL (ref 0.0–0.1)
Basophils Relative: 1 %
Eosinophils Absolute: 0.1 10*3/uL (ref 0.0–0.5)
Eosinophils Relative: 1 %
HCT: 31.8 % — ABNORMAL LOW (ref 36.0–46.0)
Hemoglobin: 11.3 g/dL — ABNORMAL LOW (ref 12.0–15.0)
Immature Granulocytes: 0 %
Lymphocytes Relative: 11 %
Lymphs Abs: 0.7 10*3/uL (ref 0.7–4.0)
MCH: 31 pg (ref 26.0–34.0)
MCHC: 35.5 g/dL (ref 30.0–36.0)
MCV: 87.4 fL (ref 80.0–100.0)
Monocytes Absolute: 0.6 10*3/uL (ref 0.1–1.0)
Monocytes Relative: 9 %
Neutro Abs: 5.5 10*3/uL (ref 1.7–7.7)
Neutrophils Relative %: 78 %
Platelets: 335 10*3/uL (ref 150–400)
RBC: 3.64 MIL/uL — ABNORMAL LOW (ref 3.87–5.11)
RDW: 13.9 % (ref 11.5–15.5)
WBC: 7 10*3/uL (ref 4.0–10.5)
nRBC: 0 % (ref 0.0–0.2)

## 2022-10-02 LAB — COMPREHENSIVE METABOLIC PANEL
ALT: 25 U/L (ref 0–44)
AST: 30 U/L (ref 15–41)
Albumin: 3.4 g/dL — ABNORMAL LOW (ref 3.5–5.0)
Alkaline Phosphatase: 64 U/L (ref 38–126)
Anion gap: 12 (ref 5–15)
BUN: 18 mg/dL (ref 8–23)
CO2: 21 mmol/L — ABNORMAL LOW (ref 22–32)
Calcium: 8.5 mg/dL — ABNORMAL LOW (ref 8.9–10.3)
Chloride: 94 mmol/L — ABNORMAL LOW (ref 98–111)
Creatinine, Ser: 1.01 mg/dL — ABNORMAL HIGH (ref 0.44–1.00)
GFR, Estimated: 52 mL/min — ABNORMAL LOW (ref >60)
Glucose, Bld: 101 mg/dL — ABNORMAL HIGH (ref 70–99)
Potassium: 3.7 mmol/L (ref 3.5–5.1)
Sodium: 127 mmol/L — ABNORMAL LOW (ref 135–145)
Total Bilirubin: 1.1 mg/dL (ref 0.3–1.2)
Total Protein: 5.2 g/dL — ABNORMAL LOW (ref 6.5–8.1)

## 2022-10-02 LAB — BRAIN NATRIURETIC PEPTIDE: B Natriuretic Peptide: 2095.8 pg/mL — ABNORMAL HIGH (ref 0.0–100.0)

## 2022-10-02 LAB — MAGNESIUM: Magnesium: 2.1 mg/dL (ref 1.7–2.4)

## 2022-10-02 MED ORDER — FLUTICASONE FUROATE-VILANTEROL 100-25 MCG/ACT IN AEPB
1.0000 | INHALATION_SPRAY | Freq: Every day | RESPIRATORY_TRACT | Status: DC
  Filled 2022-10-02: qty 28

## 2022-10-02 MED ORDER — PANTOPRAZOLE SODIUM 40 MG PO TBEC
40.0000 mg | DELAYED_RELEASE_TABLET | Freq: Every day | ORAL | Status: DC
  Administered 2022-10-03 – 2022-10-05 (×3): 40 mg via ORAL
  Filled 2022-10-02 (×3): qty 1

## 2022-10-02 MED ORDER — CALCIUM 600 MG PO TABS: 600.0000 mg | ORAL_TABLET | Freq: Every day | ORAL | Status: DC

## 2022-10-02 MED ORDER — SODIUM CHLORIDE 0.9 % IV SOLN: 250.0000 mL | INTRAVENOUS | Status: DC | PRN

## 2022-10-02 MED ORDER — VITAMIN C 500 MG PO TABS
500.0000 mg | ORAL_TABLET | Freq: Every day | ORAL | Status: DC
  Administered 2022-10-03 – 2022-10-05 (×3): 500 mg via ORAL
  Filled 2022-10-02 (×3): qty 1

## 2022-10-02 MED ORDER — CALCIUM CARBONATE 1250 (500 CA) MG PO TABS
1.0000 | ORAL_TABLET | Freq: Every day | ORAL | Status: DC
  Administered 2022-10-03 – 2022-10-05 (×3): 1250 mg via ORAL
  Filled 2022-10-02 (×3): qty 1

## 2022-10-02 MED ORDER — VITAMIN B COMPLEX PO CAPS: ORAL_CAPSULE | Freq: Every day | ORAL | Status: DC

## 2022-10-02 MED ORDER — FUROSEMIDE 10 MG/ML IJ SOLN
40.0000 mg | Freq: Two times a day (BID) | INTRAMUSCULAR | Status: DC
  Administered 2022-10-02 – 2022-10-04 (×4): 40 mg via INTRAVENOUS
  Filled 2022-10-02 (×4): qty 4

## 2022-10-02 MED ORDER — SODIUM CHLORIDE 0.9% FLUSH
3.0000 mL | Freq: Two times a day (BID) | INTRAVENOUS | Status: DC
  Administered 2022-10-02 – 2022-10-05 (×6): 3 mL via INTRAVENOUS

## 2022-10-02 MED ORDER — TRAMADOL HCL 50 MG PO TABS
50.0000 mg | ORAL_TABLET | Freq: Two times a day (BID) | ORAL | Status: DC | PRN
  Administered 2022-10-04: 50 mg via ORAL
  Filled 2022-10-02: qty 1

## 2022-10-02 MED ORDER — NITROGLYCERIN 0.4 MG SL SUBL: 0.4000 mg | SUBLINGUAL_TABLET | SUBLINGUAL | Status: DC | PRN

## 2022-10-02 MED ORDER — ALPRAZOLAM 0.25 MG PO TABS
0.2500 mg | ORAL_TABLET | Freq: Two times a day (BID) | ORAL | Status: DC | PRN
  Administered 2022-10-02 – 2022-10-05 (×5): 0.25 mg via ORAL
  Filled 2022-10-02 (×6): qty 1

## 2022-10-02 MED ORDER — LORATADINE 10 MG PO TABS
10.0000 mg | ORAL_TABLET | Freq: Every day | ORAL | Status: DC
  Administered 2022-10-03 – 2022-10-05 (×3): 10 mg via ORAL
  Filled 2022-10-02 (×3): qty 1

## 2022-10-02 MED ORDER — ALBUTEROL SULFATE (2.5 MG/3ML) 0.083% IN NEBU: 2.5000 mg | INHALATION_SOLUTION | Freq: Four times a day (QID) | RESPIRATORY_TRACT | Status: DC | PRN

## 2022-10-02 MED ORDER — SIMVASTATIN 20 MG PO TABS
10.0000 mg | ORAL_TABLET | Freq: Every day | ORAL | Status: DC
  Administered 2022-10-03 – 2022-10-05 (×3): 10 mg via ORAL
  Filled 2022-10-02 (×3): qty 1

## 2022-10-02 MED ORDER — UMECLIDINIUM BROMIDE 62.5 MCG/ACT IN AEPB
1.0000 | INHALATION_SPRAY | Freq: Every day | RESPIRATORY_TRACT | Status: DC
  Filled 2022-10-02: qty 7

## 2022-10-02 MED ORDER — SODIUM CHLORIDE 0.9% FLUSH: 3.0000 mL | INTRAVENOUS | Status: DC | PRN

## 2022-10-02 MED ORDER — B COMPLEX-C PO TABS
1.0000 | ORAL_TABLET | Freq: Every day | ORAL | Status: DC
  Administered 2022-10-03 – 2022-10-05 (×3): 1 via ORAL
  Filled 2022-10-02 (×3): qty 1

## 2022-10-02 MED ORDER — POTASSIUM CHLORIDE CRYS ER 10 MEQ PO TBCR
10.0000 meq | EXTENDED_RELEASE_TABLET | Freq: Two times a day (BID) | ORAL | Status: DC
  Administered 2022-10-02 – 2022-10-05 (×6): 10 meq via ORAL
  Filled 2022-10-02 (×6): qty 1

## 2022-10-02 MED ORDER — ALBUTEROL SULFATE HFA 108 (90 BASE) MCG/ACT IN AERS: 2.0000 | INHALATION_SPRAY | Freq: Four times a day (QID) | RESPIRATORY_TRACT | Status: DC | PRN

## 2022-10-02 MED ORDER — ONDANSETRON HCL 4 MG/2ML IJ SOLN: 4.0000 mg | Freq: Four times a day (QID) | INTRAMUSCULAR | Status: DC | PRN

## 2022-10-02 MED ORDER — BUPROPION HCL ER (XL) 150 MG PO TB24
150.0000 mg | ORAL_TABLET | Freq: Every day | ORAL | Status: DC
  Administered 2022-10-03 – 2022-10-05 (×3): 150 mg via ORAL
  Filled 2022-10-02 (×3): qty 1

## 2022-10-02 MED ORDER — ACETAMINOPHEN 500 MG PO TABS
1000.0000 mg | ORAL_TABLET | Freq: Two times a day (BID) | ORAL | Status: DC | PRN
  Administered 2022-10-03: 1000 mg via ORAL
  Filled 2022-10-02: qty 2

## 2022-10-02 MED ORDER — APIXABAN 2.5 MG PO TABS
2.5000 mg | ORAL_TABLET | Freq: Two times a day (BID) | ORAL | Status: DC
  Administered 2022-10-02 – 2022-10-05 (×6): 2.5 mg via ORAL
  Filled 2022-10-02 (×6): qty 1

## 2022-10-02 MED ORDER — DILTIAZEM HCL ER COATED BEADS 120 MG PO CP24
120.0000 mg | ORAL_CAPSULE | Freq: Every day | ORAL | Status: DC
  Administered 2022-10-03 – 2022-10-04 (×2): 120 mg via ORAL
  Filled 2022-10-02 (×2): qty 1

## 2022-10-02 NOTE — Assessment & Plan Note (Addendum)
She is significantly volume overloaded with elevated JVP, weeping lower extremity edema and bibasilar rales. She has gained 7 lbs since her last visit. She has not had adequate response to escalating oral diuretics. She requires admission to the hospital for IV diuresis. She was also seen by Dr. Anne Fu today who agreed. Discussed code status with patient. She desires to be a DNR. Admit to [facility], Cardiac Telemetry Lasix 40 mg IV twice daily  K+ 10 mEq twice daily  CMET, CBC, BNP CXR Echocardiogram

## 2022-10-02 NOTE — Assessment & Plan Note (Signed)
Hx of CTO of RCA. She is not having chest pain to suggest angina. Continue Simvastatin 10 mg once daily. Obtain echocardiogram.

## 2022-10-02 NOTE — Assessment & Plan Note (Signed)
BP somewhat elevated. This will likely improve with diuresis. Continue Cardizem CD 120 mg once daily.

## 2022-10-02 NOTE — H&P (Cosign Needed Addendum)
Cardiology Admission History and Physical   Patient ID: Alexandra Reyes MRN: [id number]; DOB: 04/23/1926   Admission date: 10/02/2022   PCP:  Corwin Levins, MD   [facility] Providers Cardiologist:  Donato Schultz, MD  Electrophysiologist:  Hillis Range, MD (Inactive)       Chief Complaint:  shortness of breath   Patient Profile:  Coronary artery disease Myoview 07/12/15: EF 80, inf-sept scar, no ischemia  CTO of RCA  LHC 07/25/16: pRCA 100, L-R collats, pLAD 20 (HFpEF) heart failure with preserved ejection fraction  TTE 01/03/21: EF 55-60, no RWMA, Gr 1 DD, NL RVSF, NL PASP, AV sclerosis, borderline asc aorta (39 mm) Paroxysmal atrial fibrillation  Supraventricular Tachycardia (AVNRT) S/p RF ablation in 2018 Left Bundle Branch Block  Hypertension  Hyperlipidemia Hyponatremia  Chronic Obstructive Pulmonary Disease  Bronchiectasis  Macular degen Assisted Living: [facility]    History of Present Illness:   Alexandra Reyes  is a 89 y.o. female who returns for f/u of CHF. She was last seen by Dr. Anne Fu 09/17/22. She had been placed on Lasix by pulmonology b/c of volume overload. She had been seen for progressive dyspnea on exertion. ProBNP was ~1300. Her Na was low at 127. She has had some mild hypoNa2+ in the past. Higher dose of Lasix was continued when she saw Dr. Anne Fu. Echocardiogram was ordered but is still pending (10/08/22). She is here with her friend, Alexandra Reyes. She notes worsening shortness of breath, orthopnea, edema since last visit. She has gained 7 lbs. Her legs are now weeping. She had to sit in a chair last night because of shortness of breath. She has not had chest pain, syncope.    Past Medical History:  Diagnosis Date   Age-related macular degeneration, wet, both eyes    Alcohol dependence in remission 05/22/2011   Allergic rhinitis, cause unspecified 05/22/2011   Allergy    Anemia, iron deficiency 05/18/2011   Cholelithiasis    COPD (chronic obstructive  pulmonary disease)    DDD (degenerative disc disease), lumbar 05/18/2011   Depression    "@ times" (09/19/2016)   First degree AV block    GERD (gastroesophageal reflux disease)    Hiatal hernia    History of blood transfusion    "3 or 4 related to childbirth"   HTN (hypertension) 05/18/2011   pt denies this hx on 09/19/2016   Hyperlipidemia 05/18/2011   Macular degeneration    Myocardial infarction    "EKG shows I've had 2; date unknown" (09/19/2016)   OA (osteoarthritis)    "a little here and there; mainly in the back" (09/19/2016)   Osteoporosis 05/18/2011   PAT (paroxysmal atrial tachycardia)    Pectus excavatum 05/18/2011   Personal history of colonic polyps 10/28/2012   Pneumonia    "as a child"   SVT (supraventricular tachycardia)    Urinary frequency     Past Surgical History:  Procedure Laterality Date   ABDOMINAL HYSTERECTOMY  1988   BREAST BIOPSY Right 1948   "it was ok"   CARDIAC CATHETERIZATION     CARDIOVASCULAR STRESS TEST  03/08/2009   EF 84%   CATARACT EXTRACTION W/ INTRAOCULAR LENS  IMPLANT, BILATERAL Bilateral    INGUINAL HERNIA REPAIR Right 06/23/2017   Procedure: REPAIR INCARCERATED  RIGHT FEMORAL HERNIA WITH MESH;  Surgeon: Harriette Bouillon, MD;  Location: [facility] OR;  Service: General;  Laterality: Right;   KNEE ARTHROSCOPY Right    REVERSE SHOULDER ARTHROPLASTY Left 09/05/2020   Procedure: REVERSE SHOULDER  ARTHROPLASTY;  Surgeon: Yolonda Kida, MD;  Location: [facility] OR;  Service: Orthopedics;  Laterality: Left;   RIGHT/LEFT HEART CATH AND CORONARY ANGIOGRAPHY N/A 07/25/2016   Procedure: Right/Left Heart Cath and Coronary Angiography;  Surgeon: Kathleene Hazel, MD;  Location: [facility] INVASIVE CV LAB;  Service: Cardiovascular;  Laterality: N/A;   SVT ABLATION  09/19/2016   SVT ABLATION N/A 09/19/2016   Procedure: SVT Ablation;  Surgeon: Hillis Range, MD;  Location: [facility] INVASIVE CV LAB;  Service: Cardiovascular;  Laterality: N/A;   TONSILLECTOMY AND ADENOIDECTOMY      US ECHOCARDIOGRAPHY  01/17/2003   EF 55-60%     Medications Prior to Admission: Prior to Admission medications   Medication Sig Start Date End Date Taking? Authorizing Provider  acetaminophen (TYLENOL) 500 MG tablet Take 1,000 mg by mouth 2 (two) times daily as needed for moderate pain or headache.    [provider]  albuterol (VENTOLIN HFA) 108 (90 Base) MCG/ACT inhaler Inhale 2 puffs into the lungs every 6 (six) hours as needed for wheezing or shortness of breath. 04/30/21   Icard, Rachel Bo, DO  ALPRAZolam (XANAX) 0.25 MG tablet Take 1 tablet (0.25 mg total) by mouth 2 (two) times daily as needed for anxiety. 10/10/21   Corwin Levins, MD  apixaban (ELIQUIS) 2.5 MG TABS tablet Take 1 tablet (2.5 mg total) by mouth 2 (two) times daily. 05/07/22   Jake Bathe, MD  Ascorbic Acid (VITAMIN C PO) Take 500 mg by mouth daily. Reported on 08/08/2015    [provider]  B Complex Vitamins (VITAMIN B COMPLEX PO) Take 1 tablet by mouth daily.    [provider]  buPROPion (WELLBUTRIN XL) 150 MG 24 hr tablet Take 1 tablet (150 mg total) by mouth daily. 10/10/21   Corwin Levins, MD  calcium carbonate (OS-CAL) 600 MG tablet Take 600 mg by mouth daily.    [provider]  cetirizine (ZYRTEC) 10 MG tablet Take 10 mg by mouth daily.    [provider]  diltiazem (CARDIZEM CD) 120 MG 24 hr capsule Take 1 capsule (120 mg total) by mouth daily. 03/04/22   Allred, Fayrene Fearing, MD  diltiazem (CARDIZEM) 30 MG tablet Take 1 tablet (30 mg total) by mouth 4 (four) times daily as needed. 12/13/20   Allred, Fayrene Fearing, MD  Fluticasone-Umeclidin-Vilant (TRELEGY ELLIPTA) 100-62.5-25 MCG/ACT AEPB Inhale 1 puff into the lungs daily. 09/13/22   Parrett, Virgel Bouquet, NP  furosemide (LASIX) 40 MG tablet Take 1 tablet (40 mg total) by mouth daily. 09/17/22   Jake Bathe, MD  Glucosamine HCl-MSM (GLUCOSAMINE-MSM PO) Take 2 tablets by mouth daily.     [provider]  nitroGLYCERIN (NITROSTAT) 0.4  MG SL tablet Place 1 tablet (0.4 mg total) under the tongue every 5 (five) minutes as needed for chest pain. 05/03/19   Jake Bathe, MD  omeprazole (PRILOSEC) 40 MG capsule TAKE ONE CAPSULE BY MOUTH DAILY 12/05/21   Hilarie Fredrickson, MD  Respiratory Therapy Supplies (FLUTTER) DEVI Use as directed 12/02/18   Bevelyn Ngo, NP  simvastatin (ZOCOR) 10 MG tablet Take 1 tablet (10 mg total) by mouth daily. 07/25/21   Jake Bathe, MD  traMADol (ULTRAM) 50 MG tablet Take 1 tablet (50 mg total) by mouth 2 (two) times daily as needed. 02/18/22   Corwin Levins, MD     Allergies:    Allergies  Allergen Reactions   Citalopram    Citalopram Hydrobromide Other (See Comments)  Oxycodone Other (See Comments)    hallucinations    Social History:   Social History   Socioeconomic History   Marital status: Widowed    Spouse name: Not on file   Number of children: 2   Years of education: 68   Highest education level: Not on file  Occupational History   Occupation: Retired Designer, jewellery  Tobacco Use   Smoking status: Former    Packs/day: 1.50    Years: 36.00    Additional pack years: 0.00    Total pack years: 54.00    Types: Cigarettes    Start date: 03/01/1959    Quit date: 06/10/1994    Years since quitting: 28.3   Smokeless tobacco: Never  Vaping Use   Vaping Use: Never used  Substance and Sexual Activity   Alcohol use: No   Drug use: No   Sexual activity: Never  Other Topics Concern   Not on file  Social History Narrative   Originally from [place], [place]. She moved to [place] in 1950 during the polio epidemic. 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 Remote bird exposure. No mold or hot tub exposure.    Social Determinants of Health   Financial Resource Strain: Low Risk  (12/31/2019)   Overall Financial Resource Strain (CARDIA)    Difficulty of Paying Living Expenses: Not hard at all  Food Insecurity: No Food Insecurity (12/31/2019)   Hunger Vital Sign    Worried About  Running Out of Food in the Last Year: Never true    Ran Out of Food in the Last Year: Never true  Transportation Needs: No Transportation Needs (12/31/2019)   PRAPARE - Administrator, Civil Service (Medical): No    Lack of Transportation (Non-Medical): No  Physical Activity: Inactive (12/29/2017)   Exercise Vital Sign    Days of Exercise per Week: 0 days    Minutes of Exercise per Session: 0 min  Stress: No Stress Concern Present (12/31/2019)   [employer] of Occupational Health - Occupational Stress Questionnaire    Feeling of Stress : Not at all  Social Connections: Moderately Integrated (12/29/2017)   Social Connection and Isolation Panel [NHANES]    Frequency of Communication with Friends and Family: More than three times a week    Frequency of Social Gatherings with Friends and Family: More than three times a week    Attends Religious Services: More than 4 times per year    Active Member of [employer] or Organizations: Yes    Attends Banker Meetings: More than 4 times per year    Marital Status: Widowed  Catering manager Violence: Not on file    Family History:   The patient's family history includes Arthritis in her mother; Heart disease in her father; Heart failure in her mother; Hypertension in her father; Kidney failure in her father.    Review of Systems  Constitutional: Negative for chills and fever.  Respiratory:  Positive for cough. Negative for sputum production.   Gastrointestinal:  Positive for diarrhea. Negative for hematochezia, melena and vomiting.  Genitourinary:  Negative for hematuria.  See HPI  Physical Exam/Data:  There were no vitals filed for this visit. No intake or output data in the 24 hours ending 10/02/22 1519    10/02/2022    2:09 PM 09/17/2022    2:14 PM 09/13/2022    1:57 PM  Last 3 Weights  Weight (lbs) 124 lb 12.8 oz 117 lb 117 lb 6.4 oz  Weight (kg) 56.609 kg 53.071 kg 53.252 kg     VS:  BP (!) 140/88   Pulse 84    Ht 5\' 3"  (1.6 m)   Wt 124 lb 12.8 oz (56.6 kg)   LMP  (LMP Unknown)   SpO2 94%   BMI 22.11 kg/m       Wt Readings from Last 3 Encounters:  10/02/22 124 lb 12.8 oz (56.6 kg)  09/17/22 117 lb (53.1 kg)  09/13/22 117 lb 6.4 oz (53.3 kg)    Constitutional:      Appearance: Healthy appearance. Not in distress.  Neck:     Vascular: JVD normal.     Lymphadenopathy: No cervical adenopathy.  Pulmonary:     Breath sounds: Normal breath sounds. No wheezing. No rales.  Cardiovascular:     Normal rate. Irregularly irregular rhythm.     Murmurs: There is no murmur.  Edema:    Peripheral edema (weeping) present.    Pretibial: 2+ edema of the left pretibial area and 3+ edema of the right pretibial area.    Ankle: bilateral 2+ edema of the ankle.    Feet: bilateral 2+ edema of the feet. Abdominal:     Palpations: Abdomen is soft.  Skin:    General: Skin is warm and dry.  Neurological:     General: No focal deficit present.     Mental Status: Alert and oriented to person, place and time.    EKG:  AFib, HR 85, LBBB    Assessment and Plan:   (HFpEF) heart failure with preserved ejection fraction She is significantly volume overloaded with elevated JVP, weeping lower extremity edema and bibasilar rales. She has gained 7 lbs since her last visit. NYHA III-IV. She has not had adequate response to escalating oral diuretics. She requires admission to the hospital for IV diuresis. She was also seen by Dr. Anne Fu today who agreed. Discussed code status with patient. She desires to be a DNR. Admit to [facility], Cardiac Tele Lasix 40 mg IV twice daily  K+ 10 mEq twice daily  CMET, CBC, BNP CXR Echocardiogram    Paroxysmal atrial fibrillation She is back in AFib today. HR is controlled. She was in 04/2022. EKG in early April 2024 was NSR. It is not clear what role, if any, AFib is playing. If she remains in persistent atrial fibrillation despite adequate diuresis, we could consider DCCV.  Continue Eliquis 2.5 mg twice daily, Cardizem 120 mg once daily.   CAD (coronary artery disease) Hx of CTO of RCA. She is not having chest pain to suggest angina. Continue Simvastatin 10 mg once daily. Obtain echocardiogram.   Essential hypertension BP somewhat elevated. This will likely improve with diuresis. Continue Cardizem CD 120 mg once daily.   Risk Assessment/Risk Scores:       New York Heart Association (NYHA) Functional Class NYHA Class IV  CHA2DS2-VASc Score = 6   This indicates a 9.7% annual risk of stroke. The patient's score is based upon: CHF History: 1 HTN History: 1 Diabetes History: 0 Stroke History: 0 Vascular Disease History: 1 Age Score: 2 Gender Score: 1      Severity of Illness: The appropriate patient status for this patient is INPATIENT. Inpatient status is judged to be reasonable and necessary in order to provide the required intensity of service to ensure the patient's safety. The patient's presenting symptoms, physical exam findings, and initial radiographic and laboratory data in the context of their chronic comorbidities is felt to place  them at high risk for further clinical deterioration. Furthermore, it is not anticipated that the patient will be medically stable for discharge from the hospital within 2 midnights of admission.   * I certify that at the point of admission it is my clinical judgment that the patient will require inpatient hospital care spanning beyond 2 midnights from the point of admission due to high intensity of service, high risk for further deterioration and high frequency of surveillance required.*   For questions or updates, please contact [facility] Please consult [email or web address] for contact info under     Signed, Tereso Newcomer, PA-C  10/02/2022 3:19 PM    Personally seen and examined. Agree with above.  Discussed with her in the clinic setting admission to the hospital.  We decided to do a direct admission  and forego the emergency department.  Unfortunately beds were not available at that time.  She was however to obtain a bed earlier this morning.  Excellent.  She has increasing shortness of breath at rest, IV NYHA class, weeping lower extremities despite increasing her Lasix up from 20 mg to 40 mg daily.  We are admitting her for acute on chronic systolic heart failure with RV involvement/elevated RA pressures.  She will be given IV diuresis.  Understands the risks and benefits of hospitalization.  Although reluctant, she is willing to go.Donato Schultz, MD

## 2022-10-02 NOTE — Progress Notes (Addendum)
Cardiology Office Note:    Date:  10/02/2022  ID:  ALAYNE Reyes, DOB August 04, 1925, MRN [id number] PCP: Corwin Levins, MD  [facility] Providers Cardiologist:  Donato Schultz, MD Electrophysiologist:  Hillis Range, MD (Inactive)       Patient Profile:   Coronary artery disease Myoview 07/12/15: EF 80, inf-sept scar, no ischemia  CTO of RCA  LHC 07/25/16: pRCA 100, L-R collats, pLAD 20 (HFpEF) heart failure with preserved ejection fraction  TTE 01/03/21: EF 55-60, no RWMA, Gr 1 DD, NL RVSF, NL PASP, AV sclerosis, borderline asc aorta (39 mm) Paroxysmal atrial fibrillation  Supraventricular Tachycardia (AVNRT) S/p RF ablation in 2018 Left Bundle Branch Block  Hypertension  Hyperlipidemia Hyponatremia  Chronic Obstructive Pulmonary Disease  Bronchiectasis  Macular degen Assisted Living: [facility]      History of Present Illness:   Alexandra Reyes is a 89 y.o. female who returns for f/u of CHF. She was last seen by Dr. Anne Fu 09/17/22. She had been placed on Lasix by pulmonology b/c of volume overload. She had been seen for progressive dyspnea on exertion. ProBNP was ~1300. Her Na was low at 127. She has had some mild hypoNa2+ in the past. Higher dose of Lasix was continued when she saw Dr. Anne Fu. Echocardiogram was ordered but is still pending (10/08/22). She is here with her friend, Alexandra Reyes. She notes worsening shortness of breath, orthopnea, edema since last visit. She has gained 7 lbs. Her legs are now weeping. She had to sit in a chair last night because of shortness of breath. She has not had chest pain, syncope.   Review of Systems  Constitutional: Negative for chills and fever.  Respiratory:  Positive for cough. Negative for sputum production.   Gastrointestinal:  Positive for diarrhea. Negative for hematochezia, melena and vomiting.  Genitourinary:  Negative for hematuria.  See HPI    Studies Reviewed:    EKG:  AFib, HR 85, LBBB  Risk Assessment/Calculations:     CHA2DS2-VASc Score = 6   This indicates a 9.7% annual risk of stroke. The patient's score is based upon: CHF History: 1 HTN History: 1 Diabetes History: 0 Stroke History: 0 Vascular Disease History: 1 Age Score: 2 Gender Score: 1    HYPERTENSION CONTROL Vitals:   10/02/22 1409 10/02/22 1519  BP: (!) 150/70 (!) 140/88    The patient's blood pressure is elevated above target today.  In order to address the patient's elevated BP: Blood pressure will be monitored at home to determine if medication changes need to be made.          Physical Exam:   VS:  BP (!) 140/88   Pulse 84   Ht  (1.6 m)   Wt 124 lb 12.8 oz (56.6 kg)   LMP  (LMP Unknown)   SpO2 94%   BMI 22.11 kg/m    Wt Readings from Last 3 Encounters:  10/02/22 124 lb 12.8 oz (56.6 kg)  09/17/22 117 lb (53.1 kg)  09/13/22 117 lb 6.4 oz (53.3 kg)    Constitutional:      Appearance: Healthy appearance. Not in distress.  Neck:     Vascular: JVD normal.     Lymphadenopathy: No cervical adenopathy.  Pulmonary:     Breath sounds: Normal breath sounds. No wheezing. No rales.  Cardiovascular:     Normal rate. Irregularly irregular rhythm.     Murmurs: There is no murmur.  Edema:    Peripheral edema (weeping) present.  Pretibial: 2+ edema of the left pretibial area and 3+ edema of the right pretibial area.    Ankle: bilateral 2+ edema of the ankle.    Feet: bilateral 2+ edema of the feet. Abdominal:     Palpations: Abdomen is soft.  Skin:    General: Skin is warm and dry.  Neurological:     General: No focal deficit present.     Mental Status: Alert and oriented to person, place and time.        ASSESSMENT AND PLAN:   (HFpEF) heart failure with preserved ejection fraction She is significantly volume overloaded with elevated JVP, weeping lower extremity edema and bibasilar rales. She has gained 7 lbs since her last visit. She has not had adequate response to escalating oral diuretics. She requires  admission to the hospital for IV diuresis. She was also seen by Dr. Anne Fu today who agreed. Discussed code status with patient. She desires to be a DNR. Admit to [facility], Cardiac Telemetry Lasix 40 mg IV twice daily  K+ 10 mEq twice daily  CMET, CBC, BNP CXR Echocardiogram   Paroxysmal atrial fibrillation She is back in AFib today. HR is controlled. She was in 04/2022. EKG in early April 2024 was NSR. It is not clear what role, if any, AFib is playing. If she remains in persistent atrial fibrillation despite adequate diuresis, we could consider DCCV. Continue Eliquis 2.5 mg twice daily, Cardizem 120 mg once daily.  CAD (coronary artery disease) Hx of CTO of RCA. She is not having chest pain to suggest angina. Continue Simvastatin 10 mg once daily. Obtain echocardiogram.  Essential hypertension BP somewhat elevated. This will likely improve with diuresis. Continue Cardizem CD 120 mg once daily.          Dispo:  Return for Follow up After Dischage from Hospital.  Signed, Tereso Newcomer, [place]

## 2022-10-02 NOTE — Plan of Care (Signed)

## 2022-10-02 NOTE — Patient Instructions (Addendum)
You are going to be direct admitted into the hospital, Floor [phone] You can arrive at [facility] Admission at 4:30   We hope you feel better SOON!

## 2022-10-02 NOTE — Assessment & Plan Note (Signed)
She is back in AFib today. HR is controlled. She was in 04/2022. EKG in early April 2024 was NSR. It is not clear what role, if any, AFib is playing. If she remains in persistent atrial fibrillation despite adequate diuresis, we could consider DCCV. Continue Eliquis 2.5 mg twice daily, Cardizem 120 mg once daily.

## 2022-10-02 NOTE — Progress Notes (Signed)
    Pt arrived for direct admit from home.  H & P and orders reviewed.  No acute complaints or distress.  Plan to initiate IV diuresis tonight.  Discussed w/ pt.  All questions answered.  Nicolasa Ducking, NP 10/02/2022, 8:05 PM

## 2022-10-03 ENCOUNTER — Inpatient Hospital Stay (HOSPITAL_COMMUNITY): Payer: Medicare Other

## 2022-10-03 DIAGNOSIS — I5033 Acute on chronic diastolic (congestive) heart failure: Secondary | ICD-10-CM

## 2022-10-03 LAB — BASIC METABOLIC PANEL
Anion gap: 14 (ref 5–15)
BUN: 21 mg/dL (ref 8–23)
CO2: 19 mmol/L — ABNORMAL LOW (ref 22–32)
Calcium: 8.6 mg/dL — ABNORMAL LOW (ref 8.9–10.3)
Chloride: 95 mmol/L — ABNORMAL LOW (ref 98–111)
Creatinine, Ser: 0.93 mg/dL (ref 0.44–1.00)
GFR, Estimated: 57 mL/min — ABNORMAL LOW (ref >60)
Glucose, Bld: 90 mg/dL (ref 70–99)
Potassium: 3.6 mmol/L (ref 3.5–5.1)
Sodium: 128 mmol/L — ABNORMAL LOW (ref 135–145)

## 2022-10-03 LAB — ECHOCARDIOGRAM COMPLETE
Height: 63 in
MV M vel: 5.26 m/s
MV Peak grad: 110.7 mmHg
S' Lateral: 4.3 cm
Weight: 1915.2 oz

## 2022-10-03 LAB — ECHOCARDIOGRAM LIMITED
Height: 63 in
Weight: 1915.2 oz

## 2022-10-03 MED ORDER — DOCUSATE SODIUM 100 MG PO CAPS
100.0000 mg | ORAL_CAPSULE | Freq: Two times a day (BID) | ORAL | Status: DC
  Administered 2022-10-03 – 2022-10-05 (×5): 100 mg via ORAL
  Filled 2022-10-03 (×5): qty 1

## 2022-10-03 MED ORDER — ORAL CARE MOUTH RINSE: 15.0000 mL | OROMUCOSAL | Status: DC | PRN

## 2022-10-03 NOTE — Plan of Care (Signed)

## 2022-10-03 NOTE — Progress Notes (Signed)
  Echocardiogram 2D Echocardiogram has been performed.  Alexandra Reyes 10/03/2022, 11:52 AM

## 2022-10-03 NOTE — Progress Notes (Addendum)
Rounding Note    Patient Name: Alexandra Reyes Date of Encounter: 10/03/2022  [facility] Cardiologist: Donato Schultz, MD   Subjective   Patient reports that her breathing is a bit better. Continues to have weeping lower extremity edema. No chest pain, palpitations, abdominal distention. Believes her dry weight is around 108 lbs   Inpatient Medications    Scheduled Meds:  apixaban  2.5 mg Oral BID   ascorbic acid  500 mg Oral Daily   B-complex with vitamin C  1 tablet Oral Daily   buPROPion  150 mg Oral Daily   calcium carbonate  1 tablet Oral Q breakfast   diltiazem  120 mg Oral Daily   fluticasone furoate-vilanterol  1 puff Inhalation Daily   And   umeclidinium bromide  1 puff Inhalation Daily   furosemide  40 mg Intravenous BID   loratadine  10 mg Oral Daily   pantoprazole  40 mg Oral Daily   potassium chloride  10 mEq Oral BID   simvastatin  10 mg Oral Daily   sodium chloride flush  3 mL Intravenous Q12H   Continuous Infusions:  sodium chloride     PRN Meds: sodium chloride, acetaminophen, albuterol, ALPRAZolam, nitroGLYCERIN, ondansetron (ZOFRAN) IV, mouth rinse, sodium chloride flush, traMADol   Vital Signs    Vitals:   10/03/22 0031 10/03/22 0340 10/03/22 0349 10/03/22 0744  BP: (!) 140/70  112/69 (!) 156/95  Pulse: 90 90 97 93  Resp: Temp: 97.6 F (36.4 C)  97.7 F (36.5 C) 97.8 F (36.6 C)  TempSrc: Axillary  Axillary Oral  SpO2: 96%  97% 97%  Weight:   54.3 kg   Height:        Intake/Output Summary (Last 24 hours) at 10/03/2022 0906 Last data filed at 10/03/2022 0500 Gross per 24 hour  Intake 300 ml  Output 250 ml  Net 50 ml      10/03/2022    3:49 AM 10/02/2022    6:17 PM 10/02/2022    2:09 PM  Last 3 Weights  Weight (lbs) 119 lb 11.2 oz 124 lb 12.8 oz 124 lb 12.8 oz  Weight (kg) 54.296 kg 56.609 kg 56.609 kg      Telemetry    Atrial fibrillation with HR in the 80s-90s - Personally Reviewed  ECG    EKG from  yesterday is not yet uploaded to system  - Personally Reviewed  Physical Exam   GEN: No acute distress. Laying in bed with head elevated. Eating breakfast  Neck: + JVD Cardiac: Irregular rate and rhythm. no murmurs, rubs, or gallops.  Respiratory: Crackles in bilateral lung bases. Normal work of breathing on room air  GI: Soft, nontender. Distended  MS: 2+ pitting edema in BLE. Lower extremities are weeping \\clear  fluid Neuro:  Nonfocal  Psych: Normal affect   Labs    High Sensitivity Troponin:  No results for input(s): "TROPONINIHS" in the last 720 hours.   Chemistry Recent Labs  Lab 10/02/22 1833 10/03/22 0303  NA 127* 128*  K 3.7 3.6  CL 94* 95*  CO2 21* 19*  GLUCOSE 101* 90  BUN 18 21  CREATININE 1.01* 0.93  CALCIUM 8.5* 8.6*  MG 2.1  --   PROT 5.2*  --   ALBUMIN 3.4*  --   AST 30  --   ALT 25  --   ALKPHOS 64  --   BILITOT 1.1  --   GFRNONAA 52* 57*  ANIONGAP 12 14    Lipids No results for input(s): "CHOL", "TRIG", "HDL", "LABVLDL", "LDLCALC", "CHOLHDL" in the last 168 hours.  Hematology Recent Labs  Lab 10/02/22 1833  WBC 7.0  RBC 3.64*  HGB 11.3*  HCT 31.8*  MCV 87.4  MCH 31.0  MCHC 35.5  RDW 13.9  PLT 335   Thyroid No results for input(s): "TSH", "FREET4" in the last 168 hours.  BNP Recent Labs  Lab 10/02/22 1833  BNP 2,095.8*    DDimer No results for input(s): "DDIMER" in the last 168 hours.   Radiology    DG Chest 2 View  Result Date: 10/03/2022 CLINICAL DATA:  Acute on chronic heart failure. EXAM: CHEST - 2 VIEW COMPARISON:  September 10, 2022 FINDINGS: Enlarged cardiac silhouette. Calcific atherosclerotic disease and tortuosity of the aorta. Bilateral sub pulmonic pleural effusions, small. Mild interstitial pulmonary edema. Osseous structures are without acute abnormality. Left shoulder arthroplasty. Soft tissues are grossly normal. IMPRESSION: 1. Enlarged cardiac silhouette. 2. Mild interstitial pulmonary edema. 3. Small bilateral sub  pulmonic pleural effusions. Electronically Signed   By: Ted Mcalpine M.D.   On: 10/03/2022 08:41    Cardiac Studies     Patient Profile     89 y.o. female with a past medical history of CAD, HFpEF, paroxysmal atrial fibrillation, SVT s/p ablation, LBBB, HTN, HLD, Hyponatremia, COPD, Bronchiectasis, Macular Degeneration. Patient is currently admitted with acute on chronic HFpEF   Assessment & Plan    Acute on Chronic HFpEF - Most recent echocardiogram from 12/2020 showed EF 55-60%, no regional wall motion abnormalities, grade I diastolic dysfunction  - Patient was seen in the office yesterday and was volume overloaded with elevated JVP, weeping lower extremity edema, and bibasilar rales. She was admitted for IV diuretics  - BNP elevated to 2095.8. CXR showed mild intersitial pulmonary edema and sub pulmonic pleural effusions  - Patient now on IV lasix 40 mg BID - Strict I/Os, daily weights, and daily BMPs to assess renal function and electrolytes. - Patient believes her dry weight is around 108 lbs. Weight on admission was 124.8 lbs   - Echocardiogram ordered   Paroxysmal Atrial Fibrillation  - Patient remains in rate-controlled atrial fibrillation with HR in the 90s-100s.  - Echocardiogram pending today  - Continue cardizem cd 120 mg daily  - Continue eliquis 2.5 mg BID (dose based on age, weight)  - Possible that afib is contributing to HF symptoms, but patient is well rate controlled. Continue to follow on telemetry, could consider DCCV if needed   History of CAD  - Patient has a history of CTO of RCA (noted on cath in 2018)  - She denies chest pain  - Continues simvastatin 10 mg once daily  - Echo pending  - Not on ASA as she is on eliquis   HTN  - BP was a bit high overnight. She has not had her BP checked since receiving AM medications  - Follow BP throughout the day. Can increase cardizem if needed   For questions or updates, please contact [facility] Please consult [email or web address] for contact info under     Signed, Jonita Albee, PA-C  10/03/2022, 9:06 AM    Patient seen and examined, note reviewed with the signed Advanced Practice Provider. I personally reviewed laboratory data, imaging studies and relevant notes. I independently examined the patient and formulated the important aspects of the plan. I have personally discussed the plan with the patient and/or family. Comments or  changes to the note/plan are indicated below.  Clinically she appears to be volume overloaded- will continue to diureses aggressively keep on Lasix 40 mg IV BID FU Echocardiogram pending  Still in atrial fibrillation, hoping that she can convert to sinus rhythm after effective diuresis.  Continue her diltiazem 120 mg daily and her Eliquis.  She is DNR.   Thomasene Ripple DO, MS [facility] Attending Cardiologist [facility]  [address] [place], [place] [address] [phone] Website: [email or web address]

## 2022-10-03 NOTE — Plan of Care (Signed)

## 2022-10-04 DIAGNOSIS — I5033 Acute on chronic diastolic (congestive) heart failure: Secondary | ICD-10-CM | POA: Diagnosis not present

## 2022-10-04 LAB — BASIC METABOLIC PANEL WITH GFR
Anion gap: 12 (ref 5–15)
BUN: 21 mg/dL (ref 8–23)
CO2: 24 mmol/L (ref 22–32)
Calcium: 8.9 mg/dL (ref 8.9–10.3)
Chloride: 94 mmol/L — ABNORMAL LOW (ref 98–111)
Creatinine, Ser: 0.98 mg/dL (ref 0.44–1.00)
GFR, Estimated: 53 mL/min — ABNORMAL LOW
Glucose, Bld: 94 mg/dL (ref 70–99)
Potassium: 3.9 mmol/L (ref 3.5–5.1)
Sodium: 130 mmol/L — ABNORMAL LOW (ref 135–145)

## 2022-10-04 MED ORDER — AMIODARONE HCL 200 MG PO TABS
200.0000 mg | ORAL_TABLET | Freq: Every day | ORAL | Status: DC
  Administered 2022-10-04 – 2022-10-05 (×2): 200 mg via ORAL
  Filled 2022-10-04 (×2): qty 1

## 2022-10-04 MED ORDER — FUROSEMIDE 10 MG/ML IJ SOLN
60.0000 mg | Freq: Two times a day (BID) | INTRAMUSCULAR | Status: DC
  Administered 2022-10-04: 60 mg via INTRAVENOUS
  Filled 2022-10-04 (×2): qty 6

## 2022-10-04 NOTE — Care Management Important Message (Signed)
Important Message  Patient Details  Name: Alexandra Reyes MRN: [id number] Date of Birth: [birth year]   Medicare Important Message Given:  Yes     Renie Ora 10/04/2022, 1:22 PM

## 2022-10-04 NOTE — Progress Notes (Signed)
Progress Note  Patient Name: Alexandra Reyes Date of Encounter: 10/04/2022  Primary Cardiologist: Donato Schultz, MD   Subjective   Patient seen and examined at her bedside. She was sitting in bed when I arrived. She is still short of breath.  Inpatient Medications    Scheduled Meds:  apixaban  2.5 mg Oral BID   ascorbic acid  500 mg Oral Daily   B-complex with vitamin C  1 tablet Oral Daily   buPROPion  150 mg Oral Daily   calcium carbonate  1 tablet Oral Q breakfast   diltiazem  120 mg Oral Daily   docusate sodium  100 mg Oral BID   fluticasone furoate-vilanterol  1 puff Inhalation Daily   And   umeclidinium bromide  1 puff Inhalation Daily   furosemide  40 mg Intravenous BID   loratadine  10 mg Oral Daily   pantoprazole  40 mg Oral Daily   potassium chloride  10 mEq Oral BID   simvastatin  10 mg Oral Daily   sodium chloride flush  3 mL Intravenous Q12H   Continuous Infusions:  sodium chloride     PRN Meds: sodium chloride, acetaminophen, albuterol, ALPRAZolam, nitroGLYCERIN, ondansetron (ZOFRAN) IV, mouth rinse, sodium chloride flush, traMADol   Vital Signs    Vitals:   10/03/22 0744 10/03/22 1329 10/03/22 1950 10/04/22 0619  BP: (!) 156/95 131/83 126/79 (!) 145/77  Pulse: 93 95 90 91  Resp: 20 20 (!) 24   Temp: 97.8 F (36.6 C) 97.8 F (36.6 C) 97.8 F (36.6 C) 97.8 F (36.6 C)  TempSrc: Oral Oral Oral Oral  SpO2: 97% 97%  95%  Weight:    52.6 kg  Height:        Intake/Output Summary (Last 24 hours) at 10/04/2022 1004 Last data filed at 10/04/2022 1610 Gross per 24 hour  Intake --  Output 2500 ml  Net -2500 ml   Filed Weights   10/02/22 1817 10/03/22 0349 10/04/22 0619  Weight: 56.6 kg 54.3 kg 52.6 kg    Telemetry    afib - Personally Reviewed  ECG    None today - Personally Reviewed  Physical Exam    General: Comfortable, sitting up in a chair Head: Atraumatic, normal size  Eyes: PEERLA, EOMI  Neck: Supple, normal JVD Cardiac:  Normal S1, S2; RRR; no murmurs, rubs, or gallops Lungs: Clear to auscultation bilaterally Abd: Soft, nontender, no hepatomegaly  Ext: warm, significant anasarca  Musculoskeletal: No deformities, BUE and BLE strength normal and equal Skin: Warm and dry, no rashes   Neuro: Alert and oriented to person, place, time, and situation, CNII-XII grossly intact, no focal deficits  Psych: Normal mood and affect   Labs    Chemistry Recent Labs  Lab 10/02/22 1833 10/03/22 0303 10/04/22 0250  NA 127* 128* 130*  K 3.7 3.6 3.9  CL 94* 95* 94*  CO2 21* 19* 24  GLUCOSE 101* 90 94  BUN 18 21 21   CREATININE 1.01* 0.93 0.98  CALCIUM 8.5* 8.6* 8.9  PROT 5.2*  --   --   ALBUMIN 3.4*  --   --   AST 30  --   --   ALT 25  --   --   ALKPHOS 64  --   --   BILITOT 1.1  --   --   GFRNONAA 52* 57* 53*  ANIONGAP 12 14 12      Hematology Recent Labs  Lab 10/02/22 1833  WBC 7.0  RBC  3.64*  HGB 11.3*  HCT 31.8*  MCV 87.4  MCH 31.0  MCHC 35.5  RDW 13.9  PLT 335    Cardiac EnzymesNo results for input(s): "TROPONINI" in the last 168 hours. No results for input(s): "TROPIPOC" in the last 168 hours.   BNP Recent Labs  Lab 10/02/22 1833  BNP 2,095.8*     DDimer No results for input(s): "DDIMER" in the last 168 hours.   Radiology    ECHOCARDIOGRAM LIMITED  Result Date: 10/03/2022    ECHOCARDIOGRAM LIMITED REPORT   Patient Name:   Alexandra Reyes Date of Exam: 10/03/2022 Medical Rec #:  [id number]        Height:       63.0 in Accession #:    [id number]       Weight:       119.7 lb Date of Birth:  September 26, 1925        BSA:          1.555 m Patient Age:    89 years         BP:           156/95 mmHg Patient Gender: F                HR:           79 bpm. Exam Location:  Inpatient Procedure: Limited Echo, Limited Color Doppler and Cardiac Doppler Indications:    Eval for pericardial effusion  History:        Patient has prior history of Echocardiogram examinations, most                 recent 10/03/2022.  Previous Myocardial Infarction,                 Arrythmias:Atrial Fibrillation; Risk Factors:Hypertension and                 Dyslipidemia.  Sonographer:    Milbert Coulter Referring Phys: [id number] TRACI R TURNER IMPRESSIONS  1. A small to pericardial effusion is present. The pericardial effusion is circumferential. There is no evidence of cardiac tamponade. FINDINGS  Pericardium: A small pericardial effusion is present. The pericardial effusion is circumferential. There is no evidence of cardiac tamponade. Additional Comments: Spectral Doppler performed. Color Doppler performed.  Armanda Magic MD Electronically signed by Armanda Magic MD Signature Date/Time: 10/03/2022/3:23:39 PM    Final    ECHOCARDIOGRAM COMPLETE  Result Date: 10/03/2022    ECHOCARDIOGRAM REPORT   Patient Name:   Alexandra Reyes Date of Exam: 10/03/2022 Medical Rec #:  [id number]        Height:       63.0 in Accession #:    [id number]       Weight:       119.7 lb Date of Birth:  05-20-1926        BSA:          1.555 m Patient Age:    89 years         BP:           156/95 mmHg Patient Gender: F                HR:           89 bpm. Exam Location:  Inpatient Procedure: 2D Echo, Cardiac Doppler and Color Doppler Indications:    CHF  History:        Patient has prior history of Echocardiogram examinations, most  recent 01/03/2021. Previous Myocardial Infarction, COPD,                 Arrythmias:Tachycardia and Atrial Fibrillation; Risk                 Factors:Hypertension and Dyslipidemia. Pectus excavatum.  Sonographer:    Milda Smart Referring Phys: [id number] SCOTT T WEAVER  Sonographer Comments: Technically challenging study due to limited acoustic windows. Image acquisition challenging due to patient body habitus and Image acquisition challenging due to respiratory motion. IMPRESSIONS  1. Left ventricular ejection fraction, by estimation, is 20 to 25%. The left ventricle has severely decreased function. Left ventricular endocardial border  not optimally defined to evaluate regional wall motion. Left ventricular diastolic function could  not be evaluated.  2. Right ventricular systolic function is normal. The right ventricular size is normal. There is moderately elevated pulmonary artery systolic pressure. The estimated right ventricular systolic pressure is 53.1 mmHg.  3. Left atrial size was moderately dilated.  4. Right atrial size was mildly dilated.  5. Moderate pericardial effusion. The pericardial effusion is circumferential.  6. The mitral valve is degenerative. Mild mitral valve regurgitation. No evidence of mitral stenosis.  7. Tricuspid valve regurgitation is moderate.  8. The aortic valve is tricuspid. Aortic valve regurgitation is trivial. Aortic valve sclerosis/calcification is present, without any evidence of aortic stenosis.  9. The inferior vena cava is normal in size with greater than 50% respiratory variability, suggesting right atrial pressure of 3 mmHg. 10. Recommend limited study with definity contrast for better visualization of regional wall motion as well as TV/MV inflow velocities with respirometer. FINDINGS  Left Ventricle: Left ventricular ejection fraction, by estimation, is 20 to 25%. The left ventricle has severely decreased function. Left ventricular endocardial border not optimally defined to evaluate regional wall motion. The left ventricular internal cavity size was normal in size. There is no left ventricular hypertrophy. Abnormal (paradoxical) septal motion, consistent with left bundle branch block. Left ventricular diastolic function could not be evaluated due to atrial fibrillation. Left  ventricular diastolic function could not be evaluated. Right Ventricle: The right ventricular size is normal. No increase in right ventricular wall thickness. Right ventricular systolic function is normal. There is moderately elevated pulmonary artery systolic pressure. The tricuspid regurgitant velocity is 3.54 m/s, and with an  assumed right atrial pressure of 3 mmHg, the estimated right ventricular systolic pressure is 53.1 mmHg. Left Atrium: Left atrial size was moderately dilated. Right Atrium: Right atrial size was mildly dilated. Pericardium: A moderately sized pericardial effusion is present. The pericardial effusion is circumferential. Mitral Valve: The mitral valve is degenerative in appearance. There is mild thickening of the mitral valve leaflet(s). There is mild calcification of the mitral valve leaflet(s). Mild to moderate mitral annular calcification. Mild mitral valve regurgitation. No evidence of mitral valve stenosis. Tricuspid Valve: The tricuspid valve is normal in structure. Tricuspid valve regurgitation is moderate . No evidence of tricuspid stenosis. Aortic Valve: The aortic valve is tricuspid. Aortic valve regurgitation is trivial. Aortic valve sclerosis/calcification is present, without any evidence of aortic stenosis. Pulmonic Valve: The pulmonic valve was normal in structure. Pulmonic valve regurgitation is trivial. No evidence of pulmonic stenosis. Aorta: The aortic root is normal in size and structure. Venous: The inferior vena cava is normal in size with greater than 50% respiratory variability, suggesting right atrial pressure of 3 mmHg. IAS/Shunts: No atrial level shunt detected by color flow Doppler.  LEFT VENTRICLE PLAX 2D LVIDd:  4.80 cm LVIDs:         4.30 cm LV PW:         1.00 cm LV IVS:        1.00 cm LVOT diam:     1.80 cm LVOT Area:     2.54 cm  RIGHT VENTRICLE         IVC TAPSE (M-mode): 1.5 cm  IVC diam: 1.70 cm LEFT ATRIUM             Index        RIGHT ATRIUM           Index LA diam:        3.40 cm 2.19 cm/m   RA Area:     17.40 cm LA Vol (A2C):   64.4 ml 41.42 ml/m  RA Volume:   44.80 ml  28.82 ml/m LA Vol (A4C):   60.6 ml 38.98 ml/m LA Biplane Vol: 65.0 ml 41.81 ml/m   AORTA Ao Root diam: 3.00 cm Ao Asc diam:  3.70 cm MR Peak grad: 110.7 mmHg  TRICUSPID VALVE MR Mean grad: 74.0  mmHg   TR Peak grad:   50.1 mmHg MR Vmax:      526.00 cm/s TR Mean grad:   33.0 mmHg MR Vmean:     409.0 cm/s  TR Vmax:        354.00 cm/s                           TR Vmean:       278.0 cm/s                            SHUNTS                           Systemic Diam: 1.80 cm Armanda Magic MD Electronically signed by Armanda Magic MD Signature Date/Time: 10/03/2022/12:06:07 PM    Final    DG Chest 2 View  Result Date: 10/03/2022 CLINICAL DATA:  Acute on chronic heart failure. EXAM: CHEST - 2 VIEW COMPARISON:  September 10, 2022 FINDINGS: Enlarged cardiac silhouette. Calcific atherosclerotic disease and tortuosity of the aorta. Bilateral sub pulmonic pleural effusions, small. Mild interstitial pulmonary edema. Osseous structures are without acute abnormality. Left shoulder arthroplasty. Soft tissues are grossly normal. IMPRESSION: 1. Enlarged cardiac silhouette. 2. Mild interstitial pulmonary edema. 3. Small bilateral sub pulmonic pleural effusions. Electronically Signed   By: Ted Mcalpine M.D.   On: 10/03/2022 08:41    Cardiac Studies   TTE 10/03/2022 IMPRESSIONS     1. Left ventricular ejection fraction, by estimation, is 20 to 25%. The  left ventricle has severely decreased function. Left ventricular  endocardial border not optimally defined to evaluate regional wall motion.  Left ventricular diastolic function could   not be evaluated.   2. Right ventricular systolic function is normal. The right ventricular  size is normal. There is moderately elevated pulmonary artery systolic  pressure. The estimated right ventricular systolic pressure is 53.1 mmHg.   3. Left atrial size was moderately dilated.   4. Right atrial size was mildly dilated.   5. Moderate pericardial effusion. The pericardial effusion is  circumferential.   6. The mitral valve is degenerative. Mild mitral valve regurgitation. No  evidence of mitral stenosis.   7. Tricuspid valve regurgitation is moderate.   8. The aortic  valve  is tricuspid. Aortic valve regurgitation is trivial.  Aortic valve sclerosis/calcification is present, without any evidence of  aortic stenosis.   9. The inferior vena cava is normal in size with greater than 50%  respiratory variability, suggesting right atrial pressure of 3 mmHg.  10. Recommend limited study with definity contrast for better  visualization of regional wall motion as well as TV/MV inflow velocities  with respirometer.   FINDINGS   Left Ventricle: Left ventricular ejection fraction, by estimation, is 20  to 25%. The left ventricle has severely decreased function. Left  ventricular endocardial border not optimally defined to evaluate regional  wall motion. The left ventricular  internal cavity size was normal in size. There is no left ventricular  hypertrophy. Abnormal (paradoxical) septal motion, consistent with left  bundle branch block. Left ventricular diastolic function could not be  evaluated due to atrial fibrillation. Left   ventricular diastolic function could not be evaluated.   Right Ventricle: The right ventricular size is normal. No increase in  right ventricular wall thickness. Right ventricular systolic function is  normal. There is moderately elevated pulmonary artery systolic pressure.  The tricuspid regurgitant velocity is  3.54 m/s, and with an assumed right atrial pressure of 3 mmHg, the  estimated right ventricular systolic pressure is 53.1 mmHg.   Left Atrium: Left atrial size was moderately dilated.   Right Atrium: Right atrial size was mildly dilated.   Pericardium: A moderately sized pericardial effusion is present. The  pericardial effusion is circumferential.   Mitral Valve: The mitral valve is degenerative in appearance. There is  mild thickening of the mitral valve leaflet(s). There is mild  calcification of the mitral valve leaflet(s). Mild to moderate mitral  annular calcification. Mild mitral valve  regurgitation. No evidence  of mitral valve stenosis.   Tricuspid Valve: The tricuspid valve is normal in structure. Tricuspid  valve regurgitation is moderate . No evidence of tricuspid stenosis.   Aortic Valve: The aortic valve is tricuspid. Aortic valve regurgitation is  trivial. Aortic valve sclerosis/calcification is present, without any  evidence of aortic stenosis.   Pulmonic Valve: The pulmonic valve was normal in structure. Pulmonic valve  regurgitation is trivial. No evidence of pulmonic stenosis.   Aorta: The aortic root is normal in size and structure.   Venous: The inferior vena cava is normal in size with greater than 50%  respiratory variability, suggesting right atrial pressure of 3 mmHg.   IAS/Shunts: No atrial level shunt detected by color flow Doppler.    Patient Profile     89 y.o. female with a past medical history of CAD, HFpEF, paroxysmal atrial fibrillation, SVT s/p ablation, LBBB, HTN, HLD, Hyponatremia, COPD, Bronchiectasis, Macular Degeneration. Patient is currently admitted with acute on chronic HFpEF   Assessment & Plan    Acute exacerbation of heart failure  Paroxysmal atrial fibrillation History of CAD  Hypertension   Total output 2095 ml, still with significant fluid overload. Will increase her lasix from 40 mg BID to 60 mg BID.   Her echo done yesterday showed significant worsening EF now 20-25% was previously normal in 2022. Suspect this may be tachycardia mediated for now will defer any further ischemic evaluation. Will focus on diuresing. GDMT initiation with [facility] tomorrow if blood pressure will tolerate it.   Will stop the Cardizem, will initiate Amiodarone 200 mg BID, once she has been diuresed may benefit from use of beta blocker  Blood pressure is slightly elevated today.    For questions  or updates, please contact [facility] Please consult [email or web address] for contact info under Cardiology/STEMI.      Signed, Lexus Shampine, DO  10/04/2022, 10:04 AM

## 2022-10-04 NOTE — Social Work (Signed)
Pt is from [facility] Phone # is [phone] Fax # is [phone], will need FL2. Pt has a regular diet and is stand by assist at baseline. Facility noted her friend usually transports her, if not able [facility] can transport on the weekdays. TOC will continue to follow for DC needs.

## 2022-10-04 NOTE — Plan of Care (Signed)

## 2022-10-05 DIAGNOSIS — I5033 Acute on chronic diastolic (congestive) heart failure: Secondary | ICD-10-CM | POA: Diagnosis not present

## 2022-10-05 LAB — BASIC METABOLIC PANEL
Anion gap: 10 (ref 5–15)
BUN: 34 mg/dL — ABNORMAL HIGH (ref 8–23)
CO2: 22 mmol/L (ref 22–32)
Calcium: 8.2 mg/dL — ABNORMAL LOW (ref 8.9–10.3)
Chloride: 97 mmol/L — ABNORMAL LOW (ref 98–111)
Creatinine, Ser: 1.1 mg/dL — ABNORMAL HIGH (ref 0.44–1.00)
GFR, Estimated: 47 mL/min — ABNORMAL LOW (ref >60)
Glucose, Bld: 91 mg/dL (ref 70–99)
Potassium: 3.7 mmol/L (ref 3.5–5.1)
Sodium: 129 mmol/L — ABNORMAL LOW (ref 135–145)

## 2022-10-05 MED ORDER — FUROSEMIDE 40 MG PO TABS
40.0000 mg | ORAL_TABLET | Freq: Two times a day (BID) | ORAL | Status: DC
  Administered 2022-10-05: 40 mg via ORAL
  Filled 2022-10-05: qty 1

## 2022-10-05 MED ORDER — AMIODARONE HCL 200 MG PO TABS: 200.0000 mg | ORAL_TABLET | Freq: Every day | ORAL | Status: DC

## 2022-10-05 MED ORDER — SACUBITRIL-VALSARTAN 24-26 MG PO TABS: 1.0000 | ORAL_TABLET | Freq: Two times a day (BID) | ORAL | Status: DC

## 2022-10-05 MED ORDER — POTASSIUM CHLORIDE CRYS ER 10 MEQ PO TBCR: 10.0000 meq | EXTENDED_RELEASE_TABLET | Freq: Two times a day (BID) | ORAL | Status: DC

## 2022-10-05 MED ORDER — FUROSEMIDE 40 MG PO TABS: 40.0000 mg | ORAL_TABLET | Freq: Two times a day (BID) | ORAL | Status: DC

## 2022-10-05 MED ORDER — SACUBITRIL-VALSARTAN 24-26 MG PO TABS
1.0000 | ORAL_TABLET | Freq: Two times a day (BID) | ORAL | Status: DC
  Administered 2022-10-05: 1 via ORAL
  Filled 2022-10-05: qty 1

## 2022-10-05 NOTE — Discharge Summary (Signed)
Discharge Summary    Patient ID: Alexandra Reyes MRN: [id number]; DOB: 05-Sep-1925  Admit date: 10/02/2022 Discharge date: 10/05/2022  PCP:  Corwin Levins, MD   [facility] Providers Cardiologist:  Donato Schultz, MD  Electrophysiologist:  Hillis Range, MD (Inactive)       Discharge Diagnoses    Principal Problem:   Acute on chronic heart failure with preserved ejection fraction (HFpEF) [facility])    Diagnostic Studies/Procedures    10/03/22 TTE  IMPRESSIONS     1. Left ventricular ejection fraction, by estimation, is 20 to 25%. The  left ventricle has severely decreased function. Left ventricular  endocardial border not optimally defined to evaluate regional wall motion.  Left ventricular diastolic function could   not be evaluated.   2. Right ventricular systolic function is normal. The right ventricular  size is normal. There is moderately elevated pulmonary artery systolic  pressure. The estimated right ventricular systolic pressure is 53.1 mmHg.   3. Left atrial size was moderately dilated.   4. Right atrial size was mildly dilated.   5. Moderate pericardial effusion. The pericardial effusion is  circumferential.   6. The mitral valve is degenerative. Mild mitral valve regurgitation. No  evidence of mitral stenosis.   7. Tricuspid valve regurgitation is moderate.   8. The aortic valve is tricuspid. Aortic valve regurgitation is trivial.  Aortic valve sclerosis/calcification is present, without any evidence of  aortic stenosis.   9. The inferior vena cava is normal in size with greater than 50%  respiratory variability, suggesting right atrial pressure of 3 mmHg.  10. Recommend limited study with definity contrast for better  visualization of regional wall motion as well as TV/MV inflow velocities  with respirometer.   FINDINGS   Left Ventricle: Left ventricular ejection fraction, by estimation, is 20  to 25%. The left ventricle has severely decreased  function. Left  ventricular endocardial border not optimally defined to evaluate regional  wall motion. The left ventricular  internal cavity size was normal in size. There is no left ventricular  hypertrophy. Abnormal (paradoxical) septal motion, consistent with left  bundle branch block. Left ventricular diastolic function could not be  evaluated due to atrial fibrillation. Left   ventricular diastolic function could not be evaluated.   Right Ventricle: The right ventricular size is normal. No increase in  right ventricular wall thickness. Right ventricular systolic function is  normal. There is moderately elevated pulmonary artery systolic pressure.  The tricuspid regurgitant velocity is  3.54 m/s, and with an assumed right atrial pressure of 3 mmHg, the  estimated right ventricular systolic pressure is 53.1 mmHg.   Left Atrium: Left atrial size was moderately dilated.   Right Atrium: Right atrial size was mildly dilated.   Pericardium: A moderately sized pericardial effusion is present. The  pericardial effusion is circumferential.   Mitral Valve: The mitral valve is degenerative in appearance. There is  mild thickening of the mitral valve leaflet(s). There is mild  calcification of the mitral valve leaflet(s). Mild to moderate mitral  annular calcification. Mild mitral valve  regurgitation. No evidence of mitral valve stenosis.   Tricuspid Valve: The tricuspid valve is normal in structure. Tricuspid  valve regurgitation is moderate . No evidence of tricuspid stenosis.   Aortic Valve: The aortic valve is tricuspid. Aortic valve regurgitation is  trivial. Aortic valve sclerosis/calcification is present, without any  evidence of aortic stenosis.   Pulmonic Valve: The pulmonic valve was normal in structure. Pulmonic valve  regurgitation is trivial. No evidence of pulmonic stenosis.   Aorta: The aortic root is normal in size and structure.   Venous: The inferior vena cava is  normal in size with greater than 50%  respiratory variability, suggesting right atrial pressure of 3 mmHg.   IAS/Shunts: No atrial level shunt detected by color flow Doppler.   10/03/22 TTE Limited  IMPRESSIONS     1. A small to pericardial effusion is present. The pericardial effusion  is circumferential. There is no evidence of cardiac tamponade.   FINDINGS   Pericardium: A small pericardial effusion is present. The pericardial  effusion is circumferential. There is no evidence of cardiac tamponade.   Additional Comments: Spectral Doppler performed. Color Doppler performed.  _____________   History of Present Illness     Alexandra Reyes is a 89 y.o. female with CAD, HFpEF, paroxysmal afib, AVNRT (s/p RF ablation in 2018), LBBB, hypertension, hyperlipidemia, hyponatremia, COPD, bronchiectasis, macular degeneration. Patient presented for follow up of CHF in clinic on 4/24. She was last seen by Dr. Anne Fu 09/17/22. She had been placed on Lasix by pulmonology b/c of volume overload. She had been seen for progressive dyspnea on exertion. ProBNP was ~1300. Her Na was low at 127. She has had some mild hypoNa2+ in the past. Higher dose of Lasix was continued when she saw Dr. Anne Fu. In clinic on 4/24, patient found with worsening shortness of breath, orthopnea, edema since last visit. She had gained 7 lbs. Her legs are now weeping. She had to sit in a chair the night prior to office visit because of shortness of breath. She had not had chest pain, syncope.   Hospital Course     Consultants: n/a  Acute HFrEF  Patient with significant volume overload on physical exam in office on 4/24 with elevated JVP, weeping lower extremity edema, and bibasilar rales. Due to presentation, decision made to admit patient to the hospital for IV diuresis. TTE found newly reduced LVEF 20-25%.   Patient neg negative 4L this admission. Weight down from 56.6kg to 52.5kg and on day of discharge, patient without  dyspnea.  Reduced EF suspected to be tachy-mediated, ischemic evaluation deferred per [facility] team. On day of discharge, transitioned to oral Lasix 40mg  BID with potassium chloride BID.   Initiated on Entresto 24-26mg  BID. Could consider adding beta blocker and spironolactone in outpatient setting.  Patient seen on day of discharge by Dr. Eden Emms and deemed stable for discharge home. Close follow up has been arranged.   Paroxysmal atrial fibrillation  Patient noted to be back in afib, with controlled HR on day of admission. She was taken off Diltiazem on 4/26 with TTE showing newly reduced LVEF.   Patient now on Amiodarone 200mg , maintaining stable rates. Continue at discharge. Continue Eliquis 2.5mg  BID.  CAD  Hx CTO of RCA. Continues without anginal symptoms. Continue simvastatin 10mg .   Essential hypertension  BP stable at time of discharge. Continue Entresto 24-26mg  BID and Lasix 40mg  BID.       Did the patient have an acute coronary syndrome (MI, NSTEMI, STEMI, etc) this admission?:  No                               Did the patient have a percutaneous coronary intervention (stent / angioplasty)?:  No.    _____________  Discharge Vitals Blood pressure (!) 143/98, pulse 92, temperature 97.6 F (36.4 C), temperature source Oral, resp.  rate (!) 24, height 5\' 3"  (1.6 m), weight 52.5 kg, SpO2 96 %.  Filed Weights   10/03/22 0349 10/04/22 0619 10/05/22 0540  Weight: 54.3 kg 52.6 kg 52.5 kg   See same day rounding note from attending cardiologist for physical exam.  Labs & Radiologic Studies    CBC Recent Labs    10/02/22 1833  WBC 7.0  NEUTROABS 5.5  HGB 11.3*  HCT 31.8*  MCV 87.4  PLT 335   Basic Metabolic Panel Recent Labs    40/98/11 1833 10/03/22 0303 10/04/22 0250 10/05/22 0209  NA 127*   < > 130* 129*  K 3.7   < > 3.9 3.7  CL 94*   < > 94* 97*  CO2 21*   < > 24 22  GLUCOSE 101*   < > 94 91  BUN 18   < > 21 34*  CREATININE 1.01*   < > 0.98  1.10*  CALCIUM 8.5*   < > 8.9 8.2*  MG 2.1  --   --   --    < > = values in this interval not displayed.   Liver Function Tests Recent Labs    10/02/22 1833  AST 30  ALT 25  ALKPHOS 64  BILITOT 1.1  PROT 5.2*  ALBUMIN 3.4*   No results for input(s): "LIPASE", "AMYLASE" in the last 72 hours. High Sensitivity Troponin:   No results for input(s): "TROPONINIHS" in the last 720 hours.  BNP Invalid input(s): "POCBNP" D-Dimer No results for input(s): "DDIMER" in the last 72 hours. Hemoglobin A1C No results for input(s): "HGBA1C" in the last 72 hours. Fasting Lipid Panel No results for input(s): "CHOL", "HDL", "LDLCALC", "TRIG", "CHOLHDL", "LDLDIRECT" in the last 72 hours. Thyroid Function Tests No results for input(s): "TSH", "T4TOTAL", "T3FREE", "THYROIDAB" in the last 72 hours.  Invalid input(s): "FREET3" _____________  ECHOCARDIOGRAM LIMITED  Result Date: 10/03/2022    ECHOCARDIOGRAM LIMITED REPORT   Patient Name:   Alexandra Reyes Date of Exam: 10/03/2022 Medical Rec #:  [id number]        Height:       63.0 in Accession #:    [id number]       Weight:       119.7 lb Date of Birth:  [birth year]        BSA:          1.555 m Patient Age:    89 years         BP:           156/95 mmHg Patient Gender: F                HR:           79 bpm. Exam Location:  Inpatient Procedure: Limited Echo, Limited Color Doppler and Cardiac Doppler Indications:    Eval for pericardial effusion  History:        Patient has prior history of Echocardiogram examinations, most                 recent 10/03/2022. Previous Myocardial Infarction,                 Arrythmias:Atrial Fibrillation; Risk Factors:Hypertension and                 Dyslipidemia.  Sonographer:    Milbert Coulter Referring Phys: [id number] TRACI R TURNER IMPRESSIONS  1. A small to pericardial effusion is present. The pericardial effusion is circumferential. There is no evidence of  cardiac tamponade. FINDINGS  Pericardium: A small pericardial effusion is  present. The pericardial effusion is circumferential. There is no evidence of cardiac tamponade. Additional Comments: Spectral Doppler performed. Color Doppler performed.  Armanda Magic MD Electronically signed by Armanda Magic MD Signature Date/Time: 10/03/2022/3:23:39 PM    Final    ECHOCARDIOGRAM COMPLETE  Result Date: 10/03/2022    ECHOCARDIOGRAM REPORT   Patient Name:   Alexandra Reyes Date of Exam: 10/03/2022 Medical Rec #:  [id number]        Height:       63.0 in Accession #:    [id number]       Weight:       119.7 lb Date of Birth:  27-Aug-1925        BSA:          1.555 m Patient Age:    89 years         BP:           156/95 mmHg Patient Gender: F                HR:           89 bpm. Exam Location:  Inpatient Procedure: 2D Echo, Cardiac Doppler and Color Doppler Indications:    CHF  History:        Patient has prior history of Echocardiogram examinations, most                 recent 01/03/2021. Previous Myocardial Infarction, COPD,                 Arrythmias:Tachycardia and Atrial Fibrillation; Risk                 Factors:Hypertension and Dyslipidemia. Pectus excavatum.  Sonographer:    Milda Smart Referring Phys: [id number] SCOTT T WEAVER  Sonographer Comments: Technically challenging study due to limited acoustic windows. Image acquisition challenging due to patient body habitus and Image acquisition challenging due to respiratory motion. IMPRESSIONS  1. Left ventricular ejection fraction, by estimation, is 20 to 25%. The left ventricle has severely decreased function. Left ventricular endocardial border not optimally defined to evaluate regional wall motion. Left ventricular diastolic function could  not be evaluated.  2. Right ventricular systolic function is normal. The right ventricular size is normal. There is moderately elevated pulmonary artery systolic pressure. The estimated right ventricular systolic pressure is 53.1 mmHg.  3. Left atrial size was moderately dilated.  4. Right atrial size was  mildly dilated.  5. Moderate pericardial effusion. The pericardial effusion is circumferential.  6. The mitral valve is degenerative. Mild mitral valve regurgitation. No evidence of mitral stenosis.  7. Tricuspid valve regurgitation is moderate.  8. The aortic valve is tricuspid. Aortic valve regurgitation is trivial. Aortic valve sclerosis/calcification is present, without any evidence of aortic stenosis.  9. The inferior vena cava is normal in size with greater than 50% respiratory variability, suggesting right atrial pressure of 3 mmHg. 10. Recommend limited study with definity contrast for better visualization of regional wall motion as well as TV/MV inflow velocities with respirometer. FINDINGS  Left Ventricle: Left ventricular ejection fraction, by estimation, is 20 to 25%. The left ventricle has severely decreased function. Left ventricular endocardial border not optimally defined to evaluate regional wall motion. The left ventricular internal cavity size was normal in size. There is no left ventricular hypertrophy. Abnormal (paradoxical) septal motion, consistent with left bundle branch block. Left ventricular diastolic function could not be evaluated due to atrial  fibrillation. Left  ventricular diastolic function could not be evaluated. Right Ventricle: The right ventricular size is normal. No increase in right ventricular wall thickness. Right ventricular systolic function is normal. There is moderately elevated pulmonary artery systolic pressure. The tricuspid regurgitant velocity is 3.54 m/s, and with an assumed right atrial pressure of 3 mmHg, the estimated right ventricular systolic pressure is 53.1 mmHg. Left Atrium: Left atrial size was moderately dilated. Right Atrium: Right atrial size was mildly dilated. Pericardium: A moderately sized pericardial effusion is present. The pericardial effusion is circumferential. Mitral Valve: The mitral valve is degenerative in appearance. There is mild  thickening of the mitral valve leaflet(s). There is mild calcification of the mitral valve leaflet(s). Mild to moderate mitral annular calcification. Mild mitral valve regurgitation. No evidence of mitral valve stenosis. Tricuspid Valve: The tricuspid valve is normal in structure. Tricuspid valve regurgitation is moderate . No evidence of tricuspid stenosis. Aortic Valve: The aortic valve is tricuspid. Aortic valve regurgitation is trivial. Aortic valve sclerosis/calcification is present, without any evidence of aortic stenosis. Pulmonic Valve: The pulmonic valve was normal in structure. Pulmonic valve regurgitation is trivial. No evidence of pulmonic stenosis. Aorta: The aortic root is normal in size and structure. Venous: The inferior vena cava is normal in size with greater than 50% respiratory variability, suggesting right atrial pressure of 3 mmHg. IAS/Shunts: No atrial level shunt detected by color flow Doppler.  LEFT VENTRICLE PLAX 2D LVIDd:         4.80 cm LVIDs:         4.30 cm LV PW:         1.00 cm LV IVS:        1.00 cm LVOT diam:     1.80 cm LVOT Area:     2.54 cm  RIGHT VENTRICLE         IVC TAPSE (M-mode): 1.5 cm  IVC diam: 1.70 cm LEFT ATRIUM             Index        RIGHT ATRIUM           Index LA diam:        3.40 cm 2.19 cm/m   RA Area:     17.40 cm LA Vol (A2C):   64.4 ml 41.42 ml/m  RA Volume:   44.80 ml  28.82 ml/m LA Vol (A4C):   60.6 ml 38.98 ml/m LA Biplane Vol: 65.0 ml 41.81 ml/m   AORTA Ao Root diam: 3.00 cm Ao Asc diam:  3.70 cm MR Peak grad: 110.7 mmHg  TRICUSPID VALVE MR Mean grad: 74.0 mmHg   TR Peak grad:   50.1 mmHg MR Vmax:      526.00 cm/s TR Mean grad:   33.0 mmHg MR Vmean:     409.0 cm/s  TR Vmax:        354.00 cm/s                           TR Vmean:       278.0 cm/s                            SHUNTS                           Systemic Diam: 1.80 cm Armanda Magic MD Electronically signed by Armanda Magic MD Signature Date/Time: 10/03/2022/12:06:07  PM    Final    DG Chest 2  View  Result Date: 10/03/2022 CLINICAL DATA:  Acute on chronic heart failure. EXAM: CHEST - 2 VIEW COMPARISON:  September 10, 2022 FINDINGS: Enlarged cardiac silhouette. Calcific atherosclerotic disease and tortuosity of the aorta. Bilateral sub pulmonic pleural effusions, small. Mild interstitial pulmonary edema. Osseous structures are without acute abnormality. Left shoulder arthroplasty. Soft tissues are grossly normal. IMPRESSION: 1. Enlarged cardiac silhouette. 2. Mild interstitial pulmonary edema. 3. Small bilateral sub pulmonic pleural effusions. Electronically Signed   By: Ted Mcalpine M.D.   On: 10/03/2022 08:41   DG Chest 2 View  Result Date: 09/10/2022 CLINICAL DATA:  Bronchiectasis hemoptysis short of breath EXAM: CHEST - 2 VIEW COMPARISON:  Chest CT 09/03/2020 FINDINGS: Left shoulder replacement. Mild cardiomegaly. Aortic atherosclerosis. No acute airspace disease, pleural effusion or pneumothorax. Slightly coarse interstitial opacities appear chronic. The patient's chin obscures the apices. Mild atelectasis or scarring at the bases. IMPRESSION: No active cardiopulmonary disease. Mild cardiomegaly. Mild atelectasis or scarring at the bases with coarse chronic appearing opacities. Electronically Signed   By: Jasmine Pang M.D.   On: 09/10/2022 15:31   Disposition   Pt is being discharged home today in good condition.  Follow-up Plans & Appointments    Cardiology follow up with Tereso Newcomer PA-C on 5/6 at 2:45pm.     Discharge Medications   Allergies as of 10/05/2022       Reactions   Citalopram    Citalopram Hydrobromide Other (See Comments)   Oxycodone Other (See Comments)   hallucinations        Medication List     STOP taking these medications    diltiazem 120 MG 24 hr capsule Commonly known as: CARDIZEM CD   diltiazem 30 MG tablet Commonly known as: CARDIZEM       TAKE these medications    acetaminophen 500 MG tablet Commonly known as: TYLENOL Take  1,000 mg by mouth 2 (two) times daily as needed for moderate pain or headache.   albuterol 108 (90 Base) MCG/ACT inhaler Commonly known as: VENTOLIN HFA Inhale 2 puffs into the lungs every 6 (six) hours as needed for wheezing or shortness of breath.   ALPRAZolam 0.25 MG tablet Commonly known as: XANAX Take 1 tablet (0.25 mg total) by mouth 2 (two) times daily as needed for anxiety.   amiodarone 200 MG tablet Commonly known as: PACERONE Take 1 tablet (200 mg total) by mouth daily. Start taking on: October 06, 2022   apixaban 2.5 MG Tabs tablet Commonly known as: ELIQUIS Take 1 tablet (2.5 mg total) by mouth 2 (two) times daily.   buPROPion 150 MG 24 hr tablet Commonly known as: Wellbutrin XL Take 1 tablet (150 mg total) by mouth daily.   calcium carbonate 600 MG tablet Commonly known as: OS-CAL Take 600 mg by mouth daily.   cetirizine 10 MG tablet Commonly known as: ZYRTEC Take 10 mg by mouth daily.   furosemide 40 MG tablet Commonly known as: LASIX Take 1 tablet (40 mg total) by mouth 2 (two) times daily. What changed: when to take this   GLUCOSAMINE-MSM PO Take 2 tablets by mouth daily.   nitroGLYCERIN 0.4 MG SL tablet Commonly known as: NITROSTAT Place 1 tablet (0.4 mg total) under the tongue every 5 (five) minutes as needed for chest pain. What changed: reasons to take this   omeprazole 40 MG capsule Commonly known as: PRILOSEC TAKE ONE CAPSULE BY MOUTH DAILY   potassium  chloride 10 MEQ tablet Commonly known as: KLOR-CON M Take 1 tablet (10 mEq total) by mouth 2 (two) times daily.   sacubitril-valsartan 24-26 MG Commonly known as: ENTRESTO Take 1 tablet by mouth 2 (two) times daily.   simvastatin 10 MG tablet Commonly known as: ZOCOR Take 1 tablet (10 mg total) by mouth daily.   traMADol 50 MG tablet Commonly known as: ULTRAM Take 1 tablet (50 mg total) by mouth 2 (two) times daily as needed.   Trelegy Ellipta 100-62.5-25 MCG/ACT Aepb Generic drug:  Fluticasone-Umeclidin-Vilant Inhale 1 puff into the lungs daily.   VITAMIN B COMPLEX PO Take 1 tablet by mouth daily.   VITAMIN C PO Take 500 mg by mouth daily. Reported on 08/08/2015           Outstanding Labs/Studies   Patient will need repeat BMP at her facility this week.   Duration of Discharge Encounter   Greater than 30 minutes including physician time.  Con Memos, PA-C 10/05/2022, 9:48 AM

## 2022-10-05 NOTE — TOC Progression Note (Addendum)
Transition of Care [facility]) - Progression Note    Patient Details  Name: Alexandra Reyes MRN: [id number] Date of Birth: 12-18-1925  [facility]) CM/SW Contact  Patrice Paradise, LCSW Phone Number: 10/05/2022, 10:54 AM  Clinical Narrative:     CSW met with pt to discuss impending DC. CSW confirmed that pt's friend Meriam Sprague who is in the room will transport her back to [facility]. Pt explained that she wants to wait until she eats lunch. Per previous note CSW faxed DC summary and Fl2 to facility. CSW attempted to call facility several times however was sent to [facility].   CSW followed up with pt and explained that CSW is unable to get in touch with the facility to discuss the discharge back to facility. Pt explained that she does not want to stay another night at the hospital. CSW informed pt that she will continue to reach out to facility.  Attempts were made to call [facility] and Coralie Carpen with no answers. CSW also called the on call RN and had to leave a message as well.  TOC team will continue to assist with discharge planning needs.        Expected Discharge Plan and Services         Expected Discharge Date: 10/05/22                                     Social Determinants of Health (SDOH) Interventions SDOH Screenings   Food Insecurity: No Food Insecurity (10/02/2022)  Housing: Low Risk  (10/02/2022)  Transportation Needs: No Transportation Needs (10/02/2022)  Utilities: Not At Risk (10/02/2022)  Alcohol Screen: Low Risk  (12/31/2019)  Depression (PHQ2-9): Low Risk  (07/15/2022)  Financial Resource Strain: Low Risk  (12/31/2019)  Physical Activity: Inactive (12/29/2017)  Social Connections: Moderately Integrated (12/29/2017)  Stress: No Stress Concern Present (12/31/2019)  Tobacco Use: Medium Risk (10/02/2022)    Readmission Risk Interventions   No data to display

## 2022-10-05 NOTE — TOC Transition Note (Addendum)
Transition of Care [facility]) - CM/SW Discharge Note   Patient Details  Name: Alexandra Reyes MRN: [id number] Date of Birth: [birth year]  [facility] CM/SW Contact:  Patrice Paradise, LCSW Phone Number: 10/05/2022, 1:50 PM   Clinical Narrative:     CSW finally received a call back from the Executive Director Cassandra and she states that pt can return. CSW expressed the concerns of not being able to reach facility and she confirmed the calls should have gone to a cell phone.  Patient will DC to:?[facility] Anticipated DC date:?10/05/2022 Transport by: Kandace Parkins   Per MD patient ready for DC to [facility]. RN, patient, patient's family, and facility notified of DC. Discharge Summary sent to facility. RN given number for report  [phone] ask for Cassandra . DC packet on chart. [facility] to transport pt to facility.  CSW signing off.   Judd Lien, [place] [phone]         Patient Goals and CMS Choice      Discharge Placement                         Discharge Plan and Services Additional resources added to the After Visit Summary for                                       Social Determinants of Health (SDOH) Interventions SDOH Screenings   Food Insecurity: No Food Insecurity (10/02/2022)  Housing: Low Risk  (10/02/2022)  Transportation Needs: No Transportation Needs (10/02/2022)  Utilities: Not At Risk (10/02/2022)  Alcohol Screen: Low Risk  (12/31/2019)  Depression (PHQ2-9): Low Risk  (07/15/2022)  Financial Resource Strain: Low Risk  (12/31/2019)  Physical Activity: Inactive (12/29/2017)  Social Connections: Moderately Integrated (12/29/2017)  Stress: No Stress Concern Present (12/31/2019)  Tobacco Use: Medium Risk (10/02/2022)     Readmission Risk Interventions   No data to display

## 2022-10-05 NOTE — Progress Notes (Signed)
Progress Note  Patient Name: Alexandra Reyes Date of Encounter: 10/05/2022  Primary Cardiologist: Donato Schultz, MD   Subjective   No dyspnea eating breakfast Indicates one of two friends can take her back to [place] as they don't offer transportation over weekend  Inpatient Medications    Scheduled Meds:  amiodarone  200 mg Oral Daily   apixaban  2.5 mg Oral BID   ascorbic acid  500 mg Oral Daily   B-complex with vitamin C  1 tablet Oral Daily   buPROPion  150 mg Oral Daily   calcium carbonate  1 tablet Oral Q breakfast   docusate sodium  100 mg Oral BID   fluticasone furoate-vilanterol  1 puff Inhalation Daily   And   umeclidinium bromide  1 puff Inhalation Daily   furosemide  60 mg Intravenous BID   loratadine  10 mg Oral Daily   pantoprazole  40 mg Oral Daily   potassium chloride  10 mEq Oral BID   simvastatin  10 mg Oral Daily   sodium chloride flush  3 mL Intravenous Q12H   Continuous Infusions:  sodium chloride     PRN Meds: sodium chloride, acetaminophen, albuterol, ALPRAZolam, nitroGLYCERIN, ondansetron (ZOFRAN) IV, mouth rinse, sodium chloride flush, traMADol   Vital Signs    Vitals:   10/05/22 0000 10/05/22 0540 10/05/22 0550 10/05/22 0803  BP: 126/70 (!) 110/90 133/82 (!) 143/98  Pulse:  78 77 92  Resp: 20 20 16  (!) 24  Temp: (!) 97.5 F (36.4 C) 97.6 F (36.4 C)  97.6 F (36.4 C)  TempSrc: Oral Oral  Oral  SpO2: 95% 95% 95% 96%  Weight:  52.5 kg    Height:        Intake/Output Summary (Last 24 hours) at 10/05/2022 0817 Last data filed at 10/05/2022 0804 Gross per 24 hour  Intake 240 ml  Output 1700 ml  Net -1460 ml   Filed Weights   10/03/22 0349 10/04/22 0619 10/05/22 0540  Weight: 54.3 kg 52.6 kg 52.5 kg    Telemetry    afib - Personally Reviewed  ECG    None today - Personally Reviewed  Physical Exam    General: Comfortable, sitting up in a chair Head: Atraumatic, normal size  Eyes: PEERLA, EOMI  Neck: Supple, normal  JVD Cardiac: Normal S1, S2; RRR; no murmurs, rubs, or gallops Lungs: Clear to auscultation bilaterally Abd: Soft, nontender, no hepatomegaly  Ext: warm, significant anasarca  Musculoskeletal: No deformities, BUE and BLE strength normal and equal Skin: Warm and dry, no rashes   Neuro: Alert and oriented to person, place, time, and situation, CNII-XII grossly intact, no focal deficits  Psych: Normal mood and affect   Labs    Chemistry Recent Labs  Lab 10/02/22 1833 10/03/22 0303 10/04/22 0250 10/05/22 0209  NA 127* 128* 130* 129*  K 3.7 3.6 3.9 3.7  CL 94* 95* 94* 97*  CO2 21* 19* 24 22  GLUCOSE 101* 90 94 91  BUN 18 21 21  34*  CREATININE 1.01* 0.93 0.98 1.10*  CALCIUM 8.5* 8.6* 8.9 8.2*  PROT 5.2*  --   --   --   ALBUMIN 3.4*  --   --   --   AST 30  --   --   --   ALT 25  --   --   --   ALKPHOS 64  --   --   --   BILITOT 1.1  --   --   --  GFRNONAA 52* 57* 53* 47*  ANIONGAP 12 14 12 10      Hematology Recent Labs  Lab 10/02/22 1833  WBC 7.0  RBC 3.64*  HGB 11.3*  HCT 31.8*  MCV 87.4  MCH 31.0  MCHC 35.5  RDW 13.9  PLT 335    Cardiac EnzymesNo results for input(s): "TROPONINI" in the last 168 hours. No results for input(s): "TROPIPOC" in the last 168 hours.   BNP Recent Labs  Lab 10/02/22 1833  BNP 2,095.8*     DDimer No results for input(s): "DDIMER" in the last 168 hours.   Radiology    ECHOCARDIOGRAM LIMITED  Result Date: 10/03/2022    ECHOCARDIOGRAM LIMITED REPORT   Patient Name:   Alexandra Reyes Date of Exam: 10/03/2022 Medical Rec #:  [id number]        Height:       63.0 in Accession #:    [id number]       Weight:       119.7 lb Date of Birth:  06/28/1925        BSA:          1.555 m Patient Age:    89 years         BP:           156/95 mmHg Patient Gender: F                HR:           79 bpm. Exam Location:  Inpatient Procedure: Limited Echo, Limited Color Doppler and Cardiac Doppler Indications:    Eval for pericardial effusion  History:         Patient has prior history of Echocardiogram examinations, most                 recent 10/03/2022. Previous Myocardial Infarction,                 Arrythmias:Atrial Fibrillation; Risk Factors:Hypertension and                 Dyslipidemia.  Sonographer:    Milbert Coulter Referring Phys: [id number] TRACI R TURNER IMPRESSIONS  1. A small to pericardial effusion is present. The pericardial effusion is circumferential. There is no evidence of cardiac tamponade. FINDINGS  Pericardium: A small pericardial effusion is present. The pericardial effusion is circumferential. There is no evidence of cardiac tamponade. Additional Comments: Spectral Doppler performed. Color Doppler performed.  Armanda Magic MD Electronically signed by Armanda Magic MD Signature Date/Time: 10/03/2022/3:23:39 PM    Final    ECHOCARDIOGRAM COMPLETE  Result Date: 10/03/2022    ECHOCARDIOGRAM REPORT   Patient Name:   [name] Date of Exam: 10/03/2022 Medical Rec #:  [id number]        Height:       63.0 in Accession #:    [id number]       Weight:       119.7 lb Date of Birth:  27-Aug-1925        BSA:          1.555 m Patient Age:    89 years         BP:           156/95 mmHg Patient Gender: F                HR:           89 bpm. Exam Location:  Inpatient Procedure: 2D Echo, Cardiac Doppler and Color Doppler  Indications:    CHF  History:        Patient has prior history of Echocardiogram examinations, most                 recent 01/03/2021. Previous Myocardial Infarction, COPD,                 Arrythmias:Tachycardia and Atrial Fibrillation; Risk                 Factors:Hypertension and Dyslipidemia. Pectus excavatum.  Sonographer:    Milda Smart Referring Phys: [id number] SCOTT T WEAVER  Sonographer Comments: Technically challenging study due to limited acoustic windows. Image acquisition challenging due to patient body habitus and Image acquisition challenging due to respiratory motion. IMPRESSIONS  1. Left ventricular ejection fraction, by estimation, is 20  to 25%. The left ventricle has severely decreased function. Left ventricular endocardial border not optimally defined to evaluate regional wall motion. Left ventricular diastolic function could  not be evaluated.  2. Right ventricular systolic function is normal. The right ventricular size is normal. There is moderately elevated pulmonary artery systolic pressure. The estimated right ventricular systolic pressure is 53.1 mmHg.  3. Left atrial size was moderately dilated.  4. Right atrial size was mildly dilated.  5. Moderate pericardial effusion. The pericardial effusion is circumferential.  6. The mitral valve is degenerative. Mild mitral valve regurgitation. No evidence of mitral stenosis.  7. Tricuspid valve regurgitation is moderate.  8. The aortic valve is tricuspid. Aortic valve regurgitation is trivial. Aortic valve sclerosis/calcification is present, without any evidence of aortic stenosis.  9. The inferior vena cava is normal in size with greater than 50% respiratory variability, suggesting right atrial pressure of 3 mmHg. 10. Recommend limited study with definity contrast for better visualization of regional wall motion as well as TV/MV inflow velocities with respirometer. FINDINGS  Left Ventricle: Left ventricular ejection fraction, by estimation, is 20 to 25%. The left ventricle has severely decreased function. Left ventricular endocardial border not optimally defined to evaluate regional wall motion. The left ventricular internal cavity size was normal in size. There is no left ventricular hypertrophy. Abnormal (paradoxical) septal motion, consistent with left bundle branch block. Left ventricular diastolic function could not be evaluated due to atrial fibrillation. Left  ventricular diastolic function could not be evaluated. Right Ventricle: The right ventricular size is normal. No increase in right ventricular wall thickness. Right ventricular systolic function is normal. There is moderately elevated  pulmonary artery systolic pressure. The tricuspid regurgitant velocity is 3.54 m/s, and with an assumed right atrial pressure of 3 mmHg, the estimated right ventricular systolic pressure is 53.1 mmHg. Left Atrium: Left atrial size was moderately dilated. Right Atrium: Right atrial size was mildly dilated. Pericardium: A moderately sized pericardial effusion is present. The pericardial effusion is circumferential. Mitral Valve: The mitral valve is degenerative in appearance. There is mild thickening of the mitral valve leaflet(s). There is mild calcification of the mitral valve leaflet(s). Mild to moderate mitral annular calcification. Mild mitral valve regurgitation. No evidence of mitral valve stenosis. Tricuspid Valve: The tricuspid valve is normal in structure. Tricuspid valve regurgitation is moderate . No evidence of tricuspid stenosis. Aortic Valve: The aortic valve is tricuspid. Aortic valve regurgitation is trivial. Aortic valve sclerosis/calcification is present, without any evidence of aortic stenosis. Pulmonic Valve: The pulmonic valve was normal in structure. Pulmonic valve regurgitation is trivial. No evidence of pulmonic stenosis. Aorta: The aortic root is normal in size and structure. Venous: The inferior vena  cava is normal in size with greater than 50% respiratory variability, suggesting right atrial pressure of 3 mmHg. IAS/Shunts: No atrial level shunt detected by color flow Doppler.  LEFT VENTRICLE PLAX 2D LVIDd:         4.80 cm LVIDs:         4.30 cm LV PW:         1.00 cm LV IVS:        1.00 cm LVOT diam:     1.80 cm LVOT Area:     2.54 cm  RIGHT VENTRICLE         IVC TAPSE (M-mode): 1.5 cm  IVC diam: 1.70 cm LEFT ATRIUM             Index        RIGHT ATRIUM           Index LA diam:        3.40 cm 2.19 cm/m   RA Area:     17.40 cm LA Vol (A2C):   64.4 ml 41.42 ml/m  RA Volume:   44.80 ml  28.82 ml/m LA Vol (A4C):   60.6 ml 38.98 ml/m LA Biplane Vol: 65.0 ml 41.81 ml/m   AORTA Ao Root  diam: 3.00 cm Ao Asc diam:  3.70 cm MR Peak grad: 110.7 mmHg  TRICUSPID VALVE MR Mean grad: 74.0 mmHg   TR Peak grad:   50.1 mmHg MR Vmax:      526.00 cm/s TR Mean grad:   33.0 mmHg MR Vmean:     409.0 cm/s  TR Vmax:        354.00 cm/s                           TR Vmean:       278.0 cm/s                            SHUNTS                           Systemic Diam: 1.80 cm Armanda Magic MD Electronically signed by Armanda Magic MD Signature Date/Time: 10/03/2022/12:06:07 PM    Final     Cardiac Studies   TTE 10/03/2022 IMPRESSIONS     1. Left ventricular ejection fraction, by estimation, is 20 to 25%. The  left ventricle has severely decreased function. Left ventricular  endocardial border not optimally defined to evaluate regional wall motion.  Left ventricular diastolic function could   not be evaluated.   2. Right ventricular systolic function is normal. The right ventricular  size is normal. There is moderately elevated pulmonary artery systolic  pressure. The estimated right ventricular systolic pressure is 53.1 mmHg.   3. Left atrial size was moderately dilated.   4. Right atrial size was mildly dilated.   5. Moderate pericardial effusion. The pericardial effusion is  circumferential.   6. The mitral valve is degenerative. Mild mitral valve regurgitation. No  evidence of mitral stenosis.   7. Tricuspid valve regurgitation is moderate.   8. The aortic valve is tricuspid. Aortic valve regurgitation is trivial.  Aortic valve sclerosis/calcification is present, without any evidence of  aortic stenosis.   9. The inferior vena cava is normal in size with greater than 50%  respiratory variability, suggesting right atrial pressure of 3 mmHg.  10. Recommend limited study with definity contrast for  better  visualization of regional wall motion as well as TV/MV inflow velocities  with respirometer.   FINDINGS   Left Ventricle: Left ventricular ejection fraction, by estimation, is 20  to 25%. The  left ventricle has severely decreased function. Left  ventricular endocardial border not optimally defined to evaluate regional  wall motion. The left ventricular  internal cavity size was normal in size. There is no left ventricular  hypertrophy. Abnormal (paradoxical) septal motion, consistent with left  bundle branch block. Left ventricular diastolic function could not be  evaluated due to atrial fibrillation. Left   ventricular diastolic function could not be evaluated.   Right Ventricle: The right ventricular size is normal. No increase in  right ventricular wall thickness. Right ventricular systolic function is  normal. There is moderately elevated pulmonary artery systolic pressure.  The tricuspid regurgitant velocity is  3.54 m/s, and with an assumed right atrial pressure of 3 mmHg, the  estimated right ventricular systolic pressure is 53.1 mmHg.   Left Atrium: Left atrial size was moderately dilated.   Right Atrium: Right atrial size was mildly dilated.   Pericardium: A moderately sized pericardial effusion is present. The  pericardial effusion is circumferential.   Mitral Valve: The mitral valve is degenerative in appearance. There is  mild thickening of the mitral valve leaflet(s). There is mild  calcification of the mitral valve leaflet(s). Mild to moderate mitral  annular calcification. Mild mitral valve  regurgitation. No evidence of mitral valve stenosis.   Tricuspid Valve: The tricuspid valve is normal in structure. Tricuspid  valve regurgitation is moderate . No evidence of tricuspid stenosis.   Aortic Valve: The aortic valve is tricuspid. Aortic valve regurgitation is  trivial. Aortic valve sclerosis/calcification is present, without any  evidence of aortic stenosis.   Pulmonic Valve: The pulmonic valve was normal in structure. Pulmonic valve  regurgitation is trivial. No evidence of pulmonic stenosis.   Aorta: The aortic root is normal in size and structure.    Venous: The inferior vena cava is normal in size with greater than 50%  respiratory variability, suggesting right atrial pressure of 3 mmHg.   IAS/Shunts: No atrial level shunt detected by color flow Doppler.    Patient Profile     89 y.o. female with a past medical history of CAD, HFpEF, paroxysmal atrial fibrillation, SVT s/p ablation, LBBB, HTN, HLD, Hyponatremia, COPD, Bronchiectasis, Macular Degeneration. Patient is currently admitted with acute on chronic HFpEF   Assessment & Plan    Acute exacerbation of heart failure  Paroxysmal atrial fibrillation History of CAD  Hypertension   Good diuresis more euvolemic lasix 60 mg bid EF 20-25% by TTE 10/04/22 20-25% Not a candidate for advance Rx; given age and DNR Start low dose entresto   Afib:  continue amiodarone and low dose eliquis give age   Will try to dc [facility] today will need TOC 2 weeks with BNP and BMET f/u Dr Anne Fu    For questions or updates, please contact [facility] Please consult [email or web address] for contact info under Cardiology/STEMI.      Signed, Charlton Haws, MD  10/05/2022, 8:17 AM

## 2022-10-05 NOTE — Plan of Care (Signed)
  Problem: Education: Goal: Knowledge of General Education information will improve Description: Including pain rating scale, medication(s)/side effects and non-pharmacologic comfort measures 10/05/2022 1006 by Herma Carson, RN Outcome: Adequate for Discharge 10/05/2022 1610 by Herma Carson, RN Outcome: Progressing   Problem: Health Behavior/Discharge Planning: Goal: Ability to manage health-related needs will improve 10/05/2022 1006 by Herma Carson, RN Outcome: Adequate for Discharge 10/05/2022 9604 by Herma Carson, RN Outcome: Progressing   Problem: Clinical Measurements: Goal: Ability to maintain clinical measurements within normal limits will improve 10/05/2022 1006 by Herma Carson, RN Outcome: Adequate for Discharge 10/05/2022 5409 by Herma Carson, RN Outcome: Progressing Goal: Will remain free from infection 10/05/2022 1006 by Herma Carson, RN Outcome: Adequate for Discharge 10/05/2022 8119 by Herma Carson, RN Outcome: Progressing Goal: Diagnostic test results will improve 10/05/2022 1006 by Herma Carson, RN Outcome: Adequate for Discharge 10/05/2022 1478 by Herma Carson, RN Outcome: Progressing Goal: Respiratory complications will improve 10/05/2022 1006 by Herma Carson, RN Outcome: Adequate for Discharge 10/05/2022 2956 by Herma Carson, RN Outcome: Progressing Goal: Cardiovascular complication will be avoided 10/05/2022 1006 by Herma Carson, RN Outcome: Adequate for Discharge 10/05/2022 2130 by Herma Carson, RN Outcome: Progressing   Problem: Activity: Goal: Risk for activity intolerance will decrease 10/05/2022 1006 by Herma Carson, RN Outcome: Adequate for Discharge 10/05/2022 8657 by Herma Carson, RN Outcome: Progressing   Problem: Nutrition: Goal: Adequate nutrition will be maintained 10/05/2022 1006 by Herma Carson, RN Outcome: Adequate for Discharge 10/05/2022 0842 by Herma Carson, RN Outcome: Progressing    Problem: Coping: Goal: Level of anxiety will decrease 10/05/2022 1006 by Herma Carson, RN Outcome: Adequate for Discharge 10/05/2022 8469 by Herma Carson, RN Outcome: Progressing   Problem: Elimination: Goal: Will not experience complications related to bowel motility 10/05/2022 1006 by Herma Carson, RN Outcome: Adequate for Discharge 10/05/2022 6295 by Herma Carson, RN Outcome: Progressing Goal: Will not experience complications related to urinary retention 10/05/2022 1006 by Herma Carson, RN Outcome: Adequate for Discharge 10/05/2022 2841 by Herma Carson, RN Outcome: Progressing   Problem: Pain Managment: Goal: General experience of comfort will improve 10/05/2022 1006 by Herma Carson, RN Outcome: Adequate for Discharge 10/05/2022 3244 by Herma Carson, RN Outcome: Progressing   Problem: Safety: Goal: Ability to remain free from injury will improve 10/05/2022 1006 by Herma Carson, RN Outcome: Adequate for Discharge 10/05/2022 0842 by Herma Carson, RN Outcome: Progressing   Problem: Skin Integrity: Goal: Risk for impaired skin integrity will decrease 10/05/2022 1006 by Herma Carson, RN Outcome: Adequate for Discharge 10/05/2022 0102 by Herma Carson, RN Outcome: Progressing

## 2022-10-05 NOTE — [place] FL2 (Signed)
[place] MEDICAID FL2 LEVEL OF CARE FORM     IDENTIFICATION  Patient Name: Alexandra Reyes Birthdate: Dec 03, 1925 Sex: female Admission Date (Current Location): 10/02/2022  [facility] [place] and [place] Number:  Producer, television/film/video and Address:  [facility] [place], [address], [place], [place] [address]      Provider Number: [id number]  Attending Physician Name and Address:  Thomasene Ripple, DO  Relative Name and Phone Number:  Seleta Rhymes [phone]    Current Level of Care: Hospital Recommended Level of Care: Assisted Living Facility Prior Approval Number:    Date Approved/Denied:   PASRR Number:    Discharge Plan: Other (Comment) (ALF)    Current Diagnoses: Patient Active Problem List   Diagnosis Date Noted   Acute on chronic heart failure with preserved ejection fraction (HFpEF) (HCC) 10/02/2022   (HFpEF) heart failure with preserved ejection fraction (HCC) 09/13/2022   Paroxysmal atrial fibrillation (HCC) 09/13/2022   Encounter for well adult exam with abnormal findings 07/16/2022   Vitamin D deficiency 07/15/2022   Encounter for examination for admission to nursing home 02/12/2022   Urinary frequency 07/15/2021   Diarrhea 03/28/2021   Aortic atherosclerosis (HCC) 01/02/2021   CAD (coronary artery disease) 11/14/2020   Closed fracture of second cervical vertebra (HCC) 11/14/2020   Biliary calculus 11/14/2020   Closed fracture of surgical neck of humerus 11/14/2020   Chronic obstructive lung disease (HCC) 11/14/2020   Osteoarthritis 11/14/2020   Gastritis without bleeding 11/14/2020   Nausea 11/14/2020   Closed nondisplaced fracture of second cervical vertebra with routine healing 10/30/2020   Unspecified nondisplaced fracture of first cervical vertebra, subsequent encounter for fracture with routine healing 10/30/2020   Closed fracture of second thoracic vertebra (HCC) 09/20/2020   Encounter for orthopedic follow-up care 09/18/2020   Neck pain  09/18/2020   Hyponatremia 09/13/2020   C1 cervical fracture (HCC) 09/04/2020   Closed fracture of first cervical vertebra (HCC) 09/04/2020   Closed displaced fracture of surgical neck of left humerus    Rectal prolapse 07/05/2020   Low back pain 07/05/2020   Spondylosis without myelopathy or radiculopathy, lumbar region 11/23/2019   Lumbar spondylosis 11/23/2019   Lumbosacral spondylosis without myelopathy 08/20/2018   Costochondritis 08/10/2018   Costal chondritis 08/10/2018   Wrist pain 06/20/2018   Pain in wrist 06/20/2018   Abnormal urine color 06/17/2018   Hyperglycemia 12/15/2017   Osteoarthritis of left hip 11/28/2017   Femoral hernia of right side with obstruction 06/23/2017   CKD (chronic kidney disease), stage III (HCC) 03/12/2017   Loss of weight 11/27/2016   Left-sided nosebleed 09/11/2016   ILD (interstitial lung disease) (HCC) 05/22/2016   Interstitial lung disease (HCC) 05/22/2016   Constipation 03/13/2016   Bronchiectasis without acute exacerbation (HCC) 03/13/2016   Bronchiectasis (HCC) 03/13/2016   Intercostal neuralgia 08/30/2015   Dyspnea 08/02/2015   Pain in right foot 02/01/2015   Diastolic dysfunction 02/01/2015   Peripheral edema 02/01/2015   Abnormal EKG 05/18/2014   Syncope 05/18/2014   Electrocardiogram abnormal 05/18/2014   Dyslipidemia 05/14/2013   DOE (dyspnea on exertion) 04/28/2013   Dyspnea on exertion 04/28/2013   Personal history of colonic polyps 10/28/2012   Osteopenia 04/29/2012   Elevated digoxin level 04/29/2012   Allergic rhinitis, cause unspecified 05/22/2011   Alcohol dependence in remission (HCC) 05/22/2011   Depression 05/22/2011   Chronic alcoholism in remission (HCC) 05/22/2011   Allergic rhinitis 05/22/2011   Depressive disorder 05/22/2011   Preventative health care 05/18/2011  Hyperlipidemia 05/18/2011   Anemia, iron deficiency 05/18/2011   Gastroesophageal reflux disease 05/18/2011   DDD (degenerative disc disease),  lumbar 05/18/2011   Pectus excavatum 05/18/2011   Iron deficiency anemia 05/18/2011   Degeneration of lumbar intervertebral disc 05/18/2011   Essential hypertension 05/18/2011   Macular degeneration    OA (osteoarthritis)    Cholelithiasis    COPD (chronic obstructive pulmonary disease) (HCC)    Paroxysmal SVT (supraventricular tachycardia) (HCC) 02/07/2011    Orientation RESPIRATION BLADDER Height & Weight     Self, Time, Situation, Place  Normal Continent Weight: 115 lb 11.9 oz (52.5 kg) Height:  5\' 3"  (160 cm)  BEHAVIORAL SYMPTOMS/MOOD NEUROLOGICAL BOWEL NUTRITION STATUS      Continent  (heart healthy diet)  AMBULATORY STATUS COMMUNICATION OF NEEDS Skin   Extensive Assist Verbally Normal                       Personal Care Assistance Level of Assistance  Bathing, Feeding, Dressing Bathing Assistance: Limited assistance Feeding assistance: Independent Dressing Assistance: Limited assistance     Functional Limitations Info  Sight, Hearing, Speech Sight Info: Impaired Hearing Info: Impaired Speech Info: Adequate    SPECIAL CARE FACTORS FREQUENCY                       Contractures Contractures Info: Not present    Additional Factors Info  Code Status, Allergies, Psychotropic, Insulin Sliding Scale Code Status Info: DNR Allergies Info: Citalopram  Citalopram Hydrobromide  Oxycodone Psychotropic Info: buPROPion (WELLBUTRIN XL) 24 hr tablet 150 mg daily             Discharge Medications: TAKE these medications     acetaminophen 500 MG tablet Commonly known as: TYLENOL Take 1,000 mg by mouth 2 (two) times daily as needed for moderate pain or headache.    albuterol 108 (90 Base) MCG/ACT inhaler Commonly known as: VENTOLIN HFA Inhale 2 puffs into the lungs every 6 (six) hours as needed for wheezing or shortness of breath.    ALPRAZolam 0.25 MG tablet Commonly known as: XANAX Take 1 tablet (0.25 mg total) by mouth 2 (two) times daily as needed  for anxiety.    amiodarone 200 MG tablet Commonly known as: PACERONE Take 1 tablet (200 mg total) by mouth daily. Start taking on: October 06, 2022    apixaban 2.5 MG Tabs tablet Commonly known as: ELIQUIS Take 1 tablet (2.5 mg total) by mouth 2 (two) times daily.    buPROPion 150 MG 24 hr tablet Commonly known as: Wellbutrin XL Take 1 tablet (150 mg total) by mouth daily.    calcium carbonate 600 MG tablet Commonly known as: OS-CAL Take 600 mg by mouth daily.    cetirizine 10 MG tablet Commonly known as: ZYRTEC Take 10 mg by mouth daily.    furosemide 40 MG tablet Commonly known as: LASIX Take 1 tablet (40 mg total) by mouth 2 (two) times daily. What changed: when to take this    GLUCOSAMINE-MSM PO Take 2 tablets by mouth daily.    nitroGLYCERIN 0.4 MG SL tablet Commonly known as: NITROSTAT Place 1 tablet (0.4 mg total) under the tongue every 5 (five) minutes as needed for chest pain. What changed: reasons to take this    omeprazole 40 MG capsule Commonly known as: PRILOSEC TAKE ONE CAPSULE BY MOUTH DAILY    potassium chloride 10 MEQ tablet Commonly known as: KLOR-CON M Take 1 tablet (10 mEq total)  by mouth 2 (two) times daily.    sacubitril-valsartan 24-26 MG Commonly known as: ENTRESTO Take 1 tablet by mouth 2 (two) times daily.    simvastatin 10 MG tablet Commonly known as: ZOCOR Take 1 tablet (10 mg total) by mouth daily.    traMADol 50 MG tablet Commonly known as: ULTRAM Take 1 tablet (50 mg total) by mouth 2 (two) times daily as needed.    Trelegy Ellipta 100-62.5-25 MCG/ACT Aepb Generic drug: Fluticasone-Umeclidin-Vilant Inhale 1 puff into the lungs daily.    VITAMIN B COMPLEX PO Take 1 tablet by mouth daily.    VITAMIN C PO Take 500 mg by mouth daily. Reported on 08/08/2015   Relevant Imaging Results:  Relevant Lab Results:   Additional Information SSN [id number], from [facility]  [address] [place], [place]

## 2022-10-05 NOTE — Plan of Care (Signed)

## 2022-10-05 NOTE — TOC Progression Note (Signed)
Transition of Care [facility]) - Progression Note    Patient Details  Name: Alexandra Reyes MRN: [id number] Date of Birth: Oct 15, 1925  Transition of Care [facility]) CM/SW Contact  Patrice Paradise, LCSW Phone Number: 10/05/2022, 12:44 PM  Clinical Narrative:     CSW attempted to call facility back however still unable to reach anyone and can't leave a VM.  TOC team will continue to assist with discharge planning needs.         Expected Discharge Plan and Services         Expected Discharge Date: 10/05/22                                     Social Determinants of Health (SDOH) Interventions SDOH Screenings   Food Insecurity: No Food Insecurity (10/02/2022)  Housing: Low Risk  (10/02/2022)  Transportation Needs: No Transportation Needs (10/02/2022)  Utilities: Not At Risk (10/02/2022)  Alcohol Screen: Low Risk  (12/31/2019)  Depression (PHQ2-9): Low Risk  (07/15/2022)  Financial Resource Strain: Low Risk  (12/31/2019)  Physical Activity: Inactive (12/29/2017)  Social Connections: Moderately Integrated (12/29/2017)  Stress: No Stress Concern Present (12/31/2019)  Tobacco Use: Medium Risk (10/02/2022)    Readmission Risk Interventions   No data to display

## 2022-10-08 ENCOUNTER — Ambulatory Visit (HOSPITAL_COMMUNITY): Payer: Medicare Other

## 2022-10-08 DIAGNOSIS — E559 Vitamin D deficiency, unspecified: Secondary | ICD-10-CM | POA: Diagnosis not present

## 2022-10-08 DIAGNOSIS — Z79899 Other long term (current) drug therapy: Secondary | ICD-10-CM | POA: Diagnosis not present

## 2022-10-14 ENCOUNTER — Encounter: Payer: Self-pay | Admitting: Physician Assistant

## 2022-10-14 ENCOUNTER — Ambulatory Visit: Payer: Medicare Other | Attending: Physician Assistant | Admitting: Physician Assistant

## 2022-10-14 VITALS — BP 100/50 | HR 70 | Ht 63.0 in | Wt 113.6 lb

## 2022-10-14 DIAGNOSIS — N1831 Chronic kidney disease, stage 3a: Secondary | ICD-10-CM

## 2022-10-14 DIAGNOSIS — I2510 Atherosclerotic heart disease of native coronary artery without angina pectoris: Secondary | ICD-10-CM

## 2022-10-14 DIAGNOSIS — I480 Paroxysmal atrial fibrillation: Secondary | ICD-10-CM | POA: Diagnosis not present

## 2022-10-14 DIAGNOSIS — I5032 Chronic diastolic (congestive) heart failure: Secondary | ICD-10-CM | POA: Diagnosis not present

## 2022-10-14 DIAGNOSIS — I5020 Unspecified systolic (congestive) heart failure: Secondary | ICD-10-CM | POA: Diagnosis not present

## 2022-10-14 MED ORDER — POTASSIUM CHLORIDE CRYS ER 10 MEQ PO TBCR: 10.0000 meq | EXTENDED_RELEASE_TABLET | Freq: Every day | ORAL | 3 refills | Status: DC

## 2022-10-14 MED ORDER — FUROSEMIDE 40 MG PO TABS: 60.0000 mg | ORAL_TABLET | Freq: Every day | ORAL | 3 refills | Status: DC

## 2022-10-14 MED ORDER — POTASSIUM CHLORIDE ER 10 MEQ PO TBCR: 10.0000 meq | EXTENDED_RELEASE_TABLET | Freq: Every day | ORAL | 3 refills | Status: DC

## 2022-10-14 NOTE — Progress Notes (Addendum)
Cardiology Office Note:    Date:  10/14/2022  ID:  Alexandra Reyes, DOB 10-23-1925, MRN [id number] PCP: Corwin Levins, MD  [facility] Providers Cardiologist:  Donato Schultz, MD Electrophysiologist:  Hillis Range, MD (Inactive)          Patient Profile:   Coronary artery disease Myoview 07/12/15: EF 80, inf-sept scar, no ischemia  CTO of RCA  LHC 07/25/16: pRCA 100, L-R collats, pLAD 20 (HFrEF) heart failure with reduced ejection fraction   TTE 01/03/21: EF 55-60, no RWMA, Gr 1 DD, NL RVSF, NL PASP, AV sclerosis, borderline asc aorta (39 mm) TTE 10/03/2022: EF 20-25, normal RVSF, moderately elevated PASP, RVSP 53.1, moderate LAE, mild RAE, moderate pericardial effusion, mild MR, moderate TR, trivial AI, AV sclerosis, RAP 3 Limited echo 10/03/2022: Small circumferential pericardial effusion, no tamponade  Paroxysmal atrial fibrillation  Supraventricular Tachycardia (AVNRT) S/p RF ablation in 2018 Left Bundle Branch Block  Hypertension  Hyperlipidemia Hyponatremia  Chronic Obstructive Pulmonary Disease  Bronchiectasis  Macular degen Assisted Living: [facility]        History of Present Illness:   Alexandra Reyes is a 89 y.o. female who returns for posthospitalization follow-up.  She was recently admitted 4/24-4/27 with decompensated heart failure.  She has a history of HFpEF with preserved ejection fraction noted on echocardiogram in July 2022.  She was admitted from the office with volume overload.  She had not responded to escalating diuretics at home.  Echocardiogram in the hospital demonstrated newly reduced ejection fraction at 20-25%.  She was diuresed to 52.5 kg (-4 L).  She was started on Entresto.  Her oral diuretics were adjusted.  She was noted to be in atrial fibrillation.  Diltiazem was stopped due to reduced EF.  She was placed on amiodarone.  Reduced EF was felt to be related to tachycardia from atrial fibrillation.  She is here today with her friend.  She lives at  [facility] assisted living.  Since discharge from the hospital, she is feeling better.  Her leg edema is much improved.  She is no longer having any weeping.  She does get dizzy from time to time.  She has not had syncope.  She continues to need to sleep on 2 pillows.  She has not had chest pain.  Review of Systems  Gastrointestinal:  Negative for hematochezia and melena.  Genitourinary:  Negative for hematuria.   see HPI    Studies Reviewed:    EKG: NSR, HR 75, LBBB, QTc 480   Risk Assessment/Calculations:    CHA2DS2-VASc Score = 6   This indicates a 9.7% annual risk of stroke. The patient's score is based upon: CHF History: 1 HTN History: 1 Diabetes History: 0 Stroke History: 0 Vascular Disease History: 1 Age Score: 2 Gender Score: 1            Physical Exam:   VS:  BP (!) 100/50   Pulse 70   Ht 5\' 3"  (1.6 m)   Wt 113 lb 9.6 oz (51.5 kg)   LMP  (LMP Unknown)   SpO2 90%   BMI 20.12 kg/m    Wt Readings from Last 3 Encounters:  10/14/22 113 lb 9.6 oz (51.5 kg)  10/05/22 115 lb 11.9 oz (52.5 kg)  10/02/22 124 lb 12.8 oz (56.6 kg)    Constitutional:      Appearance: Healthy appearance. Not in distress.  Neck:     Vascular: No JVR.  Pulmonary:     Breath sounds: No  wheezing. No rales.  Cardiovascular:     Normal rate. Regular rhythm.     Murmurs: There is no murmur.  Edema:    Peripheral edema present.    Ankle: bilateral trace edema of the ankle. Abdominal:     Palpations: Abdomen is soft.  Skin:    General: Skin is warm and dry.       ASSESSMENT AND PLAN:   HFrEF (heart failure with reduced ejection fraction) (HCC) She has a history of HFpEF.  She was recently admitted with decompensated heart failure.  Echocardiogram demonstrated newly depressed LV function with EF 20-25.  It was considered in the hospital that her EF may have been reduced secondary to tachycardia induced cardiomyopathy.  She is back in sinus rhythm today.  Her blood pressure is limiting  GDMT.  I do not think she could tolerate the addition of beta-blocker, MRA or SGLT2 inhibitor at this time.  BP is running fairly low now. Continue Entresto 24/26 mg twice daily.  Obtain BMET today.  Reduce Lasix to 60 mg daily. Hopefully, her BP will increase with this.  Reduce potassium to 10 mEq daily.  Follow-up in 3 weeks.  Consider titrating GDMT further, if possible at next visit.  Once she is maximized on GDMT, arrange follow-up limited echo to reassess LV function.  CAD (coronary artery disease) History of cardiac catheterization in 2018 with chronically occluded RCA.  She is not having chest symptoms to suggest angina.  She is not likely a candidate for invasive cardiac evaluation.  If follow-up echocardiogram continues to demonstrate reduced EF, we can consider ischemic evaluation.  She is not on antiplatelet therapy as she is on chronic anticoagulation with Eliquis.  Continue simvastatin 10 mg daily.  Paroxysmal atrial fibrillation (HCC) She is back in sinus rhythm today.  Continue amiodarone 200 mg daily, Eliquis 2.5 mg twice daily (age, weight).  ALT normal at 25 in April 2024.  TSH was normal at 1.52 in January 2024.  CKD (chronic kidney disease), stage III (HCC) Repeat BMET today.      Dispo:  Return in about 3 weeks (around 11/04/2022) for Close Follow Up with Dr. Anne Fu, or Tereso Newcomer, PA-C.  Signed, Tereso Newcomer, PA-C

## 2022-10-14 NOTE — Patient Instructions (Addendum)
Medication Instructions:  Your physician has recommended you make the following change in your medication:   REDUCE the Lasix to 40 mg taking 1 1/2 tablet in the a.m  REDUCE the Potassium to 10 meq taking 1 daily  *If you need a refill on your cardiac medications before your next appointment, please call your pharmacy*   Lab Work: TODAY:  BMET  If you have labs (blood work) drawn today and your tests are completely normal, you will receive your results only by: MyChart Message (if you have MyChart) OR A paper copy in the mail If you have any lab test that is abnormal or we need to change your treatment, we will call you to review the results.   Testing/Procedures: None ordered   Follow-Up: At [facility], you and your health needs are our priority.  As part of our continuing mission to provide you with exceptional heart care, we have created designated Provider Care Teams.  These Care Teams include your primary Cardiologist (physician) and Advanced Practice Providers (APPs -  Physician Assistants and Nurse Practitioners) who all work together to provide you with the care you need, when you need it.  We recommend signing up for the patient portal called "MyChart".  Sign up information is provided on this After Visit Summary.  MyChart is used to connect with patients for Virtual Visits (Telemedicine).  Patients are able to view lab/test results, encounter notes, upcoming appointments, etc.  Non-urgent messages can be sent to your provider as well.   To learn more about what you can do with MyChart, go to [email or web address].    Your next appointment:   3 week(s)  Provider:   Donato Schultz, MD  or Tereso Newcomer, PA-C         Other Instructions Make sure you are weighted 1 time a day.  If you gain 3 lbs in 24 hours or 5 lbs in a week, please let us know.

## 2022-10-14 NOTE — Assessment & Plan Note (Addendum)
She has a history of HFpEF.  She was recently admitted with decompensated heart failure.  Echocardiogram demonstrated newly depressed LV function with EF 20-25.  It was considered in the hospital that her EF may have been reduced secondary to tachycardia induced cardiomyopathy.  She is back in sinus rhythm today.  Her blood pressure is limiting GDMT.  I do not think she could tolerate the addition of beta-blocker, MRA or SGLT2 inhibitor at this time.  BP is running fairly low now. Continue Entresto 24/26 mg twice daily.  Obtain BMET today.  Reduce Lasix to 60 mg daily. Hopefully, her BP will increase with this.  Reduce potassium to 10 mEq daily.  Follow-up in 3 weeks.  Consider titrating GDMT further, if possible at next visit.  Once she is maximized on GDMT, arrange follow-up limited echo to reassess LV function.

## 2022-10-14 NOTE — Assessment & Plan Note (Signed)
History of cardiac catheterization in 2018 with chronically occluded RCA.  She is not having chest symptoms to suggest angina.  She is not likely a candidate for invasive cardiac evaluation.  If follow-up echocardiogram continues to demonstrate reduced EF, we can consider ischemic evaluation.  She is not on antiplatelet therapy as she is on chronic anticoagulation with Eliquis.  Continue simvastatin 10 mg daily.

## 2022-10-14 NOTE — Assessment & Plan Note (Signed)
She is back in sinus rhythm today.  Continue amiodarone 200 mg daily, Eliquis 2.5 mg twice daily (age, weight).  ALT normal at 25 in April 2024.  TSH was normal at 1.52 in January 2024.

## 2022-10-14 NOTE — Assessment & Plan Note (Signed)
Repeat BMET today.  

## 2022-10-15 LAB — BASIC METABOLIC PANEL
BUN/Creatinine Ratio: 19 (ref 12–28)
BUN: 23 mg/dL (ref 10–36)
CO2: 20 mmol/L (ref 20–29)
Calcium: 9.3 mg/dL (ref 8.7–10.3)
Chloride: 91 mmol/L — ABNORMAL LOW (ref 96–106)
Creatinine, Ser: 1.24 mg/dL — ABNORMAL HIGH (ref 0.57–1.00)
Glucose: 102 mg/dL — ABNORMAL HIGH (ref 70–99)
Potassium: 4.8 mmol/L (ref 3.5–5.2)
Sodium: 128 mmol/L — ABNORMAL LOW (ref 134–144)
eGFR: 40 mL/min/{1.73_m2} — ABNORMAL LOW (ref >59)

## 2022-10-16 ENCOUNTER — Telehealth: Payer: Self-pay | Admitting: *Deleted

## 2022-10-16 DIAGNOSIS — I5032 Chronic diastolic (congestive) heart failure: Secondary | ICD-10-CM

## 2022-10-16 MED ORDER — FUROSEMIDE 40 MG PO TABS: 40.0000 mg | ORAL_TABLET | Freq: Every day | ORAL | 3 refills | Status: DC

## 2022-10-16 NOTE — Telephone Encounter (Signed)
-----   Message from Alexandra Reyes, [place] sent at 10/15/2022  4:42 PM EDT ----- Results sent to [name] via MyChart. See MyChart comments below. PLAN:  -Reduce Lasix to 40 mg once daily -repeat BMET 1 week  Ms. Ebron  Your creatinine (kidney function) is increased. Your potassium is normal. Your sodium is low but stable. I would like you to decrease your Furosemide (Lasix) to 40 mg once daily. I will arrange a f/u lab again in 1 week.  Tereso Newcomer, PA-C

## 2022-10-17 ENCOUNTER — Ambulatory Visit (INDEPENDENT_AMBULATORY_CARE_PROVIDER_SITE_OTHER): Payer: Medicare Other | Admitting: Pulmonary Disease

## 2022-10-17 ENCOUNTER — Encounter: Payer: Self-pay | Admitting: Pulmonary Disease

## 2022-10-17 ENCOUNTER — Telehealth: Payer: Self-pay | Admitting: Internal Medicine

## 2022-10-17 ENCOUNTER — Other Ambulatory Visit: Payer: Self-pay | Admitting: Internal Medicine

## 2022-10-17 VITALS — BP 120/66 | HR 106 | Ht 63.0 in | Wt 114.6 lb

## 2022-10-17 DIAGNOSIS — I480 Paroxysmal atrial fibrillation: Secondary | ICD-10-CM | POA: Diagnosis not present

## 2022-10-17 DIAGNOSIS — J479 Bronchiectasis, uncomplicated: Secondary | ICD-10-CM | POA: Diagnosis not present

## 2022-10-17 DIAGNOSIS — I5189 Other ill-defined heart diseases: Secondary | ICD-10-CM

## 2022-10-17 MED ORDER — ALPRAZOLAM 0.25 MG PO TABS: 0.2500 mg | ORAL_TABLET | Freq: Two times a day (BID) | ORAL | 5 refills | Status: DC | PRN

## 2022-10-17 NOTE — Telephone Encounter (Signed)
Prescription Request  10/17/2022  LOV: 07/15/2022  What is the name of the medication or equipment?   ALPRAZolam (XANAX) 0.25 MG tablet  Have you contacted your pharmacy to request a refill? No   Which pharmacy would you like this sent to?  [facility] [id number] [place], [place] - [address] [address] Phone: [phone]  Fax: [phone]     Patient notified that their request is being sent to the clinical staff for review and that they should receive a response within 2 business days.   Please advise at Mobile [phone] (mobile)

## 2022-10-17 NOTE — Telephone Encounter (Signed)
Done erx 

## 2022-10-17 NOTE — Patient Instructions (Signed)
Thank you for visiting Dr. Tonia Brooms at [facility]. Today we recommend the following:  Stay on trelegy and albuterol as needed.   Return in about 6 months (around 04/19/2023) for with APP. Rubye Oaks, NP     Please do your part to reduce the spread of COVID-19.

## 2022-10-17 NOTE — Telephone Encounter (Signed)
Patients daughter called back and script needs to be sent to [facility] in [place].

## 2022-10-17 NOTE — Progress Notes (Signed)
Synopsis: Referred in February 2021 for bronchiectasis by Corwin Levins, MD  Subjective:   PATIENT ID: Alexandra Reyes GENDER: female DOB: 05-17-1926, MRN: [id number]  Chief Complaint  Patient presents with   Follow-up    F/up    This is a 89 year old female history of hypertension reflux COPD.  Followed in the pulmonary clinic for bronchiectasis last seen in the clinic November 2020 by [name], NP.  Patient is a former patient of Dr. Kendrick Fries.  Has had some chest pains treated with nitroglycerin history of coronary disease.  Currently compliant with use of flutter valve Symbicort plus Mucinex.  Latest pulmonary function test completed in March 2020 prior CT imaging of the chest in 2017, also CT imaging July 2020 bilateral lower lobe bronchiectasis..  Rheumatologic labs in 2017 were negative.  Doing okay with full airway clearance devices.  Breathing somewhat stable.  Occupation: Patient is from [place].  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  She came to [facility] in the late 1940s early 1950s as disaster relief from the [employer].  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  And routinely managed patients on iron lung.  She stated "the biggest problem where the doctors, they would come in on Sunday after church to visit and not wash their hands."  OV 08/04/2019: She has been doing ok with her breathing. She has been having pains in her chest but this is better now on gabapentin. She uses her flutter valve at least twice per days.  She has been using this and does feel like she brings up some sputum.  He does not always feel like she is able to clear her airways.  She has lived by herself for the past 25 years after the death of her husband from lung cancer.    OV 08/23/2020: Here today for follow-up.  Overall breathing well.  She still complains of intermittent chest pains around the inferior portion of the thoracic cage.   She sees PM&R as well as primary care for this.  Pain is controlled with Tylenol and gabapentin.  She does feel sleepy during the day.  She does not usually get in the bed till after 11 PM.  She is not limited at this time by any respiratory issues.  Uses her flutter valve and as needed Mucinex.  OV 04/30/2021: Here today for follow-up.  She is breathing okay.  Still has occasional chest pains.  Sounds like she did have a pretty eventful year after falling in March.  We saw her in the middle of March and unfortunately few weeks later she felt.  She had to be had a cervical neck brace as well as a shoulder surgery later.  Overall from respiratory standpoint she is doing okay.  Currently managing with her current inhaler regimen.  OV 10/17/2022: Here today for follow up. Overall doing well.  Recently hospitalized for heart failure.  She is diuresed.  Managed currently by cardiology clinic.  Respiratory standpoint she is doing well.  No complaints today.  Feels like she is breathing better.  Using her inhalers.    Past Medical History:  Diagnosis Date   Age-related macular degeneration, wet, both eyes (HCC)    Alcohol dependence in remission (HCC) 05/22/2011   Allergic rhinitis, cause unspecified 05/22/2011   Allergy    Anemia, iron deficiency 05/18/2011   Cholelithiasis    COPD (chronic obstructive pulmonary disease) (HCC)  DDD (degenerative disc disease), lumbar 05/18/2011   Depression    "@ times" (09/19/2016)   First degree AV block    GERD (gastroesophageal reflux disease)    Hiatal hernia    History of blood transfusion    "3 or 4 related to childbirth"   HTN (hypertension) 05/18/2011   pt denies this hx on 09/19/2016   Hyperlipidemia 05/18/2011   Macular degeneration    Myocardial infarction (HCC)    "EKG shows I've had 2; date unknown" (09/19/2016)   OA (osteoarthritis)    "a little here and there; mainly in the back" (09/19/2016)   Osteoporosis 05/18/2011   PAT (paroxysmal atrial  tachycardia)    Pectus excavatum 05/18/2011   Personal history of colonic polyps 10/28/2012   Pneumonia    "as a child"   SVT (supraventricular tachycardia)    Urinary frequency      Family History  Problem Relation Age of Onset   Heart failure Mother    Arthritis Mother    Kidney failure Father    Heart disease Father    Hypertension Father      Past Surgical History:  Procedure Laterality Date   ABDOMINAL HYSTERECTOMY  1988   BREAST BIOPSY Right 1948   "it was ok"   CARDIAC CATHETERIZATION     CARDIOVASCULAR STRESS TEST  03/08/2009   EF 84%   CATARACT EXTRACTION W/ INTRAOCULAR LENS  IMPLANT, BILATERAL Bilateral    INGUINAL HERNIA REPAIR Right 06/23/2017   Procedure: REPAIR INCARCERATED  RIGHT FEMORAL HERNIA WITH MESH;  Surgeon: Harriette Bouillon, MD;  Location: [facility] OR;  Service: General;  Laterality: Right;   KNEE ARTHROSCOPY Right    REVERSE SHOULDER ARTHROPLASTY Left 09/05/2020   Procedure: REVERSE SHOULDER ARTHROPLASTY;  Surgeon: Yolonda Kida, MD;  Location: [facility] OR;  Service: Orthopedics;  Laterality: Left;   RIGHT/LEFT HEART CATH AND CORONARY ANGIOGRAPHY N/A 07/25/2016   Procedure: Right/Left Heart Cath and Coronary Angiography;  Surgeon: Kathleene Hazel, MD;  Location: [facility] INVASIVE CV LAB;  Service: Cardiovascular;  Laterality: N/A;   SVT ABLATION  09/19/2016   SVT ABLATION N/A 09/19/2016   Procedure: SVT Ablation;  Surgeon: Hillis Range, MD;  Location: [facility] INVASIVE CV LAB;  Service: Cardiovascular;  Laterality: N/A;   TONSILLECTOMY AND ADENOIDECTOMY     US ECHOCARDIOGRAPHY  01/17/2003   EF 55-60%    Social History   Socioeconomic History   Marital status: Widowed    Spouse name: Not on file   Number of children: 2   Years of education: 13   Highest education level: Not on file  Occupational History   Occupation: Retired Designer, jewellery  Tobacco Use   Smoking status: Former    Packs/day: 1.50    Years: 36.00    Additional pack years: 0.00    Total  pack years: 54.00    Types: Cigarettes    Start date: 03/01/1959    Quit date: 06/10/1994    Years since quitting: 28.3   Smokeless tobacco: Never  Vaping Use   Vaping Use: Never used  Substance and Sexual Activity   Alcohol use: No   Drug use: No   Sexual activity: Never  Other Topics Concern   Not on file  Social History Narrative   Originally from [place], [place]. She moved to [place] in 1950 during the polio epidemic. 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 Remote bird exposure. No mold or hot tub exposure.    Social Determinants of Health  Financial Resource Strain: Low Risk  (12/31/2019)   Overall Financial Resource Strain (CARDIA)    Difficulty of Paying Living Expenses: Not hard at all  Food Insecurity: No Food Insecurity (10/02/2022)   Hunger Vital Sign    Worried About Running Out of Food in the Last Year: Never true    Ran Out of Food in the Last Year: Never true  Transportation Needs: No Transportation Needs (10/02/2022)   PRAPARE - Administrator, Civil Service (Medical): No    Lack of Transportation (Non-Medical): No  Physical Activity: Inactive (12/29/2017)   Exercise Vital Sign    Days of Exercise per Week: 0 days    Minutes of Exercise per Session: 0 min  Stress: No Stress Concern Present (12/31/2019)   [employer] of Occupational Health - Occupational Stress Questionnaire    Feeling of Stress : Not at all  Social Connections: Moderately Integrated (12/29/2017)   Social Connection and Isolation Panel [NHANES]    Frequency of Communication with Friends and Family: More than three times a week    Frequency of Social Gatherings with Friends and Family: More than three times a week    Attends Religious Services: More than 4 times per year    Active Member of [employer] or Organizations: Yes    Attends Banker Meetings: More than 4 times per year    Marital Status: Widowed  Intimate Partner Violence: Not At Risk (10/02/2022)   Humiliation,  Afraid, Rape, and Kick questionnaire    Fear of Current or Ex-Partner: No    Emotionally Abused: No    Physically Abused: No    Sexually Abused: No     Allergies  Allergen Reactions   Citalopram    Citalopram Hydrobromide Other (See Comments)   Oxycodone Other (See Comments)    hallucinations     Outpatient Medications Prior to Visit  Medication Sig Dispense Refill   acetaminophen (TYLENOL) 500 MG tablet Take 1,000 mg by mouth 2 (two) times daily as needed for moderate pain or headache.     albuterol (VENTOLIN HFA) 108 (90 Base) MCG/ACT inhaler Inhale 2 puffs into the lungs every 6 (six) hours as needed for wheezing or shortness of breath. 8 g 6   ALPRAZolam (XANAX) 0.25 MG tablet Take 1 tablet (0.25 mg total) by mouth 2 (two) times daily as needed for anxiety. 60 tablet 5   amiodarone (PACERONE) 200 MG tablet Take 1 tablet (200 mg total) by mouth daily.     apixaban (ELIQUIS) 2.5 MG TABS tablet Take 1 tablet (2.5 mg total) by mouth 2 (two) times daily. 60 tablet 6   Ascorbic Acid (VITAMIN C PO) Take 500 mg by mouth daily. Reported on 08/08/2015     B Complex Vitamins (VITAMIN B COMPLEX PO) Take 1 tablet by mouth daily.     buPROPion (WELLBUTRIN XL) 150 MG 24 hr tablet Take 1 tablet (150 mg total) by mouth daily. 90 tablet 3   calcium carbonate (OS-CAL) 600 MG tablet Take 600 mg by mouth daily.     cetirizine (ZYRTEC) 10 MG tablet Take 10 mg by mouth daily.     Fluticasone-Umeclidin-Vilant (TRELEGY ELLIPTA) 100-62.5-25 MCG/ACT AEPB Inhale 1 puff into the lungs daily. 1 each 5   furosemide (LASIX) 40 MG tablet Take 1 tablet (40 mg total) by mouth daily. 90 tablet 3   Glucosamine HCl-MSM (GLUCOSAMINE-MSM PO) Take 2 tablets by mouth daily.      nitroGLYCERIN (NITROSTAT) 0.4 MG SL tablet Place  1 tablet (0.4 mg total) under the tongue every 5 (five) minutes as needed for chest pain. 25 tablet 3   omeprazole (PRILOSEC) 40 MG capsule TAKE ONE CAPSULE BY MOUTH DAILY 90 capsule 1   potassium  chloride (KLOR-CON M) 10 MEQ tablet Take 1 tablet (10 mEq total) by mouth daily. 90 tablet 3   sacubitril-valsartan (ENTRESTO) 24-26 MG Take 1 tablet by mouth 2 (two) times daily. 60 tablet    simvastatin (ZOCOR) 10 MG tablet Take 1 tablet (10 mg total) by mouth daily. 90 tablet 3   traMADol (ULTRAM) 50 MG tablet Take 1 tablet (50 mg total) by mouth 2 (two) times daily as needed. 60 tablet 2   No facility-administered medications prior to visit.    Review of Systems  Constitutional:  Negative for chills, fever, malaise/fatigue and weight loss.  HENT:  Negative for hearing loss, sore throat and tinnitus.   Eyes:  Negative for blurred vision and double vision.  Respiratory:  Negative for cough, hemoptysis, sputum production, shortness of breath, wheezing and stridor.   Cardiovascular:  Negative for chest pain, palpitations, orthopnea, leg swelling and PND.  Gastrointestinal:  Negative for abdominal pain, constipation, diarrhea, heartburn, nausea and vomiting.  Genitourinary:  Negative for dysuria, hematuria and urgency.  Musculoskeletal:  Positive for joint pain. Negative for myalgias.  Skin:  Negative for itching and rash.  Neurological:  Negative for dizziness, tingling, weakness and headaches.  Endo/Heme/Allergies:  Negative for environmental allergies. Does not bruise/bleed easily.  Psychiatric/Behavioral:  Negative for depression. The patient is not nervous/anxious and does not have insomnia.   All other systems reviewed and are negative.    Objective:  Physical Exam Vitals reviewed.  Constitutional:      General: She is not in acute distress.    Appearance: She is well-developed.     Comments: Elderly, frail lady.  HENT:     Head: Normocephalic and atraumatic.  Eyes:     General: No scleral icterus.    Conjunctiva/sclera: Conjunctivae normal.     Pupils: Pupils are equal, round, and reactive to light.  Neck:     Vascular: No JVD.     Trachea: No tracheal deviation.   Cardiovascular:     Rate and Rhythm: Normal rate and regular rhythm.     Heart sounds: Normal heart sounds. No murmur heard. Pulmonary:     Effort: Pulmonary effort is normal. No tachypnea, accessory muscle usage or respiratory distress.     Breath sounds: No stridor. Rhonchi present. No wheezing or rales.     Comments: Few scattered rhonchi Abdominal:     General: There is no distension.     Palpations: Abdomen is soft.     Tenderness: There is no abdominal tenderness.  Musculoskeletal:        General: No tenderness.     Cervical back: Neck supple.  Lymphadenopathy:     Cervical: No cervical adenopathy.  Skin:    General: Skin is warm and dry.     Capillary Refill: Capillary refill takes less than 2 seconds.     Findings: No rash.  Neurological:     Mental Status: She is alert and oriented to person, place, and time.  Psychiatric:        Behavior: Behavior normal.      Vitals:   10/17/22 1318  BP: 120/66  Pulse: (!) 106  SpO2: 96%  Weight: 114 lb 9.6 oz (52 kg)  Height: 5\' 3"  (1.6 m)   96% on RA  BMI Readings from Last 3 Encounters:  10/17/22 20.30 kg/m  10/14/22 20.12 kg/m  10/05/22 20.50 kg/m   Wt Readings from Last 3 Encounters:  10/17/22 114 lb 9.6 oz (52 kg)  10/14/22 113 lb 9.6 oz (51.5 kg)  10/05/22 115 lb 11.9 oz (52.5 kg)     CBC    Component Value Date/Time   WBC 7.0 10/02/2022 1833   RBC 3.64 (L) 10/02/2022 1833   HGB 11.3 (L) 10/02/2022 1833   HGB 13.9 07/17/2016 1029   HCT 31.8 (L) 10/02/2022 1833   HCT 41.7 07/17/2016 1029   PLT 335 10/02/2022 1833   PLT 387 (H) 07/17/2016 1029   MCV 87.4 10/02/2022 1833   MCV 89 07/17/2016 1029   MCH 31.0 10/02/2022 1833   MCHC 35.5 10/02/2022 1833   RDW 13.9 10/02/2022 1833   RDW 14.7 07/17/2016 1029   LYMPHSABS 0.7 10/02/2022 1833   LYMPHSABS 1.3 07/17/2016 1029   MONOABS 0.6 10/02/2022 1833   EOSABS 0.1 10/02/2022 1833   EOSABS 0.2 07/17/2016 1029   BASOSABS 0.0 10/02/2022 1833   BASOSABS  0.1 07/17/2016 1029    Chest Imaging: July 2020 CT chest bilateral lower lobe bronchiectasis.  Pulmonary Functions Testing Results:    Latest Ref Rng & Units 08/26/2018   10:51 AM 07/12/2015    3:52 PM 04/28/2013   11:58 AM  PFT Results  FVC-Pre L 2.27  P 2.29  2.34   FVC-Predicted Pre % 112  P 106  103   FVC-Post L 2.29  P 2.39  2.33   FVC-Predicted Post % 113  P 110  103   Pre FEV1/FVC % % 75  P 68  69   Post FEV1/FCV % % 76  P 74  70   FEV1-Pre L 1.70  P 1.57  1.63   FEV1-Predicted Pre % 114  P 99  97   FEV1-Post L 1.74  P 1.76  1.63   DLCO uncorrected ml/min/mmHg 12.50  P 12.09  12.65   DLCO UNC% % 71  P 52  55   DLVA Predicted % 89  P 70  67   TLC L 4.86  P  4.51   TLC % Predicted % 99  P  92   RV % Predicted % 107  P  76     P Preliminary result    Assessment & Plan:     ICD-10-CM   1. Bronchiectasis without acute exacerbation (HCC)  J47.9     2. Paroxysmal atrial fibrillation (HCC)  I48.0     3. Bronchiectasis without complication (HCC)  J47.9     4. Diastolic dysfunction  I51.89       Assessment:   This is a 89 year old female with chronic bronchiectasis, COPD ILD.  Mixed bronchiectasis with no exacerbation.  Has dyspnea on exertion is multifactorial.  Use flutter valve to help mobilize secretions.  Unable to do CPT.  At some point she may need vest therapy but this did not work because she is planned to have shoulder surgery in the past.  Plan: Continue Trelegy Continue airway clearance techniques Can follow-up with [place] in 6 months or as needed Has been seen by TP, NP in the past.    Current Outpatient Medications:    acetaminophen (TYLENOL) 500 MG tablet, Take 1,000 mg by mouth 2 (two) times daily as needed for moderate pain or headache., Disp: , Rfl:    albuterol (VENTOLIN HFA) 108 (90 Base) MCG/ACT inhaler, Inhale 2 puffs  into the lungs every 6 (six) hours as needed for wheezing or shortness of breath., Disp: 8 g, Rfl: 6   ALPRAZolam (XANAX) 0.25 MG  tablet, Take 1 tablet (0.25 mg total) by mouth 2 (two) times daily as needed for anxiety., Disp: 60 tablet, Rfl: 5   amiodarone (PACERONE) 200 MG tablet, Take 1 tablet (200 mg total) by mouth daily., Disp: , Rfl:    apixaban (ELIQUIS) 2.5 MG TABS tablet, Take 1 tablet (2.5 mg total) by mouth 2 (two) times daily., Disp: 60 tablet, Rfl: 6   Ascorbic Acid (VITAMIN C PO), Take 500 mg by mouth daily. Reported on 08/08/2015, Disp: , Rfl:    B Complex Vitamins (VITAMIN B COMPLEX PO), Take 1 tablet by mouth daily., Disp: , Rfl:    buPROPion (WELLBUTRIN XL) 150 MG 24 hr tablet, Take 1 tablet (150 mg total) by mouth daily., Disp: 90 tablet, Rfl: 3   calcium carbonate (OS-CAL) 600 MG tablet, Take 600 mg by mouth daily., Disp: , Rfl:    cetirizine (ZYRTEC) 10 MG tablet, Take 10 mg by mouth daily., Disp: , Rfl:    Fluticasone-Umeclidin-Vilant (TRELEGY ELLIPTA) 100-62.5-25 MCG/ACT AEPB, Inhale 1 puff into the lungs daily., Disp: 1 each, Rfl: 5   furosemide (LASIX) 40 MG tablet, Take 1 tablet (40 mg total) by mouth daily., Disp: 90 tablet, Rfl: 3   Glucosamine HCl-MSM (GLUCOSAMINE-MSM PO), Take 2 tablets by mouth daily. , Disp: , Rfl:    nitroGLYCERIN (NITROSTAT) 0.4 MG SL tablet, Place 1 tablet (0.4 mg total) under the tongue every 5 (five) minutes as needed for chest pain., Disp: 25 tablet, Rfl: 3   omeprazole (PRILOSEC) 40 MG capsule, TAKE ONE CAPSULE BY MOUTH DAILY, Disp: 90 capsule, Rfl: 1   potassium chloride (KLOR-CON M) 10 MEQ tablet, Take 1 tablet (10 mEq total) by mouth daily., Disp: 90 tablet, Rfl: 3   sacubitril-valsartan (ENTRESTO) 24-26 MG, Take 1 tablet by mouth 2 (two) times daily., Disp: 60 tablet, Rfl:    simvastatin (ZOCOR) 10 MG tablet, Take 1 tablet (10 mg total) by mouth daily., Disp: 90 tablet, Rfl: 3   traMADol (ULTRAM) 50 MG tablet, Take 1 tablet (50 mg total) by mouth 2 (two) times daily as needed., Disp: 60 tablet, Rfl: 2   Josephine Igo, DO [facility] 10/17/2022  1:46 PM

## 2022-10-22 ENCOUNTER — Telehealth: Payer: Self-pay | Admitting: Adult Health

## 2022-10-22 NOTE — Telephone Encounter (Signed)
Patient had questions about lab work that was ordered for tomorrow. I advised it was ordered by Cardiologist. I read her telephone for that office. She will call them with any questions. NFN

## 2022-10-22 NOTE — Telephone Encounter (Signed)
Patient would like the nurse to call her regarding her test results.  CB# [phone]

## 2022-10-23 ENCOUNTER — Ambulatory Visit: Payer: Medicare Other | Attending: Cardiology

## 2022-10-23 DIAGNOSIS — I5032 Chronic diastolic (congestive) heart failure: Secondary | ICD-10-CM

## 2022-10-24 ENCOUNTER — Telehealth: Payer: Self-pay

## 2022-10-24 ENCOUNTER — Other Ambulatory Visit: Payer: Self-pay

## 2022-10-24 DIAGNOSIS — I5032 Chronic diastolic (congestive) heart failure: Secondary | ICD-10-CM

## 2022-10-24 LAB — BASIC METABOLIC PANEL
BUN/Creatinine Ratio: 21 (ref 12–28)
BUN: 19 mg/dL (ref 10–36)
CO2: 20 mmol/L (ref 20–29)
Calcium: 8.8 mg/dL (ref 8.7–10.3)
Chloride: 91 mmol/L — ABNORMAL LOW (ref 96–106)
Creatinine, Ser: 0.92 mg/dL (ref 0.57–1.00)
Glucose: 123 mg/dL — ABNORMAL HIGH (ref 70–99)
Potassium: 5 mmol/L (ref 3.5–5.2)
Sodium: 125 mmol/L — ABNORMAL LOW (ref 134–144)
eGFR: 58 mL/min/{1.73_m2} — ABNORMAL LOW (ref >59)

## 2022-10-24 NOTE — Telephone Encounter (Signed)
Explained Alexandra Newcomer, PA-C recommendations:-    Ask pt how her swelling is doing? What has her weight done in the last week? -She needs strict fluid restriction to get Na up. -Fluid restrict to less than 1500 mL or 50 ounces (all fluids) -Repeat BMET on Monday 5/20    Patient stated her swelling has decreased, weights 5/10 110.1lb and 5/16 113.7 lb The patient has been notified of the result and verbalized understanding.  All questions (if any) were answered. Alexandra Gowda, LPN 1/61/0960 45:40 AM

## 2022-10-28 ENCOUNTER — Ambulatory Visit: Payer: Medicare Other | Attending: Physician Assistant

## 2022-10-28 ENCOUNTER — Telehealth: Payer: Self-pay | Admitting: Physician Assistant

## 2022-10-28 DIAGNOSIS — I5032 Chronic diastolic (congestive) heart failure: Secondary | ICD-10-CM

## 2022-10-28 NOTE — Telephone Encounter (Signed)
Pt would like to know if she is able to stop by office this afternoon to get a copy of her medication list. Please advise

## 2022-10-28 NOTE — Telephone Encounter (Signed)
Left message for patient informing her she may come by office to pick-up a copy of her medication list.  Will leave a copy at front desk for patient to pick-up.  Provided office number to callback if any other questions/needs.

## 2022-10-29 ENCOUNTER — Telehealth: Payer: Self-pay | Admitting: Cardiology

## 2022-10-29 DIAGNOSIS — Z79899 Other long term (current) drug therapy: Secondary | ICD-10-CM

## 2022-10-29 DIAGNOSIS — I10 Essential (primary) hypertension: Secondary | ICD-10-CM

## 2022-10-29 DIAGNOSIS — I480 Paroxysmal atrial fibrillation: Secondary | ICD-10-CM

## 2022-10-29 DIAGNOSIS — I5020 Unspecified systolic (congestive) heart failure: Secondary | ICD-10-CM

## 2022-10-29 LAB — BASIC METABOLIC PANEL
BUN/Creatinine Ratio: 17 (ref 12–28)
BUN: 19 mg/dL (ref 10–36)
CO2: 17 mmol/L — ABNORMAL LOW (ref 20–29)
Calcium: 8.2 mg/dL — ABNORMAL LOW (ref 8.7–10.3)
Chloride: 91 mmol/L — ABNORMAL LOW (ref 96–106)
Creatinine, Ser: 1.12 mg/dL — ABNORMAL HIGH (ref 0.57–1.00)
Glucose: 128 mg/dL — ABNORMAL HIGH (ref 70–99)
Potassium: 4.3 mmol/L (ref 3.5–5.2)
Sodium: 125 mmol/L — ABNORMAL LOW (ref 134–144)
eGFR: 46 mL/min/{1.73_m2} — ABNORMAL LOW (ref >59)

## 2022-10-29 NOTE — Telephone Encounter (Signed)
Spoke with pt who reports her legs are leaking fluid from her ankles up about 4 inches.  Wt was 113.7 on 5/16 and today was 118.6 lbs.  Her medication list  states Furosemide 40 mg daily but she asked at [facility] and they are giving her 60 mg daily.Marland Kitchen  advised to keep legs elevated above the level of her heart/ limit fluid intake to no more than 1,500 ml/day and no added salt.  She states understanding.  Advised I will forward this information to [facility] for his review and will contact her with any new orders.  She was appreciative of the call back and assistance.

## 2022-10-29 NOTE — Telephone Encounter (Signed)
Pt c/o swelling: STAT is pt has developed SOB within 24 hours  If swelling, where is the swelling located? Patient is leaking fluids from her legs  How much weight have you gained and in what time span? N/A  Have you gained 3 pounds in a day or 5 pounds in a week? N/A  Do you have a log of your daily weights (if so, list)? N/A   Are you currently taking a fluid pill? Yes   Are you currently SOB? No   Have you traveled recently? No

## 2022-10-29 NOTE — Telephone Encounter (Signed)
Na is likely low b/c of fluid excess. Increase Lasix from 60 mg daily to 60 mg in AM and 40 mg in afternoon. Repeat BMET 1 week. Schedule f/u within the next week with Dr. Anne Fu or me. Tereso Newcomer, PA-C    10/29/2022 5:10 PM

## 2022-10-29 NOTE — Telephone Encounter (Signed)
Left message for pt to call back to discuss orders. 

## 2022-10-30 NOTE — Telephone Encounter (Signed)
Left  message for patient to callback to discuss med change and schedule lab appt and OV appt with either Tereso Newcomer, PA-C or Dr. Anne Fu next week.  Na is likely low b/c of fluid excess. Increase Lasix from 60 mg daily to 60 mg in AM and 40 mg in afternoon. Repeat BMET 1 week. Schedule f/u within the next week with Dr. Anne Fu or me. Tereso Newcomer, PA-C    10/29/2022 5:10 PM

## 2022-10-30 NOTE — Telephone Encounter (Signed)
Received secure chat message from scheduler that patient was returning call. Unable to take call at the time as I was on another call.  Left message now for patient to callback after 8am tomorrow morning. Will also send recommendations from Tereso Newcomer, PA-C to patient via MyChart message.

## 2022-10-30 NOTE — Telephone Encounter (Signed)
Pt returning call

## 2022-10-30 NOTE — Telephone Encounter (Signed)
Reviewed instructions from [place] with patient who states understanding. She requests [facility] is called with orders at [phone].  Attempted to call nurse at [facility] but she was gone for the day.  A new order will need to be faxed to them before they will change the medication - will not and can not take a verbal order.  Fax # is [phone].  The pharmacy that is used is [facility] in [place], [place].  Any new RX will need to be sent into them.   Spoke with April - med tech at [facility] who reports pt is ordered to take Furosemide 20 mg three times a day.  She can not verify pt has been receiving it TID.  This office will need to contact nurse at [facility] 5/23 to verify the amount of Furosemide pt is actually taking before sending in any new orders.  Will forward this information to both Tereso Newcomer, PA and [facility] triage to follow up on.

## 2022-10-31 NOTE — Telephone Encounter (Signed)
Tried to reach call nurse at [facility]. Unable to leave voicemail

## 2022-10-31 NOTE — Telephone Encounter (Signed)
Have we been able to get the orders for her medication changes to the assisted living facility? Tereso Newcomer, PA-C    10/31/2022 5:27 PM

## 2022-11-01 MED ORDER — POTASSIUM CHLORIDE CRYS ER 10 MEQ PO TBCR: 10.0000 meq | EXTENDED_RELEASE_TABLET | Freq: Every day | ORAL | 3 refills | Status: DC

## 2022-11-01 MED ORDER — FUROSEMIDE 40 MG PO TABS: ORAL_TABLET | ORAL | 3 refills | Status: DC

## 2022-11-01 MED ORDER — FUROSEMIDE 40 MG PO TABS: 60.0000 mg | ORAL_TABLET | Freq: Every morning | ORAL | 3 refills | Status: DC

## 2022-11-01 NOTE — Telephone Encounter (Signed)
Furosemide 40mg  - 1.5 tablets (60mg ) in the morning and 1 tablet (40mg ) tablet in the afternoon and Potassium Chloride - 1 tablet by mouth daily sent to [facility] as requested.  Advised also of daily weights and call for increase of 3 pounds in 24 hours or 5 pounds in 1 week.  BMET in 1 week. Orders faxed to [facility] # is [phone].

## 2022-11-04 NOTE — Progress Notes (Unsigned)
Cardiology Office Note:    Date:  11/05/2022  ID:  LEHA Alexandra Reyes, DOB 1926/05/18, MRN [id number] PCP: Corwin Levins, MD  [facility] Providers Cardiologist:  Donato Schultz, MD Electrophysiologist:  Hillis Range, MD (Inactive)       Patient Profile:      Coronary artery disease Myoview 07/12/15: EF 80, inf-sept scar, no ischemia  CTO of RCA  LHC 07/25/16: pRCA 100, L-R collats, pLAD 20 (HFrEF) heart failure with reduced ejection fraction   TTE 01/03/21: EF 55-60, no RWMA, Gr 1 DD, NL RVSF, NL PASP, AV sclerosis, borderline asc aorta (39 mm) TTE 10/03/2022: EF 20-25, normal RVSF, moderately elevated PASP, RVSP 53.1, moderate LAE, mild RAE, moderate pericardial effusion, mild MR, moderate TR, trivial AI, AV sclerosis, RAP 3 Limited echo 10/03/2022: Small circumferential pericardial effusion, no tamponade  Paroxysmal atrial fibrillation  Supraventricular Tachycardia (AVNRT) S/p RF ablation in 2018 Left Bundle Branch Block  Hypertension  Hyperlipidemia Hyponatremia  Chronic Obstructive Pulmonary Disease  Bronchiectasis  Macular degen Assisted Living: [facility]          History of Present Illness:   Alexandra Reyes is a 89 y.o. female who returns for follow up of CHF.  She was last seen 10/14/2022.  She had improved since her recent hospitalization.  She has continued to have low sodium levels.  She recently noted worsening lower extremity edema with weeping.  Her furosemide dose was increased.  She is here today with her friend.  She brings in a list of her weights.  Her weight did increase up to 120.  It is back down to 111 today by her scales.  Her friend notes that her lower extremity edema is much improved.  She overall does not feel well.  She has felt nauseated.  She has had intermittent diarrhea.  At times, she almost seems confused but is easily redirected.  She has had shortness of breath with activity.  This is unchanged.  She continues to sleep on an incline.  She has not had  chest pain, syncope.  Overall, she feels weak.  Review of Systems  Gastrointestinal:  Negative for hematochezia and melena.  Genitourinary:  Negative for hematuria.    See the HPI    Studies Reviewed:    EKG: NSR, HR 73, left bundle branch block, corrected QTc 461   Risk Assessment/Calculations:    CHA2DS2-VASc Score = 6   This indicates a 9.7% annual risk of stroke. The patient's score is based upon: CHF History: 1 HTN History: 1 Diabetes History: 0 Stroke History: 0 Vascular Disease History: 1 Age Score: 2 Gender Score: 1            Physical Exam:   VS:  BP 110/60   Pulse 84   Ht 5\' 3"  (1.6 m)   Wt 116 lb 6.4 oz (52.8 kg)   LMP  (LMP Unknown)   SpO2 95%   BMI 20.62 kg/m    Wt Readings from Last 3 Encounters:  11/05/22 116 lb 6.4 oz (52.8 kg)  10/17/22 114 lb 9.6 oz (52 kg)  10/14/22 113 lb 9.6 oz (51.5 kg)    Constitutional:      Appearance: Healthy appearance. Not in distress.  Neck:     Vascular: JVD elevated.  Pulmonary:     Breath sounds: No wheezing. Bibasilar Rales present.  Cardiovascular:     Normal rate. Regular rhythm.     Murmurs: There is no murmur.  Edema:    Peripheral  edema present.    Pretibial: bilateral 1+ edema of the pretibial area.    Ankle: bilateral 1+ edema of the ankle. Abdominal:     Palpations: Abdomen is soft.       ASSESSMENT AND PLAN:   HFrEF (heart failure with reduced ejection fraction) (HCC) EF 20-25. ?Tachycardia induced CM (AFib). NYHA III. She remains volume overloaded. Her weight had gone up to 120 lbs but has come back down over the last few days of taking Lasix 60 mg in A and 40 mg in P. Her wt at home today was 111. She was about 109 lbs when she came home from the hospital in 09/2022. She is weak and overall does not feel well. She seems to have some mild confusion at times. She has had a low Na that I have thought was hypervolemic. She also feels warm to the touch but her temp was 98.1 when we checked it today.  She has nausea and intermittent diarrhea. Question if this is related to Amiodarone. Her QTc is stable on EKG today. I considered changing her Entresto to low dose Losartan b/c of low BP. However, the repeat by me is stable. She will be better off if she can remain on Entresto. I do not want to reduce her Lasix as she still shows signs of volume excess. I think she will have worsening volume if I lower her Lasix. Continue Lasix 60 mg in the AM and 40 mg in the PM Continue Entresto 24/26 mg twice daily. BMET, Mg2+ today. If renal function stable and Na stable, consider adding SGLT2i (which may help improve Na) If Na is lower, she will need to go to the ED. Adjust K+ if low. Her QTc is stable so I do not think she has markedly low or high K+ F/u 3-4 weeks with Dr. Anne Fu. Consider repeat echocardiogram at f/u to recheck EF.   Weakness Etiology not entirely clear. Her weakness may be multifactorial (CHF, deconditioning, CAD, AFib). She feels warm like she has a fever. But her temp is normal. She has had a cough with clear sputum production. She has not had dysuria or a rash.  BMET, CBC w diff CXR F/u with PCP if symptoms persist despite optimal CHF management    Paroxysmal atrial fibrillation (HCC) Maintaining NSR by EKG today. She notes nausea and intermittent diarrhea. Question if this is related to Amiodarone. She was started on this in the hospital in April 2024. Her initial dose was Amiodarone 200 mg twice daily in the hospital but she was DC on 200 mg once daily. I think she has been adequately loaded by now (she has had at least 6 grams). I discussed changing her Amiodarone to 100 mg once daily. However, she is concerned about going back into AFib.  Decrease Amiodarone to 200 mg once daily except take 100 mg on Sat and Sun. By age and weight, continue Eliquis 2.5 mg twice daily F/u 4 weeks.   CKD (chronic kidney disease), stage III (HCC) Obtain BMET today. Renal function has been stable.  It may be best to continue her diuresis until her Creatinine starts to increase.     Dispo:  Return in about 4 weeks (around 12/03/2022) for Close Follow Up with Dr. Anne Fu.  Signed, Tereso Newcomer, PA-C

## 2022-11-05 ENCOUNTER — Telehealth: Payer: Self-pay | Admitting: Cardiology

## 2022-11-05 ENCOUNTER — Ambulatory Visit
Admission: RE | Admit: 2022-11-05 | Discharge: 2022-11-05 | Disposition: A | Discharge status: Home / Self Care | Payer: Medicare Other | Source: Ambulatory Visit | Attending: Physician Assistant | Admitting: Physician Assistant

## 2022-11-05 ENCOUNTER — Encounter: Payer: Self-pay | Admitting: Physician Assistant

## 2022-11-05 ENCOUNTER — Ambulatory Visit: Payer: Medicare Other | Attending: Physician Assistant | Admitting: Physician Assistant

## 2022-11-05 VITALS — BP 110/60 | HR 84 | Ht 63.0 in | Wt 116.4 lb

## 2022-11-05 DIAGNOSIS — R0602 Shortness of breath: Secondary | ICD-10-CM | POA: Insufficient documentation

## 2022-11-05 DIAGNOSIS — I480 Paroxysmal atrial fibrillation: Secondary | ICD-10-CM | POA: Diagnosis not present

## 2022-11-05 DIAGNOSIS — N1831 Chronic kidney disease, stage 3a: Secondary | ICD-10-CM | POA: Diagnosis not present

## 2022-11-05 DIAGNOSIS — I5020 Unspecified systolic (congestive) heart failure: Secondary | ICD-10-CM | POA: Diagnosis not present

## 2022-11-05 DIAGNOSIS — R531 Weakness: Secondary | ICD-10-CM | POA: Diagnosis not present

## 2022-11-05 MED ORDER — AMIODARONE HCL 200 MG PO TABS: ORAL_TABLET | ORAL | 3 refills | Status: DC

## 2022-11-05 NOTE — Assessment & Plan Note (Signed)
Etiology not entirely clear. Her weakness may be multifactorial (CHF, deconditioning, CAD, AFib). She feels warm like she has a fever. But her temp is normal. She has had a cough with clear sputum production. She has not had dysuria or a rash.  BMET, CBC w diff CXR F/u with PCP if symptoms persist despite optimal CHF management

## 2022-11-05 NOTE — Assessment & Plan Note (Signed)
Maintaining NSR by EKG today. She notes nausea and intermittent diarrhea. Question if this is related to Amiodarone. She was started on this in the hospital in April 2024. Her initial dose was Amiodarone 200 mg twice daily in the hospital but she was DC on 200 mg once daily. I think she has been adequately loaded by now (she has had at least 6 grams). I discussed changing her Amiodarone to 100 mg once daily. However, she is concerned about going back into AFib.  Decrease Amiodarone to 200 mg once daily except take 100 mg on Sat and Sun. By age and weight, continue Eliquis 2.5 mg twice daily F/u 4 weeks.

## 2022-11-05 NOTE — Assessment & Plan Note (Addendum)
EF 20-25. ?Tachycardia induced CM (AFib). NYHA III. She remains volume overloaded. Her weight had gone up to 120 lbs but has come back down over the last few days of taking Lasix 60 mg in A and 40 mg in P. Her wt at home today was 111. She was about 109 lbs when she came home from the hospital in 09/2022. She is weak and overall does not feel well. She seems to have some mild confusion at times. She has had a low Na that I have thought was hypervolemic. She also feels warm to the touch but her temp was 98.1 when we checked it today. She has nausea and intermittent diarrhea. Question if this is related to Amiodarone. Her QTc is stable on EKG today. I considered changing her Entresto to low dose Losartan b/c of low BP. However, the repeat by me is stable. She will be better off if she can remain on Entresto. I do not want to reduce her Lasix as she still shows signs of volume excess. I think she will have worsening volume if I lower her Lasix. Continue Lasix 60 mg in the AM and 40 mg in the PM Continue Entresto 24/26 mg twice daily. BMET, Mg2+ today. If renal function stable and Na stable, consider adding SGLT2i (which may help improve Na) If Na is lower, she will need to go to the ED. Adjust K+ if low. Her QTc is stable so I do not think she has markedly low or high K+ F/u 3-4 weeks with Dr. Anne Fu. Consider repeat echocardiogram at f/u to recheck EF.

## 2022-11-05 NOTE — Telephone Encounter (Signed)
Pt is on [employer]  schedule today.   Elliot Cousin called and spoke with daughter advising her the orders were faxed to [facility] 5/24 and sent electronically to [facility] as requested. Daughter was advised any changes made as a result of today's office visit will be sent with pt to [facility].

## 2022-11-05 NOTE — Assessment & Plan Note (Signed)
Obtain BMET today. Renal function has been stable. It may be best to continue her diuresis until her Creatinine starts to increase.

## 2022-11-05 NOTE — Telephone Encounter (Signed)
Pt c/o medication issue:  1. Name of Medication:   furosemide (LASIX) 40 MG tablet   2. How are you currently taking this medication (dosage and times per day)? Patient had dosage increased and has been taking as prescribed  3. Are you having a reaction (difficulty breathing--STAT)?   No  4. What is your medication issue?    Daughter stated patient's Lasix was increased due to fluid build up.  Daughter stated patient will need written order that medication was increased to be provided to [facility] nurse.  Fax written order to nurse fax# [phone].  Daughter wants a call back to discuss patient's care.

## 2022-11-05 NOTE — Patient Instructions (Signed)
Medication Instructions:  Your physician has recommended you make the following change in your medication:   REDUCE the Amiodarone to 200 mg taking 1 daily MONDAY-FRIDAY, TAKE 1/2 TABLET ON SATURDAY & SUNDAY   *If you need a refill on your cardiac medications before your next appointment, please call your pharmacy*   Lab Work: TODAY:  BMET, CBC W/DIFF, & MAG  If you have labs (blood work) drawn today and your tests are completely normal, you will receive your results only by: MyChart Message (if you have MyChart) OR A paper copy in the mail If you have any lab test that is abnormal or we need to change your treatment, we will call you to review the results.   Testing/Procedures: A chest x-ray takes a picture of the organs and structures inside the chest, including the heart, lungs, and blood vessels. This test can show several things, including, whether the heart is enlarges; whether fluid is building up in the lungs; and whether pacemaker / defibrillator leads are still in place. GO TO [facility] Presume [address]  [phone]   Follow-Up: At [facility], you and your health needs are our priority.  As part of our continuing mission to provide you with exceptional heart care, we have created designated Provider Care Teams.  These Care Teams include your primary Cardiologist (physician) and Advanced Practice Providers (APPs -  Physician Assistants and Nurse Practitioners) who all work together to provide you with the care you need, when you need it.  We recommend signing up for the patient portal called "MyChart".  Sign up information is provided on this After Visit Summary.  MyChart is used to connect with patients for Virtual Visits (Telemedicine).  Patients are able to view lab/test results, encounter notes, upcoming appointments, etc.  Non-urgent messages can be sent to your provider as well.   To learn more about what you can do with MyChart, go to  [email or web address].    Your next appointment:   4 week(s)  Provider:   Donato Schultz, MD     Other Instructions

## 2022-11-06 ENCOUNTER — Telehealth: Payer: Self-pay | Admitting: Cardiology

## 2022-11-06 DIAGNOSIS — I5020 Unspecified systolic (congestive) heart failure: Secondary | ICD-10-CM

## 2022-11-06 LAB — BASIC METABOLIC PANEL
BUN/Creatinine Ratio: 22 (ref 12–28)
BUN: 23 mg/dL (ref 10–36)
CO2: 22 mmol/L (ref 20–29)
Calcium: 8.6 mg/dL — ABNORMAL LOW (ref 8.7–10.3)
Chloride: 93 mmol/L — ABNORMAL LOW (ref 96–106)
Creatinine, Ser: 1.03 mg/dL — ABNORMAL HIGH (ref 0.57–1.00)
Glucose: 97 mg/dL (ref 70–99)
Potassium: 4.4 mmol/L (ref 3.5–5.2)
Sodium: 129 mmol/L — ABNORMAL LOW (ref 134–144)
eGFR: 50 mL/min/{1.73_m2} — ABNORMAL LOW (ref >59)

## 2022-11-06 LAB — CBC WITH DIFFERENTIAL/PLATELET
Basophils Absolute: 0.1 10*3/uL (ref 0.0–0.2)
Basos: 1 %
EOS (ABSOLUTE): 0.2 10*3/uL (ref 0.0–0.4)
Eos: 2 %
Hematocrit: 33.5 % — ABNORMAL LOW (ref 34.0–46.6)
Hemoglobin: 11.1 g/dL (ref 11.1–15.9)
Immature Grans (Abs): 0 10*3/uL (ref 0.0–0.1)
Immature Granulocytes: 0 %
Lymphocytes Absolute: 0.5 10*3/uL — ABNORMAL LOW (ref 0.7–3.1)
Lymphs: 7 %
MCH: 29.6 pg (ref 26.6–33.0)
MCHC: 33.1 g/dL (ref 31.5–35.7)
MCV: 89 fL (ref 79–97)
Monocytes Absolute: 0.8 10*3/uL (ref 0.1–0.9)
Monocytes: 10 %
Neutrophils Absolute: 6 10*3/uL (ref 1.4–7.0)
Neutrophils: 80 %
Platelets: 348 10*3/uL (ref 150–450)
RBC: 3.75 x10E6/uL — ABNORMAL LOW (ref 3.77–5.28)
RDW: 13.3 % (ref 11.7–15.4)
WBC: 7.4 10*3/uL (ref 3.4–10.8)

## 2022-11-06 LAB — MAGNESIUM: Magnesium: 1.8 mg/dL (ref 1.6–2.3)

## 2022-11-06 NOTE — Addendum Note (Signed)
Addended by: Burnetta Sabin on: 11/06/2022 10:26 AM   Modules accepted: Orders

## 2022-11-06 NOTE — Telephone Encounter (Signed)
Spoke with patient she is aware of lab results and provider recommendations. She denies frequent bladder infections or urinary incontinence and she states she has good hygiene. She will come repeat BMET in one week. BMET ordered.

## 2022-11-06 NOTE — Telephone Encounter (Signed)
Patient is returning call. She is hopeful for a call back soon because she is in assisted living and she goes to dinner at 5:00 PM.

## 2022-11-08 NOTE — Telephone Encounter (Signed)
Left message for the patient to call the clinic.

## 2022-11-08 NOTE — Telephone Encounter (Signed)
Her recent labs were improved. I think her sodium would improve further with an SGLT2i. Since she does not have a hx of frequent UTIs or incontinence, it is reasonable to try. If she agrees to start, she would need to keep an eye out for any bladder infection symptoms. The likelihood of this is extremely rare, but she needs to know about it. These are very good medications to manage congestive heart failure and should make her feel better.  PLAN:  -If willing, start Jardiance 10 mg once daily. -If she starts Jardiance, I would like her to reduce Lasix to 40 mg twice daily. -Plan repeat BMET 1 week after med changes. -FYI, we have had significant trouble getting medications changed at her facility. If needed, we can wait until Monday when I am back and can sign a paper order to fax over. Tereso Newcomer, PA-C    11/08/2022 12:04 PM

## 2022-11-13 ENCOUNTER — Telehealth: Payer: Self-pay | Admitting: Internal Medicine

## 2022-11-14 ENCOUNTER — Ambulatory Visit: Payer: Medicare Other

## 2022-11-15 NOTE — Telephone Encounter (Signed)
Notified by Dr. Anne Fu RN that patient has CTB.  Will send note to medical records for verification/mark chart.

## 2022-11-18 ENCOUNTER — Telehealth: Payer: Self-pay | Admitting: Adult Health

## 2022-11-18 NOTE — Telephone Encounter (Signed)
Pt daughter states that she passed on 2022-12-10, daughter states that her mother had a vest that she is not wanting just to throw away in case someone else could use it.   Dr.Icard please advise daughter states that she would bring the vest to the office if you feel someone else could benefit from it. Daughter is leaving to go home on 6/12

## 2022-11-18 NOTE — Telephone Encounter (Signed)
Pt daughter called in bc her mother has passed away. Pt has [employer]oo Vent compression like vest and wants to know if she can give it to [place] or should she donate it elsewhere ?

## 2022-12-03 ENCOUNTER — Ambulatory Visit: Payer: Medicare Other | Admitting: Cardiology

## 2022-12-09 NOTE — Telephone Encounter (Signed)
Desiree from [employer] states the patient expired this morning at 8:15. Chip Boer will be sending documentation through the faxes.   Please call Chip Boer for any additional info: [phone]

## 2022-12-09 DEATH — deceased

## 2023-01-13 ENCOUNTER — Ambulatory Visit: Payer: Medicare Other | Admitting: Internal Medicine

## 2023-04-21 ENCOUNTER — Ambulatory Visit: Payer: Medicare Other | Admitting: Adult Health
